# Patient Record
Sex: Male | Born: 1945 | Race: White | Hispanic: No | Marital: Single | Smoking: Never smoker
Health system: Southern US, Community
[De-identification: ages and names within clinical notes are randomized; demographics above are authoritative.]

## PROBLEM LIST (undated history)

## (undated) DIAGNOSIS — H269 Unspecified cataract: Secondary | ICD-10-CM

## (undated) DIAGNOSIS — I5032 Chronic diastolic (congestive) heart failure: Secondary | ICD-10-CM

## (undated) DIAGNOSIS — M2550 Pain in unspecified joint: Secondary | ICD-10-CM

## (undated) DIAGNOSIS — E039 Hypothyroidism, unspecified: Secondary | ICD-10-CM

## (undated) DIAGNOSIS — F329 Major depressive disorder, single episode, unspecified: Secondary | ICD-10-CM

## (undated) DIAGNOSIS — I480 Paroxysmal atrial fibrillation: Secondary | ICD-10-CM

## (undated) DIAGNOSIS — K5790 Diverticulosis of intestine, part unspecified, without perforation or abscess without bleeding: Secondary | ICD-10-CM

## (undated) DIAGNOSIS — I2510 Atherosclerotic heart disease of native coronary artery without angina pectoris: Secondary | ICD-10-CM

## (undated) DIAGNOSIS — K5792 Diverticulitis of intestine, part unspecified, without perforation or abscess without bleeding: Secondary | ICD-10-CM

## (undated) DIAGNOSIS — Z86010 Personal history of colonic polyps: Secondary | ICD-10-CM

## (undated) DIAGNOSIS — E785 Hyperlipidemia, unspecified: Secondary | ICD-10-CM

## (undated) DIAGNOSIS — K209 Esophagitis, unspecified without bleeding: Secondary | ICD-10-CM

## (undated) DIAGNOSIS — G4700 Insomnia, unspecified: Secondary | ICD-10-CM

## (undated) DIAGNOSIS — Z860101 Personal history of adenomatous and serrated colon polyps: Secondary | ICD-10-CM

## (undated) DIAGNOSIS — Z8673 Personal history of transient ischemic attack (TIA), and cerebral infarction without residual deficits: Secondary | ICD-10-CM

## (undated) DIAGNOSIS — I10 Essential (primary) hypertension: Secondary | ICD-10-CM

## (undated) DIAGNOSIS — K259 Gastric ulcer, unspecified as acute or chronic, without hemorrhage or perforation: Secondary | ICD-10-CM

## (undated) DIAGNOSIS — R7303 Prediabetes: Secondary | ICD-10-CM

## (undated) DIAGNOSIS — N400 Enlarged prostate without lower urinary tract symptoms: Secondary | ICD-10-CM

## (undated) DIAGNOSIS — M2540 Effusion, unspecified joint: Secondary | ICD-10-CM

## (undated) DIAGNOSIS — K219 Gastro-esophageal reflux disease without esophagitis: Secondary | ICD-10-CM

## (undated) DIAGNOSIS — F419 Anxiety disorder, unspecified: Secondary | ICD-10-CM

## (undated) DIAGNOSIS — G4733 Obstructive sleep apnea (adult) (pediatric): Secondary | ICD-10-CM

## (undated) DIAGNOSIS — M1990 Unspecified osteoarthritis, unspecified site: Secondary | ICD-10-CM

## (undated) DIAGNOSIS — F32A Depression, unspecified: Secondary | ICD-10-CM

## (undated) HISTORY — PX: CARDIAC CATHETERIZATION: SHX172

## (undated) HISTORY — DX: Esophagitis, unspecified: K20.9

## (undated) HISTORY — DX: Personal history of adenomatous and serrated colon polyps: Z86.0101

## (undated) HISTORY — PX: EYE SURGERY: SHX253

## (undated) HISTORY — DX: Unspecified cataract: H26.9

## (undated) HISTORY — PX: CERVICAL SPINE SURGERY: SHX589

## (undated) HISTORY — DX: Personal history of transient ischemic attack (TIA), and cerebral infarction without residual deficits: Z86.73

## (undated) HISTORY — PX: TONSILLECTOMY: SUR1361

## (undated) HISTORY — PX: FOREIGN BODY REMOVAL ESOPHAGEAL: SHX5322

## (undated) HISTORY — DX: Major depressive disorder, single episode, unspecified: F32.9

## (undated) HISTORY — PX: OTHER SURGICAL HISTORY: SHX169

## (undated) HISTORY — DX: Gastric ulcer, unspecified as acute or chronic, without hemorrhage or perforation: K25.9

## (undated) HISTORY — DX: Diverticulosis of intestine, part unspecified, without perforation or abscess without bleeding: K57.90

## (undated) HISTORY — DX: Hyperlipidemia, unspecified: E78.5

## (undated) HISTORY — PX: POLYPECTOMY: SHX149

## (undated) HISTORY — DX: Esophagitis, unspecified without bleeding: K20.90

## (undated) HISTORY — PX: KNEE SURGERY: SHX244

## (undated) HISTORY — DX: Diverticulitis of intestine, part unspecified, without perforation or abscess without bleeding: K57.92

## (undated) HISTORY — DX: Depression, unspecified: F32.A

## (undated) HISTORY — PX: COLONOSCOPY: SHX174

## (undated) HISTORY — DX: Personal history of colonic polyps: Z86.010

## (undated) HISTORY — PX: NASAL SINUS SURGERY: SHX719

---

## 1995-12-08 ENCOUNTER — Encounter: Payer: Self-pay | Admitting: Internal Medicine

## 1998-02-23 ENCOUNTER — Encounter: Payer: Self-pay | Admitting: Internal Medicine

## 1998-02-27 ENCOUNTER — Other Ambulatory Visit: Admission: RE | Admit: 1998-02-27 | Discharge: 1998-02-27 | Payer: Self-pay | Admitting: Internal Medicine

## 1998-02-27 ENCOUNTER — Encounter: Payer: Self-pay | Admitting: Internal Medicine

## 1998-04-02 ENCOUNTER — Ambulatory Visit (HOSPITAL_COMMUNITY): Admission: RE | Admit: 1998-04-02 | Discharge: 1998-04-02 | Payer: Self-pay | Admitting: *Deleted

## 2001-03-06 ENCOUNTER — Encounter: Admission: RE | Admit: 2001-03-06 | Discharge: 2001-03-06 | Payer: Self-pay | Admitting: Otolaryngology

## 2001-03-06 ENCOUNTER — Encounter: Payer: Self-pay | Admitting: Otolaryngology

## 2001-04-04 ENCOUNTER — Emergency Department (HOSPITAL_COMMUNITY): Admission: EM | Admit: 2001-04-04 | Discharge: 2001-04-04 | Payer: Self-pay

## 2001-06-18 ENCOUNTER — Ambulatory Visit (HOSPITAL_BASED_OUTPATIENT_CLINIC_OR_DEPARTMENT_OTHER): Admission: RE | Admit: 2001-06-18 | Discharge: 2001-06-18 | Payer: Self-pay | Admitting: Orthopedic Surgery

## 2001-12-17 ENCOUNTER — Ambulatory Visit (HOSPITAL_BASED_OUTPATIENT_CLINIC_OR_DEPARTMENT_OTHER): Admission: RE | Admit: 2001-12-17 | Discharge: 2001-12-17 | Payer: Self-pay | Admitting: Surgery

## 2002-09-04 ENCOUNTER — Ambulatory Visit (HOSPITAL_COMMUNITY): Admission: RE | Admit: 2002-09-04 | Discharge: 2002-09-04 | Payer: Self-pay | Admitting: Specialist

## 2002-10-30 ENCOUNTER — Encounter: Payer: Self-pay | Admitting: Internal Medicine

## 2003-03-07 ENCOUNTER — Inpatient Hospital Stay (HOSPITAL_COMMUNITY): Admission: RE | Admit: 2003-03-07 | Discharge: 2003-03-09 | Payer: Self-pay | Admitting: Neurosurgery

## 2003-08-14 ENCOUNTER — Encounter: Payer: Self-pay | Admitting: Internal Medicine

## 2003-08-14 ENCOUNTER — Inpatient Hospital Stay (HOSPITAL_COMMUNITY): Admission: EM | Admit: 2003-08-14 | Discharge: 2003-08-17 | Payer: Self-pay | Admitting: Internal Medicine

## 2003-08-14 ENCOUNTER — Encounter (INDEPENDENT_AMBULATORY_CARE_PROVIDER_SITE_OTHER): Payer: Self-pay | Admitting: *Deleted

## 2003-09-09 ENCOUNTER — Encounter (INDEPENDENT_AMBULATORY_CARE_PROVIDER_SITE_OTHER): Payer: Self-pay | Admitting: *Deleted

## 2003-09-24 ENCOUNTER — Ambulatory Visit (HOSPITAL_COMMUNITY): Admission: RE | Admit: 2003-09-24 | Discharge: 2003-09-24 | Payer: Self-pay | Admitting: Specialist

## 2003-10-23 ENCOUNTER — Encounter: Payer: Self-pay | Admitting: Internal Medicine

## 2005-02-05 ENCOUNTER — Encounter: Payer: Self-pay | Admitting: Internal Medicine

## 2005-06-23 ENCOUNTER — Emergency Department (HOSPITAL_COMMUNITY): Admission: EM | Admit: 2005-06-23 | Discharge: 2005-06-23 | Payer: Self-pay | Admitting: Emergency Medicine

## 2006-03-14 ENCOUNTER — Ambulatory Visit: Payer: Self-pay | Admitting: Internal Medicine

## 2006-03-28 ENCOUNTER — Encounter (INDEPENDENT_AMBULATORY_CARE_PROVIDER_SITE_OTHER): Payer: Self-pay | Admitting: Specialist

## 2006-03-28 ENCOUNTER — Ambulatory Visit: Payer: Self-pay | Admitting: Internal Medicine

## 2006-03-28 ENCOUNTER — Encounter (INDEPENDENT_AMBULATORY_CARE_PROVIDER_SITE_OTHER): Payer: Self-pay | Admitting: *Deleted

## 2007-01-28 ENCOUNTER — Observation Stay (HOSPITAL_COMMUNITY): Admission: EM | Admit: 2007-01-28 | Discharge: 2007-01-29 | Payer: Self-pay | Admitting: Emergency Medicine

## 2007-01-29 ENCOUNTER — Encounter (INDEPENDENT_AMBULATORY_CARE_PROVIDER_SITE_OTHER): Payer: Self-pay | Admitting: Neurology

## 2007-06-11 ENCOUNTER — Telehealth: Payer: Self-pay | Admitting: Internal Medicine

## 2008-01-26 ENCOUNTER — Telehealth: Payer: Self-pay | Admitting: Gastroenterology

## 2008-11-03 ENCOUNTER — Telehealth: Payer: Self-pay | Admitting: Internal Medicine

## 2008-11-12 ENCOUNTER — Telehealth: Payer: Self-pay | Admitting: Internal Medicine

## 2008-11-13 ENCOUNTER — Ambulatory Visit: Payer: Self-pay | Admitting: Internal Medicine

## 2008-11-13 DIAGNOSIS — K5732 Diverticulitis of large intestine without perforation or abscess without bleeding: Secondary | ICD-10-CM | POA: Insufficient documentation

## 2008-11-13 DIAGNOSIS — Z860100 Personal history of colon polyps, unspecified: Secondary | ICD-10-CM | POA: Insufficient documentation

## 2008-11-13 DIAGNOSIS — R109 Unspecified abdominal pain: Secondary | ICD-10-CM | POA: Insufficient documentation

## 2008-11-13 DIAGNOSIS — K921 Melena: Secondary | ICD-10-CM | POA: Insufficient documentation

## 2008-11-14 ENCOUNTER — Encounter: Payer: Self-pay | Admitting: Internal Medicine

## 2008-11-14 ENCOUNTER — Ambulatory Visit: Payer: Self-pay | Admitting: Internal Medicine

## 2008-11-14 DIAGNOSIS — K2970 Gastritis, unspecified, without bleeding: Secondary | ICD-10-CM | POA: Insufficient documentation

## 2008-11-14 DIAGNOSIS — K2990 Gastroduodenitis, unspecified, without bleeding: Secondary | ICD-10-CM

## 2008-11-14 LAB — CONVERTED CEMR LAB: UREASE: NEGATIVE

## 2008-11-19 ENCOUNTER — Telehealth (INDEPENDENT_AMBULATORY_CARE_PROVIDER_SITE_OTHER): Payer: Self-pay | Admitting: *Deleted

## 2009-01-14 ENCOUNTER — Ambulatory Visit: Payer: Self-pay | Admitting: Internal Medicine

## 2009-01-14 DIAGNOSIS — K259 Gastric ulcer, unspecified as acute or chronic, without hemorrhage or perforation: Secondary | ICD-10-CM | POA: Insufficient documentation

## 2009-01-14 DIAGNOSIS — K219 Gastro-esophageal reflux disease without esophagitis: Secondary | ICD-10-CM | POA: Insufficient documentation

## 2009-03-06 ENCOUNTER — Telehealth: Payer: Self-pay | Admitting: Internal Medicine

## 2009-03-17 ENCOUNTER — Encounter (INDEPENDENT_AMBULATORY_CARE_PROVIDER_SITE_OTHER): Payer: Self-pay | Admitting: *Deleted

## 2009-09-30 ENCOUNTER — Encounter (INDEPENDENT_AMBULATORY_CARE_PROVIDER_SITE_OTHER): Payer: Self-pay | Admitting: *Deleted

## 2009-10-14 ENCOUNTER — Encounter (INDEPENDENT_AMBULATORY_CARE_PROVIDER_SITE_OTHER): Payer: Self-pay | Admitting: *Deleted

## 2009-10-15 ENCOUNTER — Ambulatory Visit: Payer: Self-pay | Admitting: Internal Medicine

## 2009-10-23 ENCOUNTER — Ambulatory Visit: Payer: Self-pay | Admitting: Internal Medicine

## 2009-10-27 ENCOUNTER — Encounter: Payer: Self-pay | Admitting: Internal Medicine

## 2010-02-17 ENCOUNTER — Telehealth: Payer: Self-pay | Admitting: Internal Medicine

## 2010-03-11 NOTE — Letter (Signed)
Summary: [facility] Instructions  [facility]  [address] [place], [place] [address]   Phone: [phone]  Fax: [phone]       Carl [name]    1945/08/10    MRN: [id number]        Procedure Day /Date:  Friday 10/23/2009     Arrival Time:  8:00 am      Procedure Time: 9:00 am     Location of Procedure:                    _x _  [facility] (4th Floor)                        PREPARATION FOR COLONOSCOPY WITH MOVIPREP   Starting 5 days prior to your procedure Sunday 9/11 do not eat nuts, seeds, popcorn, corn, beans, peas,  salads, or any raw vegetables.  Do not take any fiber supplements (e.g. Metamucil, Citrucel, and Benefiber).  THE DAY BEFORE YOUR PROCEDURE         DATE: Thursday 9/15  1.  Drink clear liquids the entire day-NO SOLID FOOD  2.  Do not drink anything colored red or purple.  Avoid juices with pulp.  No orange juice.  3.  Drink at least 64 oz. (8 glasses) of fluid/clear liquids during the day to prevent dehydration and help the prep work efficiently.  CLEAR LIQUIDS INCLUDE: Water Jello Ice Popsicles Tea (sugar ok, no milk/cream) Powdered fruit flavored drinks Coffee (sugar ok, no milk/cream) Gatorade Juice: apple, white grape, white cranberry  Lemonade Clear bullion, consomm, broth Carbonated beverages (any kind) Strained chicken noodle soup Hard Candy                             4.  In the morning, mix first dose of MoviPrep solution:    Empty 1 Pouch A and 1 Pouch B into the disposable container    Add lukewarm drinking water to the top line of the container. Mix to dissolve    Refrigerate (mixed solution should be used within 24 hrs)  5.  Begin drinking the prep at 5:00 p.m. The MoviPrep container is divided by 4 marks.   Every 15 minutes drink the solution down to the next mark (approximately 8 oz) until the full liter is complete.   6.  Follow completed prep with 16 oz of clear liquid of your choice (Nothing red  or purple).  Continue to drink clear liquids until bedtime.  7.  Before going to bed, mix second dose of MoviPrep solution:    Empty 1 Pouch A and 1 Pouch B into the disposable container    Add lukewarm drinking water to the top line of the container. Mix to dissolve    Refrigerate  THE DAY OF YOUR PROCEDURE      DATE: Friday 9/16  Beginning at 4:00 a.m. (5 hours before procedure):         1. Every 15 minutes, drink the solution down to the next mark (approx 8 oz) until the full liter is complete.  2. Follow completed prep with 16 oz. of clear liquid of your choice.    3. You may drink clear liquids until 7:00 am (2 HOURS BEFORE PROCEDURE).   MEDICATION INSTRUCTIONS  Unless otherwise instructed, you should take regular prescription medications with a small sip of water   as early as possible the morning  of your procedure.          OTHER INSTRUCTIONS  You will need a responsible adult at least 66 years of age to accompany you and drive you home.   This person must remain in the waiting room during your procedure.  Wear loose fitting clothing that is easily removed.  Leave jewelry and other valuables at home.  However, you may wish to bring a book to read or  an iPod/MP3 player to listen to music as you wait for your procedure to start.  Remove all body piercing jewelry and leave at home.  Total time from sign-in until discharge is approximately 2-3 hours.  You should go home directly after your procedure and rest.  You can resume normal activities the  day after your procedure.  The day of your procedure you should not:   Drive   Make legal decisions   Operate machinery   Drink alcohol   Return to work  You will receive specific instructions about eating, activities and medications before you leave.    The above instructions have been reviewed and explained to me by   Ezra Sites RN  October 15, 2009 5:06 PM     I fully understand and can  verbalize these instructions _____________________________ Date _________

## 2010-03-11 NOTE — Letter (Signed)
Summary: Previsit letter  [facility]  [address] [place], [place] [address]   Phone: [phone]  Fax: [phone]       09/30/2009 MRN: [id number]  Carl [name] [address] [place], [place]  [address]                                                     Procedure Date: 10/23/2009   Dear [name],  Welcome to the Gastroenterology Division at [facility].    You are scheduled to see a nurse for your pre-procedure visit on 10/15/2009 at 4:30pm on the 3rd floor at [place], [address]  We ask that you try to arrive at our office 15 minutes prior to your appointment time to allow for check-in.  Your nurse visit will consist of discussing your medical and surgical history, your immediate family medical history, and your medications.    Please bring a complete list of all your medications or, if you prefer, bring the medication bottles and we will list them.  We will need to be aware of both prescribed and over the counter drugs.  We will need to know exact dosage information as well.  If you are on blood thinners (Coumadin, Plavix, Aggrenox, Ticlid, etc.) please call our office today/prior to your appointment, as we need to consult with your physician about holding your medication.   Please be prepared to read and sign documents such as consent forms, a financial agreement, and acknowledgement forms.  If necessary, and with your consent, a friend or relative is welcome to sit-in on the nurse visit with you.  Please bring your insurance card so that we may make a copy of it.  If your insurance requires a referral to see a specialist, please bring your referral form from your primary care physician.  No co-pay is required for this nurse visit.     If you cannot keep your appointment, please call [phone] to cancel or reschedule prior to your appointment date.  This allows [place] the opportunity to schedule an appointment for another patient in need of care.     Thank you for choosing [facility] for your medical needs.  We appreciate the opportunity to care for you.  Please visit [place] at our website  to learn more about our practice.                     Sincerely.                                                                                                                   The Gastroenterology Division

## 2010-03-11 NOTE — Progress Notes (Signed)
Summary: Divertic.. flare  Phone Note Call from Patient Call back at Home Phone [phone]   Caller: Patient Call For: Dr. Marina Goodell Reason for Call: Talk to Nurse Summary of Call: Pt has some concerns he would like to disucss with the nurse Initial call taken by: [place] Johnson,  February 17, 2010 9:52 AM  Follow-up for Phone Call        Pt. had L.L.Q. pain on Saturday was initially a 10 but then became dull so he started his Flagyl 500 mg b.i.d but only had enough for 5 days.He also went on clear liquids x one and a half days.Has been pain free but is asking if he should get more to complete a full course. Follow-up by: Teryl Lucy RN,  February 17, 2010 11:45 AM  Additional Follow-up for Phone Call Additional follow up Details #1::        Yes. Cipro 500 mg x 1 week and Flagyl 500mg  x 1 week. Additional Follow-up by: Hilarie Fredrickson MD,  February 17, 2010 11:53 AM    Additional Follow-up for Phone Call Additional follow up Details #2::    Message left to call back.   Teryl Lucy RN  February 17, 2010 2:06 PM Pt. ntfd. of Dr.Wenona [place]ville's orders and rx. sent to pharmacy. Follow-up by: Teryl Lucy RN,  February 17, 2010 3:08 PM  New/Updated Medications: CIPROFLOXACIN HCL 500 MG TABS (CIPROFLOXACIN HCL) Take 1 p.o.b.i.d. for i week METRONIDAZOLE 500 MG TABS (METRONIDAZOLE) Take one b.i.d. for one week PRILOSEC 20 MG CPDR (OMEPRAZOLE) Take one p.o. one half  before breakfast Prescriptions: PRILOSEC 20 MG CPDR (OMEPRAZOLE) Take one p.o. one half  before breakfast  #30 x 11   Entered by:   Teryl Lucy RN   Authorized by:   Hilarie Fredrickson MD   Signed by:   Teryl Lucy RN on 02/17/2010   Method used:   [facility] [address] [phone]* (retail)       [address]       [place], [place]  [address]       Ph: [phone] or [phone]       Fax: [phone]   RxID:   [id number] METRONIDAZOLE 500 MG TABS (METRONIDAZOLE) Take one b.i.d. for one week   #14 x 0   Entered by:   Teryl Lucy RN   Authorized by:   Hilarie Fredrickson MD   Signed by:   Teryl Lucy RN on 02/17/2010   Method used:   [facility]        [facility] [address] [phone]* (retail)       [address]       [place], [place]  [address]       Ph: [phone] or [phone]       Fax: [phone]   RxID:   [id number] CIPROFLOXACIN HCL 500 MG TABS (CIPROFLOXACIN HCL) Take 1 p.o.b.i.d. for i week  #14 x 0   Entered by:   Teryl Lucy RN   Authorized by:   Hilarie Fredrickson MD   Signed by:   Teryl Lucy RN on 02/17/2010   Method used:   [facility]        [facility] [address] [phone]* (retail)       [address]       [place]  [place], [place]  [address]       Ph: [phone] or [phone]       Fax: [phone]   RxID:   [id number]

## 2010-03-11 NOTE — Letter (Signed)
Summary: Colonoscopy Letter  [facility]  [address] [place], [place] [address]   Phone: [phone]  Fax: [phone]      March 17, 2009 MRN: [id number]   JED KUTCH [address] [place], [place]  [address]   Dear Mr. HARNISH,   According to your medical record, it is time for you to schedule a Colonoscopy. The American Cancer Society recommends this procedure as a method to detect early colon cancer. Patients with a family history of colon cancer, or a personal history of colon polyps or inflammatory bowel disease are at increased risk.  This letter has beeen generated based on the recommendations made at the time of your procedure. If you feel that in your particular situation this may no longer apply, please contact our office.  Please call our office at [phone] to schedule this appointment or to update your records at your earliest convenience.  Thank you for cooperating with [place] to provide you with the very best care possible.   Sincerely,  Wilhemina Bonito. Marina Goodell, M.D.  [facility] Gastroenterology Division [phone]

## 2010-03-11 NOTE — Letter (Signed)
Summary: Patient Notice- Polyp Results  [facility]  [address] [place], [place] [address]   Phone: [phone]  Fax: [phone]        October 27, 2009 MRN: [id number]    Carl [name] [address] [place], [place]  [address]    Dear [name],  I am pleased to inform you that the colon polyp(s) removed during your recent colonoscopy was (were) found to be benign (no cancer detected) upon pathologic examination.  I recommend you have a repeat colonoscopy examination in 5 years to look for recurrent polyps, as having colon polyps increases your risk for having recurrent polyps or even colon cancer in the future.  Should you develop new or worsening symptoms of abdominal pain, bowel habit changes or bleeding from the rectum or bowels, please schedule an evaluation with either your primary care physician or with me.  Additional information/recommendations:  __ No further action with gastroenterology is needed at this time. Please      follow-up with your primary care physician for your other healthcare      needs.    Please call us if you are having persistent problems or have questions about your condition that have not been fully answered at this time.  Sincerely,  Hilarie Fredrickson MD  This letter has been electronically signed by your physician.

## 2010-03-11 NOTE — Miscellaneous (Signed)
Summary: [facility]  Clinical Lists Changes  Medications: Added new medication of MOVIPREP 100 GM  SOLR (PEG-KCL-NACL-NASULF-NA ASC-C) As per prep instructions. - Signed Rx of MOVIPREP 100 GM  SOLR (PEG-KCL-NACL-NASULF-NA ASC-C) As per prep instructions.;  #1 x 0;  Signed;  Entered by: Ezra Sites RN;  Authorized by: Hilarie Fredrickson MD;  Method used: [facility] [address] [phone]*, [address] [place], [place], [place]  [address], Ph: [phone] or [phone], Fax: [phone] Observations: Added new observation of NKA: T (10/15/2009 16:47)    Prescriptions: MOVIPREP 100 GM  SOLR (PEG-KCL-NACL-NASULF-NA ASC-C) As per prep instructions.  #1 x 0   Entered by:   Ezra Sites RN   Authorized by:   Hilarie Fredrickson MD   Signed by:   Ezra Sites RN on 10/15/2009   Method used:   Electronically to        [facility] [address] [phone]* (retail)       [address]       [place], [place]  [address]       Ph: [phone] or [phone]       Fax: [phone]   RxID:   [id number]

## 2010-03-11 NOTE — Procedures (Signed)
Summary: Colonoscopy  Patient: [name] Note: All result statuses are Final unless otherwise noted.  Tests: (1) Colonoscopy (COL)   COL Colonoscopy           DONE     [facility]     [address]     [place], [place]  [address]           COLONOSCOPY PROCEDURE REPORT           PATIENT:  Carl [name], Carl [name]  MR#:  [id number]     BIRTHDATE:  12/18/1945, 64 yrs. old  GENDER:  male     ENDOSCOPIST:  Wilhemina Bonito. Eda Keys, MD     REF. BY:  Surveillance Program Recall,     PROCEDURE DATE:  10/23/2009     PROCEDURE:  Colonoscopy with biopsy removal of polyps x 2,     Colonoscopy with snare polypectomy     x 1     ASA CLASS:  Class II     INDICATIONS:  history of pre-cancerous (adenomatous) colon polyps,     surveillance and high-risk screening ; index 1997 w/ f/u     2000,04,08 - multiple adenomas     MEDICATIONS:   Fentanyl 75 mcg IV, Versed 7 mg IV, Benadryl 50 mg     IV           DESCRIPTION OF PROCEDURE:   After the risks benefits and     alternatives of the procedure were thoroughly explained, informed     consent was obtained.  Digital rectal exam was performed and     revealed no abnormalities.   The [id number] endoscope was     introduced through the anus and advanced to the cecum, which was     identified by both the appendix and ileocecal valve, without     limitations.Time to cecum = 2:44 min.  The quality of the prep was     excellent, using MoviPrep.  The instrument was then slowly     withdrawn (time = 15:47 min) as the colon was fully examined.     <<PROCEDUREIMAGES>>           FINDINGS:  2 Diminutive polyps (<74mm)  were found, in the     ascending colon and descending colon. With standard forceps, cold     biopsy removal was performed and the polyps were obtained and sent     to pathology.  Also, a 5mm pedunculated polyp was found in the     rectum. This Polyp was snared without cautery. Retrieval was     successful.   Severe diverticulosis was found in the left  colon as     well as some ascending changes.   Retroflexed views in the rectum     revealed no abnormalities.    The scope was then withdrawn from     the patient and the procedure completed.           COMPLICATIONS:  None     ENDOSCOPIC IMPRESSION:     1) Diminutive polyps in the ascending and descending colon -     removed     2) Pedunculated polyp in the rectum - removed     3) Severe diverticulosis in the left colon and some ascending     changes as well           RECOMMENDATIONS:     1) Follow up colonoscopy in 5 years  ______________________________     Wilhemina Bonito. Eda Keys, MD           CC:  Catha Gosselin, MD; The Patient           n.     eSIGNED:   Wilhemina Bonito. Eda Keys at 10/23/2009 10:20 AM           Casey Burkitt, [id number]  Note: An exclamation mark (!) indicates a result that was not dispersed into the flowsheet. Document Creation Date: 10/23/2009 10:21 AM _______________________________________________________________________  (1) Order result status: Final Collection or observation date-time: 10/23/2009 10:09 Requested date-time:  Receipt date-time:  Reported date-time:  Referring Physician:   Ordering Physician: Fransico Setters [phone]) Specimen Source:  Source: Launa Grill Order Number: [id number] Lab site:   Appended Document: Colonoscopy recall     Procedures Next Due Date:    Colonoscopy: 10/2014

## 2010-03-11 NOTE — Progress Notes (Signed)
Summary: TRIAGE / diverticulitis flare  Phone Note Call from Patient Call back at [phone]   Caller: Patient Call For: Dr. Marina Goodell Reason for Call: Talk to Nurse Summary of Call: Pt. is having a diverticulitis flare and wanted to refill Flagyl, but his pharamcy said they are out of it world-wide. Initial call taken by: Vallarie Mare,  March 06, 2009 2:28 PM  Follow-up for Phone Call          I spoke with the Pharmacist and she confirmed that there is a nation wide shortage of Flagyl. I called several pharmacies and found some at  [facility]: Flagyl (generic)  500mg  two times a day for 10 days phoned in .    Pt. is c/o mild lower abd. discomfort yesterday and this morning, but states he feels better this afternoon. I have reviewed a modified diet for the weekend with him. I also encouraged him to go pick-up the Flagyl, in case he needs it he will have it.    1) Clear liquids x24 hours. Advance to soft,bland diet x2-4 days. Advanced as tolerated. 2) Take Cipro/Flagyl as directed-as needed 3) If symptoms become worse call back immediately or go to ER. 4)Call back with an update on Monday Follow-up by: Laureen Ochs LPN,  March 06, 2009 4:19 PM  Additional Follow-up for Phone Call Additional follow up Details #1::        Excellent Additional Follow-up by: Hilarie Fredrickson MD,  March 07, 2009 10:26 AM

## 2010-06-22 NOTE — H&P (Signed)
Carl [name], Carl [name]                   ACCOUNT NO.:  [id number]   MEDICAL RECORD NO.:  [id number]          PATIENT TYPE:  INP   LOCATION:  3743                         FACILITY:  [facility]   PHYSICIAN:  Michael L. Reynolds, M.D.DATE OF BIRTH:  1945/08/21   DATE OF ADMISSION:  01/28/2007  DATE OF DISCHARGE:                              HISTORY & PHYSICAL   CHIEF COMPLAINT:  Left-sided numbness.   HISTORY OF PRESENT ILLNESS:  This is the initial stroke service  admission for this 65 year old man with a past history of the past  history of with past medical history which includes hyperlipidemia, not  presently treated.  The patient reports that at about 6 p.m. this  evening, he was in the kitchen talking with family when he experienced a  sudden-onset of a numb sensation.  This numbness involved the face, in  particular, and also to some extent the arm and leg.  He did not notice  any associated motor symptoms, any slurred speech, any visual  disturbance or any unsteadiness of gait, and those around him notice any  slurring of his speech or asymmetry of his face.  He became concerned  about the symptoms and sought the advice of friends, who are physicians,  and on their advice presented to the emergency room for evaluation.  He  does state that for about the past month he has had intermittent  numbness of the left upper extremity with exercising and relates this to  history of previous spine surgery, but states that the symptoms in the  face were something clearly new and different.  He denies ever having  anything quite like that before.  He denies any previous history of  stroke and has not had any recent headache, visual change, chest pain,  shortness breath, palpitations, nausea, vomiting.   PAST MEDICAL HISTORY:  1. Prior physician treated him for hypercholesteremia for about a      year, but he actually discontinued Lipitor a few months ago.  2. History of cervical spine surgery 4-5  years ago by Dr. Channing Mutters for left      cervical radiculopathy.  He does have some residual weakness in his      left arm, although did not really have problems with sensory      symptoms until about a month ago as mentioned above.  3. Nasal allergies.  4. Herpetic infection.  5. Anxiety and depression.   MEDICATIONS:  Baby aspirin daily, Lexapro (uncertain dose) daily,  Valtrex daily, Nasacort daily, Ambien CR 12. 5 mg daily.  He has been  taking fish oil for the last 2 days.   ALLERGIES:  No known drug allergies.   FAMILY HISTORY:  He says his father had a few strokes, although his  prior problem was dementia.  No other history of stroke in the family.   SOCIAL HISTORY:  He is not a smoker.  He does consume alcohol.  He leads  a fairly active lifestyle and works as a Theatre manager.   REVIEW OF SYSTEMS:  Full 10-system review of systems is negative  except  as outlined in the HPI and in the ER and admission nursing records which  are reviewed.   PHYSICAL EXAMINATION:  VITAL SIGNS:  Temperature 97 degrees, blood  pressure 150/80, pulse 61, respirations 16, O2 saturations 98% on room  air.  GENERAL:  This is a healthy-appearing man laying supine in no distress.  HEENT:  Normocephalic, atraumatic.  Oropharynx benign.  NECK:  Supple without carotid or subclavicular bruits.  CHEST:  Clear to auscultation bilaterally.  HEART:  Regular rate and rhythm without murmurs.  NEUROLOGIC:  He is awake, alert.  He is fully oriented to and time,  place.  Recent and memory are intact.  Attention span, concentration and  fund of knowledge are all appropriate.  He has no difficulty with naming  objects or repeating phrases.  Speech is completely fluent and not at  all dysarthric.  Cranial nerves with pupils equal and  reactive.  Extraocular movements full without nystagmus.  Visual fields full to  confrontation.  Hearing is intact to conversational speech.  Facial  sensation is decreased to pinprick on  the left compared to the right.  Face, tongue and palate move normally and symmetrically.  Motor with  normal bulk and tone.  He has a little bit of weakness in the left  triceps, finger extensors and intrinsic muscles of the hand, which he  says is chronic and no different than usual.  Otherwise, normal strength  throughout.  Sensory:  He reports diminished pinprick sensation of the  left upper and lower extremities compared to the right.  Cerebellar:  Rapid movements are moving little slowly on the left.  Finger-to-nose is  performed accurately.  Gait:  He arises easily from a chair and his  stance is normal.  He is able to ambulate a little bit without  difficulty and can perform a Romberg maneuver without falling.  Reflexes  2+ and symmetric.  Toes are downgoing bilaterally.   LABORATORY DATA:  CBC with white count 7.2, hemoglobin 15.6, platelets  250,000.  Chemistries are unremarkable.  Coagulations are normal.  CT is  personally reviewed and to my eye looks normal.   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  He needs workup for  this.   PLAN:  Will admit to the hospital for quick stroke workup including MRI,  MRA, carotid and transcranial Dopplers, 2-D echocardiogram and stroke  labs.  For now, we will leave him on aspirin, although depending on the  outcome of his workup, we will likely switch him to Plavix or Aggrenox.  Stroke service to follow.      Michael L. Thad Ranger, M.D.  Electronically Signed     MLR/MEDQ  D:  01/28/2007  T:  01/29/2007  Job:  [id number]   cc:   Caryn Bee L. Little, M.D.  Payton Doughty, M.D.

## 2010-06-22 NOTE — Discharge Summary (Signed)
Carl [name], Carl [name]                   ACCOUNT NO.:  [id number]   MEDICAL RECORD NO.:  [id number]          PATIENT TYPE:  INP   LOCATION:  3743                         FACILITY:  [facility]   PHYSICIAN:  Michael L. Reynolds, M.D.DATE OF BIRTH:  07/18/1945   DATE OF ADMISSION:  01/28/2007  DATE OF DISCHARGE:  01/29/2007                               DISCHARGE SUMMARY   ADMISSION DIAGNOSES:  1. Left side sensory changes, presumed right brain subcortical stroke.  2. History of hypercholesterolemia.   DISCHARGE DIAGNOSES:  1. Transient left hemisensory symptoms, presumed right brain transient      ischemic attack with negative imaging study.  2. Mild hyperlipidemia.   CONDITION ON DISCHARGE:  Improved.   DIET:  Low salt, low fat, low cholesterol.   ACTIVITY:  Ad lib.   DISCHARGE MEDICATIONS:  1. Lipitor 10 mg daily (new).  2. Aspirin 81 mg daily.  3. Lexapro previous dose daily.  4. Valtrex previous dose daily.  5. Ambien CR 12.5 mg daily.  6. Nasonex previous dose daily.   STUDIES:  1. CT of the head performed on January 28, 2007, demonstrating no      acute abnormalities.  2. MRI of the brain performed without contrast on January 29, 2007,      and demonstrating no acute abnormalities, no real evidence of white      matter disease, essentially a normal study.  3. MRA of the intracranial vessels demonstrating no significant      stenoses.  4. MRA of the extracranial neck vessels demonstrating no significant      stenoses.  5. Carotid Doppler study performed on January 29, 2007, demonstrating      mild-to-moderate calcific and non-calcific plaque in the proximal      right ICA with no stenosis noted bilaterally.  6. Echocardiogram performed January 29, 2007, results pending at      discharge.  7. Transcranial Doppler performed on January 29, 2007, results      pending at discharge.   LABORATORY REVIEW:  A CBC, white count 7.2, hemoglobin 15.6, platelets  250,000 with  unremarkable differential.  Coags normal.  C-MET on  admission normal except for a slightly low total protein and albumin.  Lipid panel:  Total cholesterol 193, HDL 57, LDL slightly elevated at  960.  Hemoglobin A1c normal at 5.4.  Homocystine level normal at 9.  EKG  demonstrating borderline left atrial enlargement, otherwise normal.  Telemetry monitoring demonstrating a sinus rhythm throughout hospital  stay.   HOSPITAL COURSE:  Please see the admission H&P for full admission  details.  Briefly, this is a 65 year old male with a history of  hyperlipidemia who presented to the emergency department after  developing acute onset of left hemisensory changes involving the face,  arm, and leg on the evening of January 28, 2007.  Over concern of  possible a cerebrovascular event, he was admitted for a brief stroke  workup.  The results of the workup are as above.  It was felt that his  primary risk factor was his hyperlipidemia along with somewhat  borderline blood pressures which were running 130s to 150s systolic in  the hospital.  He was advised to resume his Lipitor and to monitor his  blood pressure closely.  By the morning of January 29, 2007, his  symptoms had resolved.  Because of his negative imaging study and other  workup, he was felt stable for discharge on January 29, 2007.   FOLLOWUP:  He was asked to follow up with his primary physician, Dr.  Catha Gosselin in a few weeks.  He will follow up at [facility] as needed.      Michael L. Thad Ranger, M.D.  Electronically Signed     MLR/MEDQ  D:  01/29/2007  T:  01/29/2007  Job:  [id number]   cc:   Caryn Bee L. Little, M.D.  Payton Doughty, M.D.

## 2010-06-25 NOTE — H&P (Signed)
NAMEINDY, Carl [name]                             ACCOUNT NO.:  [id number]   MEDICAL RECORD NO.:  [id number]                   PATIENT TYPE:  INP   LOCATION:  3033                                 FACILITY:  [facility]   PHYSICIAN:  Payton Doughty, M.D.                   DATE OF BIRTH:  Aug 31, 1945   DATE OF ADMISSION:  03/07/2003  DATE OF DISCHARGE:                                HISTORY & PHYSICAL   ADMISSION DIAGNOSIS:  Cervical spondylosis at C4-5 and C5-6.  Herniated disk  at C6-7.   BODY OF TEXT:  This is a 65 year old right-handed white gentleman 10 days  ago had a lot of neck, left shoulder, and arm pain.  Progressed.  The pain  has largely resolved and is replaced with numbness and weakness in his left  arm where he cannot extend the fingers of his left hand and left wrist and  has weakness of the triceps.  He was sent to me.  MR showed a herniated disk  at C6-7 to the left and spondylitis disease at C4-5 and C5- 6.  He is now  admitted for an anterior cervical facetectomy and fusion.   PAST MEDICAL HISTORY:  Otherwise benign.   He is not on any medicines.   Does not have any allergies.   PAST SURGICAL HISTORY:  A couple of knee operations and a sinus operation by  Dr. Jearld Fenton.   FAMILY HISTORY:  Mom is 22.  Dad is 60.  Both in fair health.   SOCIAL HISTORY:  Does not smoke.  Is a social drinker.  Is a Proofreader.   REVIEW OF SYSTEMS:  Remarkable for neck pain, shoulder pain, arm pain,  occasional sinus difficulties.   PHYSICAL EXAMINATION:  HEENT:  Within normal limits.  NECK:  He has reasonable range of motion of his neck.  LUNGS:  Clear.  HEART:  Regular rate and rhythm.  ABDOMEN:  Nontender.  No hepatosplenomegaly.  EXTREMITIES:  Without clubbing or cyanosis.  Peripheral pulses are good.  GU:  Deferred.  NEUROLOGIC:  He is awake, alert and oriented.  Cranial nerves are intact.  Motor exam showed 5/5 throughout the right upper extremity.  Left upper  extremity has 4/5  weakness in his triceps, 3/5 weakness in wrist extensors,  2/5 weakness in the fingers, extensors, digits 2-4.  Triceps reflex is  absent on the left, and he is nonmyelopathic.   He comes accompanied with an MR that shows disk and spur in 6-7, eccentric  left compression in C7 in the neural foramen.  C5-6 and 4-5 are somewhat  degenerated with biforaminal narrowing.   CLINICAL IMPRESSION:  Left C7 radiculopathy related to disk and spur at C6-  7.   The plan is for anterior cervical decompression and fusion at C4-5, C5-6,  and C6-7.  No significant degenerative disease in 4-5 and 5-6 and 6-7.  To  do 6-7 by itself would commit him to further operations.  The plan is for  all three levels at one time.  The risks and benefits of this approach have  been discussed with him, and he wishes to proceed.                                                Payton Doughty, M.D.    MWR/MEDQ  D:  03/07/2003  T:  03/08/2003  Job:  [id number]

## 2010-06-25 NOTE — Op Note (Signed)
NAMELJ, MIYAMOTO                             ACCOUNT NO.:  [id number]   MEDICAL RECORD NO.:  [id number]                   PATIENT TYPE:  INP   LOCATION:  2899                                 FACILITY:  [facility]   PHYSICIAN:  Payton Doughty, M.D.                   DATE OF BIRTH:  [birth year]   DATE OF PROCEDURE:  03/07/2003  DATE OF DISCHARGE:                                 OPERATIVE REPORT   PREOPERATIVE DIAGNOSIS:  Left C7 radiculopathy with spondylosis and disc at  C6-C7, spondylosis at C4-C5 and C5-C6.   POSTOPERATIVE DIAGNOSIS:  Left C7 radiculopathy with spondylosis and disc at  C6-C7, spondylosis at C4-C5 and C5-C6.   OPERATIVE PROCEDURE:  C4-C5, C5-C6, and C6-C7 anterior cervical  decompression and fusion with a tethered plate.   SURGEON:  Payton Doughty, M.D.   SERVICE:  Neurosurgery   ANESTHESIA:  General endotracheal anesthesia.   PREPARATION:  Betadine and alcohol wipe.   COMPLICATIONS:  None.   ASSISTANT:  Nurse assistant [facility]  Doctor assistant Hilda Lias, M.D.   BODY OF TEXT:  This is a 65 year old gentleman with severe left C7  radiculopathy.  He is taken to the operating room, smoothly anesthetized and  intubated, placed supine on the operating table.  Following shave, prep, and  drape in the usual sterile fashion, the skin was incised starting 1  fingerbreadth below the level of the carotid tubercle and extending  approximately 5 cm in a cephalic direction paralleling the  sternocleidomastoid muscle.  The platysma was identified, elevated, divided,  and undermined.  The sternocleidomastoid muscle was identified and  dissection revealed the carotid artery retracted laterally to the left,  trachea and esophagus retracted laterally to the right, exposing the bones  of the anterior cervical spine.  A marker was placed and interoperative x-  ray was obtained.  Having confirmed the correct level, the longus colli was  taken down over C4, C5, C6, and C7.  This  was done bilaterally and a self-  retaining retractor was placed.  Discectomy was carried out at C4-C5, C5-C6,  and C6-C7 under gross observation.  The operating microscope was then  brought in and we used microdissection technique to complete removal of the  disc, remove the posterior longitudinal ligament, dissect the anterior  epidural space, and decompress the nerve roots.  At C4-C5 and C5-C6, there  was central disc with lateral spurring causing neural foraminal narrowing  bilaterally.  It was worse at 5-6 than at 4-5.  At C6-C7,  there was severe  compression at the left C7 nerve root by a combination of disc and  osteophyte.  This resulted in extensive bruising and contusion of the left  C7 root.  As the root was decompressed, it became markedly suffused with  blood and somewhat of an angry appearing red.  Having completed  decompression, 7 mm bone  grafts were fashioned with patellar allograft and  tapped into place.  A three level tethered plate was then attached with 13  mm screws, two in C4, one in C5, one in C6, and two in C7.  Interoperative x-  ray showed good placement of bone graft, plate, and screws.  The wound was  irrigated and hemostasis assured.  The platysma was reapproximated with 3-0  Vicryl in an interrupted fashion, the subcutaneous tissue were  reapproximated with 3-0 Vicryl in an interrupted fashion, the skin was  closed with 4-0 Vicryl in a running subcuticular fashion.  Benzoin and Steri-  Strips were placed and made occlusive with Telfa and OpSite.  The patient  returned to the recovery room in good condition.                                               Payton Doughty, M.D.    MWR/MEDQ  D:  03/07/2003  T:  03/07/2003  Job:  [id number]

## 2010-06-25 NOTE — Op Note (Signed)
[place]  Patient:    DEMONTA, Carl [name] Visit Number: [id number] MRN: [id number]          Service Type: DSU Location: [facility] Attending Physician:  Twana First Dictated by:   Elana Alm Thurston Hole, M.D. Proc. 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  OPERATION/PROCEDURE:  1. Left knee examination under anesthesia followed by arthroscopic partial     medial and lateral meniscectomy.  2. Left knee loose body excisions.  3. Left knee chondroplasty.  SURGEON: Elana Alm. Thurston Hole, M.D.  ASSISTANT: Carl [name], P.A.  ANESTHESIA: Local and MAC.  OPERATIVE TIME: Forty-five minutes.  COMPLICATIONS: None.  INDICATIONS FOR PROCEDURE: Carl [name] is a 65 year old gentleman, who has had significant left knee pain with intermittent locking and catching for the past three to four months, increasing in nature, with signs and symptoms and x-rays documenting loose bodies and chondromalacia and early DJD, who has failed conservative care and is now to undergo arthroscopy.  DESCRIPTION: Carl [name] was brought to the operating room on Jun 18, 2001 after a block had been placed in the holding room.  He was placed on the operative table in the supine position.  His left knee was examined under anesthesia. Range of motion from -5 to 125 degrees, 2+ effusion.  Knee stable ligamentous examination with normal patella tracking.  Left leg was prepped using sterile Betadine and draped using sterile technique.  Originally through an inferolateral portal the arthroscope was a pump attached was placed and through an inferomedial portal an arthroscopic probe was placed.  He received Ancef 1 g IV preoperatively for  prophylaxis.  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  He had had a previous partial meniscectomy and he had further meniscal tearing of 30-40% of the remnant of the posterior and medial horn, which was resected. Approximately 30% of the posterior medial horn remained intact.  The intercondylar notch was inspected and the anterior and posterior cruciate ligaments were normal.  The lateral compartment was inspected, grade II and 30-40% grade III chondromalacia, which was debrided.  Lateral meniscal tearing posterior and lateral horn 25%, which was resected back to a stable rim.  The patellofemoral joint showed grade III chondromalacia over 75 of the patella and the femoral groove, which was debrided.  The patella tracked normally. There were osteophytes on the inferior pole of the patella, which were not resected.  There were four significant loose bodies floating in the suprapatellar pouch and medial and lateral gutters, and these were all removed.  Each of these measured 1 x 1.5 cm.  Osteophytes on the femoral condyles were well fixed and these were not removed.  After this was done it was felt that all pathology had been satisfactorily addressed.  The instruments were removed.  Portals were closed with 3-0 nylon suture and injected with 0.25% Marcaine with epinephrine and 4 mg of morphine.  A sterile dressing was applied and the patient awakened and taken to the recovery room in stable condition.  FOLLOW-UP CARE: Carl [name] will be followed as  an outpatient, on Vicodin and Naprosyn.  See me back in the office in a week for sutures out and follow-up. Dictated by:   Elana Alm Thurston Hole, M.D. Attending Physician:  Twana First DD:  06/18/01 TD:  06/19/01 Job: [id number]

## 2010-06-25 NOTE — Discharge Summary (Signed)
NAMEEBUBECHUKWU, JEDLICKA                             ACCOUNT NO.:  [id number]   MEDICAL RECORD NO.:  [id number]                   PATIENT TYPE:  INP   LOCATION:  0442                                 FACILITY:  [facility]   PHYSICIAN:  Judie Petit T. Russella Dar, M.D. [facility]          DATE OF BIRTH:  February 20, 1945   DATE OF ADMISSION:  08/14/2003  DATE OF DISCHARGE:  08/17/2003                                 DISCHARGE SUMMARY   ADMITTING DIAGNOSES:  21. A 65 year old male with acute lower abdominal pain x24 hours, suspect     acute diverticulitis, rule out perforation.  2. History of colon polyps.  3. Status post cervical disk decompression and fusion.  4. Status post left knee arthroscopy.   DISCHARGE DIAGNOSES:  1. Improving acute diverticulitis with microperforation and small associated     abscess.  2. History of colon polyps.  3. Status post cervical disk decompression and fusion.  4. Status post left knee arthroscopy.   CONSULTS:  None.   PROCEDURE:  CT scan of the abdomen and pelvis.   BRIEF HISTORY:  Mr. Engram is a pleasant 65 year old white male primary  patient of Dr. Idell Pickles, known to Dr. Marina Goodell with history of colon polyps and  left-sided diverticulosis.  He last had colonoscopy in September of 2004.  He has not had any history of diverticulitis.  At this time he presented  after vacationing in [place] for a week with onset of lower abdominal  cramping and diarrhea which started the day prior to admission.  He did have  some associated nausea, no vomiting, fever, or chills, and only had one  episode of diarrhea.  Since that time he had developed rather acute lower  abdominal pain which has progressed over the past 24 hours and is primarily  located in the left lower quadrant.  There was evidence for peritoneal  inflammation on examination and he also had complaints of discomfort with  walking, riding in the car, etc.  He was seen by Dr. Marina Goodell acutely, felt to  be exquisitely tender in the left  lower quadrant with guarding and rebound.  Blood pressure was a bit low at 98/62.  He was afebrile.  It was felt that  admission to the hospital was warranted with probable acute diverticulitis  for IV antibiotics and further diagnostic evaluation with CT scan of the  abdomen and pelvis to assess and also rule out perforation.   LABORATORIES:  On admission WBC 14.7, hemoglobin 15.8, hematocrit 44.8, MCV  93, platelets 269.  Serial values were obtained.  On July 9 WBC was 11.2,  hemoglobin 13.3, hematocrit 38.9.  Electrolytes within normal limits with  glucose 100 on admission, BUN 11, creatinine 1.0, albumin 3.5.  Liver  function studies normal.  Amylase 42, lipase 17.  UA was negative.   X-ray studies:  CT scan of the abdomen and pelvis with contrast on August 14, 2003  showed acute diverticulitis of the proximal sigmoid portion of the  colon with numerous proximal sigmoid diverticulitis and evidence of  microperforation with at least two air bubbles in the soft tissues adjacent  to the colon.  There was suggestion of a 2.4 cm abscess in the soft tissues  just anterior to the left psoas.  Also, extensive pericolonic soft tissue  inflammation.  Normal retrocecal appendix, otherwise negative study.   HOSPITAL COURSE:  The patient was admitted to the service of Dr. Yancey Flemings.  He was started on IV fluids, given Demerol and Phenergan for control of pain  and nausea, and started on IV Unasyn.  Baseline laboratories were obtained  and he was scheduled for CT scan of the abdomen and pelvis urgently.  CT  scan was consistent with proximal sigmoid diverticulitis and  microperforation with associated 2.4 cm abscess.  By the following day he  was feeling better with decreased pain, was afebrile, and continued on clear  liquids.  He steadily improved.  His diet was gradually advanced.  He self  discontinued the pain medication and by July 10 was feeling much better, had  had a normal stool, and was  tolerating low roughage diet.  Decision was made  to allow him discharge to home with instructions to follow up with Dr. Marina Goodell  in two weeks and to call for any problems in the interim with recurrent  fever, worsening pain, etc.  He was to maintain a low residue diet for seven  days and then regular thereafter and to complete a course of Augmentin 875  b.i.d. and metronidazole 500 t.i.d. x10 days.   CONDITION ON DISCHARGE:  Improved.     Amy Esterwood, P.A.-C. [facility]                Malcolm T. Russella Dar, M.D. [facility]    AE/MEDQ  D:  09/09/2003  T:  09/09/2003  Job:  [id number]   cc:   Dellis Anes. Idell Pickles, M.D.  [address]  [place]  [place] [address]  Fax: [phone]

## 2010-06-25 NOTE — H&P (Signed)
Carl [name], Carl [name]                             ACCOUNT NO.:  [id number]   MEDICAL RECORD NO.:  [id number]                   PATIENT TYPE:  INP   LOCATION:  0442                                 [facility]ACILITY:  [facility]   PHYSICIAN:  Carl Bonito. Marina [name], M.D. [facility]             DATE OF BIRTH:  Jul 28, 1945   DATE OF ADMISSION:  08/14/2003  DATE OF DISCHARGE:                                HISTORY & PHYSICAL   CHIEF COMPLAINT:  Acute lower abdominal pain x24 hours.   HISTORY:  Carl [name] is a 65 year old white male, a primary patient of Carl Anes.  Heller, M.D., who is known to Dr. Marina [name] with history of colon polyps and  left-sided diverticulosis.  He last underwent colonoscopy in September 2004.  He has not had any previous difficulty with diverticulitis.  At this time he  presents after vacationing in [place] a week or so ago with onset of lower  abdominal cramping and diarrhea on the day prior to admission.  He did have  some associated nausea, no vomiting, no fever or chills, and had really only  one episode of diarrhea, which was nonbloody.  He had come to our office  yesterday afternoon and was added on as a work-in today, given Levsin, and  put on a clear liquid diet in the interim.  He has had progressive lower  abdominal pain, primarily to the left of midline, and evidence for  peritoneal inflammation with complaints of discomfort with walking,  jiggling, etc.  He was seen by Dr. Marina [name], felt to be acutely tender in the  left lower quadrant with guarding and rebound.  He did have bowel sounds,  was afebrile, blood pressure 98/62.  He is admitted to the hospital with  probable acute diverticulitis for IV antibiotics and further diagnostic  evaluation with CT scan of the abdomen and pelvis and pain control.   CURRENT MEDICATIONS:  None on a regular basis.   ALLERGIES:  No known drug allergies.   PAST HISTORY:  1. Pertinent for colon polyps, left-sided diverticular disease.  2. Prior left knee  arthroscopy.  3. Cervical disk decompression and fusion January 2005.   SOCIAL HISTORY:  The patient is married.  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  No ETOH and no tobacco.   FAMILY HISTORY:  Negative for colon cancer or polyps.  No GI disease that he  is aware of.   REVIEW OF SYSTEMS:  Reviewed and otherwise negative except for GI as above.   PHYSICAL EXAMINATION (PER DR. Yancey [name]):  GENERAL:  The patient is a well-  developed white male in no acute distress, having some difficulty ambulating  secondary to abdominal discomfort.  VITAL SIGNS:  Weight is 172.  Temperature 98.9, blood pressure 98/62, pulse  of 78.  HEENT:  Nontraumatic, normocephalic, EOMI, PERRLA.  Sclerae anicteric.  NECK:  Supple without nodes.  CARDIOVASCULAR:  Regular  rate and rhythm with S1 and S2.  CHEST:  Clear to A&P.  ABDOMEN:  Soft.  He is tender in the left lower quadrant with guarding and  rebound.  There is no palpable mass.  Bowel sounds are present.  RECTAL:  Not done today.  GENITOURINARY:  Unremarkable.  EXTREMITIES:  Without clubbing, cyanosis, or edema.   Laboratory studies on admission show a WBC of 14.7, hemoglobin 15.8,  hematocrit of 44.8.  Electrolytes within normal limits.  Glucose is 100, BUN  11, creatinine 1.0.  Liver function studies normal.  Albumin 3.5.  Amylase  and lipase both normal at 42 and 17.  UA negative.   IMPRESSION:  20. A 65 year old white male with acute abdominal pain x24 hours, primarily     left lower quadrant, with concern for peritoneal inflammation with     rebound.  Suspect acute diverticulitis, rule out perforation.  2. History of colon polyps.  3. Status post cervical disk decompression and fusion.  4. Status post left knee arthroscopy.   PLAN:  The patient is admitted to the service of Dr. Yancey [name] for IV fluid  hydration.  He will be kept NPO.  Will give him Demerol and Phenergan as  needed for control of pain, start him on IV Unasyn and IV fluids,  and obtain  a stat CT scan of the abdomen and pelvis.  For further details, please see  the orders.     Carl Esterwood, P.A.-C. [facility]                Carl [name], M.D. [facility]    AE/MEDQ  D:  08/14/2003  T:  08/14/2003  Job:  [id number]   cc:   Carl Anes. Idell Pickles, M.D.  [address]  [place]  [place] [address]  Fax: [phone]

## 2010-11-12 LAB — LIPID PANEL
Cholesterol: 193
LDL Cholesterol: 119 — ABNORMAL HIGH
Total CHOL/HDL Ratio: 3.4

## 2010-11-12 LAB — PROTIME-INR
INR: 0.9
Prothrombin Time: 12.4

## 2010-11-12 LAB — CBC
Hemoglobin: 15.6
MCV: 99.1
Platelets: 250
RDW: 13.7
WBC: 7.2

## 2010-11-12 LAB — COMPREHENSIVE METABOLIC PANEL
ALT: 25
Alkaline Phosphatase: 47
CO2: 27
Chloride: 104
GFR calc non Af Amer: >60
Glucose, Bld: 87
Potassium: 3.9
Sodium: 136

## 2010-11-12 LAB — I-STAT 8, (EC8 V) (CONVERTED LAB)
Acid-base deficit: 1
BUN: 11
Glucose, Bld: 94
HCT: 49
Hemoglobin: 16.7

## 2010-11-12 LAB — DIFFERENTIAL
Basophils Relative: 1
Lymphs Abs: 1.7
Monocytes Absolute: 0.8
Monocytes Relative: 11

## 2010-11-12 LAB — POCT I-STAT CREATININE: Creatinine, Ser: 1.1

## 2010-11-12 LAB — HEMOGLOBIN A1C: Hgb A1c MFr Bld: 5.4

## 2011-01-26 ENCOUNTER — Telehealth: Payer: Self-pay | Admitting: Internal Medicine

## 2011-01-26 ENCOUNTER — Other Ambulatory Visit: Payer: Self-pay | Admitting: Internal Medicine

## 2011-01-26 MED ORDER — METRONIDAZOLE 500 MG PO TABS: 500.0000 mg | ORAL_TABLET | Freq: Three times a day (TID) | ORAL | Status: AC

## 2011-01-26 MED ORDER — CIPROFLOXACIN HCL 500 MG PO TABS: 500.0000 mg | ORAL_TABLET | Freq: Two times a day (BID) | ORAL | Status: AC

## 2011-01-26 NOTE — Telephone Encounter (Signed)
Noted. Thanks Vonna Kotyk.   Bonita Quin, follow up with Mr. Nyce on Friday morning to see how he's doing. Thanks. JNP

## 2011-01-26 NOTE — Telephone Encounter (Signed)
Mr. Sommers called stating he feels he is developing early diverticulitis. He states this occurs approx once/yr, and these symptoms are very consistent with prior episodes. Left middle abd pain, not yet severe.  Able to eat/drink.  BMs normal to this point without bleeding/melena. No fevers, chills.  In the past he has taken cipro/metronidazole, but he is out of these at present. I have prescribed cipro/metronidazole 500 BID/500 TID respectively Liquid diet, advance as tolerated. Office to call and ensure he is improving.  I asked that he notify us if he worsens or symptoms change in anyway. Time provided for questions/answers.  He thanked me for the call.

## 2011-01-28 NOTE — Telephone Encounter (Signed)
Thanks Linda!

## 2011-01-28 NOTE — Telephone Encounter (Signed)
Spoke with pt and he states he is feeling great. Instructed pt to finish taking the antibiotics. Pt aware.

## 2011-02-09 DIAGNOSIS — J019 Acute sinusitis, unspecified: Secondary | ICD-10-CM | POA: Diagnosis not present

## 2011-02-09 DIAGNOSIS — H6120 Impacted cerumen, unspecified ear: Secondary | ICD-10-CM | POA: Diagnosis not present

## 2011-02-21 DIAGNOSIS — R413 Other amnesia: Secondary | ICD-10-CM | POA: Diagnosis not present

## 2011-03-09 DIAGNOSIS — M26609 Unspecified temporomandibular joint disorder, unspecified side: Secondary | ICD-10-CM | POA: Diagnosis not present

## 2011-04-22 DIAGNOSIS — H43819 Vitreous degeneration, unspecified eye: Secondary | ICD-10-CM | POA: Diagnosis not present

## 2011-04-22 DIAGNOSIS — H353190 Nonexudative age-related macular degeneration, unspecified eye, stage unspecified: Secondary | ICD-10-CM | POA: Diagnosis not present

## 2011-04-22 DIAGNOSIS — H35039 Hypertensive retinopathy, unspecified eye: Secondary | ICD-10-CM | POA: Diagnosis not present

## 2011-04-22 DIAGNOSIS — Z961 Presence of intraocular lens: Secondary | ICD-10-CM | POA: Diagnosis not present

## 2011-07-25 DIAGNOSIS — H6690 Otitis media, unspecified, unspecified ear: Secondary | ICD-10-CM | POA: Diagnosis not present

## 2011-08-09 ENCOUNTER — Telehealth: Payer: Self-pay | Admitting: Gastroenterology

## 2011-08-09 ENCOUNTER — Telehealth: Payer: Self-pay | Admitting: Internal Medicine

## 2011-08-09 NOTE — Telephone Encounter (Signed)
Wanted to talk to a nurse about his Diverticulitis

## 2011-08-09 NOTE — Telephone Encounter (Signed)
Left message for pt to call back.  See additional note.

## 2011-08-09 NOTE — Telephone Encounter (Signed)
If no allergies, call in cipro 500 mg bid and flagyl 500 mg bid each for 10 days. Get him to see Amy on Monday (I'm supervising)

## 2011-08-09 NOTE — Telephone Encounter (Signed)
Pt returning your call

## 2011-08-09 NOTE — Telephone Encounter (Signed)
Pt is on his way back to [place] from out of town, states he is having a diverticulitis flare. Last time he had a problem was in December. States he usually takes the antibiotics and that takes care of it. Requesting that Dr. Marina Goodell call in some antibiotics for him. Dr. Marina Goodell please advise.

## 2011-08-10 ENCOUNTER — Telehealth: Payer: Self-pay | Admitting: Internal Medicine

## 2011-08-10 ENCOUNTER — Telehealth: Payer: Self-pay | Admitting: Gastroenterology

## 2011-08-10 NOTE — Telephone Encounter (Signed)
Pt thought last time he had a flare he was told to do a special diet. Let pt know he was told to have clear liquids and advance as tolerated. Pt aware.

## 2011-08-10 NOTE — Telephone Encounter (Signed)
Carl [name], see on call phone note

## 2011-08-10 NOTE — Telephone Encounter (Signed)
See additional telephone note.   Spoke with pt and scheduled him to see Mike Gip PA 08/15/11@10am . Pt aware of appt date and time.

## 2011-08-10 NOTE — Telephone Encounter (Signed)
He called last night on call.  LLQ pains like his previous episodes of diverticulitis.  No fevers or chills.  No vomiting.  No diarrhea.  Has only one pill of cipro and one pill of flagyl left.  I called him in scripts for cipro500bid for 10 days, flagyl 500mg  tid for 10 days, no refills.  I see from other phone note he is to be seen in the office next week.

## 2011-08-15 ENCOUNTER — Ambulatory Visit (INDEPENDENT_AMBULATORY_CARE_PROVIDER_SITE_OTHER): Payer: Medicare Other | Admitting: Physician Assistant

## 2011-08-15 ENCOUNTER — Encounter: Payer: Self-pay | Admitting: Physician Assistant

## 2011-08-15 VITALS — BP 126/70 | HR 64 | Ht 67.5 in | Wt 181.2 lb

## 2011-08-15 DIAGNOSIS — K5732 Diverticulitis of large intestine without perforation or abscess without bleeding: Secondary | ICD-10-CM | POA: Diagnosis not present

## 2011-08-15 DIAGNOSIS — K5792 Diverticulitis of intestine, part unspecified, without perforation or abscess without bleeding: Secondary | ICD-10-CM

## 2011-08-15 MED ORDER — ALIGN PO CAPS: ORAL_CAPSULE | ORAL | Status: DC

## 2011-08-15 NOTE — Progress Notes (Signed)
Agree with assessment and plan as outlined.  

## 2011-08-15 NOTE — Patient Instructions (Addendum)
Continue to take the Cipro and Flagyl with food, this may help with the nausea. We have given you samples of Align probiotic, take the samples until finished. Call us back if symptoms are not completely resolved once completing the antibiotics. Make an appointment to see Dr. Marina Goodell as needed.

## 2011-08-15 NOTE — Progress Notes (Signed)
Subjective:    Patient ID: Carl [name], male    DOB: 1945/12/28, 66 y.o.   MRN: [id number]  HPI Maricela is a pleasant 66 year old male known to Dr. Marina Goodell, with history of diverticular disease and colon polyps. He had an episode of diverticulitis last fall and then called here on 08/10/2011 with another flare. He says he had been out of town after a death in the family and had been off of his usual schedule and had driven in the car for many hours when he had onset of left lower quadrant  Pain.Marland Kitchen He was called in a 10 day course of Cipro and Flagyl and says he started feeling better within 24 hours. He says he's been doing well since is feeling a little bit queasy this morning but says the may of taken in the next her dose of antibiotics on an empty stomach. He has had no fever chills or sweats his bowel movements have been normal to loose no melena or hematochezia and he has no current complaints of abdominal pain. Last colonoscopy was done in September of 2011 he had 2 diminutive polyps in the a sitting in descending colon which were hyperplastic and a pedunculated polyp in the rectum which was removed and was a tubular adenoma. Is also noted to have severe diverticulosis in the left colon and mild diverticulosis in the ascending colon.    Review of Systems  Constitutional: Negative.   HENT: Negative.   Eyes: Negative.   Respiratory: Negative.   Cardiovascular: Negative.   Gastrointestinal: Positive for abdominal pain.  Genitourinary: Negative.   Musculoskeletal: Negative.   Neurological: Negative.   Hematological: Negative.   Psychiatric/Behavioral: Negative.    Outpatient Encounter Prescriptions as of 08/15/2011  Medication Sig Dispense Refill  . ciprofloxacin (CIPRO) 500 MG tablet Take 500 mg by mouth 2 (two) times daily.      . citalopram (CELEXA) 10 MG tablet Take 10 mg by mouth daily.      . fluticasone (VERAMYST) 27.5 MCG/SPRAY nasal spray Place 1 spray into the nose daily.      .  metroNIDAZOLE (FLAGYL) 500 MG tablet Take 500 mg by mouth 3 (three) times daily.      . simvastatin (ZOCOR) 40 MG tablet Take 40 mg by mouth every evening.      . valACYclovir (VALTREX) 500 MG tablet Take 500 mg by mouth daily.      Marland Kitchen zolpidem (AMBIEN CR) 12.5 MG CR tablet Take 12.5 mg by mouth at bedtime as needed.      Marland Kitchen DISCONTD: zolpidem (AMBIEN) 10 MG tablet Take 10 mg by mouth at bedtime as needed.      . bifidobacterium infantis (ALIGN) capsule Take 1 cap daily for 21 days.  14 capsule  0       No Known Allergies Patient Active Problem List  Diagnosis  . GERD  . ULCER-GASTRIC  . GASTRITIS  . DIVERTICULITIS OF COLON  . BLOOD IN STOOL  . ABDOMINAL PAIN  . PERSONAL HISTORY OF COLONIC POLYPS   History   Social History  . Marital Status: Divorced    Spouse Name: N/A    Number of Children: N/A  . Years of Education: N/A   Occupational History  . Not on file.   Social History Main Topics  . Smoking status: Never Smoker   . Smokeless tobacco: Never Used  . Alcohol Use: Yes     2 drinks 5 nights per week  . Drug Use: No  .  Sexually Active: Not on file   Other Topics Concern  . Not on file   Social History Narrative  . No narrative on file   Objective:   Physical Exam well-developed white male in no acute distress, pleasant blood pressure 126/70 pulse 64. HEENT; nontraumatic normocephalic EOMI PERRLA sclera anicteric, Neck; Supple ,no JVD, Cardiovascular, regular rate and rhythm with S1-S2 no murmur or gallop, Pulmonary; clear bilaterally, Abdomen; soft minimally tender in the left mid quadrant, left lower quadrant is no guarding no rebound no palpable mass or hepatosplenomegaly bowel sounds are present. Rectal; exam not done Extremities; no clubbing cyanosis or edema skin warm and dry, Psych; mood and affect normal and appropriate        Assessment & Plan:  #59 66 year old male with recurrent diverticulitis improving and minimally symptomatic on current course of  antibiotics.   #2 history of colon polyps, adenomatous last colonoscopy September 2011  Plan; complete current 10 day course of Cipro and Flagyl. Add align one by mouth daily for 2-3 week Patient advised to call if he has any residual symptoms when he finishes the antibiotics and will give him a more extended course Follow up with Dr. Marina Goodell on an as-needed basis.

## 2011-09-03 DIAGNOSIS — R209 Unspecified disturbances of skin sensation: Secondary | ICD-10-CM | POA: Diagnosis not present

## 2011-10-10 ENCOUNTER — Emergency Department (HOSPITAL_COMMUNITY)
Admission: EM | Admit: 2011-10-10 | Discharge: 2011-10-10 | Discharge status: Absent without leave | Payer: Medicare Other | Source: Home / Self Care

## 2011-11-16 DIAGNOSIS — F411 Generalized anxiety disorder: Secondary | ICD-10-CM | POA: Diagnosis not present

## 2011-11-16 DIAGNOSIS — E785 Hyperlipidemia, unspecified: Secondary | ICD-10-CM | POA: Diagnosis not present

## 2011-11-16 DIAGNOSIS — Z23 Encounter for immunization: Secondary | ICD-10-CM | POA: Diagnosis not present

## 2011-11-16 DIAGNOSIS — Z79899 Other long term (current) drug therapy: Secondary | ICD-10-CM | POA: Diagnosis not present

## 2012-06-21 DIAGNOSIS — E7800 Pure hypercholesterolemia, unspecified: Secondary | ICD-10-CM | POA: Diagnosis not present

## 2012-06-21 DIAGNOSIS — Z79899 Other long term (current) drug therapy: Secondary | ICD-10-CM | POA: Diagnosis not present

## 2012-08-06 DIAGNOSIS — J019 Acute sinusitis, unspecified: Secondary | ICD-10-CM | POA: Diagnosis not present

## 2012-08-17 DIAGNOSIS — J329 Chronic sinusitis, unspecified: Secondary | ICD-10-CM | POA: Diagnosis not present

## 2012-09-27 DIAGNOSIS — M47817 Spondylosis without myelopathy or radiculopathy, lumbosacral region: Secondary | ICD-10-CM | POA: Diagnosis not present

## 2012-09-27 DIAGNOSIS — M545 Low back pain, unspecified: Secondary | ICD-10-CM | POA: Diagnosis not present

## 2012-10-03 DIAGNOSIS — H60399 Other infective otitis externa, unspecified ear: Secondary | ICD-10-CM | POA: Diagnosis not present

## 2012-12-16 DIAGNOSIS — Z23 Encounter for immunization: Secondary | ICD-10-CM | POA: Diagnosis not present

## 2012-12-25 ENCOUNTER — Encounter (HOSPITAL_COMMUNITY): Payer: Self-pay | Admitting: Emergency Medicine

## 2012-12-25 ENCOUNTER — Emergency Department (HOSPITAL_COMMUNITY)
Admission: EM | Admit: 2012-12-25 | Discharge: 2012-12-25 | Disposition: A | Discharge status: Home / Self Care | Payer: Medicare Other | Attending: Emergency Medicine | Admitting: Emergency Medicine

## 2012-12-25 DIAGNOSIS — Z8719 Personal history of other diseases of the digestive system: Secondary | ICD-10-CM | POA: Diagnosis not present

## 2012-12-25 DIAGNOSIS — R9431 Abnormal electrocardiogram [ECG] [EKG]: Secondary | ICD-10-CM | POA: Diagnosis not present

## 2012-12-25 DIAGNOSIS — Z79899 Other long term (current) drug therapy: Secondary | ICD-10-CM | POA: Insufficient documentation

## 2012-12-25 DIAGNOSIS — IMO0002 Reserved for concepts with insufficient information to code with codable children: Secondary | ICD-10-CM | POA: Diagnosis not present

## 2012-12-25 DIAGNOSIS — F3289 Other specified depressive episodes: Secondary | ICD-10-CM | POA: Insufficient documentation

## 2012-12-25 DIAGNOSIS — F329 Major depressive disorder, single episode, unspecified: Secondary | ICD-10-CM | POA: Diagnosis not present

## 2012-12-25 DIAGNOSIS — K219 Gastro-esophageal reflux disease without esophagitis: Secondary | ICD-10-CM

## 2012-12-25 DIAGNOSIS — K920 Hematemesis: Secondary | ICD-10-CM | POA: Insufficient documentation

## 2012-12-25 LAB — COMPREHENSIVE METABOLIC PANEL
ALT: 41 U/L (ref 0–53)
Calcium: 9.9 mg/dL (ref 8.4–10.5)
Creatinine, Ser: 0.95 mg/dL (ref 0.50–1.35)
GFR calc Af Amer: >90 mL/min (ref >90)
GFR calc non Af Amer: 84 mL/min — ABNORMAL LOW (ref >90)
Glucose, Bld: 102 mg/dL — ABNORMAL HIGH (ref 70–99)
Sodium: 136 mEq/L (ref 135–145)
Total Protein: 7 g/dL (ref 6.0–8.3)

## 2012-12-25 LAB — CBC WITH DIFFERENTIAL/PLATELET
Basophils Absolute: 0.1 10*3/uL (ref 0.0–0.1)
Eosinophils Absolute: 0.3 10*3/uL (ref 0.0–0.7)
Eosinophils Relative: 3 % (ref 0–5)
HCT: 44.3 % (ref 39.0–52.0)
Lymphs Abs: 2.4 10*3/uL (ref 0.7–4.0)
MCH: 33.1 pg (ref 26.0–34.0)
MCV: 94.1 fL (ref 78.0–100.0)
Monocytes Absolute: 0.8 10*3/uL (ref 0.1–1.0)
Platelets: 253 10*3/uL (ref 150–400)
RDW: 13 % (ref 11.5–15.5)

## 2012-12-25 LAB — POCT I-STAT TROPONIN I: Troponin i, poc: 0.01 ng/mL (ref 0.00–0.08)

## 2012-12-25 MED ORDER — PANTOPRAZOLE SODIUM 20 MG PO TBEC: 40.0000 mg | DELAYED_RELEASE_TABLET | Freq: Every day | ORAL | Status: DC

## 2012-12-25 MED ORDER — GI COCKTAIL ~~LOC~~
30.0000 mL | Freq: Once | ORAL | Status: AC
  Administered 2012-12-25: 30 mL via ORAL
  Filled 2012-12-25: qty 30

## 2012-12-25 MED ORDER — ONDANSETRON HCL 4 MG/2ML IJ SOLN
4.0000 mg | Freq: Once | INTRAMUSCULAR | Status: AC
  Administered 2012-12-25: 4 mg via INTRAVENOUS
  Filled 2012-12-25: qty 2

## 2012-12-25 MED ORDER — PANTOPRAZOLE SODIUM 40 MG IV SOLR
40.0000 mg | Freq: Once | INTRAVENOUS | Status: AC
  Administered 2012-12-25: 40 mg via INTRAVENOUS
  Filled 2012-12-25: qty 40

## 2012-12-25 NOTE — ED Notes (Signed)
Pt had a stingy feeling in his throat tonight and states that it burns from his throat to his chest, he coughed up a little blood

## 2012-12-25 NOTE — ED Provider Notes (Signed)
CSN: [id number]     Arrival date & time 12/25/12  0202 History   First MD Initiated Contact with Patient 12/25/12 0204     Chief Complaint  Patient presents with  . Hematemesis   (Consider location/radiation/quality/duration/timing/severity/associated sxs/prior Treatment) HPI Department from home with complaint of burning from the back of his throat to his umbilicus.  Patient reports symptoms started about an hour ago.  He denies any pain, shortness of breath.  He denies nausea and vomiting, but does report that he has been "spitting up" small amounts of phlegm and blood.  He is unsure if it is blood or red wine.  Patient has history of GERD, but denies having anything like this in the past.  Patient reports splitting 2 bottles of wine with his girlfriend tonight.  Past medical history shows Esophagitis, gastric ulcer, depression, diverticulitis.  No fevers no chills.  He denies any diaphoresis, shortness of breath, chest pain, or bowel pain.  Patient was concern, and wanted to get checked out. Past Medical History  Diagnosis Date  . Diverticulitis   . Diverticulosis   . Esophagitis   . Gastric ulcer   . Hx of adenomatous colonic polyps   . Depression    Past Surgical History  Procedure Laterality Date  . Nasal sinus surgery    . Knee surgery      left  . Cervical spine surgery    . Tonsillectomy    . Foreign body removal esophageal     Family History  Problem Relation Age of Onset  . Colon cancer Paternal Grandfather     questionable   History  Substance Use Topics  . Smoking status: Never Smoker   . Smokeless tobacco: Never Used  . Alcohol Use: Yes     Comment: 2 drinks 5 nights per week    Review of Systems  See History of Present Illness; otherwise all other systems are reviewed and negative  Allergies  Review of patient's allergies indicates no known allergies.  Home Medications   Current Outpatient Rx  Name  Route  Sig  Dispense  Refill  . bifidobacterium  infantis (ALIGN) capsule      Take 1 cap daily for 21 days.   14 capsule   0     Lot # [id number] Exp date: 11/2011   . ciprofloxacin (CIPRO) 500 MG tablet   Oral   Take 500 mg by mouth 2 (two) times daily.         . citalopram (CELEXA) 10 MG tablet   Oral   Take 10 mg by mouth daily.         . fluticasone (VERAMYST) 27.5 MCG/SPRAY nasal spray   Nasal   Place 1 spray into the nose daily.         . metroNIDAZOLE (FLAGYL) 500 MG tablet   Oral   Take 500 mg by mouth 3 (three) times daily.         . simvastatin (ZOCOR) 40 MG tablet   Oral   Take 40 mg by mouth every evening.         . valACYclovir (VALTREX) 500 MG tablet   Oral   Take 500 mg by mouth daily.         Marland Kitchen zolpidem (AMBIEN CR) 12.5 MG CR tablet   Oral   Take 12.5 mg by mouth at bedtime as needed.          BP 130/68  Temp(Src) 98.6 F (37 C) (Oral)  Resp  19  SpO2 93% Physical Exam  Nursing note and vitals reviewed. Constitutional: He is oriented to person, place, and time. He appears well-developed and well-nourished.  HENT:  Head: Normocephalic and atraumatic.  Nose: Nose normal.  Mouth/Throat: Oropharynx is clear and moist.  Eyes: Conjunctivae and EOM are normal. Pupils are equal, round, and reactive to light.  Neck: Normal range of motion. Neck supple. No JVD present. No tracheal deviation present. No thyromegaly present.  Cardiovascular: Normal rate, regular rhythm, normal heart sounds and intact distal pulses.  Exam reveals no gallop and no friction rub.   No murmur heard. Pulmonary/Chest: Effort normal and breath sounds normal. No stridor. No respiratory distress. He has no wheezes. He has no rales. He exhibits no tenderness.  Abdominal: Soft. Bowel sounds are normal. He exhibits no distension and no mass. There is no tenderness. There is no rebound and no guarding.  Musculoskeletal: Normal range of motion. He exhibits no edema and no tenderness.  Lymphadenopathy:    He has no  cervical adenopathy.  Neurological: He is alert and oriented to person, place, and time. He has normal reflexes. No cranial nerve deficit. He exhibits normal muscle tone. Coordination normal.  Skin: Skin is warm and dry. No rash noted. No erythema. No pallor.  Psychiatric: He has a normal mood and affect. His behavior is normal. Judgment and thought content normal.    ED Course  Procedures (including critical care time) Labs Review Labs Reviewed  CBC WITH DIFFERENTIAL  COMPREHENSIVE METABOLIC PANEL  LIPASE, BLOOD  POCT I-STAT TROPONIN I   Imaging Review No results found.  EKG Interpretation    Date/Time:  Tuesday December 25 2012 02:17:40 EST Ventricular Rate:  71 PR Interval:  155 QRS Duration: 89 QT Interval:  398 QTC Calculation: 432 R Axis:   61 Text Interpretation:  Sinus rhythm Consider left atrial enlargement No significant change since last tracing            MDM  No diagnosis found. 67 year old male with burning sensation from throat to umbilicus.  Suspect gastritis/GERD.  Given age and location of pain.  Will check EKG, baseline labs.  Will give Zofran, Protonix, and GI cocktail and reassess.  3:33 AM Workup unremarkable.  Patient reports all symptoms have resolved after GI cocktail Will d/c home to f/u with pcm, will start on prilosec.  Olivia [name], MD 12/25/12 [phone]

## 2013-02-13 DIAGNOSIS — K120 Recurrent oral aphthae: Secondary | ICD-10-CM | POA: Diagnosis not present

## 2013-03-14 DIAGNOSIS — G479 Sleep disorder, unspecified: Secondary | ICD-10-CM | POA: Diagnosis not present

## 2013-03-14 DIAGNOSIS — Z23 Encounter for immunization: Secondary | ICD-10-CM | POA: Diagnosis not present

## 2013-03-14 DIAGNOSIS — Z1331 Encounter for screening for depression: Secondary | ICD-10-CM | POA: Diagnosis not present

## 2013-03-14 DIAGNOSIS — F411 Generalized anxiety disorder: Secondary | ICD-10-CM | POA: Diagnosis not present

## 2013-09-14 DIAGNOSIS — H6120 Impacted cerumen, unspecified ear: Secondary | ICD-10-CM | POA: Diagnosis not present

## 2013-11-24 ENCOUNTER — Telehealth: Payer: Self-pay | Admitting: Gastroenterology

## 2013-11-24 MED ORDER — CIPROFLOXACIN HCL 500 MG PO TABS: 500.0000 mg | ORAL_TABLET | Freq: Two times a day (BID) | ORAL | Status: DC

## 2013-11-24 MED ORDER — METRONIDAZOLE 500 MG PO TABS: 500.0000 mg | ORAL_TABLET | Freq: Three times a day (TID) | ORAL | Status: DC

## 2013-11-24 NOTE — Telephone Encounter (Signed)
He called with 1 day of mid, lower abd pain, no fevers, no nausea, vomiting or change in his bowels.  He says this is the way his diveritulitis flares always start and he is pretty certain he is having a flare again.  I will call in cipro, flagyl.  He knows to call in 1-2 days if he is not feeling better of is he starts to feel worse.  Denice Bors

## 2013-11-25 ENCOUNTER — Telehealth: Payer: Self-pay | Admitting: Internal Medicine

## 2013-11-25 NOTE — Telephone Encounter (Signed)
Thanks

## 2013-11-25 NOTE — Telephone Encounter (Signed)
Pt states he spoke with Dr. Ardis Hughs yesterday and was started on some antibiotics for diverticulitis. Pt wanted to know what he should and should not eat. Diet discussed with pt and he verbalized understanding.

## 2014-01-14 ENCOUNTER — Telehealth: Payer: Self-pay | Admitting: Physician Assistant

## 2014-01-14 NOTE — Telephone Encounter (Signed)
Abdominal pain since Sunday off and on. No vomiting-hx of diverticulitis but this is different-appointment made

## 2014-01-15 ENCOUNTER — Encounter: Payer: Self-pay | Admitting: Gastroenterology

## 2014-01-15 ENCOUNTER — Ambulatory Visit (INDEPENDENT_AMBULATORY_CARE_PROVIDER_SITE_OTHER): Payer: Medicare Other | Admitting: Gastroenterology

## 2014-01-15 ENCOUNTER — Other Ambulatory Visit (INDEPENDENT_AMBULATORY_CARE_PROVIDER_SITE_OTHER): Payer: Medicare Other

## 2014-01-15 VITALS — BP 114/72 | HR 76 | Ht 67.5 in | Wt 184.0 lb

## 2014-01-15 DIAGNOSIS — Z8719 Personal history of other diseases of the digestive system: Secondary | ICD-10-CM | POA: Diagnosis not present

## 2014-01-15 DIAGNOSIS — R109 Unspecified abdominal pain: Secondary | ICD-10-CM

## 2014-01-15 LAB — CBC WITH DIFFERENTIAL/PLATELET
BASOS PCT: 0.4 % (ref 0.0–3.0)
Basophils Absolute: 0 10*3/uL (ref 0.0–0.1)
EOS PCT: 0.9 % (ref 0.0–5.0)
Eosinophils Absolute: 0.1 10*3/uL (ref 0.0–0.7)
HCT: 42.7 % (ref 39.0–52.0)
Hemoglobin: 14.6 g/dL (ref 13.0–17.0)
LYMPHS PCT: 12.6 % (ref 12.0–46.0)
Lymphs Abs: 1.6 10*3/uL (ref 0.7–4.0)
MCHC: 34.2 g/dL (ref 30.0–36.0)
MCV: 94.4 fl (ref 78.0–100.0)
Monocytes Absolute: 1.4 10*3/uL — ABNORMAL HIGH (ref 0.1–1.0)
Monocytes Relative: 11.3 % (ref 3.0–12.0)
NEUTROS PCT: 74.8 % (ref 43.0–77.0)
Neutro Abs: 9.3 10*3/uL — ABNORMAL HIGH (ref 1.4–7.7)
Platelets: 262 10*3/uL (ref 150.0–400.0)
RBC: 4.52 Mil/uL (ref 4.22–5.81)
RDW: 13.8 % (ref 11.5–15.5)
WBC: 12.5 10*3/uL — AB (ref 4.0–10.5)

## 2014-01-15 LAB — COMPREHENSIVE METABOLIC PANEL
ALBUMIN: 4 g/dL (ref 3.5–5.2)
ALT: 25 U/L (ref 0–53)
AST: 27 U/L (ref 0–37)
Alkaline Phosphatase: 69 U/L (ref 39–117)
BUN: 16 mg/dL (ref 6–23)
CALCIUM: 9.2 mg/dL (ref 8.4–10.5)
CHLORIDE: 100 meq/L (ref 96–112)
CO2: 29 mEq/L (ref 19–32)
Creatinine, Ser: 1 mg/dL (ref 0.4–1.5)
GFR: 82.4 mL/min (ref >60.00)
GLUCOSE: 105 mg/dL — AB (ref 70–99)
POTASSIUM: 4.6 meq/L (ref 3.5–5.1)
Sodium: 135 mEq/L (ref 135–145)
Total Bilirubin: 0.6 mg/dL (ref 0.2–1.2)
Total Protein: 6.9 g/dL (ref 6.0–8.3)

## 2014-01-15 MED ORDER — CIPROFLOXACIN HCL 500 MG PO TABS: 500.0000 mg | ORAL_TABLET | Freq: Two times a day (BID) | ORAL | Status: DC

## 2014-01-15 MED ORDER — METRONIDAZOLE 500 MG PO TABS: 500.0000 mg | ORAL_TABLET | Freq: Three times a day (TID) | ORAL | Status: DC

## 2014-01-15 NOTE — Patient Instructions (Addendum)
We have sent the following medications to your pharmacy for you to pick up at your convenience: Cipro 500 mg, one tablet by mouth twice daily for ten days Flagyl 500 mg, one tablet by mouth three times daily for ten days   Your physician has requested that you go to the basement for the following lab work before leaving today: CMET CBC-DIFF _______________________________________________________________________________________________________________________________________________________________________________________ Dennis Bast have been scheduled for a CT scan of the abdomen and pelvis at [facility] ([address]---this is in the same building as [facility]).  You are scheduled on 01-17-2014 at 3 PM. You should arrive 15 minutes prior to your appointment time for registration. Please follow the written instructions below on the day of your exam:  WARNING: IF YOU ARE ALLERGIC TO IODINE/X-RAY DYE, PLEASE NOTIFY RADIOLOGY IMMEDIATELY AT [phone]! YOU WILL BE GIVEN A 13 HOUR PREMEDICATION PREP.  1) Do not eat or drink anything after 11 am (4 hours prior to your test) 2) You have been given 2 bottles of oral contrast to drink. The solution may taste better if refrigerated, but do NOT add ice or any other liquid to this solution. Shake well before drinking.    Drink 1 bottle of contrast @ 1 pm (2 hours prior to your exam)  Drink 1 bottle of contrast @ 2 pm (1 hour prior to your exam)  You may take any medications as prescribed with a small amount of water except for the following: Metformin, Glucophage, Glucovance, Avandamet, Riomet, Fortamet, Actoplus Met, Janumet, Glumetza or Metaglip. The above medications must be held the day of the exam AND 48 hours after the exam.  The purpose of you drinking the oral contrast is to aid in the visualization of your intestinal tract. The contrast solution may cause some diarrhea. Before your exam is started, you will be given a small  amount of fluid to drink. Depending on your individual set of symptoms, you may also receive an intravenous injection of x-ray contrast/dye. Plan on being at [facility] for 30 minutes or long, depending on the type of exam you are having performed.  This test typically takes 30-45 minutes to complete.  If you have any questions regarding your exam or if you need to reschedule, you may call the CT department at [phone] between the hours of 8:00 am and 5:00 pm, Monday-Friday.  ________________________________________________________________________

## 2014-01-16 DIAGNOSIS — R109 Unspecified abdominal pain: Secondary | ICD-10-CM | POA: Insufficient documentation

## 2014-01-16 DIAGNOSIS — Z8719 Personal history of other diseases of the digestive system: Secondary | ICD-10-CM | POA: Insufficient documentation

## 2014-01-16 NOTE — Progress Notes (Signed)
Agree with assessment and plans. Thank you for seeing Aviv

## 2014-01-16 NOTE — Progress Notes (Signed)
     01/16/2014 Carl [name] [id number] 09-01-1945   History of Present Illness:  This is a pleasant 68 year old male who is known to Dr. Henrene Pastor.  He had a colonoscopy in 10/2009 at which time he was found to have severe diverticulosis in the left colon.  He also has some polyps removed; one hyperplastic, one tubular adenoma, and one tubulovillous adenoma.  Repeat colonoscopy is recommended in 5 years from that time.  He has history of diverticulitis and was last seen here for that issue in 08/2011.  Has CT scan documenting such in 2005.  He presents to our office today with complaints of lower abdominal pain that began on Sunday.  He says that it felt a little different than usual diverticulitis so he was not sure about it at first.  He tends to be constipated so uses laxatives prn.  He used laxatives and magnesium citrate, but cleaning out his bowels did not help the pain.  Starting to feel more like typical diverticulitis now.  No fevers, chills, nausea, and vomiting.    Current Medications, Allergies, Past Medical History, Past Surgical History, Family History and Social History were reviewed in [employer] record.   Physical Exam: BP 114/72 mmHg  Pulse 76  Ht 5' 7.5" (1.715 m)  Wt 184 lb (83.462 kg)  BMI 28.38 kg/m2 General: Well developed white male in no acute distress Head: Normocephalic and atraumatic Eyes:  Sclerae anicteric, conjunctiva pink  Ears: Normal auditory acuity Lungs: Clear throughout to auscultation Heart: Regular rate and rhythm Abdomen: Soft, non-distended.  Normal bowel sounds.  Moderate LLQ and suprapubic TTP without R/R/G. Musculoskeletal: Symmetrical with no gross deformities  Extremities: No edema  Neurological: Alert oriented x 4, grossly non-focal Psychological:  Alert and cooperative. Normal mood and affect  Assessment and Recommendations: -LLQ abdominal pain:  Has history of diverticulitis and has been treated in the past, although we  do not have any radiographic evidence of such since 2005.  Is suspect that is what he has going on now.  We will empirically start cipro 500 mg BID and flagyl 500 mg TID for 10 days, but I would also like to check CBC, BMP, and CT scan of the abdomen and pelvis to confirm and rule out other issues.

## 2014-01-17 ENCOUNTER — Ambulatory Visit (INDEPENDENT_AMBULATORY_CARE_PROVIDER_SITE_OTHER)
Admission: RE | Admit: 2014-01-17 | Discharge: 2014-01-17 | Disposition: A | Discharge status: Home / Self Care | Payer: Medicare Other | Source: Ambulatory Visit | Attending: Gastroenterology | Admitting: Gastroenterology

## 2014-01-17 DIAGNOSIS — Z8719 Personal history of other diseases of the digestive system: Secondary | ICD-10-CM | POA: Diagnosis not present

## 2014-01-17 DIAGNOSIS — K5732 Diverticulitis of large intestine without perforation or abscess without bleeding: Secondary | ICD-10-CM | POA: Diagnosis not present

## 2014-01-17 DIAGNOSIS — R109 Unspecified abdominal pain: Secondary | ICD-10-CM | POA: Diagnosis not present

## 2014-01-17 MED ORDER — IOHEXOL 300 MG/ML  SOLN
100.0000 mL | Freq: Once | INTRAMUSCULAR | Status: AC | PRN
  Administered 2014-01-17: 100 mL via INTRAVENOUS

## 2014-01-30 ENCOUNTER — Telehealth: Payer: Self-pay | Admitting: Gastroenterology

## 2014-01-30 MED ORDER — CIPROFLOXACIN HCL 500 MG PO TABS: 500.0000 mg | ORAL_TABLET | Freq: Two times a day (BID) | ORAL | Status: DC

## 2014-01-30 MED ORDER — METRONIDAZOLE 500 MG PO TABS: 500.0000 mg | ORAL_TABLET | Freq: Three times a day (TID) | ORAL | Status: DC

## 2014-01-30 NOTE — Telephone Encounter (Signed)
Got a call from patient wanting to speak with on-call about diverticulitis.  He was treated by myself for an episode of diverticulitis when seen in the office on 01/16/14.  Was given Flagyl 500 mg TID for ten days and cipro 500 mg BID for 10 days.  CT scan demonstrated mild diverticulitis.  He calls today saying that he completed his medication and has been doing very well.  Says that he had a big Christmas party on 12/22 and ate some cashews.  Had some very mild discomfort on left side that just began, but resembled his diverticulitis.  He just wanted to call and see if we could send over another prescription for the antibiotics just in case he needs them over the holiday and the weekend.  He says that he will not take them unless he absolutely thinks that he needs to.  Prescriptions for cipro and flagyl have been sent to his pharmacy at the same doses stated above.  I asked him to call the office next week with an update if he starts taking them.

## 2014-02-26 DIAGNOSIS — M79652 Pain in left thigh: Secondary | ICD-10-CM | POA: Diagnosis not present

## 2014-03-06 ENCOUNTER — Encounter (HOSPITAL_BASED_OUTPATIENT_CLINIC_OR_DEPARTMENT_OTHER): Payer: Self-pay | Admitting: *Deleted

## 2014-03-06 ENCOUNTER — Emergency Department (HOSPITAL_BASED_OUTPATIENT_CLINIC_OR_DEPARTMENT_OTHER)
Admission: EM | Admit: 2014-03-06 | Discharge: 2014-03-06 | Disposition: A | Discharge status: Home / Self Care | Payer: Medicare Other | Attending: Emergency Medicine | Admitting: Emergency Medicine

## 2014-03-06 ENCOUNTER — Emergency Department (HOSPITAL_BASED_OUTPATIENT_CLINIC_OR_DEPARTMENT_OTHER): Payer: Medicare Other

## 2014-03-06 DIAGNOSIS — F329 Major depressive disorder, single episode, unspecified: Secondary | ICD-10-CM | POA: Insufficient documentation

## 2014-03-06 DIAGNOSIS — R600 Localized edema: Secondary | ICD-10-CM

## 2014-03-06 DIAGNOSIS — M7989 Other specified soft tissue disorders: Secondary | ICD-10-CM | POA: Diagnosis not present

## 2014-03-06 DIAGNOSIS — M79605 Pain in left leg: Secondary | ICD-10-CM | POA: Diagnosis present

## 2014-03-06 DIAGNOSIS — Z792 Long term (current) use of antibiotics: Secondary | ICD-10-CM | POA: Diagnosis not present

## 2014-03-06 DIAGNOSIS — Z86010 Personal history of colonic polyps: Secondary | ICD-10-CM | POA: Diagnosis not present

## 2014-03-06 DIAGNOSIS — S7012XA Contusion of left thigh, initial encounter: Secondary | ICD-10-CM | POA: Diagnosis not present

## 2014-03-06 DIAGNOSIS — R52 Pain, unspecified: Secondary | ICD-10-CM

## 2014-03-06 DIAGNOSIS — R2242 Localized swelling, mass and lump, left lower limb: Secondary | ICD-10-CM | POA: Diagnosis not present

## 2014-03-06 DIAGNOSIS — Z79899 Other long term (current) drug therapy: Secondary | ICD-10-CM | POA: Insufficient documentation

## 2014-03-06 DIAGNOSIS — Z8719 Personal history of other diseases of the digestive system: Secondary | ICD-10-CM | POA: Diagnosis not present

## 2014-03-06 NOTE — ED Notes (Signed)
He slipped a week ago. Had pain, swelling and bruising to his left upper leg initially. Since that time he has noticed swelling in his entire leg. This am his foot was so swollen he was unable to get his shoe on.

## 2014-03-06 NOTE — ED Notes (Signed)
MD at bedside. 

## 2014-03-06 NOTE — ED Provider Notes (Signed)
CSN: [id number]     Arrival date & time 03/06/14  1715 History   First MD Initiated Contact with Patient 03/06/14 1753     Chief Complaint  Patient presents with  . Leg Pain     Patient is a 69 y.o. male presenting with leg pain. The history is provided by the patient.  Leg Pain Location:  Leg Time since incident:  1 week Leg location:  L leg Pain details:    Quality:  Aching   Onset quality:  Gradual   Duration:  1 week   Timing:  Constant   Progression:  Unchanged Chronicity:  New Relieved by:  Rest Worsened by:  Activity Associated symptoms: swelling   Associated symptoms: no fever   Patient reports he slipped last week and "pulled a muscle" in his left thigh.  He reports he had immediate pain/swelling/bruising to left thigh.  He reports over past week since then he has had progressive swelling to left LE.  He reports chronic left knee swelling  He saw PCP today who ordered testing and found to have elevated d-dimer  No cp/sob No h/o DVT/PE  Past Medical History  Diagnosis Date  . Diverticulitis   . Diverticulosis   . Esophagitis   . Gastric ulcer   . Hx of adenomatous colonic polyps   . Depression    Past Surgical History  Procedure Laterality Date  . Nasal sinus surgery    . Knee surgery      left  . Cervical spine surgery    . Tonsillectomy    . Foreign body removal esophageal     Family History  Problem Relation Age of Onset  . Colon cancer Paternal Grandfather     questionable   History  Substance Use Topics  . Smoking status: Never Smoker   . Smokeless tobacco: Never Used  . Alcohol Use: 0.0 oz/week    0 Not specified per week     Comment: 2 drinks 5 nights per week    Review of Systems  Constitutional: Negative for fever.  Respiratory: Negative for shortness of breath.   Cardiovascular: Positive for leg swelling. Negative for chest pain.  Gastrointestinal: Negative for vomiting.  All other systems reviewed and are  negative.     Allergies  Review of patient's allergies indicates no known allergies.  Home Medications   Prior to Admission medications   Medication Sig Start Date End Date Taking? Authorizing Provider  ALPRAZolam Duanne Moron) 0.25 MG tablet  11/05/13   Historical Provider, MD  bifidobacterium infantis (ALIGN) capsule Take 1 cap daily for 21 days. 08/15/11   Amy S Esterwood, PA-C  ciprofloxacin (CIPRO) 500 MG tablet Take 1 tablet (500 mg total) by mouth 2 (two) times daily. 01/30/14   Laban Emperor. Zehr, PA-C  citalopram (CELEXA) 10 MG tablet Take 10 mg by mouth daily.    Historical Provider, MD  metroNIDAZOLE (FLAGYL) 500 MG tablet Take 1 tablet (500 mg total) by mouth 3 (three) times daily. 01/30/14   Laban Emperor. Zehr, PA-C  pantoprazole (PROTONIX) 20 MG tablet Take 2 tablets (40 mg total) by mouth daily. 12/25/12   Kalman Drape, MD  simvastatin (ZOCOR) 40 MG tablet Take 40 mg by mouth every evening.    Historical Provider, MD  valACYclovir (VALTREX) 500 MG tablet Take 500 mg by mouth daily.    Historical Provider, MD  zolpidem (AMBIEN CR) 12.5 MG CR tablet Take 12.5 mg by mouth at bedtime as needed for sleep.  Historical Provider, MD   BP 136/75 mmHg  Pulse 74  Temp(Src) 98.3 F (36.8 C) (Oral)  Resp 16  Ht 5\' 9"  (1.753 m)  Wt 185 lb (83.915 kg)  BMI 27.31 kg/m2  SpO2 100% Physical Exam CONSTITUTIONAL: Well developed/well nourished HEAD: Normocephalic/atraumatic EYES: EOMI/PERRL ENMT: Mucous membranes moist NECK: supple no meningeal signs CV: S1/S2 noted, no murmurs/rubs/gallops noted LUNGS: Lungs are clear to auscultation bilaterally, no apparent distress ABDOMEN: soft, nontender, no rebound or guarding, bowel sounds noted throughout abdomen NEURO: Pt is awake/alert/appropriate, moves all extremitiesx4.  No facial droop.   EXTREMITIES: pulses normal/equal, full ROM.  Tenderness/bruising to left inner thigh.  He has mild pitting edema to left LE.  Distal pulses equal/intact.  He  can range both left knee/ankle without difficulty SKIN: warm, color normal PSYCH: no abnormalities of mood noted, alert and oriented to situation  ED Course  Procedures  7:04 PM Pt declines pain meds Will order DVT study 8:48 PM No DVT by ultrasound Pt feels well for d/c home   Imaging Review US Venous Img Lower Unilateral Left  03/06/2014   CLINICAL DATA:  Slipped at home, stretching leg out. Bruising on back of thigh.  EXAM: Left LOWER EXTREMITY VENOUS DOPPLER ULTRASOUND  TECHNIQUE: Gray-scale sonography with graded compression, as well as color Doppler and duplex ultrasound were performed to evaluate the lower extremity deep venous systems from the level of the common femoral vein and including the common femoral, femoral, profunda femoral, popliteal and calf veins including the posterior tibial, peroneal and gastrocnemius veins when visible. The superficial great saphenous vein was also interrogated. Spectral Doppler was utilized to evaluate flow at rest and with distal augmentation maneuvers in the common femoral, femoral and popliteal veins.  COMPARISON:  None.  FINDINGS: Contralateral Common Femoral Vein: Respiratory phasicity is normal and symmetric with the symptomatic side. No evidence of thrombus. Normal compressibility.  Common Femoral Vein: No evidence of thrombus. Normal compressibility, respiratory phasicity and response to augmentation.  Saphenofemoral Junction: No evidence of thrombus. Normal compressibility and flow on color Doppler imaging.  Profunda Femoral Vein: No evidence of thrombus. Normal compressibility and flow on color Doppler imaging.  Femoral Vein: No evidence of thrombus. Normal compressibility, respiratory phasicity and response to augmentation.  Popliteal Vein: No evidence of thrombus. Normal compressibility, respiratory phasicity and response to augmentation.  Calf Veins: No evidence of thrombus. Normal compressibility and flow on color Doppler imaging.  Superficial  Great Saphenous Vein: No evidence of thrombus. Normal compressibility and flow on color Doppler imaging.  Venous Reflux:  None.  Other Findings:  None.  IMPRESSION: No evidence of deep venous thrombosis.   Electronically Signed   By: Andreas Newport M.D.   On: 03/06/2014 20:16      MDM   Final diagnoses:  Pain  Edema of left lower extremity    Nursing notes including past medical history and social history reviewed and considered in documentation     Sharyon Cable, MD 03/06/14 2049

## 2014-03-06 NOTE — Discharge Instructions (Signed)
RETURN FOR WORSENED PAIN, WEAKNESS, DISCOLORATION OF YOUR LEG OR IF YOU ARE UNABLE TO WALK OR BEAR WEIGHT ON THAT LEG.

## 2014-03-13 DIAGNOSIS — M1712 Unilateral primary osteoarthritis, left knee: Secondary | ICD-10-CM | POA: Diagnosis not present

## 2014-03-14 ENCOUNTER — Telehealth: Payer: Self-pay | Admitting: Internal Medicine

## 2014-03-14 MED ORDER — METRONIDAZOLE 500 MG PO TABS: 500.0000 mg | ORAL_TABLET | Freq: Three times a day (TID) | ORAL | Status: DC

## 2014-03-14 MED ORDER — CIPROFLOXACIN HCL 500 MG PO TABS: 500.0000 mg | ORAL_TABLET | Freq: Two times a day (BID) | ORAL | Status: DC

## 2014-03-14 NOTE — Telephone Encounter (Signed)
Patient reports pain "under his bellybutton" .  Patient with 2 to 3 day history of pain and constipation.  He denies nausea and vomiting or other complaints.  Discussed with Alonza Bogus, PA.  Patient needs follow up with Dr. Henrene Pastor on 04/10/14 8:30 and cipro and flagyl refills.  I called the patient back and got his voicemail.  Due to the lateness of the day I left a detailed message and appt date and time.

## 2014-03-20 DIAGNOSIS — I498 Other specified cardiac arrhythmias: Secondary | ICD-10-CM | POA: Diagnosis not present

## 2014-03-20 DIAGNOSIS — G459 Transient cerebral ischemic attack, unspecified: Secondary | ICD-10-CM | POA: Diagnosis not present

## 2014-03-20 DIAGNOSIS — M25569 Pain in unspecified knee: Secondary | ICD-10-CM | POA: Diagnosis not present

## 2014-04-04 ENCOUNTER — Telehealth: Payer: Self-pay | Admitting: Cardiovascular Disease

## 2014-04-04 NOTE — Telephone Encounter (Signed)
Received records from [facility] for appointment with Dr Claiborne Billings on 05/20/14.  Records given to [facility] (medical records) for Dr Evette Georges schedule on 05/20/14.  lp

## 2014-04-10 ENCOUNTER — Ambulatory Visit (INDEPENDENT_AMBULATORY_CARE_PROVIDER_SITE_OTHER): Payer: Medicare Other | Admitting: Internal Medicine

## 2014-04-10 ENCOUNTER — Encounter: Payer: Self-pay | Admitting: Internal Medicine

## 2014-04-10 VITALS — BP 128/62 | HR 56 | Ht 68.5 in | Wt 189.1 lb

## 2014-04-10 DIAGNOSIS — K5732 Diverticulitis of large intestine without perforation or abscess without bleeding: Secondary | ICD-10-CM | POA: Diagnosis not present

## 2014-04-10 DIAGNOSIS — K2100 Gastro-esophageal reflux disease with esophagitis, without bleeding: Secondary | ICD-10-CM

## 2014-04-10 DIAGNOSIS — Z86010 Personal history of colonic polyps: Secondary | ICD-10-CM | POA: Diagnosis not present

## 2014-04-10 DIAGNOSIS — K5901 Slow transit constipation: Secondary | ICD-10-CM

## 2014-04-10 MED ORDER — CIPROFLOXACIN HCL 500 MG PO TABS: 500.0000 mg | ORAL_TABLET | Freq: Two times a day (BID) | ORAL | Status: DC

## 2014-04-10 MED ORDER — METRONIDAZOLE 500 MG PO TABS: 500.0000 mg | ORAL_TABLET | Freq: Two times a day (BID) | ORAL | Status: DC

## 2014-04-10 NOTE — Patient Instructions (Signed)
We have sent the following medications to your pharmacy for you to pick up at your convenience:  Flagyl, Cipro

## 2014-04-10 NOTE — Progress Notes (Signed)
HISTORY OF PRESENT ILLNESS:  Carl [name] is a 69 y.o. male with a history of recurrent diverticulitis, adenomatous colon polyps, GERD with esophagitis, gastric ulcers, and general medical problems as listed below. I have not seen the patient since 2011 when he underwent surveillance colonoscopy. However, he was seen in this office by Janett Billow 01/16/2014 regarding lower abdominal pain. He was felt possibly to have diverticulitis and was started on ciprofloxacin and metronidazole. CBC revealed mild leukocytosis. CT scan of the abdomen and pelvis revealed mild acute diverticulitis in the region of the sigmoid colon without complication. As well marked diverticular disease of the left: Gen. Incidental small fat filled left inguinal hernia. He completed antibiotic therapy and was feeling better but contacted the office about a month ago with recurrent discomfort for which she was prescribed an additional course of antibiotics. He presents today for follow-up. Currently feeling well, though he did have some discomfort yesterday after eating roughage. Bowel habits have within see toward constipation. He continues on PPI for his history of GERD and gastric ulcers. He is anticipating knee replacement surgery. He will be due for surveillance colonoscopy later this year. Abimelec has probably required antibiotics 3 times over the past year for diverticulitis. His initial bout in 2005 was associated with microperforation.  REVIEW OF SYSTEMS:  All non-GI ROS negative except for left knee pain was swelling, ankle edema  Past Medical History  Diagnosis Date  . Diverticulitis   . Diverticulosis   . Esophagitis   . Gastric ulcer   . Hx of adenomatous colonic polyps   . Depression   . Hyperlipidemia     Past Surgical History  Procedure Laterality Date  . Nasal sinus surgery    . Knee surgery      left  . Cervical spine surgery    . Tonsillectomy    . Foreign body removal esophageal      Social History Carl [name]  reports that he has never smoked. He has never used smokeless tobacco. He reports that he drinks alcohol. He reports that he does not use illicit drugs.  family history includes Colon cancer in his paternal grandfather.  No Known Allergies     PHYSICAL EXAMINATION: Vital signs: BP 128/62 mmHg  Pulse 56  Ht 5' 8.5" (1.74 m)  Wt 189 lb 2 oz (85.787 kg)  BMI 28.33 kg/m2 General: Well-developed, well-nourished, no acute distress HEENT: Sclerae are anicteric, conjunctiva pink. Oral mucosa intact Lungs: Clear Heart: Regular with occasional irregular beat Abdomen: soft, mild tenderness with deep palpation in the left lower quadrant without rebound or guarding, nondistended, no obvious ascites, no peritoneal signs, normal bowel sounds. No organomegaly. Extremities: No edema bilaterally with mildly swollen left knee Psychiatric: alert and oriented x3. Cooperative    ASSESSMENT:  #1. Recurrent diverticulitis. Resolved #2. Functional constipation ongoing #3. History of GERD with esophagitis on PPI #4. History of gastric ulcers #5. History of multiple adenomatous colon polyps per last colonoscopy September 2011   PLAN:  #1. I have prescribed ciprofloxacin 500 mg twice a day for 10 days and metronidazole 500 mg twice a day for 10 days. He may have this prescription on hand should he have recurrent symptoms suggesting diverticulitis and not have access to his physicians (weekends, away traveling, etc) sees. However, he has been instructed to contact the office after initiating therapy for documentation. Once refill provided as well. #2. High-fiber diet with plenty of water #3. MiraLAX as needed to regulate bowels. Particularly important with  upcoming knee surgery to keep bowels regular #4. Continue PPI. Continued to refill when needed #5. Surveillance colonoscopy around September 2016. Patient aware. Recall confirmed #6. Interval follow-up as needed

## 2014-04-18 ENCOUNTER — Ambulatory Visit (INDEPENDENT_AMBULATORY_CARE_PROVIDER_SITE_OTHER): Payer: Medicare Other | Admitting: Cardiovascular Disease

## 2014-04-18 VITALS — BP 132/78 | HR 61 | Ht 68.0 in | Wt 190.5 lb

## 2014-04-18 DIAGNOSIS — Z01810 Encounter for preprocedural cardiovascular examination: Secondary | ICD-10-CM

## 2014-04-18 DIAGNOSIS — E785 Hyperlipidemia, unspecified: Secondary | ICD-10-CM | POA: Diagnosis not present

## 2014-04-18 DIAGNOSIS — Z8719 Personal history of other diseases of the digestive system: Secondary | ICD-10-CM | POA: Diagnosis not present

## 2014-04-18 NOTE — Patient Instructions (Signed)
Your physician wants you to follow-up in:  As needed with [name]. You will receive a reminder letter in the mail two months in advance. If you don't receive a letter, please call our office to schedule the follow-up appointment.

## 2014-04-19 ENCOUNTER — Encounter: Payer: Self-pay | Admitting: Cardiovascular Disease

## 2014-04-19 DIAGNOSIS — E782 Mixed hyperlipidemia: Secondary | ICD-10-CM | POA: Insufficient documentation

## 2014-04-19 DIAGNOSIS — Z01810 Encounter for preprocedural cardiovascular examination: Secondary | ICD-10-CM | POA: Insufficient documentation

## 2014-04-19 DIAGNOSIS — E785 Hyperlipidemia, unspecified: Secondary | ICD-10-CM | POA: Insufficient documentation

## 2014-04-19 NOTE — Progress Notes (Signed)
Patient ID: Carl [name], male   DOB: 1945/10/22, 69 y.o.   MRN: [id number]     HPI: Carl [name] is a 69 y.o. male who presents to the office today for a preoperative cardiology clearance prior to undergoing left knee replacement surgery by Dr. [name].  The patient had seen Dr. Hulan Fess for clearance and is referred for cardiologic assessment.  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  He has a history of mild hyperlipidemia for which she has been on simvastatin.  In 2009 he developed transient numbness of his right face which lasted for several minutes.  At that time, he apparently had a normal MRI, MRA, and carotid Doppler studies showed only mild to moderate plaque in the proximal right internal carotid artery without stenosis.  He did undergo neurologic evaluation.  He has a history of mild depression/anxiety, history of diverticular disease, history of genital herpes, and adenomatous colonic polyps.  He remains active.  He exercises.  He denies any change in exercise tolerance.  He denies any episodes of chest pain.  He denies exertional dyspnea.  He's unaware of palpitations.  He was seen by Dr. Hulan Fess for clearance prior to his knee surgery.  Apparently during that evaluation, an ECG which I have not seen apparently showed vertical P waves and he was felt to have possible junctional rhythm which was new from an ECG of 2012, which had only shown sinus bradycardia.  Carl [name] is asymptomatic.  He denies any presyncope or syncope.  He is referred now for Cardiologic assessment prior to giving clearance for his knee surgery.  Past Medical History  Diagnosis Date  . Diverticulitis   . Diverticulosis   . Esophagitis   . Gastric ulcer   . Hx of adenomatous colonic polyps   . Depression   . Hyperlipidemia     Past Surgical History  Procedure Laterality Date  . Nasal sinus surgery    . Knee surgery      left  . Cervical spine surgery    .  Tonsillectomy    . Foreign body removal esophageal      No Known Allergies  Current Outpatient Prescriptions  Medication Sig Dispense Refill  . ALPRAZolam (XANAX) 0.25 MG tablet Take 0.25 mg by mouth as needed.   0  . bifidobacterium infantis (ALIGN) capsule Take 1 cap daily for 21 days. 14 capsule 0  . citalopram (CELEXA) 10 MG tablet Take 10 mg by mouth daily.    . pantoprazole (PROTONIX) 20 MG tablet Take 2 tablets (40 mg total) by mouth daily. (Patient taking differently: Take 40 mg by mouth as needed. ) 30 tablet 0  . simvastatin (ZOCOR) 40 MG tablet Take 40 mg by mouth every evening.    . valACYclovir (VALTREX) 500 MG tablet Take 500 mg by mouth daily.    Marland Kitchen zolpidem (AMBIEN CR) 12.5 MG CR tablet Take 12.5 mg by mouth at bedtime as needed for sleep.      No current facility-administered medications for this visit.    History   Social History  . Marital Status: Single    Spouse Name: N/A  . Number of Children: 3  . Years of Education: N/A   Occupational History  . Owner    Social History Main Topics  . Smoking status: Never Smoker   . Smokeless tobacco: Never Used  . Alcohol Use: 0.0 oz/week    0 Standard drinks or equivalent per week  Comment: 2 drinks 5 nights per week  . Drug Use: No  . Sexual Activity: Not on file   Other Topics Concern  . Not on file   Social History Narrative   Socially he is divorced.  He has 3 children from his marriage with Harriet Butte.  One son lives in [place] and works for the [employer], another son lives in [place], and his daughter lives in [place].  There is no tobacco history.  He drinks alcohol socially.  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   Family History  Problem Relation Age of Onset  . Colon cancer Paternal Grandfather     questionable   Family history is notable that both parents are deceased.  He has 2 living sisters.  ROS General: Negative; No fevers, chills, or night sweats HEENT:  Negative; No changes in vision or hearing, sinus congestion, difficulty swallowing Pulmonary: Negative; No cough, wheezing, shortness of breath, hemoptysis Cardiovascular: See HPI: No chest pain, presyncope, syncope, palpatations GI: Positive for history of colonic polyps and diverticular disease for which he sees Dr. Scarlette Shorts.; No nausea, vomiting, diarrhea, or abdominal pain GU: Negative; No dysuria, hematuria, or difficulty voiding Musculoskeletal: Positive for left knee pain for which she will require left knee replacement surgery.; no myalgias, joint pain, or weakness Hematologic: Negative; no easy bruising, bleeding Endocrine: Negative; no heat/cold intolerance; no diabetes, Neuro: Negative; no changes in balance, headaches Skin: Negative; No rashes or skin lesions Psychiatric: Negative; No behavioral problems, depression Sleep: Negative; No snoring,  daytime sleepiness, hypersomnolence, bruxism, restless legs, hypnogognic hallucinations. Other comprehensive 14 point system review is negative   Physical Exam BP 132/78 mmHg  Pulse 61  Ht 5' 8"  (1.727 m)  Wt 190 lb 8 oz (86.41 kg)  BMI 28.97 kg/m2 General: Alert, oriented, no distress.  Skin: normal turgor, no rashes, warm and dry HEENT: Normocephalic, atraumatic. Pupils equal round and reactive to light; sclera anicteric; extraocular muscles intact, No lid lag; Nose without nasal septal hypertrophy; Mouth/Parynx benign; Mallinpatti scale 2/3 Neck: No JVD, no carotid bruits; normal carotid upstroke Lungs: clear to ausculatation and percussion bilaterally; no wheezing or rales, normal inspiratory and expiratory effort Chest wall: without tenderness to palpitation Heart: PMI not displaced, RRR, s1 s2 normal, no systolic murmur, No diastolic murmur, no rubs, gallops, thrills, or heaves Abdomen: soft, nontender; no hepatosplenomehaly, BS+; abdominal aorta nontender and not dilated by palpation. Back: no CVA tenderness Pulses: 2+    Musculoskeletal: full range of motion, normal strength, no joint deformities Extremities: Pulses 2+, no clubbing cyanosis or edema, Homan's sign negative  Neurologic: grossly nonfocal; Cranial nerves grossly wnl Psychologic: Normal mood and affect   ECG (independently read by me): Normal sinus rhythm at 61 bpm.  Normal intervals.  On this present ECG his P waves are normal and upright inferiorly.  Normal intervals.  No ectopy.  LABS:  BMP Latest Ref Rng 01/15/2014 12/25/2012 01/29/2007  Glucose 70 - 99 mg/dL 105(H) 102(H) 87  BUN 6 - 23 mg/dL 16 18 8   Creatinine 0.4 - 1.5 mg/dL 1.0 0.95 0.95  Sodium 135 - 145 mEq/L 135 136 136  Potassium 3.5 - 5.1 mEq/L 4.6 3.9 3.9  Chloride 96 - 112 mEq/L 100 101 104  CO2 19 - 32 mEq/L 29 21 27   Calcium 8.4 - 10.5 mg/dL 9.2 9.9 8.6     Hepatic Function Latest Ref Rng 01/15/2014 12/25/2012 01/29/2007  Total Protein 6.0 - 8.3 g/dL 6.9  7.0 5.5(L)  Albumin 3.5 - 5.2 g/dL 4.0 3.6 3.3(L)  AST 0 - 37 U/L 27 39(H) 34  ALT 0 - 53 U/L 25 41 25  Alk Phosphatase 39 - 117 U/L 69 69 47  Total Bilirubin 0.2 - 1.2 mg/dL 0.6 0.1(L) 0.8     CBC Latest Ref Rng 01/15/2014 12/25/2012 01/28/2007  WBC 4.0 - 10.5 K/uL 12.5(H) 7.5 -  Hemoglobin 13.0 - 17.0 g/dL 14.6 15.6 16.7  Hematocrit 39.0 - 52.0 % 42.7 44.3 49.0  Platelets 150.0 - 400.0 K/uL 262.0 253 -     BNP No results found for: BNP  ProBNP No results found for: PROBNP   Lipid Panel     Component Value Date/Time   CHOL  01/29/2007 0410    193        ATP III CLASSIFICATION:  <200     mg/dL   Desirable  200-239  mg/dL   Borderline High  >=240    mg/dL   High   TRIG 83 01/29/2007 0410   HDL 57 01/29/2007 0410   CHOLHDL 3.4 01/29/2007 0410   VLDL 17 01/29/2007 0410   LDLCALC * 01/29/2007 0410    119        Total Cholesterol/HDL:CHD Risk Coronary Heart Disease Risk Table                     Men   Women  1/2 Average Risk   3.4   3.3   He tells me he recently had a lipid panel done by Dr.  Hulan Fess.  I do not have these results.  He was told that his lab was very good on his current dose of simvastatin.   RADIOLOGY: No results found.    ASSESSMENT AND PLAN: Mr. Destiny Trickey is a 69 year old gentleman without significant cardiac history with the exception of mild hyperlipidemia for which he has been on lipid lowering therapy with simvastatin and has tolerated this well.  He is asymptomatic.  Specifically, he denies any chest pain.  He denies any change in exercise capacity.  There is no exertional shortness of breath, palpitations, presyncope or syncope.  His ECG is normal with upright P waves in all 12 leads.  Remotely in the past he has had mild bradycardia.  Carotid studies in the past have shown mild plaque. His blood pressure today is excellent and on repeat by me was 120/76.  I suspect the ECG done at Dr. Eddie Dibbles office revealed a transient low atrial rhythm with inverted P waves rather than a true nodal rhythm.  I feel he is stable from a cardiovascular standpoint to undergo planned total knee replacement by Dr. Durward Fortes.  We will contact Dr. Rudene Anda office to provide clearance.     Troy Sine, MD, [facility]  04/19/2014 12:38 PM

## 2014-05-07 DIAGNOSIS — M1712 Unilateral primary osteoarthritis, left knee: Secondary | ICD-10-CM | POA: Diagnosis not present

## 2014-05-14 NOTE — H&P (Signed)
CHIEF COMPLAINT:   Painful left knee.    HISTORY OF PRESENT ILLNESS:  Carl [name] is a very pleasant 69 year old white male who is seen today for evaluation of his left knee.  He has had pain in the left knee for many years.  He has undergone arthroscopic debridement in the past for this.  He states he has had several scopes on the knee, which have been beneficial at the time, but he is now persistently having pain and discomfort.  He did have a traumatic episode 18 years ago when he fell off a scaffolding, as he is a Museum/gallery curator.  He did have multiple cortisone injections, but has gotten now to the point where they are not as beneficial.  He is now having pain with every step, nighttime pain.  It is interfering with his activities of daily living and what he likes to do for recreation.  He has not gotten to the point where he has had to use a cane, but certainly the pain has been bad enough for this.  Again, he has been tried with corticosteroid injections and surgery and now is to the point where he is having difficulty with his activities of daily living.  Seen today for evaluation.     PAST MEDICAL HISTORY:  In general, his health is good.     HOSPITALIZATIONS:   1.  Have included surgeries of 2 left knee scopes. 2.  He has had a diskectomy by Dr. Carloyn Manner at [facility]. 3.  He has also had surgery on 3 fingers on his left hand.    MEDICATIONS:   1.  Simvastatin 40 mg daily 2.  Zolpidem 10 mg daily at bedtime. 3.  Acyclovir 400 mg daily.   ALLERGIES:  NONE KNOWN.    FAMILY HISTORY:  Positive for mother who died of emphysema at 61 years of age and she was a smoker.  Father died at age 35 from old age.  He has no brothers.  He has 2 sisters who are very healthy at ages 70 and 47.     SOCIAL HISTORY:  A 69 year old white divorced male, who denies use of tobacco.  He drinks 5 days a week, 2 drinks a day.     REVIEW OF SYSTEMS:  A 14-point review of systems has been completely denied.     PHYSICAL EXAMINATION:   Examination today reveals a 69 year old white male, well developed,well nourished, alert, cooperative, in moderate distress secondary to left knee pain.  Height is 69 inches, weight 194.  BMI 28.6.   Vital signs:  Temperature 96.3, pulse 16, respirations 60, blood pressure 128/78.   Head:  Normocephalic.  Eyes:  Pupils equal, round and reactive to light and accommodation with extraocular movements intact.  Ears, nose and throat:  Benign. Neck:  Supple.  No carotid bruits.  Chest:  Good expansion.   Lungs:  Essentially clear. Cardiac:   Regular rhythm and rate.  Normal S1, S2.  Bradycardic, without murmurs.  Occasional ectopic.   Pulses:  1+ bilateral and symmetric in the lower extremity.   Abdomen:  Scaphoid, soft, nontender.  No masses palpable.  Normal bowel sounds present.   Genitorectal/breast:  Exam not indicated for an orthopedic evaluation.   CNS:  He is oriented x3.  Cranial nerves II-XII grossly intact.   Musculoskeletal:  He has range of motion from about 10 degrees shy of full extension to about 100 degrees flexion.  He has marked periarticular spurring more on the medial aspect of  his tibia.  There is crepitation with range of motion.  He has a little bit of laxity with varus and valgus stressing, but he does have endpoint.  Calf is supple, nontender.  He is neurovascularly intact distally.      RADIOGRAPHS:  X-rays reveal bone-on-bone medial and lateral compartment with translation of the tibia laterally on the femur.  Periarticular spurring in all 3 compartments noted.  His patellofemoral joint is very degenerative with multiple spurs.  Sunrise view shows significant OA.     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     RECOMMENDATIONS:  At this time, I have reviewed a note from [name] stating that he is a low risk from cardiac standpoint.  Therefore, at this time, we are going to proceed with total knee replacement on the left.  Procedure risks and benefits were fully  explained to him in detail and appropriate models were used.  All questions were answered in detail.  We have discussed this and it took 45 minutes of time, including at least 50% of that with counseling and description of the procedure.  We will plan for this in the very near future.     Mike Craze [place], [place] [phone]  05/14/2014 11:18 AM

## 2014-05-16 ENCOUNTER — Encounter (HOSPITAL_COMMUNITY): Payer: Self-pay

## 2014-05-16 ENCOUNTER — Encounter (HOSPITAL_COMMUNITY)
Admission: RE | Admit: 2014-05-16 | Discharge: 2014-05-16 | Disposition: A | Discharge status: Home / Self Care | Payer: Medicare Other | Source: Ambulatory Visit | Attending: Orthopaedic Surgery | Admitting: Orthopaedic Surgery

## 2014-05-16 ENCOUNTER — Encounter (HOSPITAL_COMMUNITY)
Admission: RE | Admit: 2014-05-16 | Discharge: 2014-05-16 | Disposition: A | Discharge status: Home / Self Care | Payer: Medicare Other | Source: Ambulatory Visit | Attending: Orthopedic Surgery | Admitting: Orthopedic Surgery

## 2014-05-16 DIAGNOSIS — Z0183 Encounter for blood typing: Secondary | ICD-10-CM | POA: Diagnosis not present

## 2014-05-16 DIAGNOSIS — M179 Osteoarthritis of knee, unspecified: Secondary | ICD-10-CM | POA: Insufficient documentation

## 2014-05-16 DIAGNOSIS — Z01818 Encounter for other preprocedural examination: Secondary | ICD-10-CM | POA: Diagnosis not present

## 2014-05-16 DIAGNOSIS — J449 Chronic obstructive pulmonary disease, unspecified: Secondary | ICD-10-CM | POA: Diagnosis not present

## 2014-05-16 DIAGNOSIS — Z01812 Encounter for preprocedural laboratory examination: Secondary | ICD-10-CM | POA: Diagnosis not present

## 2014-05-16 DIAGNOSIS — Z79899 Other long term (current) drug therapy: Secondary | ICD-10-CM | POA: Diagnosis not present

## 2014-05-16 HISTORY — DX: Gastro-esophageal reflux disease without esophagitis: K21.9

## 2014-05-16 HISTORY — DX: Unspecified osteoarthritis, unspecified site: M19.90

## 2014-05-16 HISTORY — DX: Effusion, unspecified joint: M25.40

## 2014-05-16 HISTORY — DX: Insomnia, unspecified: G47.00

## 2014-05-16 HISTORY — DX: Anxiety disorder, unspecified: F41.9

## 2014-05-16 HISTORY — DX: Pain in unspecified joint: M25.50

## 2014-05-16 HISTORY — DX: Benign prostatic hyperplasia without lower urinary tract symptoms: N40.0

## 2014-05-16 LAB — COMPREHENSIVE METABOLIC PANEL
ALK PHOS: 72 U/L (ref 39–117)
ALT: 29 U/L (ref 0–53)
ANION GAP: 7 (ref 5–15)
AST: 37 U/L (ref 0–37)
Albumin: 3.8 g/dL (ref 3.5–5.2)
BILIRUBIN TOTAL: 0.9 mg/dL (ref 0.3–1.2)
BUN: 12 mg/dL (ref 6–23)
CO2: 28 mmol/L (ref 19–32)
Calcium: 9.2 mg/dL (ref 8.4–10.5)
Chloride: 102 mmol/L (ref 96–112)
Creatinine, Ser: 1.05 mg/dL (ref 0.50–1.35)
GFR calc Af Amer: 82 mL/min — ABNORMAL LOW (ref >90)
GFR, EST NON AFRICAN AMERICAN: 70 mL/min — AB (ref >90)
Glucose, Bld: 102 mg/dL — ABNORMAL HIGH (ref 70–99)
POTASSIUM: 5 mmol/L (ref 3.5–5.1)
SODIUM: 137 mmol/L (ref 135–145)
Total Protein: 7 g/dL (ref 6.0–8.3)

## 2014-05-16 LAB — CBC WITH DIFFERENTIAL/PLATELET
BASOS PCT: 0 % (ref 0–1)
Basophils Absolute: 0 10*3/uL (ref 0.0–0.1)
Eosinophils Absolute: 0.2 10*3/uL (ref 0.0–0.7)
Eosinophils Relative: 2 % (ref 0–5)
HCT: 46 % (ref 39.0–52.0)
Hemoglobin: 15.7 g/dL (ref 13.0–17.0)
LYMPHS PCT: 14 % (ref 12–46)
Lymphs Abs: 1.1 10*3/uL (ref 0.7–4.0)
MCH: 32.5 pg (ref 26.0–34.0)
MCHC: 34.1 g/dL (ref 30.0–36.0)
MCV: 95.2 fL (ref 78.0–100.0)
MONOS PCT: 15 % — AB (ref 3–12)
Monocytes Absolute: 1.2 10*3/uL — ABNORMAL HIGH (ref 0.1–1.0)
Neutro Abs: 5.8 10*3/uL (ref 1.7–7.7)
Neutrophils Relative %: 69 % (ref 43–77)
PLATELETS: 215 10*3/uL (ref 150–400)
RBC: 4.83 MIL/uL (ref 4.22–5.81)
RDW: 13.5 % (ref 11.5–15.5)
WBC: 8.4 10*3/uL (ref 4.0–10.5)

## 2014-05-16 LAB — PROTIME-INR
INR: 1.07 (ref 0.00–1.49)
Prothrombin Time: 14 seconds (ref 11.6–15.2)

## 2014-05-16 LAB — SURGICAL PCR SCREEN
MRSA, PCR: NEGATIVE
STAPHYLOCOCCUS AUREUS: NEGATIVE

## 2014-05-16 LAB — APTT: aPTT: 31 seconds (ref 24–37)

## 2014-05-16 LAB — TYPE AND SCREEN
ABO/RH(D): A NEG
ANTIBODY SCREEN: NEGATIVE

## 2014-05-16 LAB — ABO/RH: ABO/RH(D): A NEG

## 2014-05-16 MED ORDER — CHLORHEXIDINE GLUCONATE 4 % EX LIQD: 60.0000 mL | Freq: Once | CUTANEOUS | Status: DC

## 2014-05-16 MED ORDER — CHLORHEXIDINE GLUCONATE 4 % EX LIQD
60.0000 mL | Freq: Once | CUTANEOUS | Status: DC
Start: 2014-05-16 — End: 2014-05-17

## 2014-05-16 NOTE — Progress Notes (Addendum)
Dr.Thomas Claiborne Billings is cardiologist with clearance note in epic from 04/2014  Echo report in epic from 2008  EKG in epic from 04-18-14  Denies ever having a stress test/heart cath  Medical Md is Dr.Kevin Little  Denies CXR in past yr

## 2014-05-16 NOTE — Pre-Procedure Instructions (Signed)
Carl [name]  05/16/2014   Your procedure is scheduled on:  Tues, April 19 @ 7:15 AM  Report to [facility]  at 5:30 AM.  Call this number if you have problems the morning of surgery: [phone]   Remember:   Do not eat food or drink liquids after midnight.   Take these medicines the morning of surgery with A SIP OF WATER: Xanax(Alprazolam),Celexa(Citalopram),Pantoprazole(Protonix),and Valtrex(Valacyclovir)              Stop taking your Ibuprofen. No Goody's,BC's,Aleve,Aspirin,Fish Oil,or any Herbal Medications.    Do not wear jewelry.  Do not wear lotions, powders, or colognes. You may wear deodorant.  Men may shave face and neck.  Do not bring valuables to the hospital.  [facility] is not responsible                  for any belongings or valuables.               Contacts, dentures or bridgework may not be worn into surgery.  Leave suitcase in the car. After surgery it may be brought to your room.  For patients admitted to the hospital, discharge time is determined by your                treatment team.               Special Instructions:  [place] - Preparing for Surgery  Before surgery, you can play an important role.  Because skin is not sterile, your skin needs to be as free of germs as possible.  You can reduce the number of germs on you skin by washing with CHG (chlorahexidine gluconate) soap before surgery.  CHG is an antiseptic cleaner which kills germs and bonds with the skin to continue killing germs even after washing.  Please DO NOT use if you have an allergy to CHG or antibacterial soaps.  If your skin becomes reddened/irritated stop using the CHG and inform your nurse when you arrive at Short Stay.  Do not shave (including legs and underarms) for at least 48 hours prior to the first CHG shower.  You may shave your face.  Please follow these instructions carefully:   1.  Shower with CHG Soap the night before surgery and the                                 morning of Surgery.  2.  If you choose to wash your hair, wash your hair first as usual with your       normal shampoo.  3.  After you shampoo, rinse your hair and body thoroughly to remove the                      Shampoo.  4.  Use CHG as you would any other liquid soap.  You can apply chg directly       to the skin and wash gently with scrungie or a clean washcloth.  5.  Apply the CHG Soap to your body ONLY FROM THE NECK DOWN.        Do not use on open wounds or open sores.  Avoid contact with your eyes,       ears, mouth and genitals (private parts).  Wash genitals (private parts)       with your normal soap.  6.  Wash thoroughly,  paying special attention to the area where your surgery        will be performed.  7.  Thoroughly rinse your body with warm water from the neck down.  8.  DO NOT shower/wash with your normal soap after using and rinsing off       the CHG Soap.  9.  Pat yourself dry with a clean towel.            10.  Wear clean pajamas.            11.  Place clean sheets on your bed the night of your first shower and do not        sleep with pets.  Day of Surgery  Do not apply any lotions/deoderants the morning of surgery.  Please wear clean clothes to the hospital/surgery center.     Please read over the following fact sheets that you were given: Pain Booklet, Coughing and Deep Breathing, Blood Transfusion Information, MRSA Information and Surgical Site Infection Prevention

## 2014-05-20 ENCOUNTER — Telehealth: Payer: Self-pay | Admitting: Internal Medicine

## 2014-05-20 ENCOUNTER — Ambulatory Visit: Payer: 59 | Admitting: Cardiovascular Disease

## 2014-05-20 NOTE — Telephone Encounter (Signed)
Please refill Cipro and Flagyl 500 mg twice daily for each 10 days. Also, set him up for contrast-enhanced CT scan of the abdomen and pelvis "rule out diverticulitis".

## 2014-05-20 NOTE — Telephone Encounter (Signed)
Pt states he started having problems with diverticulitis on Saturday. Pt states he had a script for Cipro and Flagyl, states he started taking the meds on Saturday. Pt states he is not much better. Reports he is trying to stick to low residue diet. Pt is having knee replacement surgery in one week and wants to make sure he is ok for surgery. Please advise.

## 2014-05-21 ENCOUNTER — Other Ambulatory Visit: Payer: Self-pay

## 2014-05-21 ENCOUNTER — Ambulatory Visit (INDEPENDENT_AMBULATORY_CARE_PROVIDER_SITE_OTHER)
Admission: RE | Admit: 2014-05-21 | Discharge: 2014-05-21 | Disposition: A | Discharge status: Home / Self Care | Payer: Medicare Other | Source: Ambulatory Visit | Attending: Internal Medicine | Admitting: Internal Medicine

## 2014-05-21 DIAGNOSIS — K5730 Diverticulosis of large intestine without perforation or abscess without bleeding: Secondary | ICD-10-CM | POA: Diagnosis not present

## 2014-05-21 DIAGNOSIS — R1084 Generalized abdominal pain: Secondary | ICD-10-CM

## 2014-05-21 MED ORDER — CIPROFLOXACIN HCL 500 MG PO TABS: 500.0000 mg | ORAL_TABLET | Freq: Two times a day (BID) | ORAL | Status: DC

## 2014-05-21 MED ORDER — IOHEXOL 300 MG/ML  SOLN: 100.0000 mL | Freq: Once | INTRAMUSCULAR | Status: AC | PRN

## 2014-05-21 MED ORDER — METRONIDAZOLE 500 MG PO TABS: 500.0000 mg | ORAL_TABLET | Freq: Two times a day (BID) | ORAL | Status: DC

## 2014-05-21 NOTE — Progress Notes (Addendum)
Pt called to report that per his Gastroenterologist, he would be cancelling surgery for 05/27/14 due to diverticulitis. Pt was instructed to call Dr. Rudene Anda office in the morning and that they would take care of cancelling/rescheduling surgery, understanding verbalized.

## 2014-05-21 NOTE — Telephone Encounter (Signed)
Pt aware, scripts sent to pharmacy. Pt scheduled for CT of A/P at [facility] CT for today at 1pm. Pt to arrive there at 12:45pm. Pt to be NPO after 9am, drink bottle 1 of contrast at 11am, bottle 2 at 12noon. Pt aware.

## 2014-05-27 ENCOUNTER — Inpatient Hospital Stay (HOSPITAL_COMMUNITY): Admission: RE | Admit: 2014-05-27 | Payer: Medicare Other | Source: Ambulatory Visit | Admitting: Orthopaedic Surgery

## 2014-05-27 ENCOUNTER — Encounter (HOSPITAL_COMMUNITY): Admission: RE | Payer: Self-pay | Source: Ambulatory Visit

## 2014-05-27 SURGERY — ARTHROPLASTY, KNEE, TOTAL
Anesthesia: General | Site: Knee | Laterality: Left

## 2014-06-03 ENCOUNTER — Telehealth: Payer: Self-pay | Admitting: Internal Medicine

## 2014-06-03 ENCOUNTER — Other Ambulatory Visit (INDEPENDENT_AMBULATORY_CARE_PROVIDER_SITE_OTHER): Payer: Medicare Other

## 2014-06-03 ENCOUNTER — Other Ambulatory Visit: Payer: Self-pay

## 2014-06-03 DIAGNOSIS — R195 Other fecal abnormalities: Secondary | ICD-10-CM

## 2014-06-03 DIAGNOSIS — R1032 Left lower quadrant pain: Secondary | ICD-10-CM

## 2014-06-03 LAB — CBC WITH DIFFERENTIAL/PLATELET
Basophils Absolute: 0 10*3/uL (ref 0.0–0.1)
Basophils Relative: 0.4 % (ref 0.0–3.0)
EOS PCT: 3.4 % (ref 0.0–5.0)
Eosinophils Absolute: 0.3 10*3/uL (ref 0.0–0.7)
HEMATOCRIT: 41.4 % (ref 39.0–52.0)
HEMOGLOBIN: 14.4 g/dL (ref 13.0–17.0)
LYMPHS ABS: 1.3 10*3/uL (ref 0.7–4.0)
LYMPHS PCT: 17.3 % (ref 12.0–46.0)
MCHC: 34.8 g/dL (ref 30.0–36.0)
MCV: 93.5 fl (ref 78.0–100.0)
MONOS PCT: 10.1 % (ref 3.0–12.0)
Monocytes Absolute: 0.8 10*3/uL (ref 0.1–1.0)
NEUTROS ABS: 5.3 10*3/uL (ref 1.4–7.7)
Neutrophils Relative %: 68.8 % (ref 43.0–77.0)
Platelets: 278 10*3/uL (ref 150.0–400.0)
RBC: 4.43 Mil/uL (ref 4.22–5.81)
RDW: 13.8 % (ref 11.5–15.5)
WBC: 7.8 10*3/uL (ref 4.0–10.5)

## 2014-06-03 NOTE — Telephone Encounter (Signed)
Repeat CT "follow-up diverticulitis", place on daily PPI if not on one currently (omeprazole 40 mg daily is fine), and have him come in for CBC and stool Hemoccults to be done this week.. Have him see me in the office on Monday or Wednesday next week.

## 2014-06-03 NOTE — Telephone Encounter (Signed)
Pt states that he finished the cipro and flagyl today, reports he is much better. Does state that once in a while he will feel a little discomfort on his left side of his abdomen. Pt states he does have to eat often or his stomach hurts and he has noticed that his stools are quite dark. Pt states he has not taken any pepto bismol. He has rescheduled his knee surgery for 2 weeks from now. Pt wants to know if he should take more antibiotics or just watch for now. Please advise.

## 2014-06-03 NOTE — Telephone Encounter (Signed)
Pt aware and will come for labs today and to pick up his contrast. CT of A/P scheduled at [facility] CT 06/06/14@9 :30am. Pt to be NPO  After midnight, drink bottle 1 of contrast at 7:30am, bottle 2 at 8:30am. Pt scheduled to see Dr. Henrene Pastor 06/09/14@10 :30am. Pt aware of appts.

## 2014-06-06 ENCOUNTER — Ambulatory Visit (INDEPENDENT_AMBULATORY_CARE_PROVIDER_SITE_OTHER)
Admission: RE | Admit: 2014-06-06 | Discharge: 2014-06-06 | Disposition: A | Discharge status: Home / Self Care | Payer: Medicare Other | Source: Ambulatory Visit | Attending: Internal Medicine | Admitting: Internal Medicine

## 2014-06-06 DIAGNOSIS — K5732 Diverticulitis of large intestine without perforation or abscess without bleeding: Secondary | ICD-10-CM | POA: Diagnosis not present

## 2014-06-06 DIAGNOSIS — R1032 Left lower quadrant pain: Secondary | ICD-10-CM | POA: Diagnosis not present

## 2014-06-06 MED ORDER — IOHEXOL 300 MG/ML  SOLN
100.0000 mL | Freq: Once | INTRAMUSCULAR | Status: AC | PRN
  Administered 2014-06-06: 100 mL via INTRAVENOUS

## 2014-06-09 ENCOUNTER — Encounter: Payer: Self-pay | Admitting: Internal Medicine

## 2014-06-09 ENCOUNTER — Ambulatory Visit (INDEPENDENT_AMBULATORY_CARE_PROVIDER_SITE_OTHER): Payer: Medicare Other | Admitting: Internal Medicine

## 2014-06-09 VITALS — BP 118/74 | HR 60 | Ht 68.5 in | Wt 194.0 lb

## 2014-06-09 DIAGNOSIS — K5901 Slow transit constipation: Secondary | ICD-10-CM | POA: Diagnosis not present

## 2014-06-09 DIAGNOSIS — Z86010 Personal history of colonic polyps: Secondary | ICD-10-CM

## 2014-06-09 DIAGNOSIS — K2100 Gastro-esophageal reflux disease with esophagitis, without bleeding: Secondary | ICD-10-CM

## 2014-06-09 DIAGNOSIS — K5732 Diverticulitis of large intestine without perforation or abscess without bleeding: Secondary | ICD-10-CM | POA: Diagnosis not present

## 2014-06-09 NOTE — Patient Instructions (Signed)
Please follow up as needed 

## 2014-06-09 NOTE — Progress Notes (Signed)
HISTORY OF PRESENT ILLNESS:  ANYELO [name] is a 69 y.o. male with a history of recurrent diverticulitis, adenomatous colon polyps, GERD with esophagitis, gastric ulcers, and general medical problems as listed below. I last saw the patient 04/10/2014 in follow-up after recovering from an episode of acute diverticulitis. He developed recurrent symptoms the following month. CT scan showed acute uncomplicated diverticulitis. He was treated with 2 courses of antibiotic. Elective knee surgery canceled. He continues with chronic constipation for which she takes magnesium supplement that helps. Follow-up CT scan last week showed resolution of diverticulitis. He is feeling better. No new complaints. He does have questions regarding his diet and bowel habits.  REVIEW OF SYSTEMS:  All non-GI ROS negative except for knee pain  Past Medical History  Diagnosis Date  . Diverticulitis   . Diverticulosis   . Esophagitis   . Gastric ulcer   . Hx of adenomatous colonic polyps     benign  . Hyperlipidemia     takes Zocor daily  . Anxiety     takes Xanax daily as needed  . Insomnia     takes Ambien nightly  . GERD (gastroesophageal reflux disease)     takes Protonix daily  . Depression     takes Celexa daily  . Arthritis   . Joint pain   . Joint swelling   . Enlarged prostate     sightly    Past Surgical History  Procedure Laterality Date  . Nasal sinus surgery    . Knee surgery Left     couple of times  . Cervical spine surgery    . Tonsillectomy    . Foreign body removal esophageal    . Cartliage removed from nose  64yrs ago  . Cataract surgery Bilateral   . Colonoscopy      Social History SLY PARLEE  reports that he has never smoked. He has never used smokeless tobacco. He reports that he drinks alcohol. He reports that he does not use illicit drugs.  family history includes Colon cancer in his paternal grandfather.  No Known Allergies     PHYSICAL EXAMINATION:  Vital signs: BP  118/74 mmHg  Pulse 60  Ht 5' 8.5" (1.74 m)  Wt 194 lb (87.998 kg)  BMI 29.07 kg/m2 General: Well-developed, well-nourished, no acute distress HEENT: Sclerae are anicteric, conjunctiva pink. Oral mucosa intact Lungs: Clear Heart: Regular Abdomen: soft, nontender, nondistended, no obvious ascites, no peritoneal signs, normal bowel sounds. No organomegaly. Extremities: No edema Psychiatric: alert and oriented x3. Cooperative   ASSESSMENT:  #1. Recurrent acute diverticulitis. Resolved. Has required antibiotics for times over the past year. Initial bout 2005 with microperforation #2. Functional constipation. Ongoing #3. GERD. Asymptomatic on PPI #4. History of adenomatous colon polyps. Due for surveillance September 2016   PLAN:  #1. High-fiber diet #2. Keep bowels regular. Particularly during perioperative phase #3. Continue PPI #4. Surveillance colonoscopy this fall. Interval follow-up as needed  15 minutes was spent face-to-face with this patient. Greater than half the time spent counseling regarding his GI diagnoses and plans as outlined above

## 2014-06-10 ENCOUNTER — Ambulatory Visit: Payer: 59 | Admitting: Cardiovascular Disease

## 2014-06-15 NOTE — H&P (Signed)
CHIEF COMPLAINT:   Painful left knee.    HISTORY OF PRESENT ILLNESS:  Carl [name] is a very pleasant 69 year old white male who is seen today for evaluation of his left knee.  He has had pain in the left knee for many years.  He has undergone arthroscopic debridement in the past for this.  He states he has had several scopes on the knee, which have been beneficial at the time, but he is now persistently having pain and discomfort.  He did have a traumatic episode 18 years ago when he fell off a scaffolding, as he is a Museum/gallery curator.  He did have multiple cortisone injections, but has gotten now to the point where they are not as beneficial.  He is now having pain with every step, nighttime pain.  It is interfering with his activities of daily living and what he likes to do for recreation.  He has not gotten to the point where he has had to use a cane, but certainly the pain has been bad enough for this.  Again, he has been tried with corticosteroid injections and surgery and now is to the point where he is having difficulty with his activities of daily living.  Seen today for evaluation.     PAST MEDICAL HISTORY:  In general, his health is good.     HOSPITALIZATIONS:   1.  Have included surgeries of 2 left knee scopes. 2.  He has had a diskectomy by Dr. Carloyn Manner at [facility]. 3.  He has also had surgery on 3 fingers on his left hand.    MEDICATIONS:   1.  Simvastatin 40 mg daily 2.  Zolpidem 10 mg daily at bedtime. 3.  Acyclovir 400 mg daily.   ALLERGIES:  NONE KNOWN.    FAMILY HISTORY:  Positive for mother who died of emphysema at 76 years of age and she was a smoker.  Father died at age 60 from old age.  He has no brothers.  He has 2 sisters who are very healthy at ages 47 and 7.     SOCIAL HISTORY:  A 69 year old white divorced male, who denies use of tobacco.  He drinks 5 days a week, 2 drinks a day.     REVIEW OF SYSTEMS:  A 14-point review of systems has been completely denied.     PHYSICAL EXAMINATION:   Examination today reveals a 69 year old white male, well developed,well nourished, alert, cooperative, in moderate distress secondary to left knee pain.  Height is 69 inches, weight 194.  BMI 28.6.   Vital signs:  Temperature 96.3, pulse 16, respirations 60, blood pressure 128/78.   Head:  Normocephalic.  Eyes:  Pupils equal, round and reactive to light and accommodation with extraocular movements intact.  Ears, nose and throat:  Benign. Neck:  Supple.  No carotid bruits.  Chest:  Good expansion.   Lungs:  Essentially clear. Cardiac:   Regular rhythm and rate.  Normal S1, S2.  Bradycardic, without murmurs.  Occasional ectopic.   Pulses:  1+ bilateral and symmetric in the lower extremity.   Abdomen:  Scaphoid, soft, nontender.  No masses palpable.  Normal bowel sounds present.   Genitorectal/breast:  Exam not indicated for an orthopedic evaluation.   CNS:  He is oriented x3.  Cranial nerves II-XII grossly intact.   Musculoskeletal:  He has range of motion from about 10 degrees shy of full extension to about 100 degrees flexion.  He has marked periarticular spurring more on the medial aspect of  his tibia.  There is crepitation with range of motion.  He has a little bit of laxity with varus and valgus stressing, but he does have endpoint.  Calf is supple, nontender.  He is neurovascularly intact distally.      RADIOGRAPHS:  X-rays reveal bone-on-bone medial and lateral compartment with translation of the tibia laterally on the femur.  Periarticular spurring in all 3 compartments noted.  His patellofemoral joint is very degenerative with multiple spurs.  Sunrise view shows significant OA.     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       Recent Diverticulitis    RECOMMENDATIONS:  At this time, I have reviewed a note from [name] stating that he is a low risk from cardiac standpoint.  His recent diverticulitis has improved.  Therefore, at this time, we are going to proceed with total  knee replacement on the left.  Procedure risks and benefits were fully explained to him in detail and appropriate models were used.  All questions were answered in detail.  We have discussed this and it took 45 minutes of time, including at least 50% of that with counseling and description of the procedure.  We will plan for this in the very near future...  Mike Craze [place], [place] [phone]  06/15/2014 11:13 PM

## 2014-06-16 ENCOUNTER — Encounter (HOSPITAL_COMMUNITY): Payer: Self-pay | Admitting: *Deleted

## 2014-06-16 MED ORDER — CEFAZOLIN SODIUM-DEXTROSE 2-3 GM-% IV SOLR
2.0000 g | INTRAVENOUS | Status: DC
  Filled 2014-06-16: qty 50

## 2014-06-16 MED ORDER — ACETAMINOPHEN 10 MG/ML IV SOLN
1000.0000 mg | Freq: Once | INTRAVENOUS | Status: AC
  Administered 2014-06-17: 1000 mg via INTRAVENOUS
  Filled 2014-06-16: qty 100

## 2014-06-16 NOTE — Progress Notes (Signed)
Pt denies SOB and chest pain.  Pt made aware to stop taking Aspirin, otc vitamins and herbal medications. Do not take any NSAIDs ie: Ibuprofen, Advil, Naproxen or any medication containing Aspirin. Pt reminded to shower tonight and tomorrow morning with HCG. Pt verbalizes understanding of all pre-op instructions.

## 2014-06-17 ENCOUNTER — Inpatient Hospital Stay (HOSPITAL_COMMUNITY)
Admission: RE | Admit: 2014-06-17 | Discharge: 2014-06-19 | DRG: 470 | Disposition: A | Discharge status: Home / Self Care | Payer: Medicare Other | Source: Ambulatory Visit | Attending: Orthopaedic Surgery | Admitting: Orthopaedic Surgery

## 2014-06-17 ENCOUNTER — Inpatient Hospital Stay (HOSPITAL_COMMUNITY): Payer: Medicare Other | Admitting: Certified Registered Nurse Anesthetist

## 2014-06-17 ENCOUNTER — Encounter (HOSPITAL_COMMUNITY)
Admission: RE | Disposition: A | Discharge status: Home / Self Care | Payer: Self-pay | Source: Ambulatory Visit | Attending: Orthopaedic Surgery

## 2014-06-17 ENCOUNTER — Encounter (HOSPITAL_COMMUNITY): Payer: Self-pay | Admitting: *Deleted

## 2014-06-17 DIAGNOSIS — M179 Osteoarthritis of knee, unspecified: Secondary | ICD-10-CM | POA: Diagnosis not present

## 2014-06-17 DIAGNOSIS — G8918 Other acute postprocedural pain: Secondary | ICD-10-CM | POA: Diagnosis not present

## 2014-06-17 DIAGNOSIS — M1712 Unilateral primary osteoarthritis, left knee: Secondary | ICD-10-CM | POA: Diagnosis present

## 2014-06-17 DIAGNOSIS — M25562 Pain in left knee: Secondary | ICD-10-CM | POA: Diagnosis not present

## 2014-06-17 DIAGNOSIS — E785 Hyperlipidemia, unspecified: Secondary | ICD-10-CM | POA: Diagnosis present

## 2014-06-17 DIAGNOSIS — Z96659 Presence of unspecified artificial knee joint: Secondary | ICD-10-CM

## 2014-06-17 DIAGNOSIS — K219 Gastro-esophageal reflux disease without esophagitis: Secondary | ICD-10-CM | POA: Diagnosis present

## 2014-06-17 HISTORY — PX: TOTAL KNEE ARTHROPLASTY: SHX125

## 2014-06-17 LAB — TYPE AND SCREEN
ABO/RH(D): A NEG
ANTIBODY SCREEN: NEGATIVE

## 2014-06-17 LAB — URINALYSIS, ROUTINE W REFLEX MICROSCOPIC
Bilirubin Urine: NEGATIVE
Glucose, UA: NEGATIVE mg/dL
Hgb urine dipstick: NEGATIVE
Ketones, ur: NEGATIVE mg/dL
Leukocytes, UA: NEGATIVE
Nitrite: NEGATIVE
Protein, ur: NEGATIVE mg/dL
SPECIFIC GRAVITY, URINE: 1.017 (ref 1.005–1.030)
Urobilinogen, UA: 0.2 mg/dL (ref 0.0–1.0)
pH: 5.5 (ref 5.0–8.0)

## 2014-06-17 LAB — CBC WITH DIFFERENTIAL/PLATELET
BASOS ABS: 0.1 10*3/uL (ref 0.0–0.1)
BASOS PCT: 1 % (ref 0–1)
EOS PCT: 6 % — AB (ref 0–5)
Eosinophils Absolute: 0.3 10*3/uL (ref 0.0–0.7)
HCT: 44.8 % (ref 39.0–52.0)
Hemoglobin: 15.3 g/dL (ref 13.0–17.0)
LYMPHS ABS: 1.5 10*3/uL (ref 0.7–4.0)
LYMPHS PCT: 25 % (ref 12–46)
MCH: 32.1 pg (ref 26.0–34.0)
MCHC: 34.2 g/dL (ref 30.0–36.0)
MCV: 94.1 fL (ref 78.0–100.0)
MONO ABS: 1 10*3/uL (ref 0.1–1.0)
Monocytes Relative: 17 % — ABNORMAL HIGH (ref 3–12)
Neutro Abs: 3 10*3/uL (ref 1.7–7.7)
Neutrophils Relative %: 51 % (ref 43–77)
Platelets: 221 10*3/uL (ref 150–400)
RBC: 4.76 MIL/uL (ref 4.22–5.81)
RDW: 13.1 % (ref 11.5–15.5)
WBC: 5.8 10*3/uL (ref 4.0–10.5)

## 2014-06-17 LAB — COMPREHENSIVE METABOLIC PANEL
ALT: 29 U/L (ref 17–63)
AST: 35 U/L (ref 15–41)
Albumin: 3.6 g/dL (ref 3.5–5.0)
Alkaline Phosphatase: 57 U/L (ref 38–126)
Anion gap: 8 (ref 5–15)
BUN: 11 mg/dL (ref 6–20)
CO2: 22 mmol/L (ref 22–32)
Calcium: 9.1 mg/dL (ref 8.9–10.3)
Chloride: 108 mmol/L (ref 101–111)
Creatinine, Ser: 0.82 mg/dL (ref 0.61–1.24)
GFR calc non Af Amer: >60 mL/min (ref >60)
Glucose, Bld: 97 mg/dL (ref 70–99)
Potassium: 4.5 mmol/L (ref 3.5–5.1)
Sodium: 138 mmol/L (ref 135–145)
TOTAL PROTEIN: 6.1 g/dL — AB (ref 6.5–8.1)
Total Bilirubin: 0.7 mg/dL (ref 0.3–1.2)

## 2014-06-17 LAB — PROTIME-INR
INR: 0.93 (ref 0.00–1.49)
PROTHROMBIN TIME: 12.5 s (ref 11.6–15.2)

## 2014-06-17 LAB — APTT: aPTT: 29 seconds (ref 24–37)

## 2014-06-17 LAB — SURGICAL PCR SCREEN
MRSA, PCR: NEGATIVE
Staphylococcus aureus: NEGATIVE

## 2014-06-17 SURGERY — ARTHROPLASTY, KNEE, TOTAL
Anesthesia: Monitor Anesthesia Care | Site: Knee | Laterality: Left

## 2014-06-17 MED ORDER — CITALOPRAM HYDROBROMIDE 10 MG PO TABS
10.0000 mg | ORAL_TABLET | Freq: Every day | ORAL | Status: DC
  Administered 2014-06-18 – 2014-06-19 (×2): 10 mg via ORAL
  Filled 2014-06-17 (×2): qty 1

## 2014-06-17 MED ORDER — SIMVASTATIN 40 MG PO TABS
40.0000 mg | ORAL_TABLET | Freq: Every evening | ORAL | Status: DC
  Administered 2014-06-17 – 2014-06-18 (×2): 40 mg via ORAL
  Filled 2014-06-17 (×2): qty 1

## 2014-06-17 MED ORDER — OXYCODONE HCL 5 MG/5ML PO SOLN: 5.0000 mg | Freq: Once | ORAL | Status: DC | PRN

## 2014-06-17 MED ORDER — MENTHOL 3 MG MT LOZG: 1.0000 | LOZENGE | OROMUCOSAL | Status: DC | PRN

## 2014-06-17 MED ORDER — RIVAROXABAN 10 MG PO TABS
10.0000 mg | ORAL_TABLET | Freq: Every day | ORAL | Status: DC
Start: 2014-06-18 — End: 2014-06-19
  Administered 2014-06-18 – 2014-06-19 (×2): 10 mg via ORAL
  Filled 2014-06-17 (×2): qty 1

## 2014-06-17 MED ORDER — ACETAMINOPHEN 10 MG/ML IV SOLN: 1000.0000 mg | Freq: Once | INTRAVENOUS | Status: DC

## 2014-06-17 MED ORDER — METOCLOPRAMIDE HCL 5 MG/ML IJ SOLN: 5.0000 mg | Freq: Three times a day (TID) | INTRAMUSCULAR | Status: DC | PRN

## 2014-06-17 MED ORDER — METHOCARBAMOL 500 MG PO TABS
500.0000 mg | ORAL_TABLET | Freq: Four times a day (QID) | ORAL | Status: DC | PRN
  Administered 2014-06-18 – 2014-06-19 (×4): 500 mg via ORAL
  Filled 2014-06-17 (×4): qty 1

## 2014-06-17 MED ORDER — ACETAMINOPHEN 160 MG/5ML PO SOLN
325.0000 mg | ORAL | Status: DC | PRN
  Filled 2014-06-17: qty 20.3

## 2014-06-17 MED ORDER — ACETAMINOPHEN 325 MG PO TABS: 325.0000 mg | ORAL_TABLET | ORAL | Status: DC | PRN

## 2014-06-17 MED ORDER — CEFAZOLIN SODIUM-DEXTROSE 2-3 GM-% IV SOLR
2.0000 g | INTRAVENOUS | Status: AC
  Administered 2014-06-17: 2 g via INTRAVENOUS

## 2014-06-17 MED ORDER — SODIUM CHLORIDE 0.9 % IR SOLN
Status: DC | PRN
  Administered 2014-06-17: 3000 mL

## 2014-06-17 MED ORDER — DIPHENHYDRAMINE HCL 12.5 MG/5ML PO ELIX: 12.5000 mg | ORAL_SOLUTION | ORAL | Status: DC | PRN

## 2014-06-17 MED ORDER — ZOLPIDEM TARTRATE 5 MG PO TABS
5.0000 mg | ORAL_TABLET | Freq: Every evening | ORAL | Status: DC | PRN
  Administered 2014-06-17 – 2014-06-18 (×2): 5 mg via ORAL
  Filled 2014-06-17 (×2): qty 1

## 2014-06-17 MED ORDER — METOCLOPRAMIDE HCL 5 MG PO TABS: 5.0000 mg | ORAL_TABLET | Freq: Three times a day (TID) | ORAL | Status: DC | PRN

## 2014-06-17 MED ORDER — MIDAZOLAM HCL 2 MG/2ML IJ SOLN
INTRAMUSCULAR | Status: AC
  Filled 2014-06-17: qty 2

## 2014-06-17 MED ORDER — FENTANYL CITRATE (PF) 100 MCG/2ML IJ SOLN
INTRAMUSCULAR | Status: AC
  Filled 2014-06-17: qty 2

## 2014-06-17 MED ORDER — SODIUM CHLORIDE 0.9 % IV SOLN
INTRAVENOUS | Status: DC
  Administered 2014-06-17: 22:00:00 via INTRAVENOUS

## 2014-06-17 MED ORDER — HYDROMORPHONE HCL 1 MG/ML IJ SOLN: 0.5000 mg | INTRAMUSCULAR | Status: DC | PRN

## 2014-06-17 MED ORDER — OXYCODONE HCL 5 MG PO TABS: 5.0000 mg | ORAL_TABLET | Freq: Once | ORAL | Status: DC | PRN

## 2014-06-17 MED ORDER — SODIUM CHLORIDE 0.9 % IR SOLN
Status: DC | PRN
  Administered 2014-06-17: 1000 mL

## 2014-06-17 MED ORDER — FENTANYL CITRATE (PF) 100 MCG/2ML IJ SOLN
INTRAMUSCULAR | Status: DC | PRN
  Administered 2014-06-17 (×2): 50 ug via INTRAVENOUS

## 2014-06-17 MED ORDER — FENTANYL CITRATE (PF) 250 MCG/5ML IJ SOLN
INTRAMUSCULAR | Status: AC
  Filled 2014-06-17: qty 5

## 2014-06-17 MED ORDER — PROPOFOL INFUSION 10 MG/ML OPTIME
INTRAVENOUS | Status: DC | PRN
  Administered 2014-06-17: 40 ug/kg/min via INTRAVENOUS

## 2014-06-17 MED ORDER — SODIUM CHLORIDE 0.9 % IV SOLN: INTRAVENOUS | Status: DC

## 2014-06-17 MED ORDER — METHOCARBAMOL 1000 MG/10ML IJ SOLN: 500.0000 mg | Freq: Four times a day (QID) | INTRAVENOUS | Status: DC | PRN

## 2014-06-17 MED ORDER — ALPRAZOLAM 0.25 MG PO TABS
0.2500 mg | ORAL_TABLET | Freq: Every day | ORAL | Status: DC | PRN
  Administered 2014-06-19: 0.25 mg via ORAL
  Filled 2014-06-17: qty 1

## 2014-06-17 MED ORDER — POLYETHYLENE GLYCOL 3350 17 G PO PACK
17.0000 g | PACK | Freq: Every day | ORAL | Status: DC | PRN
  Administered 2014-06-18: 17 g via ORAL
  Filled 2014-06-17: qty 1

## 2014-06-17 MED ORDER — CEFAZOLIN SODIUM-DEXTROSE 2-3 GM-% IV SOLR
2.0000 g | Freq: Four times a day (QID) | INTRAVENOUS | Status: AC
  Administered 2014-06-18: 2 g via INTRAVENOUS
  Filled 2014-06-17 (×2): qty 50

## 2014-06-17 MED ORDER — ALUM & MAG HYDROXIDE-SIMETH 200-200-20 MG/5ML PO SUSP
30.0000 mL | ORAL | Status: DC | PRN
  Administered 2014-06-18: 30 mL via ORAL
  Filled 2014-06-17: qty 30

## 2014-06-17 MED ORDER — DIPHENHYDRAMINE HCL 50 MG/ML IJ SOLN
INTRAMUSCULAR | Status: AC
  Filled 2014-06-17: qty 1

## 2014-06-17 MED ORDER — LACTATED RINGERS IV SOLN
INTRAVENOUS | Status: DC | PRN
  Administered 2014-06-17 (×2): via INTRAVENOUS

## 2014-06-17 MED ORDER — HYDROMORPHONE HCL 1 MG/ML IJ SOLN: 0.2500 mg | INTRAMUSCULAR | Status: DC | PRN

## 2014-06-17 MED ORDER — OXYCODONE HCL 5 MG PO TABS
5.0000 mg | ORAL_TABLET | ORAL | Status: DC | PRN
  Administered 2014-06-17 – 2014-06-19 (×11): 10 mg via ORAL
  Filled 2014-06-17 (×11): qty 2

## 2014-06-17 MED ORDER — ONDANSETRON HCL 4 MG/2ML IJ SOLN: 4.0000 mg | Freq: Four times a day (QID) | INTRAMUSCULAR | Status: DC | PRN

## 2014-06-17 MED ORDER — OCUVITE PO TABS
1.0000 | ORAL_TABLET | Freq: Every day | ORAL | Status: DC
  Administered 2014-06-17 – 2014-06-19 (×3): 1 via ORAL
  Filled 2014-06-17 (×3): qty 1

## 2014-06-17 MED ORDER — DOCUSATE SODIUM 100 MG PO CAPS
100.0000 mg | ORAL_CAPSULE | Freq: Two times a day (BID) | ORAL | Status: DC
  Administered 2014-06-17 – 2014-06-19 (×4): 100 mg via ORAL
  Filled 2014-06-17 (×4): qty 1

## 2014-06-17 MED ORDER — ONDANSETRON HCL 4 MG PO TABS: 4.0000 mg | ORAL_TABLET | Freq: Four times a day (QID) | ORAL | Status: DC | PRN

## 2014-06-17 MED ORDER — PANTOPRAZOLE SODIUM 40 MG PO TBEC
40.0000 mg | DELAYED_RELEASE_TABLET | Freq: Every day | ORAL | Status: DC
  Administered 2014-06-18 – 2014-06-19 (×2): 40 mg via ORAL
  Filled 2014-06-17 (×2): qty 1

## 2014-06-17 MED ORDER — MUPIROCIN 2 % EX OINT
TOPICAL_OINTMENT | CUTANEOUS | Status: AC
  Administered 2014-06-17: 1
  Filled 2014-06-17: qty 22

## 2014-06-17 MED ORDER — BUPIVACAINE-EPINEPHRINE (PF) 0.25% -1:200000 IJ SOLN
INTRAMUSCULAR | Status: AC
  Filled 2014-06-17: qty 30

## 2014-06-17 MED ORDER — RISAQUAD PO CAPS
1.0000 | ORAL_CAPSULE | Freq: Two times a day (BID) | ORAL | Status: DC
  Administered 2014-06-17 – 2014-06-19 (×4): 1 via ORAL
  Filled 2014-06-17 (×5): qty 1

## 2014-06-17 MED ORDER — CHLORHEXIDINE GLUCONATE 4 % EX LIQD
60.0000 mL | Freq: Once | CUTANEOUS | Status: DC
  Filled 2014-06-17: qty 60

## 2014-06-17 MED ORDER — VALACYCLOVIR HCL 500 MG PO TABS
500.0000 mg | ORAL_TABLET | Freq: Every day | ORAL | Status: DC
  Administered 2014-06-17 – 2014-06-19 (×3): 500 mg via ORAL
  Filled 2014-06-17 (×3): qty 1

## 2014-06-17 MED ORDER — CHLORHEXIDINE GLUCONATE 4 % EX LIQD
60.0000 mL | Freq: Once | CUTANEOUS | Status: DC
Start: 2014-06-17 — End: 2014-06-17
  Filled 2014-06-17: qty 60

## 2014-06-17 MED ORDER — ACETAMINOPHEN 10 MG/ML IV SOLN
1000.0000 mg | Freq: Four times a day (QID) | INTRAVENOUS | Status: AC
  Administered 2014-06-17 – 2014-06-18 (×3): 1000 mg via INTRAVENOUS
  Filled 2014-06-17 (×3): qty 100

## 2014-06-17 MED ORDER — ROPIVACAINE HCL 5 MG/ML IJ SOLN
INTRAMUSCULAR | Status: DC | PRN
  Administered 2014-06-17: 25 mL via PERINEURAL

## 2014-06-17 MED ORDER — BUPIVACAINE-EPINEPHRINE 0.25% -1:200000 IJ SOLN
INTRAMUSCULAR | Status: DC | PRN
  Administered 2014-06-17: 30 mL

## 2014-06-17 MED ORDER — KETOROLAC TROMETHAMINE 15 MG/ML IJ SOLN
7.5000 mg | Freq: Four times a day (QID) | INTRAMUSCULAR | Status: AC
  Administered 2014-06-17 – 2014-06-18 (×3): 7.5 mg via INTRAVENOUS
  Filled 2014-06-17 (×3): qty 1

## 2014-06-17 MED ORDER — PHENOL 1.4 % MT LIQD
1.0000 | OROMUCOSAL | Status: DC | PRN
Start: 2014-06-17 — End: 2014-06-19

## 2014-06-17 MED ORDER — BISACODYL 10 MG RE SUPP
10.0000 mg | Freq: Every day | RECTAL | Status: DC | PRN
  Administered 2014-06-18: 10 mg via RECTAL
  Filled 2014-06-17: qty 1

## 2014-06-17 MED ORDER — MAGNESIUM CITRATE PO SOLN: 1.0000 | Freq: Once | ORAL | Status: AC | PRN

## 2014-06-17 SURGICAL SUPPLY — 66 items
BANDAGE ESMARK 6X9 LF (GAUZE/BANDAGES/DRESSINGS) ×1 IMPLANT
BLADE SAGITTAL 25.0X1.19X90 (BLADE) ×2 IMPLANT
BNDG CMPR 9X6 STRL LF SNTH (GAUZE/BANDAGES/DRESSINGS) ×1
BNDG ESMARK 6X9 LF (GAUZE/BANDAGES/DRESSINGS) ×2
BOWL SMART MIX CTS (DISPOSABLE) ×2 IMPLANT
CAP KNEE TOTAL 3 SIGMA ×1 IMPLANT
CEMENT HV SMART SET (Cement) ×4 IMPLANT
COVER SURGICAL LIGHT HANDLE (MISCELLANEOUS) ×2 IMPLANT
CUFF TOURNIQUET SINGLE 34IN LL (TOURNIQUET CUFF) ×1 IMPLANT
CUFF TOURNIQUET SINGLE 44IN (TOURNIQUET CUFF) IMPLANT
DRAPE EXTREMITY T 121X128X90 (DRAPE) ×2 IMPLANT
DRAPE IMP U-DRAPE 54X76 (DRAPES) ×2 IMPLANT
DRAPE PROXIMA HALF (DRAPES) ×2 IMPLANT
DRSG ADAPTIC 3X8 NADH LF (GAUZE/BANDAGES/DRESSINGS) ×2 IMPLANT
DRSG PAD ABDOMINAL 8X10 ST (GAUZE/BANDAGES/DRESSINGS) ×3 IMPLANT
DURAPREP 26ML APPLICATOR (WOUND CARE) ×5 IMPLANT
ELECT CAUTERY BLADE 6.4 (BLADE) ×2 IMPLANT
ELECT REM PT RETURN 9FT ADLT (ELECTROSURGICAL) ×2
ELECTRODE REM PT RTRN 9FT ADLT (ELECTROSURGICAL) ×1 IMPLANT
EVACUATOR 1/8 PVC DRAIN (DRAIN) ×1 IMPLANT
FACESHIELD WRAPAROUND (MASK) ×6 IMPLANT
FACESHIELD WRAPAROUND OR TEAM (MASK) ×2 IMPLANT
GAUZE SPONGE 4X4 12PLY STRL (GAUZE/BANDAGES/DRESSINGS) ×2 IMPLANT
GLOVE BIO SURGEON STRL SZ7 (GLOVE) ×1 IMPLANT
GLOVE BIOGEL PI IND STRL 7.0 (GLOVE) IMPLANT
GLOVE BIOGEL PI IND STRL 8 (GLOVE) ×1 IMPLANT
GLOVE BIOGEL PI IND STRL 8.5 (GLOVE) ×1 IMPLANT
GLOVE BIOGEL PI INDICATOR 7.0 (GLOVE) ×2
GLOVE BIOGEL PI INDICATOR 8 (GLOVE) ×1
GLOVE BIOGEL PI INDICATOR 8.5 (GLOVE) ×1
GLOVE ECLIPSE 6.5 STRL STRAW (GLOVE) ×1 IMPLANT
GLOVE ECLIPSE 8.0 STRL XLNG CF (GLOVE) ×5 IMPLANT
GLOVE SURG ORTHO 8.5 STRL (GLOVE) ×4 IMPLANT
GOWN STRL REUS W/ TWL LRG LVL3 (GOWN DISPOSABLE) ×2 IMPLANT
GOWN STRL REUS W/TWL 2XL LVL3 (GOWN DISPOSABLE) ×2 IMPLANT
GOWN STRL REUS W/TWL LRG LVL3 (GOWN DISPOSABLE) ×4
HANDPIECE INTERPULSE COAX TIP (DISPOSABLE) ×2
IV CATH 14GX2 1/4 (CATHETERS) ×1 IMPLANT
KIT BASIN OR (CUSTOM PROCEDURE TRAY) ×2 IMPLANT
KIT ROOM TURNOVER OR (KITS) ×2 IMPLANT
MANIFOLD NEPTUNE II (INSTRUMENTS) ×2 IMPLANT
NEEDLE 22X1 1/2 (OR ONLY) (NEEDLE) ×1 IMPLANT
NS IRRIG 1000ML POUR BTL (IV SOLUTION) ×2 IMPLANT
PACK TOTAL JOINT (CUSTOM PROCEDURE TRAY) ×2 IMPLANT
PACK UNIVERSAL I (CUSTOM PROCEDURE TRAY) ×2 IMPLANT
PAD ARMBOARD 7.5X6 YLW CONV (MISCELLANEOUS) ×4 IMPLANT
PAD CAST 4YDX4 CTTN HI CHSV (CAST SUPPLIES) ×1 IMPLANT
PADDING CAST COTTON 4X4 STRL (CAST SUPPLIES) ×2
PADDING CAST COTTON 6X4 STRL (CAST SUPPLIES) ×2 IMPLANT
SET HNDPC FAN SPRY TIP SCT (DISPOSABLE) ×1 IMPLANT
SPONGE GAUZE 4X4 12PLY STER LF (GAUZE/BANDAGES/DRESSINGS) ×1 IMPLANT
STAPLER VISISTAT 35W (STAPLE) ×2 IMPLANT
SUCTION FRAZIER TIP 10 FR DISP (SUCTIONS) ×2 IMPLANT
SUT BONE WAX W31G (SUTURE) ×2 IMPLANT
SUT ETHIBOND NAB CT1 #1 30IN (SUTURE) ×6 IMPLANT
SUT MNCRL AB 3-0 PS2 18 (SUTURE) ×2 IMPLANT
SUT VIC AB 0 CT1 27 (SUTURE) ×2
SUT VIC AB 0 CT1 27XBRD ANBCTR (SUTURE) ×1 IMPLANT
SUT VIC AB 1 CT1 27 (SUTURE) ×2
SUT VIC AB 1 CT1 27XBRD ANBCTR (SUTURE) ×1 IMPLANT
SYR 30ML LL (SYRINGE) ×1 IMPLANT
SYR CONTROL 10ML LL (SYRINGE) ×1 IMPLANT
TOWEL OR 17X24 6PK STRL BLUE (TOWEL DISPOSABLE) ×2 IMPLANT
TOWEL OR 17X26 10 PK STRL BLUE (TOWEL DISPOSABLE) ×2 IMPLANT
WATER STERILE IRR 1000ML POUR (IV SOLUTION) ×4 IMPLANT
WRAP KNEE MAXI GEL POST OP (GAUZE/BANDAGES/DRESSINGS) ×2 IMPLANT

## 2014-06-17 NOTE — Anesthesia Procedure Notes (Signed)
Anesthesia Regional Block:  Femoral nerve block  Pre-Anesthetic Checklist: ,, timeout performed, Correct Patient, Correct Site, Correct Laterality, Correct Procedure, Correct Position, site marked, Risks and benefits discussed,  Surgical consent,  Pre-op evaluation,  At surgeon's request and post-op pain management  Laterality: Lower and Left  Prep: chloraprep       Needles:  Injection technique: Single-shot  Needle Type: Echogenic Stimulator Needle          Additional Needles:  Procedures: ultrasound guided (picture in chart) and nerve stimulator Femoral nerve block  Nerve Stimulator or Paresthesia:  Response: quad, 0.5 mA,   Additional Responses:   Narrative:  Injection made incrementally with aspirations every 5 mL.  Performed by: Personally  Anesthesiologist: Elverta Dimiceli, CHRIS  Additional Notes: H+P and labs reviewed, risks and benefits discussed with patient, procedure tolerated well without complications

## 2014-06-17 NOTE — Op Note (Signed)
PATIENT ID:      Carl [name]  MRN:     [id number] DOB/AGE:    1945/06/20 / 69 y.o.       OPERATIVE REPORT    DATE OF PROCEDURE:  06/17/2014       PREOPERATIVE DIAGNOSIS: PRIMARY  END STAGE OSTEOARTHRITIS OF LEFT KNEE                                                       Estimated body mass index is 28.32 kg/(m^2) as calculated from the following:   Height as of this encounter: 5' 8.5" (1.74 m).   Weight as of this encounter: 85.73 kg (189 lb).     POSTOPERATIVE DIAGNOSIS:   END STAGE OSTEOARTHRITIS OF LEFT KNEE -SAME                                                                    Estimated body mass index is 28.32 kg/(m^2) as calculated from the following:   Height as of this encounter: 5' 8.5" (1.74 m).   Weight as of this encounter: 85.73 kg (189 lb).     PROCEDURE:  Procedure(s):LEFT TOTAL KNEE ARTHROPLASTY     SURGEON:  [name], MD    ASSISTANT:   Biagio Borg, PA-C   (Present and scrubbed throughout the case, critical for assistance with exposure, retraction, instrumentation, and closure.)          ANESTHESIA: regional and spinal     DRAINS: HEMOVAC IN LEFT KNEE-CLAMPED :      TOURNIQUET TIME:  Total Tourniquet Time Documented: Thigh (Left) - 94 minutes Total: Thigh (Left) - 94 minutes     COMPLICATIONS:  None   CONDITION:  stable  PROCEDURE IN DETAIL: 160109   Reeya Bound W 06/17/2014, 12:46 PM

## 2014-06-17 NOTE — Anesthesia Preprocedure Evaluation (Signed)
Anesthesia Evaluation  Patient identified by MRN, date of birth, ID band Patient awake    Reviewed: Allergy & Precautions, NPO status , Patient's Chart, lab work & pertinent test results  History of Anesthesia Complications Negative for: history of anesthetic complications  Airway Mallampati: II  TM Distance: >3 FB     Dental  (+) Teeth Intact   Pulmonary neg pulmonary ROS,  breath sounds clear to auscultation        Cardiovascular negative cardio ROS  Rhythm:Regular     Neuro/Psych PSYCHIATRIC DISORDERS Anxiety Depression negative neurological ROS     GI/Hepatic Neg liver ROS, GERD-  Medicated and Controlled,  Endo/Other  negative endocrine ROS  Renal/GU negative Renal ROS     Musculoskeletal  (+) Arthritis -,   Abdominal   Peds  Hematology negative hematology ROS (+)   Anesthesia Other Findings   Reproductive/Obstetrics                             Anesthesia Physical Anesthesia Plan  ASA: II  Anesthesia Plan: MAC, Regional and Spinal   Post-op Pain Management:    Induction: Intravenous  Airway Management Planned: Nasal Cannula  Additional Equipment: None  Intra-op Plan:   Post-operative Plan:   Informed Consent: I have reviewed the patients History and Physical, chart, labs and discussed the procedure including the risks, benefits and alternatives for the proposed anesthesia with the patient or authorized representative who has indicated his/her understanding and acceptance.   Dental advisory given  Plan Discussed with: CRNA and Surgeon  Anesthesia Plan Comments:         Anesthesia Quick Evaluation

## 2014-06-17 NOTE — Anesthesia Postprocedure Evaluation (Signed)
  Anesthesia Post-op Note  Patient: Carl [name]  Procedure(s) Performed: Procedure(s): TOTAL KNEE ARTHROPLASTY (Left)  Patient Location: PACU  Anesthesia Type:Regional and Spinal  Level of Consciousness: awake  Airway and Oxygen Therapy: Patient Spontanous Breathing  Post-op Pain: none  Post-op Assessment: Post-op Vital signs reviewed, Patient's Cardiovascular Status Stable, Respiratory Function Stable, Patent Airway, No signs of Nausea or vomiting and Pain level controlled  Post-op Vital Signs: Reviewed and stable  Last Vitals:  Filed Vitals:   06/17/14 1515  BP: 150/88  Pulse: 62  Temp:   Resp:     Complications: No apparent anesthesia complications

## 2014-06-17 NOTE — H&P (Signed)
  The recent History & Physical has been reviewed. I have personally examined the patient today. There is no interval change to the documented History & Physical. The patient would like to proceed with the procedure.  [name] W 06/17/2014,  9:39 AM

## 2014-06-17 NOTE — Transfer of Care (Signed)
Immediate Anesthesia Transfer of Care Note  Patient: Carl [name]  Procedure(s) Performed: Procedure(s): TOTAL KNEE ARTHROPLASTY (Left)  Patient Location: PACU  Anesthesia Type:MAC and Spinal  Level of Consciousness: awake, alert  and oriented  Airway & Oxygen Therapy: Patient Spontanous Breathing and Patient connected to nasal cannula oxygen  Post-op Assessment: Report given to RN and Post -op Vital signs reviewed and stable  Post vital signs: Reviewed and stable  Last Vitals:  Filed Vitals:   06/17/14 0829  BP: 133/59  Pulse: 57  Temp: 36.4 C  Resp: 20    Complications: No apparent anesthesia complications

## 2014-06-17 NOTE — Progress Notes (Signed)
Orthopedic Tech Progress Note Patient Details:  Carl [name] October 29, 1945 [id number] Start time 5 0-90 CPM Left Knee CPM Left Knee: On Left Knee Flexion (Degrees): 90 Left Knee Extension (Degrees): 0  Ortho Devices Ortho Device/Splint Location: appleid overhead frame to bed Ortho Device/Splint Interventions: Ordered, Application   Braulio Bosch 06/17/2014, 3:43 PM

## 2014-06-18 ENCOUNTER — Encounter (HOSPITAL_COMMUNITY): Payer: Self-pay | Admitting: Orthopaedic Surgery

## 2014-06-18 LAB — BASIC METABOLIC PANEL
Anion gap: 7 (ref 5–15)
BUN: 13 mg/dL (ref 6–20)
CO2: 27 mmol/L (ref 22–32)
CREATININE: 0.97 mg/dL (ref 0.61–1.24)
Calcium: 8.4 mg/dL — ABNORMAL LOW (ref 8.9–10.3)
Chloride: 102 mmol/L (ref 101–111)
GFR calc Af Amer: >60 mL/min (ref >60)
GFR calc non Af Amer: >60 mL/min (ref >60)
Glucose, Bld: 120 mg/dL — ABNORMAL HIGH (ref 70–99)
Potassium: 4.6 mmol/L (ref 3.5–5.1)
Sodium: 136 mmol/L (ref 135–145)

## 2014-06-18 LAB — URINE CULTURE
CULTURE: NO GROWTH
Colony Count: NO GROWTH

## 2014-06-18 LAB — CBC
HCT: 36.3 % — ABNORMAL LOW (ref 39.0–52.0)
Hemoglobin: 12.3 g/dL — ABNORMAL LOW (ref 13.0–17.0)
MCH: 32.4 pg (ref 26.0–34.0)
MCHC: 33.9 g/dL (ref 30.0–36.0)
MCV: 95.5 fL (ref 78.0–100.0)
Platelets: 203 10*3/uL (ref 150–400)
RBC: 3.8 MIL/uL — ABNORMAL LOW (ref 4.22–5.81)
RDW: 13.3 % (ref 11.5–15.5)
WBC: 7.8 10*3/uL (ref 4.0–10.5)

## 2014-06-18 NOTE — Progress Notes (Signed)
Orthopedic Tech Progress Note Patient Details:  Carl [name] [birth year] [id number] On cpm at 6:50 pm 0-90 Patient ID: [name], male   DOB: [birth year], 70 y.o.   MRN: [id number]   Braulio Bosch 06/18/2014, 6:51 PM

## 2014-06-18 NOTE — Evaluation (Signed)
Physical Therapy Evaluation Patient Details Name: Carl [name] MRN: [id number] DOB: [birth year] Today's Date: 06/18/2014   History of Present Illness  Patient is a 69 y/o male s/p L TKA. PMH of anxiety, HLD, depression, enlarged prostate.  Clinical Impression  Patient presents with post surgical deficits LLE s/p L TKA. Pt highly motivated to return to functional independence. Reviewed HEP and precautions. Will attempt stair negotiation this PM. Pt will have 24/7 S from sister for 3 weeks at d/c. Would benefit from skilled PT to improve gait, balance and safe mobility so pt can maximize independence and return to PLOF.    Follow Up Recommendations Home health PT;Supervision/Assistance - 24 hour    Equipment Recommendations  None recommended by PT    Recommendations for Other Services       Precautions / Restrictions Precautions Precautions: Knee Precaution Booklet Issued: Yes (comment) Precaution Comments: Reviewed no pillow under knee and HEP. Restrictions Weight Bearing Restrictions: Yes LLE Weight Bearing: Partial weight bearing LLE Partial Weight Bearing Percentage or Pounds: 50%      Mobility  Bed Mobility Overal bed mobility: Modified Independent             General bed mobility comments: Use of trapeze bar to get to EOB. no physical assist.   Transfers Overall transfer level: Needs assistance Equipment used: Rolling walker (2 wheeled) Transfers: Sit to/from Stand Sit to Stand: Min guard         General transfer comment: Min guard for safety. Unsteady initially during transition.  Ambulation/Gait Ambulation/Gait assistance: Min guard Ambulation Distance (Feet): 50 Feet Assistive device: Rolling walker (2 wheeled) Gait Pattern/deviations: Step-to pattern;Decreased stride length;Decreased stance time - left;Decreased step length - right;Trunk flexed   Gait velocity interpretation: Below normal speed for age/gender General Gait Details: Cues to roll RW  instead of pick it up and be compliant with PWB during gait.  Stairs            Wheelchair Mobility    Modified Rankin (Stroke Patients Only)       Balance Overall balance assessment: Needs assistance Sitting-balance support: Feet supported;No upper extremity supported Sitting balance-Leahy Scale: Good     Standing balance support: During functional activity Standing balance-Leahy Scale: Fair                               Pertinent Vitals/Pain Pain Assessment: No/denies pain    Home Living Family/patient expects to be discharged to:: Private residence Living Arrangements: Alone;Other relatives (Sister will be staying with her for 3 weeks.) Available Help at Discharge: Family;Available 24 hours/day Type of Home: House Home Access: Stairs to enter Entrance Stairs-Rails: None Entrance Stairs-Number of Steps: 2 Home Layout: Two level Home Equipment: Walker - 2 wheels;Bedside commode      Prior Function Level of Independence: Independent               Hand Dominance        Extremity/Trunk Assessment   Upper Extremity Assessment: Defer to OT evaluation           Lower Extremity Assessment: LLE deficits/detail   LLE Deficits / Details: Able to perform LAQ with extension lag.     Communication   Communication: No difficulties  Cognition Arousal/Alertness: Awake/alert Behavior During Therapy: WFL for tasks assessed/performed;Impulsive Overall Cognitive Status: Within Functional Limits for tasks assessed  General Comments General comments (skin integrity, edema, etc.): Pt's sister and family in room during session.    Exercises Total Joint Exercises Ankle Circles/Pumps: Both;15 reps;Seated Quad Sets: Left;10 reps;Seated Heel Slides: Left;5 reps;Seated Long Arc Quad: Left;5 reps;Seated Goniometric ROM: 10-94 degrees      Assessment/Plan    PT Assessment Patient needs continued PT services  PT  Diagnosis Difficulty walking   PT Problem List Decreased strength;Pain;Decreased range of motion;Decreased balance;Decreased mobility;Decreased knowledge of use of DME  PT Treatment Interventions Balance training;Gait training;DME instruction;Stair training;Functional mobility training;Patient/family education;Therapeutic activities;Therapeutic exercise   PT Goals (Current goals can be found in the Care Plan section) Acute Rehab PT Goals Patient Stated Goal: to go to a wedding on the 21st of May. PT Goal Formulation: With patient Time For Goal Achievement: 07/02/14 Potential to Achieve Goals: Good    Frequency BID   Barriers to discharge        Co-evaluation               End of Session Equipment Utilized During Treatment: Gait belt Activity Tolerance: Patient tolerated treatment well Patient left: in chair;with call bell/phone within reach;with family/visitor present Nurse Communication: Mobility status         Time: 1188-6773 PT Time Calculation (min) (ACUTE ONLY): 26 min   Charges:   PT Evaluation $Initial PT Evaluation Tier I: 1 Procedure PT Treatments $Therapeutic Activity: 8-22 mins   PT G Codes:        Gaston Dase A Harshil Cavallaro 06/18/2014, 10:54 AM  Wray Kearns, PT, DPT [phone]

## 2014-06-18 NOTE — Care Management Note (Signed)
Case Management Note  Patient Details  Name: Carl [name] MRN: [id number] Date of Birth: Nov 19, 1945  Subjective/Objective:      Patient admitted with Left knee osteoarthritis. Patient underwent a left total knee arthroplasty.            Action/Plan:  Patient was preoperatively setup with [facility], no changes.  Expected Discharge Date: 06/19/14                  Expected Discharge Plan: Home with [facility]    In-House Referral:     Discharge planning Services  CM Consult  Post Acute Care Choice:  Home Health Choice offered to:  Patient  DME Arranged:  3-N-1, CPM, Walker rolling DME Agency:  [employer]  HH Arranged:  PT HH Agency:  [facility]  Status of Service:  Completed, signed off  Medicare Important Message Given:  N/A - LOS <3 / Initial given by admissions Date Medicare IM Given:    Medicare IM give by:    Date Additional Medicare IM Given:    Additional Medicare Important Message give by:     If discussed at [place] of Stay Meetings, dates discussed:    Additional Comments:  Ninfa Meeker, RN 06/18/2014, 11:59 AM

## 2014-06-18 NOTE — Op Note (Signed)
NAMEGERELL, [name] NO.:  [id number]  MEDICAL RECORD NO.:  [id number]  LOCATION:  5N09C                        FACILITY:  [facility]  PHYSICIAN:  Vonna Kotyk. Kambrea Carrasco, M.D.DATE OF BIRTH:  22-Oct-1945  DATE OF PROCEDURE:  06/17/2014 DATE OF DISCHARGE:                              OPERATIVE REPORT   PREOPERATIVE DIAGNOSIS:  Primary end-stage osteoarthritis, left knee.  POSTOPERATIVE DIAGNOSIS:  Primary end-stage osteoarthritis, left knee.  PROCEDURE:  Left total knee replacement.  SURGEON:  Vonna Kotyk. Durward Fortes, M.D.  ASSISTANT:  Aaron Edelman D. Petrarca, P.A.-C.  ANESTHESIA:  Spinal with supplementary femoral nerve block.  COMPLICATIONS:  None.  COMPONENTS:  DePuy LCS standard plus femoral component  #5 rotating keeled tibial tray 10 mm polyethylene bridging bearing, and metal-back 3- peg rotating patella, each was secured with polymethyl methacrylate.  DESCRIPTION OF PROCEDURE:  Carl [name] was met with a sister in the holding area, identified the left knee as appropriate operative site and marked it accordingly.  Anesthesia performed a femoral nerve block.  The patient was then transported to room #10 and placed under a spinal anesthetic per Anesthesia without difficulty.  The left lower extremity was then placed in a thigh tourniquet.  The leg was prepped with chlorhexidine scrub and then DuraPrep x2 from the tourniquet to the tips of the toes.  Sterile draping was performed. Time-out was called.  The extremity was then elevated and Esmarch exsanguinated with a proximal tourniquet at 350 mmHg.  A midline longitudinal incision was made centered about the patella extending from the superior pouch to the tibial tubercle.  Via sharp dissection, incision was carried down to subcutaneous tissue.  First layer of capsule was incised in the midline.  A medial parapatellar incision was made with the Bovie.  The joint was entered.  There was about 15 mL clear yellow joint  effusion.  Patella was everted 180 degrees laterally.  Knee flexed to 90 degrees.  There were large osteophytes along the medial and lateral femoral condyle.  Complete absence of articular cartilage along the medial compartment and large osteophytes along the medial tibial plateau. Osteophytes were removed for sizing purposes.  Synovectomy was performed.  The patient did have a fixed flexion contracture preoperatively.  I could correct the varus position to neutral.  There were several large meniscal cysts along the medial proximal tibia. These were decompressed and thickened ganglionic fluid was removed.  I sized a standard plus femoral component.  First, bony cut was then made transversely in the proximal tibia using the external tibial guide with an 8-degree angle of declination.  After each bony cut on the tibia and the femur, I used the external alignment guide to be sure I had appropriate alignment.  Subsequent cuts were then made on the femur using the standard plus femoral guide.  Flexion and extension gaps were perfectly symmetrical at 10 mm.  I used a 4-degree distal femoral valgus cut.  Laminar spreaders were inserted along the medial-lateral compartment, so that I could remove medial-lateral menisci as well as ACL and PCL. There were large osteophytes along the posterior femoral condyle both medially and laterally.  These were removed with a  3/4-inch curved osteotome.  I then sized a 10-mm flexion gap and subsequently the same with extension with excellent alignment.  There were several small osteocartilaginous loose bodies that measured less than a cm.  There was a Comptroller cyst identified.  I just debrided the edges.  A 4-degree distal femoral valgus cut was made followed by the final tapering cuts on the femur and the center holes.  Retractor then placed around the tibia was advanced anteriorly and measured a #5 tibial tray. This was pinned in place.  The external  alignment was checked.  Center hole was made followed by the keeled cut with the tibial tray still in place.  I applied a 10-mm polyethylene bridging bearing followed by the standard plus femoral component and then reduced the entire construct. With excellent motion, there was no opening with varus or valgus stress. Full extension and flexion without any malrotation of the tibial tray.  I removed 10 mm of patellar thickness, leaving 14 mm of patellar thickness.  I obtained the appropriate angle on the patella and then the drill holes and applied the trial of patella.  The lateral compartment was quite tight.  So, I did a lateral release.  At that point, the patella would not cell blocks.  The trial components removed.  The joint was copiously irrigated with saline solution.  The final components were then initially impacted with tibial tray with the polymethyl methacrylate followed by the 10-mm polyethylene bridging bearing the standard plus femoral component.  Knee was placed in extension.  I thought we had excellent alignment of the components with nice fit.  Extraneous methacrylate was removed from the periphery of the components.  With the knee still in extension, the patella was applied with a bone clamp and methyl methacrylate.  At approximately 16 minutes of methacrylate had matured, during which time, we injected the deep capsule with 0.25% Marcaine with epinephrine.  At 94 minutes, the tourniquet was released.  We had immediate capillary refill to the joint.  Gross bleeders were Bovie coagulated.  We had a nice dry field at closure.  Medium-sized Hemovac was placed at the lateral compartment.  The deep capsule was then closed with a running #1 Ethibond. Superficial capsule closed with running 0 Vicryl, subcu with 2-0 Vicryl and 3-0 Monocryl.  Sterile bulky dressing was applied followed by the patient's support stocking.  The patient had a bad deformity, I thought the  procedure went very well technically.  The patient was then re-transported to the postanesthesia recovery room in satisfactory condition.     Vonna Kotyk. Durward Fortes, M.D.     PWW/MEDQ  D:  06/17/2014  T:  06/18/2014  Job:  [id number]

## 2014-06-18 NOTE — Progress Notes (Signed)
Physical Therapy Treatment Patient Details Name: Carl [name] MRN: [id number] DOB: [birth year] Today's Date: 06/18/2014    History of Present Illness Patient is a 69 y/o male s/p L TKA. PMH of anxiety, HLD, depression, enlarged prostate.    PT Comments    Patient progressing well with mobility. Tolerated stair negotiation with cues for technique and Min guard assist for safety. Pt to have a rail put up on outside steps to enter home. Continues to require cues to decrease gait speed and adhere to WB precautions during gait training. Highly motivated. Will continue to follow and progress. Will need to navigate 1 flight of steps tomorrow prior to d/c.   Follow Up Recommendations  Home health PT;Supervision/Assistance - 24 hour     Equipment Recommendations  None recommended by PT    Recommendations for Other Services       Precautions / Restrictions Precautions Precautions: Knee Precaution Booklet Issued: Yes (comment) Precaution Comments: Reviewed no pillow under knee and HEP. Restrictions Weight Bearing Restrictions: Yes LLE Weight Bearing: Partial weight bearing LLE Partial Weight Bearing Percentage or Pounds: 50%    Mobility  Bed Mobility Overal bed mobility: Modified Independent;Needs Assistance Bed Mobility: Supine to Sit;Sit to Supine     Supine to sit: Modified independent (Device/Increase time) Sit to supine: Modified independent (Device/Increase time)   General bed mobility comments: HOB flat, no rails or use of trapeze bar to simulate home.   Transfers Overall transfer level: Needs assistance Equipment used: Rolling walker (2 wheeled) Transfers: Sit to/from Stand Sit to Stand: Supervision         General transfer comment: Supervision for safety. Pt mildly impulsive. Cues to adhere to WB precautions.  Ambulation/Gait Ambulation/Gait assistance: Min guard Ambulation Distance (Feet): 150 Feet Assistive device: Rolling walker (2 wheeled) Gait  Pattern/deviations: Step-to pattern;Decreased stride length;Decreased stance time - left;Decreased step length - right;Step-through pattern;Trunk flexed;Antalgic   Gait velocity interpretation: Below normal speed for age/gender General Gait Details: Cues for PWB LLE during gait training and to increase knee flexion during swing phase of gait.   Stairs Stairs: Yes Stairs assistance: Min guard Stair Management: One rail Left;Step to pattern;Forwards Number of Stairs: 2 General stair comments: Cues for technique and to adhere to WB precautions.  Wheelchair Mobility    Modified Rankin (Stroke Patients Only)       Balance Overall balance assessment: Needs assistance Sitting-balance support: Feet supported;No upper extremity supported Sitting balance-Leahy Scale: Good     Standing balance support: During functional activity Standing balance-Leahy Scale: Fair                      Cognition Arousal/Alertness: Awake/alert Behavior During Therapy: WFL for tasks assessed/performed Overall Cognitive Status: Within Functional Limits for tasks assessed                      Exercises Total Joint Exercises Ankle Circles/Pumps: Both;15 reps;Seated Quad Sets: Left;10 reps;Supine (3-5 sec hold.) Heel Slides: Left;10 reps;Supine (30 sec hold) Hip ABduction/ADduction: Left;10 reps;Supine Long Arc Quad: Left;5 reps;Seated Goniometric ROM: 10-94 degrees    General Comments General comments (skin integrity, edema, etc.): Pt's sister present for session      Pertinent Vitals/Pain Pain Assessment: No/denies pain (" I feel great!")    Home Living Family/patient expects to be discharged to:: Private residence Living Arrangements: Alone;Other relatives (sister will be staying with him for 3 weeks) Available Help at Discharge: Family;Available 24 hours/day Type of Home: House Home Access:  Stairs to enter Entrance Stairs-Rails: None Home Layout: Two level Home Equipment:  Walker - 2 wheels;Bedside commode      Prior Function Level of Independence: Independent          PT Goals (current goals can now be found in the care plan section) Acute Rehab PT Goals Patient Stated Goal: to go to a wedding on the 21st of May. PT Goal Formulation: With patient Time For Goal Achievement: 07/02/14 Potential to Achieve Goals: Good Progress towards PT goals: Progressing toward goals    Frequency  BID    PT Plan Current plan remains appropriate    Co-evaluation             End of Session Equipment Utilized During Treatment: Gait belt Activity Tolerance: Patient tolerated treatment well Patient left: in bed;with call bell/phone within reach;with bed alarm set     Time: 1409-1440 PT Time Calculation (min) (ACUTE ONLY): 31 min  Charges:  $Gait Training: 8-22 mins $Therapeutic Exercise: 8-22 mins $Therapeutic Activity: 8-22 mins                    G Codes:      Carl [name] 06/18/2014, 2:46 PM  Wray Kearns, [place], DPT [phone]

## 2014-06-18 NOTE — Discharge Instructions (Signed)
Information on my medicine - XARELTO® (Rivaroxaban) ° °This medication education was reviewed with me or my healthcare representative as part of my discharge preparation.  The pharmacist that spoke with me during my hospital stay was:  Bayleigh Loflin Stillinger, RPH ° °Why was Xarelto® prescribed for you? °Xarelto® was prescribed for you to reduce the risk of blood clots forming after orthopedic surgery. The medical term for these abnormal blood clots is venous thromboembolism (VTE). ° °What do you need to know about xarelto® ? °Take your Xarelto® ONCE DAILY at the same time every day. °You may take it either with or without food. ° °If you have difficulty swallowing the tablet whole, you may crush it and mix in applesauce just prior to taking your dose. ° °Take Xarelto® exactly as prescribed by your doctor and DO NOT stop taking Xarelto® without talking to the doctor who prescribed the medication.  Stopping without other VTE prevention medication to take the place of Xarelto® may increase your risk of developing a clot. ° °After discharge, you should have regular check-up appointments with your healthcare provider that is prescribing your Xarelto®.   ° °What do you do if you miss a dose? °If you miss a dose, take it as soon as you remember on the same day then continue your regularly scheduled once daily regimen the next day. Do not take two doses of Xarelto® on the same day.  ° °Important Safety Information °A possible side effect of Xarelto® is bleeding. You should call your healthcare provider right away if you experience any of the following: °? Bleeding from an injury or your nose that does not stop. °? Unusual colored urine (red or dark brown) or unusual colored stools (red or black). °? Unusual bruising for unknown reasons. °? A serious fall or if you hit your head (even if there is no bleeding). ° °Some medicines may interact with Xarelto® and might increase your risk of bleeding while on Xarelto®. To  help avoid this, consult your healthcare provider or pharmacist prior to using any new prescription or non-prescription medications, including herbals, vitamins, non-steroidal anti-inflammatory drugs (NSAIDs) and supplements. ° °This website has more information on Xarelto®: www.xarelto.com. ° ° ° °

## 2014-06-18 NOTE — Evaluation (Signed)
Occupational Therapy Evaluation Patient Details Name: Carl [name] MRN: [id number] DOB: 03/29/1945 Today's Date: 06/18/2014    History of Present Illness Patient is a 69 y/o male s/p L TKA. PMH of anxiety, HLD, depression, enlarged prostate.   Clinical Impression   PTA pt lived at home and was independent with ADLs. Pt is at Supervision level for functional mobility for safety and requires assist for LB ADLs. Pt will benefit from acute OT for LB ADLs and shower transfer training.     Follow Up Recommendations  No OT follow up    Equipment Recommendations  None recommended by OT    Recommendations for Other Services       Precautions / Restrictions Precautions Precautions: Knee Precaution Booklet Issued: Yes (comment) Precaution Comments: Reviewed no pillow under knee and HEP. Restrictions Weight Bearing Restrictions: Yes LLE Weight Bearing: Partial weight bearing LLE Partial Weight Bearing Percentage or Pounds: 50%      Mobility Bed Mobility Overal bed mobility: Modified Independent             General bed mobility comments: Pt sitting in recliner when OT arrived. Reports no difficulty getting OOB.  Transfers Overall transfer level: Needs assistance Equipment used: Rolling walker (2 wheeled) Transfers: Sit to/from Stand Sit to Stand: Supervision         General transfer comment: Supervision for safety. Pt mildly impulsive    Balance Overall balance assessment: Needs assistance Sitting-balance support: Feet supported;No upper extremity supported Sitting balance-Leahy Scale: Good     Standing balance support: During functional activity Standing balance-Leahy Scale: Fair                              ADL Overall ADL's : Needs assistance/impaired Eating/Feeding: Independent;Sitting   Grooming: Supervision/safety;Standing   Upper Body Bathing: Sitting;Set up   Lower Body Bathing: Minimal assistance;Sit to/from stand   Upper Body Dressing  : Set up;Sitting   Lower Body Dressing: Minimal assistance;Sit to/from stand Lower Body Dressing Details (indicate cue type and reason): pt nearly able to reach LLE for LB dressing.  Toilet Transfer: Ambulation;RW;Supervision/safety (BSC over toilet)   Toileting- Clothing Manipulation and Hygiene: Supervision/safety;Sit to/from Nurse, children's Details (indicate cue type and reason): educated on safe shower transfer technique Functional mobility during ADLs: Supervision/safety;Rolling walker General ADL Comments: Pt is mildly impulsive and eager to return to independence. Reinforced education on waiting for RN staff to assist pt to the bathroom. Also educated on moving slowly due to IV and hemovac.      Vision Additional Comments: No change from baseline          Pertinent Vitals/Pain Pain Assessment: No/denies pain ("I feel great!")     Hand Dominance     Extremity/Trunk Assessment Upper Extremity Assessment Upper Extremity Assessment: Overall WFL for tasks assessed   Lower Extremity Assessment Lower Extremity Assessment: Defer to PT evaluation LLE Deficits / Details: Able to perform LAQ with extension lag. LLE Sensation:  [facility]   Cervical / Trunk Assessment Cervical / Trunk Assessment: Normal   Communication Communication Communication: No difficulties   Cognition Arousal/Alertness: Awake/alert Behavior During Therapy: WFL for tasks assessed/performed;Impulsive Overall Cognitive Status: Within Functional Limits for tasks assessed                                Home Living Family/patient expects to be discharged to:: Private  residence Living Arrangements: Alone;Other relatives (sister will be staying with him for 3 weeks) Available Help at Discharge: Family;Available 24 hours/day Type of Home: House Home Access: Stairs to enter [employer] of Steps: 2 Entrance Stairs-Rails: None Home Layout: Two level Alternate Level  Stairs-Number of Steps: 1 flight Alternate Level Stairs-Rails: Right Bathroom Shower/Tub: Occupational psychologist: Standard     Home Equipment: Environmental consultant - 2 wheels;Bedside commode          Prior Functioning/Environment Level of Independence: Independent             OT Diagnosis: Generalized weakness;Acute pain   OT Problem List: Decreased strength;Decreased range of motion;Decreased activity tolerance;Impaired balance (sitting and/or standing);Decreased safety awareness;Decreased knowledge of use of DME or AE;Decreased knowledge of precautions;Pain   OT Treatment/Interventions: Self-care/ADL training;Therapeutic exercise;Energy conservation;DME and/or AE instruction;Therapeutic activities;Patient/family education;Balance training    OT Goals(Current goals can be found in the care plan section) Acute Rehab OT Goals Patient Stated Goal: to go to a wedding on the 21st of May. OT Goal Formulation: With patient Time For Goal Achievement: 07/02/14 Potential to Achieve Goals: Good ADL Goals Pt Will Perform Grooming: with modified independence;standing Pt Will Perform Lower Body Bathing: with set-up;with supervision;sit to/from stand Pt Will Perform Lower Body Dressing: with set-up;with supervision;sit to/from stand Pt Will Transfer to Toilet: with modified independence;ambulating;bedside commode Pt Will Perform Toileting - Clothing Manipulation and hygiene: with modified independence;sit to/from stand Pt Will Perform Tub/Shower Transfer: Shower transfer;with modified independence;ambulating;3 in 1;rolling walker  OT Frequency: Min 2X/week    End of Session Equipment Utilized During Treatment: Rolling walker CPM Left Knee CPM Left Knee: Off Left Knee Flexion (Degrees): 90 Nurse Communication: Other (comment) (pt on toilet and educated to call nursing staff for assistan)  Activity Tolerance: Patient tolerated treatment well Patient left: Other (comment);with  family/visitor present (sitting on toilet; educated on call button (pull string))   Time: 9381-0175 OT Time Calculation (min): 19 min Charges:  OT General Charges $OT Visit: 1 Procedure OT Evaluation $Initial OT Evaluation Tier I: 1 Procedure G-Codes:    Juluis Rainier 06-20-2014, 11:52 AM  Cyndie Chime, OTR/L Occupational Therapist [phone] (pager)

## 2014-06-18 NOTE — Progress Notes (Signed)
Patient ID: Carl [name], male   DOB: [birth year], 69 y.o.   MRN: [id number] PATIENT ID: Carl [name]        MRN:  [id number]          DOB/AGE: Feb 07, 1946 / 69 y.o.    [name], MD   Biagio Borg, PA-C [address] [place], [place]  [address]                             [phone]   PROGRESS NOTE  Subjective:  negative for Chest Pain  negative for Shortness of Breath  negative for Nausea/Vomiting   negative for Calf Pain    Tolerating Diet: yes         Patient reports pain as mild.     Minimal pain thru the night, comfortable  Objective: Vital signs in last 24 hours:   Patient Vitals for the past 24 hrs:  BP Temp Temp src Pulse Resp SpO2 Height Weight  06/18/14 0508 (!) 105/59 mmHg 98.6 F (37 C) Oral 61 17 97 % - -  06/17/14 2024 137/85 mmHg 97.7 F (36.5 C) Oral 91 19 100 % - -  06/17/14 1757 (!) 153/82 mmHg - - 62 18 100 % - -  06/17/14 1730 (!) 150/71 mmHg 97.6 F (36.4 C) - 72 (!) 25 100 % - -  06/17/14 1715 - - - 70 17 100 % - -  06/17/14 1700 (!) 157/91 mmHg - - (!) 55 16 100 % - -  06/17/14 1645 - - - 65 18 100 % - -  06/17/14 1630 - - - 66 15 100 % - -  06/17/14 1615 (!) 147/86 mmHg - - 67 20 100 % - -  06/17/14 1600 (!) 158/84 mmHg - - 65 14 100 % - -  06/17/14 1545 (!) 154/83 mmHg - - 62 11 100 % - -  06/17/14 1530 (!) 148/84 mmHg - - 65 (!) 7 99 % - -  06/17/14 1515 (!) 150/88 mmHg - - 62 - 100 % - -  06/17/14 1500 (!) 145/88 mmHg - - 61 (!) 23 100 % - -  06/17/14 1445 (!) 148/88 mmHg - - 60 14 100 % - -  06/17/14 1430 (!) 143/90 mmHg - - 61 14 100 % - -  06/17/14 1415 132/87 mmHg 98.2 F (36.8 C) - 64 10 100 % - -  06/17/14 0836 - - - - - - - 85.73 kg (189 lb)  06/17/14 0835 - - - - - - 5' 8.5" (1.74 m) -  06/17/14 5465 (!) 133/59 mmHg 97.5 F (36.4 C) Oral (!) 57 20 99 % - 87.998 kg (194 lb)      Intake/Output from previous day:   05/10 0701 - 05/11 0700 In: 1000 [I.V.:1000] Out: 850 [Drains:800]   Intake/Output this shift:       Intake/Output      05/10 0701 - 05/11 0700 05/11 0701 - 05/12 0700   I.V. (mL/kg) 1000 (11.7)    Total Intake(mL/kg) 1000 (11.7)    Urine (mL/kg/hr) 0    Drains 800    Stool 0    Blood 50    Total Output 850     Net +150          Urine Occurrence 2 x    Stool Occurrence 2 x       LABORATORY DATA:  Recent Labs  06/17/14 0901  06/18/14 0516  WBC 5.8 7.8  HGB 15.3 12.3*  HCT 44.8 36.3*  PLT 221 203    Recent Labs  06/17/14 0901  NA 138  K 4.5  CL 108  CO2 22  BUN 11  CREATININE 0.82  GLUCOSE 97  CALCIUM 9.1   Lab Results  Component Value Date   INR 0.93 06/17/2014   INR 1.07 05/16/2014   INR 0.9 01/28/2007    Recent Radiographic Studies :  Ct Abdomen Pelvis W Contrast  06/06/2014   CLINICAL DATA:  Follow-up diverticulitis. Resolved left lower quadrant pain. No new symptoms.  EXAM: CT ABDOMEN AND PELVIS WITH CONTRAST  TECHNIQUE: Multidetector CT imaging of the abdomen and pelvis was performed using the standard protocol following bolus administration of intravenous contrast.  CONTRAST:  176mL OMNIPAQUE IOHEXOL 300 MG/ML  SOLN  COMPARISON:  05/21/2014  FINDINGS: Visualized lung bases are clear.  The liver, gallbladder, spleen, adrenal glands, right kidney, and pancreas have an unremarkable enhanced appearance. Punctate nonobstructing left lower pole renal calculus is unchanged.  Oral contrast is present throughout nondilated small and large bowel to the level of the rectum without evidence of obstruction. Diverticulosis is again seen involving the sigmoid greater than descending colon. Proximal sigmoid colon wall thickening and pericolonic inflammation on the prior study have resolved. Appendix is unremarkable.  Bladder is largely decompressed. Mild aortic atherosclerosis is noted. No free fluid or enlarged lymph nodes are identified. There is a small fat containing left inguinal hernia. Moderate thoracolumbar spondylosis is noted.  IMPRESSION: Resolution of sigmoid  diverticulitis.  No acute abnormality.   Electronically Signed   By: Logan Bores   On: 06/06/2014 10:01   Ct Abdomen Pelvis W Contrast  05/21/2014   CLINICAL DATA:  Lower abdominal pain for 1 week  EXAM: CT ABDOMEN AND PELVIS WITH CONTRAST  TECHNIQUE: Multidetector CT imaging of the abdomen and pelvis was performed using the standard protocol following bolus administration of intravenous contrast.  CONTRAST:  100 cc omni  300  COMPARISON:  01/17/2014  FINDINGS: Lower chest: The lung bases appear clear. No pleural or pericardial effusion identified.  Hepatobiliary: There is no suspicious liver abnormality identified. The gallbladder appears within normal limits. No biliary dilatation.  Pancreas: Negative  Spleen: Negative  Adrenals/Urinary Tract: Normal appearance of the adrenal glands. The right kidney is normal. The left kidney is also within normal limits. The urinary bladder appears normal.  Stomach/Bowel: The stomach is within normal limits. The small bowel loops have a normal course and caliber. No obstruction. The appendix is visualized and appears normal. Distal colonic diverticulosis is noted. There is a focal area of inflammation involving the proximal sigmoid colon compatible with acute diverticulitis, image 56/series 2. No significant free intraperitoneal air. No abscess identified.  Vascular/Lymphatic: Calcified atherosclerotic disease involves the abdominal aorta. No aneurysm. No enlarged retroperitoneal or mesenteric adenopathy. No enlarged pelvic or inguinal lymph nodes.  Reproductive: Prostate gland and seminal vesicles are unremarkable.  Other: A small amount of free fluid is identified within the left lower quadrant of the abdomen associated with diverticulitis of the sigmoid colon. No abscess identified.  Musculoskeletal: Review of the visualized osseous structures is remarkable for multi level degenerative disc disease within the lumbar spine. No aggressive lytic or sclerotic bone lesions.   IMPRESSION: 1. Findings compatible with acute diverticulitis involving the proximal sigmoid colon. 2. No evidence for bowel obstruction or abscess identified at this time. 3. Atherosclerotic disease.   Electronically Signed   By: Lovena Le  Clovis Riley M.D.   On: 05/21/2014 16:28     Examination:  General appearance: alert, cooperative and no distress  Wound Exam: clean, dry, intact   Drainage:  Moderate amount Serosanguinous exudate-300cc in hemovac over last shift  Motor Exam: EHL, FHL, Anterior Tibial and Posterior Tibial Intact  Sensory Exam: Superficial Peroneal, Deep Peroneal and Tibial normal  Vascular Exam: Normal  Assessment:    1 Day Post-Op  Procedure(s) (LRB): TOTAL KNEE ARTHROPLASTY (Left)  ADDITIONAL DIAGNOSIS:  Principal Problem:   Primary osteoarthritis of left knee Active Problems:   S/P total knee replacement using cement  no new problems   Plan: Physical Therapy as ordered Partial Weight Bearing @ 50% (PWB)  DVT Prophylaxis:  Xarelto, Foot Pumps and TED hose  DISCHARGE PLAN: Home  DISCHARGE NEEDS: HHPT, CPM, Walker and 3-in-1 comode seat Voiding without difficulty, OOB with PT, saline lock IV, D/C hemovac in am       Garald Balding  06/18/2014 7:39 AM   PATIENT ID: RENNE PLATTS        MRN:  [id number]          DOB/AGE: Nov 01, 1945 / 69 y.o.

## 2014-06-19 LAB — BASIC METABOLIC PANEL
Anion gap: 6 (ref 5–15)
BUN: 14 mg/dL (ref 6–20)
CALCIUM: 8.2 mg/dL — AB (ref 8.9–10.3)
CO2: 25 mmol/L (ref 22–32)
Chloride: 103 mmol/L (ref 101–111)
Creatinine, Ser: 0.98 mg/dL (ref 0.61–1.24)
Glucose, Bld: 109 mg/dL — ABNORMAL HIGH (ref 65–99)
Potassium: 4 mmol/L (ref 3.5–5.1)
Sodium: 134 mmol/L — ABNORMAL LOW (ref 135–145)

## 2014-06-19 LAB — CBC
HEMATOCRIT: 32 % — AB (ref 39.0–52.0)
HEMOGLOBIN: 10.9 g/dL — AB (ref 13.0–17.0)
MCH: 32.2 pg (ref 26.0–34.0)
MCHC: 34.1 g/dL (ref 30.0–36.0)
MCV: 94.4 fL (ref 78.0–100.0)
Platelets: 189 10*3/uL (ref 150–400)
RBC: 3.39 MIL/uL — ABNORMAL LOW (ref 4.22–5.81)
RDW: 13.3 % (ref 11.5–15.5)
WBC: 8.5 10*3/uL (ref 4.0–10.5)

## 2014-06-19 MED ORDER — METHOCARBAMOL 500 MG PO TABS: 500.0000 mg | ORAL_TABLET | Freq: Three times a day (TID) | ORAL | Status: DC | PRN

## 2014-06-19 MED ORDER — ZOLPIDEM TARTRATE ER 12.5 MG PO TBCR: 12.5000 mg | EXTENDED_RELEASE_TABLET | Freq: Every evening | ORAL | Status: DC | PRN

## 2014-06-19 MED ORDER — RIVAROXABAN 10 MG PO TABS: 10.0000 mg | ORAL_TABLET | Freq: Every day | ORAL | Status: DC

## 2014-06-19 MED ORDER — OXYCODONE HCL 5 MG PO TABS: 5.0000 mg | ORAL_TABLET | ORAL | Status: DC | PRN

## 2014-06-19 NOTE — Progress Notes (Signed)
Physical Therapy Treatment Patient Details Name: Carl [name] MRN: [id number] DOB: 25-Mar-1945 Today's Date: 06/19/2014    History of Present Illness Patient is a 69 y/o male s/p L TKA. PMH of anxiety, HLD, depression, enlarged prostate.    PT Comments    Patient progressing well towards PT goals. More drowsy today suspect due to medications. Performed stair training with sister present with Min guard assist for safety. Reviewed HEP. Pt safe to discharge home from a mobility stand point as pt will have 24/7 S from sister and ambulating at [place] guard-Supervision level.    Follow Up Recommendations  Home health PT;Supervision/Assistance - 24 hour     Equipment Recommendations  None recommended by PT    Recommendations for Other Services       Precautions / Restrictions Precautions Precautions: Knee Precaution Comments: Reviewed no pillow under knee and HEP. Restrictions Weight Bearing Restrictions: Yes LLE Weight Bearing: Partial weight bearing LLE Partial Weight Bearing Percentage or Pounds: 50    Mobility  Bed Mobility Overal bed mobility: Modified Independent;Needs Assistance Bed Mobility: Supine to Sit;Sit to Supine     Supine to sit: Modified independent (Device/Increase time) Sit to supine: Modified independent (Device/Increase time)   General bed mobility comments: Sitting in chair upon PT arrival.   Transfers Overall transfer level: Needs assistance Equipment used: Rolling walker (2 wheeled) Transfers: Sit to/from Stand Sit to Stand: Supervision         General transfer comment: Supervision for safety. Pt mildly impulsive. Cues to adhere to WB precautions.  Ambulation/Gait Ambulation/Gait assistance: Min guard Ambulation Distance (Feet): 100 Feet Assistive device: Rolling walker (2 wheeled) Gait Pattern/deviations: Step-to pattern;Decreased stance time - left;Decreased step length - right;Trunk flexed Gait velocity: decreased Gait velocity interpretation:  Below normal speed for age/gender General Gait Details: Cues for PWB LLE during gait training and to increase knee flexion during swing phase of gait.   Stairs Stairs: Yes Stairs assistance: Min guard Stair Management: One rail Right;Step to pattern;Forwards Number of Stairs: 12 General stair comments: Cues for technique and to adhere to WB precautions.  Wheelchair Mobility    Modified Rankin (Stroke Patients Only)       Balance Overall balance assessment: Needs assistance Sitting-balance support: Feet supported;No upper extremity supported Sitting balance-Leahy Scale: Good     Standing balance support: During functional activity Standing balance-Leahy Scale: Fair                      Cognition Arousal/Alertness: Lethargic;Suspect due to medications Behavior During Therapy: [facility] for tasks assessed/performed Overall Cognitive Status: Within Functional Limits for tasks assessed                      Exercises Total Joint Exercises Quad Sets: Left;Supine;5 reps Goniometric ROM: -8-85 degrees knee AROM    General Comments General comments (skin integrity, edema, etc.): Pt's sister present for session and during stair training.       Pertinent Vitals/Pain Pain Assessment: No/denies pain    Home Living                      Prior Function            PT Goals (current goals can now be found in the care plan section) Progress towards PT goals: Progressing toward goals    Frequency  BID    PT Plan Current plan remains appropriate    Co-evaluation  End of Session Equipment Utilized During Treatment: Gait belt Activity Tolerance: Patient tolerated treatment well Patient left: in chair;with call bell/phone within reach;with family/visitor present     Time: 1947-1252 PT Time Calculation (min) (ACUTE ONLY): 18 min  Charges:  $Gait Training: 8-22 mins                    G Codes:      Marquist Binstock A Linzy Darling 06/19/2014,  12:10 PM  Wray Kearns, [place], DPT [phone]

## 2014-06-19 NOTE — Progress Notes (Signed)
Occupational Therapy Treatment Patient Details Name: Carl [name] MRN: [id number] DOB: September 30, 1945 Today's Date: 06/19/2014    History of present illness Patient is a 69 y/o male s/p L TKA. PMH of anxiety, HLD, depression, enlarged prostate.   OT comments  Ready for DC from OT standpoint  Follow Up Recommendations  No OT follow up    Equipment Recommendations  None recommended by OT       Precautions / Restrictions Restrictions Weight Bearing Restrictions: Yes LLE Weight Bearing: Partial weight bearing LLE Partial Weight Bearing Percentage or Pounds: 50       Mobility Bed Mobility Overal bed mobility: Modified Independent;Needs Assistance Bed Mobility: Supine to Sit;Sit to Supine     Supine to sit: Modified independent (Device/Increase time) Sit to supine: Modified independent (Device/Increase time)   General bed mobility comments: HOB flat, no rails or use of trapeze bar to simulate home.   Transfers Overall transfer level: Needs assistance Equipment used: Rolling walker (2 wheeled)   Sit to Stand: Supervision         General transfer comment: Supervision for safety. Pt mildly impulsive. Cues to adhere to WB precautions.        ADL       Grooming: Supervision/safety;Standing           Upper Body Dressing : Set up;Sitting   Lower Body Dressing: Supervision/safety;Sit to/from stand   Toilet Transfer: Ambulation;RW;Supervision/safety   Toileting- Water quality scientist and Hygiene: Supervision/safety;Sit to/from Nurse, children's Details (indicate cue type and reason): educated on safe shower transfer technique   General ADL Comments: pts sister there and will a him at home as needed                Cognition   Behavior During Therapy: WFL for tasks assessed/performed Overall Cognitive Status: Within Functional Limits for tasks assessed                                    Pertinent Vitals/ Pain       Pain Assessment:  No/denies pain         Frequency       Progress Toward Goals  OT Goals(current goals can now be found in the care plan section)  Progress towards OT goals: Progressing toward goals     Plan Discharge plan remains appropriate       End of Session Equipment Utilized During Treatment: Rolling walker   Activity Tolerance Patient tolerated treatment well   Patient Left in chair;with call bell/phone within reach;with family/visitor present           Time: 2563-8937 OT Time Calculation (min): 24 min  Charges: OT General Charges $OT Visit: 1 Procedure OT Treatments $Self Care/Home Management : 23-37 mins  Recie Carl [name] 06/19/2014, 11:42 AM

## 2014-06-19 NOTE — Progress Notes (Signed)
PT Cancellation Note  Patient Details Name: Carl [name] MRN: [id number] DOB: 11-Apr-1945   Cancelled Treatment:    Reason Eval/Treat Not Completed: Patient's level of consciousness  Attempted to perform PT tx however pt not able to stay aroused to participate. Snoring and falling asleep consistently. Will follow up later in AM to perform stair negotiation.  Grant 06/19/2014, 10:36 AM Wray Kearns, PT, DPT [phone]

## 2014-06-19 NOTE — Discharge Summary (Signed)
Carl Fears, MD   Carl Borg, PA-C [address] [place], [place]  [address]                             [phone]  PATIENT ID: Carl [name]        MRN:  [id number]          DOB/AGE: 1945/04/12 / 69 y.o.    DISCHARGE SUMMARY  ADMISSION DATE:    06/17/2014 DISCHARGE DATE:   06/19/2014   ADMISSION DIAGNOSIS: END STAGE OSTEOARTHRITIS OF LEFT KNEE    DISCHARGE DIAGNOSIS:  END STAGE OSTEOARTHRITIS OF LEFT KNEE    ADDITIONAL DIAGNOSIS: Principal Problem:   Primary osteoarthritis of left knee Active Problems:   S/P total knee replacement using cement  Past Medical History  Diagnosis Date  . Diverticulitis   . Diverticulosis   . Esophagitis   . Gastric ulcer   . Hx of adenomatous colonic polyps     benign  . Hyperlipidemia     takes Zocor daily  . Anxiety     takes Xanax daily as needed  . Insomnia     takes Ambien nightly  . GERD (gastroesophageal reflux disease)     takes Protonix daily  . Depression     takes Celexa daily  . Arthritis   . Joint pain   . Joint swelling   . Enlarged prostate     sightly    PROCEDURE: Procedure(s): TOTAL KNEE ARTHROPLASTY Left on 06/17/2014  CONSULTS: none     HISTORY: [name] is a very pleasant 69 year old white male who is seen today for evaluation of his left knee. He has had pain in the left knee for many years. He has undergone arthroscopic debridement in the past for this. He states he has had several scopes on the knee, which have been beneficial at the time, but he is now persistently having pain and discomfort. He did have a traumatic episode 18 years ago when he fell off a scaffolding, as he is a Museum/gallery curator. He did have multiple cortisone injections, but has gotten now to the point where they are not as beneficial. He is now having pain with every step, nighttime pain. It is interfering with his activities of daily living and what he likes to do for recreation. He has not gotten to the point where he has had to  use a cane, but certainly the pain has been bad enough for this. Again, he has been tried with corticosteroid injections and surgery and now is to the point where he is having difficulty with his activities of daily living.  HOSPITAL COURSE:  HELIODORO DOMAGALSKI is a 69 y.o. admitted on 06/17/2014 and found to have a diagnosis of END STAGE OSTEOARTHRITIS OF LEFT KNEE.  After appropriate laboratory studies were obtained  they were taken to the operating room on 06/17/2014 and underwent  Procedure(s): TOTAL KNEE ARTHROPLASTY  Left.   They were given perioperative antibiotics:  Anti-infectives    Start     Dose/Rate Route Frequency Ordered Stop   06/17/14 1930  valACYclovir (VALTREX) tablet 500 mg     500 mg Oral Daily 06/17/14 1758     06/17/14 1845  ceFAZolin (ANCEF) IVPB 2 g/50 mL premix     2 g 100 mL/hr over 30 Minutes Intravenous Every 6 hours 06/17/14 1758 06/18/14 0644   06/17/14 0930  ceFAZolin (ANCEF) IVPB 2 g/50 mL premix  Status:  Discontinued  2 g 100 mL/hr over 30 Minutes Intravenous To Surgery 06/16/14 1638 06/17/14 1745   06/17/14 0757  ceFAZolin (ANCEF) IVPB 2 g/50 mL premix     2 g 100 mL/hr over 30 Minutes Intravenous On call to O.R. 06/17/14 0757 06/17/14 1110    .  Tolerated the procedure well.      Toradol was given post op.  POD #1, allowed out of bed to a chair.  PT for ambulation and exercise program.   IV saline locked.  O2 discontionued.  POD #2, continued PT and ambulation.   Hemovac pulled.  Minimal pain, desires discharge  The remainder of the hospital course was dedicated to ambulation and strengthening.   The patient was discharged on 2 Days Post-Op in  Stable condition.  Blood products given:none  DIAGNOSTIC STUDIES: Recent vital signs:  Patient Vitals for the past 24 hrs:  BP Temp Temp src Pulse Resp SpO2  06/19/14 0513 126/68 mmHg 98.4 F (36.9 C) Oral 73 16 93 %  06/18/14 2026 128/69 mmHg 98.6 F (37 C) Oral 72 17 99 %  06/18/14 1356 (!) 115/52 mmHg  97.4 F (36.3 C) Oral 64 18 98 %       Recent laboratory studies:  Recent Labs  06/17/14 0901 06/18/14 0516 06/19/14 0450  WBC 5.8 7.8 8.5  HGB 15.3 12.3* 10.9*  HCT 44.8 36.3* 32.0*  PLT 221 203 189    Recent Labs  06/17/14 0901 06/18/14 0516 06/19/14 0450  NA 138 136 134*  K 4.5 4.6 4.0  CL 108 102 103  CO2 22 27 25   BUN 11 13 14   CREATININE 0.82 0.97 0.98  GLUCOSE 97 120* 109*  CALCIUM 9.1 8.4* 8.2*   Lab Results  Component Value Date   INR 0.93 06/17/2014   INR 1.07 05/16/2014   INR 0.9 01/28/2007     Recent Radiographic Studies :  Ct Abdomen Pelvis W Contrast  06/06/2014   CLINICAL DATA:  Follow-up diverticulitis. Resolved left lower quadrant pain. No new symptoms.  EXAM: CT ABDOMEN AND PELVIS WITH CONTRAST  TECHNIQUE: Multidetector CT imaging of the abdomen and pelvis was performed using the standard protocol following bolus administration of intravenous contrast.  CONTRAST:  173mL OMNIPAQUE IOHEXOL 300 MG/ML  SOLN  COMPARISON:  05/21/2014  FINDINGS: Visualized lung bases are clear.  The liver, gallbladder, spleen, adrenal glands, right kidney, and pancreas have an unremarkable enhanced appearance. Punctate nonobstructing left lower pole renal calculus is unchanged.  Oral contrast is present throughout nondilated small and large bowel to the level of the rectum without evidence of obstruction. Diverticulosis is again seen involving the sigmoid greater than descending colon. Proximal sigmoid colon wall thickening and pericolonic inflammation on the prior study have resolved. Appendix is unremarkable.  Bladder is largely decompressed. Mild aortic atherosclerosis is noted. No free fluid or enlarged lymph nodes are identified. There is a small fat containing left inguinal hernia. Moderate thoracolumbar spondylosis is noted.  IMPRESSION: Resolution of sigmoid diverticulitis.  No acute abnormality.   Electronically Signed   By: Logan Bores   On: 06/06/2014 10:01   Ct Abdomen  Pelvis W Contrast  05/21/2014   CLINICAL DATA:  Lower abdominal pain for 1 week  EXAM: CT ABDOMEN AND PELVIS WITH CONTRAST  TECHNIQUE: Multidetector CT imaging of the abdomen and pelvis was performed using the standard protocol following bolus administration of intravenous contrast.  CONTRAST:  100 cc omni  300  COMPARISON:  01/17/2014  FINDINGS: Lower chest: The lung  bases appear clear. No pleural or pericardial effusion identified.  Hepatobiliary: There is no suspicious liver abnormality identified. The gallbladder appears within normal limits. No biliary dilatation.  Pancreas: Negative  Spleen: Negative  Adrenals/Urinary Tract: Normal appearance of the adrenal glands. The right kidney is normal. The left kidney is also within normal limits. The urinary bladder appears normal.  Stomach/Bowel: The stomach is within normal limits. The small bowel loops have a normal course and caliber. No obstruction. The appendix is visualized and appears normal. Distal colonic diverticulosis is noted. There is a focal area of inflammation involving the proximal sigmoid colon compatible with acute diverticulitis, image 56/series 2. No significant free intraperitoneal air. No abscess identified.  Vascular/Lymphatic: Calcified atherosclerotic disease involves the abdominal aorta. No aneurysm. No enlarged retroperitoneal or mesenteric adenopathy. No enlarged pelvic or inguinal lymph nodes.  Reproductive: Prostate gland and seminal vesicles are unremarkable.  Other: A small amount of free fluid is identified within the left lower quadrant of the abdomen associated with diverticulitis of the sigmoid colon. No abscess identified.  Musculoskeletal: Review of the visualized osseous structures is remarkable for multi level degenerative disc disease within the lumbar spine. No aggressive lytic or sclerotic bone lesions.  IMPRESSION: 1. Findings compatible with acute diverticulitis involving the proximal sigmoid colon. 2. No evidence for  bowel obstruction or abscess identified at this time. 3. Atherosclerotic disease.   Electronically Signed   By: Kerby Moors M.D.   On: 05/21/2014 16:28    DISCHARGE INSTRUCTIONS:     Discharge Instructions    CPM    Complete by:  As directed   Continuous passive motion machine (CPM):      Use the CPM from 0 to 60 degrees for 6-8 hours per day.      You may increase by 5-10 per day.  You may break it up into 2 or 3 sessions per day.      Use CPM for 3-4 weeks or until you are told to stop.     Call MD / Call 911    Complete by:  As directed   If you experience chest pain or shortness of breath, CALL 911 and be transported to the hospital emergency room.  If you develope a fever above 101 F, pus (white drainage) or increased drainage or redness at the wound, or calf pain, call your surgeon's office.     Change dressing    Complete by:  As directed   DO NOT CHANGE YOUR DRESSING.     Constipation Prevention    Complete by:  As directed   Drink plenty of fluids.  Prune juice may be helpful.  You may use a stool softener, such as Colace (over the counter) 100 mg twice a day.  Use MiraLax (over the counter) for constipation as needed.     Diet general    Complete by:  As directed      Discharge instructions    Complete by:  As directed   [place] items at home which could result in a fall. This includes throw rugs or furniture in walking pathways ICE to the affected joint every three hours while awake for 30 minutes at a time, for at least the first 3-5 days, and then as needed for pain and swelling.  Continue to use ice for pain and swelling. You may notice swelling that will progress down to the foot and ankle.  This is normal after surgery.  Elevate your  leg when you are not up walking on it.   Continue to use the breathing machine you got in the hospital (incentive spirometer) which will help keep your temperature down.  It is common for your  temperature to cycle up and down following surgery, especially at night when you are not up moving around and exerting yourself.  The breathing machine keeps your lungs expanded and your temperature down.   DIET:  As you were doing prior to hospitalization, we recommend a well-balanced diet.  DRESSING / WOUND CARE / SHOWERING  Keep the surgical dressing until follow up.  The dressing is water proof, so you can shower without any extra covering.  IF THE DRESSING FALLS OFF or the wound gets wet inside, change the dressing with sterile gauze.  Please use good hand washing techniques before changing the dressing.  Do not use any lotions or creams on the incision until instructed by your surgeon.    ACTIVITY  Increase activity slowly as tolerated, but follow the weight bearing instructions below.   No driving for 6 weeks or until further direction given by your physician.  You cannot drive while taking narcotics.  No lifting or carrying greater than 10 lbs. until further directed by your surgeon. Avoid periods of inactivity such as sitting longer than an hour when not asleep. This helps prevent blood clots.  You may return to work once you are authorized by your doctor.     WEIGHT BEARING   Partial weight bearing with assist device as directed.     EXERCISES  Results after joint replacement surgery are often greatly improved when you follow the exercise, range of motion and muscle strengthening exercises prescribed by your doctor. Safety measures are also important to protect the joint from further injury. Any time any of these exercises cause you to have increased pain or swelling, decrease what you are doing until you are comfortable again and then slowly increase them. If you have problems or questions, call your caregiver or physical therapist for advice.   Rehabilitation is important following a joint replacement. After just a few days of immobilization, the muscles of the leg can become  weakened and shrink (atrophy).  These exercises are designed to build up the tone and strength of the thigh and leg muscles and to improve motion. Often times heat used for twenty to thirty minutes before working out will loosen up your tissues and help with improving the range of motion but do not use heat for the first two weeks following surgery (sometimes heat can increase post-operative swelling).   These exercises can be done  on a table or on a bed. Use whatever works the best and is most comfortable for you.    Use music or television while you are exercising so that the exercises are a pleasant break in your day. This will make your life better with the exercises acting as a break in your routine that you can look forward to.   Perform all exercises about fifteen times, three times per day or as directed.  You should exercise both the operative leg and the other leg as well.   Exercises include:   Quad Sets - Tighten up the muscle on the front of the thigh (Quad) and hold for 5-10 seconds.   Straight Leg Raises - With your knee straight (if you were given a brace, keep it on), lift the leg to 60 degrees, hold for 3 seconds, and slowly lower the leg.  Perform this exercise against resistance later as your leg gets stronger.  Leg Slides: Lying on your back, slowly slide your foot toward your buttocks, bending your knee up off the floor (only go as far as is comfortable). Then slowly slide your foot back down until your leg is flat on the floor again.  Angel Wings: Lying on your back spread your legs to the side as far apart as you can without causing discomfort.  Hamstring Strength:  Lying on your back, push your heel against the floor with your leg straight by tightening up the muscles of your buttocks.  Repeat, but this time bend your knee to a comfortable angle, and push your heel against the floor.  You may put a pillow under the heel to make it more comfortable if necessary.   A  rehabilitation program following joint replacement surgery can speed recovery and prevent re-injury in the future due to weakened muscles. Contact your doctor or a physical therapist for more information on knee rehabilitation.    CONSTIPATION  Constipation is defined medically as fewer than three stools per week and severe constipation as less than one stool per week.  Even if you have a regular bowel pattern at home, your normal regimen is likely to be disrupted due to multiple reasons following surgery.  Combination of anesthesia, postoperative narcotics, change in appetite and fluid intake all can affect your bowels.   YOU MUST use at least one of the following options; they are listed in order of increasing strength to get the job done.  They are all available over the counter, and you may need to use some, POSSIBLY even all of these options:    Drink plenty of fluids (prune juice may be helpful) and high fiber foods Colace 100 mg by mouth twice a day  Senokot for constipation as directed and as needed Dulcolax (bisacodyl), take with full glass of water  Miralax (polyethylene glycol) once or twice a day as needed.  If you have tried all these things and are unable to have a bowel movement in the first 3-4 days after surgery call either your surgeon or your primary doctor.    If you experience loose stools or diarrhea, hold the medications until you stool forms back up.  If your symptoms do not get better within 1 week or if they get worse, check with your doctor.  If you experience "the worst abdominal pain ever" or develop nausea or vomiting, please contact the office immediately for further recommendations for treatment.   ITCHING:  If you experience itching with your medications, try taking only a single pain pill, or even half a pain pill at a time.  You can also use Benadryl over the counter for itching or also to help with sleep.   TED HOSE STOCKINGS:  Use stockings on both legs until  for at least 2 weeks or as directed by physician office. They may be removed at night for sleeping.  MEDICATIONS:  See your medication summary on the "After Visit Summary" that nursing will review with you.  You may have some home medications which will be placed on hold until you complete the course of blood thinner medication.  It is important for you to complete the blood thinner medication as prescribed.  PRECAUTIONS:  If you experience chest pain or shortness of breath - call 911 immediately for transfer to the hospital emergency department.   If you develop a fever greater that 101 F, purulent drainage  from wound, increased redness or drainage from wound, foul odor from the wound/dressing, or calf pain - CONTACT YOUR SURGEON.                                                   FOLLOW-UP APPOINTMENTS:  If you do not already have a post-op appointment, please call the office for an appointment to be seen by your surgeon.  Guidelines for how soon to be seen are listed in your "After Visit Summary", but are typically between 1-4 weeks after surgery.  OTHER INSTRUCTIONS:   Knee Replacement:  Do not place pillow under knee, focus on keeping the knee straight while resting. CPM instructions: 0-90 degrees, 2 hours in the morning, 2 hours in the afternoon, and 2 hours in the evening. Place foam block, curve side up under heel at all times except when in CPM or when walking.  DO NOT modify, tear, cut, or change the foam block in any way.  MAKE SURE YOU:  Understand these instructions.  Get help right away if you are not doing well or get worse.    Thank you for letting us be a part of your medical care team.  It is a privilege we respect greatly.  We hope these instructions will help you stay on track for a fast and full recovery!     Do not put a pillow under the knee. Place it under the heel.    Complete by:  As directed      Driving restrictions    Complete by:  As directed   No driving for 6  weeks     Increase activity slowly as tolerated    Complete by:  As directed      Lifting restrictions    Complete by:  As directed   No lifting for 6 weeks     Partial weight bearing    Complete by:  As directed   % Body Weight:  50%  Laterality:  left  Extremity:  Lower     Patient may shower    Complete by:  As directed   You may shower over your dressing     TED hose    Complete by:  As directed   Use stockings (TED hose) for 3 weeks on left  leg.  You may remove them at night for sleeping.           DISCHARGE MEDICATIONS:     Medication List    STOP taking these medications        ibuprofen 200 MG tablet  Commonly known as:  ADVIL,MOTRIN     sildenafil 20 MG tablet  Commonly known as:  REVATIO      TAKE these medications        ALPRAZolam 0.25 MG tablet  Commonly known as:  XANAX  Take 0.25 mg by mouth daily as needed for anxiety.     beta carotene w/minerals tablet  Take 1 tablet by mouth daily.     bifidobacterium infantis capsule  Take 1 cap daily for 21 days.     citalopram 10 MG tablet  Commonly known as:  CELEXA  Take 10 mg by mouth daily.     [employer] JOINT HEALTH TRIPLE ACTION PO  Take 1 tablet by mouth daily.     methocarbamol 500 MG tablet  Commonly known as:  ROBAXIN  Take 1 tablet (500 mg total) by mouth every 8 (eight) hours as needed for muscle spasms.     oxyCODONE 5 MG immediate release tablet  Commonly known as:  Oxy IR/ROXICODONE  Take 1-2 tablets (5-10 mg total) by mouth every 4 (four) hours as needed for breakthrough pain.     pantoprazole 20 MG tablet  Commonly known as:  PROTONIX  Take 2 tablets (40 mg total) by mouth daily.     simvastatin 40 MG tablet  Commonly known as:  ZOCOR  Take 40 mg by mouth every evening.     valACYclovir 500 MG tablet  Commonly known as:  VALTREX  Take 500 mg by mouth daily.     zolpidem 12.5 MG CR tablet  Commonly known as:  AMBIEN CR  Take 12.5 mg by mouth at bedtime.        FOLLOW UP  VISIT:   Follow-up Information    Follow up with [facility].   Why:  Someone from [facility] will contact you with start date and time for therapy.   Contact information:   [address] [place] [address] [phone]       Follow up with Garald Balding, MD On 06/30/2014.   Specialty:  Orthopedic Surgery   Contact information:   [place] [place] [address] [phone]       DISPOSITION:   Home  CONDITION:  Stable   Mike Craze. [place], [place] [phone]  06/19/2014 1:49 PM

## 2014-06-19 NOTE — Progress Notes (Signed)
Patient prepared for d/c. IV access has been removed. Medicated for pain in anticipation car ride home. D/C instructions and prescriptions reviewed with patient and his sister. Knee surgical dressing intact with TED hose. Nursing Tech taking patient via w/c to [place] front exit with his sister and belongings.

## 2014-06-20 DIAGNOSIS — Z471 Aftercare following joint replacement surgery: Secondary | ICD-10-CM | POA: Diagnosis not present

## 2014-06-20 DIAGNOSIS — Z96652 Presence of left artificial knee joint: Secondary | ICD-10-CM | POA: Diagnosis not present

## 2014-06-20 DIAGNOSIS — R269 Unspecified abnormalities of gait and mobility: Secondary | ICD-10-CM | POA: Diagnosis not present

## 2014-06-22 ENCOUNTER — Telehealth: Payer: Self-pay | Admitting: Gastroenterology

## 2014-06-23 DIAGNOSIS — Z96652 Presence of left artificial knee joint: Secondary | ICD-10-CM | POA: Diagnosis not present

## 2014-06-23 DIAGNOSIS — R269 Unspecified abnormalities of gait and mobility: Secondary | ICD-10-CM | POA: Diagnosis not present

## 2014-06-23 DIAGNOSIS — Z471 Aftercare following joint replacement surgery: Secondary | ICD-10-CM | POA: Diagnosis not present

## 2014-06-24 ENCOUNTER — Telehealth: Payer: Self-pay | Admitting: Internal Medicine

## 2014-06-24 NOTE — Telephone Encounter (Signed)
Pt states he had his surgery on his knee and is doing great but constipated. Pt wants to know what he can take for the constipation. Discussed with pt that he can take miralax that is OTC for the constipation. Pt verbalized understanding.

## 2014-06-25 DIAGNOSIS — R269 Unspecified abnormalities of gait and mobility: Secondary | ICD-10-CM | POA: Diagnosis not present

## 2014-06-25 DIAGNOSIS — Z96652 Presence of left artificial knee joint: Secondary | ICD-10-CM | POA: Diagnosis not present

## 2014-06-25 DIAGNOSIS — Z471 Aftercare following joint replacement surgery: Secondary | ICD-10-CM | POA: Diagnosis not present

## 2014-06-27 DIAGNOSIS — R269 Unspecified abnormalities of gait and mobility: Secondary | ICD-10-CM | POA: Diagnosis not present

## 2014-06-27 DIAGNOSIS — Z96652 Presence of left artificial knee joint: Secondary | ICD-10-CM | POA: Diagnosis not present

## 2014-06-27 DIAGNOSIS — Z471 Aftercare following joint replacement surgery: Secondary | ICD-10-CM | POA: Diagnosis not present

## 2014-06-30 DIAGNOSIS — M25561 Pain in right knee: Secondary | ICD-10-CM | POA: Diagnosis not present

## 2014-06-30 DIAGNOSIS — M1712 Unilateral primary osteoarthritis, left knee: Secondary | ICD-10-CM | POA: Diagnosis not present

## 2014-07-01 DIAGNOSIS — Z471 Aftercare following joint replacement surgery: Secondary | ICD-10-CM | POA: Diagnosis not present

## 2014-07-01 DIAGNOSIS — R269 Unspecified abnormalities of gait and mobility: Secondary | ICD-10-CM | POA: Diagnosis not present

## 2014-07-01 DIAGNOSIS — Z96652 Presence of left artificial knee joint: Secondary | ICD-10-CM | POA: Diagnosis not present

## 2014-07-02 DIAGNOSIS — Z96652 Presence of left artificial knee joint: Secondary | ICD-10-CM | POA: Diagnosis not present

## 2014-07-02 DIAGNOSIS — Z471 Aftercare following joint replacement surgery: Secondary | ICD-10-CM | POA: Diagnosis not present

## 2014-07-02 DIAGNOSIS — R269 Unspecified abnormalities of gait and mobility: Secondary | ICD-10-CM | POA: Diagnosis not present

## 2014-07-04 DIAGNOSIS — R269 Unspecified abnormalities of gait and mobility: Secondary | ICD-10-CM | POA: Diagnosis not present

## 2014-07-04 DIAGNOSIS — Z471 Aftercare following joint replacement surgery: Secondary | ICD-10-CM | POA: Diagnosis not present

## 2014-07-04 DIAGNOSIS — Z96652 Presence of left artificial knee joint: Secondary | ICD-10-CM | POA: Diagnosis not present

## 2014-07-09 DIAGNOSIS — Z96652 Presence of left artificial knee joint: Secondary | ICD-10-CM | POA: Diagnosis not present

## 2014-07-09 DIAGNOSIS — R262 Difficulty in walking, not elsewhere classified: Secondary | ICD-10-CM | POA: Diagnosis not present

## 2014-07-09 DIAGNOSIS — M25662 Stiffness of left knee, not elsewhere classified: Secondary | ICD-10-CM | POA: Diagnosis not present

## 2014-07-09 DIAGNOSIS — M25562 Pain in left knee: Secondary | ICD-10-CM | POA: Diagnosis not present

## 2014-07-16 DIAGNOSIS — R262 Difficulty in walking, not elsewhere classified: Secondary | ICD-10-CM | POA: Diagnosis not present

## 2014-07-16 DIAGNOSIS — Z96652 Presence of left artificial knee joint: Secondary | ICD-10-CM | POA: Diagnosis not present

## 2014-07-16 DIAGNOSIS — M25562 Pain in left knee: Secondary | ICD-10-CM | POA: Diagnosis not present

## 2014-07-16 DIAGNOSIS — M25662 Stiffness of left knee, not elsewhere classified: Secondary | ICD-10-CM | POA: Diagnosis not present

## 2014-07-17 DIAGNOSIS — Z96652 Presence of left artificial knee joint: Secondary | ICD-10-CM | POA: Diagnosis not present

## 2014-07-17 DIAGNOSIS — M25562 Pain in left knee: Secondary | ICD-10-CM | POA: Diagnosis not present

## 2014-07-22 DIAGNOSIS — H35033 Hypertensive retinopathy, bilateral: Secondary | ICD-10-CM | POA: Diagnosis not present

## 2014-07-22 DIAGNOSIS — R262 Difficulty in walking, not elsewhere classified: Secondary | ICD-10-CM | POA: Diagnosis not present

## 2014-07-22 DIAGNOSIS — H43813 Vitreous degeneration, bilateral: Secondary | ICD-10-CM | POA: Diagnosis not present

## 2014-07-22 DIAGNOSIS — M25562 Pain in left knee: Secondary | ICD-10-CM | POA: Diagnosis not present

## 2014-07-22 DIAGNOSIS — Z96652 Presence of left artificial knee joint: Secondary | ICD-10-CM | POA: Diagnosis not present

## 2014-07-22 DIAGNOSIS — Z961 Presence of intraocular lens: Secondary | ICD-10-CM | POA: Diagnosis not present

## 2014-07-22 DIAGNOSIS — H3531 Nonexudative age-related macular degeneration: Secondary | ICD-10-CM | POA: Diagnosis not present

## 2014-07-22 DIAGNOSIS — M25662 Stiffness of left knee, not elsewhere classified: Secondary | ICD-10-CM | POA: Diagnosis not present

## 2014-07-22 DIAGNOSIS — H43393 Other vitreous opacities, bilateral: Secondary | ICD-10-CM | POA: Diagnosis not present

## 2014-07-24 DIAGNOSIS — M25662 Stiffness of left knee, not elsewhere classified: Secondary | ICD-10-CM | POA: Diagnosis not present

## 2014-07-24 DIAGNOSIS — Z96652 Presence of left artificial knee joint: Secondary | ICD-10-CM | POA: Diagnosis not present

## 2014-07-24 DIAGNOSIS — R262 Difficulty in walking, not elsewhere classified: Secondary | ICD-10-CM | POA: Diagnosis not present

## 2014-07-24 DIAGNOSIS — M25562 Pain in left knee: Secondary | ICD-10-CM | POA: Diagnosis not present

## 2014-07-29 DIAGNOSIS — M25662 Stiffness of left knee, not elsewhere classified: Secondary | ICD-10-CM | POA: Diagnosis not present

## 2014-07-29 DIAGNOSIS — R262 Difficulty in walking, not elsewhere classified: Secondary | ICD-10-CM | POA: Diagnosis not present

## 2014-07-29 DIAGNOSIS — Z96652 Presence of left artificial knee joint: Secondary | ICD-10-CM | POA: Diagnosis not present

## 2014-07-29 DIAGNOSIS — M25562 Pain in left knee: Secondary | ICD-10-CM | POA: Diagnosis not present

## 2014-07-31 DIAGNOSIS — M25562 Pain in left knee: Secondary | ICD-10-CM | POA: Diagnosis not present

## 2014-07-31 DIAGNOSIS — M25662 Stiffness of left knee, not elsewhere classified: Secondary | ICD-10-CM | POA: Diagnosis not present

## 2014-07-31 DIAGNOSIS — R262 Difficulty in walking, not elsewhere classified: Secondary | ICD-10-CM | POA: Diagnosis not present

## 2014-07-31 DIAGNOSIS — Z96652 Presence of left artificial knee joint: Secondary | ICD-10-CM | POA: Diagnosis not present

## 2014-08-05 DIAGNOSIS — Z96652 Presence of left artificial knee joint: Secondary | ICD-10-CM | POA: Diagnosis not present

## 2014-08-05 DIAGNOSIS — R262 Difficulty in walking, not elsewhere classified: Secondary | ICD-10-CM | POA: Diagnosis not present

## 2014-08-05 DIAGNOSIS — M25662 Stiffness of left knee, not elsewhere classified: Secondary | ICD-10-CM | POA: Diagnosis not present

## 2014-08-05 DIAGNOSIS — M25562 Pain in left knee: Secondary | ICD-10-CM | POA: Diagnosis not present

## 2014-08-13 DIAGNOSIS — M25562 Pain in left knee: Secondary | ICD-10-CM | POA: Diagnosis not present

## 2014-08-13 DIAGNOSIS — R262 Difficulty in walking, not elsewhere classified: Secondary | ICD-10-CM | POA: Diagnosis not present

## 2014-08-13 DIAGNOSIS — M25662 Stiffness of left knee, not elsewhere classified: Secondary | ICD-10-CM | POA: Diagnosis not present

## 2014-08-13 DIAGNOSIS — Z96652 Presence of left artificial knee joint: Secondary | ICD-10-CM | POA: Diagnosis not present

## 2014-08-22 DIAGNOSIS — R262 Difficulty in walking, not elsewhere classified: Secondary | ICD-10-CM | POA: Diagnosis not present

## 2014-08-22 DIAGNOSIS — Z96652 Presence of left artificial knee joint: Secondary | ICD-10-CM | POA: Diagnosis not present

## 2014-08-22 DIAGNOSIS — M25662 Stiffness of left knee, not elsewhere classified: Secondary | ICD-10-CM | POA: Diagnosis not present

## 2014-08-22 DIAGNOSIS — M25562 Pain in left knee: Secondary | ICD-10-CM | POA: Diagnosis not present

## 2014-08-26 DIAGNOSIS — R262 Difficulty in walking, not elsewhere classified: Secondary | ICD-10-CM | POA: Diagnosis not present

## 2014-08-26 DIAGNOSIS — M25662 Stiffness of left knee, not elsewhere classified: Secondary | ICD-10-CM | POA: Diagnosis not present

## 2014-08-26 DIAGNOSIS — Z96652 Presence of left artificial knee joint: Secondary | ICD-10-CM | POA: Diagnosis not present

## 2014-08-26 DIAGNOSIS — M25562 Pain in left knee: Secondary | ICD-10-CM | POA: Diagnosis not present

## 2014-08-28 DIAGNOSIS — M25662 Stiffness of left knee, not elsewhere classified: Secondary | ICD-10-CM | POA: Diagnosis not present

## 2014-08-28 DIAGNOSIS — M25562 Pain in left knee: Secondary | ICD-10-CM | POA: Diagnosis not present

## 2014-08-28 DIAGNOSIS — Z96652 Presence of left artificial knee joint: Secondary | ICD-10-CM | POA: Diagnosis not present

## 2014-08-28 DIAGNOSIS — R262 Difficulty in walking, not elsewhere classified: Secondary | ICD-10-CM | POA: Diagnosis not present

## 2014-09-01 DIAGNOSIS — M25562 Pain in left knee: Secondary | ICD-10-CM | POA: Diagnosis not present

## 2014-09-01 DIAGNOSIS — M25662 Stiffness of left knee, not elsewhere classified: Secondary | ICD-10-CM | POA: Diagnosis not present

## 2014-09-01 DIAGNOSIS — R262 Difficulty in walking, not elsewhere classified: Secondary | ICD-10-CM | POA: Diagnosis not present

## 2014-09-01 DIAGNOSIS — Z96652 Presence of left artificial knee joint: Secondary | ICD-10-CM | POA: Diagnosis not present

## 2014-09-09 ENCOUNTER — Telehealth: Payer: Self-pay | Admitting: Internal Medicine

## 2014-09-09 DIAGNOSIS — Z96652 Presence of left artificial knee joint: Secondary | ICD-10-CM | POA: Diagnosis not present

## 2014-09-09 DIAGNOSIS — M25562 Pain in left knee: Secondary | ICD-10-CM | POA: Diagnosis not present

## 2014-09-09 DIAGNOSIS — M25662 Stiffness of left knee, not elsewhere classified: Secondary | ICD-10-CM | POA: Diagnosis not present

## 2014-09-09 DIAGNOSIS — R262 Difficulty in walking, not elsewhere classified: Secondary | ICD-10-CM | POA: Diagnosis not present

## 2014-09-09 MED ORDER — METRONIDAZOLE 500 MG PO TABS: 500.0000 mg | ORAL_TABLET | Freq: Two times a day (BID) | ORAL | Status: DC

## 2014-09-09 MED ORDER — CIPROFLOXACIN HCL 500 MG PO TABS: 500.0000 mg | ORAL_TABLET | Freq: Two times a day (BID) | ORAL | Status: DC

## 2014-09-09 NOTE — Telephone Encounter (Signed)
Spoke with pt and he is aware, scripts sent to pharmacy. 

## 2014-09-09 NOTE — Telephone Encounter (Signed)
Cipro 500 mg twice daily and Flagyl 500 mg twice daily each for 10 days. Contact the office if his problems worsen for his upcoming office appointment

## 2014-09-09 NOTE — Telephone Encounter (Signed)
Pt states he is having pain in the LLQ of his abdomen again. Pt has OV scheduled with Dr. Henrene Pastor for 09/25/14 but wants to know if he can have some meds for the flare. States he usually takes cipro and flagyl. He does not have a fever. Please advise.

## 2014-09-11 DIAGNOSIS — R262 Difficulty in walking, not elsewhere classified: Secondary | ICD-10-CM | POA: Diagnosis not present

## 2014-09-11 DIAGNOSIS — M25562 Pain in left knee: Secondary | ICD-10-CM | POA: Diagnosis not present

## 2014-09-11 DIAGNOSIS — M25662 Stiffness of left knee, not elsewhere classified: Secondary | ICD-10-CM | POA: Diagnosis not present

## 2014-09-11 DIAGNOSIS — Z96652 Presence of left artificial knee joint: Secondary | ICD-10-CM | POA: Diagnosis not present

## 2014-09-12 ENCOUNTER — Encounter (HOSPITAL_COMMUNITY): Payer: Self-pay | Admitting: Orthopaedic Surgery

## 2014-09-25 ENCOUNTER — Ambulatory Visit (INDEPENDENT_AMBULATORY_CARE_PROVIDER_SITE_OTHER): Payer: Medicare Other | Admitting: Internal Medicine

## 2014-09-25 ENCOUNTER — Encounter: Payer: Self-pay | Admitting: Internal Medicine

## 2014-09-25 ENCOUNTER — Ambulatory Visit: Payer: 59 | Admitting: Internal Medicine

## 2014-09-25 VITALS — BP 128/60 | HR 64 | Ht 67.0 in | Wt 191.1 lb

## 2014-09-25 DIAGNOSIS — R1032 Left lower quadrant pain: Secondary | ICD-10-CM

## 2014-09-25 DIAGNOSIS — R1013 Epigastric pain: Secondary | ICD-10-CM | POA: Diagnosis not present

## 2014-09-25 DIAGNOSIS — K5732 Diverticulitis of large intestine without perforation or abscess without bleeding: Secondary | ICD-10-CM | POA: Diagnosis not present

## 2014-09-25 DIAGNOSIS — Z86010 Personal history of colonic polyps: Secondary | ICD-10-CM

## 2014-09-25 DIAGNOSIS — K2100 Gastro-esophageal reflux disease with esophagitis, without bleeding: Secondary | ICD-10-CM

## 2014-09-25 DIAGNOSIS — K5901 Slow transit constipation: Secondary | ICD-10-CM

## 2014-09-25 MED ORDER — PANTOPRAZOLE SODIUM 20 MG PO TBEC: 40.0000 mg | DELAYED_RELEASE_TABLET | Freq: Every day | ORAL | Status: DC

## 2014-09-25 MED ORDER — METRONIDAZOLE 500 MG PO TABS: 500.0000 mg | ORAL_TABLET | Freq: Two times a day (BID) | ORAL | Status: DC

## 2014-09-25 MED ORDER — CIPROFLOXACIN HCL 500 MG PO TABS: 500.0000 mg | ORAL_TABLET | Freq: Two times a day (BID) | ORAL | Status: DC

## 2014-09-25 NOTE — Patient Instructions (Signed)
We have sent the following medications to your pharmacy for you to pick up at your convenience:  Pantoprazole (take daily for one month) Cipro, Flagyl  Continue to keep your bowels regular.  I will call you to schedule your colonoscopy

## 2014-09-25 NOTE — Progress Notes (Signed)
HISTORY OF PRESENT ILLNESS:  Carl [name] is a 69 y.o. male with history of recurrent diverticulitis, adenomatous colon polyps, GERD with esophagitis, gastric ulcers, and general medical problems as listed below. I last saw the patient 06/09/2014 after resolution of acute diverticulitis. Patient also has functional constipation. His last colonoscopy was September 2011. He is due for follow-up surveillance this fall. Since his last visit he has had successful knee surgery. He presents with a chief complaint today of intermittent left lower quadrant pain of several months duration and epigastric pain relieved with meals. First, he describes several month history of intermittent left lower quadrant pain. Describes the pain as sharp last approximate 30 seconds with spontaneous resolution. This may occur 2 or 3 times per day. He has noticed that if he takes magnesium supplement his constipation is improved. With improved bowel movements there is less pain. He has not had the sustained and severe focal pain that he associates with diverticulitis. For this intermittent discomfort he had been using NSAIDs. With NSAIDs he did notice epigastric pain improved with meals. He has a prescription for pantoprazole but has not been using.  REVIEW OF SYSTEMS:  All non-GI ROS negative except for arthritis  Past Medical History  Diagnosis Date  . Diverticulitis   . Diverticulosis   . Esophagitis   . Gastric ulcer   . Hx of adenomatous colonic polyps     benign  . Hyperlipidemia     takes Zocor daily  . Anxiety     takes Xanax daily as needed  . Insomnia     takes Ambien nightly  . GERD (gastroesophageal reflux disease)     takes Protonix daily  . Depression     takes Celexa daily  . Arthritis   . Joint pain   . Joint swelling   . Enlarged prostate     sightly    Past Surgical History  Procedure Laterality Date  . Nasal sinus surgery    . Knee surgery Left     couple of times  . Cervical spine  surgery    . Tonsillectomy    . Foreign body removal esophageal    . Cartliage removed from nose  91yrs ago  . Cataract surgery Bilateral   . Colonoscopy    . 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 He has never used smokeless tobacco. He reports that he drinks alcohol. He reports that he does not use illicit drugs.  family history includes Colon cancer in his paternal grandfather.  No Known Allergies     PHYSICAL EXAMINATION: Vital signs: BP 128/60 mmHg  Pulse 64  Ht 5\' 7"  (1.702 m)  Wt 191 lb 2 oz (86.694 kg)  BMI 29.93 kg/m2 General: Well-developed, well-nourished, no acute distress HEENT: Sclerae are anicteric, conjunctiva pink. Oral mucosa intact Lungs: Clear to auscultation and percussion Heart: Regular without murmur Abdomen: soft, very mild tenderness with deep palpation in left lower quadrant, nondistended, no obvious ascites, no peritoneal signs, normal bowel sounds. No organomegaly. Extremities: No clubbing cyanosis or edema Skin: No visible lesions Psychiatric: alert and oriented x3. Cooperative Neuro: No focal deficits   ASSESSMENT:  #1. Intermittent short lived left lower quadrant pain as described. Most likely related to diverticular spasm and/or relative constipation associated with diverticular narrowing in  the left colon #2. History of acute diverticulitis on multiple occasions requiring medical therapy. Radiographic documentation to support. Not felt to have diverticulitis at this time #3. History of GERD #4. History of ulcers #5. Recent problems with epigastric discomfort improved with meals likely due to NSAID related ulcer or gastritis #6. Chronic constipation. Ongoing. Improved with magnesium supplement #7. History of adenomatous colon polyps due for  surveillance   PLAN:  #1. Told to keep bowels regular with magnesium supplement and fiber #2. Prescribed ciprofloxacin 500 mg twice a day and metronidazole 500 mg twice a day, each 1 week should he develop acute diverticulitis and not have access to medical care. He has been given 1 refill. He is also been told to contact the office should he decide to start these medications for medical documentation and further recommendations. He understands and agrees #3. Resume pantoprazole 40 mg daily #4. Recommend Tylenol and recommended dosages for pain as opposed NSAIDs #5. Schedule surveillance colonoscopy this fall.The nature of the procedure, as well as the risks, benefits, and alternatives were carefully and thoroughly reviewed with the patient. Ample time for discussion and questions allowed. The patient understood, was satisfied, and agreed to proceed.

## 2014-10-22 DIAGNOSIS — H40013 Open angle with borderline findings, low risk, bilateral: Secondary | ICD-10-CM | POA: Diagnosis not present

## 2014-11-19 ENCOUNTER — Other Ambulatory Visit: Payer: Self-pay | Admitting: Emergency Medicine

## 2014-12-12 ENCOUNTER — Encounter: Payer: Self-pay | Admitting: Internal Medicine

## 2014-12-23 ENCOUNTER — Ambulatory Visit (AMBULATORY_SURGERY_CENTER): Payer: Self-pay

## 2014-12-23 VITALS — Ht 68.75 in | Wt 194.8 lb

## 2014-12-23 DIAGNOSIS — Z860100 Personal history of colon polyps, unspecified: Secondary | ICD-10-CM

## 2014-12-23 MED ORDER — SUPREP BOWEL PREP KIT 17.5-3.13-1.6 GM/177ML PO SOLN: 1.0000 | Freq: Once | ORAL | Status: DC

## 2014-12-23 NOTE — Progress Notes (Signed)
No allergies to eggs or soy No past problems with anesthesia No home oxygen No diet/weight loss meds  Has email and internet; refused emmi 

## 2015-01-06 ENCOUNTER — Other Ambulatory Visit: Payer: Self-pay | Admitting: Internal Medicine

## 2015-01-07 ENCOUNTER — Telehealth: Payer: Self-pay

## 2015-01-07 NOTE — Telephone Encounter (Signed)
Spoke with pt and he states he had prescriptions for cipro and flagyl to have on hand in case he needed them. States he had to fill them a couple of weeks ago for diverticulitis and he was supposed to have 2 refills on each script. States he has 2 on the flagyl but something happened with the pharmacy and he doesn't have any refills on cipro. Pt wants 2 refills on cipro to have on hand along with the flagyl that he already has. Please advise.

## 2015-01-07 NOTE — Telephone Encounter (Signed)
-----   Message from Irene Shipper, MD sent at 01/07/2015  4:07 PM EST ----- Carl [name], Please contact Carl [name] and find out why he is requesting antibiotics. Please documented the clinical history. Convert to phone note. If he feels he is having a bout of diverticulitis, okay to refill. Keep office visit in December as noted below. thanks ----- Message -----    From: Audrea Muscat, CMA    Sent: 01/07/2015   3:37 PM      To: Irene Shipper, MD  Patient requesting refill of Cipro.  Given Cipro and Flagyl in August to be used if he developed diverticulitis symptoms.  He has an office visit scheduled for December.  Ok to refill?

## 2015-01-07 NOTE — Telephone Encounter (Signed)
Are you ok to refill the cipro X2?

## 2015-01-07 NOTE — Telephone Encounter (Signed)
Yes

## 2015-01-07 NOTE — Telephone Encounter (Signed)
OK. Thanks for clarification

## 2015-01-08 MED ORDER — CIPROFLOXACIN HCL 500 MG PO TABS: 500.0000 mg | ORAL_TABLET | Freq: Two times a day (BID) | ORAL | Status: DC

## 2015-01-08 NOTE — Telephone Encounter (Signed)
Script sent to pharmacy.

## 2015-01-22 ENCOUNTER — Ambulatory Visit (AMBULATORY_SURGERY_CENTER): Payer: Medicare Other | Admitting: Internal Medicine

## 2015-01-22 ENCOUNTER — Encounter: Payer: Self-pay | Admitting: Internal Medicine

## 2015-01-22 VITALS — BP 131/82 | HR 68 | Temp 95.5°F | Resp 14 | Ht 68.0 in | Wt 194.0 lb

## 2015-01-22 DIAGNOSIS — D122 Benign neoplasm of ascending colon: Secondary | ICD-10-CM

## 2015-01-22 DIAGNOSIS — Z86010 Personal history of colonic polyps: Secondary | ICD-10-CM

## 2015-01-22 DIAGNOSIS — D123 Benign neoplasm of transverse colon: Secondary | ICD-10-CM

## 2015-01-22 DIAGNOSIS — K219 Gastro-esophageal reflux disease without esophagitis: Secondary | ICD-10-CM | POA: Diagnosis not present

## 2015-01-22 DIAGNOSIS — F341 Dysthymic disorder: Secondary | ICD-10-CM | POA: Diagnosis not present

## 2015-01-22 DIAGNOSIS — E669 Obesity, unspecified: Secondary | ICD-10-CM | POA: Diagnosis not present

## 2015-01-22 MED ORDER — SODIUM CHLORIDE 0.9 % IV SOLN: 500.0000 mL | INTRAVENOUS | Status: DC

## 2015-01-22 NOTE — Progress Notes (Signed)
Report to PACU, RN, vss, BBS= Clear.  

## 2015-01-22 NOTE — Patient Instructions (Signed)
Impressions/recommendations:  Polyps (handout given) Diverticulosis (handout given) High fiber diet (handout given)  Repeat colonoscopy in 5 years.  YOU HAD AN ENDOSCOPIC PROCEDURE TODAY AT THE [facility]:   Refer to the procedure report that was given to you for any specific questions about what was found during the examination.  If the procedure report does not answer your questions, please call your gastroenterologist to clarify.  If you requested that your care partner not be given the details of your procedure findings, then the procedure report has been included in a sealed envelope for you to review at your convenience later.  YOU SHOULD EXPECT: Some feelings of bloating in the abdomen. Passage of more gas than usual.  Walking can help get rid of the air that was put into your GI tract during the procedure and reduce the bloating. If you had a lower endoscopy (such as a colonoscopy or flexible sigmoidoscopy) you may notice spotting of blood in your stool or on the toilet paper. If you underwent a bowel prep for your procedure, you may not have a normal bowel movement for a few days.  Please Note:  You might notice some irritation and congestion in your nose or some drainage.  This is from the oxygen used during your procedure.  There is no need for concern and it should clear up in a day or so.  SYMPTOMS TO REPORT IMMEDIATELY:   Following lower endoscopy (colonoscopy or flexible sigmoidoscopy):  Excessive amounts of blood in the stool  Significant tenderness or worsening of abdominal pains  Swelling of the abdomen that is new, acute  Fever of 100F or higher   For urgent or emergent issues, a gastroenterologist can be reached at any hour by calling [phone].   DIET: Your first meal following the procedure should be a small meal and then it is ok to progress to your normal diet. Heavy or fried foods are harder to digest and may make you feel nauseous or bloated.   Likewise, meals heavy in dairy and vegetables can increase bloating.  Drink plenty of fluids but you should avoid alcoholic beverages for 24 hours.  ACTIVITY:  You should plan to take it easy for the rest of today and you should NOT DRIVE or use heavy machinery until tomorrow (because of the sedation medicines used during the test).    FOLLOW UP: Our staff will call the number listed on your records the next business day following your procedure to check on you and address any questions or concerns that you may have regarding the information given to you following your procedure. If we do not reach you, we will leave a message.  However, if you are feeling well and you are not experiencing any problems, there is no need to return our call.  We will assume that you have returned to your regular daily activities without incident.  If any biopsies were taken you will be contacted by phone or by letter within the next 1-3 weeks.  Please call us at [phone] if you have not heard about the biopsies in 3 weeks.    SIGNATURES/CONFIDENTIALITY: You and/or your care partner have signed paperwork which will be entered into your electronic medical record.  These signatures attest to the fact that that the information above on your After Visit Summary has been reviewed and is understood.  Full responsibility of the confidentiality of this discharge information lies with you and/or your care-partner. 

## 2015-01-22 NOTE — Op Note (Signed)
[facility]  [address] [place], [id number]   COLONOSCOPY PROCEDURE REPORT  PATIENT: Carl [name], Carl [name]  MR#: [id number] BIRTHDATE: 1945/11/18 , 53  yrs. old GENDER: male ENDOSCOPIST: Eustace Quail, MD REFERRED [id number] Program Recall PROCEDURE DATE:  01/22/2015 PROCEDURE:   Colonoscopy, surveillance and Colonoscopy with snare polypectomy x 2 First Screening Colonoscopy - Avg.  risk and is 50 yrs.  old or older - No.  Prior Negative Screening - Now for repeat screening. N/A  History of Adenoma - Now for follow-up colonoscopy & has been > or = to 3 yrs.  Yes hx of adenoma.  Has been 3 or more years since last colonoscopy.  Polyps removed today? Yes ASA CLASS:   Class II INDICATIONS:Surveillance due to prior colonic neoplasia and PH Colon Adenoma.. Prior examinations 1997, 2000, 2004, 2008, in 2011 with multiple tubular adenomas. MEDICATIONS: Monitored anesthesia care and Propofol 300 mg IV  DESCRIPTION OF PROCEDURE:   After the risks benefits and alternatives of the procedure were thoroughly explained, informed consent was obtained.  The digital rectal exam revealed no abnormalities of the rectum.   The LB [id number]-[id number]  endoscope was introduced through the anus and advanced to the cecum, which was identified by both the appendix and ileocecal valve. No adverse events experienced.   The quality of the prep was excellent. (Suprep was used)  The instrument was then slowly withdrawn as the colon was fully examined. Estimated blood loss is zero unless otherwise noted in this procedure report.  COLON FINDINGS: Two polyps measuring 5 mm in size were found in the ascending colon and transverse colon.  A polypectomy was performed with a cold snare.  The resection was complete, the polyp tissue was completely retrieved and sent to histology.   There was severe diverticulosis in the left colon.   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   The  examination was otherwise normal. Retroflexed views revealed internal hemorrhoids. The time to cecum = 2.5 Withdrawal time = 13.9   The scope was withdrawn and the procedure completed. COMPLICATIONS: There were no immediate complications.  ENDOSCOPIC IMPRESSION: 1.   Two polyps were found in the ascending colon and transverse colon; polypectomy was performed with a cold snare 2.   Severe diverticulosis was noted in the left colon 3.   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   The examination was otherwise normal  RECOMMENDATIONS: 1. Follow up colonoscopy in 5 years  eSigned:  Eustace Quail, MD 01/22/2015 1:52 PM   cc: The Patient and Hulan Fess, MD

## 2015-01-22 NOTE — Progress Notes (Signed)
Called to room to assist during endoscopic procedure.  Patient ID and intended procedure confirmed with present staff. Received instructions for my participation in the procedure from the performing physician.  

## 2015-01-23 ENCOUNTER — Telehealth: Payer: Self-pay | Admitting: *Deleted

## 2015-01-23 NOTE — Telephone Encounter (Signed)
  Follow up Call-  Call back number 01/22/2015  Post procedure Call Back phone  # [phone]  Permission to leave phone message Yes     Patient questions:  Do you have a fever, pain , or abdominal swelling? No. Pain Score  0 *  Have you tolerated food without any problems? Yes.    Have you been able to return to your normal activities? Yes.    Do you have any questions about your discharge instructions: Diet   No. Medications  No. Follow up visit  No.  Do you have questions or concerns about your Care? No.  Actions: * If pain score is 4 or above: No action needed, pain <4.

## 2015-01-27 ENCOUNTER — Encounter: Payer: Self-pay | Admitting: Internal Medicine

## 2015-02-11 DIAGNOSIS — E785 Hyperlipidemia, unspecified: Secondary | ICD-10-CM | POA: Diagnosis not present

## 2015-02-11 DIAGNOSIS — G479 Sleep disorder, unspecified: Secondary | ICD-10-CM | POA: Diagnosis not present

## 2015-02-11 DIAGNOSIS — Z86010 Personal history of colonic polyps: Secondary | ICD-10-CM | POA: Diagnosis not present

## 2015-02-11 DIAGNOSIS — Z79899 Other long term (current) drug therapy: Secondary | ICD-10-CM | POA: Diagnosis not present

## 2015-02-11 DIAGNOSIS — G459 Transient cerebral ischemic attack, unspecified: Secondary | ICD-10-CM | POA: Diagnosis not present

## 2015-02-11 DIAGNOSIS — Z125 Encounter for screening for malignant neoplasm of prostate: Secondary | ICD-10-CM | POA: Diagnosis not present

## 2015-02-11 DIAGNOSIS — F419 Anxiety disorder, unspecified: Secondary | ICD-10-CM | POA: Diagnosis not present

## 2015-02-25 DIAGNOSIS — Z125 Encounter for screening for malignant neoplasm of prostate: Secondary | ICD-10-CM | POA: Diagnosis not present

## 2015-02-25 DIAGNOSIS — Z86010 Personal history of colonic polyps: Secondary | ICD-10-CM | POA: Diagnosis not present

## 2015-02-25 DIAGNOSIS — G459 Transient cerebral ischemic attack, unspecified: Secondary | ICD-10-CM | POA: Diagnosis not present

## 2015-02-25 DIAGNOSIS — Z79899 Other long term (current) drug therapy: Secondary | ICD-10-CM | POA: Diagnosis not present

## 2015-02-25 DIAGNOSIS — E785 Hyperlipidemia, unspecified: Secondary | ICD-10-CM | POA: Diagnosis not present

## 2015-02-25 DIAGNOSIS — G479 Sleep disorder, unspecified: Secondary | ICD-10-CM | POA: Diagnosis not present

## 2015-02-25 DIAGNOSIS — F419 Anxiety disorder, unspecified: Secondary | ICD-10-CM | POA: Diagnosis not present

## 2015-05-06 DIAGNOSIS — L570 Actinic keratosis: Secondary | ICD-10-CM | POA: Diagnosis not present

## 2015-05-27 DIAGNOSIS — R7301 Impaired fasting glucose: Secondary | ICD-10-CM | POA: Diagnosis not present

## 2015-05-27 DIAGNOSIS — E785 Hyperlipidemia, unspecified: Secondary | ICD-10-CM | POA: Diagnosis not present

## 2015-05-29 DIAGNOSIS — L570 Actinic keratosis: Secondary | ICD-10-CM | POA: Diagnosis not present

## 2015-07-07 ENCOUNTER — Ambulatory Visit (INDEPENDENT_AMBULATORY_CARE_PROVIDER_SITE_OTHER): Payer: Medicare Other | Admitting: Gastroenterology

## 2015-07-07 ENCOUNTER — Telehealth: Payer: Self-pay | Admitting: Internal Medicine

## 2015-07-07 ENCOUNTER — Encounter: Payer: Self-pay | Admitting: Gastroenterology

## 2015-07-07 ENCOUNTER — Other Ambulatory Visit (INDEPENDENT_AMBULATORY_CARE_PROVIDER_SITE_OTHER): Payer: Medicare Other

## 2015-07-07 VITALS — BP 118/70 | HR 72 | Wt 191.5 lb

## 2015-07-07 DIAGNOSIS — R1031 Right lower quadrant pain: Secondary | ICD-10-CM

## 2015-07-07 DIAGNOSIS — R1033 Periumbilical pain: Secondary | ICD-10-CM

## 2015-07-07 LAB — COMPREHENSIVE METABOLIC PANEL
ALBUMIN: 4.5 g/dL (ref 3.5–5.2)
ALT: 25 U/L (ref 0–53)
AST: 24 U/L (ref 0–37)
Alkaline Phosphatase: 75 U/L (ref 39–117)
BUN: 16 mg/dL (ref 6–23)
CO2: 29 meq/L (ref 19–32)
CREATININE: 0.91 mg/dL (ref 0.40–1.50)
Calcium: 9.6 mg/dL (ref 8.4–10.5)
Chloride: 101 mEq/L (ref 96–112)
GFR: 87.28 mL/min (ref >60.00)
Glucose, Bld: 98 mg/dL (ref 70–99)
Potassium: 4.4 mEq/L (ref 3.5–5.1)
SODIUM: 137 meq/L (ref 135–145)
Total Bilirubin: 0.6 mg/dL (ref 0.2–1.2)
Total Protein: 7.4 g/dL (ref 6.0–8.3)

## 2015-07-07 LAB — CBC WITH DIFFERENTIAL/PLATELET
BASOS PCT: 0.7 % (ref 0.0–3.0)
Basophils Absolute: 0.1 10*3/uL (ref 0.0–0.1)
EOS ABS: 0.2 10*3/uL (ref 0.0–0.7)
Eosinophils Relative: 1.2 % (ref 0.0–5.0)
HCT: 45.7 % (ref 39.0–52.0)
HEMOGLOBIN: 15.6 g/dL (ref 13.0–17.0)
LYMPHS ABS: 1.5 10*3/uL (ref 0.7–4.0)
Lymphocytes Relative: 11.1 % — ABNORMAL LOW (ref 12.0–46.0)
MCHC: 34.2 g/dL (ref 30.0–36.0)
MCV: 95.3 fl (ref 78.0–100.0)
MONO ABS: 1.5 10*3/uL — AB (ref 0.1–1.0)
Monocytes Relative: 10.9 % (ref 3.0–12.0)
Neutro Abs: 10.5 10*3/uL — ABNORMAL HIGH (ref 1.4–7.7)
Neutrophils Relative %: 76.1 % (ref 43.0–77.0)
Platelets: 256 10*3/uL (ref 150.0–400.0)
RBC: 4.79 Mil/uL (ref 4.22–5.81)
RDW: 13.4 % (ref 11.5–15.5)
WBC: 13.9 10*3/uL — AB (ref 4.0–10.5)

## 2015-07-07 MED ORDER — PANTOPRAZOLE SODIUM 20 MG PO TBEC: 40.0000 mg | DELAYED_RELEASE_TABLET | Freq: Every day | ORAL | Status: DC

## 2015-07-07 MED ORDER — METRONIDAZOLE 500 MG PO TABS: 500.0000 mg | ORAL_TABLET | Freq: Two times a day (BID) | ORAL | Status: DC

## 2015-07-07 MED ORDER — CIPROFLOXACIN HCL 500 MG PO TABS: 500.0000 mg | ORAL_TABLET | Freq: Two times a day (BID) | ORAL | Status: DC

## 2015-07-07 NOTE — Progress Notes (Signed)
     07/07/2015 Carl [name] [id number] 24-Dec-1945   History of Present Illness:  This is a pleasant 70 year old male well-known to Dr. Henrene Pastor for treatment of recurrent diverticulitis. He presents to our office today as an urgent add-on for complaints of periumbilical to right lower quadrant abdominal pain, which is not typical for his usual diverticulitis. His diverticulitis is mostly left-sided as confirmed on recent colonoscopy in December 2016. Most of his pain from his diverticulitis has been in the left lower quadrant. He says that the pain that he has currently has been present for the past 2 or 3 weeks. He has been taking Tylenol several times a day with mild effect. He did have some Cipro and Flagyl at home which he tried without much resolution. He says that the pain seems to get better when he eats or when he is able to belch. It does hurt when he moves or bends over, etc.  Denies nausea, vomiting, fever, chills.  Is moving his bowels well.  Current Medications, Allergies, Past Medical History, Past Surgical History, Family History and Social History were reviewed in [employer] record.   Physical Exam: BP 118/70 mmHg  Pulse 72  Wt 191 lb 8 oz (86.864 kg) General: Well developed white male in no acute distress Head: Normocephalic and atraumatic Eyes:  Sclerae anicteric, conjunctiva pink  Ears: Normal auditory acuity Lungs: Clear throughout to auscultation Heart: Regular rate and rhythm Abdomen: Soft, non-distended.  Normal bowel sounds.  Mild RLQ and peri-umbilical TTP without R/R/G. Musculoskeletal: Symmetrical with no gross deformities  Extremities: No edema  Neurological: Alert oriented x 4, grossly non-focal Psychological:  Alert and cooperative. Normal mood and affect  Assessment and Recommendations: -70 year old male with history of recurrent diverticulitis but presents today with periumbilical and right lower quadrant abdominal pain, not typical for  his usual episodes. We will check labs today including a CBC and CMP. We'll schedule for CT scan of abdomen and pelvis with contrast this week to rule out diverticulitis, less likely appendicitis, etc. He also asked for new prescriptions for his Cipro and Flagyl to have on hand, which he typically does at home for when he has diverticulitis episodes occur.  Discussed with Dr. Henrene Pastor.

## 2015-07-07 NOTE — Telephone Encounter (Signed)
Patient states he has a hx of diverticulitis. He has been taking his medications for 2 weeks for abdominal pain near the belly button. Still has pain. Denies fever. Scheduled with Alonza Bogus, PA today at 3:00 PM.

## 2015-07-07 NOTE — Progress Notes (Signed)
Agree, as outlined.

## 2015-07-07 NOTE — Patient Instructions (Addendum)
Please go to the basement level to have your labs drawn.  We have given you printed prescriptions for the Ciprofloxin 500 mg and the Metronidazole 500 mg.    You have been scheduled for a CT scan of the abdomen and pelvis at [facility] ([address]---this is in the same building as [facility]).   You are scheduled on Wednesday at 07-10-2015. You should arrive at 3:30 PM  to your appointment time of 3:45 PM for registration. Please follow the written instructions below on the day of your exam:  WARNING: IF YOU ARE ALLERGIC TO IODINE/X-RAY DYE, PLEASE NOTIFY RADIOLOGY IMMEDIATELY AT [id number]! YOU WILL BE GIVEN A 13 HOUR PREMEDICATION PREP.  1) Do not eat or drink anything after 11:45 PM (4 hours prior to your test) 2) You have been given 2 bottles of oral contrast to drink. The solution may taste better if refrigerated, but do NOT add ice or any other liquid to this solution. Shake well before drinking.    Drink 1 bottle of contrast @ 1:45 PM (2 hours prior to your exam)  Drink 1 bottle of contrast @ 2:45 PM (1 hour prior to your exam)  You may take any medications as prescribed with a small amount of water except for the following: Metformin, Glucophage, Glucovance, Avandamet, Riomet, Fortamet, Actoplus Met, Janumet, Glumetza or Metaglip. The above medications must be held the day of the exam AND 48 hours after the exam.  The purpose of you drinking the oral contrast is to aid in the visualization of your intestinal tract. The contrast solution may cause some diarrhea. Before your exam is started, you will be given a small amount of fluid to drink. Depending on your individual set of symptoms, you may also receive an intravenous injection of x-ray contrast/dye. Plan on being at [facility] for 30 minutes or long, depending on the type of exam you are having performed.  If you have any questions regarding your exam or if you need to reschedule, you may call the CT  department at [phone] between the hours of 8:00 am and 5:00 pm, Monday-Friday.  ________________________________________________________________________

## 2015-07-08 ENCOUNTER — Ambulatory Visit (INDEPENDENT_AMBULATORY_CARE_PROVIDER_SITE_OTHER)
Admission: RE | Admit: 2015-07-08 | Discharge: 2015-07-08 | Disposition: A | Discharge status: Home / Self Care | Payer: Medicare Other | Source: Ambulatory Visit | Attending: Gastroenterology | Admitting: Gastroenterology

## 2015-07-08 DIAGNOSIS — R1031 Right lower quadrant pain: Secondary | ICD-10-CM

## 2015-07-08 DIAGNOSIS — R1033 Periumbilical pain: Secondary | ICD-10-CM

## 2015-07-08 MED ORDER — IOPAMIDOL (ISOVUE-300) INJECTION 61%
100.0000 mL | Freq: Once | INTRAVENOUS | Status: AC | PRN
  Administered 2015-07-08: 100 mL via INTRAVENOUS

## 2015-07-09 ENCOUNTER — Telehealth: Payer: Self-pay | Admitting: Gastroenterology

## 2015-07-09 NOTE — Telephone Encounter (Signed)
Requesting CT results. Please, advise.

## 2015-07-23 DIAGNOSIS — Z961 Presence of intraocular lens: Secondary | ICD-10-CM | POA: Diagnosis not present

## 2015-07-23 DIAGNOSIS — H353112 Nonexudative age-related macular degeneration, right eye, intermediate dry stage: Secondary | ICD-10-CM | POA: Diagnosis not present

## 2015-07-23 DIAGNOSIS — H40013 Open angle with borderline findings, low risk, bilateral: Secondary | ICD-10-CM | POA: Diagnosis not present

## 2015-07-23 DIAGNOSIS — H353122 Nonexudative age-related macular degeneration, left eye, intermediate dry stage: Secondary | ICD-10-CM | POA: Diagnosis not present

## 2015-08-03 ENCOUNTER — Encounter: Payer: Self-pay | Admitting: Internal Medicine

## 2015-08-03 ENCOUNTER — Ambulatory Visit (INDEPENDENT_AMBULATORY_CARE_PROVIDER_SITE_OTHER): Payer: Medicare Other | Admitting: Internal Medicine

## 2015-08-03 ENCOUNTER — Telehealth: Payer: Self-pay

## 2015-08-03 VITALS — BP 122/60 | HR 68 | Ht 67.0 in | Wt 190.0 lb

## 2015-08-03 DIAGNOSIS — R1084 Generalized abdominal pain: Secondary | ICD-10-CM

## 2015-08-03 DIAGNOSIS — K5732 Diverticulitis of large intestine without perforation or abscess without bleeding: Secondary | ICD-10-CM

## 2015-08-03 MED ORDER — CIPROFLOXACIN HCL 500 MG PO TABS: 500.0000 mg | ORAL_TABLET | Freq: Two times a day (BID) | ORAL | Status: DC

## 2015-08-03 MED ORDER — METRONIDAZOLE 500 MG PO TABS: 500.0000 mg | ORAL_TABLET | Freq: Two times a day (BID) | ORAL | Status: DC

## 2015-08-03 NOTE — Progress Notes (Signed)
HISTORY OF PRESENT ILLNESS:  Carl [name] is a 70 y.o. male with a history of recurrent diverticulitis, adenomatous colon polyps, GERD with esophagitis, and NSAID-related ulcers. Patient presents today for follow-up after being seen by the GI physician assistant [id number] with periumbilical and right lower quadrant abdominal pain, unlike prior bouts of diverticulitis, for which he was concerned about possible appendicitis. Contrast-enhanced CT scan of the abdomen and pelvis was performed the following day. The appendix was normal. However he was noted to have multifocal acute sigmoid diverticulitis without perforation or abscess. He was treated with ciprofloxacin and metronidazole. He tells me that his lower abdominal discomfort waxed and waned. Now reporting upper abdominal discomfort exacerbated by meals. He does take aspirin. He had been off PPI but resumed this proximal me 1 week ago. It seems that his symptoms have improved over the past 2 days. No nausea or vomiting. Regular bowel habits. Last colonoscopy December 2016. Last upper endoscopy October 2010.  REVIEW OF SYSTEMS:  All non-GI ROS negative except for hearing impairment  Past Medical History  Diagnosis Date  . Diverticulitis   . Diverticulosis   . Esophagitis   . Gastric ulcer   . Hx of adenomatous colonic polyps     benign  . Hyperlipidemia     takes Zocor daily  . Anxiety     takes Xanax daily as needed  . Insomnia     takes Ambien nightly  . GERD (gastroesophageal reflux disease)     takes Protonix daily  . Depression     takes Celexa daily  . Arthritis   . Joint pain   . Joint swelling   . Enlarged prostate     sightly    Past Surgical History  Procedure Laterality Date  . Nasal sinus surgery    . Knee surgery Left     couple of times  . Cervical spine surgery    . Tonsillectomy    . Foreign body removal esophageal    . Cartliage removed from nose  77yrs ago  . Cataract surgery Bilateral   . Colonoscopy     . 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 He has never used smokeless tobacco. He reports that he drinks alcohol. He reports that he does not use illicit drugs.  family history includes Colon cancer in his paternal grandfather.  No Known Allergies     PHYSICAL EXAMINATION: Vital signs: BP 122/60 mmHg  Pulse 68  Ht 5\' 7"  (1.702 m)  Wt 190 lb (86.183 kg)  BMI 29.75 kg/m2  Constitutional: generally well-appearing, no acute distress Psychiatric: alert and oriented x3, cooperative Eyes: extraocular movements intact, anicteric, conjunctiva pink Mouth: oral pharynx moist, no lesions Neck: supple no lymphadenopathy Cardiovascular: heart regular rate and rhythm, no murmur Lungs: clear to auscultation bilaterally Abdomen: soft, Mild epigastric tenderness to palpation, nondistended, no obvious ascites, no peritoneal signs, normal bowel sounds, no organomegaly Rectal:Omitted Extremities: no clubbing cyanosis or lower extremity edema bilaterally Skin: no lesions on visible extremities Neuro: No focal deficits. Cranial nerves intact. Normal DTRs  ASSESSMENT:  #1. Recent bout of multifocal diverticulitis. Persistent abdominal discomfort and new upper abdominal discomfort improved over the past few days only. Rule out persistent diverticulitis. Rule out ulcer disease. #2. GERD #3. History of NSAID-induced ulcers #4. History  of adenomatous colon polyps. Last colonoscopy December 2016   PLAN:  #1. Schedule upper endoscopy to rule out recurrent ulcer disease.The nature of the procedure, as well as the risks, benefits, and alternatives were carefully and thoroughly reviewed with the patient. Ample time for discussion and questions allowed. The patient understood, was satisfied, and agreed to  proceed. #2. Take PPI DAILY #3. Avoid unnecessary NSAIDs #4. Schedule repeat CT scan to follow-up diverticulitis. #5. Refill antibiotics to have on hand should he develop acute flare of diverticulitis. He would contact the office to let us know. #6. Surveillance colonoscopy 2021  40 minutes was spent face-to-face with the patient. Greater than 50% a time use for counseling regarding his diverticular disease and workup of his abdominal pain

## 2015-08-03 NOTE — Telephone Encounter (Signed)
-----   Message from Irene Shipper, MD sent at 08/03/2015  9:44 AM EDT ----- Regarding: Medication refill Please refill this patient's ciprofloxacin and metronidazole so he may have on hand should he develop a diverticular flare. Thanks

## 2015-08-03 NOTE — Telephone Encounter (Signed)
Scripts sent to pharmacy for pt.

## 2015-08-03 NOTE — Telephone Encounter (Signed)
Left message that Ct of abdomen and Pelvis scheduled for 08/06/2015 at 9:00am at [facility].  Patient knows to get there 15 minutes early.  He should drink one bottle of contrast at 7:00am and one bottle at 8:00am

## 2015-08-03 NOTE — Patient Instructions (Signed)
You have been scheduled for a CT scan of the abdomen and pelvis at [facility] ([address]---this is in the same building as [employer]).   You are scheduled on___  at ___. You should arrive 15 minutes prior to your appointment time for registration. Please follow the written instructions below on the day of your exam:  WARNING: IF YOU ARE ALLERGIC TO IODINE/X-RAY DYE, PLEASE NOTIFY RADIOLOGY IMMEDIATELY AT [phone]! YOU WILL BE GIVEN A 13 HOUR PREMEDICATION PREP.  1) Do not eat or drink anything after ___ (4 hours prior to your test) 2) You have been given 2 bottles of oral contrast to drink. The solution may taste better if refrigerated, but do NOT add ice or any other liquid to this solution. Shake well before drinking.    Drink 1 bottle of contrast @ ___ (2 hours prior to your exam)  Drink 1 bottle of contrast @ ___ (1 hour prior to your exam)  You may take any medications as prescribed with a small amount of water except for the following: Metformin, Glucophage, Glucovance, Avandamet, Riomet, Fortamet, Actoplus Met, Janumet, Glumetza or Metaglip. The above medications must be held the day of the exam AND 48 hours after the exam.  The purpose of you drinking the oral contrast is to aid in the visualization of your intestinal tract. The contrast solution may cause some diarrhea. Before your exam is started, you will be given a small amount of fluid to drink. Depending on your individual set of symptoms, you may also receive an intravenous injection of x-ray contrast/dye. Plan on being at [facility] for 30 minutes or long, depending on the type of exam you are having performed.  You have been scheduled for an endoscopy. Please follow written instructions given to you at your visit today. If you use inhalers (even only as needed), please bring them with you on the day of your procedure. Your physician has requested that you go to www.startemmi.com and enter  the access code given to you at your visit today. This web site gives a general overview about your procedure. However, you should still follow specific instructions given to you by our office regarding your preparation for the procedure.   If you have any questions regarding your exam or if you need to reschedule, you may call the CT department at [phone] between the hours of 8:00 am and 5:00 pm, Monday-Friday.  ________________________________________________________________________

## 2015-08-04 ENCOUNTER — Ambulatory Visit (INDEPENDENT_AMBULATORY_CARE_PROVIDER_SITE_OTHER)
Admission: RE | Admit: 2015-08-04 | Discharge: 2015-08-04 | Disposition: A | Discharge status: Home / Self Care | Payer: Medicare Other | Source: Ambulatory Visit | Attending: Internal Medicine | Admitting: Internal Medicine

## 2015-08-04 DIAGNOSIS — R1084 Generalized abdominal pain: Secondary | ICD-10-CM

## 2015-08-04 DIAGNOSIS — K5732 Diverticulitis of large intestine without perforation or abscess without bleeding: Secondary | ICD-10-CM

## 2015-08-04 MED ORDER — IOPAMIDOL (ISOVUE-300) INJECTION 61%
100.0000 mL | Freq: Once | INTRAVENOUS | Status: AC | PRN
  Administered 2015-08-04: 100 mL via INTRAVENOUS

## 2015-08-25 ENCOUNTER — Telehealth: Payer: Self-pay | Admitting: Internal Medicine

## 2015-08-25 NOTE — Telephone Encounter (Signed)
I would recommend keeping appointment, but glad he is feeling better

## 2015-08-25 NOTE — Telephone Encounter (Signed)
Spoke with pt and he will keep his appt.

## 2015-08-25 NOTE — Telephone Encounter (Signed)
Pt states he feels much better and the symptoms he was having have cleared up. Pt wants to know if he needs to keep EGD appt. Please advise.

## 2015-08-26 ENCOUNTER — Encounter: Payer: Self-pay | Admitting: Internal Medicine

## 2015-08-26 ENCOUNTER — Ambulatory Visit (AMBULATORY_SURGERY_CENTER): Payer: Medicare Other | Admitting: Internal Medicine

## 2015-08-26 VITALS — BP 131/81 | HR 63 | Temp 97.1°F | Resp 9 | Ht 67.0 in | Wt 190.0 lb

## 2015-08-26 DIAGNOSIS — R1013 Epigastric pain: Secondary | ICD-10-CM | POA: Diagnosis not present

## 2015-08-26 DIAGNOSIS — K2100 Gastro-esophageal reflux disease with esophagitis, without bleeding: Secondary | ICD-10-CM

## 2015-08-26 DIAGNOSIS — K222 Esophageal obstruction: Secondary | ICD-10-CM | POA: Diagnosis not present

## 2015-08-26 DIAGNOSIS — K219 Gastro-esophageal reflux disease without esophagitis: Secondary | ICD-10-CM | POA: Diagnosis not present

## 2015-08-26 DIAGNOSIS — R1084 Generalized abdominal pain: Secondary | ICD-10-CM | POA: Diagnosis not present

## 2015-08-26 DIAGNOSIS — E669 Obesity, unspecified: Secondary | ICD-10-CM | POA: Diagnosis not present

## 2015-08-26 DIAGNOSIS — F341 Dysthymic disorder: Secondary | ICD-10-CM | POA: Diagnosis not present

## 2015-08-26 MED ORDER — SODIUM CHLORIDE 0.9 % IV SOLN: 500.0000 mL | INTRAVENOUS | Status: DC

## 2015-08-26 NOTE — Patient Instructions (Signed)
Impressions/recommendations:  Esophagitis (handout given) GERD (handout given)  Continue protonix 40 mg daily.  YOU HAD AN ENDOSCOPIC PROCEDURE TODAY AT [facility]:   Refer to the procedure report that was given to you for any specific questions about what was found during the examination.  If the procedure report does not answer your questions, please call your gastroenterologist to clarify.  If you requested that your care partner not be given the details of your procedure findings, then the procedure report has been included in a sealed envelope for you to review at your convenience later.  YOU SHOULD EXPECT: Some feelings of bloating in the abdomen. Passage of more gas than usual.  Walking can help get rid of the air that was put into your GI tract during the procedure and reduce the bloating. If you had a lower endoscopy (such as a colonoscopy or flexible sigmoidoscopy) you may notice spotting of blood in your stool or on the toilet paper. If you underwent a bowel prep for your procedure, you may not have a normal bowel movement for a few days.  Please Note:  You might notice some irritation and congestion in your nose or some drainage.  This is from the oxygen used during your procedure.  There is no need for concern and it should clear up in a day or so.  SYMPTOMS TO REPORT IMMEDIATELY:   Following upper endoscopy (EGD)  Vomiting of blood or coffee ground material  New chest pain or pain under the shoulder blades  Painful or persistently difficult swallowing  New shortness of breath  Fever of 100F or higher  Black, tarry-looking stools  For urgent or emergent issues, a gastroenterologist can be reached at any hour by calling [phone].   DIET: Your first meal following the procedure should be a small meal and then it is ok to progress to your normal diet. Heavy or fried foods are harder to digest and may make you feel nauseous or bloated.  Likewise, meals  heavy in dairy and vegetables can increase bloating.  Drink plenty of fluids but you should avoid alcoholic beverages for 24 hours.  ACTIVITY:  You should plan to take it easy for the rest of today and you should NOT DRIVE or use heavy machinery until tomorrow (because of the sedation medicines used during the test).    FOLLOW UP: Our staff will call the number listed on your records the next business day following your procedure to check on you and address any questions or concerns that you may have regarding the information given to you following your procedure. If we do not reach you, we will leave a message.  However, if you are feeling well and you are not experiencing any problems, there is no need to return our call.  We will assume that you have returned to your regular daily activities without incident.  If any biopsies were taken you will be contacted by phone or by letter within the next 1-3 weeks.  Please call us at [phone] if you have not heard about the biopsies in 3 weeks.    SIGNATURES/CONFIDENTIALITY: You and/or your care partner have signed paperwork which will be entered into your electronic medical record.  These signatures attest to the fact that that the information above on your After Visit Summary has been reviewed and is understood.  Full responsibility of the confidentiality of this discharge information lies with you and/or your care-partner.

## 2015-08-26 NOTE — Progress Notes (Signed)
No egg or soy allergy known to patient  No issues with past sedation with any surgeries  or procedures, no intubation problems  No diet pills per patient No home 02 use per patient  No blood thinners per patient  Pt denies issues with constipation   

## 2015-08-26 NOTE — Progress Notes (Signed)
Report to PACU, RN, vss, BBS= Clear.  

## 2015-08-26 NOTE — Op Note (Signed)
[facility] Patient Name: Carl [name] Procedure Date: 08/26/2015 8:37 AM MRN: [id number] Endoscopist: Docia Chuck. Henrene Pastor , MD Age: 70 Referring MD:  Date of Birth: 1945-08-04 Gender: Male Account #: [id number] Procedure:                Upper GI endoscopy Indications:              Epigastric abdominal pain Medicines:                Monitored Anesthesia Care Procedure:                Pre-Anesthesia Assessment:                           - Prior to the procedure, a History and Physical                            was performed, and patient medications and                            allergies were reviewed. The patient's tolerance of                            previous anesthesia was also reviewed. The risks                            and benefits of the procedure and the sedation                            options and risks were discussed with the patient.                            All questions were answered, and informed consent                            was obtained. Prior Anticoagulants: The patient has                            taken no previous anticoagulant or antiplatelet                            agents. ASA Grade Assessment: II - A patient with                            mild systemic disease. After reviewing the risks                            and benefits, the patient was deemed in                            satisfactory condition to undergo the procedure.                           After obtaining informed consent, the endoscope was  passed under direct vision. Throughout the                            procedure, the patient's blood pressure, pulse, and                            oxygen saturations were monitored continuously. The                            Model GIF-HQ190 [id number]) scope was introduced                            through the mouth, and advanced to the second part                            of duodenum. The upper GI endoscopy  was                            accomplished without difficulty. The patient                            tolerated the procedure well. Scope In: Scope Out: Findings:                 Mild esophagitis was found at the mucosal Z line.                           One mild benign-appearing, intrinsic stricture was                            found at the gastroesophageal junction.                           The exam of the esophagus was otherwise normal.                           The entire examined stomach was normal.                           The cardia and gastric fundus were normal on                            retroflexion.                           The examined duodenum was normal. Complications:            No immediate complications. Estimated Blood Loss:     Estimated blood loss: none. Impression:               - GERD with mild esophagitis and large caliber                            peptic stricture (asymptomatic). Recommendation:           - Continue pantoprazole (Protonix) once daily.                           -  Follow-up with Dr. Henrene Pastor as needed. Docia Chuck. Henrene Pastor, MD 08/26/2015 9:15:31 AM This report has been signed electronically.

## 2015-08-27 ENCOUNTER — Telehealth: Payer: Self-pay | Admitting: *Deleted

## 2015-08-27 NOTE — Telephone Encounter (Signed)
  Follow up Call-  Call back number 08/26/2015 01/22/2015  Post procedure Call Back phone  # [phone]  Permission to leave phone message Yes Yes     Patient questions:  Do you have a fever, pain , or abdominal swelling? No. Pain Score  0 *  Have you tolerated food without any problems? Yes.    Have you been able to return to your normal activities? Yes.    Do you have any questions about your discharge instructions: Diet   No. Medications  No. Follow up visit  No.  Do you have questions or concerns about your Care? No.  Actions: * If pain score is 4 or above: No action needed, pain <4.

## 2015-11-04 ENCOUNTER — Telehealth: Payer: Self-pay | Admitting: Internal Medicine

## 2015-11-04 ENCOUNTER — Telehealth: Payer: Self-pay | Admitting: Gastroenterology

## 2015-11-04 ENCOUNTER — Other Ambulatory Visit: Payer: Self-pay

## 2015-11-04 DIAGNOSIS — R1032 Left lower quadrant pain: Secondary | ICD-10-CM

## 2015-11-04 NOTE — Telephone Encounter (Signed)
He should continue Cipro and Flagyl if he has not. If he has, please prescribed additional course. Also make sure he is on PPI for history of ulcer disease. Finally, given the severity of his symptoms I would like him to get a contrast-enhanced CT scan of the abdomen and pelvis ASAP. Thank you

## 2015-11-04 NOTE — Telephone Encounter (Signed)
Pt aware and has continued the antibiotics. Pt to come for labs in the am and pick up contrast. CT of A/P scheduled at [facility] CT tomorrow at 4:15pm, pt to arrive there at 4pm. Pt to be NPO after noon and drink contrast bottle 1 at 2pm, bottle 2 at 3pm. Pt aware.

## 2015-11-04 NOTE — Telephone Encounter (Signed)
On call note. Pt has a new temp of 100.7, no chills or other new symptoms. He is taking Cipro and Flagyl for presumed diverticulitis for 3 days and has a CT scan scheduled tomorrow. Advised to take Tylenol every 4-6 hours for temp control. If fever worsens or other symptoms please call back tonight. Contact Dr. Henrene Pastor tomorrow for further plans.

## 2015-11-04 NOTE — Telephone Encounter (Signed)
Carl [name] states he started having abdominal pain in his stomach 3 days ago on the left side of his abdomen right above his belly button. Carl [name] reports he did 2 days of clear liquids and started his cipro and flagyl. He did drink a protein shake today and states the pain was much worse. He took 2 doses of Emetrol that he got otc and states this helped and he does feel some better. Carl [name] wanted to let Dr. Henrene Pastor know and see if he wanted him to have a CT scan or just watch symptoms. Please advise.

## 2015-11-05 ENCOUNTER — Other Ambulatory Visit: Payer: Medicare Other

## 2015-11-05 ENCOUNTER — Ambulatory Visit (INDEPENDENT_AMBULATORY_CARE_PROVIDER_SITE_OTHER)
Admission: RE | Admit: 2015-11-05 | Discharge: 2015-11-05 | Disposition: A | Discharge status: Home / Self Care | Payer: Medicare Other | Source: Ambulatory Visit | Attending: Internal Medicine | Admitting: Internal Medicine

## 2015-11-05 ENCOUNTER — Telehealth: Payer: Self-pay | Admitting: Internal Medicine

## 2015-11-05 ENCOUNTER — Other Ambulatory Visit (INDEPENDENT_AMBULATORY_CARE_PROVIDER_SITE_OTHER): Payer: Medicare Other

## 2015-11-05 DIAGNOSIS — K5732 Diverticulitis of large intestine without perforation or abscess without bleeding: Secondary | ICD-10-CM | POA: Diagnosis not present

## 2015-11-05 DIAGNOSIS — R1032 Left lower quadrant pain: Secondary | ICD-10-CM

## 2015-11-05 LAB — BASIC METABOLIC PANEL
BUN: 16 mg/dL (ref 6–23)
CALCIUM: 8.6 mg/dL (ref 8.4–10.5)
CHLORIDE: 103 meq/L (ref 96–112)
CO2: 27 meq/L (ref 19–32)
Creatinine, Ser: 1.03 mg/dL (ref 0.40–1.50)
GFR: 75.58 mL/min (ref >60.00)
GLUCOSE: 105 mg/dL — AB (ref 70–99)
Potassium: 3.9 mEq/L (ref 3.5–5.1)
SODIUM: 137 meq/L (ref 135–145)

## 2015-11-05 MED ORDER — IOPAMIDOL (ISOVUE-300) INJECTION 61%
100.0000 mL | Freq: Once | INTRAVENOUS | Status: AC | PRN
  Administered 2015-11-05: 100 mL via INTRAVENOUS

## 2015-11-05 NOTE — Telephone Encounter (Signed)
Pt drank bottle one at 1pm and wants to know when he should drink the second bottle. Per Marzetta Board at [facility] CT pt should drink second bottle at 3pm and arrive there at 4pm. Pt aware.

## 2015-11-09 ENCOUNTER — Telehealth: Payer: Self-pay | Admitting: Internal Medicine

## 2015-11-09 ENCOUNTER — Other Ambulatory Visit: Payer: Self-pay

## 2015-11-09 MED ORDER — CIPROFLOXACIN HCL 500 MG PO TABS: 500.0000 mg | ORAL_TABLET | Freq: Two times a day (BID) | ORAL | 0 refills | Status: DC

## 2015-11-09 MED ORDER — METRONIDAZOLE 500 MG PO TABS: 500.0000 mg | ORAL_TABLET | Freq: Two times a day (BID) | ORAL | 0 refills | Status: DC

## 2015-11-09 MED ORDER — DICYCLOMINE HCL 20 MG PO TABS: ORAL_TABLET | ORAL | 1 refills | Status: DC

## 2015-11-09 NOTE — Telephone Encounter (Signed)
Pt with diverticulitis, still on antibiotics. States he felt much better Saturday and it was a great day. Reports last night when he got into the bed he had a terrible stabbing pain in his gut when he go into bed. Pt states he was still for about 30 min and it eased off. This morning he states he got up and then laid back down and he had that bad stabbing pain on the left side of his abdomen that went all across the front of his abdomen. States that those areas are still sore to touch when he touches them. Pt is worried but cause he had a friend that had a ruptured diverticulum. Pt wants to know if he needs to do anything different, please advise.

## 2015-11-09 NOTE — Telephone Encounter (Signed)
Spoke with pt, he is aware. New scripts for 10 more days worth of Cipro and Flagyl were sent to pharmacy along with bentyl script. Pt scheduled to see Tye Savoy NP 11/18/15@9 :30am.

## 2015-11-09 NOTE — Telephone Encounter (Signed)
Confirm his antibiotics. He should continue treatment for an additional 10 day from now. Also, please prescribed Bentyl 20 mg every 4-6 hours as needed for pain. Get him into see an extender within the next week for follow-up. Thanks

## 2015-11-18 ENCOUNTER — Telehealth: Payer: Self-pay | Admitting: *Deleted

## 2015-11-18 ENCOUNTER — Encounter: Payer: Self-pay | Admitting: Nurse Practitioner

## 2015-11-18 ENCOUNTER — Ambulatory Visit (INDEPENDENT_AMBULATORY_CARE_PROVIDER_SITE_OTHER): Payer: Medicare Other | Admitting: Nurse Practitioner

## 2015-11-18 ENCOUNTER — Encounter (INDEPENDENT_AMBULATORY_CARE_PROVIDER_SITE_OTHER): Payer: Self-pay

## 2015-11-18 VITALS — BP 114/72 | HR 72 | Ht 67.72 in | Wt 197.5 lb

## 2015-11-18 DIAGNOSIS — K5732 Diverticulitis of large intestine without perforation or abscess without bleeding: Secondary | ICD-10-CM

## 2015-11-18 MED ORDER — CIPROFLOXACIN HCL 500 MG PO TABS: 500.0000 mg | ORAL_TABLET | Freq: Two times a day (BID) | ORAL | 0 refills | Status: DC

## 2015-11-18 MED ORDER — METRONIDAZOLE 500 MG PO TABS: 500.0000 mg | ORAL_TABLET | Freq: Two times a day (BID) | ORAL | 0 refills | Status: DC

## 2015-11-18 NOTE — Patient Instructions (Signed)
We sent presdriptions to [facility] [address] and [facility].  Cipro and Flagyl

## 2015-11-18 NOTE — Telephone Encounter (Signed)
Called [facility] [address] and [facility] with prescriptons for Flagyl 500 mg, take 1 tab twice daily for 10 days, # 20 , no refills and Cipro 500 mg, take 1 tab twice daily for 10 days, # 20 with no refills. Leeft this message on their recording.

## 2015-11-19 NOTE — Progress Notes (Signed)
     HPI: This is a 70 year old male known to Dr. Henrene Pastor for history of recurrent diverticulitis, GERD, adenomatous colon polyps NSAID related ulcers. Patient was seen in the office the end of May with periumbilical / right lower quadrant pain. Given this was new pain for patient a CT scan of the abdomen and pelvis was obtained. The appendix was normal but there was multifocal acute sigmoid diverticulitis without perforation or abscess. Patient was treated with Cipro and Flagyl. At the time of follow-up visit late June patient's symptoms had not totally resolved and he had developed new upper abdominal pain. Follow up CTscan showed near resolution of diverticulitis.  In July he underwent EGD to evaluate the upper abdominal pain. Findings included mild esophagitis, a mild GEJ stricture, exam was otherwise normal.  In late September patient called with left-sided abdominal pain and fevers. Had started clear liquids and Cipro/metronidazole 3 days prior. Repeat CTscan revealed moderate acute sigmoid diverticulitis. Prescribed additional 10 days of Cipro and metronidazole and comes today for follow-up. Patient is feeling much better, only some mild tenderness in his left lower quadrant now. No further fevers. He is eating a regular diet.   Past Medical History:  Diagnosis Date  . Anxiety    takes Xanax daily as needed  . Arthritis   . Cataract    removed both eyes  . Depression    takes Celexa daily  . Diverticulitis   . Diverticulosis   . Enlarged prostate    sightly  . Esophagitis   . Gastric ulcer   . GERD (gastroesophageal reflux disease)    takes Protonix daily  . Hx of adenomatous colonic polyps    benign  . Hyperlipidemia    takes Zocor daily  . Insomnia    takes Ambien nightly  . Joint pain   . Joint swelling    Patient's surgical history, family medical history, social history,home medications and allergies were all reviewed in Epic    Physical Exam: BP 114/72   Pulse 72    Ht 5' 7.72" (1.72 m) Comment: w/o shoes  Wt 197 lb 8 oz (89.6 kg)   BMI 30.28 kg/m   GENERAL: Pleasant, well-developed white male in NAD PSYCH: :Pleasant, cooperative, normal affect HEENT: Normocephalic, conjunctiva pink, mucous membranes moist, neck supple without masses CARDIAC:  RRR, no murmur heard, no peripheral edema PULM: Normal respiratory effort, lungs CTA bilaterally, no wheezing ABDOMEN:  soft, nondistended, mild LLQ tenderness, no hepatomegaly,  normal bowel sounds NEURO: Alert and oriented x 3, no focal neurologic deficits    ASSESSMENT and PLAN:  70 year old male with recurrent diverticulitis documented by CTscan. significantly improved on cipro / flagyl.   -Complete the course of antibiotics - lower abdominal pain should be completely gone within the next 7-10 days, otherwise patient needs to call the office . He travels a lot, generally keeps a prescription for Flagyl and Cipro on hand. I will call a ten-day course of these antibiotics.Patient knows to start clear liquids at the onset of diverticulitis symptoms.

## 2015-11-20 NOTE — Progress Notes (Signed)
Reviewed with PAC. Agree

## 2015-12-28 ENCOUNTER — Telehealth: Payer: Self-pay | Admitting: Internal Medicine

## 2015-12-28 ENCOUNTER — Telehealth: Payer: Self-pay

## 2015-12-28 MED ORDER — CIPROFLOXACIN HCL 500 MG PO TABS: 500.0000 mg | ORAL_TABLET | Freq: Two times a day (BID) | ORAL | 1 refills | Status: DC

## 2015-12-28 MED ORDER — METRONIDAZOLE 500 MG PO TABS: 500.0000 mg | ORAL_TABLET | Freq: Two times a day (BID) | ORAL | 1 refills | Status: DC

## 2015-12-28 NOTE — Telephone Encounter (Signed)
cipro and flagyl refilled

## 2016-02-15 ENCOUNTER — Other Ambulatory Visit: Payer: Self-pay | Admitting: Gastroenterology

## 2016-02-15 ENCOUNTER — Other Ambulatory Visit: Payer: Self-pay | Admitting: Internal Medicine

## 2016-02-18 NOTE — Telephone Encounter (Signed)
Medication was refilled same day. See other 12/28/15 telephone note

## 2016-04-13 DIAGNOSIS — G479 Sleep disorder, unspecified: Secondary | ICD-10-CM | POA: Diagnosis not present

## 2016-04-13 DIAGNOSIS — Z86010 Personal history of colonic polyps: Secondary | ICD-10-CM | POA: Diagnosis not present

## 2016-04-13 DIAGNOSIS — E785 Hyperlipidemia, unspecified: Secondary | ICD-10-CM | POA: Diagnosis not present

## 2016-04-13 DIAGNOSIS — Z125 Encounter for screening for malignant neoplasm of prostate: Secondary | ICD-10-CM | POA: Diagnosis not present

## 2016-04-13 DIAGNOSIS — Z8673 Personal history of transient ischemic attack (TIA), and cerebral infarction without residual deficits: Secondary | ICD-10-CM | POA: Diagnosis not present

## 2016-04-13 DIAGNOSIS — Z79899 Other long term (current) drug therapy: Secondary | ICD-10-CM | POA: Diagnosis not present

## 2016-04-13 DIAGNOSIS — F419 Anxiety disorder, unspecified: Secondary | ICD-10-CM | POA: Diagnosis not present

## 2016-04-15 ENCOUNTER — Other Ambulatory Visit: Payer: Self-pay | Admitting: Family Medicine

## 2016-04-15 DIAGNOSIS — Z8673 Personal history of transient ischemic attack (TIA), and cerebral infarction without residual deficits: Secondary | ICD-10-CM

## 2016-04-22 ENCOUNTER — Ambulatory Visit
Admission: RE | Admit: 2016-04-22 | Discharge: 2016-04-22 | Disposition: A | Discharge status: Home / Self Care | Payer: Medicare Other | Source: Ambulatory Visit | Attending: Family Medicine | Admitting: Family Medicine

## 2016-04-22 DIAGNOSIS — I6523 Occlusion and stenosis of bilateral carotid arteries: Secondary | ICD-10-CM | POA: Diagnosis not present

## 2016-04-22 DIAGNOSIS — Z8673 Personal history of transient ischemic attack (TIA), and cerebral infarction without residual deficits: Secondary | ICD-10-CM

## 2016-05-21 ENCOUNTER — Other Ambulatory Visit: Payer: Self-pay | Admitting: Internal Medicine

## 2016-05-23 ENCOUNTER — Telehealth: Payer: Self-pay | Admitting: Internal Medicine

## 2016-05-24 NOTE — Telephone Encounter (Signed)
Cipro and Flagyl refilled yesterday afternoon

## 2016-06-02 ENCOUNTER — Telehealth: Payer: Self-pay | Admitting: Internal Medicine

## 2016-06-02 MED ORDER — CIPROFLOXACIN HCL 500 MG PO TABS: 500.0000 mg | ORAL_TABLET | Freq: Two times a day (BID) | ORAL | 0 refills | Status: DC

## 2016-06-02 MED ORDER — METRONIDAZOLE 500 MG PO TABS: 500.0000 mg | ORAL_TABLET | Freq: Two times a day (BID) | ORAL | 0 refills | Status: DC

## 2016-06-02 NOTE — Telephone Encounter (Signed)
Yes, please prescribe a 10 day course of his antibiotics for possible use for possible diverticulitis flare while he is away. Thanks

## 2016-06-02 NOTE — Telephone Encounter (Signed)
Pt states he is just finishing up his abx for diverticulitis and feeling great. He is leaving Sunday for [place] for 1-2 weeks and would like some scripts for abx to take with him just in case he has an issue. Please advise.

## 2016-06-02 NOTE — Telephone Encounter (Signed)
Scripts sent to pharmacy and pt aware. 

## 2016-07-16 IMAGING — CT CT ABD-PELV W/ CM
2 of 5 series · 17 of 46 positions shown, 19 images · IV contrast (Omnipaque 300)
Comparison: 05/21/2014

CLINICAL DATA: Follow-up diverticulitis. Resolved left lower
quadrant pain. No new symptoms.

EXAM:
CT ABDOMEN AND PELVIS WITH CONTRAST
TECHNIQUE: Multidetector CT imaging of the abdomen and pelvis was performed
using the standard protocol following bolus administration of
intravenous contrast.
CONTRAST:  100mL OMNIPAQUE IOHEXOL 300 MG/ML  SOLN

[Series 2: abd/ pel 5mm · axial · 0.70mm/px · z∈[-484,-44]mm · 14 of 98 slices shown, 16 images]
[im 5/98  soft-tissue]
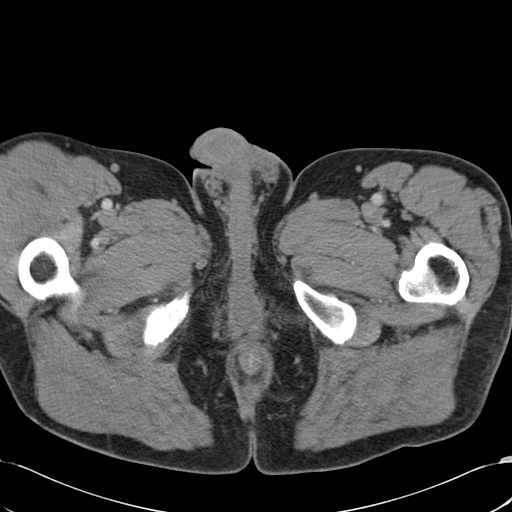
[im 5/98  bone]
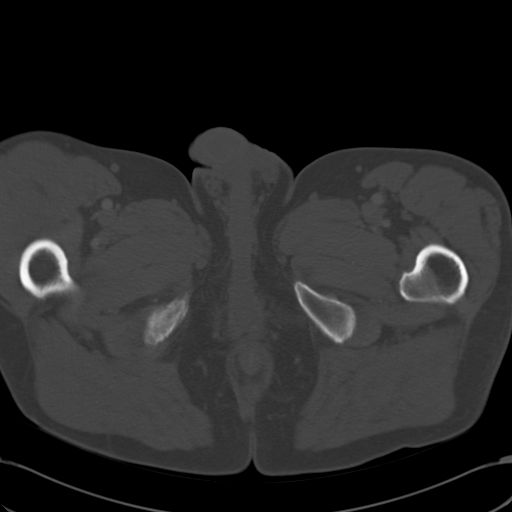
[im 15/98  soft-tissue]
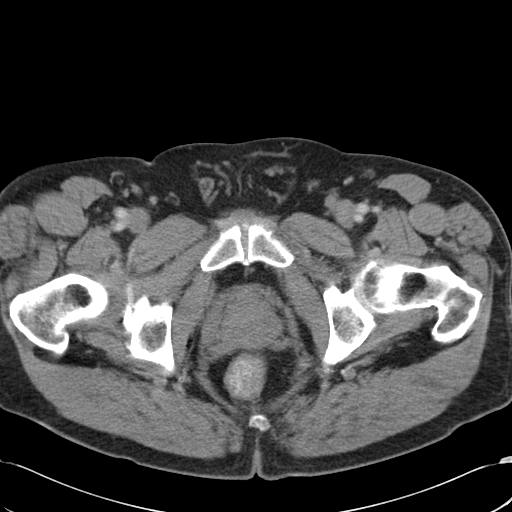
[im 20/98  soft-tissue]
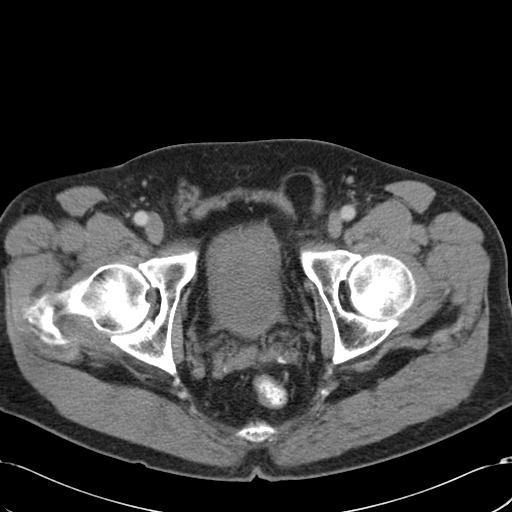
[im 25/98  soft-tissue]
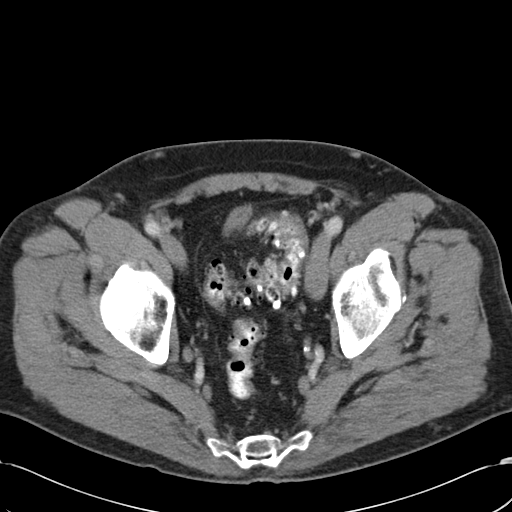
[im 34/98  soft-tissue]
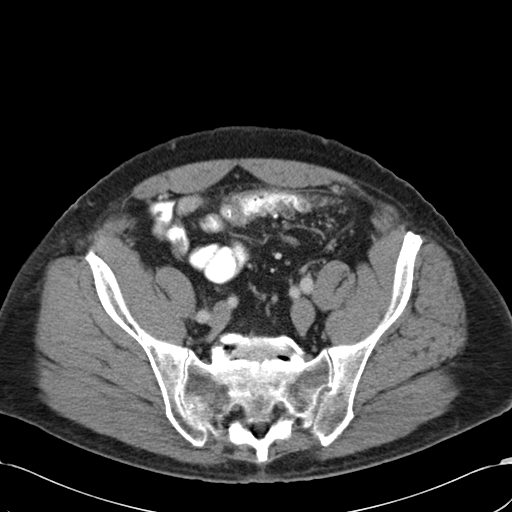
[im 39/98  soft-tissue]
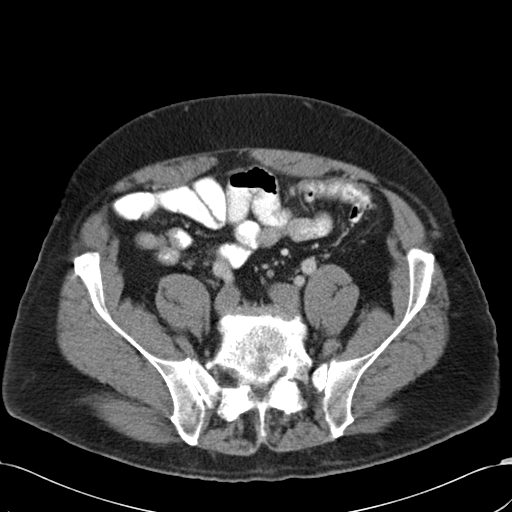
[im 44/98  soft-tissue]
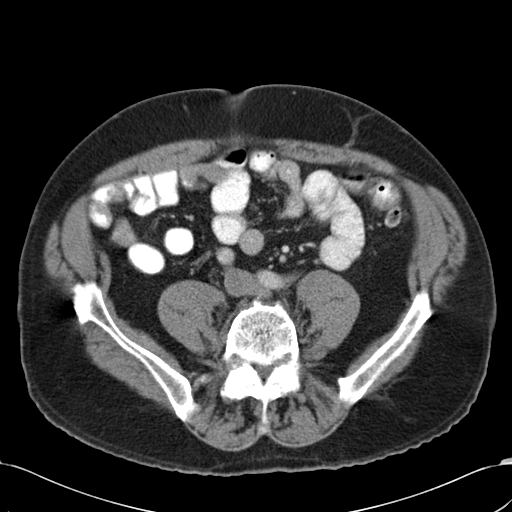
[im 54/98  soft-tissue]
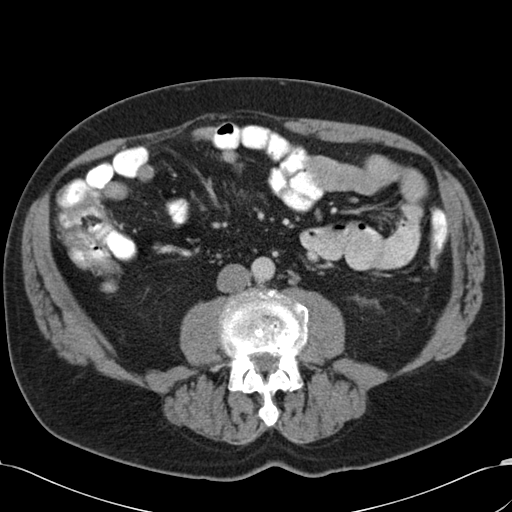
[im 59/98  soft-tissue]
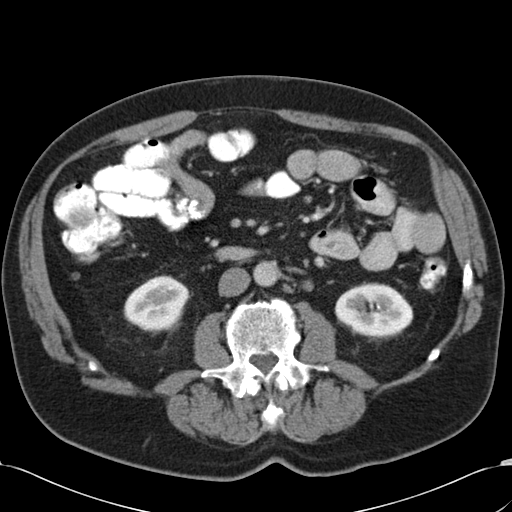
[im 59/98  bone]
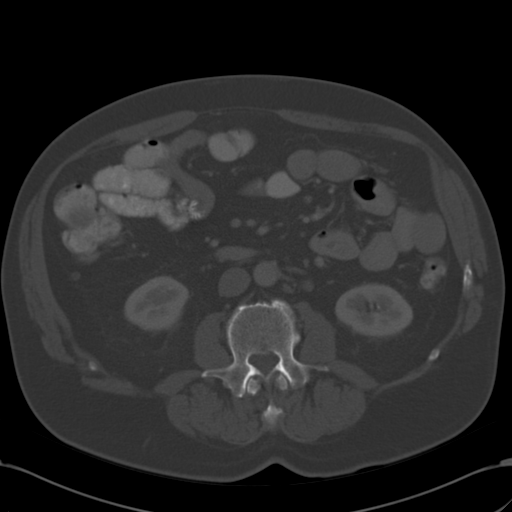
[im 64/98  soft-tissue]
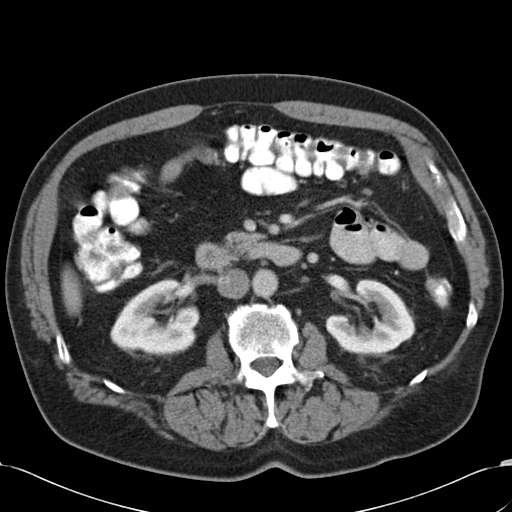
[im 73/98  soft-tissue]
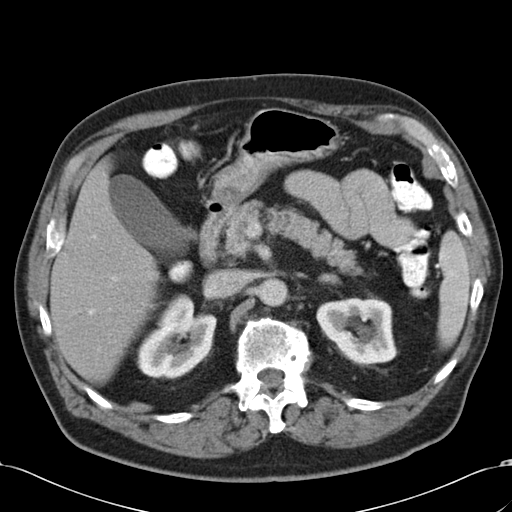
[im 78/98  soft-tissue]
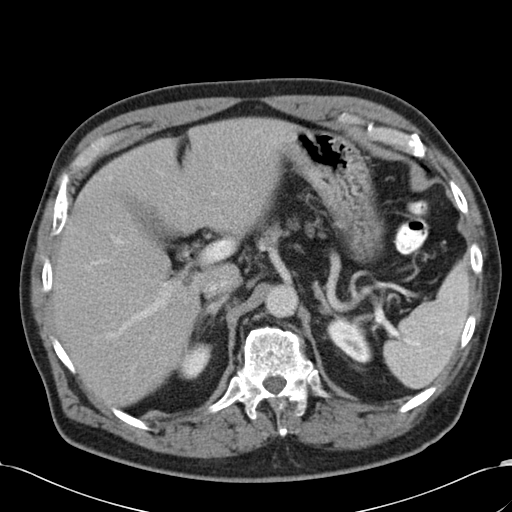
[im 83/98  soft-tissue]
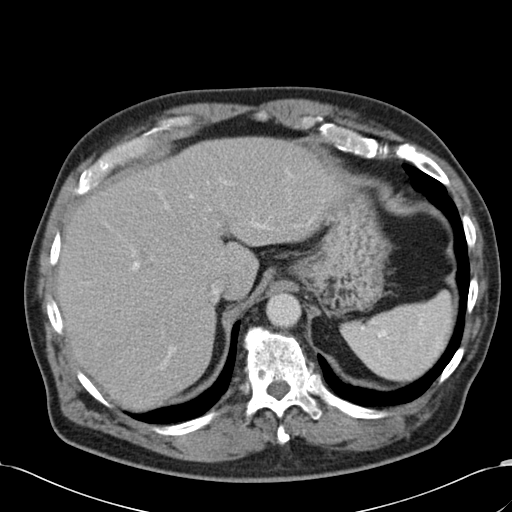
[im 93/98  soft-tissue]
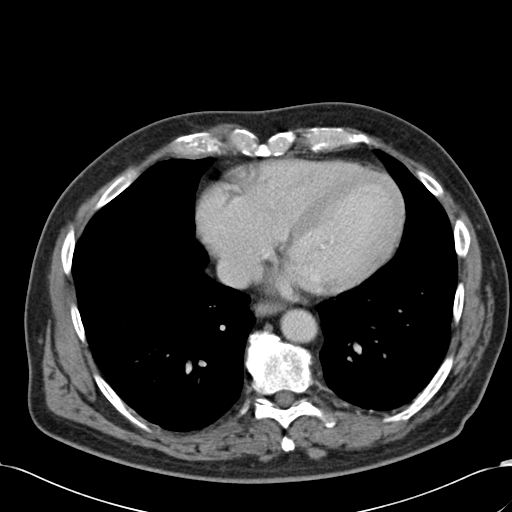

[Series 602: cor · coronal · 0.98mm/px · 3 of 129 slices shown]
[im 43/129  soft-tissue]
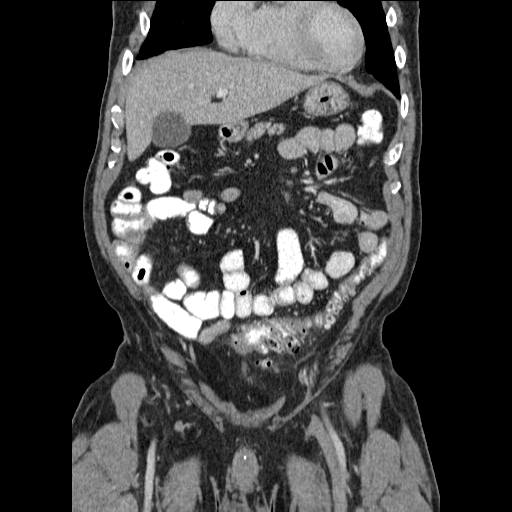
[im 57/129  soft-tissue]
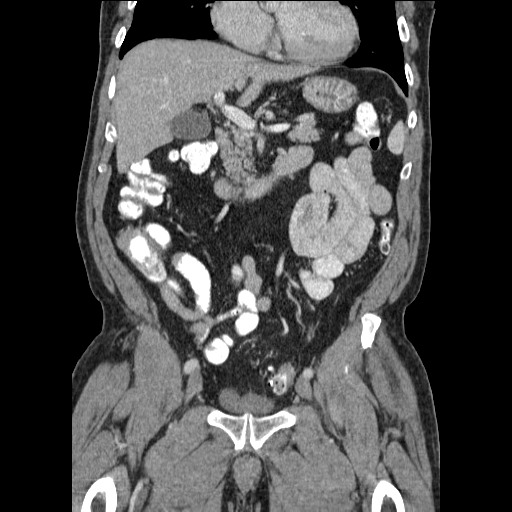
[im 72/129  soft-tissue]
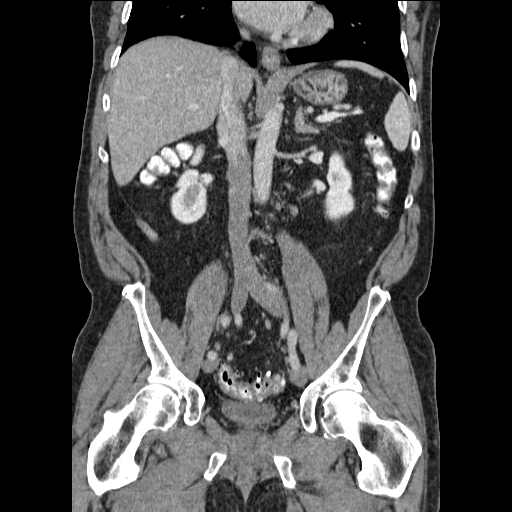

[17 of 46 positions shown; findings below may reference images not displayed]

FINDINGS: Visualized lung bases are clear.

The liver, gallbladder, spleen, adrenal glands, right kidney, and
pancreas have an unremarkable enhanced appearance. Punctate
nonobstructing left lower pole renal calculus is unchanged.

Oral contrast is present throughout nondilated small and large bowel
to the level of the rectum without evidence of obstruction.
Diverticulosis is again seen involving the sigmoid greater than
descending colon. Proximal sigmoid colon wall thickening and
pericolonic inflammation on the prior study have resolved. Appendix
is unremarkable.

Bladder is largely decompressed. Mild aortic atherosclerosis is
noted. No free fluid or enlarged lymph nodes are identified. There
is a small fat containing left inguinal hernia. Moderate
thoracolumbar spondylosis is noted.
IMPRESSION: Resolution of sigmoid diverticulitis.  No acute abnormality.

## 2016-07-25 ENCOUNTER — Encounter: Payer: Self-pay | Admitting: Internal Medicine

## 2016-07-25 NOTE — Telephone Encounter (Signed)
ERROR

## 2016-08-11 DIAGNOSIS — H353132 Nonexudative age-related macular degeneration, bilateral, intermediate dry stage: Secondary | ICD-10-CM | POA: Diagnosis not present

## 2016-08-11 DIAGNOSIS — H33321 Round hole, right eye: Secondary | ICD-10-CM | POA: Diagnosis not present

## 2016-08-11 DIAGNOSIS — H40013 Open angle with borderline findings, low risk, bilateral: Secondary | ICD-10-CM | POA: Diagnosis not present

## 2016-08-11 DIAGNOSIS — H35033 Hypertensive retinopathy, bilateral: Secondary | ICD-10-CM | POA: Diagnosis not present

## 2016-09-19 ENCOUNTER — Other Ambulatory Visit: Payer: Self-pay | Admitting: Internal Medicine

## 2016-11-01 DIAGNOSIS — L570 Actinic keratosis: Secondary | ICD-10-CM | POA: Diagnosis not present

## 2016-11-01 DIAGNOSIS — L821 Other seborrheic keratosis: Secondary | ICD-10-CM | POA: Diagnosis not present

## 2017-01-05 ENCOUNTER — Telehealth: Payer: Self-pay | Admitting: Internal Medicine

## 2017-01-05 MED ORDER — CIPROFLOXACIN HCL 500 MG PO TABS: 500.0000 mg | ORAL_TABLET | Freq: Two times a day (BID) | ORAL | 0 refills | Status: DC

## 2017-01-05 MED ORDER — METRONIDAZOLE 500 MG PO TABS: 500.0000 mg | ORAL_TABLET | Freq: Two times a day (BID) | ORAL | 0 refills | Status: DC

## 2017-01-05 NOTE — Telephone Encounter (Signed)
Cipro and Flagyl both 500 mg twice daily for one week

## 2017-01-05 NOTE — Telephone Encounter (Signed)
Please advise, see note below. 

## 2017-01-05 NOTE — Telephone Encounter (Signed)
Pt aware and scripts sent to pharmacy. 

## 2017-03-23 ENCOUNTER — Other Ambulatory Visit: Payer: Self-pay | Admitting: Internal Medicine

## 2017-04-17 DIAGNOSIS — N5201 Erectile dysfunction due to arterial insufficiency: Secondary | ICD-10-CM | POA: Diagnosis not present

## 2017-04-19 DIAGNOSIS — Z125 Encounter for screening for malignant neoplasm of prostate: Secondary | ICD-10-CM | POA: Diagnosis not present

## 2017-04-19 DIAGNOSIS — Z79899 Other long term (current) drug therapy: Secondary | ICD-10-CM | POA: Diagnosis not present

## 2017-04-19 DIAGNOSIS — Z8673 Personal history of transient ischemic attack (TIA), and cerebral infarction without residual deficits: Secondary | ICD-10-CM | POA: Diagnosis not present

## 2017-04-19 DIAGNOSIS — E785 Hyperlipidemia, unspecified: Secondary | ICD-10-CM | POA: Diagnosis not present

## 2017-04-19 DIAGNOSIS — Z0000 Encounter for general adult medical examination without abnormal findings: Secondary | ICD-10-CM | POA: Diagnosis not present

## 2017-04-19 DIAGNOSIS — F419 Anxiety disorder, unspecified: Secondary | ICD-10-CM | POA: Diagnosis not present

## 2017-04-20 DIAGNOSIS — E785 Hyperlipidemia, unspecified: Secondary | ICD-10-CM | POA: Diagnosis not present

## 2017-04-20 DIAGNOSIS — F419 Anxiety disorder, unspecified: Secondary | ICD-10-CM | POA: Diagnosis not present

## 2017-04-20 DIAGNOSIS — Z79899 Other long term (current) drug therapy: Secondary | ICD-10-CM | POA: Diagnosis not present

## 2017-04-20 DIAGNOSIS — R8290 Unspecified abnormal findings in urine: Secondary | ICD-10-CM | POA: Diagnosis not present

## 2017-04-20 DIAGNOSIS — D7589 Other specified diseases of blood and blood-forming organs: Secondary | ICD-10-CM | POA: Diagnosis not present

## 2017-04-20 DIAGNOSIS — Z8673 Personal history of transient ischemic attack (TIA), and cerebral infarction without residual deficits: Secondary | ICD-10-CM | POA: Diagnosis not present

## 2017-04-20 DIAGNOSIS — H6120 Impacted cerumen, unspecified ear: Secondary | ICD-10-CM | POA: Diagnosis not present

## 2017-04-20 DIAGNOSIS — Z0000 Encounter for general adult medical examination without abnormal findings: Secondary | ICD-10-CM | POA: Diagnosis not present

## 2017-04-20 DIAGNOSIS — L570 Actinic keratosis: Secondary | ICD-10-CM | POA: Diagnosis not present

## 2017-05-27 ENCOUNTER — Other Ambulatory Visit: Payer: Self-pay | Admitting: Nurse Practitioner

## 2017-05-27 ENCOUNTER — Telehealth: Payer: Self-pay | Admitting: Nurse Practitioner

## 2017-05-27 NOTE — Telephone Encounter (Signed)
Patient called answering service this am with lower abdominal pain. Says it is the same pain as always when he gets diverticulitis. Started yesterday but he waited it out to see if pain might resolve. No fevers or other sx. Requesting a refill on cipro / flagyl.  1. Will refill Cipro / flagyl x 7 days 2. Clear liquids until feeling better 3. Call us back ASAP if fever / worsening pain. Otherwise call our office Monday with update.

## 2017-06-13 ENCOUNTER — Telehealth: Payer: Self-pay | Admitting: Internal Medicine

## 2017-06-13 MED ORDER — PANTOPRAZOLE SODIUM 20 MG PO TBEC: DELAYED_RELEASE_TABLET | ORAL | 0 refills | Status: DC

## 2017-06-13 NOTE — Telephone Encounter (Signed)
Patient states he gave wrong address states medication sent to [facility] in [place]. Phone # [phone].

## 2017-06-13 NOTE — Telephone Encounter (Signed)
Sent rx for Pantoprazole to [facility] in [place]

## 2017-06-26 DIAGNOSIS — L821 Other seborrheic keratosis: Secondary | ICD-10-CM | POA: Diagnosis not present

## 2017-06-26 DIAGNOSIS — L570 Actinic keratosis: Secondary | ICD-10-CM | POA: Diagnosis not present

## 2017-06-28 ENCOUNTER — Other Ambulatory Visit (INDEPENDENT_AMBULATORY_CARE_PROVIDER_SITE_OTHER): Payer: PPO

## 2017-06-28 ENCOUNTER — Ambulatory Visit (INDEPENDENT_AMBULATORY_CARE_PROVIDER_SITE_OTHER): Payer: PPO | Admitting: Internal Medicine

## 2017-06-28 ENCOUNTER — Encounter: Payer: Self-pay | Admitting: Internal Medicine

## 2017-06-28 VITALS — BP 130/58 | HR 63 | Ht 68.0 in | Wt 188.0 lb

## 2017-06-28 DIAGNOSIS — R1032 Left lower quadrant pain: Secondary | ICD-10-CM

## 2017-06-28 DIAGNOSIS — K5732 Diverticulitis of large intestine without perforation or abscess without bleeding: Secondary | ICD-10-CM | POA: Diagnosis not present

## 2017-06-28 DIAGNOSIS — K2100 Gastro-esophageal reflux disease with esophagitis, without bleeding: Secondary | ICD-10-CM

## 2017-06-28 DIAGNOSIS — R109 Unspecified abdominal pain: Secondary | ICD-10-CM

## 2017-06-28 DIAGNOSIS — K222 Esophageal obstruction: Secondary | ICD-10-CM | POA: Diagnosis not present

## 2017-06-28 LAB — COMPREHENSIVE METABOLIC PANEL
ALK PHOS: 92 U/L (ref 39–117)
ALT: 27 U/L (ref 0–53)
AST: 25 U/L (ref 0–37)
Albumin: 4.1 g/dL (ref 3.5–5.2)
BUN: 22 mg/dL (ref 6–23)
CHLORIDE: 102 meq/L (ref 96–112)
CO2: 29 mEq/L (ref 19–32)
Calcium: 9.7 mg/dL (ref 8.4–10.5)
Creatinine, Ser: 0.98 mg/dL (ref 0.40–1.50)
GFR: 79.68 mL/min (ref >60.00)
GLUCOSE: 101 mg/dL — AB (ref 70–99)
POTASSIUM: 4.9 meq/L (ref 3.5–5.1)
Sodium: 139 mEq/L (ref 135–145)
TOTAL PROTEIN: 7.2 g/dL (ref 6.0–8.3)
Total Bilirubin: 0.5 mg/dL (ref 0.2–1.2)

## 2017-06-28 LAB — CBC WITH DIFFERENTIAL/PLATELET
Basophils Absolute: 0.1 10*3/uL (ref 0.0–0.1)
Basophils Relative: 1 % (ref 0.0–3.0)
EOS PCT: 3.6 % (ref 0.0–5.0)
Eosinophils Absolute: 0.3 10*3/uL (ref 0.0–0.7)
HCT: 44.3 % (ref 39.0–52.0)
HEMOGLOBIN: 15.5 g/dL (ref 13.0–17.0)
LYMPHS ABS: 1.3 10*3/uL (ref 0.7–4.0)
Lymphocytes Relative: 16.2 % (ref 12.0–46.0)
MCHC: 35 g/dL (ref 30.0–36.0)
MCV: 96.8 fl (ref 78.0–100.0)
MONOS PCT: 13 % — AB (ref 3.0–12.0)
Monocytes Absolute: 1.1 10*3/uL — ABNORMAL HIGH (ref 0.1–1.0)
NEUTROS PCT: 66.2 % (ref 43.0–77.0)
Neutro Abs: 5.4 10*3/uL (ref 1.4–7.7)
Platelets: 241 10*3/uL (ref 150.0–400.0)
RBC: 4.57 Mil/uL (ref 4.22–5.81)
RDW: 13.7 % (ref 11.5–15.5)
WBC: 8.1 10*3/uL (ref 4.0–10.5)

## 2017-06-28 LAB — URINALYSIS
Bilirubin Urine: NEGATIVE
HGB URINE DIPSTICK: NEGATIVE
Ketones, ur: NEGATIVE
Leukocytes, UA: NEGATIVE
NITRITE: NEGATIVE
SPECIFIC GRAVITY, URINE: 1.025 (ref 1.000–1.030)
Total Protein, Urine: NEGATIVE
UROBILINOGEN UA: 0.2 (ref 0.0–1.0)
Urine Glucose: NEGATIVE
pH: 6 (ref 5.0–8.0)

## 2017-06-28 LAB — LIPASE: LIPASE: 25 U/L (ref 11.0–59.0)

## 2017-06-28 MED ORDER — METRONIDAZOLE 500 MG PO TABS: 500.0000 mg | ORAL_TABLET | Freq: Two times a day (BID) | ORAL | 0 refills | Status: DC

## 2017-06-28 MED ORDER — CIPROFLOXACIN HCL 500 MG PO TABS: 500.0000 mg | ORAL_TABLET | Freq: Two times a day (BID) | ORAL | 0 refills | Status: DC

## 2017-06-28 NOTE — Patient Instructions (Signed)
We have sent the following medications to your pharmacy for you to pick up at your convenience:  Cipro, Flagyl   Your provider has requested that you go to the basement level for lab work before leaving today. Press "B" on the elevator. The lab is located at the first door on the left as you exit the elevator.   You have been scheduled for a CT scan of the abdomen and pelvis at [facility] ([address]this is in the same building as [employer]).   You are scheduled on 07/04/2017 at 2:45pm. You should arrive 15 minutes prior to your appointment time for registration. Please follow the written instructions below on the day of your exam:  WARNING: IF YOU ARE ALLERGIC TO IODINE/X-RAY DYE, PLEASE NOTIFY RADIOLOGY IMMEDIATELY AT [phone]! YOU WILL BE GIVEN A 13 HOUR PREMEDICATION PREP.  1) Do not eat or drink anything after 12:45 (4 hours prior to your test) 2) You have been given 2 bottles of oral contrast to drink. The solution may taste               better if refrigerated, but do NOT add ice or any other liquid to this solution. Shake well before drinking.    Drink 1 bottle of contrast @ 12:45 (2 hours prior to your exam)  Drink 1 bottle of contrast @ 1:45 (1 hour prior to your exam)  You may take any medications as prescribed with a small amount of water except for the following: Metformin, Glucophage, Glucovance, Avandamet, Riomet, Fortamet, Actoplus Met, Janumet, Glumetza or Metaglip. The above medications must be held the day of the exam AND 48 hours after the exam.  The purpose of you drinking the oral contrast is to aid in the visualization of your intestinal tract. The contrast solution may cause some diarrhea. Before your exam is started, you will be given a small amount of fluid to drink. Depending on your individual set of symptoms, you may also receive an intravenous injection of x-ray contrast/dye. Plan on being at [facility] for 30 minutes or  long, depending on the type of exam you are having performed.  If you have any questions regarding your exam or if you need to reschedule, you may call the CT department at [phone] between the hours of 8:00 am and 5:00 pm, Monday-Friday.   You have been scheduled for an endoscopy. Please follow written instructions given to you at your visit today. If you use inhalers (even only as needed), please bring them with you on the day of your procedure. Your physician has requested that you go to www.startemmi.com and enter the access code given to you at your visit today. This web site gives a general overview about your procedure. However, you should still follow specific instructions given to you by our office regarding your preparation for the procedure.

## 2017-06-28 NOTE — Progress Notes (Signed)
HISTORY OF PRESENT ILLNESS:  Carl [name] is a 72 y.o. male with past medical history as listed below who presents today for evaluation of abdominal pain. He has a history of GERD, peptic ulcer, and recurrent diverticulitis. Also adenomatous colon polyps. Last upper endoscopy 2017. Peptic stricture and mild esophagitis. Last colonoscopy 2016. Contacted the office recently complaining of lower abdominal pain consistent with diverticulitis. He was treated with antibiotics. Seemed to improve. However severe pain return 5 days ago while driving to [place] for his daughters graduation. Described discomfort in the suprapubic region with radiation to the groin region. No back pain. No urinary complaints specifically. Has been undergoing interventional therapy for erectile dysfunction. Last intervention 7 days ago. Feeling a little better today. Continues on PPI for GERD. Describes the discomfort as improving with meals. He does use occasional NSAIDs  REVIEW OF SYSTEMS:  All non-GI ROS negative except foranxiety, arthritis, depression, insomnia  Past Medical History:  Diagnosis Date  . Anxiety    takes Xanax daily as needed  . Arthritis   . Cataract    removed both eyes  . Depression    takes Celexa daily  . Diverticulitis   . Diverticulosis   . Enlarged prostate    sightly  . Esophagitis   . Gastric ulcer   . GERD (gastroesophageal reflux disease)    takes Protonix daily  . Hx of adenomatous colonic polyps    benign  . Hyperlipidemia    takes Zocor daily  . Insomnia    takes Ambien nightly  . Joint pain   . Joint swelling     Past Surgical History:  Procedure Laterality Date  . cartliage removed from nose  77yrs ago  . cataract surgery Bilateral   . CERVICAL SPINE SURGERY    . COLONOSCOPY    . FOREIGN BODY REMOVAL ESOPHAGEAL    . KNEE SURGERY Left    couple of times  . NASAL SINUS SURGERY    . POLYPECTOMY    . TONSILLECTOMY    . 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 He has never used smokeless tobacco. He reports that he drinks about 6.0 oz of alcohol per week. He reports that he does not use drugs.  family history includes Colon cancer in his paternal grandfather.  No Known Allergies     PHYSICAL EXAMINATION: Vital signs: BP (!) 130/58   Pulse 63   Ht 5\' 8"  (1.727 m)   Wt 188 lb (85.3 kg)   BMI 28.59 kg/m   Constitutional: generally well-appearing, no acute distress Psychiatric: alert and oriented x3, cooperative Eyes: extraocular movements intact, anicteric, conjunctiva pink Mouth: oral pharynx moist, no lesions Neck: supple no lymphadenopathy Cardiovascular: heart regular rate and rhythm, no murmur Lungs: clear to auscultation bilaterally Abdomen: soft, mild tenderness in the suprapubic region, nondistended, no obvious ascites, no peritoneal signs, normal bowel sounds, no organomegaly Rectal:omitted Extremities: no clubbing, cyanosis, or lower extremity edema bilaterally Skin: no lesions on visible extremities Neuro: No focal deficits. Cranial nerves intact  ASSESSMENT:  #1. Mid to lower abdominal pain as described. Rule out recurrent or persistent diverticulitis. Rule out urologic cause. Rule out ulcer disease with meal related complaints and personal history. #2. History of adenomatous colon polyps. Surveillance up-to-date #3. History of GERD with peptic stricture  PLAN:  #1.CBC, comprehensive metabolic panel, lipase today #2. Urinalysis today #3. Schedule contrast-enhanced CT scan of the abdomen and pelvis #4. Schedule upper endoscopy.The nature of the procedure, as well as the risks, benefits, and alternatives were carefully and thoroughly reviewed with the patient. Ample time for discussion and questions allowed. The patient understood,  was satisfied, and agreed to proceed. #5. Refilled ciprofloxacin and metronidazole have on hand should he need it #6. Follow-up labs to be determined

## 2017-07-03 ENCOUNTER — Encounter: Payer: Self-pay | Admitting: Internal Medicine

## 2017-07-04 ENCOUNTER — Ambulatory Visit (INDEPENDENT_AMBULATORY_CARE_PROVIDER_SITE_OTHER)
Admission: RE | Admit: 2017-07-04 | Discharge: 2017-07-04 | Disposition: A | Discharge status: Home / Self Care | Payer: PPO | Source: Ambulatory Visit | Attending: Internal Medicine | Admitting: Internal Medicine

## 2017-07-04 DIAGNOSIS — R109 Unspecified abdominal pain: Secondary | ICD-10-CM

## 2017-07-04 MED ORDER — IOPAMIDOL (ISOVUE-300) INJECTION 61%
100.0000 mL | Freq: Once | INTRAVENOUS | Status: AC | PRN
  Administered 2017-07-04: 100 mL via INTRAVENOUS

## 2017-07-17 ENCOUNTER — Ambulatory Visit (AMBULATORY_SURGERY_CENTER): Payer: PPO | Admitting: Internal Medicine

## 2017-07-17 ENCOUNTER — Other Ambulatory Visit: Payer: Self-pay

## 2017-07-17 ENCOUNTER — Encounter: Payer: Self-pay | Admitting: Internal Medicine

## 2017-07-17 VITALS — BP 118/72 | HR 71 | Temp 98.9°F | Resp 12 | Ht 68.0 in | Wt 188.0 lb

## 2017-07-17 DIAGNOSIS — R1013 Epigastric pain: Secondary | ICD-10-CM | POA: Diagnosis not present

## 2017-07-17 DIAGNOSIS — E669 Obesity, unspecified: Secondary | ICD-10-CM | POA: Diagnosis not present

## 2017-07-17 DIAGNOSIS — Z8673 Personal history of transient ischemic attack (TIA), and cerebral infarction without residual deficits: Secondary | ICD-10-CM | POA: Diagnosis not present

## 2017-07-17 DIAGNOSIS — R1084 Generalized abdominal pain: Secondary | ICD-10-CM

## 2017-07-17 DIAGNOSIS — R109 Unspecified abdominal pain: Secondary | ICD-10-CM | POA: Diagnosis not present

## 2017-07-17 DIAGNOSIS — K219 Gastro-esophageal reflux disease without esophagitis: Secondary | ICD-10-CM | POA: Diagnosis not present

## 2017-07-17 MED ORDER — SODIUM CHLORIDE 0.9 % IV SOLN: 500.0000 mL | Freq: Once | INTRAVENOUS | Status: DC

## 2017-07-17 MED ORDER — CIPROFLOXACIN HCL 500 MG PO TABS: 500.0000 mg | ORAL_TABLET | Freq: Two times a day (BID) | ORAL | 1 refills | Status: DC

## 2017-07-17 NOTE — Op Note (Signed)
[facility] Patient Name: Carl [name] Procedure Date: 07/17/2017 7:58 AM MRN: [id number] Endoscopist: Docia Chuck. Henrene Pastor , MD Age: 72 Referring MD:  Date of Birth: Dec 18, 1945 Gender: Male Account #: [id number] Procedure:                Upper GI endoscopy Indications:              Epigastric abdominal pain Medicines:                Monitored Anesthesia Care Procedure:                Pre-Anesthesia Assessment:                           - Prior to the procedure, a History and Physical                            was performed, and patient medications and                            allergies were reviewed. The patient's tolerance of                            previous anesthesia was also reviewed. The risks                            and benefits of the procedure and the sedation                            options and risks were discussed with the patient.                            All questions were answered, and informed consent                            was obtained. Prior Anticoagulants: The patient has                            taken no previous anticoagulant or antiplatelet                            agents. ASA Grade Assessment: II - A patient with                            mild systemic disease. After reviewing the risks                            and benefits, the patient was deemed in                            satisfactory condition to undergo the procedure.                           After obtaining informed consent, the endoscope was  passed under direct vision. Throughout the                            procedure, the patient's blood pressure, pulse, and                            oxygen saturations were monitored continuously. The                            Endoscope was introduced through the mouth, and                            advanced to the second part of duodenum. The upper                            GI endoscopy was accomplished without  difficulty.                            The patient tolerated the procedure well. Scope In: Scope Out: Findings:                 The esophagus was normal.                           The stomach was normal.                           The examined duodenum was normal.                           The cardia and gastric fundus were normal on                            retroflexion. Complications:            No immediate complications. Estimated Blood Loss:     Estimated blood loss: none. Impression:               - Normal esophagus.                           - Normal stomach.                           - Normal examined duodenum.                           - No specimens collected. Recommendation:           - Patient has a contact number available for                            emergencies. The signs and symptoms of potential                            delayed complications were discussed with the  patient. Return to normal activities tomorrow.                            Written discharge instructions were provided to the                            patient.                           - Resume previous diet.                           - Continue present medications.                           - Please prescribed Cipro 500 mg twice daily; #20;                            one refill. This for the patient to have on hand                            should he develop diverticular flare and not have                            quick access to medical care. Patient agrees to                            contact the office should he feel the need to                            initiate therapy Crescentia Boutwell N. Henrene Pastor, MD 07/17/2017 8:15:07 AM This report has been signed electronically.

## 2017-07-17 NOTE — Progress Notes (Signed)
Report to PACU, RN, vss, BBS= Clear.  

## 2017-07-17 NOTE — Progress Notes (Signed)
Pt's states no medical or surgical changes since previsit or office visit. 

## 2017-07-17 NOTE — Patient Instructions (Signed)
YOU HAD AN ENDOSCOPIC PROCEDURE TODAY AT [facility]:   Refer to the procedure report that was given to you for any specific questions about what was found during the examination.  If the procedure report does not answer your questions, please call your gastroenterologist to clarify.  If you requested that your care partner not be given the details of your procedure findings, then the procedure report has been included in a sealed envelope for you to review at your convenience later.  YOU SHOULD EXPECT: Some feelings of bloating in the abdomen. Passage of more gas than usual.  Walking can help get rid of the air that was put into your GI tract during the procedure and reduce the bloating. If you had a lower endoscopy (such as a colonoscopy or flexible sigmoidoscopy) you may notice spotting of blood in your stool or on the toilet paper. If you underwent a bowel prep for your procedure, you may not have a normal bowel movement for a few days.  Please Note:  You might notice some irritation and congestion in your nose or some drainage.  This is from the oxygen used during your procedure.  There is no need for concern and it should clear up in a day or so.  SYMPTOMS TO REPORT IMMEDIATELY:   Following upper endoscopy (EGD)  Vomiting of blood or coffee ground material  New chest pain or pain under the shoulder blades  Painful or persistently difficult swallowing  New shortness of breath  Fever of 100F or higher  Black, tarry-looking stools  For urgent or emergent issues, a gastroenterologist can be reached at any hour by calling [phone].   DIET:  We do recommend a small meal at first, but then you may proceed to your regular diet.  Drink plenty of fluids but you should avoid alcoholic beverages for 24 hours.  ACTIVITY:  You should plan to take it easy for the rest of today and you should NOT DRIVE or use heavy machinery until tomorrow (because of the sedation medicines used  during the test).    FOLLOW UP: Our staff will call the number listed on your records the next business day following your procedure to check on you and address any questions or concerns that you may have regarding the information given to you following your procedure. If we do not reach you, we will leave a message.  However, if you are feeling well and you are not experiencing any problems, there is no need to return our call.  We will assume that you have returned to your regular daily activities without incident.  If any biopsies were taken you will be contacted by phone or by letter within the next 1-3 weeks.  Please call us at [phone] if you have not heard about the biopsies in 3 weeks.   Continue previous medications, Prescription for Cipro has been called in at local pharmacy.  SIGNATURES/CONFIDENTIALITY: You and/or your care partner have signed paperwork which will be entered into your electronic medical record.  These signatures attest to the fact that that the information above on your After Visit Summary has been reviewed and is understood.  Full responsibility of the confidentiality of this discharge information lies with you and/or your care-partner.

## 2017-07-18 ENCOUNTER — Telehealth: Payer: Self-pay | Admitting: *Deleted

## 2017-07-18 ENCOUNTER — Telehealth: Payer: Self-pay

## 2017-07-18 NOTE — Telephone Encounter (Signed)
No ID on phone, no message left, will call back later today, B.Shaneque Merkle RN

## 2017-07-18 NOTE — Telephone Encounter (Signed)
  Follow up Call-  Call back number 07/17/2017 08/26/2015 01/22/2015  Post procedure Call Back phone  # [phone]  Permission to leave phone message Yes Yes Yes  Some recent data might be hidden     Patient questions:  Do you have a fever, pain , or abdominal swelling? No. Pain Score  0 *  Have you tolerated food without any problems? Yes.    Have you been able to return to your normal activities? Yes.    Do you have any questions about your discharge instructions: Diet   No. Medications  No. Follow up visit  No.  Do you have questions or concerns about your Care? No.  Actions: * If pain score is 4 or above: No action needed, pain <4.

## 2017-07-20 DIAGNOSIS — H02403 Unspecified ptosis of bilateral eyelids: Secondary | ICD-10-CM | POA: Diagnosis not present

## 2017-08-09 DIAGNOSIS — H02831 Dermatochalasis of right upper eyelid: Secondary | ICD-10-CM | POA: Diagnosis not present

## 2017-08-09 DIAGNOSIS — H02834 Dermatochalasis of left upper eyelid: Secondary | ICD-10-CM | POA: Diagnosis not present

## 2017-08-09 DIAGNOSIS — H57813 Brow ptosis, bilateral: Secondary | ICD-10-CM | POA: Diagnosis not present

## 2017-08-14 ENCOUNTER — Other Ambulatory Visit: Payer: Self-pay | Admitting: Internal Medicine

## 2017-08-15 DIAGNOSIS — H40013 Open angle with borderline findings, low risk, bilateral: Secondary | ICD-10-CM | POA: Diagnosis not present

## 2017-08-15 DIAGNOSIS — H33321 Round hole, right eye: Secondary | ICD-10-CM | POA: Diagnosis not present

## 2017-08-15 DIAGNOSIS — H353132 Nonexudative age-related macular degeneration, bilateral, intermediate dry stage: Secondary | ICD-10-CM | POA: Diagnosis not present

## 2017-08-15 DIAGNOSIS — Z961 Presence of intraocular lens: Secondary | ICD-10-CM | POA: Diagnosis not present

## 2017-08-16 DIAGNOSIS — R899 Unspecified abnormal finding in specimens from other organs, systems and tissues: Secondary | ICD-10-CM | POA: Diagnosis not present

## 2017-08-16 DIAGNOSIS — R5383 Other fatigue: Secondary | ICD-10-CM | POA: Diagnosis not present

## 2017-08-25 ENCOUNTER — Other Ambulatory Visit: Payer: Self-pay | Admitting: Internal Medicine

## 2017-08-25 ENCOUNTER — Telehealth: Payer: Self-pay | Admitting: Internal Medicine

## 2017-08-28 NOTE — Telephone Encounter (Signed)
Refilled Cipro Friday afternoon (was in clinic all day, didn't get message until after 12:00pm)

## 2017-10-05 ENCOUNTER — Telehealth: Payer: Self-pay | Admitting: Cardiovascular Disease

## 2017-10-05 NOTE — Telephone Encounter (Signed)
° ° °  Patient calling to schedule new pt appt and stress test asap. Patient states Dr Claiborne Billings agreed to see him sooner because he is his neighbor. Declined next available appt w/ Dr Claiborne Billings. No order for stress test. Please advise

## 2017-10-05 NOTE — Telephone Encounter (Signed)
Routed to [facility]

## 2017-10-06 NOTE — Telephone Encounter (Signed)
Left message to call back  Per [name], schedule 9/6 or 9/17 to assess.

## 2017-10-16 ENCOUNTER — Telehealth: Payer: Self-pay | Admitting: Cardiovascular Disease

## 2017-10-16 NOTE — Telephone Encounter (Signed)
Patria Mane A, RN    8:58 AM  Note    Left message to call back  Per [name], schedule 9/6 or 9/17 to assess.

## 2017-10-16 NOTE — Telephone Encounter (Signed)
New Message        This is [name] next neighbor his is needing a appt and per Dr Claiborne Billings he states he would work him in, pls find an appt and give patient a call, Thank you.

## 2017-10-16 NOTE — Telephone Encounter (Signed)
Patient called & scheduled for 9/17 @ 3pm

## 2017-10-18 DIAGNOSIS — K4090 Unilateral inguinal hernia, without obstruction or gangrene, not specified as recurrent: Secondary | ICD-10-CM | POA: Diagnosis not present

## 2017-10-18 DIAGNOSIS — N5201 Erectile dysfunction due to arterial insufficiency: Secondary | ICD-10-CM | POA: Diagnosis not present

## 2017-10-24 ENCOUNTER — Ambulatory Visit: Payer: PPO | Admitting: Cardiovascular Disease

## 2017-10-24 ENCOUNTER — Other Ambulatory Visit: Payer: Self-pay | Admitting: Cardiovascular Disease

## 2017-10-24 VITALS — BP 140/80 | HR 62 | Ht 68.0 in | Wt 195.2 lb

## 2017-10-24 DIAGNOSIS — E7800 Pure hypercholesterolemia, unspecified: Secondary | ICD-10-CM

## 2017-10-24 DIAGNOSIS — R0609 Other forms of dyspnea: Secondary | ICD-10-CM | POA: Diagnosis not present

## 2017-10-24 DIAGNOSIS — R0600 Dyspnea, unspecified: Secondary | ICD-10-CM

## 2017-10-24 DIAGNOSIS — I4510 Unspecified right bundle-branch block: Secondary | ICD-10-CM

## 2017-10-24 DIAGNOSIS — Z01812 Encounter for preprocedural laboratory examination: Secondary | ICD-10-CM

## 2017-10-24 MED ORDER — METOPROLOL TARTRATE 50 MG PO TABS: ORAL_TABLET | ORAL | 0 refills | Status: DC

## 2017-10-24 NOTE — Patient Instructions (Addendum)
Medication Instructions:  Your physician recommends that you continue on your current medications as directed. Please refer to the Current Medication list given to you today.  Testing/Procedures: Your physician has requested that you have an echocardiogram (prior to 9/25). Echocardiography is a painless test that uses sound waves to create images of your heart. It provides your doctor with information about the size and shape of your heart and how well your heart's chambers and valves are working. This procedure takes approximately one hour. There are no restrictions for this procedure.  This will be done at our [facility] location:  [facility] has requested that you have cardiac CT. Cardiac computed tomography (CT) is a painless test that uses an x-ray machine to take clear, detailed pictures of your heart. For further information please visit [email or web address]. Please follow instruction sheet as given.  Follow-Up: After CT with [name]  Any Other Special Instructions Will Be Listed Below (If Applicable).     If you need a refill on your cardiac medications before your next appointment, please call your pharmacy.

## 2017-10-24 NOTE — Progress Notes (Signed)
Cardiology Office Note    Date:  10/26/2017   ID:  Carl [name], DOB July 09, 1945, MRN [id number]  PCP:  Hulan Fess, MD  Cardiologist:  Shelva Majestic, MD    History of Present Illness:  Carl [name] is a 72 y.o. male who presents for the office today for evaluation of shortness of breath.  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  In 2009 he developed transient numbness of his right face which lasted for several minutes and at that time apparently underwent evaluation and had a normal MRI, MRA, and carotid duplex studies.  He was found to have only mild to moderate plaque in the proximal right internal carotid artery without stenosis.  I had seen him in March 2016 for preoperative cardiology clearance prior to undergoing left knee replacement surgery by Dr. Durward Fortes.  Preoperative ECG suggested possible junctional rhythm which was new from an ECG of 2012 which previously had only shown sinus bradycardia.  He was asymptomatic.  When I saw him, his ECG showed sinus rhythm at 61 bpm with normal intervals and on that ECG P waves were normal and upright inferiorly.  Carl [name] has remained fairly active.  However, recently he has not been exercising as much as he had in the past.  He works out with a trainer 3 days/week.  He denies associated chest pain or palpitations.  He recently has noticed more shortness of breath with walking up steps.  He was recently at a friend's house and was told that he appeared to be more short of breath than previously.  He presents now for evaluation.    Past Medical History:  Diagnosis Date  . Anxiety    takes Xanax daily as needed  . Arthritis   . Cataract    removed both eyes  . Depression    takes Celexa daily  . Diverticulitis   . Diverticulosis   . Enlarged prostate    sightly  . Esophagitis   . Gastric ulcer   . GERD (gastroesophageal reflux disease)    takes Protonix daily  . Hx of  adenomatous colonic polyps    benign  . Hyperlipidemia    takes Zocor daily  . Insomnia    takes Ambien nightly  . Joint pain   . Joint swelling     Past Surgical History:  Procedure Laterality Date  . cartliage removed from nose  73yr ago  . cataract surgery Bilateral   . CERVICAL SPINE SURGERY    . COLONOSCOPY    . FOREIGN BODY REMOVAL ESOPHAGEAL    . KNEE SURGERY Left    couple of times  . NASAL SINUS SURGERY    . POLYPECTOMY    . TONSILLECTOMY    . TOTAL KNEE ARTHROPLASTY Left 06/17/2014   Procedure: TOTAL KNEE ARTHROPLASTY;  Surgeon: PGarald Balding MD;  [facility]  Service: Orthopedics;  Laterality: Left;    Current Medications: Outpatient Medications Prior to Visit  Medication Sig Dispense Refill  . beta carotene w/minerals (OCUVITE) tablet Take 1 tablet by mouth daily.    . Magnesium Bisglycinate Dihyd POWD by Does not apply route at bedtime. Pt takes Calm powder= magnesium    . pantoprazole (PROTONIX) 20 MG tablet TAKE 2 TABLETS(40 MG) BY MOUTH DAILY 8 tablet 0  . simvastatin (ZOCOR) 40 MG tablet Take 40 mg by mouth every evening.    . valACYclovir (VALTREX) 500 MG tablet Take  500 mg by mouth daily.    Marland Kitchen zolpidem (AMBIEN) 10 MG tablet Take 10 mg by mouth at bedtime as needed for sleep.    . ciprofloxacin (CIPRO) 500 MG tablet Take 1 tablet (500 mg total) by mouth 2 (two) times daily. 20 tablet 1  . pantoprazole (PROTONIX) 20 MG tablet TAKE 2 TABLETS(40 MG) BY MOUTH DAILY 180 tablet 2  . ALPRAZolam (XANAX) 0.25 MG tablet Take 0.25 mg by mouth daily as needed for anxiety.   0  . ciprofloxacin (CIPRO) 500 MG tablet TAKE 1 TABLET(500 MG) BY MOUTH TWICE DAILY (Patient not taking: Reported on 10/24/2017) 20 tablet 0  . citalopram (CELEXA) 10 MG tablet Take 10 mg by mouth daily.    Marland Kitchen dicyclomine (BENTYL) 20 MG tablet Take 1 tablet by mouth every 4-6 hours as needed for pain (Patient not taking: Reported on 10/24/2017) 30 tablet 1  . metroNIDAZOLE (FLAGYL) 500 MG  tablet Take 1 tablet (500 mg total) by mouth 2 (two) times daily. (Patient not taking: Reported on 10/24/2017) 14 tablet 0   Facility-Administered Medications Prior to Visit  Medication Dose Route Frequency Provider Last Rate Last Dose  . 0.9 %  sodium chloride infusion  500 mL Intravenous Once Irene Shipper, MD         Allergies:   Patient has no known allergies.   Social History   Socioeconomic History  . Marital status: Single    Spouse name: Not on file  . Number of children: 3  . Years of education: Not on file  . Highest education level: Not on file  Occupational History  . Occupation: Financial controller  Social Needs  . Financial resource strain: Not on file  . Food insecurity:    Worry: Not on file    Inability: Not on file  . Transportation needs:    Medical: Not on file    Non-medical: Not on file  Tobacco Use  . Smoking status: Never Smoker  . Smokeless tobacco: Never Used  Substance and Sexual Activity  . Alcohol use: Yes    Alcohol/week: 10.0 standard drinks    Types: 10 Standard drinks or equivalent per week    Comment: 2 drinks 5 nights per week  . Drug use: No  . Sexual activity: Never  Lifestyle  . Physical activity:    Days per week: Not on file    Minutes per session: Not on file  . Stress: Not on file  Relationships  . Social connections:    Talks on phone: Not on file    Gets together: Not on file    Attends religious service: Not on file    Active member of club or organization: Not on file    Attends meetings of clubs or organizations: Not on file    Relationship status: Not on file  Other Topics Concern  . Not on file  Social History Narrative  . Not on file    Additional social history is notable in that he is divorced x2.  He has 3 children and his oldest son lives in [place].  His second son currently resides in [place] and will be getting married in [place] next week.  His daughter works in [place] as a Government social research officer in a Emergency planning/management officer.  There is no tobacco use.  He drinks alcohol.  Family History:  The patient's family history includes Colon cancer in his paternal grandfather.  Mother died at age 53 with emphysema.  His  father died at 64.  He has 2 sisters ages 55 and 6.  ROS General: Negative; No fevers, chills, or night sweats;  HEENT: Decreased hearing with hearing aid in right ear, no sinus congestion, difficulty swallowing Pulmonary: Negative; No cough, wheezing, shortness of breath, hemoptysis Cardiovascular: Negative; No chest pain, presyncope, syncope, palpitations GI: History of diverticular disease and colonic polyps. GU: Remote history of genital herpes Musculoskeletal: Negative; no myalgias, joint pain, or weakness Hematologic/Oncology: Negative; no easy bruising, bleeding Endocrine: Negative; no heat/cold intolerance; no diabetes Neuro: Negative; no changes in balance, headaches Skin: Negative; No rashes or skin lesions Psychiatric:  Sleep: Negative; No snoring, daytime sleepiness, hypersomnolence, bruxism, restless legs, hypnogognic hallucinations, no cataplexy Other comprehensive 14 point system review is negative.   PHYSICAL EXAM:   VS:  BP 140/80   Pulse 62   Ht 5' 8"  (1.727 m)   Wt 195 lb 3.2 oz (88.5 kg)   BMI 29.68 kg/m     Repeat blood pressure by me 132/80 , Wt Readings from Last 3 Encounters:  10/24/17 195 lb 3.2 oz (88.5 kg)  07/17/17 188 lb (85.3 kg)  06/28/17 188 lb (85.3 kg)    General: Alert, oriented, no distress.  Skin: normal turgor, no rashes, warm and dry HEENT: Normocephalic, atraumatic. Pupils equal round and reactive to light; sclera anicteric; extraocular muscles intact; Fundi without hemorrhages or exudates.  Discs flat. Nose without nasal septal hypertrophy Mouth/Parynx benign; Mallinpatti scale 3 Neck: No JVD, no carotid bruits; normal carotid upstroke Lungs: clear to ausculatation and percussion; no wheezing or rales Chest wall: without tenderness to  palpitation Heart: PMI not displaced, RRR, s1 s2 normal, 1/6 systolic murmur, no diastolic murmur, no rubs, gallops, thrills, or heaves Abdomen: soft, nontender; no hepatosplenomehaly, BS+; abdominal aorta nontender and not dilated by palpation. Back: no CVA tenderness Pulses 2+ Musculoskeletal: full range of motion, normal strength, no joint deformities Extremities: no clubbing cyanosis or edema, Homan's sign negative  Neurologic: grossly nonfocal; Cranial nerves grossly wnl Psychologic: Normal mood and affect  Studies/Labs Reviewed:   EKG:  EKG is ordered today.  ECG (independently read by me): Normal sinus rhythm at 62 bpm.  Right bundle branch block with repolarization changes.  Prior ECG from April 18, 2014 revealed normal sinus rhythm.  Right bundle branch block was not present.  Recent Labs: BMP Latest Ref Rng & Units 06/28/2017 11/05/2015 07/07/2015  Glucose 70 - 99 mg/dL 101(H) 105(H) 98  BUN 6 - 23 mg/dL 22 16 16   Creatinine 0.40 - 1.50 mg/dL 0.98 1.03 0.91  Sodium 135 - 145 mEq/L 139 137 137  Potassium 3.5 - 5.1 mEq/L 4.9 3.9 4.4  Chloride 96 - 112 mEq/L 102 103 101  CO2 19 - 32 mEq/L 29 27 29   Calcium 8.4 - 10.5 mg/dL 9.7 8.6 9.6     Hepatic Function Latest Ref Rng & Units 06/28/2017 07/07/2015 06/17/2014  Total Protein 6.0 - 8.3 g/dL 7.2 7.4 6.1(L)  Albumin 3.5 - 5.2 g/dL 4.1 4.5 3.6  AST 0 - 37 U/L 25 24 35  ALT 0 - 53 U/L 27 25 29   Alk Phosphatase 39 - 117 U/L 92 75 57  Total Bilirubin 0.2 - 1.2 mg/dL 0.5 0.6 0.7    CBC Latest Ref Rng & Units 06/28/2017 07/07/2015 06/19/2014  WBC 4.0 - 10.5 K/uL 8.1 13.9(H) 8.5  Hemoglobin 13.0 - 17.0 g/dL 15.5 15.6 10.9(L)  Hematocrit 39.0 - 52.0 % 44.3 45.7 32.0(L)  Platelets 150.0 - 400.0 K/uL 241.0 256.0  189   Lab Results  Component Value Date   MCV 96.8 06/28/2017   MCV 95.3 07/07/2015   MCV 94.4 06/19/2014   No results found for: TSH Lab Results  Component Value Date   HGBA1C  01/29/2007    5.4 (NOTE)   The ADA  recommends the following therapeutic goals for glycemic   control related to Hgb A1C measurement:   Goal of Therapy:   < 7.0% Hgb A1C   Action Suggested:  > 8.0% Hgb A1C   Ref:  Diabetes Care, 22, Suppl. 1, 1999     BNP No results found for: BNP  ProBNP No results found for: PROBNP   Lipid Panel     Component Value Date/Time   CHOL  01/29/2007 0410    193        ATP III CLASSIFICATION:  <200     mg/dL   Desirable  200-239  mg/dL   Borderline High  >=240    mg/dL   High   TRIG 83 01/29/2007 0410   HDL 57 01/29/2007 0410   CHOLHDL 3.4 01/29/2007 0410   VLDL 17 01/29/2007 0410   LDLCALC (H) 01/29/2007 0410    119        Total Cholesterol/HDL:CHD Risk Coronary Heart Disease Risk Table                     Men   Women  1/2 Average Risk   3.4   3.3     RADIOLOGY: No results found.   Additional studies/ records that were reviewed today include:  I reviewed his previous evaluation with me from April 18, 2014.   ASSESSMENT:    1. DOE (dyspnea on exertion)   2. RBBB   3. Pre-procedure lab exam   4. 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  He has a history of mild hyperlipidemia and has been on simvastatin 40 mg daily and has been followed by Dr. Hulan Fess.  He denies any history of chest tightness or pressure.  However recently he has noticed slight increase in shortness of breath particularly with walking up steps.  He continues to work out 3 days/week for 50 minutes with a trainer and denies any significant discomfort during his exercise.  Over the last several months he admits to slight increased weight gain.  His ECG today shows sinus rhythm but since he was seen by me in March 2016 he now has developed a right bundle branch block which is new.  With his shortness of breath, I have recommended he undergo an echo Doppler evaluation which will be helpful to assess  both systolic as well as diastolic function.  It is certainly possible that he may be developing some diastolic relaxation impairment contributing to his dyspnea.  I have also discussed evaluation for possible coronary obstructive disease.  Remotely he had undergone a normal treadmill test.  I have recommended he undergo CT coronary angiography to assess for coronary plaque.  Laboratory in March 2019 showed excellent lipid studies with a total cholesterol 133, HDL 62, LDL 55, and triglycerides at 78.  TSH was normal at 3.77 done by Dr. Rex Kras.  He will be going to [place] next week for his son's wedding.  He is planning to undergo elective facial cosmetic surgery in mid October.  I have recommended he undergo the CT coronary  angiography after his return from [place] and prior to his planned elective surgery.  I will contact him regarding the results and additional recommendations will be made at that time.   Medication Adjustments/Labs and Tests Ordered: Current medicines are reviewed at length with the patient today.  Concerns regarding medicines are outlined above.  Medication changes, Labs and Tests ordered today are listed in the Patient Instructions below. Patient Instructions  Medication Instructions:  Your physician recommends that you continue on your current medications as directed. Please refer to the Current Medication list given to you today.  Testing/Procedures: Your physician has requested that you have an echocardiogram (prior to 9/25). Echocardiography is a painless test that uses sound waves to create images of your heart. It provides your doctor with information about the size and shape of your heart and how well your heart's chambers and valves are working. This procedure takes approximately one hour. There are no restrictions for this procedure.  This will be done at our [facility] location:  [facility] has requested that you have cardiac CT.  Cardiac computed tomography (CT) is a painless test that uses an x-ray machine to take clear, detailed pictures of your heart. For further information please visit [facility]. Please follow instruction sheet as given.  Follow-Up: After CT with [name]  Any Other Special Instructions Will Be Listed Below (If Applicable).     If you need a refill on your cardiac medications before your next appointment, please call your pharmacy.      Signed, Shelva Majestic, MD  10/26/2017 9:16 PM    [facility] [address], [place], [place], [place]  [address] Phone: [phone]

## 2017-10-26 ENCOUNTER — Ambulatory Visit (HOSPITAL_COMMUNITY): Payer: PPO | Attending: Cardiology

## 2017-10-26 ENCOUNTER — Encounter: Payer: Self-pay | Admitting: Cardiovascular Disease

## 2017-10-26 ENCOUNTER — Other Ambulatory Visit: Payer: Self-pay

## 2017-10-26 DIAGNOSIS — E785 Hyperlipidemia, unspecified: Secondary | ICD-10-CM | POA: Insufficient documentation

## 2017-10-26 DIAGNOSIS — R0600 Dyspnea, unspecified: Secondary | ICD-10-CM

## 2017-10-26 DIAGNOSIS — R0609 Other forms of dyspnea: Secondary | ICD-10-CM | POA: Insufficient documentation

## 2017-10-26 DIAGNOSIS — I4510 Unspecified right bundle-branch block: Secondary | ICD-10-CM | POA: Insufficient documentation

## 2017-10-26 DIAGNOSIS — I081 Rheumatic disorders of both mitral and tricuspid valves: Secondary | ICD-10-CM | POA: Insufficient documentation

## 2017-10-30 ENCOUNTER — Telehealth: Payer: Self-pay | Admitting: Internal Medicine

## 2017-10-30 DIAGNOSIS — H60311 Diffuse otitis externa, right ear: Secondary | ICD-10-CM | POA: Diagnosis not present

## 2017-10-30 DIAGNOSIS — H903 Sensorineural hearing loss, bilateral: Secondary | ICD-10-CM | POA: Diagnosis not present

## 2017-10-30 MED ORDER — CIPROFLOXACIN HCL 500 MG PO TABS: ORAL_TABLET | ORAL | 0 refills | Status: DC

## 2017-10-30 MED ORDER — METRONIDAZOLE 500 MG PO TABS: 500.0000 mg | ORAL_TABLET | Freq: Two times a day (BID) | ORAL | 0 refills | Status: DC

## 2017-10-30 NOTE — Telephone Encounter (Signed)
cipro and flagyl sent to pharmacy

## 2017-11-07 DIAGNOSIS — I2510 Atherosclerotic heart disease of native coronary artery without angina pectoris: Secondary | ICD-10-CM

## 2017-11-07 HISTORY — DX: Atherosclerotic heart disease of native coronary artery without angina pectoris: I25.10

## 2017-11-08 ENCOUNTER — Telehealth: Payer: Self-pay | Admitting: Cardiovascular Disease

## 2017-11-08 DIAGNOSIS — Z01812 Encounter for preprocedural laboratory examination: Secondary | ICD-10-CM | POA: Diagnosis not present

## 2017-11-08 DIAGNOSIS — I4510 Unspecified right bundle-branch block: Secondary | ICD-10-CM | POA: Diagnosis not present

## 2017-11-08 DIAGNOSIS — R0609 Other forms of dyspnea: Secondary | ICD-10-CM | POA: Diagnosis not present

## 2017-11-08 NOTE — Telephone Encounter (Signed)
New message      [facility] Pre-operative Risk Assessment    Request for surgical clearance:  1. What type of surgery is being performed? Upper and Lower Eyelid Blepharopalspy  2. When is this surgery scheduled? 11/20/2017  3. What type of clearance is required (medical clearance vs. Pharmacy clearance to hold med vs. Both)?both  4. Are there any medications that need to be held prior to surgery and how long?Will leave up to Cardiologist to decide. Patient takes aspirin.  5. Practice name and name of physician performing surgery? [facility], Dr. Arnoldo Hooker Thimappa   6. What is your office phone number [phone]    7.   What is your office fax number [phone]  8.   Anesthesia type (None, local, MAC, general) ? general   Carl [name] 11/08/2017, 1:45 PM  _________________________________________________________________   (provider comments below)

## 2017-11-08 NOTE — Telephone Encounter (Signed)
   Primary Cardiologist: Shelva Majestic, MD  Chart reviewed as part of pre-operative protocol coverage.   Patient with hyperlipidemia recently seen by [name] 9.17.19.  He complained of shortness of breath and had a new RBBB on ECG.  An echocardiogram demonstrated normal LVF and mod diastolic dysfunction.  A coronary CTA is pending.    I will forward this note to [name] so that he can make final recommendations to the surgeon once the CTA results are back.  This phone note will be removed from the preop pool.   Richardson Dopp, PA-C 11/08/2017, 3:31 PM

## 2017-11-09 ENCOUNTER — Ambulatory Visit (HOSPITAL_COMMUNITY)
Admission: RE | Admit: 2017-11-09 | Discharge: 2017-11-09 | Disposition: A | Discharge status: Home / Self Care | Payer: PPO | Source: Ambulatory Visit | Attending: Cardiovascular Disease | Admitting: Cardiovascular Disease

## 2017-11-09 DIAGNOSIS — I2510 Atherosclerotic heart disease of native coronary artery without angina pectoris: Secondary | ICD-10-CM | POA: Insufficient documentation

## 2017-11-09 DIAGNOSIS — I4510 Unspecified right bundle-branch block: Secondary | ICD-10-CM

## 2017-11-09 DIAGNOSIS — R0609 Other forms of dyspnea: Secondary | ICD-10-CM | POA: Diagnosis not present

## 2017-11-09 DIAGNOSIS — R0600 Dyspnea, unspecified: Secondary | ICD-10-CM

## 2017-11-09 LAB — BASIC METABOLIC PANEL
BUN/Creatinine Ratio: 17 (ref 10–24)
BUN: 14 mg/dL (ref 8–27)
CO2: 19 mmol/L — AB (ref 20–29)
CREATININE: 0.82 mg/dL (ref 0.76–1.27)
Calcium: 9.3 mg/dL (ref 8.6–10.2)
Chloride: 101 mmol/L (ref 96–106)
GFR calc Af Amer: 101 mL/min/{1.73_m2} (ref >59)
GFR, EST NON AFRICAN AMERICAN: 88 mL/min/{1.73_m2} (ref >59)
Glucose: 92 mg/dL (ref 65–99)
Potassium: 5.2 mmol/L (ref 3.5–5.2)
SODIUM: 141 mmol/L (ref 134–144)

## 2017-11-09 MED ORDER — NITROGLYCERIN 0.4 MG SL SUBL
SUBLINGUAL_TABLET | SUBLINGUAL | Status: AC
  Filled 2017-11-09: qty 2

## 2017-11-09 MED ORDER — IOPAMIDOL (ISOVUE-370) INJECTION 76%
100.0000 mL | Freq: Once | INTRAVENOUS | Status: AC | PRN
  Administered 2017-11-09: 100 mL via INTRAVENOUS

## 2017-11-09 MED ORDER — NITROGLYCERIN 0.4 MG SL SUBL
0.8000 mg | SUBLINGUAL_TABLET | Freq: Once | SUBLINGUAL | Status: AC
  Administered 2017-11-09: 0.8 mg via SUBLINGUAL
  Filled 2017-11-09: qty 25

## 2017-11-13 ENCOUNTER — Ambulatory Visit: Payer: PPO | Admitting: Cardiovascular Disease

## 2017-11-13 ENCOUNTER — Encounter: Payer: Self-pay | Admitting: Cardiovascular Disease

## 2017-11-13 VITALS — BP 147/82 | HR 64 | Ht 68.0 in | Wt 196.0 lb

## 2017-11-13 DIAGNOSIS — Z01812 Encounter for preprocedural laboratory examination: Secondary | ICD-10-CM | POA: Diagnosis not present

## 2017-11-13 DIAGNOSIS — I10 Essential (primary) hypertension: Secondary | ICD-10-CM | POA: Diagnosis not present

## 2017-11-13 DIAGNOSIS — R931 Abnormal findings on diagnostic imaging of heart and coronary circulation: Secondary | ICD-10-CM

## 2017-11-13 DIAGNOSIS — I519 Heart disease, unspecified: Secondary | ICD-10-CM

## 2017-11-13 DIAGNOSIS — E7800 Pure hypercholesterolemia, unspecified: Secondary | ICD-10-CM | POA: Diagnosis not present

## 2017-11-13 DIAGNOSIS — I4510 Unspecified right bundle-branch block: Secondary | ICD-10-CM | POA: Diagnosis not present

## 2017-11-13 DIAGNOSIS — I5189 Other ill-defined heart diseases: Secondary | ICD-10-CM

## 2017-11-13 MED ORDER — NITROGLYCERIN 0.4 MG SL SUBL: 0.4000 mg | SUBLINGUAL_TABLET | SUBLINGUAL | 3 refills | Status: DC | PRN

## 2017-11-13 MED ORDER — ASPIRIN EC 81 MG PO TBEC: 81.0000 mg | DELAYED_RELEASE_TABLET | Freq: Every day | ORAL | 3 refills | Status: DC

## 2017-11-13 MED ORDER — AMLODIPINE BESYLATE 5 MG PO TABS: 5.0000 mg | ORAL_TABLET | Freq: Every day | ORAL | 3 refills | Status: DC

## 2017-11-13 NOTE — Telephone Encounter (Signed)
Carl [name] will be undergoing cardiac catheterization on Wednesday, October 9.  I have recommended he defer his elective dermatologic surgery

## 2017-11-13 NOTE — Progress Notes (Addendum)
Cardiology Office Note    Date:  11/13/2017   ID:  Carl [name], DOB 1945/03/09, MRN [id number]  PCP:  Hulan Fess, MD  Cardiologist:  Shelva Majestic, MD    History of Present Illness:  Carl [name] is a 72 y.o. male who presents for the office today for follow-up cardiology evaluation following his recent echo and CT coronary angiography studies.  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  In 2009 he developed transient numbness of his right face which lasted for several minutes and at that time apparently underwent evaluation and had a normal MRI, MRA, and carotid duplex studies.  He was found to have only mild to moderate plaque in the proximal right internal carotid artery without stenosis.  I had seen him in March 2016 for preoperative cardiology clearance prior to undergoing left knee replacement surgery by Dr. Durward Fortes.  Preoperative ECG suggested possible junctional rhythm which was new from an ECG of 2012 which previously had only shown sinus bradycardia.  He was asymptomatic.  When I saw him, his ECG showed sinus rhythm at 61 bpm with normal intervals and on that ECG P waves were normal and upright inferiorly.  Carl [name] has remained fairly active.  However, recently he has not been exercising as much as he had in the past.  He works out with a trainer 3 days/week.  He denies associated chest pain or palpitations.  He recently has noticed more shortness of breath with walking up steps.  He was recently at a friend's house and was told that he appeared to be more short of breath than previously.  I saw him for evaluation of his exertional shortness of breath on October 24, 2017.  At that time, I recommended that he undergo a 2D echo Doppler study and scheduled him for coronary CT angiography.  His echo Doppler study demonstrated hyperdynamic LV function with an EF of 65 to 70%.  Doppler parameters suggest grade 2 diastolic  dysfunction and elevated ventricular end-diastolic filling pressure.  He had mild MR, mild TR, and mild dilation of his left atrium.  Pulmonary pressures were normal.  CT coronary angiography was performed on November 09, 2017.  This was abnormal and demonstrated an elevated calcium score a 25 which is 78th percentile for age and sex.  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  There was greater than 75% calcific plaque in his proximal and mid diagonal 1 vessel.  The circumflex had 50% proximal plaque.  There was 50 to 75% mixed plaque in the OM 2 vessel less than 50% in the OM1 vessel.  His RCA had 50% calcified plaque proximally, 50 to 75% calcified plaque in the mid vessel.  There was mild aortic root dilatation at 4.1 cm.  His CT images were referred for [facility] analysis which were positive in the mid RCA 0.78, the mid LAD 0.78 and in the AV groove circumflex at 0.71.  I contacted the patient regarding the results.  He is seen in the office today for evaluation and discussion regarding need for cardiac catheterization.   Past Medical History:  Diagnosis Date  . Anxiety    takes Xanax daily as needed  . Arthritis   . Cataract    removed both eyes  . Depression    takes Celexa daily  .  Diverticulitis   . Diverticulosis   . Enlarged prostate    sightly  . Esophagitis   . Gastric ulcer   . GERD (gastroesophageal reflux disease)    takes Protonix daily  . Hx of adenomatous colonic polyps    benign  . Hyperlipidemia    takes Zocor daily  . Insomnia    takes Ambien nightly  . Joint pain   . Joint swelling     Past Surgical History:  Procedure Laterality Date  . cartliage removed from nose  19yr ago  . cataract surgery Bilateral   . CERVICAL SPINE SURGERY    . COLONOSCOPY    . FOREIGN BODY REMOVAL ESOPHAGEAL    . KNEE SURGERY Left    couple of times  . NASAL SINUS  SURGERY    . POLYPECTOMY    . TONSILLECTOMY    . TOTAL KNEE ARTHROPLASTY Left 06/17/2014   Procedure: TOTAL KNEE ARTHROPLASTY;  Surgeon: PGarald Balding MD;  Location: [facility]  Service: Orthopedics;  Laterality: Left;    Current Medications: Outpatient Medications Prior to Visit  Medication Sig Dispense Refill  . ALPRAZolam (XANAX) 0.25 MG tablet Take 0.25 mg by mouth daily as needed for anxiety.   0  . beta carotene w/minerals (OCUVITE) tablet Take 1 tablet by mouth daily.    . ciprofloxacin (CIPRO) 500 MG tablet TAKE 1 TABLET(500 MG) BY MOUTH TWICE DAILY 20 tablet 0  . citalopram (CELEXA) 10 MG tablet Take 10 mg by mouth daily.    .Marland Kitchendicyclomine (BENTYL) 20 MG tablet Take 1 tablet by mouth every 4-6 hours as needed for pain 30 tablet 1  . Magnesium Bisglycinate Dihyd POWD by Does not apply route at bedtime. Pt takes Calm powder= magnesium    . metoprolol tartrate (LOPRESSOR) 50 MG tablet TAKE 1 TABLET BY MOUTH 1 HOUR PRIOR TO CT 90 tablet 0  . metroNIDAZOLE (FLAGYL) 500 MG tablet Take 1 tablet (500 mg total) by mouth 2 (two) times daily. 20 tablet 0  . pantoprazole (PROTONIX) 20 MG tablet TAKE 2 TABLETS(40 MG) BY MOUTH DAILY 8 tablet 0  . simvastatin (ZOCOR) 40 MG tablet Take 40 mg by mouth every evening.    . valACYclovir (VALTREX) 500 MG tablet Take 500 mg by mouth daily.    .Marland Kitchenzolpidem (AMBIEN) 10 MG tablet Take 10 mg by mouth at bedtime as needed for sleep.     Facility-Administered Medications Prior to Visit  Medication Dose Route Frequency Provider Last Rate Last Dose  . 0.9 %  sodium chloride infusion  500 mL Intravenous Once PIrene Shipper MD         Allergies:   Patient has no known allergies.   Social History   Socioeconomic History  . Marital status: Single    Spouse name: Not on file  . Number of children: 3  . Years of education: Not on file  . Highest education level: Not on file  Occupational History  . Occupation: OFinancial controller Social Needs  . Financial resource  strain: Not on file  . Food insecurity:    Worry: Not on file    Inability: Not on file  . Transportation needs:    Medical: Not on file    Non-medical: Not on file  Tobacco Use  . Smoking status: Never Smoker  . Smokeless tobacco: Never Used  Substance and Sexual Activity  . Alcohol use: Yes    Alcohol/week: 10.0 standard drinks    Types: 10 Standard drinks  or equivalent per week    Comment: 2 drinks 5 nights per week  . Drug use: No  . Sexual activity: Never  Lifestyle  . Physical activity:    Days per week: Not on file    Minutes per session: Not on file  . Stress: Not on file  Relationships  . Social connections:    Talks on phone: Not on file    Gets together: Not on file    Attends religious service: Not on file    Active member of club or organization: Not on file    Attends meetings of clubs or organizations: Not on file    Relationship status: Not on file  Other Topics Concern  . Not on file  Social History Narrative  . Not on file    Additional social history is notable in that he is divorced x2.  He has 3 children and his oldest son lives in [place].  His second son currently resides in [place] and was just married in [place].  His daughter works in [place] as a Government social research officer in a Web designer.  There is no tobacco use.  He drinks alcohol.  Family History:  The patient's family history includes Colon cancer in his paternal grandfather.  Mother died at age 63 with emphysema.  His father died at 31.  He has 2 sisters ages 63 and 49.  ROS General: Negative; No fevers, chills, or night sweats;  HEENT: Decreased hearing with hearing aid in right ear, no sinus congestion, difficulty swallowing Pulmonary: Negative; No cough, wheezing, shortness of breath, hemoptysis Cardiovascular: Negative; No chest pain, presyncope, syncope, palpitations GI: History of diverticular disease and colonic polyps. GU: Remote history of genital herpes Musculoskeletal:  Negative; no myalgias, joint pain, or weakness Hematologic/Oncology: Negative; no easy bruising, bleeding Endocrine: Negative; no heat/cold intolerance; no diabetes Neuro: Negative; no changes in balance, headaches Skin: Negative; No rashes or skin lesions Psychiatric:  Sleep: Negative; No snoring, daytime sleepiness, hypersomnolence, bruxism, restless legs, hypnogognic hallucinations, no cataplexy Other comprehensive 14 point system review is negative.   PHYSICAL EXAM:   VS:  BP (!) 147/82   Pulse 64   Ht 5' 8"  (1.727 m)   Wt 196 lb (88.9 kg)   BMI 29.80 kg/m     Repeat blood pressure by me today was elevated at 160/80 , Wt Readings from Last 3 Encounters:  11/13/17 196 lb (88.9 kg)  10/24/17 195 lb 3.2 oz (88.5 kg)  07/17/17 188 lb (85.3 kg)    General: Alert, oriented, no distress.  Skin: normal turgor, no rashes, warm and dry HEENT: Normocephalic, atraumatic. Pupils equal round and reactive to light; sclera anicteric; extraocular muscles intact; Fundi at initial evaluation disks flat, no hemorrhages or exudates Nose without nasal septal hypertrophy Mouth/Parynx benign; Mallinpatti scale 3 Neck: No JVD, no carotid bruits; normal carotid upstroke Lungs: clear to ausculatation and percussion; no wheezing or rales Chest wall: without tenderness to palpitation Heart: PMI not displaced, RRR, s1 s2 normal, 1/6 systolic murmur, no diastolic murmur, no rubs, gallops, thrills, or heaves Abdomen: Diastases recti; soft, nontender; no hepatosplenomehaly, BS+; abdominal aorta nontender and not dilated by palpation. Back: no CVA tenderness Pulses 2+; no bruits Musculoskeletal: full range of motion, normal strength, no joint deformities Extremities: no clubbing cyanosis or edema, Homan's sign negative  Neurologic: grossly nonfocal; Cranial nerves grossly wnl Psychologic: Normal mood and affect  Studies/Labs Reviewed:   EKG:  EKG is ordered today.  ECG (independently read  by me): Sinus  rhythm at 64 bpm.  Right bundle branch block with repolarization changes.  No ectopy.  October 24, 2017 ECG (independently read by me): Normal sinus rhythm at 62 bpm.  Right bundle branch block with repolarization changes.  Prior ECG from April 18, 2014 revealed normal sinus rhythm.  Right bundle branch block was not present.  Recent Labs: BMP Latest Ref Rng & Units 11/08/2017 06/28/2017 11/05/2015  Glucose 65 - 99 mg/dL 92 101(H) 105(H)  BUN 8 - 27 mg/dL 14 22 16   Creatinine 0.76 - 1.27 mg/dL 0.82 0.98 1.03  BUN/Creat Ratio 10 - 24 17 - -  Sodium 134 - 144 mmol/L 141 139 137  Potassium 3.5 - 5.2 mmol/L 5.2 4.9 3.9  Chloride 96 - 106 mmol/L 101 102 103  CO2 20 - 29 mmol/L 19(L) 29 27  Calcium 8.6 - 10.2 mg/dL 9.3 9.7 8.6     Hepatic Function Latest Ref Rng & Units 06/28/2017 07/07/2015 06/17/2014  Total Protein 6.0 - 8.3 g/dL 7.2 7.4 6.1(L)  Albumin 3.5 - 5.2 g/dL 4.1 4.5 3.6  AST 0 - 37 U/L 25 24 35  ALT 0 - 53 U/L 27 25 29   Alk Phosphatase 39 - 117 U/L 92 75 57  Total Bilirubin 0.2 - 1.2 mg/dL 0.5 0.6 0.7    CBC Latest Ref Rng & Units 06/28/2017 07/07/2015 06/19/2014  WBC 4.0 - 10.5 K/uL 8.1 13.9(H) 8.5  Hemoglobin 13.0 - 17.0 g/dL 15.5 15.6 10.9(L)  Hematocrit 39.0 - 52.0 % 44.3 45.7 32.0(L)  Platelets 150.0 - 400.0 K/uL 241.0 256.0 189   Lab Results  Component Value Date   MCV 96.8 06/28/2017   MCV 95.3 07/07/2015   MCV 94.4 06/19/2014   No results found for: TSH Lab Results  Component Value Date   HGBA1C  01/29/2007    5.4 (NOTE)   The ADA recommends the following therapeutic goals for glycemic   control related to Hgb A1C measurement:   Goal of Therapy:   < 7.0% Hgb A1C   Action Suggested:  > 8.0% Hgb A1C   Ref:  Diabetes Care, 22, Suppl. 1, 1999     BNP No results found for: BNP  ProBNP No results found for: PROBNP   Lipid Panel     Component Value Date/Time   CHOL  01/29/2007 0410    193        ATP III CLASSIFICATION:  <200     mg/dL   Desirable   200-239  mg/dL   Borderline High  >=240    mg/dL   High   TRIG 83 01/29/2007 0410   HDL 57 01/29/2007 0410   CHOLHDL 3.4 01/29/2007 0410   VLDL 17 01/29/2007 0410   LDLCALC (H) 01/29/2007 0410    119        Total Cholesterol/HDL:CHD Risk Coronary Heart Disease Risk Table                     Men   Women  1/2 Average Risk   3.4   3.3     RADIOLOGY: Ct Coronary Morph W/cta Cor W/score W/ca W/cm &/or Wo/cm  Addendum Date: 11/09/2017   ADDENDUM REPORT: 11/09/2017 16:10 CLINICAL DATA:  Chest pain EXAM: Cardiac CTA MEDICATIONS: Sub lingual nitro. 44m and lopressor 575mTECHNIQUE: The patient was scanned on a [employer] rotation speed was 270 msecs. Collimation was.9 mm. A 100 kV prospective scan was triggered in the descending thoracic aorta at  111 HU's with 5% padding centered around 78% of the R-R interval. Average HR during the scan was 62 bpm. The 3D data set was interpreted on a dedicated work station using MPR, MIP and VRT modes. A total of 80 cc of contrast was used. FINDINGS: Non-cardiac: See separate report from [facility]. No significant findings on limited lung and soft tissue windows. Calcium Score: Calcium noted in all 3 major epicardial coronary vessels Coronary Arteries: Right dominant with no anomalies LM: Normal LAD: > 75% ostial and mid vessel calcific plaque. Less than 50% distal calcific plaque IM: Small vessel not well seen D1: Greater than 75% calcific plaque proximally and mid vessel D2: Normal Circumflex: Less than 50% proximal plaque OM1: Less than 50% proximal plaque OM2: 50-75% mixed plaque in AV groove RCA: 50% calcified plaque proximally 50-75% calcified plaque in mid vessel PDA: Normal PLA: Normal IMPRESSION: 1. Calcium score 825 which is 52 th percentile for age and sex 2. Obstructive CAD see description above Study will be sent for FFR CT but patient will likely need heart catheterization 3.  Mild aortic root dilatation 4.1 cm Carl [name]  Electronically Signed   By: Carl [name] M.D.   On: 11/09/2017 16:10   Result Date: 11/09/2017 EXAM: OVER-READ INTERPRETATION  CT CHEST The following report is an over-read performed by radiologist Dr. Rolm [name] of [facility], [place] on 11/09/2017. This over-read does not include interpretation of cardiac or coronary anatomy or pathology. The coronary CTA interpretation by the cardiologist is attached. COMPARISON:  None. FINDINGS: Vascular: Heart is normal size.  Visualized aorta is normal caliber. Mediastinum/Nodes: No adenopathy in the lower mediastinum or hila. Lungs/Pleura: Dependent atelectasis in the lower lobes. No effusions. Upper Abdomen: Imaging into the upper abdomen shows no acute findings. Musculoskeletal: Chest wall soft tissues are unremarkable. No acute bony abnormality. IMPRESSION: No acute or significant extracardiac abnormality. Electronically Signed: By: Carl [name] M.D. On: 11/09/2017 15:46   Ct Coronary Fractional Flow Reserve Data Prep  Result Date: 11/10/2017 CLINICAL DATA:  CAD EXAM: FFR CT TECHNIQUE: The best systolic and diastolic phases of the patients gated cardiac CT scan sent to Heart Flow for hemodynamic analysis FINDINGS: FFR CT positive in mid RCA .98 mid LAD .78 and AV groove .71 IMPRESSION: Positive FFR CT in mid RCA and mid LAD AV groove branch small and not likely significant Carl [name] Electronically Signed   By: Carl [name] M.D.   On: 11/10/2017 09:23     Additional studies/ records that were reviewed today include:  I reviewed his previous evaluation with me from April 18, 2014.   ASSESSMENT:    1. Abnormal cardiac CT angiography   2. Pre-procedure lab exam   3. Pure hypercholesterolemia   4. RBBB   5. Essential hypertension   6. 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  He has a history of mild  hyperlipidemia and has been on simvastatin 40 mg daily and has been followed by Dr. Hulan Fess.  He denies any history of chest tightness or pressure.  However recently he had noticed slight increase in shortness of breath particularly with walking up steps.  He continues to work out 3 days/week for 50 minutes with a trainer and denies any significant discomfort during his exercise.  Over the last several months he admits to slight increased weight  gain.  Saw him for initial evaluation several weeks ago his ECG showed sinus rhythm but he had developed a new right bundle branch block since his last ECG done by me in March 2016.  The shortness of breath I recommended an echo Doppler evaluation which confirms hyperdynamic LV function with an EF of 65 to 70% but he has grade 2 diastolic dysfunction and abnormal tissue Doppler.  Reviewed this in detail with him.  I also reviewed his CT coronary angios evaluation which is worrisome for multivessel calcific coronary obstructive disease.  FFR assessment is positive in the LAD, mid RCA, and AV groove.  I have recommended definitive cardiac catheterization.  I reviewed the procedure he and his significant other in detail.  Blood pressure today is elevated.  I have elected to add amlodipine 5 mg daily to his regimen and aspirin 81 mg.I have reviewed the risks, indications, and alternatives to cardiac catheterization, possible angioplasty, and stenting with the patient. Risks include but are not limited to bleeding, infection, vascular injury, stroke, myocardial infection, arrhythmia, kidney injury, radiation-related injury in the case of prolonged fluoroscopy use, emergency cardiac surgery, and death. The patient understands the risks of serious complication is 1-2 in 7989 with diagnostic cardiac cath and 1-2% or less with angioplasty/stenting.  Fasting laboratory will be obtained in a.m.  Catheterization will be scheduled for Wednesday, November 15, 2017 possible PCI if  indicated.  I also discussed potential options including CABG revascularization surgery.  He understands the risk benefits and agrees for the evaluation.  I have recommended he cancel his planned elective dermatologic surgery which had been scheduled for next week.  I also gave him a prescription for sublingual nitroglycerin.  I cautioned him against using erectile function medication with nitrate use.   Medication Adjustments/Labs and Tests Ordered: Current medicines are reviewed at length with the patient today.  Concerns regarding medicines are outlined above.  Medication changes, Labs and Tests ordered today are listed in the Patient Instructions below. Patient Instructions  Medication Instructions:  Start aspirin 81 mg daily Start amlodipine 5 mg daily Take sublingual nitroglycerin AS NEEDED for chest pain  If you need a refill on your cardiac medications before your next appointment, please call your pharmacy.   Lab work: Please return for FASTING labs TOMORROW (CMET, CBC, Lipid, TSH)  Our in office lab hours are Monday-Friday 8:00-4:00, closed for lunch 12:45-1:45 pm.  No appointment needed.  If you have labs (blood work) drawn today and your tests are completely normal, you will receive your results only by: Marland Kitchen MyChart Message (if you have MyChart) OR . A paper copy in the mail If you have any lab test that is abnormal or we need to change your treatment, we will call you to review the results.  Testing/Procedures: Your physician has requested that you have a cardiac catheterization. Cardiac catheterization is used to diagnose and/or treat various heart conditions. Doctors may recommend this procedure for a number of different reasons. The most common reason is to evaluate chest pain. Chest pain can be a symptom of coronary artery disease (CAD), and cardiac catheterization can show whether plaque is narrowing or blocking your heart's arteries. This procedure is also used to evaluate  the valves, as well as measure the blood flow and oxygen levels in different parts of your heart. For further information please visit HugeFiesta.[email or web address]. Please follow instruction sheet, as given.  Follow-Up: Pending cath with [name]     Signed, Shelva Majestic, MD  11/13/2017  [facility] [address] [place], [place], [place]  [address] Phone: [phone]

## 2017-11-13 NOTE — Patient Instructions (Signed)
Medication Instructions:  Start aspirin 81 mg daily Start amlodipine 5 mg daily Take sublingual nitroglycerin AS NEEDED for chest pain  If you need a refill on your cardiac medications before your next appointment, please call your pharmacy.   Lab work: Please return for FASTING labs TOMORROW (CMET, CBC, Lipid, TSH)  Our in office lab hours are Monday-Friday 8:00-4:00, closed for lunch 12:45-1:45 pm.  No appointment needed.  If you have labs (blood work) drawn today and your tests are completely normal, you will receive your results only by: Marland Kitchen MyChart Message (if you have MyChart) OR . A paper copy in the mail If you have any lab test that is abnormal or we need to change your treatment, we will call you to review the results.  Testing/Procedures: Your physician has requested that you have a cardiac catheterization. Cardiac catheterization is used to diagnose and/or treat various heart conditions. Doctors may recommend this procedure for a number of different reasons. The most common reason is to evaluate chest pain. Chest pain can be a symptom of coronary artery disease (CAD), and cardiac catheterization can show whether plaque is narrowing or blocking your heart's arteries. This procedure is also used to evaluate the valves, as well as measure the blood flow and oxygen levels in different parts of your heart. For further information please visit [facility]ugeFiesta[facility][email or web address]. Please follow instruction sheet, as given.  Follow-Up: Pending cath with [name]

## 2017-11-13 NOTE — H&P (View-Only) (Signed)
Cardiology Office Note    Date:  11/13/2017   ID:  Carl [name], DOB 1945/07/29, MRN [id number]  PCP:  Hulan Fess, MD  Cardiologist:  Shelva Majestic, MD    History of Present Illness:  Carl [name] is a 72 y.o. male who presents for the office today for follow-up cardiology evaluation following his recent echo and CT coronary angiography studies.  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  In 2009 he developed transient numbness of his right face which lasted for several minutes and at that time apparently underwent evaluation and had a normal MRI, MRA, and carotid duplex studies.  He was found to have only mild to moderate plaque in the proximal right internal carotid artery without stenosis.  I had seen him in March 2016 for preoperative cardiology clearance prior to undergoing left knee replacement surgery by Dr. Durward Fortes.  Preoperative ECG suggested possible junctional rhythm which was new from an ECG of 2012 which previously had only shown sinus bradycardia.  He was asymptomatic.  When I saw him, his ECG showed sinus rhythm at 61 bpm with normal intervals and on that ECG P waves were normal and upright inferiorly.  Carl [name] has remained fairly active.  However, recently he has not been exercising as much as he had in the past.  He works out with a trainer 3 days/week.  He denies associated chest pain or palpitations.  He recently has noticed more shortness of breath with walking up steps.  He was recently at a friend's house and was told that he appeared to be more short of breath than previously.  I saw him for evaluation of his exertional shortness of breath on October 24, 2017.  At that time, I recommended that he undergo a 2D echo Doppler study and scheduled him for coronary CT angiography.  His echo Doppler study demonstrated hyperdynamic LV function with an EF of 65 to 70%.  Doppler parameters suggest grade 2 diastolic  dysfunction and elevated ventricular end-diastolic filling pressure.  He had mild MR, mild TR, and mild dilation of his left atrium.  Pulmonary pressures were normal.  CT coronary angiography was performed on November 09, 2017.  This was abnormal and demonstrated an elevated calcium score a 25 which is 78th percentile for age and sex.  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  There was greater than 75% calcific plaque in his proximal and mid diagonal 1 vessel.  The circumflex had 50% proximal plaque.  There was 50 to 75% mixed plaque in the OM 2 vessel less than 50% in the OM1 vessel.  His RCA had 50% calcified plaque proximally, 50 to 75% calcified plaque in the mid vessel.  There was mild aortic root dilatation at 4.1 cm.  His CT images were referred for [facility] analysis which were positive in the mid RCA 0.78, the mid LAD 0.78 and in the AV groove circumflex at 0.71.  I contacted the patient regarding the results.  He is seen in the office today for evaluation and discussion regarding need for cardiac catheterization.   Past Medical History:  Diagnosis Date  . Anxiety    takes Xanax daily as needed  . Arthritis   . Cataract    removed both eyes  . Depression    takes Celexa daily  .  Diverticulitis   . Diverticulosis   . Enlarged prostate    sightly  . Esophagitis   . Gastric ulcer   . GERD (gastroesophageal reflux disease)    takes Protonix daily  . Hx of adenomatous colonic polyps    benign  . Hyperlipidemia    takes Zocor daily  . Insomnia    takes Ambien nightly  . Joint pain   . Joint swelling     Past Surgical History:  Procedure Laterality Date  . cartliage removed from nose  71yr ago  . cataract surgery Bilateral   . CERVICAL SPINE SURGERY    . COLONOSCOPY    . FOREIGN BODY REMOVAL ESOPHAGEAL    . KNEE SURGERY Left    couple of times  . NASAL SINUS  SURGERY    . POLYPECTOMY    . TONSILLECTOMY    . TOTAL KNEE ARTHROPLASTY Left 06/17/2014   Procedure: TOTAL KNEE ARTHROPLASTY;  Surgeon: PGarald Balding MD;  [facility]  Service: Orthopedics;  Laterality: Left;    Current Medications: Outpatient Medications Prior to Visit  Medication Sig Dispense Refill  . ALPRAZolam (XANAX) 0.25 MG tablet Take 0.25 mg by mouth daily as needed for anxiety.   0  . beta carotene w/minerals (OCUVITE) tablet Take 1 tablet by mouth daily.    . ciprofloxacin (CIPRO) 500 MG tablet TAKE 1 TABLET(500 MG) BY MOUTH TWICE DAILY 20 tablet 0  . citalopram (CELEXA) 10 MG tablet Take 10 mg by mouth daily.    .Marland Kitchendicyclomine (BENTYL) 20 MG tablet Take 1 tablet by mouth every 4-6 hours as needed for pain 30 tablet 1  . Magnesium Bisglycinate Dihyd POWD by Does not apply route at bedtime. Pt takes Calm powder= magnesium    . metoprolol tartrate (LOPRESSOR) 50 MG tablet TAKE 1 TABLET BY MOUTH 1 HOUR PRIOR TO CT 90 tablet 0  . metroNIDAZOLE (FLAGYL) 500 MG tablet Take 1 tablet (500 mg total) by mouth 2 (two) times daily. 20 tablet 0  . pantoprazole (PROTONIX) 20 MG tablet TAKE 2 TABLETS(40 MG) BY MOUTH DAILY 8 tablet 0  . simvastatin (ZOCOR) 40 MG tablet Take 40 mg by mouth every evening.    . valACYclovir (VALTREX) 500 MG tablet Take 500 mg by mouth daily.    .Marland Kitchenzolpidem (AMBIEN) 10 MG tablet Take 10 mg by mouth at bedtime as needed for sleep.     Facility-Administered Medications Prior to Visit  Medication Dose Route Frequency Provider Last Rate Last Dose  . 0.9 %  sodium chloride infusion  500 mL Intravenous Once PIrene Shipper MD         Allergies:   Patient has no known allergies.   Social History   Socioeconomic History  . Marital status: Single    Spouse name: Not on file  . Number of children: 3  . Years of education: Not on file  . Highest education level: Not on file  Occupational History  . Occupation: OFinancial controller Social Needs  . Financial resource  strain: Not on file  . Food insecurity:    Worry: Not on file    Inability: Not on file  . Transportation needs:    Medical: Not on file    Non-medical: Not on file  Tobacco Use  . Smoking status: Never Smoker  . Smokeless tobacco: Never Used  Substance and Sexual Activity  . Alcohol use: Yes    Alcohol/week: 10.0 standard drinks    Types: 10 Standard drinks  or equivalent per week    Comment: 2 drinks 5 nights per week  . Drug use: No  . Sexual activity: Never  Lifestyle  . Physical activity:    Days per week: Not on file    Minutes per session: Not on file  . Stress: Not on file  Relationships  . Social connections:    Talks on phone: Not on file    Gets together: Not on file    Attends religious service: Not on file    Active member of club or organization: Not on file    Attends meetings of clubs or organizations: Not on file    Relationship status: Not on file  Other Topics Concern  . Not on file  Social History Narrative  . Not on file    Additional social history is notable in that he is divorced x2.  He has 3 children and his oldest son lives in [place].  His second son currently resides in [place] and was just married in [place].  His daughter works in [place] as a Government social research officer in a Web designer.  There is no tobacco use.  He drinks alcohol.  Family History:  The patient's family history includes Colon cancer in his paternal grandfather.  Mother died at age 64 with emphysema.  His father died at 108.  He has 2 sisters ages 36 and 43.  ROS General: Negative; No fevers, chills, or night sweats;  HEENT: Decreased hearing with hearing aid in right ear, no sinus congestion, difficulty swallowing Pulmonary: Negative; No cough, wheezing, shortness of breath, hemoptysis Cardiovascular: Negative; No chest pain, presyncope, syncope, palpitations GI: History of diverticular disease and colonic polyps. GU: Remote history of genital herpes Musculoskeletal:  Negative; no myalgias, joint pain, or weakness Hematologic/Oncology: Negative; no easy bruising, bleeding Endocrine: Negative; no heat/cold intolerance; no diabetes Neuro: Negative; no changes in balance, headaches Skin: Negative; No rashes or skin lesions Psychiatric:  Sleep: Negative; No snoring, daytime sleepiness, hypersomnolence, bruxism, restless legs, hypnogognic hallucinations, no cataplexy Other comprehensive 14 point system review is negative.   PHYSICAL EXAM:   VS:  BP (!) 147/82   Pulse 64   Ht 5' 8"  (1.727 m)   Wt 196 lb (88.9 kg)   BMI 29.80 kg/m     Repeat blood pressure by me today was elevated at 160/80 , Wt Readings from Last 3 Encounters:  11/13/17 196 lb (88.9 kg)  10/24/17 195 lb 3.2 oz (88.5 kg)  07/17/17 188 lb (85.3 kg)    General: Alert, oriented, no distress.  Skin: normal turgor, no rashes, warm and dry HEENT: Normocephalic, atraumatic. Pupils equal round and reactive to light; sclera anicteric; extraocular muscles intact; Fundi at initial evaluation disks flat, no hemorrhages or exudates Nose without nasal septal hypertrophy Mouth/Parynx benign; Mallinpatti scale 3 Neck: No JVD, no carotid bruits; normal carotid upstroke Lungs: clear to ausculatation and percussion; no wheezing or rales Chest wall: without tenderness to palpitation Heart: PMI not displaced, RRR, s1 s2 normal, 1/6 systolic murmur, no diastolic murmur, no rubs, gallops, thrills, or heaves Abdomen: Diastases recti; soft, nontender; no hepatosplenomehaly, BS+; abdominal aorta nontender and not dilated by palpation. Back: no CVA tenderness Pulses 2+; no bruits Musculoskeletal: full range of motion, normal strength, no joint deformities Extremities: no clubbing cyanosis or edema, Homan's sign negative  Neurologic: grossly nonfocal; Cranial nerves grossly wnl Psychologic: Normal mood and affect  Studies/Labs Reviewed:   EKG:  EKG is ordered today.  ECG (independently read  by me): Sinus  rhythm at 64 bpm.  Right bundle branch block with repolarization changes.  No ectopy.  October 24, 2017 ECG (independently read by me): Normal sinus rhythm at 62 bpm.  Right bundle branch block with repolarization changes.  Prior ECG from April 18, 2014 revealed normal sinus rhythm.  Right bundle branch block was not present.  Recent Labs: BMP Latest Ref Rng & Units 11/08/2017 06/28/2017 11/05/2015  Glucose 65 - 99 mg/dL 92 101(H) 105(H)  BUN 8 - 27 mg/dL 14 22 16   Creatinine 0.76 - 1.27 mg/dL 0.82 0.98 1.03  BUN/Creat Ratio 10 - 24 17 - -  Sodium 134 - 144 mmol/L 141 139 137  Potassium 3.5 - 5.2 mmol/L 5.2 4.9 3.9  Chloride 96 - 106 mmol/L 101 102 103  CO2 20 - 29 mmol/L 19(L) 29 27  Calcium 8.6 - 10.2 mg/dL 9.3 9.7 8.6     Hepatic Function Latest Ref Rng & Units 06/28/2017 07/07/2015 06/17/2014  Total Protein 6.0 - 8.3 g/dL 7.2 7.4 6.1(L)  Albumin 3.5 - 5.2 g/dL 4.1 4.5 3.6  AST 0 - 37 U/L 25 24 35  ALT 0 - 53 U/L 27 25 29   Alk Phosphatase 39 - 117 U/L 92 75 57  Total Bilirubin 0.2 - 1.2 mg/dL 0.5 0.6 0.7    CBC Latest Ref Rng & Units 06/28/2017 07/07/2015 06/19/2014  WBC 4.0 - 10.5 K/uL 8.1 13.9(H) 8.5  Hemoglobin 13.0 - 17.0 g/dL 15.5 15.6 10.9(L)  Hematocrit 39.0 - 52.0 % 44.3 45.7 32.0(L)  Platelets 150.0 - 400.0 K/uL 241.0 256.0 189   Lab Results  Component Value Date   MCV 96.8 06/28/2017   MCV 95.3 07/07/2015   MCV 94.4 06/19/2014   No results found for: TSH Lab Results  Component Value Date   HGBA1C  01/29/2007    5.4 (NOTE)   The ADA recommends the following therapeutic goals for glycemic   control related to Hgb A1C measurement:   Goal of Therapy:   < 7.0% Hgb A1C   Action Suggested:  > 8.0% Hgb A1C   Ref:  Diabetes Care, 22, Suppl. 1, 1999     BNP No results found for: BNP  ProBNP No results found for: PROBNP   Lipid Panel     Component Value Date/Time   CHOL  01/29/2007 0410    193        ATP III CLASSIFICATION:  <200     mg/dL   Desirable   200-239  mg/dL   Borderline High  >=240    mg/dL   High   TRIG 83 01/29/2007 0410   HDL 57 01/29/2007 0410   CHOLHDL 3.4 01/29/2007 0410   VLDL 17 01/29/2007 0410   LDLCALC (H) 01/29/2007 0410    119        Total Cholesterol/HDL:CHD Risk Coronary Heart Disease Risk Table                     Men   Women  1/2 Average Risk   3.4   3.3     RADIOLOGY: Ct Coronary Morph W/cta Cor W/score W/ca W/cm &/or Wo/cm  Addendum Date: 11/09/2017   ADDENDUM REPORT: 11/09/2017 16:10 CLINICAL DATA:  Chest pain EXAM: Cardiac CTA MEDICATIONS: Sub lingual nitro. 33m and lopressor 529mTECHNIQUE: The patient was scanned on a [employer] rotation speed was 270 msecs. Collimation was.9 mm. A 100 kV prospective scan was triggered in the descending thoracic aorta at  111 HU's with 5% padding centered around 78% of the R-R interval. Average HR during the scan was 62 bpm. The 3D data set was interpreted on a dedicated work station using MPR, MIP and VRT modes. A total of 80 cc of contrast was used. FINDINGS: Non-cardiac: See separate report from [facility]. No significant findings on limited lung and soft tissue windows. Calcium Score: Calcium noted in all 3 major epicardial coronary vessels Coronary Arteries: Right dominant with no anomalies LM: Normal LAD: > 75% ostial and mid vessel calcific plaque. Less than 50% distal calcific plaque IM: Small vessel not well seen D1: Greater than 75% calcific plaque proximally and mid vessel D2: Normal Circumflex: Less than 50% proximal plaque OM1: Less than 50% proximal plaque OM2: 50-75% mixed plaque in AV groove RCA: 50% calcified plaque proximally 50-75% calcified plaque in mid vessel PDA: Normal PLA: Normal IMPRESSION: 1. Calcium score 825 which is 1 th percentile for age and sex 2. Obstructive CAD see description above Study will be sent for FFR CT but patient will likely need heart catheterization 3.  Mild aortic root dilatation 4.1 cm Jenkins Rouge  Electronically Signed   By: Jenkins Rouge M.D.   On: 11/09/2017 16:10   Result Date: 11/09/2017 EXAM: OVER-READ INTERPRETATION  CT CHEST The following report is an over-read performed by radiologist Dr. Rolm Baptise of [facility], [place] on 11/09/2017. This over-read does not include interpretation of cardiac or coronary anatomy or pathology. The coronary CTA interpretation by the cardiologist is attached. COMPARISON:  None. FINDINGS: Vascular: Heart is normal size.  Visualized aorta is normal caliber. Mediastinum/Nodes: No adenopathy in the lower mediastinum or hila. Lungs/Pleura: Dependent atelectasis in the lower lobes. No effusions. Upper Abdomen: Imaging into the upper abdomen shows no acute findings. Musculoskeletal: Chest wall soft tissues are unremarkable. No acute bony abnormality. IMPRESSION: No acute or significant extracardiac abnormality. Electronically Signed: By: Rolm Baptise M.D. On: 11/09/2017 15:46   Ct Coronary Fractional Flow Reserve Data Prep  Result Date: 11/10/2017 CLINICAL DATA:  CAD EXAM: FFR CT TECHNIQUE: The best systolic and diastolic phases of the patients gated cardiac CT scan sent to Heart Flow for hemodynamic analysis FINDINGS: FFR CT positive in mid RCA .55 mid LAD .78 and AV groove .71 IMPRESSION: Positive FFR CT in mid RCA and mid LAD AV groove branch small and not likely significant Jenkins Rouge Electronically Signed   By: Jenkins Rouge M.D.   On: 11/10/2017 09:23     Additional studies/ records that were reviewed today include:  I reviewed his previous evaluation with me from April 18, 2014.   ASSESSMENT:    1. Abnormal cardiac CT angiography   2. Pre-procedure lab exam   3. Pure hypercholesterolemia   4. RBBB   5. Essential hypertension   6. 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  He has a history of mild  hyperlipidemia and has been on simvastatin 40 mg daily and has been followed by Dr. Hulan Fess.  He denies any history of chest tightness or pressure.  However recently he had noticed slight increase in shortness of breath particularly with walking up steps.  He continues to work out 3 days/week for 50 minutes with a trainer and denies any significant discomfort during his exercise.  Over the last several months he admits to slight increased weight  gain.  Saw him for initial evaluation several weeks ago his ECG showed sinus rhythm but he had developed a new right bundle branch block since his last ECG done by me in March 2016.  The shortness of breath I recommended an echo Doppler evaluation which confirms hyperdynamic LV function with an EF of 65 to 70% but he has grade 2 diastolic dysfunction and abnormal tissue Doppler.  Reviewed this in detail with him.  I also reviewed his CT coronary angios evaluation which is worrisome for multivessel calcific coronary obstructive disease.  FFR assessment is positive in the LAD, mid RCA, and AV groove.  I have recommended definitive cardiac catheterization.  I reviewed the procedure he and his significant other in detail.  Blood pressure today is elevated.  I have elected to add amlodipine 5 mg daily to his regimen and aspirin 81 mg.I have reviewed the risks, indications, and alternatives to cardiac catheterization, possible angioplasty, and stenting with the patient. Risks include but are not limited to bleeding, infection, vascular injury, stroke, myocardial infection, arrhythmia, kidney injury, radiation-related injury in the case of prolonged fluoroscopy use, emergency cardiac surgery, and death. The patient understands the risks of serious complication is 1-2 in 2035 with diagnostic cardiac cath and 1-2% or less with angioplasty/stenting.  Fasting laboratory will be obtained in a.m.  Catheterization will be scheduled for Wednesday, November 15, 2017 possible PCI if  indicated.  I also discussed potential options including CABG revascularization surgery.  He understands the risk benefits and agrees for the evaluation.  I have recommended he cancel his planned elective dermatologic surgery which had been scheduled for next week.  I also gave him a prescription for sublingual nitroglycerin.  I cautioned him against using erectile function medication with nitrate use.   Medication Adjustments/Labs and Tests Ordered: Current medicines are reviewed at length with the patient today.  Concerns regarding medicines are outlined above.  Medication changes, Labs and Tests ordered today are listed in the Patient Instructions below. Patient Instructions  Medication Instructions:  Start aspirin 81 mg daily Start amlodipine 5 mg daily Take sublingual nitroglycerin AS NEEDED for chest pain  If you need a refill on your cardiac medications before your next appointment, please call your pharmacy.   Lab work: Please return for FASTING labs TOMORROW (CMET, CBC, Lipid, TSH)  Our in office lab hours are Monday-Friday 8:00-4:00, closed for lunch 12:45-1:45 pm.  No appointment needed.  If you have labs (blood work) drawn today and your tests are completely normal, you will receive your results only by: Marland Kitchen MyChart Message (if you have MyChart) OR . A paper copy in the mail If you have any lab test that is abnormal or we need to change your treatment, we will call you to review the results.  Testing/Procedures: Your physician has requested that you have a cardiac catheterization. Cardiac catheterization is used to diagnose and/or treat various heart conditions. Doctors may recommend this procedure for a number of different reasons. The most common reason is to evaluate chest pain. Chest pain can be a symptom of coronary artery disease (CAD), and cardiac catheterization can show whether plaque is narrowing or blocking your heart's arteries. This procedure is also used to evaluate  the valves, as well as measure the blood flow and oxygen levels in different parts of your heart. For further information please visit [email or web address]. Please follow instruction sheet, as given.  Follow-Up: Pending cath with [name]     Signed, Shelva Majestic, MD  11/13/2017  [facility] [address] [place], [place], [place]  [address] Phone: [phone]

## 2017-11-14 ENCOUNTER — Telehealth: Payer: Self-pay | Admitting: *Deleted

## 2017-11-14 ENCOUNTER — Other Ambulatory Visit: Payer: Self-pay | Admitting: *Deleted

## 2017-11-14 DIAGNOSIS — E7800 Pure hypercholesterolemia, unspecified: Secondary | ICD-10-CM | POA: Diagnosis not present

## 2017-11-14 DIAGNOSIS — Z01812 Encounter for preprocedural laboratory examination: Secondary | ICD-10-CM | POA: Diagnosis not present

## 2017-11-14 DIAGNOSIS — R931 Abnormal findings on diagnostic imaging of heart and coronary circulation: Secondary | ICD-10-CM | POA: Diagnosis not present

## 2017-11-14 LAB — COMPREHENSIVE METABOLIC PANEL
A/G RATIO: 1.9 (ref 1.2–2.2)
ALBUMIN: 4.4 g/dL (ref 3.5–4.8)
ALK PHOS: 85 IU/L (ref 39–117)
ALT: 36 IU/L (ref 0–44)
AST: 37 IU/L (ref 0–40)
BILIRUBIN TOTAL: 0.3 mg/dL (ref 0.0–1.2)
BUN / CREAT RATIO: 16 (ref 10–24)
BUN: 14 mg/dL (ref 8–27)
CHLORIDE: 100 mmol/L (ref 96–106)
CO2: 24 mmol/L (ref 20–29)
Calcium: 9.4 mg/dL (ref 8.6–10.2)
Creatinine, Ser: 0.87 mg/dL (ref 0.76–1.27)
GFR calc Af Amer: 99 mL/min/{1.73_m2} (ref >59)
GFR calc non Af Amer: 86 mL/min/{1.73_m2} (ref >59)
GLOBULIN, TOTAL: 2.3 g/dL (ref 1.5–4.5)
Glucose: 95 mg/dL (ref 65–99)
POTASSIUM: 5.1 mmol/L (ref 3.5–5.2)
SODIUM: 140 mmol/L (ref 134–144)
Total Protein: 6.7 g/dL (ref 6.0–8.5)

## 2017-11-14 LAB — LIPID PANEL
Chol/HDL Ratio: 3.5 ratio (ref 0.0–5.0)
Cholesterol, Total: 170 mg/dL (ref 100–199)
HDL: 48 mg/dL (ref >39)
LDL Calculated: 76 mg/dL (ref 0–99)
TRIGLYCERIDES: 231 mg/dL — AB (ref 0–149)
VLDL Cholesterol Cal: 46 mg/dL — ABNORMAL HIGH (ref 5–40)

## 2017-11-14 LAB — CBC
Hematocrit: 45.6 % (ref 37.5–51.0)
Hemoglobin: 15.8 g/dL (ref 13.0–17.7)
MCH: 33 pg (ref 26.6–33.0)
MCHC: 34.6 g/dL (ref 31.5–35.7)
MCV: 95 fL (ref 79–97)
Platelets: 264 10*3/uL (ref 150–450)
RBC: 4.79 x10E6/uL (ref 4.14–5.80)
RDW: 13.4 % (ref 12.3–15.4)
WBC: 6.2 10*3/uL (ref 3.4–10.8)

## 2017-11-14 LAB — TSH: TSH: 5.18 u[IU]/mL — AB (ref 0.450–4.500)

## 2017-11-14 NOTE — Telephone Encounter (Signed)
Routed to requesting surgeon via [place]

## 2017-11-14 NOTE — Telephone Encounter (Signed)
Message will be sent to Dr Evette Georges nurse, Angie Fava to send to surgeon once note has been completed.

## 2017-11-14 NOTE — Telephone Encounter (Signed)
Pt contacted pre-catheterization scheduled at [facility] for: Wednesday November 15, 2017 10 AM Verified arrival time and place: [place] Entrance A at: 8 AM  No solid food after midnight prior to cath, clear liquids until 5 AM day of procedure. Verified no contrast allergy Verified no diabetes medications.  AM meds can be  taken pre-cath with sip of water including: ASA 81 mg  Confirmed patient has responsible person to drive home post procedure and for 24 hours after you arrive home: yes

## 2017-11-15 ENCOUNTER — Inpatient Hospital Stay (HOSPITAL_COMMUNITY): Payer: PPO

## 2017-11-15 ENCOUNTER — Encounter (HOSPITAL_COMMUNITY)
Admission: RE | Disposition: A | Discharge status: Home / Self Care | Payer: Self-pay | Source: Ambulatory Visit | Attending: Cardiothoracic Surgery

## 2017-11-15 ENCOUNTER — Inpatient Hospital Stay (HOSPITAL_COMMUNITY)
Admission: RE | Admit: 2017-11-15 | Discharge: 2017-11-22 | DRG: 234 | Disposition: A | Discharge status: Home / Self Care | Payer: PPO | Source: Ambulatory Visit | Attending: Cardiothoracic Surgery | Admitting: Cardiothoracic Surgery

## 2017-11-15 ENCOUNTER — Encounter (HOSPITAL_COMMUNITY): Payer: Self-pay | Admitting: Cardiovascular Disease

## 2017-11-15 ENCOUNTER — Other Ambulatory Visit: Payer: Self-pay | Admitting: *Deleted

## 2017-11-15 DIAGNOSIS — I480 Paroxysmal atrial fibrillation: Secondary | ICD-10-CM | POA: Diagnosis not present

## 2017-11-15 DIAGNOSIS — R0609 Other forms of dyspnea: Secondary | ICD-10-CM | POA: Diagnosis not present

## 2017-11-15 DIAGNOSIS — Z7982 Long term (current) use of aspirin: Secondary | ICD-10-CM

## 2017-11-15 DIAGNOSIS — R7989 Other specified abnormal findings of blood chemistry: Secondary | ICD-10-CM

## 2017-11-15 DIAGNOSIS — R931 Abnormal findings on diagnostic imaging of heart and coronary circulation: Secondary | ICD-10-CM | POA: Insufficient documentation

## 2017-11-15 DIAGNOSIS — M1990 Unspecified osteoarthritis, unspecified site: Secondary | ICD-10-CM | POA: Diagnosis not present

## 2017-11-15 DIAGNOSIS — E785 Hyperlipidemia, unspecified: Secondary | ICD-10-CM | POA: Diagnosis not present

## 2017-11-15 DIAGNOSIS — Z01810 Encounter for preprocedural cardiovascular examination: Secondary | ICD-10-CM

## 2017-11-15 DIAGNOSIS — I2510 Atherosclerotic heart disease of native coronary artery without angina pectoris: Secondary | ICD-10-CM

## 2017-11-15 DIAGNOSIS — I25110 Atherosclerotic heart disease of native coronary artery with unstable angina pectoris: Principal | ICD-10-CM | POA: Diagnosis present

## 2017-11-15 DIAGNOSIS — E7800 Pure hypercholesterolemia, unspecified: Secondary | ICD-10-CM | POA: Diagnosis present

## 2017-11-15 DIAGNOSIS — I4510 Unspecified right bundle-branch block: Secondary | ICD-10-CM | POA: Diagnosis present

## 2017-11-15 DIAGNOSIS — Z825 Family history of asthma and other chronic lower respiratory diseases: Secondary | ICD-10-CM | POA: Diagnosis not present

## 2017-11-15 DIAGNOSIS — I10 Essential (primary) hypertension: Secondary | ICD-10-CM | POA: Diagnosis present

## 2017-11-15 DIAGNOSIS — M79605 Pain in left leg: Secondary | ICD-10-CM | POA: Diagnosis not present

## 2017-11-15 DIAGNOSIS — F329 Major depressive disorder, single episode, unspecified: Secondary | ICD-10-CM | POA: Diagnosis present

## 2017-11-15 DIAGNOSIS — Z9849 Cataract extraction status, unspecified eye: Secondary | ICD-10-CM | POA: Diagnosis not present

## 2017-11-15 DIAGNOSIS — I319 Disease of pericardium, unspecified: Secondary | ICD-10-CM | POA: Diagnosis not present

## 2017-11-15 DIAGNOSIS — D62 Acute posthemorrhagic anemia: Secondary | ICD-10-CM | POA: Diagnosis not present

## 2017-11-15 DIAGNOSIS — I371 Nonrheumatic pulmonary valve insufficiency: Secondary | ICD-10-CM | POA: Diagnosis not present

## 2017-11-15 DIAGNOSIS — Z79899 Other long term (current) drug therapy: Secondary | ICD-10-CM | POA: Diagnosis not present

## 2017-11-15 DIAGNOSIS — E782 Mixed hyperlipidemia: Secondary | ICD-10-CM

## 2017-11-15 DIAGNOSIS — I6523 Occlusion and stenosis of bilateral carotid arteries: Secondary | ICD-10-CM | POA: Diagnosis not present

## 2017-11-15 DIAGNOSIS — Z09 Encounter for follow-up examination after completed treatment for conditions other than malignant neoplasm: Secondary | ICD-10-CM

## 2017-11-15 DIAGNOSIS — G4700 Insomnia, unspecified: Secondary | ICD-10-CM | POA: Diagnosis not present

## 2017-11-15 DIAGNOSIS — K567 Ileus, unspecified: Secondary | ICD-10-CM | POA: Diagnosis not present

## 2017-11-15 DIAGNOSIS — I4891 Unspecified atrial fibrillation: Secondary | ICD-10-CM

## 2017-11-15 DIAGNOSIS — J9811 Atelectasis: Secondary | ICD-10-CM | POA: Diagnosis not present

## 2017-11-15 DIAGNOSIS — Z01818 Encounter for other preprocedural examination: Secondary | ICD-10-CM | POA: Diagnosis not present

## 2017-11-15 DIAGNOSIS — J811 Chronic pulmonary edema: Secondary | ICD-10-CM | POA: Diagnosis not present

## 2017-11-15 DIAGNOSIS — M79604 Pain in right leg: Secondary | ICD-10-CM | POA: Diagnosis not present

## 2017-11-15 DIAGNOSIS — Z4803 Encounter for change or removal of drains: Secondary | ICD-10-CM | POA: Diagnosis not present

## 2017-11-15 DIAGNOSIS — K219 Gastro-esophageal reflux disease without esophagitis: Secondary | ICD-10-CM | POA: Diagnosis present

## 2017-11-15 DIAGNOSIS — Z951 Presence of aortocoronary bypass graft: Secondary | ICD-10-CM | POA: Diagnosis not present

## 2017-11-15 DIAGNOSIS — F419 Anxiety disorder, unspecified: Secondary | ICD-10-CM | POA: Diagnosis present

## 2017-11-15 DIAGNOSIS — Z9689 Presence of other specified functional implants: Secondary | ICD-10-CM

## 2017-11-15 DIAGNOSIS — Z800 Family history of malignant neoplasm of digestive organs: Secondary | ICD-10-CM

## 2017-11-15 DIAGNOSIS — E8770 Fluid overload, unspecified: Secondary | ICD-10-CM | POA: Diagnosis not present

## 2017-11-15 DIAGNOSIS — R609 Edema, unspecified: Secondary | ICD-10-CM | POA: Diagnosis not present

## 2017-11-15 DIAGNOSIS — Z86010 Personal history of colonic polyps: Secondary | ICD-10-CM

## 2017-11-15 DIAGNOSIS — I083 Combined rheumatic disorders of mitral, aortic and tricuspid valves: Secondary | ICD-10-CM | POA: Diagnosis not present

## 2017-11-15 DIAGNOSIS — Z96652 Presence of left artificial knee joint: Secondary | ICD-10-CM | POA: Diagnosis not present

## 2017-11-15 HISTORY — PX: LEFT HEART CATH AND CORONARY ANGIOGRAPHY: CATH118249

## 2017-11-15 LAB — SPIROMETRY WITH GRAPH
FEF 25-75 Pre: 2.7 L/sec
FEF2575-%Pred-Pre: 127 %
FEV1-%Pred-Pre: 88 %
FEV1-Pre: 2.54 L
FEV1FVC-%Pred-Pre: 112 %
FEV6-%Pred-Pre: 83 %
FEV6-Pre: 3.09 L
FEV6FVC-%Pred-Pre: 106 %
FVC-%Pred-Pre: 78 %
FVC-Pre: 3.09 L
Pre FEV1/FVC ratio: 82 %
Pre FEV6/FVC Ratio: 100 %

## 2017-11-15 LAB — PROTIME-INR
INR: 1.02
Prothrombin Time: 13.3 seconds (ref 11.4–15.2)

## 2017-11-15 LAB — COMPREHENSIVE METABOLIC PANEL
ALT: 36 U/L (ref 0–44)
AST: 33 U/L (ref 15–41)
Albumin: 3.9 g/dL (ref 3.5–5.0)
Alkaline Phosphatase: 70 U/L (ref 38–126)
Anion gap: 9 (ref 5–15)
BUN: 12 mg/dL (ref 8–23)
CO2: 22 mmol/L (ref 22–32)
Calcium: 9 mg/dL (ref 8.9–10.3)
Chloride: 106 mmol/L (ref 98–111)
Creatinine, Ser: 0.86 mg/dL (ref 0.61–1.24)
GFR calc Af Amer: >60 mL/min (ref >60)
GFR calc non Af Amer: >60 mL/min (ref >60)
Glucose, Bld: 112 mg/dL — ABNORMAL HIGH (ref 70–99)
Potassium: 3.9 mmol/L (ref 3.5–5.1)
Sodium: 137 mmol/L (ref 135–145)
Total Bilirubin: 0.7 mg/dL (ref 0.3–1.2)
Total Protein: 6.9 g/dL (ref 6.5–8.1)

## 2017-11-15 LAB — URINALYSIS, ROUTINE W REFLEX MICROSCOPIC
Bilirubin Urine: NEGATIVE
Glucose, UA: NEGATIVE mg/dL
Hgb urine dipstick: NEGATIVE
Ketones, ur: NEGATIVE mg/dL
Leukocytes, UA: NEGATIVE
Nitrite: NEGATIVE
Protein, ur: NEGATIVE mg/dL
Specific Gravity, Urine: 1.02 (ref 1.005–1.030)
pH: 6 (ref 5.0–8.0)

## 2017-11-15 LAB — CBC
HEMATOCRIT: 51.4 % (ref 39.0–52.0)
HEMOGLOBIN: 16.5 g/dL (ref 13.0–17.0)
MCH: 32.4 pg (ref 26.0–34.0)
MCHC: 32.1 g/dL (ref 30.0–36.0)
MCV: 100.8 fL — ABNORMAL HIGH (ref 80.0–100.0)
NRBC: 0 % (ref 0.0–0.2)
Platelets: 264 10*3/uL (ref 150–400)
RBC: 5.1 MIL/uL (ref 4.22–5.81)
RDW: 12.1 % (ref 11.5–15.5)
WBC: 7.9 10*3/uL (ref 4.0–10.5)

## 2017-11-15 LAB — TYPE AND SCREEN
ABO/RH(D): A NEG
Antibody Screen: NEGATIVE

## 2017-11-15 LAB — HEMOGLOBIN A1C
Hgb A1c MFr Bld: 5.4 % (ref 4.8–5.6)
Mean Plasma Glucose: 108.28 mg/dL

## 2017-11-15 SURGERY — LEFT HEART CATH AND CORONARY ANGIOGRAPHY
Anesthesia: LOCAL

## 2017-11-15 MED ORDER — TEMAZEPAM 15 MG PO CAPS
15.0000 mg | ORAL_CAPSULE | Freq: Once | ORAL | Status: AC | PRN
  Administered 2017-11-15: 15 mg via ORAL
  Filled 2017-11-15: qty 1

## 2017-11-15 MED ORDER — SODIUM CHLORIDE 0.9% FLUSH
3.0000 mL | Freq: Two times a day (BID) | INTRAVENOUS | Status: DC
  Administered 2017-11-15: 3 mL via INTRAVENOUS

## 2017-11-15 MED ORDER — MILRINONE LACTATE IN DEXTROSE 20-5 MG/100ML-% IV SOLN
0.3000 ug/kg/min | INTRAVENOUS | Status: DC
  Filled 2017-11-15: qty 100

## 2017-11-15 MED ORDER — MIDAZOLAM HCL 2 MG/2ML IJ SOLN
INTRAMUSCULAR | Status: AC
  Filled 2017-11-15: qty 2

## 2017-11-15 MED ORDER — TRANEXAMIC ACID 1000 MG/10ML IV SOLN
1.5000 mg/kg/h | INTRAVENOUS | Status: AC
  Administered 2017-11-16: 1.5 mg/kg/h via INTRAVENOUS
  Filled 2017-11-15: qty 25

## 2017-11-15 MED ORDER — AMLODIPINE BESYLATE 5 MG PO TABS
5.0000 mg | ORAL_TABLET | Freq: Every day | ORAL | Status: DC
  Administered 2017-11-15: 5 mg via ORAL
  Filled 2017-11-15: qty 1

## 2017-11-15 MED ORDER — SODIUM CHLORIDE 0.9 % WEIGHT BASED INFUSION
3.0000 mL/kg/h | INTRAVENOUS | Status: DC
  Administered 2017-11-15: 3 mL/kg/h via INTRAVENOUS

## 2017-11-15 MED ORDER — DEXMEDETOMIDINE HCL IN NACL 400 MCG/100ML IV SOLN
0.1000 ug/kg/h | INTRAVENOUS | Status: AC
  Administered 2017-11-16: .5 ug/kg/h via INTRAVENOUS
  Filled 2017-11-15: qty 100

## 2017-11-15 MED ORDER — PLASMA-LYTE 148 IV SOLN
INTRAVENOUS | Status: DC
  Filled 2017-11-15: qty 2.5

## 2017-11-15 MED ORDER — TRANEXAMIC ACID (OHS) PUMP PRIME SOLUTION
2.0000 mg/kg | INTRAVENOUS | Status: DC
  Filled 2017-11-15: qty 1.77

## 2017-11-15 MED ORDER — MIDAZOLAM HCL 2 MG/2ML IJ SOLN
INTRAMUSCULAR | Status: DC | PRN
  Administered 2017-11-15: 1 mg via INTRAVENOUS
  Administered 2017-11-15: 2 mg via INTRAVENOUS

## 2017-11-15 MED ORDER — SODIUM CHLORIDE 0.9 % IV SOLN
INTRAVENOUS | Status: DC
  Filled 2017-11-15: qty 30

## 2017-11-15 MED ORDER — HEPARIN (PORCINE) IN NACL 1000-0.9 UT/500ML-% IV SOLN
INTRAVENOUS | Status: DC | PRN
  Administered 2017-11-15 (×2): 500 mL

## 2017-11-15 MED ORDER — HEPARIN (PORCINE) IN NACL 1000-0.9 UT/500ML-% IV SOLN
INTRAVENOUS | Status: AC
  Filled 2017-11-15: qty 1000

## 2017-11-15 MED ORDER — DIAZEPAM 5 MG PO TABS
5.0000 mg | ORAL_TABLET | Freq: Four times a day (QID) | ORAL | Status: DC | PRN
  Administered 2017-11-16: 5 mg via ORAL
  Filled 2017-11-15: qty 1

## 2017-11-15 MED ORDER — HEPARIN SODIUM (PORCINE) 5000 UNIT/ML IJ SOLN
5000.0000 [IU] | Freq: Three times a day (TID) | INTRAMUSCULAR | Status: DC
  Filled 2017-11-15: qty 1

## 2017-11-15 MED ORDER — INSULIN REGULAR(HUMAN) IN NACL 100-0.9 UT/100ML-% IV SOLN
INTRAVENOUS | Status: AC
  Administered 2017-11-16: 1.1 [IU]/h via INTRAVENOUS
  Filled 2017-11-15: qty 100

## 2017-11-15 MED ORDER — CHLORHEXIDINE GLUCONATE 0.12 % MT SOLN
15.0000 mL | Freq: Once | OROMUCOSAL | Status: AC
  Administered 2017-11-16: 15 mL via OROMUCOSAL
  Filled 2017-11-15: qty 15

## 2017-11-15 MED ORDER — DOPAMINE-DEXTROSE 3.2-5 MG/ML-% IV SOLN
0.0000 ug/kg/min | INTRAVENOUS | Status: DC
  Filled 2017-11-15: qty 250

## 2017-11-15 MED ORDER — ASPIRIN 81 MG PO CHEW: 81.0000 mg | CHEWABLE_TABLET | ORAL | Status: DC

## 2017-11-15 MED ORDER — VERAPAMIL HCL 2.5 MG/ML IV SOLN
INTRAVENOUS | Status: AC
  Filled 2017-11-15: qty 2

## 2017-11-15 MED ORDER — VANCOMYCIN HCL 10 G IV SOLR
1500.0000 mg | INTRAVENOUS | Status: AC
  Administered 2017-11-16: 1500 mg via INTRAVENOUS
  Filled 2017-11-15: qty 1500

## 2017-11-15 MED ORDER — CHLORHEXIDINE GLUCONATE CLOTH 2 % EX PADS
6.0000 | MEDICATED_PAD | Freq: Once | CUTANEOUS | Status: AC
  Administered 2017-11-15: 6 via TOPICAL

## 2017-11-15 MED ORDER — EPINEPHRINE PF 1 MG/ML IJ SOLN
0.0000 ug/min | INTRAVENOUS | Status: DC
  Filled 2017-11-15: qty 4

## 2017-11-15 MED ORDER — FENTANYL CITRATE (PF) 100 MCG/2ML IJ SOLN
INTRAMUSCULAR | Status: AC
  Filled 2017-11-15: qty 2

## 2017-11-15 MED ORDER — ASPIRIN 81 MG PO CHEW: 81.0000 mg | CHEWABLE_TABLET | Freq: Every day | ORAL | Status: DC

## 2017-11-15 MED ORDER — CHLORHEXIDINE GLUCONATE CLOTH 2 % EX PADS
6.0000 | MEDICATED_PAD | Freq: Once | CUTANEOUS | Status: AC
  Administered 2017-11-16: 6 via TOPICAL

## 2017-11-15 MED ORDER — SODIUM CHLORIDE 0.9 % IV SOLN: 250.0000 mL | INTRAVENOUS | Status: DC | PRN

## 2017-11-15 MED ORDER — ACETAMINOPHEN 325 MG PO TABS
ORAL_TABLET | ORAL | Status: AC
  Filled 2017-11-15: qty 2

## 2017-11-15 MED ORDER — NITROGLYCERIN IN D5W 200-5 MCG/ML-% IV SOLN
2.0000 ug/min | INTRAVENOUS | Status: DC
  Filled 2017-11-15: qty 250

## 2017-11-15 MED ORDER — SODIUM CHLORIDE 0.9 % IV SOLN
1.5000 g | INTRAVENOUS | Status: AC
  Administered 2017-11-16: .75 g via INTRAVENOUS
  Administered 2017-11-16: 1.5 g via INTRAVENOUS
  Filled 2017-11-15: qty 1.5

## 2017-11-15 MED ORDER — VERAPAMIL HCL 2.5 MG/ML IV SOLN
INTRAVENOUS | Status: DC | PRN
  Administered 2017-11-15 (×2): 10 mL via INTRA_ARTERIAL

## 2017-11-15 MED ORDER — METOPROLOL TARTRATE 12.5 MG HALF TABLET
12.5000 mg | ORAL_TABLET | Freq: Two times a day (BID) | ORAL | Status: DC
  Administered 2017-11-15 (×2): 12.5 mg via ORAL
  Filled 2017-11-15 (×2): qty 1

## 2017-11-15 MED ORDER — ONDANSETRON HCL 4 MG/2ML IJ SOLN
4.0000 mg | Freq: Four times a day (QID) | INTRAMUSCULAR | Status: DC | PRN
  Administered 2017-11-16: 4 mg via INTRAVENOUS

## 2017-11-15 MED ORDER — FENTANYL CITRATE (PF) 100 MCG/2ML IJ SOLN
INTRAMUSCULAR | Status: DC | PRN
  Administered 2017-11-15 (×2): 25 ug via INTRAVENOUS

## 2017-11-15 MED ORDER — PHENYLEPHRINE HCL-NACL 20-0.9 MG/250ML-% IV SOLN
30.0000 ug/min | INTRAVENOUS | Status: DC
  Filled 2017-11-15: qty 250

## 2017-11-15 MED ORDER — LIDOCAINE HCL (PF) 1 % IJ SOLN
INTRAMUSCULAR | Status: DC | PRN
  Administered 2017-11-15: 2 mL via INTRADERMAL
  Administered 2017-11-15: 15 mL via INTRADERMAL

## 2017-11-15 MED ORDER — IOHEXOL 350 MG/ML SOLN
INTRAVENOUS | Status: DC | PRN
  Administered 2017-11-15: 75 mL via INTRA_ARTERIAL

## 2017-11-15 MED ORDER — SODIUM CHLORIDE 0.9% FLUSH: 3.0000 mL | INTRAVENOUS | Status: DC | PRN

## 2017-11-15 MED ORDER — TRANEXAMIC ACID (OHS) BOLUS VIA INFUSION
15.0000 mg/kg | INTRAVENOUS | Status: AC
  Administered 2017-11-16: 1327.5 mg via INTRAVENOUS
  Filled 2017-11-15: qty 1328

## 2017-11-15 MED ORDER — SODIUM CHLORIDE 0.9 % IV SOLN
750.0000 mg | INTRAVENOUS | Status: DC
  Filled 2017-11-15: qty 750

## 2017-11-15 MED ORDER — BISACODYL 5 MG PO TBEC
5.0000 mg | DELAYED_RELEASE_TABLET | Freq: Once | ORAL | Status: AC
  Administered 2017-11-15: 5 mg via ORAL
  Filled 2017-11-15: qty 1

## 2017-11-15 MED ORDER — MAGNESIUM SULFATE 50 % IJ SOLN
40.0000 meq | INTRAMUSCULAR | Status: DC
  Filled 2017-11-15: qty 9.85

## 2017-11-15 MED ORDER — ACETAMINOPHEN 325 MG PO TABS
650.0000 mg | ORAL_TABLET | ORAL | Status: DC | PRN
  Administered 2017-11-15 (×2): 650 mg via ORAL
  Filled 2017-11-15: qty 2

## 2017-11-15 MED ORDER — SODIUM CHLORIDE 0.9 % WEIGHT BASED INFUSION: 1.0000 mL/kg/h | INTRAVENOUS | Status: DC

## 2017-11-15 MED ORDER — METOPROLOL TARTRATE 12.5 MG HALF TABLET
12.5000 mg | ORAL_TABLET | Freq: Once | ORAL | Status: AC
  Administered 2017-11-16: 12.5 mg via ORAL
  Filled 2017-11-15: qty 1

## 2017-11-15 MED ORDER — POTASSIUM CHLORIDE 2 MEQ/ML IV SOLN
80.0000 meq | INTRAVENOUS | Status: DC
  Filled 2017-11-15: qty 40

## 2017-11-15 MED ORDER — SODIUM CHLORIDE 0.9 % IV SOLN
INTRAVENOUS | Status: DC
  Administered 2017-11-15 – 2017-11-16 (×2): via INTRAVENOUS

## 2017-11-15 MED ORDER — LIDOCAINE HCL (PF) 1 % IJ SOLN
INTRAMUSCULAR | Status: AC
  Filled 2017-11-15: qty 30

## 2017-11-15 SURGICAL SUPPLY — 13 items
CATH INFINITI 5FR MULTPACK ANG (CATHETERS) ×1 IMPLANT
CATH OPTITORQUE TIG 4.0 5F (CATHETERS) ×1 IMPLANT
DEVICE RAD COMP TR BAND LRG (VASCULAR PRODUCTS) ×1 IMPLANT
GLIDESHEATH SLEND SS 6F .021 (SHEATH) ×1 IMPLANT
GUIDEWIRE INQWIRE 1.5J.035X260 (WIRE) IMPLANT
INQWIRE 1.5J .035X260CM (WIRE) ×2
KIT HEART LEFT (KITS) ×2 IMPLANT
PACK CARDIAC CATHETERIZATION (CUSTOM PROCEDURE TRAY) ×2 IMPLANT
SHEATH PINNACLE 5F 10CM (SHEATH) ×1 IMPLANT
TRANSDUCER W/STOPCOCK (MISCELLANEOUS) ×2 IMPLANT
TUBING CIL FLEX 10 FLL-RA (TUBING) ×2 IMPLANT
WIRE EMERALD 3MM-J .035X150CM (WIRE) ×1 IMPLANT
WIRE HI TORQ VERSACORE-J 145CM (WIRE) ×1 IMPLANT

## 2017-11-15 NOTE — Progress Notes (Signed)
Site area: rt groin fa sheath Site Prior to Removal:  Level 0 Pressure Applied For:  20 minutes Manual:    yes Patient Status During Pull:  stable Post Pull Site:  Level  0 Post Pull Instructions Given:  yes Post Pull Pulses Present: rt dp palpable Dressing Applied:  Gauze and tegaderm Bedrest begins @ 1110 Comments:   

## 2017-11-15 NOTE — Research (Signed)
CADEFEM Informed Consent   Subject Name: Carl [name]  Subject met inclusion and exclusion criteria.  The informed consent form, study requirements and expectations were reviewed with the subject and questions and concerns were addressed prior to the signing of the consent form.  The subject verbalized understanding of the trail requirements.  The subject agreed to participate in the CADFEM trial and signed the informed consent.  The informed consent was obtained prior to performance of any protocol-specific procedures for the subject.  A copy of the signed informed consent was given to the subject and a copy was placed in the subject's medical record.  [name] 11/15/2017, 11:30 AM

## 2017-11-15 NOTE — Progress Notes (Signed)
Received report; Pt. C/o R shoulder pain- "3/10" tylenol given- will monitor. RFA site level 0, r radial band intact with r hand neurovascular status intact . R dp + 1; family at bedside. Pt. Eating lunch now with family at bedside. R upper extremity elevated upon a pillow. Awaiting bed assignment.

## 2017-11-15 NOTE — Consult Note (Addendum)
[address]       [place] [address]             [phone]        Dakotah D Gal Sorrento Medical Record #[id number] Date of Birth: 1946-02-06  Referring: Shelva Majestic, MD Primary Care: Hulan Fess, MD Primary Cardiologist:Thomas Claiborne Billings, MD  Chief Complaint:   Left Main and 3 vessel cad   History of Present Illness:       Carl [name] is a 72 year old male patient with a past medical history of hypertension, GERD, diverticulitis, hyperlipidemia, depression, anxiety, and alcohol use who presented to his cardiologist's office with complaints of right-sided shoulder pain and numbness on the right side of his face and right arm.  He recently returned from [place] where he was very active and did notice some shortness of breath.  He never did have any chest pain.  He has been followed by [name] for several years.  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  The patient currently denies any chest pain.  He does have chronic pain which radiates down both lower legs.  He did have bilateral carotid ultrasounds back in 2018 which showed mild carotid atherosclerosis.  The degree of narrowing was less than 50% bilaterally.  He will need an arterial study of his lower extremity to ensure adequate blood flow for vein harvesting.  He has remained very active over the past few years and is a non-smoker.  He denies diabetes mellitus and previous stroke. We are consulted for possible surgical revascularization.    Current Activity/ Functional Status: Patient is independent with mobility/ambulation, transfers, ADL's, IADL's.   Zubrod Score: At the time of surgery this patient's most appropriate activity status/level should be described as: []     0    Normal activity, no symptoms [x]     1    Restricted in physical strenuous activity but ambulatory, able to do out light work []     2    Ambulatory and capable of self  care, unable to do work activities, up and about                 more than 50%  Of the time                            []     3    Only limited self care, in bed greater than 50% of waking hours []     4    Completely disabled, no self care, confined to bed or chair []     5    Moribund  Past Medical History:  Diagnosis Date  . Anxiety    takes Xanax daily as needed  . Arthritis   . Cataract    removed both eyes  . Depression    takes Celexa daily  . Diverticulitis   . Diverticulosis   . Enlarged prostate    sightly  . Esophagitis   . Gastric ulcer   . GERD (gastroesophageal reflux disease)    takes Protonix daily  . Hx of adenomatous colonic polyps    benign  . Hyperlipidemia    takes Zocor daily  . Insomnia    takes Ambien nightly  . Joint pain   . Joint swelling     Past Surgical History:  Procedure Laterality Date  . cartliage removed from nose  57yrs ago  . cataract surgery Bilateral   . CERVICAL SPINE SURGERY    . COLONOSCOPY    . FOREIGN BODY REMOVAL ESOPHAGEAL    . KNEE SURGERY Left    couple of times  . NASAL SINUS SURGERY    . POLYPECTOMY    . TONSILLECTOMY    . TOTAL KNEE ARTHROPLASTY Left 06/17/2014   Procedure: TOTAL KNEE ARTHROPLASTY;  Surgeon: Garald Balding, MD;  Location: [facility];  Service: Orthopedics;  Laterality: Left;    Social History   Tobacco Use  Smoking Status Never Smoker  Smokeless Tobacco Never Used    Social History   Substance and Sexual Activity  Alcohol Use Yes  . Alcohol/week: 10.0 standard drinks  . Types: 10 Standard drinks or equivalent per week   Comment: 2 drinks 5 nights per week     No Known Allergies  Current Facility-Administered Medications  Medication Dose Route Frequency Provider Last Rate Last Dose  . 0.9 %  sodium chloride infusion  250 mL Intravenous PRN Troy Sine, MD      . 0.9 %  sodium chloride infusion   Intravenous Continuous Troy Sine, MD 125 mL/hr at 11/15/17 1120    . 0.9% sodium  chloride infusion  1 mL/kg/hr Intravenous Continuous Troy Sine, MD 88.9 mL/hr at 11/15/17 0950 1 mL/kg/hr at 11/15/17 0950  . acetaminophen (TYLENOL) tablet 650 mg  650 mg Oral Q4H PRN Troy Sine, MD      . amLODipine (NORVASC) tablet 5 mg  5 mg Oral Daily Troy Sine, MD      . aspirin chewable tablet 81 mg  81 mg Oral Emelia Salisbury, MD      . metoprolol tartrate (LOPRESSOR) tablet 12.5 mg  12.5 mg Oral BID Troy Sine, MD      . sodium chloride flush (NS) 0.9 % injection 3 mL  3 mL Intravenous Q12H Shelva Majestic A, MD      . sodium chloride flush (NS) 0.9 % injection 3 mL  3 mL Intravenous PRN Troy Sine, MD        Facility-Administered Medications Prior to Admission  Medication Dose Route Frequency Provider Last Rate Last Dose  . 0.9 %  sodium chloride infusion  500 mL Intravenous Once Irene Shipper, MD       Medications Prior to Admission  Medication Sig Dispense Refill Last Dose  . acyclovir (ZOVIRAX) 400 MG tablet Take 400 mg by mouth 2 (two) times daily.   11/15/2017 at Unknown time  . amLODipine (NORVASC) 5 MG tablet Take 1 tablet (5 mg total) by mouth daily. 90 tablet 3 11/15/2017 at Unknown time  . aspirin EC 81 MG tablet Take 1 tablet (81 mg total) by mouth daily. 90 tablet 3 11/15/2017 at Unknown time  . ciprofloxacin (CIPRO) 500 MG tablet TAKE 1 TABLET(500 MG) BY MOUTH TWICE DAILY (Patient taking differently: Take 500 mg by mouth 2 (two) times daily as needed (for diverticulitis flare ups). TAKE 1 TABLET(500 MG) BY MOUTH TWICE DAILY) 20 tablet 0 Taking  . ciprofloxacin-dexamethasone (CIPRODEX) OTIC suspension Place 4 drops into both ears daily.     . citalopram (CELEXA) 20 MG tablet Take 20 mg by mouth daily.   11/15/2017 at Unknown time  . Collagen-Boron-Hyaluronic Acid ([employer] JOINT HEALTH TRIPLE ACTION PO) Take 1 tablet by mouth daily.   11/15/2017 at Unknown time  . Magnesium Carbonate POWD Take 4 g by mouth daily. Natural Vitality  Calm   11/14/2017 at  Unknown time  . metoprolol tartrate (LOPRESSOR) 50 MG tablet TAKE 1 TABLET BY MOUTH 1 HOUR PRIOR TO CT 90 tablet 0 11/14/2017 at Unknown time  . Multiple Vitamins-Minerals (PRESERVISION AREDS 2 PO) Take 1 tablet by mouth 2 (two) times daily.   11/15/2017 at Unknown time  . nitroGLYCERIN (NITROSTAT) 0.4 MG SL tablet Place 1 tablet (0.4 mg total) under the tongue every 5 (five) minutes as needed for chest pain. 25 tablet 3   . pantoprazole (PROTONIX) 20 MG tablet TAKE 2 TABLETS(40 MG) BY MOUTH DAILY (Patient taking differently: Take 40 mg by mouth daily. TAKE 2 TABLETS(40 MG) BY MOUTH DAILY) 8 tablet 0 11/15/2017 at Unknown time  . simvastatin (ZOCOR) 40 MG tablet Take 40 mg by mouth every evening.   11/14/2017 at Unknown time  . zolpidem (AMBIEN) 10 MG tablet Take 10 mg by mouth at bedtime.    11/14/2017 at Unknown time  . ALPRAZolam (XANAX) 0.25 MG tablet Take 0.25 mg by mouth daily as needed for anxiety.   0 More than a month at Unknown time  . metroNIDAZOLE (FLAGYL) 500 MG tablet Take 1 tablet (500 mg total) by mouth 2 (two) times daily. (Patient not taking: Reported on 11/14/2017) 20 tablet 0 Not Taking at Unknown time    Family History  Problem Relation Age of Onset  . Colon cancer Paternal Grandfather        questionable  . Colon polyps Neg Hx   . Esophageal cancer Neg Hx   . Rectal cancer Neg Hx   . Stomach cancer Neg Hx      Review of Systems:   Review of Systems  Constitutional: Negative for chills and fever.  Respiratory: Positive for shortness of breath. Negative for cough and wheezing.   Cardiovascular: Negative for chest pain, palpitations and leg swelling.  Gastrointestinal: Negative for abdominal pain, nausea and vomiting.  Neurological: Positive for sensory change.  Psychiatric/Behavioral: Positive for depression. The patient is nervous/anxious.    Pertinent items are noted in HPI.                 Physical Exam: BP (!) 163/67   Pulse 64   Temp 98.1 F (36.7 C)   Resp  12   Ht 5\' 8"  (1.727 m)   Wt 88.5 kg   SpO2 98%   BMI 29.65 kg/m    General appearance: alert, cooperative and no distress Resp: clear to auscultation bilaterally Cardio: regular rate and rhythm, S1, S2 normal, no murmur, click, rub or gallop GI: soft, non-tender; bowel sounds normal; no masses,  no organomegaly Extremities: extremities normal, atraumatic, no cyanosis or edema Neurologic: Grossly normal Right radial and rt femoral cath sites intact without bleeding   Diagnostic Studies & Laboratory data:     Recent Radiology Findings:  Ct Coronary Morph W/cta Cor W/score W/ca W/cm &/or Wo/cm  Addendum Date: 11/09/2017   ADDENDUM REPORT: 11/09/2017 16:10 CLINICAL DATA:  Chest pain EXAM: Cardiac CTA MEDICATIONS: Sub lingual nitro. 4mg  and lopressor 5mg  TECHNIQUE: The patient was scanned on a [employer]. Gantry rotation speed was 270 msecs. Collimation was.9 mm. A 100 kV prospective scan was triggered in the descending thoracic aorta at 111 HU's with 5% padding centered around 78% of the R-R interval. Average HR during the scan was 62 bpm. The 3D data set was interpreted on a dedicated work station using MPR, MIP and VRT modes. A total of 80 cc of contrast was used.  FINDINGS: Non-cardiac: See separate report from [facility]. No significant findings on limited lung and soft tissue windows. Calcium Score: Calcium noted in all 3 major epicardial coronary vessels Coronary Arteries: Right dominant with no anomalies LM: Normal LAD: > 75% ostial and mid vessel calcific plaque. Less than 50% distal calcific plaque IM: Small vessel not well seen D1: Greater than 75% calcific plaque proximally and mid vessel D2: Normal Circumflex: Less than 50% proximal plaque OM1: Less than 50% proximal plaque OM2: 50-75% mixed plaque in AV groove RCA: 50% calcified plaque proximally 50-75% calcified plaque in mid vessel PDA: Normal PLA: Normal IMPRESSION: 1. Calcium score 825 which is 53 th percentile  for age and sex 2. Obstructive CAD see description above Study will be sent for FFR CT but patient will likely need heart catheterization 3.  Mild aortic root dilatation 4.1 cm Jenkins Rouge Electronically Signed   By: Jenkins Rouge M.D.   On: 11/09/2017 16:10   Result Date: 11/09/2017 EXAM: OVER-READ INTERPRETATION  CT CHEST The following report is an over-read performed by radiologist Dr. Rolm Baptise of [facility], [place] on 11/09/2017. This over-read does not include interpretation of cardiac or coronary anatomy or pathology. The coronary CTA interpretation by the cardiologist is attached. COMPARISON:  None. FINDINGS: Vascular: Heart is normal size.  Visualized aorta is normal caliber. Mediastinum/Nodes: No adenopathy in the lower mediastinum or hila. Lungs/Pleura: Dependent atelectasis in the lower lobes. No effusions. Upper Abdomen: Imaging into the upper abdomen shows no acute findings. Musculoskeletal: Chest wall soft tissues are unremarkable. No acute bony abnormality. IMPRESSION: No acute or significant extracardiac abnormality. Electronically Signed: By: Rolm Baptise M.D. On: 11/09/2017 15:46   Ct Coronary Fractional Flow Reserve Data Prep  Result Date: 11/10/2017 CLINICAL DATA:  CAD EXAM: FFR CT TECHNIQUE: The best systolic and diastolic phases of the patients gated cardiac CT scan sent to Heart Flow for hemodynamic analysis FINDINGS: FFR CT positive in mid RCA .81 mid LAD .78 and AV groove .71 IMPRESSION: Positive FFR CT in mid RCA and mid LAD AV groove branch small and not likely significant Jenkins Rouge Electronically Signed   By: Jenkins Rouge M.D.   On: 11/10/2017 09:23     I have independently reviewed the above radiologic studies and discussed with the patient   Recent Lab Findings: Lab Results  Component Value Date   WBC 6.2 11/14/2017   HGB 15.8 11/14/2017   HCT 45.6 11/14/2017   PLT 264 11/14/2017   GLUCOSE 95 11/14/2017   CHOL 170 11/14/2017   TRIG 231 (H) 11/14/2017    HDL 48 11/14/2017   LDLCALC 76 11/14/2017   ALT 36 11/14/2017   AST 37 11/14/2017   NA 140 11/14/2017   K 5.1 11/14/2017   CL 100 11/14/2017   CREATININE 0.87 11/14/2017   BUN 14 11/14/2017   CO2 24 11/14/2017   TSH 5.180 (H) 11/14/2017   INR 0.93 06/17/2014   HGBA1C  01/29/2007    5.4 (NOTE)   The ADA recommends the following therapeutic goals for glycemic   control related to Hgb A1C measurement:   Goal of Therapy:   < 7.0% Hgb A1C   Action Suggested:  > 8.0% Hgb A1C   Ref:  Diabetes Care, 22, Suppl. 1, 1999  CATH: Cath report nor complete  But with 50- 56% rca , 100 % distal cx, 70-80 om1 and  om2 . 85 proximal lad lv function intact  ECHO: Transthoracic Echocardiography  Patient:  Nasean, Zapf MR #:       [id number] Study Date: 10/26/2017 Gender:     M Age:        3 Height:     172.7 cm Weight:     88.5 kg BSA:        2.08 m^2 Pt. Status: Room:   ATTENDING    Shelva Majestic, M.D.  ORDERING     Shelva Majestic, M.D.  REFERRING    Shelva Majestic, M.D.  SONOGRAPHER  Marygrace Drought, RCS  PERFORMING   [facility], Outpatient  cc:  ------------------------------------------------------------------- LV EF: 65% -   70%  ------------------------------------------------------------------- Indications:      DOE (R06.09).  ------------------------------------------------------------------- History:   PMH:  HLD.  ------------------------------------------------------------------- Study Conclusions  - Left ventricle: The cavity size was normal. There was mild   concentric hypertrophy. Systolic function was vigorous. The   estimated ejection fraction was in the range of 65% to 70%. Wall   motion was normal; there were no regional wall motion   abnormalities. Features are consistent with a pseudonormal left   ventricular filling pattern, with concomitant abnormal relaxation   and increased filling pressure (grade 2 diastolic dysfunction).   Doppler parameters are consistent  with elevated ventricular   end-diastolic filling pressure. - Aortic valve: There was no regurgitation. - Mitral valve: There was mild regurgitation. - Left atrium: The atrium was mildly dilated. - Right ventricle: Systolic function was normal. - Right atrium: The atrium was normal in size. - Tricuspid valve: There was mild regurgitation. - Pulmonary arteries: Systolic pressure was within the normal   range. - Inferior vena cava: The vessel was normal in size. The   respirophasic diameter changes were in the normal range (= 50%),   consistent with normal central venous pressure. - Pericardium, extracardiac: There was no pericardial effusion.  ------------------------------------------------------------------- Study data:  The previous study was not available, so comparison was made to the report of 01/29/2007.  Study status:  Routine. Procedure:  The patient reported no pain pre or post test. Transthoracic echocardiography. Image quality was adequate.  Transthoracic echocardiography.  M-mode, complete 2D, spectral Doppler, and color Doppler.  Birthdate:  Patient birthdate: 1945/07/04.  Age:  Patient is 72 yr old.  Sex:  Gender: male. BMI: 29.7 kg/m^2.  Blood pressure:     124/91  Patient status: Outpatient.  Study date:  Study date: 10/26/2017. Study time: 03:01 PM.  Location:  [facility]  -------------------------------------------------------------------  ------------------------------------------------------------------- Left ventricle:  The cavity size was normal. There was mild concentric hypertrophy. Systolic function was vigorous. The estimated ejection fraction was in the range of 65% to 70%. Wall motion was normal; there were no regional wall motion abnormalities. Features are consistent with a pseudonormal left ventricular filling pattern, with concomitant abnormal relaxation and increased filling pressure (grade 2 diastolic dysfunction). Doppler parameters  are consistent with elevated ventricular end-diastolic filling pressure.  ------------------------------------------------------------------- Aortic valve:   Trileaflet; normal thickness leaflets. Mobility was not restricted.  Doppler:  Transvalvular velocity was within the normal range. There was no stenosis. There was no regurgitation.   ------------------------------------------------------------------- Aorta:  Aortic root: The aortic root was normal in size.  ------------------------------------------------------------------- Mitral valve:   Structurally normal valve.   Mobility was not restricted.  Doppler:  Transvalvular velocity was within the normal range. There was no evidence for stenosis. There was mild regurgitation.    Peak gradient (D): 3 mm Hg.  ------------------------------------------------------------------- Left atrium:  The atrium was mildly dilated.  ------------------------------------------------------------------- Right  ventricle:  The cavity size was normal. Wall thickness was normal. Systolic function was normal.  ------------------------------------------------------------------- Pulmonic valve:    Doppler:  Transvalvular velocity was within the normal range. There was no evidence for stenosis.  ------------------------------------------------------------------- Tricuspid valve:   Structurally normal valve.    Doppler: Transvalvular velocity was within the normal range. There was mild regurgitation.  ------------------------------------------------------------------- Pulmonary artery:   The main pulmonary artery was normal-sized. Systolic pressure was within the normal range.  ------------------------------------------------------------------- Right atrium:  The atrium was normal in size.  ------------------------------------------------------------------- Pericardium:  There was no pericardial  effusion.  ------------------------------------------------------------------- Systemic veins: Inferior vena cava: The vessel was normal in size. The respirophasic diameter changes were in the normal range (= 50%), consistent with normal central venous pressure. Diameter: 15 mm.  ------------------------------------------------------------------- Measurements   IVC                                        Value        Reference  ID                                         15    mm     ----------    Left ventricle                             Value        Reference  LV ID, ED, PLAX chordal            (L)     41.4  mm     43 - 52  LV ID, ES, PLAX chordal                    25.7  mm     23 - 38  LV fx shortening, PLAX chordal             38    %      >=29  LV PW thickness, ED                        11.8  mm     ----------  IVS/LV PW ratio, ED                        1.03         <=1.3  Stroke volume, 2D                          91    ml     ----------  Stroke volume/bsa, 2D                      44    ml/m^2 ----------  LV e&', lateral                             7.4   cm/s   ----------  LV E/e&', lateral                           12.12        ----------  LV e&', medial                              6.96  cm/s   ----------  LV E/e&', medial                            12.89        ----------  LV e&', average                             7.18  cm/s   ----------  LV E/e&', average                           12.49        ----------    Ventricular septum                         Value        Reference  IVS thickness, ED                          12.2  mm     ----------    LVOT                                       Value        Reference  LVOT ID, S                                 21    mm     ----------  LVOT area                                  3.46  cm^2   ----------  LVOT peak velocity, S                      109   cm/s   ----------  LVOT mean velocity, S                      73.6  cm/s    ----------  LVOT VTI, S                                26.3  cm     ----------    Aortic valve                               Value        Reference  Aortic regurg pressure half-time           670   ms     ----------    Aorta                                      Value        Reference  Aortic root ID,  ED                         35    mm     ----------    Left atrium                                Value        Reference  LA ID, A-P, ES                             41    mm     ----------  LA ID/bsa, A-P                             1.97  cm/m^2 <=2.2  LA volume, S                               89.4  ml     ----------  LA volume/bsa, S                           42.9  ml/m^2 ----------  LA volume, ES, 1-p A4C                     87.9  ml     ----------  LA volume/bsa, ES, 1-p A4C                 42.2  ml/m^2 ----------  LA volume, ES, 1-p A2C                     85    ml     ----------  LA volume/bsa, ES, 1-p A2C                 40.8  ml/m^2 ----------    Mitral valve                               Value        Reference  Mitral E-wave peak velocity                89.7  cm/s   ----------  Mitral A-wave peak velocity                82.9  cm/s   ----------  Mitral deceleration time           (H)     349   ms     150 - 230  Mitral peak gradient, D                    3     mm Hg  ----------  Mitral E/A ratio, peak                     1.1          ----------    Pulmonary arteries                         Value        Reference  PA pressure, S, DP  27    mm Hg  <=30    Tricuspid valve                            Value        Reference  Tricuspid regurg peak velocity             243   cm/s   ----------  Tricuspid peak RV-RA gradient              24    mm Hg  ----------  Tricuspid maximal regurg velocity,         243   cm/s   ----------  PISA    Right atrium                               Value        Reference  RA ID, S-I, ES, A4C                (H)     62.4  mm     34 -  49  RA area, ES, A4C                   (H)     22.7  cm^2   8.3 - 19.5  RA volume, ES, A/L                         68.1  ml     ----------  RA volume/bsa, ES, A/L                     32.7  ml/m^2 ----------    Systemic veins                             Value        Reference  Estimated CVP                              3     mm Hg  ----------    Right ventricle                            Value        Reference  TAPSE                                      24.4  mm     ----------  RV pressure, S, DP                         27    mm Hg  <=30  RV s&', lateral, S                          14.5  cm/s   ----------  Legend: (L)  and  (H)  mark values outside specified reference range.  ------------------------------------------------------------------- Prepared and Electronically Authenticated by  Ena Dawley, M.D. 2019-09-19T15:56:08  Assessment / Plan:      1. Left main/3 vessel CAD-currently chest pain free and never had chest pain. Plan for CABG tomorrow  with Dr. Servando Snare. Continue medical optimization 2. Hypertension-currently on Norvasc and Lopressor. Will titrate medications accordingly. Avoid ACEI to preserve kidney function 3. Hyperlipidemia- he was taking simvastatin at home 4. Anxiety/Depression-continue home medications Xanax and Celexa 5. GERD- continue home Protonix 6. Alcohol use-10 drinks a week at home. Will monitor for possible CIWA protocol.  7. Bilateral lower extremity pain-bilateral vascular US ordered.   Plan: To the OR tomorrow for CABG x 3.   The goals risks and alternatives of the planned surgical procedure CABG   have been discussed with the patient in detail. The risks of the procedure including death, infection, stroke, myocardial infarction, bleeding, blood transfusion have all been discussed specifically.  I have quoted Carl [name] a 2 % of perioperative mortality and a complication rate as high as 30 %. The patient's questions have been answered.Carl [name] is willing  to proceed with the planned procedure.  Carl Isaac MD      [address] [place] [address] Office [phone]   Murlean Hark [phone]  11/15/2017 11:22 AM    Addendum: final cath report :  Prox RCA to Mid RCA lesion is 60% stenosed.  Mid RCA lesion is 50% stenosed.  Prox Cx to Mid Cx lesion is 100% stenosed.  Ost 2nd Mrg lesion is 80% stenosed.  2nd Mrg lesion is 90% stenosed.  Ost 1st Mrg to 1st Mrg lesion is 50% stenosed.  Dist LM to Ost LAD lesion is 85% stenosed.  Prox LAD to Mid LAD lesion is 75% stenosed.   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  LVEDP 12 mm.  Previous echo documentation of hyperdynamic LV function with an EF of 65 to 70%.  Suspect high right radial artery take-off with stenosis/spasm above the elbow and retrograde filling of brachial artery  RECOMMENDATION: Patient will be admitted following his cardiac catheterization.  I have discussed the catheterization findings with Dr. Servando Snare who will proceed with CABG revascularization surgery tomorrow.

## 2017-11-15 NOTE — Progress Notes (Signed)
Pre-CABG testing has been completed. Bi 1-39% ICA stenosis bilaterally.  ABI's Right 1.27 Left 1.33  Palmar waveforms Right Palmar waveforms are obliterated with radial and ulnar compression. Left Palmar waveforms remain within normal limits with radial compression and are obliterated with ulnar compression.  11/15/17 4:25 PM Carlos Levering RVT

## 2017-11-15 NOTE — Progress Notes (Addendum)
TR BAND REMOVAL  LOCATION:    Radial right radial  DEFLATED PER PROTOCOL:   yes  TIME BAND OFF / DRESSING APPLIED:    1410/gauze and tegaderm  SITE UPON ARRIVAL:    Level 0  SITE AFTER BAND REMOVAL:    Level 0  CIRCULATION SENSATION AND MOVEMENT:    Within Normal Limits : yes, rt and and fingers warm and pink, sensation present, rt arm elevated on pillow; instructions reviewed w/patient  COMMENTS:

## 2017-11-15 NOTE — Interval H&P Note (Signed)
Cath Lab Visit (complete for each Cath Lab visit)  Clinical Evaluation Leading to the Procedure:   ACS: No.  Non-ACS:    Anginal Classification: CCS II  Anti-ischemic medical therapy: Minimal Therapy (1 class of medications)  Non-Invasive Test Results: High-risk stress test findings: cardiac mortality >3%/year  Prior CABG: No previous CABG      History and Physical Interval Note:  11/15/2017 9:41 AM  Denice Bors  has presented today for surgery, with the diagnosis of abnormal ct  The various methods of treatment have been discussed with the patient and family. After consideration of risks, benefits and other options for treatment, the patient has consented to  Procedure(s): LEFT HEART CATH AND CORONARY ANGIOGRAPHY (N/A) as a surgical intervention .  The patient's history has been reviewed, patient examined, no change in status, stable for surgery.  I have reviewed the patient's chart and labs.  Questions were answered to the patient's satisfaction.     Shelva Majestic

## 2017-11-15 NOTE — Progress Notes (Signed)
CARDIAC REHAB PHASE I   Discussed sternal precautions, IS (2200-2500 mL in bed), mobility post op, and d/c planning. Good reception, friend present. Pt sts he will have people help him at d/c. Gave booklets and videos to watch.  [place], ACSM 11/15/2017 2:59 PM

## 2017-11-16 ENCOUNTER — Inpatient Hospital Stay (HOSPITAL_COMMUNITY): Payer: PPO | Admitting: Anesthesiology

## 2017-11-16 ENCOUNTER — Inpatient Hospital Stay (HOSPITAL_COMMUNITY): Payer: PPO

## 2017-11-16 ENCOUNTER — Encounter (HOSPITAL_COMMUNITY): Payer: Self-pay | Admitting: Anesthesiology

## 2017-11-16 ENCOUNTER — Encounter (HOSPITAL_COMMUNITY)
Admission: RE | Disposition: A | Discharge status: Home / Self Care | Payer: Self-pay | Source: Ambulatory Visit | Attending: Cardiothoracic Surgery

## 2017-11-16 DIAGNOSIS — Z951 Presence of aortocoronary bypass graft: Secondary | ICD-10-CM

## 2017-11-16 HISTORY — PX: TEE WITHOUT CARDIOVERSION: SHX5443

## 2017-11-16 HISTORY — PX: CORONARY ARTERY BYPASS GRAFT: SHX141

## 2017-11-16 LAB — POCT I-STAT 4, (NA,K, GLUC, HGB,HCT)
GLUCOSE: 121 mg/dL — AB (ref 70–99)
HEMATOCRIT: 37 % — AB (ref 39.0–52.0)
Hemoglobin: 12.6 g/dL — ABNORMAL LOW (ref 13.0–17.0)
Potassium: 4.1 mmol/L (ref 3.5–5.1)
SODIUM: 141 mmol/L (ref 135–145)

## 2017-11-16 LAB — POCT I-STAT, CHEM 8
BUN: 11 mg/dL (ref 8–23)
BUN: 10 mg/dL (ref 8–23)
BUN: 9 mg/dL (ref 8–23)
BUN: 10 mg/dL (ref 8–23)
BUN: 9 mg/dL (ref 8–23)
BUN: 11 mg/dL (ref 8–23)
CALCIUM ION: 1.22 mmol/L (ref 1.15–1.40)
CALCIUM ION: 1.02 mmol/L — AB (ref 1.15–1.40)
CALCIUM ION: 1.2 mmol/L (ref 1.15–1.40)
CALCIUM ION: 1.17 mmol/L (ref 1.15–1.40)
CHLORIDE: 104 mmol/L (ref 98–111)
CHLORIDE: 106 mmol/L (ref 98–111)
CREATININE: 0.7 mg/dL (ref 0.61–1.24)
CREATININE: 0.7 mg/dL (ref 0.61–1.24)
CREATININE: 0.7 mg/dL (ref 0.61–1.24)
Calcium, Ion: 1.09 mmol/L — ABNORMAL LOW (ref 1.15–1.40)
Calcium, Ion: 1.13 mmol/L — ABNORMAL LOW (ref 1.15–1.40)
Chloride: 102 mmol/L (ref 98–111)
Chloride: 105 mmol/L (ref 98–111)
Chloride: 104 mmol/L (ref 98–111)
Chloride: 105 mmol/L (ref 98–111)
Creatinine, Ser: 0.7 mg/dL (ref 0.61–1.24)
Creatinine, Ser: 0.8 mg/dL (ref 0.61–1.24)
Creatinine, Ser: 0.7 mg/dL (ref 0.61–1.24)
GLUCOSE: 113 mg/dL — AB (ref 70–99)
GLUCOSE: 114 mg/dL — AB (ref 70–99)
GLUCOSE: 123 mg/dL — AB (ref 70–99)
GLUCOSE: 143 mg/dL — AB (ref 70–99)
GLUCOSE: 134 mg/dL — AB (ref 70–99)
Glucose, Bld: 130 mg/dL — ABNORMAL HIGH (ref 70–99)
HCT: 40 % (ref 39.0–52.0)
HCT: 30 % — ABNORMAL LOW (ref 39.0–52.0)
HCT: 36 % — ABNORMAL LOW (ref 39.0–52.0)
HCT: 31 % — ABNORMAL LOW (ref 39.0–52.0)
HCT: 35 % — ABNORMAL LOW (ref 39.0–52.0)
HEMATOCRIT: 31 % — AB (ref 39.0–52.0)
HEMOGLOBIN: 10.2 g/dL — AB (ref 13.0–17.0)
HEMOGLOBIN: 10.5 g/dL — AB (ref 13.0–17.0)
HEMOGLOBIN: 11.9 g/dL — AB (ref 13.0–17.0)
Hemoglobin: 13.6 g/dL (ref 13.0–17.0)
Hemoglobin: 12.2 g/dL — ABNORMAL LOW (ref 13.0–17.0)
Hemoglobin: 10.5 g/dL — ABNORMAL LOW (ref 13.0–17.0)
POTASSIUM: 4.3 mmol/L (ref 3.5–5.1)
POTASSIUM: 4.2 mmol/L (ref 3.5–5.1)
Potassium: 4.4 mmol/L (ref 3.5–5.1)
Potassium: 4.5 mmol/L (ref 3.5–5.1)
Potassium: 5.1 mmol/L (ref 3.5–5.1)
Potassium: 4.7 mmol/L (ref 3.5–5.1)
SODIUM: 137 mmol/L (ref 135–145)
Sodium: 137 mmol/L (ref 135–145)
Sodium: 136 mmol/L (ref 135–145)
Sodium: 137 mmol/L (ref 135–145)
Sodium: 138 mmol/L (ref 135–145)
Sodium: 140 mmol/L (ref 135–145)
TCO2: 24 mmol/L (ref 22–32)
TCO2: 24 mmol/L (ref 22–32)
TCO2: 25 mmol/L (ref 22–32)
TCO2: 16 mmol/L — AB (ref 22–32)
TCO2: 22 mmol/L (ref 22–32)
TCO2: 23 mmol/L (ref 22–32)

## 2017-11-16 LAB — CBC
HCT: 43.5 % (ref 39.0–52.0)
HCT: 39 % (ref 39.0–52.0)
HCT: 36.6 % — ABNORMAL LOW (ref 39.0–52.0)
HEMOGLOBIN: 13.2 g/dL (ref 13.0–17.0)
Hemoglobin: 14.2 g/dL (ref 13.0–17.0)
Hemoglobin: 12.2 g/dL — ABNORMAL LOW (ref 13.0–17.0)
MCH: 31.6 pg (ref 26.0–34.0)
MCH: 32.7 pg (ref 26.0–34.0)
MCH: 32.2 pg (ref 26.0–34.0)
MCHC: 32.6 g/dL (ref 30.0–36.0)
MCHC: 33.8 g/dL (ref 30.0–36.0)
MCHC: 33.3 g/dL (ref 30.0–36.0)
MCV: 96.9 fL (ref 80.0–100.0)
MCV: 96.5 fL (ref 80.0–100.0)
MCV: 96.6 fL (ref 80.0–100.0)
Platelets: 267 10*3/uL (ref 150–400)
Platelets: 189 10*3/uL (ref 150–400)
Platelets: 210 10*3/uL (ref 150–400)
RBC: 4.49 MIL/uL (ref 4.22–5.81)
RBC: 4.04 MIL/uL — AB (ref 4.22–5.81)
RBC: 3.79 MIL/uL — ABNORMAL LOW (ref 4.22–5.81)
RDW: 12.2 % (ref 11.5–15.5)
RDW: 12.2 % (ref 11.5–15.5)
RDW: 12.2 % (ref 11.5–15.5)
WBC: 7.3 10*3/uL (ref 4.0–10.5)
WBC: 11.6 10*3/uL — ABNORMAL HIGH (ref 4.0–10.5)
WBC: 15.9 10*3/uL — ABNORMAL HIGH (ref 4.0–10.5)
nRBC: 0 % (ref 0.0–0.2)
nRBC: 0 % (ref 0.0–0.2)
nRBC: 0 % (ref 0.0–0.2)

## 2017-11-16 LAB — POCT I-STAT 3, ART BLOOD GAS (G3+)
ACID-BASE DEFICIT: 5 mmol/L — AB (ref 0.0–2.0)
Acid-base deficit: 4 mmol/L — ABNORMAL HIGH (ref 0.0–2.0)
BICARBONATE: 20.8 mmol/L (ref 20.0–28.0)
Bicarbonate: 25.4 mmol/L (ref 20.0–28.0)
Bicarbonate: 25.6 mmol/L (ref 20.0–28.0)
Bicarbonate: 21.8 mmol/L (ref 20.0–28.0)
O2 SAT: 100 %
O2 SAT: 96 %
O2 SAT: 97 %
O2 Saturation: 98 %
PCO2 ART: 44.6 mmHg (ref 32.0–48.0)
PCO2 ART: 40.9 mmHg (ref 32.0–48.0)
PH ART: 7.334 — AB (ref 7.350–7.450)
PO2 ART: 323 mmHg — AB (ref 83.0–108.0)
PO2 ART: 79 mmHg — AB (ref 83.0–108.0)
TCO2: 27 mmol/L (ref 22–32)
TCO2: 27 mmol/L (ref 22–32)
TCO2: 23 mmol/L (ref 22–32)
TCO2: 22 mmol/L (ref 22–32)
pCO2 arterial: 43.8 mmHg (ref 32.0–48.0)
pCO2 arterial: 38.7 mmHg (ref 32.0–48.0)
pH, Arterial: 7.371 (ref 7.350–7.450)
pH, Arterial: 7.361 (ref 7.350–7.450)
pH, Arterial: 7.331 — ABNORMAL LOW (ref 7.350–7.450)
pO2, Arterial: 90 mmHg (ref 83.0–108.0)
pO2, Arterial: 104 mmHg (ref 83.0–108.0)

## 2017-11-16 LAB — GLUCOSE, CAPILLARY
GLUCOSE-CAPILLARY: 113 mg/dL — AB (ref 70–99)
GLUCOSE-CAPILLARY: 123 mg/dL — AB (ref 70–99)
GLUCOSE-CAPILLARY: 144 mg/dL — AB (ref 70–99)
GLUCOSE-CAPILLARY: 97 mg/dL (ref 70–99)
Glucose-Capillary: 110 mg/dL — ABNORMAL HIGH (ref 70–99)
Glucose-Capillary: 71 mg/dL (ref 70–99)
Glucose-Capillary: 142 mg/dL — ABNORMAL HIGH (ref 70–99)

## 2017-11-16 LAB — BLOOD GAS, ARTERIAL
Acid-Base Excess: 0.2 mmol/L (ref 0.0–2.0)
Bicarbonate: 24.3 mmol/L (ref 20.0–28.0)
DRAWN BY: 249101
FIO2: 21
O2 SAT: 95.5 %
PCO2 ART: 39.4 mmHg (ref 32.0–48.0)
PH ART: 7.408 (ref 7.350–7.450)
Patient temperature: 98.6
pO2, Arterial: 76.5 mmHg — ABNORMAL LOW (ref 83.0–108.0)

## 2017-11-16 LAB — BASIC METABOLIC PANEL WITH GFR
Anion gap: 9 (ref 5–15)
BUN: 11 mg/dL (ref 8–23)
CO2: 23 mmol/L (ref 22–32)
Calcium: 8.7 mg/dL — ABNORMAL LOW (ref 8.9–10.3)
Chloride: 105 mmol/L (ref 98–111)
Creatinine, Ser: 0.86 mg/dL (ref 0.61–1.24)
GFR calc Af Amer: >60 mL/min
GFR calc non Af Amer: >60 mL/min
Glucose, Bld: 112 mg/dL — ABNORMAL HIGH (ref 70–99)
Potassium: 3.9 mmol/L (ref 3.5–5.1)
Sodium: 137 mmol/L (ref 135–145)

## 2017-11-16 LAB — CREATININE, SERUM
Creatinine, Ser: 0.75 mg/dL (ref 0.61–1.24)
GFR calc Af Amer: >60 mL/min (ref >60)
GFR calc non Af Amer: >60 mL/min (ref >60)

## 2017-11-16 LAB — PLATELET COUNT: Platelets: 236 10*3/uL (ref 150–400)

## 2017-11-16 LAB — SURGICAL PCR SCREEN
MRSA, PCR: NEGATIVE
Staphylococcus aureus: NEGATIVE

## 2017-11-16 LAB — HEMOGLOBIN AND HEMATOCRIT, BLOOD
HCT: 31.4 % — ABNORMAL LOW (ref 39.0–52.0)
Hemoglobin: 10.8 g/dL — ABNORMAL LOW (ref 13.0–17.0)

## 2017-11-16 LAB — PROTIME-INR
INR: 1.35
Prothrombin Time: 16.6 seconds — ABNORMAL HIGH (ref 11.4–15.2)

## 2017-11-16 LAB — MAGNESIUM: Magnesium: 2.9 mg/dL — ABNORMAL HIGH (ref 1.7–2.4)

## 2017-11-16 LAB — APTT: aPTT: 32 seconds (ref 24–36)

## 2017-11-16 SURGERY — CORONARY ARTERY BYPASS GRAFTING (CABG)
Anesthesia: General | Site: Chest

## 2017-11-16 MED ORDER — LACTATED RINGERS IV SOLN
INTRAVENOUS | Status: DC | PRN
  Administered 2017-11-16 (×2): via INTRAVENOUS

## 2017-11-16 MED ORDER — SODIUM CHLORIDE 0.9 % IV SOLN
INTRAVENOUS | Status: DC
  Administered 2017-11-16: 14:00:00 via INTRAVENOUS

## 2017-11-16 MED ORDER — PROTAMINE SULFATE 10 MG/ML IV SOLN
INTRAVENOUS | Status: AC
  Filled 2017-11-16: qty 5

## 2017-11-16 MED ORDER — TRAMADOL HCL 50 MG PO TABS
50.0000 mg | ORAL_TABLET | ORAL | Status: DC | PRN
  Administered 2017-11-17: 50 mg via ORAL
  Filled 2017-11-16: qty 1

## 2017-11-16 MED ORDER — SODIUM CHLORIDE 0.9 % IV SOLN: 250.0000 mL | INTRAVENOUS | Status: DC

## 2017-11-16 MED ORDER — OXYCODONE HCL 5 MG PO TABS
5.0000 mg | ORAL_TABLET | ORAL | Status: DC | PRN
  Administered 2017-11-16 – 2017-11-17 (×2): 5 mg via ORAL
  Administered 2017-11-17: 10 mg via ORAL
  Administered 2017-11-17 (×2): 5 mg via ORAL
  Administered 2017-11-17: 10 mg via ORAL
  Administered 2017-11-18: 5 mg via ORAL
  Administered 2017-11-18: 10 mg via ORAL
  Administered 2017-11-18: 5 mg via ORAL
  Filled 2017-11-16 (×2): qty 2
  Filled 2017-11-16 (×5): qty 1
  Filled 2017-11-16: qty 2

## 2017-11-16 MED ORDER — PROTAMINE SULFATE 10 MG/ML IV SOLN
INTRAVENOUS | Status: AC
  Filled 2017-11-16: qty 25

## 2017-11-16 MED ORDER — SODIUM CHLORIDE 0.9% FLUSH: 3.0000 mL | INTRAVENOUS | Status: DC | PRN

## 2017-11-16 MED ORDER — MORPHINE SULFATE (PF) 2 MG/ML IV SOLN: 1.0000 mg | INTRAVENOUS | Status: AC | PRN

## 2017-11-16 MED ORDER — SIMVASTATIN 40 MG PO TABS
40.0000 mg | ORAL_TABLET | Freq: Every evening | ORAL | Status: DC
  Administered 2017-11-17: 40 mg via ORAL
  Filled 2017-11-16: qty 1

## 2017-11-16 MED ORDER — ROCURONIUM BROMIDE 10 MG/ML (PF) SYRINGE
PREFILLED_SYRINGE | INTRAVENOUS | Status: DC | PRN
  Administered 2017-11-16: 20 mg via INTRAVENOUS
  Administered 2017-11-16: 80 mg via INTRAVENOUS

## 2017-11-16 MED ORDER — LACTATED RINGERS IV SOLN
INTRAVENOUS | Status: DC | PRN
  Administered 2017-11-16 (×2): via INTRAVENOUS

## 2017-11-16 MED ORDER — GLYCOPYRROLATE PF 0.2 MG/ML IJ SOSY
PREFILLED_SYRINGE | INTRAMUSCULAR | Status: AC
  Filled 2017-11-16: qty 1

## 2017-11-16 MED ORDER — MIDAZOLAM HCL 5 MG/5ML IJ SOLN
INTRAMUSCULAR | Status: DC | PRN
  Administered 2017-11-16: 2 mg via INTRAVENOUS
  Administered 2017-11-16: 5 mg via INTRAVENOUS
  Administered 2017-11-16: 1 mg via INTRAVENOUS
  Administered 2017-11-16: 3 mg via INTRAVENOUS
  Administered 2017-11-16: 1 mg via INTRAVENOUS
  Administered 2017-11-16: 2 mg via INTRAVENOUS

## 2017-11-16 MED ORDER — CHLORHEXIDINE GLUCONATE 0.12% ORAL RINSE (MEDLINE KIT): 15.0000 mL | Freq: Two times a day (BID) | OROMUCOSAL | Status: DC

## 2017-11-16 MED ORDER — ONDANSETRON HCL 4 MG/2ML IJ SOLN
INTRAMUSCULAR | Status: AC
  Filled 2017-11-16: qty 2

## 2017-11-16 MED ORDER — VECURONIUM BROMIDE 10 MG IV SOLR
INTRAVENOUS | Status: DC | PRN
  Administered 2017-11-16 (×2): 5 mg via INTRAVENOUS

## 2017-11-16 MED ORDER — ASPIRIN EC 325 MG PO TBEC
325.0000 mg | DELAYED_RELEASE_TABLET | Freq: Every day | ORAL | Status: DC
  Administered 2017-11-17 – 2017-11-19 (×3): 325 mg via ORAL
  Filled 2017-11-16 (×3): qty 1

## 2017-11-16 MED ORDER — MIDAZOLAM HCL 2 MG/2ML IJ SOLN: 2.0000 mg | INTRAMUSCULAR | Status: DC | PRN

## 2017-11-16 MED ORDER — MAGNESIUM SULFATE 4 GM/100ML IV SOLN
4.0000 g | Freq: Once | INTRAVENOUS | Status: AC
  Administered 2017-11-16: 4 g via INTRAVENOUS
  Filled 2017-11-16: qty 100

## 2017-11-16 MED ORDER — CITALOPRAM HYDROBROMIDE 20 MG PO TABS
20.0000 mg | ORAL_TABLET | Freq: Every day | ORAL | Status: DC
  Administered 2017-11-17 – 2017-11-22 (×6): 20 mg via ORAL
  Filled 2017-11-16 (×6): qty 1

## 2017-11-16 MED ORDER — MIDAZOLAM HCL 10 MG/2ML IJ SOLN
INTRAMUSCULAR | Status: AC
  Filled 2017-11-16: qty 2

## 2017-11-16 MED ORDER — EPHEDRINE 5 MG/ML INJ
INTRAVENOUS | Status: AC
  Filled 2017-11-16: qty 10

## 2017-11-16 MED ORDER — ROCURONIUM BROMIDE 50 MG/5ML IV SOSY
PREFILLED_SYRINGE | INTRAVENOUS | Status: AC
  Filled 2017-11-16: qty 5

## 2017-11-16 MED ORDER — CHLORHEXIDINE GLUCONATE 0.12 % MT SOLN
15.0000 mL | OROMUCOSAL | Status: AC
  Administered 2017-11-16: 15 mL via OROMUCOSAL

## 2017-11-16 MED ORDER — 0.9 % SODIUM CHLORIDE (POUR BTL) OPTIME
TOPICAL | Status: DC | PRN
  Administered 2017-11-16: 4000 mL

## 2017-11-16 MED ORDER — ORAL CARE MOUTH RINSE
15.0000 mL | OROMUCOSAL | Status: DC
  Administered 2017-11-16 (×2): 15 mL via OROMUCOSAL

## 2017-11-16 MED ORDER — PROTAMINE SULFATE 10 MG/ML IV SOLN
INTRAVENOUS | Status: DC | PRN
  Administered 2017-11-16: 25 mg via INTRAVENOUS
  Administered 2017-11-16 (×5): 50 mg via INTRAVENOUS
  Administered 2017-11-16: 20 mg via INTRAVENOUS

## 2017-11-16 MED ORDER — METOPROLOL TARTRATE 5 MG/5ML IV SOLN
2.5000 mg | INTRAVENOUS | Status: DC | PRN
  Administered 2017-11-18: 5 mg via INTRAVENOUS

## 2017-11-16 MED ORDER — FENTANYL CITRATE (PF) 250 MCG/5ML IJ SOLN
INTRAMUSCULAR | Status: DC | PRN
  Administered 2017-11-16: 150 ug via INTRAVENOUS
  Administered 2017-11-16: 250 ug via INTRAVENOUS
  Administered 2017-11-16: 150 ug via INTRAVENOUS
  Administered 2017-11-16: 100 ug via INTRAVENOUS
  Administered 2017-11-16: 50 ug via INTRAVENOUS
  Administered 2017-11-16: 100 ug via INTRAVENOUS
  Administered 2017-11-16: 150 ug via INTRAVENOUS
  Administered 2017-11-16 (×2): 250 ug via INTRAVENOUS
  Administered 2017-11-16: 50 ug via INTRAVENOUS

## 2017-11-16 MED ORDER — DEXMEDETOMIDINE HCL IN NACL 200 MCG/50ML IV SOLN
0.0000 ug/kg/h | INTRAVENOUS | Status: DC
  Filled 2017-11-16: qty 50

## 2017-11-16 MED ORDER — PHENYLEPHRINE 40 MCG/ML (10ML) SYRINGE FOR IV PUSH (FOR BLOOD PRESSURE SUPPORT)
PREFILLED_SYRINGE | INTRAVENOUS | Status: AC
  Filled 2017-11-16: qty 10

## 2017-11-16 MED ORDER — SODIUM CHLORIDE 0.9% FLUSH: 10.0000 mL | INTRAVENOUS | Status: DC | PRN

## 2017-11-16 MED ORDER — INSULIN REGULAR(HUMAN) IN NACL 100-0.9 UT/100ML-% IV SOLN: INTRAVENOUS | Status: DC

## 2017-11-16 MED ORDER — ACETAMINOPHEN 650 MG RE SUPP
650.0000 mg | Freq: Once | RECTAL | Status: AC
  Administered 2017-11-16: 650 mg via RECTAL

## 2017-11-16 MED ORDER — ASPIRIN 81 MG PO CHEW
324.0000 mg | CHEWABLE_TABLET | Freq: Every day | ORAL | Status: DC
  Filled 2017-11-16: qty 4

## 2017-11-16 MED ORDER — NITROGLYCERIN IN D5W 200-5 MCG/ML-% IV SOLN: 0.0000 ug/min | INTRAVENOUS | Status: DC

## 2017-11-16 MED ORDER — FAMOTIDINE IN NACL 20-0.9 MG/50ML-% IV SOLN
20.0000 mg | Freq: Two times a day (BID) | INTRAVENOUS | Status: AC
  Administered 2017-11-16 (×2): 20 mg via INTRAVENOUS
  Filled 2017-11-16: qty 50

## 2017-11-16 MED ORDER — LIDOCAINE HCL (PF) 1 % IJ SOLN
INTRAMUSCULAR | Status: AC
  Filled 2017-11-16: qty 30

## 2017-11-16 MED ORDER — HEPARIN SODIUM (PORCINE) 1000 UNIT/ML IJ SOLN
INTRAMUSCULAR | Status: DC | PRN
  Administered 2017-11-16: 27000 [IU] via INTRAVENOUS

## 2017-11-16 MED ORDER — ACETAMINOPHEN 500 MG PO TABS
1000.0000 mg | ORAL_TABLET | Freq: Four times a day (QID) | ORAL | Status: DC
  Administered 2017-11-16 – 2017-11-19 (×10): 1000 mg via ORAL
  Filled 2017-11-16 (×10): qty 2

## 2017-11-16 MED ORDER — PROPOFOL 10 MG/ML IV BOLUS
INTRAVENOUS | Status: AC
  Filled 2017-11-16: qty 40

## 2017-11-16 MED ORDER — MIDAZOLAM HCL 2 MG/2ML IJ SOLN
INTRAMUSCULAR | Status: AC
  Filled 2017-11-16: qty 2

## 2017-11-16 MED ORDER — SODIUM CHLORIDE 0.45 % IV SOLN
INTRAVENOUS | Status: DC | PRN
  Administered 2017-11-16: 14:00:00 via INTRAVENOUS

## 2017-11-16 MED ORDER — VECURONIUM BROMIDE 10 MG IV SOLR
INTRAVENOUS | Status: AC
  Filled 2017-11-16: qty 10

## 2017-11-16 MED ORDER — FENTANYL CITRATE (PF) 250 MCG/5ML IJ SOLN
INTRAMUSCULAR | Status: AC
  Filled 2017-11-16: qty 25

## 2017-11-16 MED ORDER — POTASSIUM CHLORIDE 10 MEQ/50ML IV SOLN: 10.0000 meq | INTRAVENOUS | Status: AC

## 2017-11-16 MED ORDER — DOCUSATE SODIUM 100 MG PO CAPS
200.0000 mg | ORAL_CAPSULE | Freq: Every day | ORAL | Status: DC
  Administered 2017-11-17 – 2017-11-19 (×3): 200 mg via ORAL
  Filled 2017-11-16 (×3): qty 2

## 2017-11-16 MED ORDER — GLYCOPYRROLATE PF 0.2 MG/ML IJ SOSY
PREFILLED_SYRINGE | INTRAMUSCULAR | Status: DC | PRN
  Administered 2017-11-16: .2 mg via INTRAVENOUS

## 2017-11-16 MED ORDER — HEPARIN SODIUM (PORCINE) 1000 UNIT/ML IJ SOLN
INTRAMUSCULAR | Status: AC
  Filled 2017-11-16: qty 1

## 2017-11-16 MED ORDER — BISACODYL 10 MG RE SUPP: 10.0000 mg | Freq: Every day | RECTAL | Status: DC

## 2017-11-16 MED ORDER — SODIUM CHLORIDE 0.9% FLUSH
3.0000 mL | Freq: Two times a day (BID) | INTRAVENOUS | Status: DC
  Administered 2017-11-17 – 2017-11-18 (×2): 3 mL via INTRAVENOUS

## 2017-11-16 MED ORDER — HEMOSTATIC AGENTS (NO CHARGE) OPTIME
TOPICAL | Status: DC | PRN
  Administered 2017-11-16 (×2): 1 via TOPICAL

## 2017-11-16 MED ORDER — LIDOCAINE 2% (20 MG/ML) 5 ML SYRINGE
INTRAMUSCULAR | Status: AC
  Filled 2017-11-16: qty 5

## 2017-11-16 MED ORDER — METOPROLOL TARTRATE 25 MG/10 ML ORAL SUSPENSION: 12.5000 mg | Freq: Two times a day (BID) | ORAL | Status: DC

## 2017-11-16 MED ORDER — SODIUM CHLORIDE 0.9 % IV SOLN
INTRAVENOUS | Status: DC | PRN
  Administered 2017-11-16: 25 ug/min via INTRAVENOUS

## 2017-11-16 MED ORDER — PHENYLEPHRINE 40 MCG/ML (10ML) SYRINGE FOR IV PUSH (FOR BLOOD PRESSURE SUPPORT)
PREFILLED_SYRINGE | INTRAVENOUS | Status: DC | PRN
  Administered 2017-11-16: 40 ug via INTRAVENOUS
  Administered 2017-11-16: 80 ug via INTRAVENOUS
  Administered 2017-11-16 (×3): 40 ug via INTRAVENOUS
  Administered 2017-11-16 (×3): 80 ug via INTRAVENOUS

## 2017-11-16 MED ORDER — ACETAMINOPHEN 160 MG/5ML PO SOLN: 650.0000 mg | Freq: Once | ORAL | Status: AC

## 2017-11-16 MED ORDER — FOLIC ACID 1 MG PO TABS
1.0000 mg | ORAL_TABLET | Freq: Every day | ORAL | Status: DC
  Administered 2017-11-17 – 2017-11-22 (×6): 1 mg via ORAL
  Filled 2017-11-16 (×6): qty 1

## 2017-11-16 MED ORDER — ORAL CARE MOUTH RINSE
15.0000 mL | Freq: Two times a day (BID) | OROMUCOSAL | Status: DC
  Administered 2017-11-16 – 2017-11-22 (×8): 15 mL via OROMUCOSAL

## 2017-11-16 MED ORDER — SODIUM CHLORIDE 0.9% FLUSH
10.0000 mL | Freq: Two times a day (BID) | INTRAVENOUS | Status: DC
  Administered 2017-11-16 – 2017-11-19 (×4): 10 mL

## 2017-11-16 MED ORDER — LACTATED RINGERS IV SOLN
INTRAVENOUS | Status: DC
  Administered 2017-11-18: 07:00:00 via INTRAVENOUS

## 2017-11-16 MED ORDER — PANTOPRAZOLE SODIUM 40 MG PO TBEC
40.0000 mg | DELAYED_RELEASE_TABLET | Freq: Every day | ORAL | Status: DC
  Administered 2017-11-18 – 2017-11-19 (×2): 40 mg via ORAL
  Filled 2017-11-16 (×2): qty 1

## 2017-11-16 MED ORDER — CALCIUM CHLORIDE 10 % IV SOLN
INTRAVENOUS | Status: AC
  Filled 2017-11-16: qty 10

## 2017-11-16 MED ORDER — LACTATED RINGERS IV SOLN
INTRAVENOUS | Status: DC | PRN
  Administered 2017-11-16 (×2): via INTRAVENOUS

## 2017-11-16 MED ORDER — LACTATED RINGERS IV SOLN: 500.0000 mL | Freq: Once | INTRAVENOUS | Status: DC | PRN

## 2017-11-16 MED ORDER — FENTANYL CITRATE (PF) 250 MCG/5ML IJ SOLN
INTRAMUSCULAR | Status: AC
  Filled 2017-11-16: qty 5

## 2017-11-16 MED ORDER — CALCIUM CHLORIDE 10 % IV SOLN
INTRAVENOUS | Status: DC | PRN
  Administered 2017-11-16: 200 mg via INTRAVENOUS

## 2017-11-16 MED ORDER — MORPHINE SULFATE (PF) 2 MG/ML IV SOLN
2.0000 mg | INTRAVENOUS | Status: DC | PRN
  Administered 2017-11-17: 2 mg via INTRAVENOUS
  Filled 2017-11-16: qty 1

## 2017-11-16 MED ORDER — VANCOMYCIN HCL IN DEXTROSE 1-5 GM/200ML-% IV SOLN
1000.0000 mg | Freq: Once | INTRAVENOUS | Status: AC
  Administered 2017-11-16: 1000 mg via INTRAVENOUS

## 2017-11-16 MED ORDER — ACETAMINOPHEN 160 MG/5ML PO SOLN: 1000.0000 mg | Freq: Four times a day (QID) | ORAL | Status: DC

## 2017-11-16 MED ORDER — PROPOFOL 10 MG/ML IV BOLUS
INTRAVENOUS | Status: DC | PRN
  Administered 2017-11-16: 10 mg via INTRAVENOUS
  Administered 2017-11-16 (×2): 50 mg via INTRAVENOUS
  Administered 2017-11-16 (×2): 20 mg via INTRAVENOUS
  Administered 2017-11-16: 50 mg via INTRAVENOUS

## 2017-11-16 MED ORDER — ALBUMIN HUMAN 5 % IV SOLN
250.0000 mL | INTRAVENOUS | Status: AC | PRN
  Administered 2017-11-16 – 2017-11-17 (×4): 12.5 g via INTRAVENOUS
  Filled 2017-11-16 (×2): qty 250

## 2017-11-16 MED ORDER — PHENYLEPHRINE HCL-NACL 20-0.9 MG/250ML-% IV SOLN
0.0000 ug/min | INTRAVENOUS | Status: DC
  Filled 2017-11-16: qty 250

## 2017-11-16 MED ORDER — INSULIN REGULAR BOLUS VIA INFUSION
0.0000 [IU] | Freq: Three times a day (TID) | INTRAVENOUS | Status: DC
  Filled 2017-11-16: qty 10

## 2017-11-16 MED ORDER — PLASMA-LYTE 148 IV SOLN
INTRAVENOUS | Status: DC | PRN
  Administered 2017-11-16: 500 mL via INTRAVASCULAR

## 2017-11-16 MED ORDER — VITAMIN B-1 100 MG PO TABS
50.0000 mg | ORAL_TABLET | Freq: Every day | ORAL | Status: DC
  Administered 2017-11-17 – 2017-11-22 (×6): 50 mg via ORAL
  Filled 2017-11-16 (×7): qty 1

## 2017-11-16 MED ORDER — SODIUM CHLORIDE 0.9 % IV SOLN
1.5000 g | Freq: Two times a day (BID) | INTRAVENOUS | Status: AC
  Administered 2017-11-16 – 2017-11-18 (×4): 1.5 g via INTRAVENOUS
  Filled 2017-11-16 (×4): qty 1.5

## 2017-11-16 MED ORDER — CHLORHEXIDINE GLUCONATE CLOTH 2 % EX PADS
6.0000 | MEDICATED_PAD | Freq: Every day | CUTANEOUS | Status: DC
  Administered 2017-11-16 – 2017-11-19 (×4): 6 via TOPICAL

## 2017-11-16 MED ORDER — LACTATED RINGERS IV SOLN: INTRAVENOUS | Status: DC

## 2017-11-16 MED ORDER — DEXAMETHASONE SODIUM PHOSPHATE 10 MG/ML IJ SOLN
INTRAMUSCULAR | Status: DC | PRN
  Administered 2017-11-16: 10 mg via INTRAVENOUS

## 2017-11-16 MED ORDER — ONDANSETRON HCL 4 MG/2ML IJ SOLN
4.0000 mg | Freq: Four times a day (QID) | INTRAMUSCULAR | Status: DC | PRN
  Administered 2017-11-18: 4 mg via INTRAVENOUS
  Filled 2017-11-16 (×2): qty 2

## 2017-11-16 MED ORDER — DEXAMETHASONE SODIUM PHOSPHATE 10 MG/ML IJ SOLN
INTRAMUSCULAR | Status: AC
  Filled 2017-11-16: qty 1

## 2017-11-16 MED ORDER — LIDOCAINE 2% (20 MG/ML) 5 ML SYRINGE: INTRAMUSCULAR | Status: DC | PRN

## 2017-11-16 MED ORDER — ALPRAZOLAM 0.25 MG PO TABS: 0.2500 mg | ORAL_TABLET | Freq: Every day | ORAL | Status: DC | PRN

## 2017-11-16 MED ORDER — BISACODYL 5 MG PO TBEC
10.0000 mg | DELAYED_RELEASE_TABLET | Freq: Every day | ORAL | Status: DC
  Administered 2017-11-17 – 2017-11-19 (×3): 10 mg via ORAL
  Filled 2017-11-16 (×3): qty 2

## 2017-11-16 MED ORDER — METOPROLOL TARTRATE 12.5 MG HALF TABLET
12.5000 mg | ORAL_TABLET | Freq: Two times a day (BID) | ORAL | Status: DC
  Administered 2017-11-17 – 2017-11-19 (×5): 12.5 mg via ORAL
  Filled 2017-11-16 (×5): qty 1

## 2017-11-16 MED ORDER — EPHEDRINE SULFATE-NACL 50-0.9 MG/10ML-% IV SOSY
PREFILLED_SYRINGE | INTRAVENOUS | Status: DC | PRN
  Administered 2017-11-16 (×2): 10 mg via INTRAVENOUS

## 2017-11-16 MED ORDER — ALBUMIN HUMAN 5 % IV SOLN
INTRAVENOUS | Status: DC | PRN
  Administered 2017-11-16: 12:00:00 via INTRAVENOUS

## 2017-11-16 SURGICAL SUPPLY — 63 items
BAG DECANTER FOR FLEXI CONT (MISCELLANEOUS) ×3 IMPLANT
BANDAGE ACE 4X5 VEL STRL LF (GAUZE/BANDAGES/DRESSINGS) ×3 IMPLANT
BANDAGE ACE 6X5 VEL STRL LF (GAUZE/BANDAGES/DRESSINGS) ×3 IMPLANT
BLADE STERNUM SYSTEM 6 (BLADE) ×3 IMPLANT
BLADE SURG 11 STRL SS (BLADE) ×1 IMPLANT
BNDG GAUZE ELAST 4 BULKY (GAUZE/BANDAGES/DRESSINGS) ×3 IMPLANT
CANISTER SUCT 3000ML PPV (MISCELLANEOUS) ×3 IMPLANT
CATH CPB KIT GERHARDT (MISCELLANEOUS) ×3 IMPLANT
CATH THORACIC 28FR (CATHETERS) ×3 IMPLANT
COVER WAND RF STERILE (DRAPES) ×3 IMPLANT
CRADLE DONUT ADULT HEAD (MISCELLANEOUS) ×3 IMPLANT
DRAIN CHANNEL 28F RND 3/8 FF (WOUND CARE) ×3 IMPLANT
DRAPE CARDIOVASCULAR INCISE (DRAPES) ×3
DRAPE SLUSH/WARMER DISC (DRAPES) ×3 IMPLANT
DRAPE SRG 135X102X78XABS (DRAPES) ×2 IMPLANT
DRSG AQUACEL AG ADV 3.5X14 (GAUZE/BANDAGES/DRESSINGS) ×3 IMPLANT
ELECT BLADE 4.0 EZ CLEAN MEGAD (MISCELLANEOUS) ×3
ELECT REM PT RETURN 9FT ADLT (ELECTROSURGICAL) ×6
ELECTRODE BLDE 4.0 EZ CLN MEGD (MISCELLANEOUS) ×2 IMPLANT
ELECTRODE REM PT RTRN 9FT ADLT (ELECTROSURGICAL) ×4 IMPLANT
FELT TEFLON 1X6 (MISCELLANEOUS) ×6 IMPLANT
GAUZE SPONGE 4X4 12PLY STRL (GAUZE/BANDAGES/DRESSINGS) ×6 IMPLANT
GLOVE BIO SURGEON STRL SZ 6.5 (GLOVE) ×9 IMPLANT
GOWN STRL REUS W/ TWL LRG LVL3 (GOWN DISPOSABLE) ×8 IMPLANT
GOWN STRL REUS W/TWL LRG LVL3 (GOWN DISPOSABLE) ×12
HEMOSTAT POWDER SURGIFOAM 1G (HEMOSTASIS) ×9 IMPLANT
HEMOSTAT SURGICEL 2X14 (HEMOSTASIS) ×3 IMPLANT
KIT BASIN OR (CUSTOM PROCEDURE TRAY) ×3 IMPLANT
KIT CATH SUCT 8FR (CATHETERS) ×3 IMPLANT
KIT SUCTION CATH 14FR (SUCTIONS) ×6 IMPLANT
KIT TURNOVER KIT B (KITS) ×3 IMPLANT
KIT VASOVIEW HEMOPRO VH 3000 (KITS) ×3 IMPLANT
LEAD PACING MYOCARDI (MISCELLANEOUS) ×3 IMPLANT
MARKER GRAFT CORONARY BYPASS (MISCELLANEOUS) ×9 IMPLANT
NS IRRIG 1000ML POUR BTL (IV SOLUTION) ×15 IMPLANT
PACK E OPEN HEART (SUTURE) ×3 IMPLANT
PACK OPEN HEART (CUSTOM PROCEDURE TRAY) ×3 IMPLANT
PAD ARMBOARD 7.5X6 YLW CONV (MISCELLANEOUS) ×6 IMPLANT
PAD ELECT DEFIB RADIOL ZOLL (MISCELLANEOUS) ×3 IMPLANT
PENCIL BUTTON HOLSTER BLD 10FT (ELECTRODE) ×3 IMPLANT
PUNCH AORTIC ROTATE  4.5MM 8IN (MISCELLANEOUS) ×1 IMPLANT
SET CARDIOPLEGIA MPS 5001102 (MISCELLANEOUS) ×1 IMPLANT
SOLUTION ANTI FOG 6CC (MISCELLANEOUS) ×1 IMPLANT
SPONGE LAP 18X18 RF (DISPOSABLE) ×4 IMPLANT
SPONGE LAP 4X18 RFD (DISPOSABLE) ×1 IMPLANT
SUT BONE WAX W31G (SUTURE) ×3 IMPLANT
SUT MNCRL AB 4-0 PS2 18 (SUTURE) ×2 IMPLANT
SUT PROLENE 3 0 SH1 36 (SUTURE) ×3 IMPLANT
SUT PROLENE 4 0 TF (SUTURE) ×6 IMPLANT
SUT PROLENE 6 0 CC (SUTURE) ×6 IMPLANT
SUT PROLENE 7 0 BV1 MDA (SUTURE) ×3 IMPLANT
SUT STEEL 6MS V (SUTURE) ×3 IMPLANT
SUT STEEL SZ 6 DBL 3X14 BALL (SUTURE) ×3 IMPLANT
SUT VIC AB 1 CTX 18 (SUTURE) ×6 IMPLANT
SUT VIC AB 2-0 CT1 27 (SUTURE) ×6
SUT VIC AB 2-0 CT1 TAPERPNT 27 (SUTURE) IMPLANT
SYSTEM SAHARA CHEST DRAIN ATS (WOUND CARE) ×3 IMPLANT
TOWEL GREEN STERILE (TOWEL DISPOSABLE) ×3 IMPLANT
TOWEL GREEN STERILE FF (TOWEL DISPOSABLE) ×3 IMPLANT
TRAY FOLEY SLVR 16FR TEMP STAT (SET/KITS/TRAYS/PACK) ×3 IMPLANT
TUBING INSUFFLATION (TUBING) ×3 IMPLANT
UNDERPAD 30X30 (UNDERPADS AND DIAPERS) ×3 IMPLANT
WATER STERILE IRR 1000ML POUR (IV SOLUTION) ×6 IMPLANT

## 2017-11-16 NOTE — Anesthesia Procedure Notes (Signed)
Central Venous Catheter Insertion Performed by: Roberts Gaudy, MD, anesthesiologist Start/End10/11/2017 6:20 AM, 11/16/2017 6:30 AM Patient location: Pre-op. Preanesthetic checklist: patient identified, IV checked, site marked, risks and benefits discussed, surgical consent, monitors and equipment checked, pre-op evaluation, timeout performed and anesthesia consent Hand hygiene performed  and maximum sterile barriers used  PA cath was placed.Swan type:thermodilution Procedure performed without using ultrasound guided technique. Ultrasound Notes:anatomy identified and needle tip was noted to be adjacent to the nerve/plexus identified Attempts: 1 Following insertion, line sutured, dressing applied and Biopatch. Patient tolerated the procedure well with no immediate complications.

## 2017-11-16 NOTE — Anesthesia Procedure Notes (Signed)
Procedure Name: Intubation Date/Time: 11/16/2017 7:53 AM Performed by: Renato Shin, CRNA Pre-anesthesia Checklist: Patient identified, Emergency Drugs available, Suction available and Patient being monitored Patient Re-evaluated:Patient Re-evaluated prior to induction Oxygen Delivery Method: Circle system utilized Preoxygenation: Pre-oxygenation with 100% oxygen Induction Type: IV induction Ventilation: Oral airway inserted - appropriate to patient size and Two handed mask ventilation required Laryngoscope Size: Miller and 3 Grade View: Grade I Tube type: Oral Tube size: 8.0 mm Number of attempts: 1 Airway Equipment and Method: Stylet Placement Confirmation: ETT inserted through vocal cords under direct vision,  positive ETCO2,  CO2 detector and breath sounds checked- equal and bilateral Secured at: 21 cm Tube secured with: Tape Dental Injury: Teeth and Oropharynx as per pre-operative assessment

## 2017-11-16 NOTE — Anesthesia Preprocedure Evaluation (Addendum)
Anesthesia Evaluation  Patient identified by MRN, date of birth, ID band Patient awake    Reviewed: Allergy & Precautions, NPO status , Patient's Chart, lab work & pertinent test results, reviewed documented beta blocker date and time   History of Anesthesia Complications Negative for: history of anesthetic complications  Airway Mallampati: II  TM Distance: >3 FB Neck ROM: Full    Dental  (+) Dental Advisory Given   Pulmonary neg pulmonary ROS,    breath sounds clear to auscultation       Cardiovascular hypertension, Pt. 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  LVEDP 12 mm.  Previous echo documentation of hyperdynamic LV function with an EF of 65 to 70%.   Left ventricle: The cavity size was normal. There was mild   concentric hypertrophy. Systolic function was vigorous. The   estimated ejection fraction was in the range of 65% to 70%. Wall   motion was normal; there were no regional wall motion   abnormalities. Features are consistent with a pseudonormal left   ventricular filling pattern, with concomitant abnormal relaxation   and  increased filling pressure (grade 2 diastolic dysfunction).   Doppler parameters are consistent with elevated ventricular   end-diastolic filling pressure. - Aortic valve: There was no regurgitation. - Mitral valve: There was mild regurgitation. - Left atrium: The atrium was mildly dilated. - Right ventricle: Systolic function was normal. - Right atrium: The atrium was normal in size. - Tricuspid valve: There was mild regurgitation. - Pulmonary arteries: Systolic pressure was within the normal   range. - Inferior vena cava: The vessel was normal in size. The   respirophasic diameter changes were in the normal range (= 50%),   consistent with normal central venous pressure. - Pericardium, extracardiac: There was no pericardial effusion.  Reproductive/Obstetrics                            Anesthesia Physical Anesthesia Plan  ASA: IV  Anesthesia Plan: General   Post-op Pain Management:    Induction: Intravenous  PONV Risk Score and Plan: 2 and Treatment may vary due to age or medical condition  Airway Management Planned: Oral ETT  Additional Equipment: Arterial line, CVP, PA Cath, TEE and Ultrasound Guidance Line Placement  Intra-op Plan:   Post-operative Plan: Post-operative intubation/ventilation  Informed Consent: I have reviewed the patients History and Physical, chart, labs and discussed the procedure including the risks, benefits and alternatives for the proposed anesthesia with the patient or authorized representative who has indicated his/her understanding and acceptance.   Dental advisory given  Plan Discussed with: CRNA and Surgeon  Anesthesia Plan Comments:  Anesthesia Quick Evaluation  

## 2017-11-16 NOTE — Progress Notes (Signed)
      [address]       [place] [address]             [phone]    S/p CABG x 4   Extubated  BP 104/74   Pulse 89   Temp (!) 97.3 F (36.3 C)   Resp 13   Ht 5\' 8"  (1.727 m)   Wt 87.7 kg   SpO2 99%   BMI 29.39 kg/m  21/12, CI= 2.0   Intake/Output Summary (Last 24 hours) at 11/16/2017 1803 Last data filed at 11/16/2017 1709 Gross per 24 hour  Intake 7749.36 ml  Output 3635 ml  Net 4114.36 ml   Hct= 39  Doing well early postop  Carl Lipps C. Roxan Hockey, MD [facility] [phone]

## 2017-11-16 NOTE — Procedures (Signed)
Extubation Procedure Note  Patient Details:   Name: Carl [name] DOB: 19-Jul-1945 MRN: [id number]   Airway Documentation:    Vent end date: 11/16/17 Vent end time: 1756   Evaluation  O2 sats: stable throughout Complications: No apparent complications Patient did tolerate procedure well. Bilateral Breath Sounds: Clear, Diminished  NIF-25 FVC-1.1L Yes  Revonda Standard 11/16/2017, 5:56 PM

## 2017-11-16 NOTE — Progress Notes (Signed)
  Echocardiogram Echocardiogram Transesophageal has been performed.  Carl [name] 11/16/2017, 11:03 AM

## 2017-11-16 NOTE — Anesthesia Procedure Notes (Signed)
Central Venous Catheter Insertion Performed by: Roberts Gaudy, MD, anesthesiologist Start/End10/11/2017 6:20 AM, 11/16/2017 6:30 AM Patient location: Pre-op. Preanesthetic checklist: patient identified, IV checked, site marked, risks and benefits discussed, surgical consent, monitors and equipment checked, pre-op evaluation, timeout performed and anesthesia consent Lidocaine 1% used for infiltration and patient sedated Hand hygiene performed  and maximum sterile barriers used  Catheter size: 9 Fr Sheath introducer Procedure performed using ultrasound guided technique. Ultrasound Notes:anatomy identified, needle tip was noted to be adjacent to the nerve/plexus identified, no ultrasound evidence of intravascular and/or intraneural injection and image(s) printed for medical record Attempts: 1 Following insertion, line sutured and dressing applied. Post procedure assessment: blood return through all ports, free fluid flow and no air  Patient tolerated the procedure well with no immediate complications.

## 2017-11-16 NOTE — Progress Notes (Signed)
      [address]       [place] [address]             [phone]                                                             Pre Procedure note for inpatients:   Carl [name] has been scheduled for Procedure(s): CORONARY ARTERY BYPASS GRAFTING (CABG) (N/A) TRANSESOPHAGEAL ECHOCARDIOGRAM (TEE) (N/A) today. The various methods of treatment have been discussed with the patient. After consideration of the risks, benefits and treatment options the patient has consented to the planned procedure.   The patient has been seen and labs reviewed. There are no changes in the patient's condition to prevent proceeding with the planned procedure today.  Recent labs:  Lab Results  Component Value Date   WBC 7.3 11/16/2017   HGB 14.2 11/16/2017   HCT 43.5 11/16/2017   PLT 267 11/16/2017   GLUCOSE 112 (H) 11/16/2017   CHOL 170 11/14/2017   TRIG 231 (H) 11/14/2017   HDL 48 11/14/2017   LDLCALC 76 11/14/2017   ALT 36 11/15/2017   AST 33 11/15/2017   NA 137 11/16/2017   K 3.9 11/16/2017   CL 105 11/16/2017   CREATININE 0.86 11/16/2017   BUN 11 11/16/2017   CO2 23 11/16/2017   TSH 5.180 (H) 11/14/2017   INR 1.02 11/15/2017   HGBA1C 5.4 11/15/2017    [name], MD 11/16/2017 6:25 AM

## 2017-11-16 NOTE — Progress Notes (Signed)
Patient arrived to the OR alert and oriented. Patient able to confirm name, DOB, procedure, allergies, npo status and no pain. Patient able to move over to OR table with minimal assistance.   Leatha Gilding, RN

## 2017-11-16 NOTE — Brief Op Note (Signed)
11/15/2017 - 11/16/2017  1:23 PM  PATIENT:  Denice Bors  72 y.o. male  PRE-OPERATIVE DIAGNOSIS:  CAD LMD  POST-OPERATIVE DIAGNOSIS:  * No post-op diagnosis entered *  PROCEDURE:  Procedure(s): CORONARY ARTERY BYPASS GRAFTING (CABG) x4. LEFT ENDOSCOPIC SAPHENOUS VEIN HARVEST AND MAMMARY ARTERY TAKE DOWN. LIMA TO LAD, SVG TO PDA, SVG TO DISTAL CIRC & OMI. (N/A) TRANSESOPHAGEAL ECHOCARDIOGRAM (TEE) (N/A)  LIMA to LAD SVG to PDA SVG to OM1 and distal Circ  SURGEON:  Surgeon(s) and Role:    * [name], MD - Primary  PHYSICIAN ASSISTANT:  Nicholes Rough, PA-C   ANESTHESIA:   general  EBL:  625 mL   BLOOD ADMINISTERED:none  DRAINS: ROUTINE   LOCAL MEDICATIONS USED:  NONE  SPECIMEN:  No Specimen  DISPOSITION OF SPECIMEN:  N/A  COUNTS:  YES  DICTATION: .Dragon Dictation  PLAN OF CARE: Admit to inpatient   PATIENT DISPOSITION:  ICU - intubated and hemodynamically stable.   Delay start of Pharmacological VTE agent (>24hrs) due to surgical blood loss or risk of bleeding: yes

## 2017-11-16 NOTE — Anesthesia Procedure Notes (Deleted)
Central Venous Catheter Insertion Performed by: Roberts Gaudy, MD, anesthesiologist Start/End10/11/2017 6:50 AM, 11/16/2017 7:00 AM Patient location: Pre-op. Preanesthetic checklist: patient identified, IV checked, site marked, risks and benefits discussed, surgical consent, monitors and equipment checked, pre-op evaluation, timeout performed and anesthesia consent Lidocaine 1% used for infiltration and patient sedated Hand hygiene performed  and maximum sterile barriers used  Catheter size: 9 Fr Sheath introducer Procedure performed using ultrasound guided technique. Ultrasound Notes:anatomy identified, needle tip was noted to be adjacent to the nerve/plexus identified, no ultrasound evidence of intravascular and/or intraneural injection and image(s) printed for medical record Attempts: 1 Following insertion, line sutured and dressing applied. Post procedure assessment: blood return through all ports, free fluid flow and no air  Patient tolerated the procedure well with no immediate complications.

## 2017-11-16 NOTE — Anesthesia Procedure Notes (Signed)
Arterial Line Insertion Start/End10/11/2017 6:45 AM, 11/16/2017 6:52 AM Performed by: CRNA  Preanesthetic checklist: patient identified, IV checked, site marked, risks and benefits discussed, surgical consent, monitors and equipment checked, pre-op evaluation, timeout performed and anesthesia consent Lidocaine 1% used for infiltration and patient sedated Left, radial was placed Catheter size: 20 G Hand hygiene performed , maximum sterile barriers used  and Seldinger technique used Allen's test indicative of satisfactory collateral circulation Attempts: 2 Procedure performed without using ultrasound guided technique. Following insertion, Biopatch and dressing applied. Post procedure assessment: normal  Patient tolerated the procedure well with no immediate complications.

## 2017-11-16 NOTE — Transfer of Care (Signed)
Immediate Anesthesia Transfer of Care Note  Patient: Carl [name]  Procedure(s) Performed: CORONARY ARTERY BYPASS GRAFTING (CABG) x4. LEFT ENDOSCOPIC SAPHENOUS VEIN HARVEST AND MAMMARY ARTERY TAKE DOWN. LIMA TO LAD, SVG TO PDA, SVG TO DISTAL CIRC & OMI. (N/A Chest) TRANSESOPHAGEAL ECHOCARDIOGRAM (TEE) (N/A )  Patient Location: SICU  Anesthesia Type:General  Level of Consciousness: sedated, unresponsive and Patient remains intubated per anesthesia plan  Airway & Oxygen Therapy: Patient remains intubated per anesthesia plan and Patient placed on Ventilator (see vital sign flow sheet for setting)  Post-op Assessment: Report given to RN and Post -op Vital signs reviewed and stable  Post vital signs: Reviewed and stable  Last Vitals:  Vitals Value Taken Time  BP    Temp    Pulse 89 11/16/2017  1:51 PM  Resp 12 11/16/2017  1:51 PM  SpO2 94 % 11/16/2017  1:51 PM  Vitals shown include unvalidated device data.  Last Pain:  Vitals:   11/16/17 0527  TempSrc: Oral  PainSc:       Patients Stated Pain Goal: 0 (49/67/59 1638)  Complications: No apparent anesthesia complications

## 2017-11-17 ENCOUNTER — Encounter (HOSPITAL_COMMUNITY): Payer: Self-pay | Admitting: Cardiothoracic Surgery

## 2017-11-17 ENCOUNTER — Inpatient Hospital Stay (HOSPITAL_COMMUNITY): Payer: PPO

## 2017-11-17 LAB — CBC
HCT: 32.6 % — ABNORMAL LOW (ref 39.0–52.0)
HEMATOCRIT: 33.1 % — AB (ref 39.0–52.0)
HEMOGLOBIN: 10.8 g/dL — AB (ref 13.0–17.0)
HEMOGLOBIN: 10.7 g/dL — AB (ref 13.0–17.0)
MCH: 32 pg (ref 26.0–34.0)
MCH: 33 pg (ref 26.0–34.0)
MCHC: 32.6 g/dL (ref 30.0–36.0)
MCHC: 32.8 g/dL (ref 30.0–36.0)
MCV: 98.2 fL (ref 80.0–100.0)
MCV: 100.6 fL — ABNORMAL HIGH (ref 80.0–100.0)
Platelets: 209 10*3/uL (ref 150–400)
Platelets: 201 10*3/uL (ref 150–400)
RBC: 3.37 MIL/uL — ABNORMAL LOW (ref 4.22–5.81)
RBC: 3.24 MIL/uL — AB (ref 4.22–5.81)
RDW: 12.7 % (ref 11.5–15.5)
RDW: 13.1 % (ref 11.5–15.5)
WBC: 14.2 10*3/uL — AB (ref 4.0–10.5)
WBC: 14.1 10*3/uL — AB (ref 4.0–10.5)
nRBC: 0 % (ref 0.0–0.2)
nRBC: 0 % (ref 0.0–0.2)

## 2017-11-17 LAB — GLUCOSE, CAPILLARY
GLUCOSE-CAPILLARY: 104 mg/dL — AB (ref 70–99)
GLUCOSE-CAPILLARY: 116 mg/dL — AB (ref 70–99)
GLUCOSE-CAPILLARY: 129 mg/dL — AB (ref 70–99)
GLUCOSE-CAPILLARY: 138 mg/dL — AB (ref 70–99)
GLUCOSE-CAPILLARY: 77 mg/dL (ref 70–99)
GLUCOSE-CAPILLARY: 87 mg/dL (ref 70–99)
Glucose-Capillary: 128 mg/dL — ABNORMAL HIGH (ref 70–99)
Glucose-Capillary: 123 mg/dL — ABNORMAL HIGH (ref 70–99)
Glucose-Capillary: 103 mg/dL — ABNORMAL HIGH (ref 70–99)
Glucose-Capillary: 111 mg/dL — ABNORMAL HIGH (ref 70–99)
Glucose-Capillary: 122 mg/dL — ABNORMAL HIGH (ref 70–99)

## 2017-11-17 LAB — BASIC METABOLIC PANEL
ANION GAP: 8 (ref 5–15)
BUN: 12 mg/dL (ref 8–23)
CHLORIDE: 107 mmol/L (ref 98–111)
CO2: 22 mmol/L (ref 22–32)
Calcium: 8.1 mg/dL — ABNORMAL LOW (ref 8.9–10.3)
Creatinine, Ser: 0.78 mg/dL (ref 0.61–1.24)
GFR calc non Af Amer: >60 mL/min (ref >60)
GLUCOSE: 123 mg/dL — AB (ref 70–99)
Potassium: 4.4 mmol/L (ref 3.5–5.1)
Sodium: 137 mmol/L (ref 135–145)

## 2017-11-17 LAB — POCT I-STAT, CHEM 8
BUN: 19 mg/dL (ref 8–23)
CHLORIDE: 101 mmol/L (ref 98–111)
CREATININE: 1.3 mg/dL — AB (ref 0.61–1.24)
Calcium, Ion: 1.19 mmol/L (ref 1.15–1.40)
GLUCOSE: 138 mg/dL — AB (ref 70–99)
HEMATOCRIT: 32 % — AB (ref 39.0–52.0)
Hemoglobin: 10.9 g/dL — ABNORMAL LOW (ref 13.0–17.0)
POTASSIUM: 4.7 mmol/L (ref 3.5–5.1)
Sodium: 135 mmol/L (ref 135–145)
TCO2: 23 mmol/L (ref 22–32)

## 2017-11-17 LAB — ECHO TEE
AV Mean grad: 5 mmHg
Ao-asc: 3.7 cm
LVOT diameter: 20 mm
Mean grad: 1 mmHg
STJ: 2.4 cm
Sinus: 3 cm

## 2017-11-17 LAB — CREATININE, SERUM
Creatinine, Ser: 1.31 mg/dL — ABNORMAL HIGH (ref 0.61–1.24)
GFR, EST NON AFRICAN AMERICAN: 52 mL/min — AB (ref >60)

## 2017-11-17 LAB — MAGNESIUM
MAGNESIUM: 2.5 mg/dL — AB (ref 1.7–2.4)
Magnesium: 2.4 mg/dL (ref 1.7–2.4)

## 2017-11-17 MED ORDER — ACYCLOVIR 400 MG PO TABS
400.0000 mg | ORAL_TABLET | Freq: Two times a day (BID) | ORAL | Status: DC
  Administered 2017-11-17 – 2017-11-22 (×10): 400 mg via ORAL
  Filled 2017-11-17 (×10): qty 1

## 2017-11-17 MED ORDER — INSULIN ASPART 100 UNIT/ML ~~LOC~~ SOLN
0.0000 [IU] | SUBCUTANEOUS | Status: DC
  Administered 2017-11-17 – 2017-11-19 (×7): 2 [IU] via SUBCUTANEOUS

## 2017-11-17 MED ORDER — ENOXAPARIN SODIUM 30 MG/0.3ML ~~LOC~~ SOLN
30.0000 mg | Freq: Every day | SUBCUTANEOUS | Status: DC
  Administered 2017-11-17 – 2017-11-20 (×4): 30 mg via SUBCUTANEOUS
  Filled 2017-11-17 (×4): qty 0.3

## 2017-11-17 MED ORDER — FUROSEMIDE 10 MG/ML IJ SOLN
40.0000 mg | Freq: Once | INTRAMUSCULAR | Status: AC
  Administered 2017-11-17: 40 mg via INTRAVENOUS
  Filled 2017-11-17: qty 4

## 2017-11-17 MED ORDER — ZOLPIDEM TARTRATE 5 MG PO TABS
5.0000 mg | ORAL_TABLET | Freq: Every day | ORAL | Status: DC
  Administered 2017-11-17 – 2017-11-19 (×3): 5 mg via ORAL
  Filled 2017-11-17 (×3): qty 1

## 2017-11-17 MED ORDER — ZOLPIDEM TARTRATE 5 MG PO TABS: 10.0000 mg | ORAL_TABLET | Freq: Every day | ORAL | Status: DC

## 2017-11-17 MED ORDER — GUAIFENESIN ER 600 MG PO TB12
600.0000 mg | ORAL_TABLET | Freq: Two times a day (BID) | ORAL | Status: DC
  Administered 2017-11-17 – 2017-11-22 (×11): 600 mg via ORAL
  Filled 2017-11-17 (×11): qty 1

## 2017-11-17 MED ORDER — INSULIN ASPART 100 UNIT/ML ~~LOC~~ SOLN
0.0000 [IU] | SUBCUTANEOUS | Status: DC
  Administered 2017-11-17: 2 [IU] via SUBCUTANEOUS

## 2017-11-17 MED ORDER — METOCLOPRAMIDE HCL 5 MG/ML IJ SOLN
10.0000 mg | Freq: Three times a day (TID) | INTRAMUSCULAR | Status: AC
  Administered 2017-11-17 (×3): 10 mg via INTRAVENOUS
  Filled 2017-11-17 (×4): qty 2

## 2017-11-17 MED FILL — Magnesium Sulfate Inj 50%: INTRAMUSCULAR | Qty: 10 | Status: AC

## 2017-11-17 MED FILL — Electrolyte-R (PH 7.4) Solution: INTRAVENOUS | Qty: 4000 | Status: AC

## 2017-11-17 MED FILL — Sodium Chloride IV Soln 0.9%: INTRAVENOUS | Qty: 2000 | Status: AC

## 2017-11-17 MED FILL — Sodium Bicarbonate IV Soln 8.4%: INTRAVENOUS | Qty: 50 | Status: AC

## 2017-11-17 MED FILL — Heparin Sodium (Porcine) Inj 1000 Unit/ML: INTRAMUSCULAR | Qty: 30 | Status: AC

## 2017-11-17 MED FILL — Heparin Sodium (Porcine) Inj 1000 Unit/ML: INTRAMUSCULAR | Qty: 10 | Status: AC

## 2017-11-17 MED FILL — Potassium Chloride Inj 2 mEq/ML: INTRAVENOUS | Qty: 40 | Status: AC

## 2017-11-17 MED FILL — Lidocaine HCl(Cardiac) IV PF Soln Pref Syr 100 MG/5ML (2%): INTRAVENOUS | Qty: 5 | Status: AC

## 2017-11-17 MED FILL — Mannitol IV Soln 20%: INTRAVENOUS | Qty: 500 | Status: AC

## 2017-11-17 NOTE — Progress Notes (Signed)
Patient ID: Carl [name], male   DOB: 08/11/1945, 72 y.o.   MRN: [id number] EVENING ROUNDS NOTE :     [address]       [place] [address]             [phone]                 1 Day Post-Op Procedure(s) (LRB): CORONARY ARTERY BYPASS GRAFTING (CABG) x4. LEFT ENDOSCOPIC SAPHENOUS VEIN HARVEST AND MAMMARY ARTERY TAKE DOWN. LIMA TO LAD, SVG TO PDA, SVG TO DISTAL CIRC & OMI. (N/A) TRANSESOPHAGEAL ECHOCARDIOGRAM (TEE) (N/A)  Total Length of Stay:  LOS: 2 days  BP 103/61   Pulse 73   Temp 98.5 F (36.9 C) (Oral)   Resp 15   Ht 5\' 8"  (1.727 m)   Wt 97.9 kg   SpO2 91%   BMI 32.82 kg/m   .Intake/Output      10/11 0701 - 10/12 0700   P.O. 390   I.V. (mL/kg) 253 (2.6)   Blood    IV Piggyback 200   Total Intake(mL/kg) 843 (8.6)   Urine (mL/kg/hr) 375 (0.3)   Blood    Chest Tube 350   Total Output 725   Net +118         . sodium chloride 20 mL/hr at 11/17/17 2000  . sodium chloride    . sodium chloride 20 mL/hr at 11/16/17 1345  . cefUROXime (ZINACEF)  IV Stopped (11/17/17 1543)  . dexmedetomidine (PRECEDEX) IV infusion Stopped (11/16/17 1658)  . lactated ringers    . lactated ringers    . lactated ringers Stopped (11/17/17 0913)  . nitroGLYCERIN Stopped (11/16/17 1350)  . phenylephrine (NEO-SYNEPHRINE) Adult infusion Stopped (11/17/17 0734)     Lab Results  Component Value Date   WBC 14.1 (H) 11/17/2017   HGB 10.9 (L) 11/17/2017   HCT 32.0 (L) 11/17/2017   PLT 201 11/17/2017   GLUCOSE 138 (H) 11/17/2017   CHOL 170 11/14/2017   TRIG 231 (H) 11/14/2017   HDL 48 11/14/2017   LDLCALC 76 11/14/2017   ALT 36 11/15/2017   AST 33 11/15/2017   NA 135 11/17/2017   K 4.7 11/17/2017   CL 101 11/17/2017   CREATININE 1.30 (H) 11/17/2017   BUN 19 11/17/2017   CO2 22 11/17/2017   TSH 5.180 (H) 11/14/2017   INR 1.35 11/16/2017   HGBA1C 5.4 11/15/2017    stable , walked twice around unit   [name] MD  Beeper [phone] Office  [phone] 11/17/2017 8:24 PM

## 2017-11-17 NOTE — Anesthesia Postprocedure Evaluation (Signed)
Anesthesia Post Note  Patient: Carl [name]  Procedure(s) Performed: CORONARY ARTERY BYPASS GRAFTING (CABG) x4. LEFT ENDOSCOPIC SAPHENOUS VEIN HARVEST AND MAMMARY ARTERY TAKE DOWN. LIMA TO LAD, SVG TO PDA, SVG TO DISTAL CIRC & OMI. (N/A Chest) TRANSESOPHAGEAL ECHOCARDIOGRAM (TEE) (N/A )     Patient location during evaluation: SICU Anesthesia Type: General Level of consciousness: sedated Pain management: pain level controlled Vital Signs Assessment: post-procedure vital signs reviewed and stable Respiratory status: respiratory function stable and patient remains intubated per anesthesia plan Cardiovascular status: stable Postop Assessment: no apparent nausea or vomiting Anesthetic complications: no    Last Vitals:  Vitals:   11/17/17 0640 11/17/17 0700  BP:    Pulse: 89 89  Resp: (!) 24 15  Temp: 37.2 C 37.2 C  SpO2: 90% 91%    Last Pain:  Vitals:   11/17/17 0626  TempSrc:   PainSc: 1                  Murray Durrell

## 2017-11-17 NOTE — Progress Notes (Signed)
Patient ID: Carl [name], male   DOB: 1945-05-04, 72 y.o.   MRN: [id number] TCTS DAILY ICU PROGRESS NOTE                   [address]            ,[place] [address]          [phone]   1 Day Post-Op Procedure(s) (LRB): CORONARY ARTERY BYPASS GRAFTING (CABG) x4. LEFT ENDOSCOPIC SAPHENOUS VEIN HARVEST AND MAMMARY ARTERY TAKE DOWN. LIMA TO LAD, SVG TO PDA, SVG TO DISTAL CIRC & OMI. (N/A) TRANSESOPHAGEAL ECHOCARDIOGRAM (TEE) (N/A)  Total Length of Stay:  LOS: 2 days   Subjective: Feels well this a.m. alert calling family members on the phone.  Objective: Vital signs in last 24 hours: Temp:  [96.3 F (35.7 C)-99.1 F (37.3 C)] 99 F (37.2 C) (10/11 0700) Pulse Rate:  [89-97] 89 (10/11 0700) Cardiac Rhythm: Sinus bradycardia;Bundle branch block (10/11 0700) Resp:  [10-32] 15 (10/11 0700) BP: (81-116)/(59-84) 102/61 (10/11 0300) SpO2:  [87 %-100 %] 91 % (10/11 0700) Arterial Line BP: (77-140)/(47-64) 120/51 (10/11 0700) FiO2 (%):  [40 %-50 %] 40 % (10/10 1720) Weight:  [97.9 kg] 97.9 kg (10/11 0500)  Filed Weights   11/15/17 1903 11/16/17 0517 11/17/17 0500  Weight: 88.3 kg 87.7 kg 97.9 kg    Weight change: 9.6 kg   Hemodynamic parameters for last 24 hours: PAP: (15-36)/(9-22) 32/12 CO:  [3.7 L/min-5.4 L/min] 4.7 L/min CI:  [1.8 L/min/m2-2.7 L/min/m2] 2.3 L/min/m2  Intake/Output from previous day: 10/10 0701 - 10/11 0700 In: 7338.9 [I.V.:4688.2; Blood:800; IV Piggyback:1850.7] Out: 5050 [Urine:3775; Blood:625; Chest Tube:650]  Intake/Output this shift: No intake/output data recorded.  Current Meds: Scheduled Meds: . acetaminophen  1,000 mg Oral Q6H   Or  . acetaminophen (TYLENOL) oral liquid 160 mg/5 mL  1,000 mg Per Tube Q6H  . aspirin EC  325 mg Oral Daily   Or  . aspirin  324 mg Per Tube Daily  . bisacodyl  10 mg Oral Daily   Or  . bisacodyl  10 mg Rectal Daily  . Chlorhexidine Gluconate Cloth  6 each Topical Daily  . citalopram  20 mg  Oral Daily  . docusate sodium  200 mg Oral Daily  . folic acid  1 mg Oral Daily  . insulin aspart  0-24 Units Subcutaneous Q4H  . mouth rinse  15 mL Mouth Rinse BID  . metoprolol tartrate  12.5 mg Oral BID   Or  . metoprolol tartrate  12.5 mg Per Tube BID  . [START ON 11/18/2017] pantoprazole  40 mg Oral Daily  . simvastatin  40 mg Oral QPM  . sodium chloride flush  10-40 mL Intracatheter Q12H  . sodium chloride flush  3 mL Intravenous Q12H  . thiamine  50 mg Oral Daily   Continuous Infusions: . sodium chloride 20 mL/hr at 11/17/17 0700  . sodium chloride    . sodium chloride 20 mL/hr at 11/16/17 1345  . cefUROXime (ZINACEF)  IV Stopped (11/17/17 0431)  . dexmedetomidine (PRECEDEX) IV infusion Stopped (11/16/17 1658)  . lactated ringers    . lactated ringers    . lactated ringers 20 mL/hr at 11/17/17 0700  . nitroGLYCERIN Stopped (11/16/17 1350)  . phenylephrine (NEO-SYNEPHRINE) Adult infusion 5 mcg/min (11/17/17 0700)   PRN Meds:.sodium chloride, lactated ringers, metoprolol tartrate, midazolam, morphine injection, ondansetron (ZOFRAN) IV, oxyCODONE, sodium chloride flush, sodium chloride flush, traMADol  General appearance: alert, cooperative and  no distress Neurologic: intact Heart: regular rate and rhythm, S1, S2 normal, no murmur, click, rub or gallop Lungs: diminished breath sounds bilaterally Abdomen: Mild abdominal distention with hypoactive bowel sounds Extremities: extremities normal, atraumatic, no cyanosis or edema Wound: Sternum stable dressing intact  Lab Results: CBC: Recent Labs    11/16/17 1952 11/16/17 1958 11/17/17 0457  WBC 15.9*  --  14.2*  HGB 12.2* 11.9* 10.8*  HCT 36.6* 35.0* 33.1*  PLT 210  --  209   BMET:  Recent Labs    11/16/17 0409  11/16/17 1958 11/17/17 0457  NA 137   < > 140 137  K 3.9   < > 4.7 4.4  CL 105   < > 105 107  CO2 23  --   --  22  GLUCOSE 112*   < > 134* 123*  BUN 11   < > 11 12  CREATININE 0.86   < > 0.70 0.78    CALCIUM 8.7*  --   --  8.1*   < > = values in this interval not displayed.    CMET: Lab Results  Component Value Date   WBC 14.2 (H) 11/17/2017   HGB 10.8 (L) 11/17/2017   HCT 33.1 (L) 11/17/2017   PLT 209 11/17/2017   GLUCOSE 123 (H) 11/17/2017   CHOL 170 11/14/2017   TRIG 231 (H) 11/14/2017   HDL 48 11/14/2017   LDLCALC 76 11/14/2017   ALT 36 11/15/2017   AST 33 11/15/2017   NA 137 11/17/2017   K 4.4 11/17/2017   CL 107 11/17/2017   CREATININE 0.78 11/17/2017   BUN 12 11/17/2017   CO2 22 11/17/2017   TSH 5.180 (H) 11/14/2017   INR 1.35 11/16/2017   HGBA1C 5.4 11/15/2017      PT/INR:  Recent Labs    11/16/17 1354  LABPROT 16.6*  INR 1.35   Radiology: Dg Chest Port 1 View  Result Date: 11/16/2017 CLINICAL DATA:  Post CABG EXAM: PORTABLE CHEST 1 VIEW COMPARISON:  11/16/2017 FINDINGS: Status post median sternotomy and CABG. Endotracheal tube is in place 5.7 centimeters above the carina. A RIGHT IJ Swan-Ganz catheter is in place, tip overlying the level of the pulmonary outflow tract. A LEFT-sided chest tube is in place. Heart size is mildly enlarged. There are perihilar changes of mild pulmonary edema. No pneumothorax. IMPRESSION: Postoperative changes.  Mild edema. Electronically Signed   By: Nolon Nations M.D.   On: 11/16/2017 14:49     Assessment/Plan: S/P Procedure(s) (LRB): CORONARY ARTERY BYPASS GRAFTING (CABG) x4. LEFT ENDOSCOPIC SAPHENOUS VEIN HARVEST AND MAMMARY ARTERY TAKE DOWN. LIMA TO LAD, SVG TO PDA, SVG TO DISTAL CIRC & OMI. (N/A) TRANSESOPHAGEAL ECHOCARDIOGRAM (TEE) (N/A) Mobilize Diuresis d/c tubes/lines See progression orders Expected Acute  Blood - loss Anemia- continue to monitor  Preop labs indicate mildly elevated TSH EKG indicates incomplete right bundle branch block mild diffuse ST changes consistent with pericarditis Renal function stable Mild ileus, will wait on regular diet, start Reglan and clear liquids only  [name] 11/17/2017 7:53 AM

## 2017-11-18 ENCOUNTER — Inpatient Hospital Stay (HOSPITAL_COMMUNITY): Payer: PPO

## 2017-11-18 ENCOUNTER — Inpatient Hospital Stay: Payer: Self-pay

## 2017-11-18 ENCOUNTER — Other Ambulatory Visit: Payer: Self-pay

## 2017-11-18 DIAGNOSIS — E785 Hyperlipidemia, unspecified: Secondary | ICD-10-CM

## 2017-11-18 DIAGNOSIS — Z951 Presence of aortocoronary bypass graft: Secondary | ICD-10-CM

## 2017-11-18 DIAGNOSIS — I4891 Unspecified atrial fibrillation: Secondary | ICD-10-CM

## 2017-11-18 LAB — GLUCOSE, CAPILLARY
GLUCOSE-CAPILLARY: 134 mg/dL — AB (ref 70–99)
Glucose-Capillary: 107 mg/dL — ABNORMAL HIGH (ref 70–99)
Glucose-Capillary: 109 mg/dL — ABNORMAL HIGH (ref 70–99)
Glucose-Capillary: 111 mg/dL — ABNORMAL HIGH (ref 70–99)
Glucose-Capillary: 123 mg/dL — ABNORMAL HIGH (ref 70–99)
Glucose-Capillary: 130 mg/dL — ABNORMAL HIGH (ref 70–99)
Glucose-Capillary: 135 mg/dL — ABNORMAL HIGH (ref 70–99)

## 2017-11-18 LAB — BASIC METABOLIC PANEL
ANION GAP: 10 (ref 5–15)
BUN: 23 mg/dL (ref 8–23)
CO2: 23 mmol/L (ref 22–32)
Calcium: 8.5 mg/dL — ABNORMAL LOW (ref 8.9–10.3)
Chloride: 102 mmol/L (ref 98–111)
Creatinine, Ser: 1.23 mg/dL (ref 0.61–1.24)
GFR calc Af Amer: >60 mL/min (ref >60)
GFR calc non Af Amer: 56 mL/min — ABNORMAL LOW (ref >60)
Glucose, Bld: 122 mg/dL — ABNORMAL HIGH (ref 70–99)
POTASSIUM: 4.4 mmol/L (ref 3.5–5.1)
Sodium: 135 mmol/L (ref 135–145)

## 2017-11-18 LAB — CBC
HCT: 32.6 % — ABNORMAL LOW (ref 39.0–52.0)
Hemoglobin: 10.3 g/dL — ABNORMAL LOW (ref 13.0–17.0)
MCH: 32.2 pg (ref 26.0–34.0)
MCHC: 31.6 g/dL (ref 30.0–36.0)
MCV: 101.9 fL — ABNORMAL HIGH (ref 80.0–100.0)
PLATELETS: 190 10*3/uL (ref 150–400)
RBC: 3.2 MIL/uL — ABNORMAL LOW (ref 4.22–5.81)
RDW: 13.1 % (ref 11.5–15.5)
WBC: 15.3 10*3/uL — ABNORMAL HIGH (ref 4.0–10.5)
nRBC: 0 % (ref 0.0–0.2)

## 2017-11-18 LAB — HEPATIC FUNCTION PANEL
ALT: 19 U/L (ref 0–44)
AST: 29 U/L (ref 15–41)
Albumin: 3.4 g/dL — ABNORMAL LOW (ref 3.5–5.0)
Alkaline Phosphatase: 44 U/L (ref 38–126)
Bilirubin, Direct: 0.2 mg/dL (ref 0.0–0.2)
Indirect Bilirubin: 0.6 mg/dL (ref 0.3–0.9)
Total Bilirubin: 0.8 mg/dL (ref 0.3–1.2)
Total Protein: 5.7 g/dL — ABNORMAL LOW (ref 6.5–8.1)

## 2017-11-18 LAB — TSH: TSH: 7.798 u[IU]/mL — ABNORMAL HIGH (ref 0.350–4.500)

## 2017-11-18 LAB — MAGNESIUM: Magnesium: 2.4 mg/dL (ref 1.7–2.4)

## 2017-11-18 MED ORDER — AMIODARONE HCL 200 MG PO TABS: 200.0000 mg | ORAL_TABLET | Freq: Every day | ORAL | Status: DC

## 2017-11-18 MED ORDER — ROSUVASTATIN CALCIUM 10 MG PO TABS
40.0000 mg | ORAL_TABLET | Freq: Every day | ORAL | Status: DC
  Administered 2017-11-18 – 2017-11-21 (×4): 40 mg via ORAL
  Filled 2017-11-18 (×2): qty 4
  Filled 2017-11-18: qty 1
  Filled 2017-11-18: qty 4

## 2017-11-18 MED ORDER — FUROSEMIDE 10 MG/ML IJ SOLN
40.0000 mg | Freq: Once | INTRAMUSCULAR | Status: AC
  Administered 2017-11-18: 40 mg via INTRAVENOUS
  Filled 2017-11-18: qty 4

## 2017-11-18 MED ORDER — AMIODARONE HCL IN DEXTROSE 360-4.14 MG/200ML-% IV SOLN
INTRAVENOUS | Status: AC
  Filled 2017-11-18: qty 200

## 2017-11-18 MED ORDER — AMIODARONE LOAD VIA INFUSION
150.0000 mg | Freq: Once | INTRAVENOUS | Status: AC
  Administered 2017-11-18: 150 mg via INTRAVENOUS
  Filled 2017-11-18: qty 83.34

## 2017-11-18 MED ORDER — AMIODARONE HCL IN DEXTROSE 360-4.14 MG/200ML-% IV SOLN
60.0000 mg/h | INTRAVENOUS | Status: DC
  Administered 2017-11-18 (×2): 60 mg/h via INTRAVENOUS
  Filled 2017-11-18: qty 200

## 2017-11-18 MED ORDER — AMIODARONE HCL 200 MG PO TABS
200.0000 mg | ORAL_TABLET | Freq: Two times a day (BID) | ORAL | Status: DC
  Filled 2017-11-18: qty 1

## 2017-11-18 MED ORDER — AMIODARONE HCL IN DEXTROSE 360-4.14 MG/200ML-% IV SOLN
30.0000 mg/h | INTRAVENOUS | Status: DC
  Administered 2017-11-18 – 2017-11-19 (×2): 30 mg/h via INTRAVENOUS
  Filled 2017-11-18 (×2): qty 200

## 2017-11-18 NOTE — Progress Notes (Signed)
Patient ID: Carl [name], male   DOB: 1945-10-26, 72 y.o.   MRN: [id number] TCTS DAILY ICU PROGRESS NOTE                   [address]            [place] [address]          [phone]   2 Days Post-Op Procedure(s) (LRB): CORONARY ARTERY BYPASS GRAFTING (CABG) x4. LEFT ENDOSCOPIC SAPHENOUS VEIN HARVEST AND MAMMARY ARTERY TAKE DOWN. LIMA TO LAD, SVG TO PDA, SVG TO DISTAL CIRC & OMI. (N/A) TRANSESOPHAGEAL ECHOCARDIOGRAM (TEE) (N/A)  Total Length of Stay:  LOS: 3 days   Subjective: Patient awake alert neurologically intact, developed rapid atrial fib while walking around the unit this morning, has been started on IV amiodarone bolus and drip  objective: Vital signs in last 24 hours: Temp:  [98.5 F (36.9 C)-99.1 F (37.3 C)] 98.6 F (37 C) (10/12 0414) Pulse Rate:  [71-105] 80 (10/12 0700) Cardiac Rhythm: Normal sinus rhythm (10/11 2000) Resp:  [12-31] 29 (10/12 0700) BP: (92-120)/(56-72) 102/69 (10/12 0700) SpO2:  [91 %-100 %] 97 % (10/12 0700) Arterial Line BP: (123)/(53) 123/53 (10/11 0900) Weight:  [93.4 kg] 93.4 kg (10/12 0500)  Filed Weights   11/16/17 0517 11/17/17 0500 11/18/17 0500  Weight: 87.7 kg 97.9 kg 93.4 kg    Weight change: -4.5 kg   Hemodynamic parameters for last 24 hours:    Intake/Output from previous day: 10/11 0701 - 10/12 0700 In: 1142.4 [P.O.:390; I.V.:452.4; IV Piggyback:300] Out: 1625 [Urine:1075; Chest Tube:550]  Intake/Output this shift: No intake/output data recorded.  Current Meds: Scheduled Meds: . acetaminophen  1,000 mg Oral Q6H   Or  . acetaminophen (TYLENOL) oral liquid 160 mg/5 mL  1,000 mg Per Tube Q6H  . acyclovir  400 mg Oral BID  . amiodarone  200 mg Oral Q12H   Followed by  . [START ON 11/26/2017] amiodarone  200 mg Oral Daily  . aspirin EC  325 mg Oral Daily   Or  . aspirin  324 mg Per Tube Daily  . bisacodyl  10 mg Oral Daily   Or  . bisacodyl  10 mg Rectal Daily  . Chlorhexidine Gluconate Cloth  6  each Topical Daily  . citalopram  20 mg Oral Daily  . docusate sodium  200 mg Oral Daily  . enoxaparin (LOVENOX) injection  30 mg Subcutaneous QHS  . folic acid  1 mg Oral Daily  . guaiFENesin  600 mg Oral BID  . insulin aspart  0-24 Units Subcutaneous Q4H  . insulin aspart  0-24 Units Subcutaneous Q4H  . mouth rinse  15 mL Mouth Rinse BID  . metoprolol tartrate  12.5 mg Oral BID   Or  . metoprolol tartrate  12.5 mg Per Tube BID  . pantoprazole  40 mg Oral Daily  . simvastatin  40 mg Oral QPM  . sodium chloride flush  10-40 mL Intracatheter Q12H  . sodium chloride flush  3 mL Intravenous Q12H  . thiamine  50 mg Oral Daily  . zolpidem  5 mg Oral QHS   Continuous Infusions: . sodium chloride 20 mL/hr at 11/18/17 0647  . sodium chloride    . sodium chloride 20 mL/hr at 11/16/17 1345  . amiodarone 60 mg/hr (11/18/17 0700)   Followed by  . amiodarone    . dexmedetomidine (PRECEDEX) IV infusion Stopped (11/16/17 1658)  . lactated ringers    . lactated  ringers 20 mL/hr at 11/18/17 0700  . lactated ringers Stopped (11/17/17 0913)  . nitroGLYCERIN Stopped (11/16/17 1350)  . phenylephrine (NEO-SYNEPHRINE) Adult infusion Stopped (11/17/17 0734)   PRN Meds:.sodium chloride, lactated ringers, metoprolol tartrate, midazolam, morphine injection, ondansetron (ZOFRAN) IV, oxyCODONE, sodium chloride flush, sodium chloride flush, traMADol  General appearance: alert and cooperative Neurologic: intact Heart: irregularly irregular rhythm Lungs: diminished breath sounds bibasilar Abdomen: soft, non-tender; bowel sounds normal; no masses,  no organomegaly Extremities: extremities normal, atraumatic, no cyanosis or edema and Homans sign is negative, no sign of DVT Wound: Sternum stable  Lab Results: CBC: Recent Labs    11/17/17 1613 11/17/17 1614 11/18/17 0607  WBC 14.1*  --  15.3*  HGB 10.7* 10.9* 10.3*  HCT 32.6* 32.0* 32.6*  PLT 201  --  190   BMET:  Recent Labs    11/17/17 0457   11/17/17 1614 11/18/17 0500  NA 137  --  135 135  K 4.4  --  4.7 4.4  CL 107  --  101 102  CO2 22  --   --  23  GLUCOSE 123*  --  138* 122*  BUN 12  --  19 23  CREATININE 0.78   < > 1.30* 1.23  CALCIUM 8.1*  --   --  8.5*   < > = values in this interval not displayed.    CMET: Lab Results  Component Value Date   WBC 15.3 (H) 11/18/2017   HGB 10.3 (L) 11/18/2017   HCT 32.6 (L) 11/18/2017   PLT 190 11/18/2017   GLUCOSE 122 (H) 11/18/2017   CHOL 170 11/14/2017   TRIG 231 (H) 11/14/2017   HDL 48 11/14/2017   LDLCALC 76 11/14/2017   ALT 36 11/15/2017   AST 33 11/15/2017   NA 135 11/18/2017   K 4.4 11/18/2017   CL 102 11/18/2017   CREATININE 1.23 11/18/2017   BUN 23 11/18/2017   CO2 23 11/18/2017   TSH 5.180 (H) 11/14/2017   INR 1.35 11/16/2017   HGBA1C 5.4 11/15/2017      PT/INR:  Recent Labs    11/16/17 1354  LABPROT 16.6*  INR 1.35   Radiology: No results found.   Assessment/Plan: S/P Procedure(s) (LRB): CORONARY ARTERY BYPASS GRAFTING (CABG) x4. LEFT ENDOSCOPIC SAPHENOUS VEIN HARVEST AND MAMMARY ARTERY TAKE DOWN. LIMA TO LAD, SVG TO PDA, SVG TO DISTAL CIRC & OMI. (N/A) TRANSESOPHAGEAL ECHOCARDIOGRAM (TEE) (N/A) Mobilize Diuresis Currently on IV amiodarone for new onset of postoperative atrial fibrillation, heart rate slowed to 105 Mild abdominal distention bowel sounds are present    [name] 11/18/2017 8:32 AM

## 2017-11-18 NOTE — Progress Notes (Signed)
Spoke with Margreta Journey RN concerning PICC order. Patient has two functioning PIVs and an introducer. IV medications are compatible at this time. Informed Christina, RN that it's possible that PICC won't be able to be placed until tomorrow.

## 2017-11-18 NOTE — Plan of Care (Signed)
  Problem: Education: Goal: Knowledge of General Education information will improve Description Including pain rating scale, medication(s)/side effects and non-pharmacologic comfort measures 11/18/2017 2327 by London Pepper, RN   Problem: Health Behavior/Discharge Planning: Goal: Ability to manage health-related needs will improve 11/18/2017 2327 by London Pepper, RN Outcome: Progressing   Problem: Clinical Measurements: Goal: Ability to maintain clinical measurements within normal limits will improve 11/18/2017 2327 by London Pepper, RN Outcome: Progressing Goal: Diagnostic test results will improve 11/18/2017 2327 by London Pepper, RN Outcome: Progressing Goal: Cardiovascular complication will be avoided 11/18/2017 2327 by London Pepper, RN Outcome: Progressing   Problem: Activity: Goal: Risk for activity intolerance will decrease 11/18/2017 2327 by London Pepper, RN Outcome: Progressing   Problem: Nutrition: Goal: Adequate nutrition will be maintained 11/18/2017 2327 by London Pepper, RN Outcome: Progressing   Problem: Coping: Goal: Level of anxiety will decrease 11/18/2017 2327 by London Pepper, RN Outcome: Progressing   Problem: Elimination: Goal: Will not experience complications related to urinary retention 11/18/2017 2327 by London Pepper, RN Outcome: Progressing   Problem: Skin Integrity: Goal: Risk for impaired skin integrity will decrease 11/18/2017 2327 by London Pepper, RN Outcome: Progressing   Problem: Education: Goal: Knowledge of disease or condition will improve 11/18/2017 2327 by London Pepper, RN Outcome: Progressing  Goal: Knowledge of the prescribed therapeutic regimen will improve 11/18/2017 2327 by London Pepper, RN Outcome: Progressing  Problem: Activity: Goal: Risk for activity intolerance will decrease 11/18/2017 2327 by London Pepper, RN Outcome: Progressing   Problem: Cardiac: Goal: Will  achieve and/or maintain hemodynamic stability 11/18/2017 2327 by London Pepper, RN Outcome: Progressing  Problem: Clinical Measurements: Goal: Postoperative complications will be avoided or minimized 11/18/2017 2327 by London Pepper, RN Outcome: Progressing   Problem: Skin Integrity: Goal: Wound healing without signs and symptoms of infection 11/18/2017 2327 by London Pepper, RN Outcome: Progressing  Goal: Risk for impaired skin integrity will decrease 11/18/2017 2327 by London Pepper, RN Outcome: Progressing   Problem: Urinary Elimination: Goal: Ability to achieve and maintain adequate renal perfusion and functioning will improve 11/18/2017 2327 by London Pepper, RN Outcome: Progressing  Problem: Clinical Measurements: Goal: Will remain free from infection 11/18/2017 2327 by London Pepper, RN Outcome: Not Progressing  Patient noted with WBC count of 15.3.  Goal: Respiratory complications will improve 11/18/2017 2327 by London Pepper, RN Outcome: Not Progressing  Patient intermittently tachypneic even while at rest, with respiratory rate 30s. Tachpnea and dyspnea not sustained.    Problem: Elimination: Goal: Will not experience complications related to bowel motility 11/18/2017 2327 by London Pepper, RN Outcome: Not Progressing  Patient without BM since Tues (4 days ago). Patient with active bowel sounds and positive for flatus, but with distended abdomen.    Problem: Respiratory: Goal: Respiratory status will improve 11/18/2017 2327 by London Pepper, RN Outcome: Not Progressing  Patient intermittently tachypneic even while at rest, with respiratory rate 30s. Tachpnea and dyspnea not sustained.

## 2017-11-18 NOTE — Progress Notes (Signed)
Patient ID: Carl [name], male   DOB: 09-Oct-1945, 72 y.o.   MRN: [id number] EVENING ROUNDS NOTE :     [address]       [place] [address]             [phone]                 2 Days Post-Op Procedure(s) (LRB): CORONARY ARTERY BYPASS GRAFTING (CABG) x4. LEFT ENDOSCOPIC SAPHENOUS VEIN HARVEST AND MAMMARY ARTERY TAKE DOWN. LIMA TO LAD, SVG TO PDA, SVG TO DISTAL CIRC & OMI. (N/A) TRANSESOPHAGEAL ECHOCARDIOGRAM (TEE) (N/A)  Total Length of Stay:  LOS: 3 days  BP 136/73   Pulse 100   Temp 98.1 F (36.7 C) (Oral)   Resp (!) 34   Ht 5\' 8"  (1.727 m)   Wt 93.4 kg   SpO2 95%   BMI 31.31 kg/m   .Intake/Output      10/12 0701 - 10/13 0700   P.O.    I.V. (mL/kg) 372.6 (4)   IV Piggyback    Total Intake(mL/kg) 372.6 (4)   Urine (mL/kg/hr) 925 (0.8)   Chest Tube    Total Output 925   Net -552.4         . sodium chloride 20 mL/hr at 11/18/17 0647  . sodium chloride    . sodium chloride 20 mL/hr at 11/16/17 1345  . amiodarone 30 mg/hr (11/18/17 1807)  . dexmedetomidine (PRECEDEX) IV infusion Stopped (11/16/17 1658)  . lactated ringers    . lactated ringers 10 mL/hr at 11/18/17 1300  . lactated ringers Stopped (11/17/17 0913)  . nitroGLYCERIN Stopped (11/16/17 1350)  . phenylephrine (NEO-SYNEPHRINE) Adult infusion Stopped (11/17/17 0734)     Lab Results  Component Value Date   WBC 15.3 (H) 11/18/2017   HGB 10.3 (L) 11/18/2017   HCT 32.6 (L) 11/18/2017   PLT 190 11/18/2017   GLUCOSE 122 (H) 11/18/2017   CHOL 170 11/14/2017   TRIG 231 (H) 11/14/2017   HDL 48 11/14/2017   LDLCALC 76 11/14/2017   ALT 19 11/18/2017   AST 29 11/18/2017   NA 135 11/18/2017   K 4.4 11/18/2017   CL 102 11/18/2017   CREATININE 1.23 11/18/2017   BUN 23 11/18/2017   CO2 23 11/18/2017   TSH 7.798 (H) 11/18/2017   INR 1.35 11/16/2017   HGBA1C 5.4 11/15/2017   Still afib, rate slowed and almost converted Foley out  Stable    [name] MD  Beeper  [phone] Office [phone] 11/18/2017 7:05 PM

## 2017-11-18 NOTE — Progress Notes (Signed)
EKG CRITICAL VALUE     12 lead EKG performed.  Critical value noted.  Margreta Journey, RN notified.   Genia Plants, CCT 11/18/2017 11:34 AM

## 2017-11-18 NOTE — Progress Notes (Signed)
Patient ID: Carl [name], male   DOB: 05-18-1945, 72 y.o.   MRN: [id number] EVENING ROUNDS NOTE :     [address]       [place] [address]             [phone]                 2 Days Post-Op Procedure(s) (LRB): CORONARY ARTERY BYPASS GRAFTING (CABG) x4. LEFT ENDOSCOPIC SAPHENOUS VEIN HARVEST AND MAMMARY ARTERY TAKE DOWN. LIMA TO LAD, SVG TO PDA, SVG TO DISTAL CIRC & OMI. (N/A) TRANSESOPHAGEAL ECHOCARDIOGRAM (TEE) (N/A)  Total Length of Stay:  LOS: 3 days  BP 136/73   Pulse 100   Temp 98.1 F (36.7 C) (Oral)   Resp (!) 34   Ht 5\' 8"  (1.727 m)   Wt 93.4 kg   SpO2 95%   BMI 31.31 kg/m   .Intake/Output      10/12 0701 - 10/13 0700   P.O.    I.V. (mL/kg) 372.6 (4)   IV Piggyback    Total Intake(mL/kg) 372.6 (4)   Urine (mL/kg/hr) 925 (0.8)   Chest Tube    Total Output 925   Net -552.4         . sodium chloride 20 mL/hr at 11/18/17 0647  . sodium chloride    . sodium chloride 20 mL/hr at 11/16/17 1345  . amiodarone 30 mg/hr (11/18/17 1807)  . dexmedetomidine (PRECEDEX) IV infusion Stopped (11/16/17 1658)  . lactated ringers    . lactated ringers 10 mL/hr at 11/18/17 1300  . lactated ringers Stopped (11/17/17 0913)  . nitroGLYCERIN Stopped (11/16/17 1350)  . phenylephrine (NEO-SYNEPHRINE) Adult infusion Stopped (11/17/17 0734)     Lab Results  Component Value Date   WBC 15.3 (H) 11/18/2017   HGB 10.3 (L) 11/18/2017   HCT 32.6 (L) 11/18/2017   PLT 190 11/18/2017   GLUCOSE 122 (H) 11/18/2017   CHOL 170 11/14/2017   TRIG 231 (H) 11/14/2017   HDL 48 11/14/2017   LDLCALC 76 11/14/2017   ALT 19 11/18/2017   AST 29 11/18/2017   NA 135 11/18/2017   K 4.4 11/18/2017   CL 102 11/18/2017   CREATININE 1.23 11/18/2017   BUN 23 11/18/2017   CO2 23 11/18/2017   TSH 7.798 (H) 11/18/2017   INR 1.35 11/16/2017   HGBA1C 5.4 11/15/2017   Still a fib but rate slowed   [name] MD  Beeper [phone] Office [phone] 11/18/2017 7:01 PM

## 2017-11-18 NOTE — Progress Notes (Addendum)
Progress Note  Patient Name: Carl [name] Date of Encounter: 11/18/2017  Primary Cardiologist: [name]  Subjective   Day 2 s/P CABG; went into AF RVR earlier today, now on iv amiodarone.  Inpatient Medications    Scheduled Meds: . acetaminophen  1,000 mg Oral Q6H   Or  . acetaminophen (TYLENOL) oral liquid 160 mg/5 mL  1,000 mg Per Tube Q6H  . acyclovir  400 mg Oral BID  . amiodarone  200 mg Oral Q12H   Followed by  . [START ON 11/26/2017] amiodarone  200 mg Oral Daily  . aspirin EC  325 mg Oral Daily   Or  . aspirin  324 mg Per Tube Daily  . bisacodyl  10 mg Oral Daily   Or  . bisacodyl  10 mg Rectal Daily  . Chlorhexidine Gluconate Cloth  6 each Topical Daily  . citalopram  20 mg Oral Daily  . docusate sodium  200 mg Oral Daily  . enoxaparin (LOVENOX) injection  30 mg Subcutaneous QHS  . folic acid  1 mg Oral Daily  . furosemide  40 mg Intravenous Once  . guaiFENesin  600 mg Oral BID  . insulin aspart  0-24 Units Subcutaneous Q4H  . insulin aspart  0-24 Units Subcutaneous Q4H  . mouth rinse  15 mL Mouth Rinse BID  . metoprolol tartrate  12.5 mg Oral BID   Or  . metoprolol tartrate  12.5 mg Per Tube BID  . pantoprazole  40 mg Oral Daily  . simvastatin  40 mg Oral QPM  . sodium chloride flush  10-40 mL Intracatheter Q12H  . sodium chloride flush  3 mL Intravenous Q12H  . thiamine  50 mg Oral Daily  . zolpidem  5 mg Oral QHS   Continuous Infusions: . sodium chloride 20 mL/hr at 11/18/17 0647  . sodium chloride    . sodium chloride 20 mL/hr at 11/16/17 1345  . amiodarone 60 mg/hr (11/18/17 0700)   Followed by  . amiodarone    . dexmedetomidine (PRECEDEX) IV infusion Stopped (11/16/17 1658)  . lactated ringers    . lactated ringers 20 mL/hr at 11/18/17 0700  . lactated ringers Stopped (11/17/17 0913)  . nitroGLYCERIN Stopped (11/16/17 1350)  . phenylephrine (NEO-SYNEPHRINE) Adult infusion Stopped (11/17/17 0734)   PRN Meds: sodium chloride, lactated  ringers, metoprolol tartrate, midazolam, morphine injection, ondansetron (ZOFRAN) IV, oxyCODONE, sodium chloride flush, sodium chloride flush, traMADol   Vital Signs    Vitals:   11/18/17 0500 11/18/17 0600 11/18/17 0700 11/18/17 0800  BP: 120/67 98/71 102/69 96/71  Pulse: 83 87 80 69  Resp: 13 (!) 25 (!) 29 15  Temp:      TempSrc:      SpO2: 94% 94% 97% 93%  Weight: 93.4 kg     Height:        Intake/Output Summary (Last 24 hours) at 11/18/2017 0922 Last data filed at 11/18/2017 0700 Gross per 24 hour  Intake 1060.3 ml  Output 1515 ml  Net -454.7 ml    I/O since admission: +3529  Filed Weights   11/16/17 0517 11/17/17 0500 11/18/17 0500  Weight: 87.7 kg 97.9 kg 93.4 kg    Telemetry    AF at 100 - Personally Reviewed  ECG    Will repeat today  11/17/2017 ECG (independently read by me): NSR at 79, RBBB with mild diffuse J point elevation 1,aVL, V2-6 suggestive of pericardial etiology  Physical Exam   BP 96/71   Pulse  69   Temp 98.6 F (37 C) (Oral)   Resp 15   Ht 5\' 8"  (1.727 m)   Wt 93.4 kg   SpO2 93%   BMI 31.31 kg/m  General: Alert, oriented, no distress.  Skin: normal turgor, no rashes, warm and dry HEENT: Normocephalic, atraumatic. Pupils equal round and reactive to light; sclera anicteric; extraocular muscles intact; Fundi ** Nose without nasal septal hypertrophy Mouth/Parynx benign; Mallinpatti scale 3 Neck: No JVD, no carotid bruits; normal carotid upstroke Lungs: clear to ausculatation and percussion; no wheezing or rales Chest wall: without tenderness to palpitation Heart: PMI not displaced, irregular irregular at 100, s1 s2 normal, 1/6 systolic murmur, no diastolic murmur, no rubs, gallops, thrills, or heaves Abdomen: mild distention, nontender; no hepatosplenomehaly, BS+; abdominal aorta nontender and not dilated by palpation. Back: no CVA tenderness Pulses 2+ Musculoskeletal: full range of motion, normal strength, no joint  deformities Extremities: no clubbing cyanosis or edema, Homan's sign negative  Neurologic: grossly nonfocal; Cranial nerves grossly wnl Psychologic: Normal mood and affect   Labs    Chemistry Recent Labs  Lab 11/14/17 0841  11/15/17 2026 11/16/17 0409  11/17/17 0457 11/17/17 1613 11/17/17 1614 11/18/17 0500  NA 140   < > 137 137   < > 137  --  135 135  K 5.1  --  3.9 3.9   < > 4.4  --  4.7 4.4  CL 100  --  106 105   < > 107  --  101 102  CO2 24  --  22 23  --  22  --   --  23  GLUCOSE 95   < > 112* 112*   < > 123*  --  138* 122*  BUN 14   < > 12 11   < > 12  --  19 23  CREATININE 0.87  --  0.86 0.86   < > 0.78 1.31* 1.30* 1.23  CALCIUM 9.4  --  9.0 8.7*  --  8.1*  --   --  8.5*  PROT 6.7  --  6.9  --   --   --   --   --   --   ALBUMIN 4.4  --  3.9  --   --   --   --   --   --   AST 37  --  33  --   --   --   --   --   --   ALT 36  --  36  --   --   --   --   --   --   ALKPHOS 85  --  70  --   --   --   --   --   --   BILITOT 0.3  --  0.7  --   --   --   --   --   --   GFRNONAA 86  --  >60 >60   < > >60 52*  --  56*  GFRAA 99  --  >60 >60   < > >60 >60  --  >60  ANIONGAP  --    < > 9 9  --  8  --   --  10   < > = values in this interval not displayed.     Hematology Recent Labs  Lab 11/17/17 0457 11/17/17 1613 11/17/17 1614 11/18/17 0607  WBC 14.2* 14.1*  --  15.3*  RBC 3.37* 3.24*  --  3.20*  HGB 10.8* 10.7* 10.9* 10.3*  HCT 33.1* 32.6* 32.0* 32.6*  MCV 98.2 100.6*  --  101.9*  MCH 32.0 33.0  --  32.2  MCHC 32.6 32.8  --  31.6  RDW 12.7 13.1  --  13.1  PLT 209 201  --  190    Cardiac EnzymesNo results for input(s): TROPONINI in the last 168 hours. No results for input(s): TROPIPOC in the last 168 hours.   BNPNo results for input(s): BNP, PROBNP in the last 168 hours.   DDimer No results for input(s): DDIMER in the last 168 hours.   Lipid Panel     Component Value Date/Time   CHOL 170 11/14/2017 0841   TRIG 231 (H) 11/14/2017 0841   HDL 48 11/14/2017  0841   CHOLHDL 3.5 11/14/2017 0841   CHOLHDL 3.4 01/29/2007 0410   VLDL 17 01/29/2007 0410   LDLCALC 76 11/14/2017 0841    Radiology    Dg Chest Port 1 View  Result Date: 11/18/2017 CLINICAL DATA:  Postop check.  Post CABG. EXAM: PORTABLE CHEST 1 VIEW COMPARISON:  11/17/2017 FINDINGS: Right jugular central line is still present but the pulmonary artery catheter has been removed. Left chest tube is in stable position and question a tiny left apical pneumothorax but this area is difficult to characterize. There is haziness in the left lung and right lung base which has minimally changed. Haziness is suggestive for mild edema. Stable enlargement of the cardiac silhouette with post CABG changes. Surgical hardware in the lower cervical spine. Tracheal deviation towards the right is unchanged. IMPRESSION: 1. Mild edema in both lungs, left side greater than right. No significant change from the most recent comparison examination. 2. Stable position of the left chest tube and question a tiny left apical pneumothorax. 3. Removal of pulmonary artery catheter. Right jugular central line is still present. Electronically Signed   By: Markus Daft M.D.   On: 11/18/2017 08:43   Dg Chest Port 1 View  Result Date: 11/17/2017 CLINICAL DATA:  Post CABG EXAM: PORTABLE CHEST 1 VIEW COMPARISON:  Portable exam 0609 hours compared to 11/16/2017 FINDINGS: Interval removal of nasogastric and endotracheal tubes. Mediastinal drain and LEFT thoracostomy tube unchanged. RIGHT jugular Swan-Ganz catheter with tip projecting over main pulmonary artery near bifurcation. Enlargement of cardiac silhouette post CABG. Bibasilar atelectasis and minimal perihilar edema. No pleural effusion or pneumothorax. Bones demineralized with evidence of prior cervical spine fusion. IMPRESSION: Persistent bibasilar atelectasis and minimal perihilar edema. Electronically Signed   By: Lavonia Dana M.D.   On: 11/17/2017 10:15   Dg Chest Port 1  View  Result Date: 11/16/2017 CLINICAL DATA:  Post CABG EXAM: PORTABLE CHEST 1 VIEW COMPARISON:  11/16/2017 FINDINGS: Status post median sternotomy and CABG. Endotracheal tube is in place 5.7 centimeters above the carina. A RIGHT IJ Swan-Ganz catheter is in place, tip overlying the level of the pulmonary outflow tract. A LEFT-sided chest tube is in place. Heart size is mildly enlarged. There are perihilar changes of mild pulmonary edema. No pneumothorax. IMPRESSION: Postoperative changes.  Mild edema. Electronically Signed   By: Nolon Nations M.D.   On: 11/16/2017 14:49    Cardiac Studies   ------------------------------------------------------------------- 10/26/17/ECHO Study Conclusions  - Left ventricle: The cavity size was normal. There was mild   concentric hypertrophy. Systolic function was vigorous. The   estimated ejection fraction was in the range of 65% to 70%. Wall   motion was normal; there were no regional wall motion  abnormalities. Features are consistent with a pseudonormal left   ventricular filling pattern, with concomitant abnormal relaxation   and increased filling pressure (grade 2 diastolic dysfunction).   Doppler parameters are consistent with elevated ventricular   end-diastolic filling pressure. - Aortic valve: There was no regurgitation. - Mitral valve: There was mild regurgitation. - Left atrium: The atrium was mildly dilated. - Right ventricle: Systolic function was normal. - Right atrium: The atrium was normal in size. - Tricuspid valve: There was mild regurgitation. - Pulmonary arteries: Systolic pressure was within the normal   range. - Inferior vena cava: The vessel was normal in size. The   respirophasic diameter changes were in the normal range (= 50%),   consistent with normal central venous pressure. - Pericardium, extracardiac: There was no pericardial effusion.   11/15/2017 CATH  Prox RCA to Mid RCA lesion is 60% stenosed.  Mid RCA  lesion is 50% stenosed.  Prox Cx to Mid Cx lesion is 100% stenosed.  Ost 2nd Mrg lesion is 80% stenosed.  2nd Mrg lesion is 90% stenosed.  Ost 1st Mrg to 1st Mrg lesion is 50% stenosed.  Dist LM to Ost LAD lesion is 85% stenosed.  Prox LAD to Mid LAD lesion is 75% stenosed.   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  LVEDP 12 mm.  Previous echo documentation of hyperdynamic LV function with an EF of 65 to 70%.  Suspect high right radial artery take-off with stenosis/spasm above the elbow and retrograde filling of brachial artery  RECOMMENDATION: Patient will be admitted following his cardiac catheterization.  I have discussed the catheterization findings with Dr. Servando Snare who will proceed with CABG revascularization surgery tomorrow.        ECHO TEE (OR) Result status: Final result   Septum: No Patent Foramen Ovale present.  Left atrium: Patent foramen ovale not present.  Aortic valve: The valve is trileaflet. Mild valve thickening present. No stenosis. Trace regurgitation.  Tricuspid valve: Mild regurgitation. The tricuspid valve regurgitation jet is central.  Pulmonic valve: Trace regurgitation.  Mitral valve: Moderate leaflet thickening is present. Mild leaflet calcification is present. Mild mitral annular calcification. Mild regurgitation.  Aorta: The ascending aorta is dilated.  Right ventricle: Normal cavity size, wall thickness and ejection fraction.  Right ventricle: Normal cavity size and ejection fraction.       Patient Profile     72 y.o. 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  Assessment & Plan    1. Multivessel CAD: cath data as above. No prior chest pain but exertional dyspnea.  2. Day 2 s/p CABG x4  3. Post op AF RVR; now on iv amiodarone drip with improved rate at 100  4. Recent essential hypertension.  5. Grade 2 diastolic dysfunction   6. Hyperlipidemia:  Will dc simvastatin with significant CAD on therapy and change to rosuvastatin 40 mg.   7. Evaluate for OSA as outpatient with loud snoring and witnessed gasping as discussed in office visit.  Signed, Troy Sine, MD, [facility] 11/18/2017, 9:22 AM

## 2017-11-19 ENCOUNTER — Inpatient Hospital Stay (HOSPITAL_COMMUNITY): Payer: PPO

## 2017-11-19 LAB — GLUCOSE, CAPILLARY
GLUCOSE-CAPILLARY: 114 mg/dL — AB (ref 70–99)
GLUCOSE-CAPILLARY: 120 mg/dL — AB (ref 70–99)
GLUCOSE-CAPILLARY: 107 mg/dL — AB (ref 70–99)
Glucose-Capillary: 114 mg/dL — ABNORMAL HIGH (ref 70–99)

## 2017-11-19 LAB — CBC
HCT: 31.7 % — ABNORMAL LOW (ref 39.0–52.0)
Hemoglobin: 10.2 g/dL — ABNORMAL LOW (ref 13.0–17.0)
MCH: 32.5 pg (ref 26.0–34.0)
MCHC: 32.2 g/dL (ref 30.0–36.0)
MCV: 101 fL — ABNORMAL HIGH (ref 80.0–100.0)
Platelets: 193 10*3/uL (ref 150–400)
RBC: 3.14 MIL/uL — ABNORMAL LOW (ref 4.22–5.81)
RDW: 13.2 % (ref 11.5–15.5)
WBC: 13.1 10*3/uL — ABNORMAL HIGH (ref 4.0–10.5)
nRBC: 0 % (ref 0.0–0.2)

## 2017-11-19 LAB — BASIC METABOLIC PANEL
Anion gap: 12 (ref 5–15)
BUN: 23 mg/dL (ref 8–23)
CO2: 20 mmol/L — ABNORMAL LOW (ref 22–32)
Calcium: 8.4 mg/dL — ABNORMAL LOW (ref 8.9–10.3)
Chloride: 102 mmol/L (ref 98–111)
Creatinine, Ser: 1.03 mg/dL (ref 0.61–1.24)
GFR calc Af Amer: >60 mL/min (ref >60)
GFR calc non Af Amer: >60 mL/min (ref >60)
Glucose, Bld: 113 mg/dL — ABNORMAL HIGH (ref 70–99)
Potassium: 4 mmol/L (ref 3.5–5.1)
Sodium: 134 mmol/L — ABNORMAL LOW (ref 135–145)

## 2017-11-19 LAB — MAGNESIUM: Magnesium: 2.2 mg/dL (ref 1.7–2.4)

## 2017-11-19 MED ORDER — SODIUM CHLORIDE 0.9% FLUSH
3.0000 mL | INTRAVENOUS | Status: DC | PRN
  Administered 2017-11-20 – 2017-11-21 (×2): 3 mL via INTRAVENOUS
  Filled 2017-11-19 (×2): qty 3

## 2017-11-19 MED ORDER — MOVING RIGHT ALONG BOOK
Freq: Once | Status: AC
  Administered 2017-11-19: 1
  Filled 2017-11-19: qty 1

## 2017-11-19 MED ORDER — ONDANSETRON HCL 4 MG PO TABS: 4.0000 mg | ORAL_TABLET | Freq: Four times a day (QID) | ORAL | Status: DC | PRN

## 2017-11-19 MED ORDER — SODIUM CHLORIDE 0.9 % IV SOLN: 250.0000 mL | INTRAVENOUS | Status: DC | PRN

## 2017-11-19 MED ORDER — BISACODYL 10 MG RE SUPP: 10.0000 mg | Freq: Every day | RECTAL | Status: DC | PRN

## 2017-11-19 MED ORDER — AMIODARONE HCL 200 MG PO TABS: 200.0000 mg | ORAL_TABLET | Freq: Every day | ORAL | Status: DC

## 2017-11-19 MED ORDER — METOPROLOL TARTRATE 12.5 MG HALF TABLET
12.5000 mg | ORAL_TABLET | Freq: Two times a day (BID) | ORAL | Status: DC
  Administered 2017-11-19 – 2017-11-20 (×3): 12.5 mg via ORAL
  Filled 2017-11-19 (×3): qty 1

## 2017-11-19 MED ORDER — BISACODYL 5 MG PO TBEC
10.0000 mg | DELAYED_RELEASE_TABLET | Freq: Every day | ORAL | Status: DC | PRN
  Administered 2017-11-21: 10 mg via ORAL
  Filled 2017-11-19: qty 2

## 2017-11-19 MED ORDER — TRAMADOL HCL 50 MG PO TABS
50.0000 mg | ORAL_TABLET | ORAL | Status: DC | PRN
  Administered 2017-11-19 – 2017-11-20 (×2): 100 mg via ORAL
  Filled 2017-11-19 (×2): qty 2

## 2017-11-19 MED ORDER — AMIODARONE HCL 200 MG PO TABS
200.0000 mg | ORAL_TABLET | Freq: Two times a day (BID) | ORAL | Status: DC
  Administered 2017-11-19 – 2017-11-20 (×3): 200 mg via ORAL
  Filled 2017-11-19 (×3): qty 1

## 2017-11-19 MED ORDER — LACTULOSE 10 GM/15ML PO SOLN
20.0000 g | Freq: Every day | ORAL | Status: DC | PRN
  Administered 2017-11-19: 20 g via ORAL
  Filled 2017-11-19: qty 30

## 2017-11-19 MED ORDER — DOCUSATE SODIUM 100 MG PO CAPS
200.0000 mg | ORAL_CAPSULE | Freq: Every day | ORAL | Status: DC
  Administered 2017-11-20 – 2017-11-22 (×2): 200 mg via ORAL
  Filled 2017-11-19 (×2): qty 2

## 2017-11-19 MED ORDER — ACETAMINOPHEN 325 MG PO TABS: 650.0000 mg | ORAL_TABLET | Freq: Four times a day (QID) | ORAL | Status: DC | PRN

## 2017-11-19 MED ORDER — ASPIRIN EC 325 MG PO TBEC
325.0000 mg | DELAYED_RELEASE_TABLET | Freq: Every day | ORAL | Status: DC
  Administered 2017-11-20 – 2017-11-21 (×2): 325 mg via ORAL
  Filled 2017-11-19 (×2): qty 1

## 2017-11-19 MED ORDER — OXYCODONE HCL 5 MG PO TABS: 5.0000 mg | ORAL_TABLET | ORAL | Status: DC | PRN

## 2017-11-19 MED ORDER — INSULIN ASPART 100 UNIT/ML ~~LOC~~ SOLN: 0.0000 [IU] | Freq: Three times a day (TID) | SUBCUTANEOUS | Status: DC

## 2017-11-19 MED ORDER — SODIUM CHLORIDE 0.9% FLUSH
3.0000 mL | Freq: Two times a day (BID) | INTRAVENOUS | Status: DC
  Administered 2017-11-19 – 2017-11-20 (×3): 3 mL via INTRAVENOUS
  Administered 2017-11-21: 6 mL via INTRAVENOUS
  Administered 2017-11-21: 3 mL via INTRAVENOUS

## 2017-11-19 MED ORDER — PANTOPRAZOLE SODIUM 40 MG PO TBEC
40.0000 mg | DELAYED_RELEASE_TABLET | Freq: Every day | ORAL | Status: DC
  Administered 2017-11-20 – 2017-11-22 (×3): 40 mg via ORAL
  Filled 2017-11-19 (×3): qty 1

## 2017-11-19 MED ORDER — ALUM & MAG HYDROXIDE-SIMETH 200-200-20 MG/5ML PO SUSP: 15.0000 mL | ORAL | Status: DC | PRN

## 2017-11-19 MED ORDER — ONDANSETRON HCL 4 MG/2ML IJ SOLN: 4.0000 mg | Freq: Four times a day (QID) | INTRAMUSCULAR | Status: DC | PRN

## 2017-11-19 NOTE — Progress Notes (Signed)
Hillis Range, RN concerning continued need for PICC. Patient has two PIV, working well per night nurse, and is just getting amiodarone. He converted earlier this morning into NSR. No need for a PICC at this time. Assured nurse if need arose to reorder, but present order will be cancelled.

## 2017-11-19 NOTE — Progress Notes (Signed)
Pt arrived to 4e from 2h. Vitals obtained. Telemetry box applied and CCMD notified x2. Pt oriented to room and staff. Pt's epicardial pacing wires attached to external pacer 60 VVI. Incisions assessed. Pt's lunch tray ordered. Pt denies needs at this time. Will continue current plan of care.   Ara Kussmaul BSN, RN

## 2017-11-19 NOTE — Plan of Care (Signed)

## 2017-11-19 NOTE — Progress Notes (Signed)
Patient's telemetry monitor noted to have alarmed PAUSE and HR decreased to 34 for approx 8 seconds before nursing staff intervened and assessed patient. Patient sleeping at time of event. Patient easily stimulated awake and HR returned to 85-90s. Further assessment of PAUSE event reveals an initial 2.28 second pause with rhythm converting from Afib HR 110s to Sinus Loletha Grayer with PJC and HR 34. Patient reverted back to Afib with HR 85-90s. Patient asymptomatic, vitals remain stable. At time of event epicardial pacer wires not connected to pacemaker. For patient safety, epicardial wires reconnected to pacemaker and pacemaker turned on with settings of VVI: Rate 60, mA 10. Will continue to monitor.

## 2017-11-19 NOTE — Progress Notes (Signed)
Patient ID: Carl [name], male   DOB: Jan 09, 1946, 72 y.o.   MRN: [id number] T[facility] DAILY ICU PROGRESS NOTE                   [address]            [place] [address]          [phone]   3 Days Post-Op Procedure(s) (LRB): CORONARY ARTERY BYPASS GRAFTING (CABG) x4. LEFT ENDOSCOPIC SAPHENOUS VEIN HARVEST AND MAMMARY ARTERY TAKE DOWN. LIMA TO LAD, SVG TO PDA, SVG TO DISTAL CIRC & OMI. (N/A) TRANSESOPHAGEAL ECHOCARDIOGRAM (TEE) (N/A)  Total Length of Stay:  LOS: 4 days   Subjective: Patient awake alert, walked in unit this morning Now back in sinus rhythm rate 64  Objective: Vital signs in last 24 hours: Temp:  [97.8 F (36.6 C)-98.1 F (36.7 C)] 98.1 F (36.7 C) (10/13 0729) Pulse Rate:  [48-107] 69 (10/13 0700) Cardiac Rhythm: Atrial fibrillation (10/13 0800) Resp:  [11-35] 14 (10/13 0700) BP: (90-136)/(49-89) 126/72 (10/13 0700) SpO2:  [89 %-100 %] 99 % (10/13 0700) Weight:  [93.8 kg] 93.8 kg (10/13 0500)  Filed Weights   11/17/17 0500 11/18/17 0500 11/19/17 0500  Weight: 97.9 kg 93.4 kg 93.8 kg    Weight change: 0.4 kg   Hemodynamic parameters for last 24 hours:    Intake/Output from previous day: 10/12 0701 - 10/13 0700 In: 726.2 [P.O.:120; I.V.:606.2] Out: 1165 [Urine:1165]  Intake/Output this shift: Total I/O In: 33.3 [I.V.:33.3] Out: 100 [Urine:100]  Current Meds: Scheduled Meds: . acetaminophen  1,000 mg Oral Q6H   Or  . acetaminophen (TYLENOL) oral liquid 160 mg/5 mL  1,000 mg Per Tube Q6H  . acyclovir  400 mg Oral BID  . amiodarone  200 mg Oral Q12H  . [START ON 11/26/2017] amiodarone  200 mg Oral Daily  . aspirin EC  325 mg Oral Daily   Or  . aspirin  324 mg Per Tube Daily  . bisacodyl  10 mg Oral Daily   Or  . bisacodyl  10 mg Rectal Daily  . Chlorhexidine Gluconate Cloth  6 each Topical Daily  . citalopram  20 mg Oral Daily  . docusate sodium  200 mg Oral Daily  . enoxaparin (LOVENOX) injection  30 mg Subcutaneous QHS  .  folic acid  1 mg Oral Daily  . guaiFENesin  600 mg Oral BID  . insulin aspart  0-24 Units Subcutaneous Q4H  . insulin aspart  0-24 Units Subcutaneous Q4H  . mouth rinse  15 mL Mouth Rinse BID  . metoprolol tartrate  12.5 mg Oral BID   Or  . metoprolol tartrate  12.5 mg Per Tube BID  . pantoprazole  40 mg Oral Daily  . rosuvastatin  40 mg Oral q1800  . sodium chloride flush  10-40 mL Intracatheter Q12H  . sodium chloride flush  3 mL Intravenous Q12H  . thiamine  50 mg Oral Daily  . zolpidem  5 mg Oral QHS   Continuous Infusions: . sodium chloride 20 mL/hr at 11/18/17 0647  . sodium chloride    . sodium chloride 20 mL/hr at 11/16/17 1345  . dexmedetomidine (PRECEDEX) IV infusion Stopped (11/16/17 1658)  . lactated ringers    . lactated ringers Stopped (11/18/17 1439)  . lactated ringers Stopped (11/17/17 0913)  . nitroGLYCERIN Stopped (11/16/17 1350)  . phenylephrine (NEO-SYNEPHRINE) Adult infusion Stopped (11/17/17 0734)   PRN Meds:.sodium chloride, lactated ringers, metoprolol tartrate, midazolam, morphine injection,  ondansetron (ZOFRAN) IV, oxyCODONE, sodium chloride flush, sodium chloride flush, traMADol  General appearance: alert and cooperative Neurologic: intact Heart: regular rate and rhythm, S1, S2 normal, no murmur, click, rub or gallop Lungs: diminished breath sounds bibasilar Abdomen: soft, non-tender; bowel sounds normal; no masses,  no organomegaly Extremities: extremities normal, atraumatic, no cyanosis or edema and Homans sign is negative, no sign of DVT Wound: Sternum stable  Lab Results: CBC: Recent Labs    11/18/17 0607 11/19/17 0217  WBC 15.3* 13.1*  HGB 10.3* 10.2*  HCT 32.6* 31.7*  PLT 190 193   BMET:  Recent Labs    11/18/17 0500 11/19/17 0217  NA 135 134*  K 4.4 4.0  CL 102 102  CO2 23 20*  GLUCOSE 122* 113*  BUN 23 23  CREATININE 1.23 1.03  CALCIUM 8.5* 8.4*    CMET: Lab Results  Component Value Date   WBC 13.1 (H) 11/19/2017    HGB 10.2 (L) 11/19/2017   HCT 31.7 (L) 11/19/2017   PLT 193 11/19/2017   GLUCOSE 113 (H) 11/19/2017   CHOL 170 11/14/2017   TRIG 231 (H) 11/14/2017   HDL 48 11/14/2017   LDLCALC 76 11/14/2017   ALT 19 11/18/2017   AST 29 11/18/2017   NA 134 (L) 11/19/2017   K 4.0 11/19/2017   CL 102 11/19/2017   CREATININE 1.03 11/19/2017   BUN 23 11/19/2017   CO2 20 (L) 11/19/2017   TSH 7.798 (H) 11/18/2017   INR 1.35 11/16/2017   HGBA1C 5.4 11/15/2017      PT/INR:  Recent Labs    11/16/17 1354  LABPROT 16.6*  INR 1.35   Radiology: Dg Chest Port 1 View  Result Date: 11/19/2017 CLINICAL DATA:  Status post chest tube removal EXAM: PORTABLE CHEST 1 VIEW COMPARISON:  11/18/2017 FINDINGS: Cardiac shadow is enlarged. Postsurgical changes are again seen. Left-sided thoracostomy catheter has been removed in the interval. No pneumothorax is identified. Some patchy left basilar atelectasis is seen. The right lung is hypoinflated but clear. No acute bony abnormality is noted. IMPRESSION: No recurrent pneumothorax following chest tube removal. Electronically Signed   By: Inez Catalina M.D.   On: 11/19/2017 08:23   [place] Ekg Site Rite  Result Date: 11/18/2017 If Site Rite image not attached, placement could not be confirmed due to current cardiac rhythm.    Assessment/Plan: S/P Procedure(s) (LRB): CORONARY ARTERY BYPASS GRAFTING (CABG) x4. LEFT ENDOSCOPIC SAPHENOUS VEIN HARVEST AND MAMMARY ARTERY TAKE DOWN. LIMA TO LAD, SVG TO PDA, SVG TO DISTAL CIRC & OMI. (N/A) TRANSESOPHAGEAL ECHOCARDIOGRAM (TEE) (N/A) Mobilize Diuresis Plan for transfer to step-down: see transfer orders Complain of constipation-we will add lactulose TSH noted to be mildly elevated   [name] 11/19/2017 9:20 AM

## 2017-11-19 NOTE — Progress Notes (Signed)
Patients telemetry monitoring noted to go from Afib to consistent V-paced. V-paced rhythm shortly coverts to sinus rhythm HR 60s. Patient maintaining sinus rhythm with HR 60s at this time. BP: 120/69, MAP 85, Oxygen saturations: 98-100% with 4L O2 via nasal cannula, and respiratory rate 14. Patient informed of conversion back to regular heart rhythm. Patient asleep at time of event, no reports of dizziness or shortness of breath. Will continue to monitor.

## 2017-11-20 ENCOUNTER — Inpatient Hospital Stay (HOSPITAL_COMMUNITY): Payer: PPO

## 2017-11-20 LAB — BASIC METABOLIC PANEL
Anion gap: 9 (ref 5–15)
BUN: 15 mg/dL (ref 8–23)
CO2: 24 mmol/L (ref 22–32)
Calcium: 8.5 mg/dL — ABNORMAL LOW (ref 8.9–10.3)
Chloride: 101 mmol/L (ref 98–111)
Creatinine, Ser: 0.84 mg/dL (ref 0.61–1.24)
GFR calc Af Amer: >60 mL/min (ref >60)
GFR calc non Af Amer: >60 mL/min (ref >60)
Glucose, Bld: 99 mg/dL (ref 70–99)
Potassium: 3.8 mmol/L (ref 3.5–5.1)
Sodium: 134 mmol/L — ABNORMAL LOW (ref 135–145)

## 2017-11-20 LAB — GLUCOSE, CAPILLARY
GLUCOSE-CAPILLARY: 120 mg/dL — AB (ref 70–99)
GLUCOSE-CAPILLARY: 101 mg/dL — AB (ref 70–99)
GLUCOSE-CAPILLARY: 105 mg/dL — AB (ref 70–99)
GLUCOSE-CAPILLARY: 103 mg/dL — AB (ref 70–99)
Glucose-Capillary: 97 mg/dL (ref 70–99)

## 2017-11-20 LAB — CBC
HCT: 31.2 % — ABNORMAL LOW (ref 39.0–52.0)
Hemoglobin: 10.1 g/dL — ABNORMAL LOW (ref 13.0–17.0)
MCH: 32.7 pg (ref 26.0–34.0)
MCHC: 32.4 g/dL (ref 30.0–36.0)
MCV: 101 fL — ABNORMAL HIGH (ref 80.0–100.0)
Platelets: 240 10*3/uL (ref 150–400)
RBC: 3.09 MIL/uL — ABNORMAL LOW (ref 4.22–5.81)
RDW: 13 % (ref 11.5–15.5)
WBC: 12.8 10*3/uL — ABNORMAL HIGH (ref 4.0–10.5)
nRBC: 0 % (ref 0.0–0.2)

## 2017-11-20 MED ORDER — AMIODARONE HCL 200 MG PO TABS
400.0000 mg | ORAL_TABLET | Freq: Two times a day (BID) | ORAL | Status: DC
  Administered 2017-11-20 – 2017-11-22 (×4): 400 mg via ORAL
  Filled 2017-11-20 (×4): qty 2

## 2017-11-20 MED ORDER — CIPROFLOXACIN HCL 500 MG PO TABS
500.0000 mg | ORAL_TABLET | Freq: Two times a day (BID) | ORAL | Status: DC
  Administered 2017-11-20 – 2017-11-22 (×4): 500 mg via ORAL
  Filled 2017-11-20 (×4): qty 1

## 2017-11-20 MED ORDER — ZOLPIDEM TARTRATE 5 MG PO TABS
10.0000 mg | ORAL_TABLET | Freq: Every day | ORAL | Status: DC
  Administered 2017-11-20 – 2017-11-21 (×2): 10 mg via ORAL
  Filled 2017-11-20 (×2): qty 2

## 2017-11-20 MED ORDER — FUROSEMIDE 40 MG PO TABS
40.0000 mg | ORAL_TABLET | Freq: Every day | ORAL | Status: DC
  Administered 2017-11-20 – 2017-11-22 (×3): 40 mg via ORAL
  Filled 2017-11-20 (×3): qty 1

## 2017-11-20 MED ORDER — POTASSIUM CHLORIDE CRYS ER 20 MEQ PO TBCR
20.0000 meq | EXTENDED_RELEASE_TABLET | Freq: Every day | ORAL | Status: DC
  Administered 2017-11-20 – 2017-11-22 (×3): 20 meq via ORAL
  Filled 2017-11-20 (×3): qty 1

## 2017-11-20 NOTE — Progress Notes (Signed)
Patient education completed for removal of pacer wires, patient verbalized understanding, pacer wires removed as ordered without difficulty, patient now on bedrest call bell within reach, will continue to monitor.

## 2017-11-20 NOTE — Progress Notes (Addendum)
      [address]       [place] [address]             [phone]      4 Days Post-Op Procedure(s) (LRB): CORONARY ARTERY BYPASS GRAFTING (CABG) x4. LEFT ENDOSCOPIC SAPHENOUS VEIN HARVEST AND MAMMARY ARTERY TAKE DOWN. LIMA TO LAD, SVG TO PDA, SVG TO DISTAL CIRC & OMI. (N/A) TRANSESOPHAGEAL ECHOCARDIOGRAM (TEE) (N/A) Subjective: Feels like he is making good progress. Hungry for breakfast.   Objective: Vital signs in last 24 hours: Temp:  [97.4 F (36.3 C)-98.2 F (36.8 C)] 97.4 F (36.3 C) (10/14 0453) Pulse Rate:  [66-101] 71 (10/14 0453) Cardiac Rhythm: Atrial fibrillation;Bundle branch block (10/14 0341) Resp:  [12-29] 24 (10/14 0453) BP: (98-136)/(64-81) 109/67 (10/14 0453) SpO2:  [96 %-100 %] 96 % (10/14 0453) Weight:  [92.9 kg] 92.9 kg (10/14 0453)     Intake/Output from previous day: 10/13 0701 - 10/14 0700 In: 402 [P.O.:342; I.V.:60] Out: 100 [Urine:100] Intake/Output this shift: No intake/output data recorded.  General appearance: alert, cooperative and no distress Heart: regular rate and rhythm, S1, S2 normal, no murmur, click, rub or gallop Lungs: clear to auscultation bilaterally Abdomen: soft, non-tender; bowel sounds normal; no masses,  no organomegaly Extremities: extremities normal, atraumatic, no cyanosis or edema Wound: clean and dry  Lab Results: Recent Labs    11/19/17 0217 11/20/17 0241  WBC 13.1* 12.8*  HGB 10.2* 10.1*  HCT 31.7* 31.2*  PLT 193 240   BMET:  Recent Labs    11/19/17 0217 11/20/17 0241  NA 134* 134*  K 4.0 3.8  CL 102 101  CO2 20* 24  GLUCOSE 113* 99  BUN 23 15  CREATININE 1.03 0.84  CALCIUM 8.4* 8.5*    PT/INR: No results for input(s): LABPROT, INR in the last 72 hours. ABG    Component Value Date/Time   PHART 7.334 (L) 11/16/2017 1831   HCO3 20.8 11/16/2017 1831   TCO2 23 11/17/2017 1614   ACIDBASEDEF 5.0 (H) 11/16/2017 1831   O2SAT 98.0 11/16/2017 1831   CBG (last 3)  Recent Labs   11/19/17 1623 11/19/17 2130 11/20/17 0603  GLUCAP 114* 107* 97    Assessment/Plan: S/P Procedure(s) (LRB): CORONARY ARTERY BYPASS GRAFTING (CABG) x4. LEFT ENDOSCOPIC SAPHENOUS VEIN HARVEST AND MAMMARY ARTERY TAKE DOWN. LIMA TO LAD, SVG TO PDA, SVG TO DISTAL CIRC & OMI. (N/A) TRANSESOPHAGEAL ECHOCARDIOGRAM (TEE) (N/A)  1. CV-rate-controlled atrial fibrillation around 20:00 last night now NSR. On Amio 200mg  BID. Continue ASA, statin. BP has been stable, continue Metoprolol 12.5mg  BID.  2. Pulm-tolerating 2L [place] with good oxygenation.  3. Renal-creatinine 0.84, weight has been trending down, however remains about 4kg over baseline weight.  4. H and H 10.1/32.7, expected acute blood loss anemia 5. Endo-blood glucose level is well controlled 6. TSH mildly elevated  Plan: Continue to wean oxygen as tolerated. Ambulate in the halls. Weight is trending down but remains fluid overloaded. Will add lasix 40mg  PO daily. Discontinue wires.   LOS: 5 days    Elgie Collard 11/20/2017    intermittent afib today  Wires out today Likely start eliquis tomorrow if still afib  I have seen and examined Denice Bors and agree with the above assessment  and plan.  [name] MD [facility] [phone] Office [phone] 11/20/2017 6:40 PM

## 2017-11-20 NOTE — Progress Notes (Signed)
CARDIAC REHAB PHASE I   PRE:  Rate/Rhythm: 80 SR  BP:  Sitting: 135/74        SaO2: 96 RA  MODE:  Ambulation: 160 ft   92 pk HR   POST:  Rate/Rhythm: 81 SR  BP:  Sitting: 140/65        SaO2: 98 RA  Patient ambulated 160 ft stand by assist with slow and steady gait. Patient denies pain, but had some shortness of breath upon return to room. Patient demonstrated 1000 on IS. Encouraged to continue use. Patient in recliner with call bell within reach. Will continue to follow.   [place], MS 11/20/2017 9:47 AM

## 2017-11-20 NOTE — Progress Notes (Signed)
Progress Note  Patient Name: Carl [name] Date of Encounter: 11/20/2017  Primary Cardiologist: Shelva Majestic, MD    Subjective   72 year old gentleman with a history of hyperlipidemia and recent onset of shortness of breath.  Heart catheterization found significant disease in the mid LAD first diagonal, left circumflex artery, and right coronary artery.  He status post coronary artery bypass grafting.  He is now postop day 4 and seems to be doing quite well. He had some episodes of atrial fibrillation.  He was not normal sinus rhythm this morning but this afternoon he is back in atrial fibrillation.  Had a lot of visitors today.  He is not slept well.  He usually takes Ambien 10 mg a day.  The dose here in the hospital is 5 mg a day.  I will increase the dose up to his normal dose of Ambien 10 mg a day.  Inpatient Medications    Scheduled Meds: . acyclovir  400 mg Oral BID  . amiodarone  200 mg Oral Q12H  . [START ON 11/26/2017] amiodarone  200 mg Oral Daily  . aspirin EC  325 mg Oral Daily  . Chlorhexidine Gluconate Cloth  6 each Topical Daily  . citalopram  20 mg Oral Daily  . docusate sodium  200 mg Oral Daily  . enoxaparin (LOVENOX) injection  30 mg Subcutaneous QHS  . folic acid  1 mg Oral Daily  . furosemide  40 mg Oral Daily  . guaiFENesin  600 mg Oral BID  . insulin aspart  0-24 Units Subcutaneous TID AC & HS  . mouth rinse  15 mL Mouth Rinse BID  . metoprolol tartrate  12.5 mg Oral BID  . pantoprazole  40 mg Oral QAC breakfast  . potassium chloride  20 mEq Oral Daily  . rosuvastatin  40 mg Oral q1800  . sodium chloride flush  10-40 mL Intracatheter Q12H  . sodium chloride flush  3 mL Intravenous Q12H  . thiamine  50 mg Oral Daily  . zolpidem  5 mg Oral QHS   Continuous Infusions: . sodium chloride     PRN Meds: sodium chloride, acetaminophen, alum & mag hydroxide-simeth, bisacodyl **OR** bisacodyl, lactulose, ondansetron **OR** ondansetron (ZOFRAN) IV, oxyCODONE,  sodium chloride flush, sodium chloride flush, traMADol   Vital Signs    Vitals:   11/20/17 0453 11/20/17 0800 11/20/17 1115 11/20/17 1130  BP: 109/67 115/67 (!) 157/79 137/72  Pulse: 71 75  71  Resp: (!) 24 (!) 25 (!) 24 (!) 24  Temp: (!) 97.4 F (36.3 C) 97.8 F (36.6 C)    TempSrc: Oral Oral    SpO2: 96% 97%  90%  Weight: 92.9 kg     Height:        Intake/Output Summary (Last 24 hours) at 11/20/2017 1404 Last data filed at 11/20/2017 0900 Gross per 24 hour  Intake 240 ml  Output -  Net 240 ml   Filed Weights   11/18/17 0500 11/19/17 0500 11/20/17 0453  Weight: 93.4 kg 93.8 kg 92.9 kg    Telemetry    Atrial fibrillation with a mildly elevated heart rate.- Personally Reviewed  ECG    - Personally Reviewed  Physical Exam   GEN:  Relates gentleman, no acute distress Neck: No JVD Cardiac:  Irregularly irregular.  Respiratory: Clear to auscultation bilaterally. GI: Soft, nontender, non-distended  MS: No edema; No deformity. Neuro:  Nonfocal  Psych: Normal affect   Labs    Chemistry Recent Labs  Lab 11/14/17 0841 11/15/17 2026  11/18/17 0500 11/18/17 0607 11/19/17 0217 11/20/17 0241  NA 140 137   < > 135  --  134* 134*  K 5.1 3.9   < > 4.4  --  4.0 3.8  CL 100 106   < > 102  --  102 101  CO2 24 22   < > 23  --  20* 24  GLUCOSE 95 112*   < > 122*  --  113* 99  BUN 14 12   < > 23  --  23 15  CREATININE 0.87 0.86   < > 1.23  --  1.03 0.84  CALCIUM 9.4 9.0   < > 8.5*  --  8.4* 8.5*  PROT 6.7 6.9  --   --  5.7*  --   --   ALBUMIN 4.4 3.9  --   --  3.4*  --   --   AST 37 33  --   --  29  --   --   ALT 36 36  --   --  19  --   --   ALKPHOS 85 70  --   --  44  --   --   BILITOT 0.3 0.7  --   --  0.8  --   --   GFRNONAA 86 >60   < > 56*  --  >60 >60  GFRAA 99 >60   < > >60  --  >60 >60  ANIONGAP  --  9   < > 10  --  12 9   < > = values in this interval not displayed.     Hematology Recent Labs  Lab 11/18/17 0607 11/19/17 0217 11/20/17 0241  WBC  15.3* 13.1* 12.8*  RBC 3.20* 3.14* 3.09*  HGB 10.3* 10.2* 10.1*  HCT 32.6* 31.7* 31.2*  MCV 101.9* 101.0* 101.0*  MCH 32.2 32.5 32.7  MCHC 31.6 32.2 32.4  RDW 13.1 13.2 13.0  PLT 190 193 240    Cardiac EnzymesNo results for input(s): TROPONINI in the last 168 hours. No results for input(s): TROPIPOC in the last 168 hours.   BNPNo results for input(s): BNP, PROBNP in the last 168 hours.   DDimer No results for input(s): DDIMER in the last 168 hours.   Radiology    Dg Chest 2 View  Result Date: 11/20/2017 CLINICAL DATA:  Postoperative examination EXAM: CHEST - 2 VIEW COMPARISON:  11/19/2017; 11/18/2017; 11/16/2017; chest CT-11/09/2017; 05/16/2014 FINDINGS: Grossly unchanged borderline enlarged cardiac silhouette and mediastinal contours post stress at grossly unchanged enlarged cardiac silhouette and mediastinal contours post median sternotomy and CABG. There is persistent thickening the right paratracheal stripe presumably secondary prominent vasculature. Overall improved aeration of the lungs with persistent trace pleural effusions and associated bibasilar opacities. No new focal airspace opacities. No evidence of edema. No acute osseus abnormalities. Old left-sided rib fractures. Post ACDF of the lower cervical spine. Stigmata of DISH within the lower thoracic spine. Degenerative change within the superior aspect of the lumbar spine. IMPRESSION: 1. Overall improved aeration of the lungs suggests resolving edema and atelectasis. 2. Similar findings of trace bilateral effusions and associated bibasilar opacities, likely atelectasis. Electronically Signed   By: Sandi Mariscal M.D.   On: 11/20/2017 07:17   Dg Chest Port 1 View  Result Date: 11/19/2017 CLINICAL DATA:  Status post chest tube removal EXAM: PORTABLE CHEST 1 VIEW COMPARISON:  11/18/2017 FINDINGS: Cardiac shadow is enlarged. Postsurgical changes are again seen. Left-sided thoracostomy  catheter has been removed in the interval. No  pneumothorax is identified. Some patchy left basilar atelectasis is seen. The right lung is hypoinflated but clear. No acute bony abnormality is noted. IMPRESSION: No recurrent pneumothorax following chest tube removal. Electronically Signed   By: Inez Catalina M.D.   On: 11/19/2017 08:23    Cardiac Studies    Patient Profile     72 y.o. male with coronary artery disease and a recent coronary artery bypass grafting.  Assessment & Plan    1.  Coronary artery disease: The patient status post coronary artery bypass grafting. He is not having any pain.  2.  Postoperative atrial fibrillation: The patient is back in atrial for ablation.  He is on amiodarone.  We will increase the amiodarone back up to 40 mg twice a day for the next several days. Long-term he will need to be started on Eliquis.  I will defer to the surgical team as to the timing of initiating Eliquis.        For questions or updates, please contact [place] Please consult [email or web address] for contact info under        Signed, Mertie Moores, MD  11/20/2017, 2:04 PM

## 2017-11-20 NOTE — Progress Notes (Signed)
Patient ambulated three times in the hall on this shift on room air, with a stand by assist, ambulation well tolerated will monitor.

## 2017-11-20 NOTE — Progress Notes (Signed)
Patient back in afib, BP 124/79 HR 94,O2 92% on room air patient asymptomatic, resting quietly in bed with eyes closed, Tessa PA notified, will continue to monitor.                                                , will continue to monitor.

## 2017-11-21 DIAGNOSIS — R7989 Other specified abnormal findings of blood chemistry: Secondary | ICD-10-CM

## 2017-11-21 DIAGNOSIS — R0609 Other forms of dyspnea: Secondary | ICD-10-CM | POA: Diagnosis not present

## 2017-11-21 DIAGNOSIS — I4510 Unspecified right bundle-branch block: Secondary | ICD-10-CM | POA: Diagnosis not present

## 2017-11-21 MED ORDER — APIXABAN 5 MG PO TABS
5.0000 mg | ORAL_TABLET | Freq: Two times a day (BID) | ORAL | Status: DC
  Administered 2017-11-21 – 2017-11-22 (×3): 5 mg via ORAL
  Filled 2017-11-21 (×3): qty 1

## 2017-11-21 MED ORDER — METOPROLOL TARTRATE 25 MG PO TABS
25.0000 mg | ORAL_TABLET | Freq: Two times a day (BID) | ORAL | Status: DC
  Administered 2017-11-21 – 2017-11-22 (×3): 25 mg via ORAL
  Filled 2017-11-21 (×3): qty 1

## 2017-11-21 MED ORDER — ASPIRIN EC 81 MG PO TBEC
81.0000 mg | DELAYED_RELEASE_TABLET | Freq: Every day | ORAL | Status: DC
  Administered 2017-11-22: 81 mg via ORAL
  Filled 2017-11-21: qty 1

## 2017-11-21 NOTE — Progress Notes (Signed)
Patient ambulated in the hall twice on this shift, he refused his third walk and stated that he would walk later in the night, will continue to monitor.

## 2017-11-21 NOTE — Op Note (Signed)
NAMEARIHAAN, BELLUCCI MEDICAL RECORD [id number] ACCOUNT [id number] DATE OF BIRTH:Oct 26, 1945 FACILITY: [facility] LOCATION: [id number] PHYSICIAN:Bonney Berres Maryruth Bun, MD  OPERATIVE REPORT  DATE OF PROCEDURE:  11/20/2017  PREOPERATIVE DIAGNOSIS:  Coronary occlusive disease with unstable angina.  POSTOPERATIVE DIAGNOSIS:  Coronary occlusive disease with unstable angina.  SURGICAL PROCEDURES:  Coronary artery bypass grafting x4 with left internal mammary to the left anterior descending coronary artery, reverse saphenous vein graft to the first obtuse marginal and distal circumflex, reverse saphenous vein graft to the  posterior descending coronary artery with left thigh and calf endoscopic vein harvest.  SURGEON:  Lanelle Bal, MD  FIRST ASSISTANT:  Nicholes Rough, PA.  BRIEF HISTORY:  The patient is a 72 year old male with no known previous history of coronary artery disease, who had noted increasing symptoms of shortness of breath with exertion.  Because of symptoms and positive cardiac CT, a coronary angiogram was  performed by [name].  This demonstrated a high-grade proximal LAD disease and complex circumflex disease involving the first obtuse marginal of 80%, 60% of the second obtuse marginal and distal circumflex was totally occluded.  The patient also had  60% stenosis of the proximal and mid right coronary artery.  Overall, ventricular function was preserved.  Because of the patient's critical anatomy and increasing symptoms, coronary artery bypass grafting was recommended to the patient who agreed and  signed informed consent.  DESCRIPTION OF PROCEDURE:  With Swan-Ganz and arterial line monitors in place, the patient underwent general endotracheal anesthesia without incident.  Skin the chest and legs was prepped with Betadine, draped in the usual sterile manner.  Appropriate  timeout was performed and we proceeded first with endoscopic vein harvesting.  A small incision was made at the right  knee.  The vein was identified and for a short segment, this was followed endoscopically; however, the vein appeared abnormal on  appearance and it was not very compressible.  It appeared that this was probably not suitable vein and we moved to the left leg where an incision was made.  This vein was of much better quality and was endoscopically harvested from the thigh and calf.   The vein was of good quality and caliber.  Median sternotomy was performed.  The left internal mammary artery was dissected down as a pedicle graft.  The distal artery was divided and had excellent free flow.  The pericardium was opened.  Overall,  ventricular function appeared preserved.  The patient was systemically heparinized.  The ascending aorta was cannulated.  The right atrium was cannulated.  An aortic root vent cardioplegia needle was introduced into the ascending aorta.  The patient was  placed on cardiopulmonary bypass 2.4 liters per minute per meter square.  Sites and anastomoses were selected and dissected at the epicardium.  The patient's body temperature was cooled to 32 degrees.  Aortic crossclamp was applied and 500 mL cold blood  potassium cardioplegia was administered with diastolic arrest of the heart.  Myocardial septal temperatures monitored throughout the crossclamp.    We turned our attention first to the distal right coronary artery.  The posterior descending coronary artery was opened and admitted a 1.5 mm probe.  Using a running 7-0 Prolene, a distal anastomosis was performed.  The heart was then elevated.   Initially we had planned on bypassing the first obtuse marginal, second obtuse marginal, and distal circumflex.  On examination of the vessels, however, the first obtuse marginal and the distal circumflex were reasonable size vessels.  The  second obtuse  marginal was much smaller than anticipated.  The first OM was opened and admitted 1.5 mm probe.  Using a running 7-0 Prolene, a distal anastomosis  was performed side-to-side.  The distal extent of the same vein was then carried to the distal circumflex  vessel, which was opened and also admitted a 1.5 mm probe.  Using a running 7-0 Prolene, distal anastomosis was performed.  Additional cold blood cardioplegia was administered down the vein grafts.  Attention was then turned to the left anterior  descending coronary artery where in the midportion of the vessel was opened and admitted 1.5 mm probe.  The left internal mammary artery was anastomosed to left anterior descending coronary artery.  With cross clamp still in place, 2 punch aortotomies  were performed and each of the 2 vein grafts were anastomosed to the ascending aorta.  The bulldog on the mammary artery was removed with prompt rise in myocardial septal temperature.  The heart was allowed to passively fill and deair.  The proximal  anastomoses were completed and aortic crossclamp was removed with total crossclamp time of 83 minutes.  The patient required electrical defibrillation turned to a sinus rhythm.  Sites and anastomoses were inspected and free of bleeding.  He was then  ventilated and weaned from cardiopulmonary bypass without difficulty.  He remained hemodynamically stable.  He was decannulated in the usual fashion.  Protamine sulfate was administered with operative field hemostatic.  Atrial and ventricular pacing  wires were applied.  Graft markers were applied.  A left pleural tube and Blake mediastinal drain were left in place.  The pericardium was loosely reapproximated.  Sternum was closed with #6 stainless steel wire.  Fascia was closed with interrupted 0  Vicryl, running 3-0 Vicryl in subcutaneous tissue, 4-0 subcuticular stitch in skin edges.  Dry dressings were applied.  Sponge and needle count was reported as correct at completion of the procedure.  Electronic sponge detect system was used and no  sponges were retained.    Total pump time was 125 minutes.    The patient  did not require any blood bank blood products during the operative procedure.  AN/NUANCE  D:11/20/2017 T:11/21/2017 JOB:[id number]

## 2017-11-21 NOTE — Progress Notes (Signed)
Progress Note  Patient Name: Carl [name] Date of Encounter: 11/21/2017  Primary Cardiologist: Shelva Majestic, MD    Subjective   72 year old gentleman with a history of hyperlipidemia and recent onset of shortness of breath.  Heart catheterization found significant disease in the mid LAD first diagonal, left circumflex artery, and right coronary artery.  He status post coronary artery bypass grafting.  Has had intermittent atrial fibrillation.  Is back in normal sinus rhythm today.  He was in A. fib when I saw him yesterday.  He has been started on Eliquis to reduce the risk of stroke with his paroxysmal atrial fibrillation. His Ambien last night to 10 mg which is what he has taken for the past 30 years.  Inpatient Medications    Scheduled Meds: . acyclovir  400 mg Oral BID  . [START ON 11/26/2017] amiodarone  200 mg Oral Daily  . amiodarone  400 mg Oral Q12H  . apixaban  5 mg Oral BID  . [START ON 11/22/2017] aspirin EC  81 mg Oral Daily  . Chlorhexidine Gluconate Cloth  6 each Topical Daily  . ciprofloxacin  500 mg Oral BID  . citalopram  20 mg Oral Daily  . docusate sodium  200 mg Oral Daily  . folic acid  1 mg Oral Daily  . furosemide  40 mg Oral Daily  . guaiFENesin  600 mg Oral BID  . insulin aspart  0-24 Units Subcutaneous TID AC & HS  . mouth rinse  15 mL Mouth Rinse BID  . metoprolol tartrate  25 mg Oral BID  . pantoprazole  40 mg Oral QAC breakfast  . potassium chloride  20 mEq Oral Daily  . rosuvastatin  40 mg Oral q1800  . sodium chloride flush  10-40 mL Intracatheter Q12H  . sodium chloride flush  3 mL Intravenous Q12H  . thiamine  50 mg Oral Daily  . zolpidem  10 mg Oral QHS   Continuous Infusions: . sodium chloride     PRN Meds: sodium chloride, acetaminophen, alum & mag hydroxide-simeth, bisacodyl **OR** bisacodyl, lactulose, ondansetron **OR** ondansetron (ZOFRAN) IV, oxyCODONE, sodium chloride flush, sodium chloride flush, traMADol   Vital Signs      Vitals:   11/21/17 0358 11/21/17 0400 11/21/17 0756 11/21/17 1050  BP: 130/79  (!) 143/77 136/62  Pulse: 71 71 (!) 47   Resp: 13 13 19  (!) 22  Temp: 98.7 F (37.1 C)  97.6 F (36.4 C)   TempSrc: Oral  Axillary   SpO2: 99% 93% 94%   Weight: 92.6 kg     Height:        Intake/Output Summary (Last 24 hours) at 11/21/2017 1339 Last data filed at 11/20/2017 1847 Gross per 24 hour  Intake 240 ml  Output -  Net 240 ml   Filed Weights   11/19/17 0500 11/20/17 0453 11/21/17 0358  Weight: 93.8 kg 92.9 kg 92.6 kg    Telemetry    Atrial fibrillation with a mildly elevated heart rate.- Personally Reviewed  ECG    - Personally Reviewed  Physical Exam   Physical Exam: Blood pressure 136/62, pulse (!) 47, temperature 97.6 F (36.4 C), temperature source Axillary, resp. rate (!) 22, height 5\' 8"  (1.727 m), weight 92.6 kg, SpO2 94 %.  GEN:  Well nourished, well developed in no acute distress HEENT: Normal NECK: No JVD; No carotid bruits LYMPHATICS: No lymphadenopathy CARDIAC: Regular rate S1-S2.  His sternotomy is healing.  RESPIRATORY:  Clear to auscultation without  rales, wheezing or rhonchi  ABDOMEN: Soft, non-tender, non-distended MUSCULOSKELETAL:  No edema; No deformity  SKIN: Warm and dry NEUROLOGIC:  Alert and oriented x 3   Labs    Chemistry Recent Labs  Lab 11/15/17 2026  11/18/17 0500 11/18/17 0607 11/19/17 0217 11/20/17 0241  NA 137   < > 135  --  134* 134*  K 3.9   < > 4.4  --  4.0 3.8  CL 106   < > 102  --  102 101  CO2 22   < > 23  --  20* 24  GLUCOSE 112*   < > 122*  --  113* 99  BUN 12   < > 23  --  23 15  CREATININE 0.86   < > 1.23  --  1.03 0.84  CALCIUM 9.0   < > 8.5*  --  8.4* 8.5*  PROT 6.9  --   --  5.7*  --   --   ALBUMIN 3.9  --   --  3.4*  --   --   AST 33  --   --  29  --   --   ALT 36  --   --  19  --   --   ALKPHOS 70  --   --  44  --   --   BILITOT 0.7  --   --  0.8  --   --   GFRNONAA >60   < > 56*  --  >60 >60  GFRAA >60   <  > >60  --  >60 >60  ANIONGAP 9   < > 10  --  12 9   < > = values in this interval not displayed.     Hematology Recent Labs  Lab 11/18/17 0607 11/19/17 0217 11/20/17 0241  WBC 15.3* 13.1* 12.8*  RBC 3.20* 3.14* 3.09*  HGB 10.3* 10.2* 10.1*  HCT 32.6* 31.7* 31.2*  MCV 101.9* 101.0* 101.0*  MCH 32.2 32.5 32.7  MCHC 31.6 32.2 32.4  RDW 13.1 13.2 13.0  PLT 190 193 240    Cardiac EnzymesNo results for input(s): TROPONINI in the last 168 hours. No results for input(s): TROPIPOC in the last 168 hours.   BNPNo results for input(s): BNP, PROBNP in the last 168 hours.   DDimer No results for input(s): DDIMER in the last 168 hours.   Radiology    Dg Chest 2 View  Result Date: 11/20/2017 CLINICAL DATA:  Postoperative examination EXAM: CHEST - 2 VIEW COMPARISON:  11/19/2017; 11/18/2017; 11/16/2017; chest CT-11/09/2017; 05/16/2014 FINDINGS: Grossly unchanged borderline enlarged cardiac silhouette and mediastinal contours post stress at grossly unchanged enlarged cardiac silhouette and mediastinal contours post median sternotomy and CABG. There is persistent thickening the right paratracheal stripe presumably secondary prominent vasculature. Overall improved aeration of the lungs with persistent trace pleural effusions and associated bibasilar opacities. No new focal airspace opacities. No evidence of edema. No acute osseus abnormalities. Old left-sided rib fractures. Post ACDF of the lower cervical spine. Stigmata of DISH within the lower thoracic spine. Degenerative change within the superior aspect of the lumbar spine. IMPRESSION: 1. Overall improved aeration of the lungs suggests resolving edema and atelectasis. 2. Similar findings of trace bilateral effusions and associated bibasilar opacities, likely atelectasis. Electronically Signed   By: Sandi Mariscal M.D.   On: 11/20/2017 07:17    Cardiac Studies    Patient Profile     72 y.o. male with coronary artery disease and a  recent coronary  artery bypass grafting.  Assessment & Plan    1.  Coronary artery disease: The patient status post coronary artery bypass grafting. He is not having any pain.  2.  Postoperative atrial fibrillation:  Patient had some atrial fibrillation. He is back to normal sinus rhythm now.  Eliquis has been started. I would discharge him on amiodarone 200 mg twice a day.  We will decrease his dose to 200 mg once a day when he sees [place] in the office in several weeks. I would continue amiodarone 200 mg a day for 3 months.  Also continue Eliquis for 3 months And consider continuing these medications or other medications long-term if he has recurrent episodes of atrial fibrillation over the 2-month.        For questions or updates, please contact [place] Please consult [email or web address] for contact info under        Signed, Mertie Moores, MD  11/21/2017, 1:39 PM

## 2017-11-21 NOTE — Progress Notes (Signed)
CARDIAC REHAB PHASE I   PRE:  Rate/Rhythm: 67 SR    BP: sitting 132/77    SaO2: 96 RA  MODE:  Ambulation: 430 ft   POST:  Rate/Rhythm: 84 SR    BP: sitting 136/62     SaO2: 99 RA  Pt has been busy this am talking to people and bathing. Stood well but declined using RW. Pt has limited hip flexion and therefore uses short steps. Quick pace but increasing SOB with distance. SaO2 100 RA. Had pt rest x2. He became even more SOB toward end of walk and did not respond to questions. Very eager to get to bed. Sitting on EOB pt struggled to make appropriate decisions even though VSS. Pt was exhausted. Laid down and pt immediately asleep. Pts friend was present and we discussed some care at home as he is going to her house for a few days. He will then go to his son's house. Pt is not ready to go home today. Will ambulate with RW from now on to help conserve energy. Will f/u tomorrow. [id number]  [place], ACSM 11/21/2017 11:41 AM

## 2017-11-21 NOTE — Care Management Important Message (Signed)
Important Message  Patient Details  Name: Carl [name] MRN: [id number] Date of Birth: 10/23/1945   Medicare Important Message Given:  Yes    Ardian Haberland P Symia Herdt 11/21/2017, 11:21 AM

## 2017-11-21 NOTE — Care Management (Signed)
#   1.  S/W KAREN @ [facility]          # [phone]  ELIQUIS  5 MG BID COVER- YES CO-PAY- $ 45.00 TIER- 3 DRUG PRIOR APPROVAL- NO  PREFERRED PHARMACY : YES [facility]

## 2017-11-21 NOTE — Discharge Instructions (Addendum)
Coronary Artery Bypass Grafting, Care After   This sheet gives you information about how to care for yourself after your procedure. Your health care provider may also give you more specific instructions. If you have problems or questions, contact your health care provider. What can I expect after the procedure? After the procedure, it is common to have:  Nausea and a lack of appetite.  Constipation.  Weakness and fatigue.  Depression or irritability.  Pain or discomfort in your incision areas.  Follow these instructions at home: Medicines  Take over-the-counter and prescription medicines only as told by your health care provider. Do not stop taking medicines or start any new medicines without approval from your health care provider.  If you were prescribed an antibiotic medicine, take it as told by your health care provider. Do not stop taking the antibiotic even if you start to feel better.  Do not drive or use heavy machinery while taking prescription pain medicine. Incision care  Follow instructions from your health care provider about how to take care of your incisions. Make sure you: ? Wash your hands with soap and water before you change your bandage (dressing). If soap and water are not available, use hand sanitizer. ? Change your dressing as told by your health care provider. ? Leave stitches (sutures), skin glue, or adhesive strips in place. These skin closures may need to stay in place for 2 weeks or longer. If adhesive strip edges start to loosen and curl up, you may trim the loose edges. Do not remove adhesive strips completely unless your health care provider tells you to do that.  Keep incision areas clean, dry, and protected.  Check your incision areas every day for signs of infection. Check for: ? More redness, swelling, or pain. ? More fluid or blood. ? Warmth. ? Pus or a bad smell.  If incisions were made in your legs: ? Avoid crossing your legs. ? Avoid  sitting for long periods of time. Change positions every 30 minutes. ? Raise (elevate) your legs when you are sitting. Bathing  Do not take baths, swim, or use a hot tub until your health care provider approves.  Only take sponge baths. Pat the incisions dry. Do not rub incisions with a washcloth or towel.  Ask your health care provider when you can shower. Eating and drinking  Eat foods that are high in fiber, such as raw fruits and vegetables, whole grains, beans, and nuts. Meats should be lean cut. Avoid canned, processed, and fried foods. This can help prevent constipation and is a recommended part of a heart-healthy diet.  Drink enough fluid to keep your urine clear or pale yellow.  Limit alcohol intake to no more than 1 drink a day for nonpregnant women and 2 drinks a day for men. One drink equals 12 oz of beer, 5 oz of wine, or 1 oz of hard liquor. Activity  Rest and limit your activity as told by your health care provider. You may be instructed to: ? Stop any activity right away if you have chest pain, shortness of breath, irregular heartbeats, or dizziness. Get help right away if you have any of these symptoms. ? Move around frequently for short periods or take short walks as directed by your health care provider. Gradually increase your activities. You may need physical therapy or cardiac rehabilitation to help strengthen your muscles and build your endurance. ? Avoid lifting, pushing, or pulling anything that is heavier than 10 lb (4.5 kg)  for at least 6 weeks or as told by your health care provider.  Do not drive until your health care provider approves.  Ask your health care provider when you may return to work.  Ask your health care provider when you may resume sexual activity. General instructions  Do not use any products that contain nicotine or tobacco, such as cigarettes and e-cigarettes. If you need help quitting, ask your health care provider.  Take 2-3 deep  breaths every few hours during the day, while you recover. This helps expand your lungs and prevent complications like pneumonia after surgery.  If you were given a device called an incentive spirometer, use it several times a day to practice deep breathing. Support your chest with a pillow or your arms when you take deep breaths or cough.  Wear compression stockings as told by your health care provider. These stockings help to prevent blood clots and reduce swelling in your legs.  Weigh yourself every day. This helps identify if your body is holding (retaining) fluid that may make your heart and lungs work harder.  Keep all follow-up visits as told by your health care provider. This is important. Contact a health care provider if:  You have more redness, swelling, or pain around any incision.  You have more fluid or blood coming from any incision.  Any incision feels warm to the touch.  You have pus or a bad smell coming from any incision  You have a fever.  You have swelling in your ankles or legs.  You have pain in your legs.  You gain 2 lb (0.9 kg) or more a day.  You are nauseous or you vomit.  You have diarrhea. Get help right away if:  You have chest pain that spreads to your jaw or arms.  You are short of breath.  You have a fast or irregular heartbeat.  You notice a "clicking" in your breastbone (sternum) when you move.  You have numbness or weakness in your arms or legs.  You feel dizzy or light-headed. Summary  After the procedure, it is common to have pain or discomfort in the incision areas.  Do not take baths, swim, or use a hot tub until your health care provider approves.  Gradually increase your activities. You may need physical therapy or cardiac rehabilitation to help strengthen your muscles and build your endurance.  Weigh yourself every day. This helps identify if your body is holding (retaining) fluid that may make your heart and lungs work  harder. This information is not intended to replace advice given to you by your health care provider. Make sure you discuss any questions you have with your health care provider. Document Released: 08/13/2004 Document Revised: 12/14/2015 Document Reviewed: 12/14/2015 Elsevier Interactive Patient Education  2018 [place] on my medicine - ELIQUIS (apixaban)  This medication education was reviewed with me or my healthcare representative as part of my discharge preparation.  Why was Eliquis prescribed for you? Eliquis was prescribed for you to reduce the risk of a blood clot forming that can cause a stroke if you have a medical condition called atrial fibrillation (a type of irregular heartbeat).  What do You need to know about Eliquis ? Take your Eliquis TWICE DAILY - one tablet in the morning and one tablet in the evening with or without food. If you have difficulty swallowing the tablet whole please discuss with your pharmacist how to take the medication safely.  Take Eliquis exactly as  prescribed by your doctor and DO NOT stop taking Eliquis without talking to the doctor who prescribed the medication.  Stopping may increase your risk of developing a stroke.  Refill your prescription before you run out.  After discharge, you should have regular check-up appointments with your healthcare provider that is prescribing your Eliquis.  In the future your dose may need to be changed if your kidney function or weight changes by a significant amount or as you get older.  What do you do if you miss a dose? If you miss a dose, take it as soon as you remember on the same day and resume taking twice daily.  Do not take more than one dose of ELIQUIS at the same time to make up a missed dose.  Important Safety Information A possible side effect of Eliquis is bleeding. You should call your healthcare provider right away if you experience any of the following: ? Bleeding from an  injury or your nose that does not stop. ? Unusual colored urine (red or dark brown) or unusual colored stools (red or black). ? Unusual bruising for unknown reasons. ? A serious fall or if you hit your head (even if there is no bleeding).  Some medicines may interact with Eliquis and might increase your risk of bleeding or clotting while on Eliquis. To help avoid this, consult your healthcare provider or pharmacist prior to using any new prescription or non-prescription medications, including herbals, vitamins, non-steroidal anti-inflammatory drugs (NSAIDs) and supplements.  This website has more information on Eliquis (apixaban): http://www.eliquis.com/eliquis/home

## 2017-11-21 NOTE — Progress Notes (Addendum)
      [address]       [place] [address]             [phone]      5 Days Post-Op Procedure(s) (LRB): CORONARY ARTERY BYPASS GRAFTING (CABG) x4. LEFT ENDOSCOPIC SAPHENOUS VEIN HARVEST AND MAMMARY ARTERY TAKE DOWN. LIMA TO LAD, SVG TO PDA, SVG TO DISTAL CIRC & OMI. (N/A) TRANSESOPHAGEAL ECHOCARDIOGRAM (TEE) (N/A) Subjective: Shares that the food isn't very good here. He is looking forward to a home cooked meal.   Objective: Vital signs in last 24 hours: Temp:  [97.8 F (36.6 C)-98.8 F (37.1 C)] 98.7 F (37.1 C) (10/15 0358) Pulse Rate:  [30-84] 71 (10/15 0400) Cardiac Rhythm: Atrial fibrillation;Normal sinus rhythm (10/15 0400) Resp:  [13-29] 13 (10/15 0400) BP: (115-157)/(62-80) 130/79 (10/15 0358) SpO2:  [83 %-99 %] 93 % (10/15 0400) Weight:  [92.6 kg] 92.6 kg (10/15 0358)  Intake/Output from previous day: 10/14 0701 - 10/15 0700 In: 720 [P.O.:720] Out: -  Intake/Output this shift: No intake/output data recorded.  General appearance: alert, cooperative and no distress Heart: regular rate and rhythm, S1, S2 normal, no murmur, click, rub or gallop Lungs: clear to auscultation bilaterally Abdomen: soft, non-tender; bowel sounds normal; no masses,  no organomegaly Extremities: extremities normal, atraumatic, no cyanosis or edema Wound: clean and dry  Lab Results: Recent Labs    11/19/17 0217 11/20/17 0241  WBC 13.1* 12.8*  HGB 10.2* 10.1*  HCT 31.7* 31.2*  PLT 193 240   BMET:  Recent Labs    11/19/17 0217 11/20/17 0241  NA 134* 134*  K 4.0 3.8  CL 102 101  CO2 20* 24  GLUCOSE 113* 99  BUN 23 15  CREATININE 1.03 0.84  CALCIUM 8.4* 8.5*    PT/INR: No results for input(s): LABPROT, INR in the last 72 hours. ABG    Component Value Date/Time   PHART 7.334 (L) 11/16/2017 1831   HCO3 20.8 11/16/2017 1831   TCO2 23 11/17/2017 1614   ACIDBASEDEF 5.0 (H) 11/16/2017 1831   O2SAT 98.0 11/16/2017 1831   CBG (last 3)  Recent Labs   11/20/17 1129 11/20/17 1625 11/20/17 2100  GLUCAP 101* 105* 103*    Assessment/Plan: S/P Procedure(s) (LRB): CORONARY ARTERY BYPASS GRAFTING (CABG) x4. LEFT ENDOSCOPIC SAPHENOUS VEIN HARVEST AND MAMMARY ARTERY TAKE DOWN. LIMA TO LAD, SVG TO PDA, SVG TO DISTAL CIRC & OMI. (N/A) TRANSESOPHAGEAL ECHOCARDIOGRAM (TEE) (N/A)  1. CV-rate-controlled atrial fibrillation around 20:00 last night now NSR. On Amio 200mg  BID. Continue ASA, statin. BP has been stable, increase Metoprolol 12.5mg  BID to 25mg  BID.  2. Pulm-tolerating room air with good oxygenation.  3. Renal-creatinine 0.84, weight has been trending down, however remains about 4kg over baseline weight.  4. H and H 10.1/31.2, expected acute blood loss anemia 5. Endo-blood glucose level is well controlled. Discontinue Blood sugar checks.  6. TSH mildly elevated  Plan: Increase metoprolol today. Continue Amio 400mg  BID for a few days. Start Eliquis for intermittent atrial fibrillation. Weight is still up from baseline-continue daily Lasix. Possibly home . Tomorrow.    LOS: 6 days    Elgie Collard 11/21/2017  Plan d/c in am Now in sinus  I have seen and examined Denice Bors and agree with the above assessment  and plan.  [name] MD [facility] [phone] Office [phone] 11/21/2017 2:42 PM

## 2017-11-21 NOTE — Discharge Summary (Signed)
Physician Discharge Summary  Patient ID: Carl [name] MRN: [id number] DOB/AGE: [birth year] 72 y.o.  Admit date: 11/15/2017 Discharge date: 11/22/2017  Admission Diagnoses: Patient Active Problem List   Diagnosis Date Noted  . TSH elevation 11/21/2017  . Atrial fibrillation with rapid ventricular response ([facility])   . CAD in native artery 11/15/2017  . Abnormal cardiac CT angiography   . RLQ abdominal pain 07/07/2015  . Primary osteoarthritis of left knee 06/17/2014  . 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 Preoperative cardiovascular examination 04/19/2014  . Hyperlipidemia LDL goal <70 04/19/2014  . AP (abdominal pain) 01/16/2014  . History of diverticulitis 01/16/2014  . GERD 01/14/2009  . ULCER-GASTRIC 01/14/2009  . GASTRITIS 11/14/2008  . DIVERTICULITIS OF COLON 11/13/2008  . BLOOD IN STOOL 11/13/2008  . ABDOMINAL PAIN 11/13/2008  . PERSONAL HISTORY OF COLONIC POLYPS 11/13/2008    Discharge Diagnoses:  Active Problems:   Hyperlipidemia LDL goal <70   CAD in native artery   S/P CABG x 4   Atrial fibrillation with rapid ventricular response ([facility])   TSH elevation   Discharged Condition: good  HPI:   Carl [name] is a 72 year old male patient with a past medical history of hypertension, GERD, diverticulitis, hyperlipidemia, depression, anxiety, and alcohol use who presented to his cardiologist's office with complaints of right-sided shoulder pain and numbness on the right side of his face and right arm.  He recently returned from [place] where he was very active and did notice some shortness of breath.  He never did have any chest pain.  He has been followed by [name] for several years.  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  The patient currently denies any chest pain.  He does have chronic pain which radiates down both lower legs.  He did have bilateral carotid ultrasounds back in 2018 which  showed mild carotid atherosclerosis.  The degree of narrowing was less than 50% bilaterally.  He will need an arterial study of his lower extremity to ensure adequate blood flow for vein harvesting.  He has remained very active over the past few years and is a non-smoker.  He denies diabetes mellitus and previous stroke. We are consulted for possible surgical revascularization.    Hospital Course:   Carl [name] underwent a coronary bypass grafting x4 on 11/16/2017 with Dr. Servando Snare.  He tolerated the procedure well and was transferred to the surgical ICU.  He was extubated in a timely manner.  Postop day 1 we discontinued his chest tubes and lines.  He did have some mild ileus therefore we started Reglan and continued him on clear liquids only.  We began to mobilize the patient.  He was up and walking around the unit later that night.  Postop day 2 he developed rapid atrial fibrillation and was started on IV amiodarone bolus and drip protocol.  We contacted cardiology to assist with rhythm management.  Postop day 2 he remained in atrial fibrillation but was rate controlled.  We discontinued his Foley catheter.  Postop day 3 he was back to normal sinus rhythm in the 60s.  We monitored his TSH which was mildly elevated.  We plan for transfer to the stepdown unit for continued care.  We added lactulose for constipation.  We initiated a diuretic regimen for fluid overload.  Postop day 4 he did have an episode of rate controlled atrial fibrillation but soon after converted to normal sinus rhythm.  He was on Lopressor and amiodarone at the time.  We discontinued his epicardial pacing wires.  Postop day 5 we initiated Eliquis for anticoagulation.  He remained on Lasix for fluid overload.  His creatinine remained stable.  We increased his metoprolol to 25 mg twice daily for better rate control.  Today, he is ambulating with limited assistance, his incision is healing well, he is tolerating room air, and he is ready  for discharge home.  He will follow-up with cardiology for rhythm monitoring.   Consults: cardiology  Significant Diagnostic Studies:  CLINICAL DATA:  Postoperative examination  EXAM: CHEST - 2 VIEW  COMPARISON:  11/19/2017; 11/18/2017; 11/16/2017; chest CT-11/09/2017; 05/16/2014  FINDINGS: Grossly unchanged borderline enlarged cardiac silhouette and mediastinal contours post stress at grossly unchanged enlarged cardiac silhouette and mediastinal contours post median sternotomy and CABG. There is persistent thickening the right paratracheal stripe presumably secondary prominent vasculature. Overall improved aeration of the lungs with persistent trace pleural effusions and associated bibasilar opacities. No new focal airspace opacities. No evidence of edema. No acute osseus abnormalities. Old left-sided rib fractures. Post ACDF of the lower cervical spine. Stigmata of DISH within the lower thoracic spine. Degenerative change within the superior aspect of the lumbar spine.  IMPRESSION: 1. Overall improved aeration of the lungs suggests resolving edema and atelectasis. 2. Similar findings of trace bilateral effusions and associated bibasilar opacities, likely atelectasis.   Electronically Signed   By: Sandi Mariscal M.D.   On: 11/20/2017 07:17  Treatments:  NAME: Carl [name], Carl [name] MEDICAL RECORD [id number] ACCOUNT [id number] DATE OF BIRTH:1945/07/01  FACILITY: [facility] LOCATION: [id number] PHYSICIAN:EDWARD Maryruth Bun, MD  OPERATIVE REPORT  DATE OF PROCEDURE:  11/20/2017  PREOPERATIVE DIAGNOSIS:  Coronary occlusive disease with unstable angina.  POSTOPERATIVE DIAGNOSIS:  Coronary occlusive disease with unstable angina.  SURGICAL PROCEDURES:  Coronary artery bypass grafting x4 with left internal mammary to the left anterior descending coronary artery, reverse saphenous vein graft to the first obtuse marginal and distal circumflex, reverse saphenous vein graft to the   posterior descending coronary artery with left thigh and calf endoscopic vein harvest.  SURGEON:  Lanelle Bal, MD  FIRST ASSISTANT:  Nicholes Rough, PA.   Discharge Exam: Blood pressure (!) 145/68, pulse 68, temperature 97.6 F (36.4 C), temperature source Oral, resp. rate (!) 31, height 5\' 8"  (1.727 m), weight 91.7 kg, SpO2 96 %.   General appearance: alert, cooperative and no distress Heart: regular rate and rhythm, S1, S2 normal, no murmur, click, rub or gallop Lungs: clear to auscultation bilaterally Abdomen: soft, non-tender; bowel sounds normal; no masses,  no organomegaly Extremities: extremities normal, atraumatic, no cyanosis or edema Wound: clean and dry  Disposition: Discharge disposition: 01-Home or Self Care       Discharge Instructions    Amb Referral to Cardiac Rehabilitation   Complete by:  As directed    Diagnosis:  CABG   CABG X ___:  4     Allergies as of 11/22/2017   No Known Allergies     Medication List    STOP taking these medications   amLODipine 5 MG tablet Commonly known as:  NORVASC   ciprofloxacin-dexamethasone OTIC suspension Commonly known as:  CIPRODEX   metroNIDAZOLE 500 MG tablet Commonly known as:  FLAGYL   nitroGLYCERIN 0.4 MG SL tablet Commonly known as:  NITROSTAT   simvastatin 40 MG tablet Commonly known as:  ZOCOR     TAKE these medications   acetaminophen 325 MG tablet Commonly known as:  TYLENOL  Take 2 tablets (650 mg total) by mouth every 6 (six) hours as needed for mild pain.   acyclovir 400 MG tablet Commonly known as:  ZOVIRAX Take 400 mg by mouth 2 (two) times daily.   ALPRAZolam 0.25 MG tablet Commonly known as:  XANAX Take 0.25 mg by mouth daily as needed for anxiety.   amiodarone 200 MG tablet Commonly known as:  PACERONE Take 1 tablet (200 mg total) by mouth 2 (two) times daily.   apixaban 5 MG Tabs tablet Commonly known as:  ELIQUIS Take 1 tablet (5 mg total) by mouth 2 (two) times  daily.   aspirin EC 81 MG tablet Take 1 tablet (81 mg total) by mouth daily.   ciprofloxacin 500 MG tablet Commonly known as:  CIPRO TAKE 1 TABLET(500 MG) BY MOUTH TWICE DAILY What changed:    how much to take  how to take this  when to take this  reasons to take this   citalopram 20 MG tablet Commonly known as:  CELEXA Take 20 mg by mouth daily.   [employer] JOINT HEALTH TRIPLE ACTION PO Take 1 tablet by mouth daily. Notes to patient:  Follow you home regimen.   furosemide 40 MG tablet Commonly known as:  LASIX Take 1 tablet (40 mg total) by mouth daily for 5 days.   Magnesium Carbonate Powd Take 4 g by mouth daily. Natural Vitality Calm   metoprolol tartrate 25 MG tablet Commonly known as:  LOPRESSOR Take 1 tablet (25 mg total) by mouth 2 (two) times daily. What changed:    medication strength  how much to take  how to take this  when to take this  additional instructions   oxyCODONE 5 MG immediate release tablet Commonly known as:  Oxy IR/ROXICODONE Take 1 tablet (5 mg total) by mouth every 6 (six) hours as needed for severe pain.   pantoprazole 20 MG tablet Commonly known as:  PROTONIX TAKE 2 TABLETS(40 MG) BY MOUTH DAILY What changed:    how much to take  how to take this  when to take this   potassium chloride SA 20 MEQ tablet Commonly known as:  K-DUR,KLOR-CON Take 1 tablet (20 mEq total) by mouth daily for 5 days.   PRESERVISION AREDS 2 PO Take 1 tablet by mouth 2 (two) times daily. Notes to patient:  Follow your home regimen.   rosuvastatin 40 MG tablet Commonly known as:  CRESTOR Take 1 tablet (40 mg total) by mouth daily at 6 PM.   zolpidem 10 MG tablet Commonly known as:  AMBIEN Take 10 mg by mouth at bedtime.            Durable Medical Equipment  (From admission, onward)         Start     Ordered   11/22/17 1024  For home use only DME Walker rolling  Once    Question:  Patient needs a walker to treat with the following  condition  Answer:  Weakness   11/22/17 1024         Follow-up Information    Little, Lennette Bihari, MD. Call in 1 day(s).   Specialty:  Family Medicine Contact information: [place] [address] [phone]        Troy Sine, MD Follow up.   Specialty:  Cardiology Why:  refer to discharge paperwork for appointment Contact information: [address] [place] [address] [phone]        [name],  MD Follow up.   Specialty:  Cardiothoracic Surgery Why:  Your routine follow-up appointment is on 12/25/2017 at 1:00pm. Please arrive at 12:30pm for a chest xray located at [facility] which is on the first floor of our building.  Contact information: [address] [place] [address] [facility] Follow up.   Why:  rolling walker arranged- to be delivered to room prior to discharge Contact information: [address] [place] [address] [phone]          The patient has been discharged on:   1.Beta Blocker:  Yes [ yes  ]                              No   [   ]                              If No, reason:  2.Ace Inhibitor/ARB: Yes [   ]                                     No  [  no  ]                                     If No, reason: titration of BB  3.Statin:   Yes [ yes  ]                  No  [   ]                  If No, reason:  4.Ecasa:  Yes  [ yes  ]                  No   [   ]                  If No, reason:    Signed: Elgie Collard 11/22/2017, 2:00 PM

## 2017-11-21 NOTE — Progress Notes (Signed)
Verbal order received from [facility], to discontinue Blood sugar monitoring.

## 2017-11-22 LAB — GLUCOSE, CAPILLARY: Glucose-Capillary: 110 mg/dL — ABNORMAL HIGH (ref 70–99)

## 2017-11-22 MED ORDER — POTASSIUM CHLORIDE CRYS ER 20 MEQ PO TBCR: 20.0000 meq | EXTENDED_RELEASE_TABLET | Freq: Every day | ORAL | 0 refills | Status: DC

## 2017-11-22 MED ORDER — APIXABAN 5 MG PO TABS: 5.0000 mg | ORAL_TABLET | Freq: Two times a day (BID) | ORAL | 1 refills | Status: DC

## 2017-11-22 MED ORDER — ROSUVASTATIN CALCIUM 40 MG PO TABS: 40.0000 mg | ORAL_TABLET | Freq: Every day | ORAL | 1 refills | Status: DC

## 2017-11-22 MED ORDER — FUROSEMIDE 40 MG PO TABS: 40.0000 mg | ORAL_TABLET | Freq: Every day | ORAL | 0 refills | Status: DC

## 2017-11-22 MED ORDER — AMIODARONE HCL 200 MG PO TABS: 200.0000 mg | ORAL_TABLET | Freq: Two times a day (BID) | ORAL | 1 refills | Status: DC

## 2017-11-22 MED ORDER — METOPROLOL TARTRATE 25 MG PO TABS: 25.0000 mg | ORAL_TABLET | Freq: Two times a day (BID) | ORAL | 1 refills | Status: DC

## 2017-11-22 MED ORDER — OXYCODONE HCL 5 MG PO TABS: 5.0000 mg | ORAL_TABLET | Freq: Four times a day (QID) | ORAL | 0 refills | Status: DC | PRN

## 2017-11-22 MED ORDER — ACETAMINOPHEN 325 MG PO TABS: 650.0000 mg | ORAL_TABLET | Freq: Four times a day (QID) | ORAL | Status: DC | PRN

## 2017-11-22 MED FILL — FUROSEMIDE 40 MG TABLET: 40 | 5 days supply | Qty: 5 | Fill #0

## 2017-11-22 MED FILL — AMIODARONE HCL 200 MG TABS: 200 | 30 days supply | Qty: 60 | Fill #0 | Status: TO

## 2017-11-22 MED FILL — ELIQUIS 5 MG TABLET: 5 | 30 days supply | Qty: 60 | Fill #0 | Status: TO

## 2017-11-22 MED FILL — ROSUVASTATIN CALCIUM 40 MG: 40 | 30 days supply | Qty: 30 | Fill #0 | Status: TO

## 2017-11-22 MED FILL — METOPROLOL TARTRATE 25 MG T: 25 | 30 days supply | Qty: 60 | Fill #0 | Status: TO

## 2017-11-22 MED FILL — oxyCODONE HCL 5 MG TABS: 5 | 7 days supply | Qty: 30 | Fill #0

## 2017-11-22 MED FILL — POTASSIUM CL ER 20 MEQ TABL: 20 | 5 days supply | Qty: 5 | Fill #0

## 2017-11-22 NOTE — Progress Notes (Addendum)
      [address]       [place], [address]             [phone]      6 Days Post-Op Procedure(s) (LRB): CORONARY ARTERY BYPASS GRAFTING (CABG) x4. LEFT ENDOSCOPIC SAPHENOUS VEIN HARVEST AND MAMMARY ARTERY TAKE DOWN. LIMA TO LAD, SVG TO PDA, SVG TO DISTAL CIRC & OMI. (N/A) TRANSESOPHAGEAL ECHOCARDIOGRAM (TEE) (N/A) Subjective: Feels good this morning and is ready for home.   Objective: Vital signs in last 24 hours: Temp:  [97.2 F (36.2 C)-97.9 F (36.6 C)] 97.2 F (36.2 C) (10/16 0307) Pulse Rate:  [47-72] 69 (10/15 1930) Cardiac Rhythm: Normal sinus rhythm;Bundle branch block (10/16 0307) Resp:  [17-24] 20 (10/16 0307) BP: (101-143)/(60-79) 122/61 (10/16 0307) SpO2:  [94 %-100 %] 96 % (10/16 0307) Weight:  [91.7 kg] 91.7 kg (10/16 0258)     Intake/Output from previous day: 10/15 0701 - 10/16 0700 In: 1200 [P.O.:1200] Out: -  Intake/Output this shift: No intake/output data recorded.  General appearance: alert, cooperative and no distress Heart: regular rate and rhythm, S1, S2 normal, no murmur, click, rub or gallop Lungs: clear to auscultation bilaterally Abdomen: soft, non-tender; bowel sounds normal; no masses,  no organomegaly Extremities: extremities normal, atraumatic, no cyanosis or edema Wound: clean and dry  Lab Results: Recent Labs    11/20/17 0241  WBC 12.8*  HGB 10.1*  HCT 31.2*  PLT 240   BMET:  Recent Labs    11/20/17 0241  NA 134*  K 3.8  CL 101  CO2 24  GLUCOSE 99  BUN 15  CREATININE 0.84  CALCIUM 8.5*    PT/INR: No results for input(s): LABPROT, INR in the last 72 hours. ABG    Component Value Date/Time   PHART 7.334 (L) 11/16/2017 1831   HCO3 20.8 11/16/2017 1831   TCO2 23 11/17/2017 1614   ACIDBASEDEF 5.0 (H) 11/16/2017 1831   O2SAT 98.0 11/16/2017 1831   CBG (last 3)  Recent Labs    11/20/17 1625 11/20/17 2100 11/22/17 0609  GLUCAP 105* 103* 110*    Assessment/Plan: S/P Procedure(s)  (LRB): CORONARY ARTERY BYPASS GRAFTING (CABG) x4. LEFT ENDOSCOPIC SAPHENOUS VEIN HARVEST AND MAMMARY ARTERY TAKE DOWN. LIMA TO LAD, SVG TO PDA, SVG TO DISTAL CIRC & OMI. (N/A) TRANSESOPHAGEAL ECHOCARDIOGRAM (TEE) (N/A)  1. CV-intermittent rate-controlled atrial fibrillation nowNSR. On Amio 400mg  BID. Continue ASA, Eliquis, and statin. BP has been stable, increase Metoprolol 25mg  BID.  2. Pulm-tolerating room air with good oxygenation.  3. Renal-creatinine 0.84, weight has been trending down, however remains about 3kg over baseline weight.  4. H and H 10.1/31.2, expected acute blood loss anemia 5. Endo-blood glucose level is well controlled. Discontinue Blood sugar checks.  6. TSH mildly elevated  Plan: Will decrease the Amio to 200mg  BID for discharge per cardiology. Discharge today. Will follow-up in the office.    LOS: 7 days    Elgie Collard 11/22/2017  Plan d/c today Wounds intact and well healed I have seen and examined Carl [name] and agree with the above assessment  and plan.  [name] MD Beeper [phone] Office [phone] 11/22/2017 8:55 AM

## 2017-11-22 NOTE — Progress Notes (Signed)
CARDIAC REHAB PHASE I   PRE:  Rate/Rhythm: 63 SR    BP: sitting 117/68    SaO2: 95 RA  MODE:  Ambulation: 430 ft   POST:  Rate/Rhythm: 84 SR    BP: sitting 139/78     SaO2: 96 RA  Pt quite sleepy this am. Able to listen to most of education but falling asleep at times. He thinks he did not have restful sleep last night. Asked if he could walk to help wake up. Ambulation much improved today with RW. Steady, controlled pace. Slight SOB but able to walk and talk without any c/o. To recliner. RW really helped him with energy conservation and CM is ordering one for home. Voiced understanding with education and will refer to [place]. [place], ACSM 11/22/2017 11:18 AM

## 2017-11-22 NOTE — Care Management Note (Signed)
Case Management Note Marvetta Gibbons RN, BSN Transitions of Care Unit 4E- RN Case Manager [phone]  Patient Details  Name: Carl [name] MRN: [id number] Date of Birth: [birth year]  Subjective/Objective:   Pt admitted with CAD s/p CABG with post op afib                 Action/Plan: PTA pt lived at home, started on new Eliquis- per benefits check copay $45. Informed by nursing staff pt will need RW for home. DME order placed and notified James with [facility] for DME need- RW to be delivered to room prior to discharge. [place] pharmacy will speak to pt regarding possibly filling meds prior to transition home.   Expected Discharge Date:  11/22/17               Expected Discharge Plan:  Home/Self Care  In-House Referral:  NA  Discharge planning Services  CM Consult  Post Acute Care Choice:  Durable Medical Equipment Choice offered to:  Patient  DME Arranged:  Gilford Rile rolling DME Agency:  [place]:  NA [place] Agency:  NA  Status of Service:  Completed, signed off  If discussed at [place] of Stay Meetings, dates discussed:    Discharge Disposition: home/self care   Additional Comments:  Dawayne Patricia, RN 11/22/2017, 10:56 AM

## 2017-11-22 NOTE — Progress Notes (Signed)
Pt discharged from hospital at 12:25 pm. All of his belongings given back to Pt. AVS put in envelope given with all discharged instructions and medication from  Pharmacist. Pt and his significant others ( who he stated she is his girlfriend) had fully understanding. Answered all questions and they have no futher question at this time. He leaved the unit with a Engineer, manufacturing via wheelchair.   Kennyth Lose, RN.

## 2017-11-23 ENCOUNTER — Telehealth (HOSPITAL_COMMUNITY): Payer: Self-pay

## 2017-11-23 ENCOUNTER — Telehealth: Payer: Self-pay

## 2017-11-23 NOTE — Telephone Encounter (Signed)
Called patient to see if he is interested in the Cardiac Rehab Program. Patient expressed interest. Adv pt she would need to schedule and complete her follow up appt with [name] before she can join CR. Patient verbalized understanding and stated he will give them a call.   Placed in no f/u appt folder

## 2017-11-23 NOTE — Telephone Encounter (Signed)
Attempted to call patient to verify follow up appt. No answer, mailbox full unable to leave VM.

## 2017-11-23 NOTE — Telephone Encounter (Signed)
Pt insurance is active and benefits verified through [employer]. Co-pay $15.00, DED $0.00/$0.00 met, out of pocket $3,400.00/$585.00 met, co-insurance 0%. No pre-authorization. [place]/[employer], 11/23/17 @ 2:33PM, REF# [id number]

## 2017-11-23 NOTE — Telephone Encounter (Signed)
Patient's friend, Andy Gauss contacted the office with questions about discharge medications and over-the-counter medications he can take after surgery.  She stated that he had a prescription for PRN Cipro for diverticulitis flare ups which he was to only take at that time.  I advised to continue to take that medication as he was before he was admitted to the hospital as that medication was on his "prior to hospitalization" medication list.  She also stated that he has a chronic problem with sinus drainage and was told to not take any medications over the counter without asking first.  I advised that he can take benadryl at night which would also help with his sleep.  I advised for him to start with half a tablet and could increase to a whole tablet if needed.  She acknowledged receipt.  She also asked if he could take Flonase, to which I advised, yes.  All questions were answered.

## 2017-11-29 DIAGNOSIS — I480 Paroxysmal atrial fibrillation: Secondary | ICD-10-CM | POA: Diagnosis not present

## 2017-11-29 DIAGNOSIS — Z951 Presence of aortocoronary bypass graft: Secondary | ICD-10-CM | POA: Diagnosis not present

## 2017-11-29 DIAGNOSIS — Z79899 Other long term (current) drug therapy: Secondary | ICD-10-CM | POA: Diagnosis not present

## 2017-11-29 DIAGNOSIS — I2510 Atherosclerotic heart disease of native coronary artery without angina pectoris: Secondary | ICD-10-CM | POA: Diagnosis not present

## 2017-12-04 ENCOUNTER — Ambulatory Visit (HOSPITAL_COMMUNITY)
Admission: RE | Admit: 2017-12-04 | Discharge: 2017-12-04 | Disposition: A | Discharge status: Home / Self Care | Payer: PPO | Source: Ambulatory Visit | Attending: Physician Assistant | Admitting: Physician Assistant

## 2017-12-04 ENCOUNTER — Ambulatory Visit (INDEPENDENT_AMBULATORY_CARE_PROVIDER_SITE_OTHER): Payer: Self-pay | Admitting: Physician Assistant

## 2017-12-04 ENCOUNTER — Telehealth: Payer: Self-pay

## 2017-12-04 VITALS — BP 121/66 | HR 60 | Temp 97.5°F | Resp 20 | Ht 68.0 in | Wt 187.0 lb

## 2017-12-04 DIAGNOSIS — M7989 Other specified soft tissue disorders: Secondary | ICD-10-CM | POA: Insufficient documentation

## 2017-12-04 DIAGNOSIS — M79604 Pain in right leg: Secondary | ICD-10-CM | POA: Diagnosis not present

## 2017-12-04 DIAGNOSIS — Z951 Presence of aortocoronary bypass graft: Secondary | ICD-10-CM

## 2017-12-04 DIAGNOSIS — I2510 Atherosclerotic heart disease of native coronary artery without angina pectoris: Secondary | ICD-10-CM

## 2017-12-04 NOTE — Telephone Encounter (Signed)
-----   Message from [name], MD sent at 12/03/2017  5:04 PM EDT ----- Carl [name], Please have this patient come in for wound check on his leg tomorrow.  Thanks  EBG

## 2017-12-04 NOTE — Telephone Encounter (Signed)
Patient called per Dr. Servando Snare to give results of left lower extremity doppler.  Patient given negative results for DVT.  Advised patient to come back into the office next week, Thursday 12/14/17 to be seen by Dr. Servando Snare to be re-evaluated and check on progression of LLE.  All questions answered. He was very appreciative of the return call.

## 2017-12-04 NOTE — Progress Notes (Signed)
HPI:  [name] is a 72 yo male who is S/P CABG x 4 on 11/20/2017.  He contacted the office with complaints of new onset acute LLE swelling redness, and pain in the back of his calf.  He states this was present upon awakening.  He denies fever.  Current Outpatient Medications  Medication Sig Dispense Refill  . acetaminophen (TYLENOL) 325 MG tablet Take 2 tablets (650 mg total) by mouth every 6 (six) hours as needed for mild pain.    Marland Kitchen acyclovir (ZOVIRAX) 400 MG tablet Take 400 mg by mouth 2 (two) times daily.    Marland Kitchen ALPRAZolam (XANAX) 0.25 MG tablet Take 0.25 mg by mouth daily as needed for anxiety.   0  . amiodarone (PACERONE) 200 MG tablet Take 1 tablet (200 mg total) by mouth 2 (two) times daily. 60 tablet 1  . apixaban (ELIQUIS) 5 MG TABS tablet Take 1 tablet (5 mg total) by mouth 2 (two) times daily. 60 tablet 1  . aspirin EC 81 MG tablet Take 1 tablet (81 mg total) by mouth daily. 90 tablet 3  . ciprofloxacin (CIPRO) 500 MG tablet TAKE 1 TABLET(500 MG) BY MOUTH TWICE DAILY (Patient taking differently: Take 500 mg by mouth 2 (two) times daily as needed (for diverticulitis flare ups). TAKE 1 TABLET(500 MG) BY MOUTH TWICE DAILY) 20 tablet 0  . citalopram (CELEXA) 20 MG tablet Take 20 mg by mouth daily.    . Collagen-Boron-Hyaluronic Acid ([employer] JOINT HEALTH TRIPLE ACTION PO) Take 1 tablet by mouth daily.    . Magnesium Carbonate POWD Take 4 g by mouth daily. Natural Vitality Calm    . metoprolol tartrate (LOPRESSOR) 25 MG tablet Take 1 tablet (25 mg total) by mouth 2 (two) times daily. 60 tablet 1  . Multiple Vitamins-Minerals (PRESERVISION AREDS 2 PO) Take 1 tablet by mouth 2 (two) times daily.    Marland Kitchen oxyCODONE (OXY IR/ROXICODONE) 5 MG immediate release tablet Take 1 tablet (5 mg total) by mouth every 6 (six) hours as needed for severe pain. 30 tablet 0  . pantoprazole (PROTONIX) 20 MG tablet TAKE 2 TABLETS(40 MG) BY MOUTH DAILY (Patient taking differently: Take 40 mg by mouth daily. TAKE 2  TABLETS(40 MG) BY MOUTH DAILY) 8 tablet 0  . rosuvastatin (CRESTOR) 40 MG tablet Take 1 tablet (40 mg total) by mouth daily at 6 PM. 30 tablet 1  . zolpidem (AMBIEN) 10 MG tablet Take 10 mg by mouth at bedtime.     . furosemide (LASIX) 40 MG tablet Take 1 tablet (40 mg total) by mouth daily for 5 days. 5 tablet 0  . potassium chloride SA (K-DUR,KLOR-CON) 20 MEQ tablet Take 1 tablet (20 mEq total) by mouth daily for 5 days. 5 tablet 0   No current facility-administered medications for this visit.     Physical Exam:  BP 121/66   Pulse 60   Temp (!) 97.5 F (36.4 C) (Oral)   Resp 20   Ht 5\' 8"  (1.727 m)   Wt 187 lb (84.8 kg)   SpO2 97% Comment: RA  BMI 28.43 kg/m   Gen: no apparent distress Heart: RRR Lungs: CTA bilaterally Ext: LLE is erythematous, no signs of infection at [facility] sites, pain along back of calf to palpation.  A/P;  1. New onset left leg pain/tightness with redness and pain along back of calf, EVH sites are not infected 2. Dispo- concerning for DVT, patient is on Eliqius but will get venous duplex to R/O DVT  Ellwood Handler, PA-C [facility] [phone]

## 2017-12-04 NOTE — Telephone Encounter (Signed)
Spoke with patient's caregiver, Jocelyn Lamer.  Patient is coming in today for a wound check at 12 pm.  She will relay the message to the patient.

## 2017-12-04 NOTE — Progress Notes (Signed)
Left lower extremity venous duplex has been completed. Negative for DVT. Results were given to [facility] at [facility] [place] office.  12/04/17 4:34 PM Carlos Levering RVT

## 2017-12-13 ENCOUNTER — Other Ambulatory Visit: Payer: Self-pay | Admitting: Cardiothoracic Surgery

## 2017-12-13 DIAGNOSIS — I2510 Atherosclerotic heart disease of native coronary artery without angina pectoris: Secondary | ICD-10-CM

## 2017-12-14 ENCOUNTER — Ambulatory Visit (INDEPENDENT_AMBULATORY_CARE_PROVIDER_SITE_OTHER): Payer: Self-pay | Admitting: Cardiothoracic Surgery

## 2017-12-14 ENCOUNTER — Encounter: Payer: Self-pay | Admitting: Cardiothoracic Surgery

## 2017-12-14 ENCOUNTER — Other Ambulatory Visit: Payer: Self-pay

## 2017-12-14 ENCOUNTER — Ambulatory Visit
Admission: RE | Admit: 2017-12-14 | Discharge: 2017-12-14 | Disposition: A | Discharge status: Home / Self Care | Payer: PPO | Source: Ambulatory Visit | Attending: Cardiothoracic Surgery | Admitting: Cardiothoracic Surgery

## 2017-12-14 VITALS — BP 120/68 | HR 51 | Resp 18 | Ht 68.0 in | Wt 190.8 lb

## 2017-12-14 DIAGNOSIS — Z951 Presence of aortocoronary bypass graft: Secondary | ICD-10-CM

## 2017-12-14 DIAGNOSIS — I2510 Atherosclerotic heart disease of native coronary artery without angina pectoris: Secondary | ICD-10-CM

## 2017-12-14 DIAGNOSIS — J9811 Atelectasis: Secondary | ICD-10-CM | POA: Diagnosis not present

## 2017-12-14 DIAGNOSIS — J90 Pleural effusion, not elsewhere classified: Secondary | ICD-10-CM | POA: Diagnosis not present

## 2017-12-14 NOTE — Progress Notes (Signed)
[address]       [place] [address]             [phone]      [place] D [place] Medical Record #[id number] Date of Birth: [birth year]  Referring: Troy Sine, MD Primary Care: Hulan Fess, MD Primary Cardiologist: Shelva Majestic, MD   Chief Complaint:   POST OP FOLLOW UP 11/20/2017 PREOPERATIVE DIAGNOSIS:  Coronary occlusive disease with unstable angina. POSTOPERATIVE DIAGNOSIS:  Coronary occlusive disease with unstable angina. SURGICAL PROCEDURES:  Coronary artery bypass grafting x4 with left internal mammary to the left anterior descending coronary artery, reverse saphenous vein graft to the first obtuse marginal and distal circumflex, reverse saphenous vein graft to the  posterior descending coronary artery with left thigh and calf endoscopic vein harvest. SURGEON:  Lanelle Bal, MD  History of Present Illness:     Patient returns to the office today in follow-up after his recent urgent coronary artery bypass grafting on 11/20/2017.  Patient had some difficulty with swelling in his leg especially the left leg but this is gradually resolved he is ambulating several miles a day without difficulty.  He has had no known recurrent atrial fibrillation since discharge home, he is on Eliquis for postoperative atrial fib. So far he is not enrolled in cardiac rehab.    Past Medical History:  Diagnosis Date  . Anxiety    takes Xanax daily as needed  . Arthritis   . Cataract    removed both eyes  . Depression    takes Celexa daily  . Diverticulitis   . Diverticulosis   . Enlarged prostate    sightly  . Esophagitis   . Gastric ulcer   . GERD (gastroesophageal reflux disease)    takes Protonix daily  . Hx of adenomatous colonic polyps    benign  . Hyperlipidemia    takes Zocor daily  . Insomnia    takes Ambien nightly  . Joint pain   . Joint swelling      Social History   Tobacco Use  Smoking Status Never Smoker  Smokeless  Tobacco Never Used    Social History   Substance and Sexual Activity  Alcohol Use Yes  . Alcohol/week: 10.0 standard drinks  . Types: 10 Standard drinks or equivalent per week   Comment: 2 drinks 5 nights per week     No Known Allergies  Current Outpatient Medications  Medication Sig Dispense Refill  . acetaminophen (TYLENOL) 325 MG tablet Take 2 tablets (650 mg total) by mouth every 6 (six) hours as needed for mild pain.    Marland Kitchen acyclovir (ZOVIRAX) 400 MG tablet Take 400 mg by mouth 2 (two) times daily.    Marland Kitchen amiodarone (PACERONE) 200 MG tablet Take 1 tablet (200 mg total) by mouth 2 (two) times daily. 60 tablet 1  . apixaban (ELIQUIS) 5 MG TABS tablet Take 1 tablet (5 mg total) by mouth 2 (two) times daily. 60 tablet 1  . aspirin EC 81 MG tablet Take 1 tablet (81 mg total) by mouth daily. 90 tablet 3  . citalopram (CELEXA) 20 MG tablet Take 20 mg by mouth daily.    . Collagen-Boron-Hyaluronic Acid ([employer]S JOINT HEALTH TRIPLE ACTION PO) Take 1 tablet by mouth daily.    . Magnesium Carbonate POWD Take 4 g by mouth daily. Natural Vitality Calm    . metoprolol tartrate (LOPRESSOR) 25 MG tablet Take 1 tablet (25 mg total) by mouth 2 (  two) times daily. 60 tablet 1  . Multiple Vitamins-Minerals (PRESERVISION AREDS 2 PO) Take 1 tablet by mouth 2 (two) times daily.    . pantoprazole (PROTONIX) 20 MG tablet TAKE 2 TABLETS(40 MG) BY MOUTH DAILY (Patient taking differently: Take 40 mg by mouth daily. TAKE 2 TABLETS(40 MG) BY MOUTH DAILY) 8 tablet 0  . rosuvastatin (CRESTOR) 40 MG tablet Take 1 tablet (40 mg total) by mouth daily at 6 PM. 30 tablet 1  . zolpidem (AMBIEN) 10 MG tablet Take 10 mg by mouth at bedtime.      No current facility-administered medications for this visit.        Physical Exam: BP 120/68 (BP Location: Left Arm, Patient Position: Sitting, Cuff Size: Normal)   Pulse (!) 51   Resp 18   Ht 5\' 8"  (1.727 m)   Wt 190 lb 12.8 oz (86.5 kg)   SpO2 95% Comment: RA  BMI 29.01  kg/m   General appearance: alert, cooperative and no distress Neurologic: intact Heart: regular rate and rhythm, S1, S2 normal, no murmur, click, rub or gallop Lungs: clear to auscultation bilaterally Abdomen: soft, non-tender; bowel sounds normal; no masses,  no organomegaly Extremities: extremities normal, atraumatic, no cyanosis or edema and Homans sign is negative, no sign of DVT Wound: Sternum is stable and well-healed leg incisions are also healed small suture was removed no sign of infection swelling has decreased   Diagnostic Studies & Laboratory data:     Recent Radiology Findings:   Dg Chest 2 View  Result Date: 12/14/2017 CLINICAL DATA:  Hx CABG 11/16/2017, no complaints, no smoke EXAM: CHEST - 2 VIEW COMPARISON:  11/20/2017 FINDINGS: Median sternotomy and CABG. The heart is mildly enlarged and stable in configuration. There is persistent small LEFT pleural effusion and LEFT basilar atelectasis. However aeration at the lung bases has improved significantly since the prior study. No pulmonary edema. Previous cervical fusion. Thoracic spondylosis. Numerous old LEFT-sided rib fractures. IMPRESSION: Significantly improved aeration. Small residual LEFT pleural effusion and atelectasis. Electronically Signed   By: Nolon Nations M.D.   On: 12/14/2017 14:14      Recent Lab Findings: Lab Results  Component Value Date   WBC 12.8 (H) 11/20/2017   HGB 10.1 (L) 11/20/2017   HCT 31.2 (L) 11/20/2017   PLT 240 11/20/2017   GLUCOSE 99 11/20/2017   CHOL 170 11/14/2017   TRIG 231 (H) 11/14/2017   HDL 48 11/14/2017   LDLCALC 76 11/14/2017   ALT 19 11/18/2017   AST 29 11/18/2017   NA 134 (L) 11/20/2017   K 3.8 11/20/2017   CL 101 11/20/2017   CREATININE 0.84 11/20/2017   BUN 15 11/20/2017   CO2 24 11/20/2017   TSH 7.798 (H) 11/18/2017   INR 1.35 11/16/2017   HGBA1C 5.4 11/15/2017      Assessment / Plan:      Status post coronary artery bypass grafting, patient making good  progress, cautioned about lifting over 20 pounds for 3 months, encouraged to enroll in cardiac rehab I have made the referral To see cardiology next week will need to address this Pacerone, mildly elevated TSH and Eliquis.  Plan to see him back in 1 month      [name] MD      [address] [place] [address] Office [phone]   Beeper [phone]  12/14/2017 3:48 PM

## 2017-12-18 ENCOUNTER — Ambulatory Visit: Payer: PPO | Admitting: Cardiovascular Disease

## 2017-12-19 NOTE — Telephone Encounter (Signed)
Called patient to see if he was interested in participating in the Cardiac Rehab Program. Patient stated yes. Patient will come in for orientation on 02/15/2018 @ 8:30AM and will attend the 9:45AM exercise class.  Mailed homework package.  Also adv pt he will need to schedule and complete his follow up appt with [name] before he can start CR. Pt verbalized understanding and stated he will call [name] office today.

## 2017-12-25 ENCOUNTER — Ambulatory Visit: Payer: PPO

## 2017-12-26 ENCOUNTER — Other Ambulatory Visit: Payer: Self-pay | Admitting: Physician Assistant

## 2017-12-29 ENCOUNTER — Ambulatory Visit: Payer: PPO | Admitting: Cardiovascular Disease

## 2017-12-29 ENCOUNTER — Encounter: Payer: Self-pay | Admitting: Cardiovascular Disease

## 2017-12-29 VITALS — BP 138/66 | HR 49 | Ht 68.0 in | Wt 190.8 lb

## 2017-12-29 DIAGNOSIS — Z7901 Long term (current) use of anticoagulants: Secondary | ICD-10-CM

## 2017-12-29 DIAGNOSIS — N529 Male erectile dysfunction, unspecified: Secondary | ICD-10-CM

## 2017-12-29 DIAGNOSIS — I10 Essential (primary) hypertension: Secondary | ICD-10-CM

## 2017-12-29 DIAGNOSIS — I2510 Atherosclerotic heart disease of native coronary artery without angina pectoris: Secondary | ICD-10-CM | POA: Diagnosis not present

## 2017-12-29 DIAGNOSIS — I480 Paroxysmal atrial fibrillation: Secondary | ICD-10-CM

## 2017-12-29 DIAGNOSIS — R7989 Other specified abnormal findings of blood chemistry: Secondary | ICD-10-CM

## 2017-12-29 DIAGNOSIS — E7800 Pure hypercholesterolemia, unspecified: Secondary | ICD-10-CM

## 2017-12-29 DIAGNOSIS — Z951 Presence of aortocoronary bypass graft: Secondary | ICD-10-CM

## 2017-12-29 DIAGNOSIS — Z79899 Other long term (current) drug therapy: Secondary | ICD-10-CM

## 2017-12-29 MED ORDER — HYDROCHLOROTHIAZIDE 12.5 MG PO CAPS: 12.5000 mg | ORAL_CAPSULE | ORAL | 3 refills | Status: DC | PRN

## 2017-12-29 MED ORDER — AMIODARONE HCL 200 MG PO TABS: ORAL_TABLET | ORAL | 1 refills | Status: DC

## 2017-12-29 NOTE — Patient Instructions (Signed)
Medication Instructions:  DECREASE amiodarone to 200 mg daily x 1 month Then decrease to 100 mg daily x 1 month Then discontinue  Start HCTZ 12.5 mg daily x 1 week Then take as needed for swelling  If you need a refill on your cardiac medications before your next appointment, please call your pharmacy.   Lab work: Please return for FASTING labs next week (CMET, CBC, Lipid, TSH)  Our in office lab hours are Monday-Friday 8:00-4:00, closed for lunch 12:45-1:45 pm.  No appointment needed.  If you have labs (blood work) drawn today and your tests are completely normal, you will receive your results only by: Marland Kitchen MyChart Message (if you have MyChart) OR . A paper copy in the mail If you have any lab test that is abnormal or we need to change your treatment, we will call you to review the results.  Follow-Up: At [facility], you and your health needs are our priority.  As part of our continuing mission to provide you with exceptional heart care, we have created designated Provider Care Teams.  These Care Teams include your primary Cardiologist (physician) and Advanced Practice Providers (APPs -  Physician Assistants and Nurse Practitioners) who all work together to provide you with the care you need, when you need it. You will need a follow up appointment in 3 months.  Please call our office 2 months in advance to schedule this appointment.  You may see Shelva Majestic, MD or one of the following Advanced Practice Providers on your designated Care Team: [place], [place] . Fabian Sharp, PA-C

## 2017-12-29 NOTE — Progress Notes (Addendum)
Cardiology Office Note    Date:  12/31/2017   ID:  COLVIN BLATT, DOB 1945-10-02, MRN [id number]  PCP:  Hulan Fess, MD  Cardiologist:  Shelva Majestic, MD    History of Present Illness:  Carl [name] is a 72 y.o. male who presents for the office today in follow-up cardiology evaluation following his  Recent CABG surgery.   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  In 2009 he developed transient numbness of his right face which lasted for several minutes and at that time apparently underwent evaluation and had a normal MRI, MRA, and carotid duplex studies.  He was found to have only mild to moderate plaque in the proximal right internal carotid artery without stenosis.  I had seen him in March 2016 for preoperative cardiology clearance prior to undergoing left knee replacement surgery by Dr. Durward Fortes.  Preoperative ECG suggested possible junctional rhythm which was new from an ECG of 2012 which previously had only shown sinus bradycardia.  He was asymptomatic.  When I saw him, his ECG showed sinus rhythm at 61 bpm with normal intervals and on that ECG P waves were normal and upright inferiorly.  [place]ord has remained fairly active.  However, recently he had not been exercising as much as he had in the past.  He works out with a trainer 3 days/week.  He denies associated chest pain or palpitations.  He recently has noticed more shortness of breath with walking up steps.  He was recently at a friend's house and was told that he appeared to be more short of breath than previously.  I saw him for evaluation of his exertional shortness of breath on October 24, 2017.  At that time, I recommended that he undergo a 2D echo Doppler study and scheduled him for coronary CT angiography.  His echo Doppler study demonstrated hyperdynamic LV function with an EF of 65 to 70%.  Doppler parameters suggest grade 2 diastolic dysfunction and elevated  ventricular end-diastolic filling pressure.  He had mild MR, mild TR, and mild dilation of his left atrium.  Pulmonary pressures were normal.  CT coronary angiography was performed on November 09, 2017.  This was abnormal and demonstrated an elevated calcium score a 25 which is 78th percentile for age and sex.  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  There was greater than 75% calcific plaque in his proximal and mid diagonal 1 vessel.  The circumflex had 50% proximal plaque.  There was 50 to 75% mixed plaque in the OM 2 vessel less than 50% in the OM1 vessel.  His RCA had 50% calcified plaque proximally, 50 to 75% calcified plaque in the mid vessel.  There was mild aortic root dilatation at 4.1 cm.  His CT images were referred for [name] analysis which were positive in the mid RCA 0.78, the mid LAD 0.78 and in the AV groove circumflex at 0.71.   I saw him in follow-up of the above studies and recommended cardiac catheterization.  Catheterization was done in November 15, 2017 which showed severe multivessel CAD with 85% ostial LAD stenosis, diffuse 75% mid LAD stenosis, total occlusion of the mid AV groove circumflex after the takeoff of the second obtuse marginal vessel with 50% diffuse stenosis in the first large marginal branch, 80 and 90% stenoses  in the second obtuse marginal vessel and extensive collateralization to the distal circumflex and third marginal via the LAD.  He had 1650% mid RCA stenoses and a dominant RCA.  Of note, he had probable high right radial artery takeoff with spasm/coarse stenosis above the elbow and there was retrograde filling of the brachial artery and the catheterization was transition to the femoral approach.  He underwent successful CABG revascularization surgery the following day by Dr. Roxy Horseman on November 16, 2017 with a LIMA graft to his LAD, SVG to first  marginal and distal circumflex, and SVG to the PDA of his RCA with left thigh and calf endoscopic vein harvest.  He developed atrial fibrillation with RVR on postop day 2 and was started on IV amiodarone.  He ultimately converted back to sinus rhythm but developed recurrent AF on postop day 4 on metoprolol and amiodarone and ultimately converted back to sinus rhythm.  He was started on Eliquis on day 5 for anticoagulation.  He was discharged on November 22, 2017.  Approximately 10 days later he started to develop left lower extremity swelling and redness with pain in the back of his calf.  He was evaluated by Ellwood Handler, PA-C at [facility] on December 04, 2017.  There was no sign of infection.  Venous duplex imaging did not reveal any DVT.  He saw Dr. Servando Snare for initial follow-up evaluation on December 14, 2017 at which time he was maintaining sinus rhythm.  He will be participating in cardiac rehab and his orientation is scheduled for February 15, 2018.  Presently, Carl [name] denies any recurrent chest pain.  His breathing has improved with walking.  He went to the beach and was walking at least a mile per day.  He is sleeping better and snoring is less.  He presents for follow-up in the office for evaluation.   Past Medical History:  Diagnosis Date  . Anxiety    takes Xanax daily as needed  . Arthritis   . Cataract    removed both eyes  . Depression    takes Celexa daily  . Diverticulitis   . Diverticulosis   . Enlarged prostate    sightly  . Esophagitis   . Gastric ulcer   . GERD (gastroesophageal reflux disease)    takes Protonix daily  . Hx of adenomatous colonic polyps    benign  . Hyperlipidemia    takes Zocor daily  . Insomnia    takes Ambien nightly  . Joint pain   . Joint swelling     Past Surgical History:  Procedure Laterality Date  . cartliage removed from nose  38yr ago  . cataract surgery Bilateral   . CERVICAL SPINE SURGERY    . COLONOSCOPY    . CORONARY ARTERY BYPASS GRAFT N/A  11/16/2017   Procedure: CORONARY ARTERY BYPASS GRAFTING (CABG) x4. LEFT ENDOSCOPIC SAPHENOUS VEIN HARVEST AND MAMMARY ARTERY TAKE DOWN. LIMA TO LAD, SVG TO PDA, SVG TO DISTAL CIRC & OMI.;  Surgeon: GGrace Isaac MD;  [facility]  Service: Open Heart Surgery;  Laterality: N/A;  . FOREIGN BODY REMOVAL ESOPHAGEAL    . KNEE SURGERY Left    couple of times  . LEFT HEART CATH AND CORONARY ANGIOGRAPHY N/A 11/15/2017   Procedure: LEFT HEART CATH AND CORONARY ANGIOGRAPHY;  Surgeon: KTroy Sine MD;  [facility];  Service: Cardiovascular;  Laterality: N/A;  . NASAL SINUS SURGERY    . POLYPECTOMY    . TEE WITHOUT  CARDIOVERSION N/A 11/16/2017   Procedure: TRANSESOPHAGEAL ECHOCARDIOGRAM (TEE);  Surgeon: [name], MD;  Location: [facility];  Service: Open Heart Surgery;  Laterality: N/A;  . TONSILLECTOMY    . TOTAL KNEE ARTHROPLASTY Left 06/17/2014   Procedure: TOTAL KNEE ARTHROPLASTY;  Surgeon: Garald Balding, MD;  Location: [facility];  Service: Orthopedics;  Laterality: Left;    Current Medications: Outpatient Medications Prior to Visit  Medication Sig Dispense Refill  . acetaminophen (TYLENOL) 325 MG tablet Take 2 tablets (650 mg total) by mouth every 6 (six) hours as needed for mild pain.    Marland Kitchen acyclovir (ZOVIRAX) 400 MG tablet Take 400 mg by mouth 2 (two) times daily.    Marland Kitchen apixaban (ELIQUIS) 5 MG TABS tablet Take 1 tablet (5 mg total) by mouth 2 (two) times daily. 60 tablet 1  . aspirin EC 81 MG tablet Take 1 tablet (81 mg total) by mouth daily. 90 tablet 3  . citalopram (CELEXA) 20 MG tablet Take 20 mg by mouth daily.    . Collagen-Boron-Hyaluronic Acid ([employer] JOINT HEALTH TRIPLE ACTION PO) Take 1 tablet by mouth daily.    . Magnesium Carbonate POWD Take 4 g by mouth daily. Natural Vitality Calm    . metoprolol tartrate (LOPRESSOR) 25 MG tablet Take 1 tablet (25 mg total) by mouth 2 (two) times daily. 60 tablet 1  . Multiple Vitamins-Minerals (PRESERVISION AREDS 2  PO) Take 1 tablet by mouth 2 (two) times daily.    . pantoprazole (PROTONIX) 20 MG tablet TAKE 2 TABLETS(40 MG) BY MOUTH DAILY (Patient taking differently: Take 40 mg by mouth daily. TAKE 2 TABLETS(40 MG) BY MOUTH DAILY) 8 tablet 0  . rosuvastatin (CRESTOR) 40 MG tablet Take 1 tablet (40 mg total) by mouth daily at 6 PM. 30 tablet 1  . zolpidem (AMBIEN) 10 MG tablet Take 10 mg by mouth at bedtime.     Marland Kitchen amiodarone (PACERONE) 200 MG tablet Take 1 tablet (200 mg total) by mouth 2 (two) times daily. 60 tablet 1   No facility-administered medications prior to visit.      Allergies:   Patient has no known allergies.   Social History   Socioeconomic History  . Marital status: Single    Spouse name: Not on file  . Number of children: 3  . Years of education: Not on file  . Highest education level: Not on file  Occupational History  . Occupation: Financial controller  Social Needs  . Financial resource strain: Not on file  . Food insecurity:    Worry: Not on file    Inability: Not on file  . Transportation needs:    Medical: Not on file    Non-medical: Not on file  Tobacco Use  . Smoking status: Never Smoker  . Smokeless tobacco: Never Used  Substance and Sexual Activity  . Alcohol use: Yes    Alcohol/week: 10.0 standard drinks    Types: 10 Standard drinks or equivalent per week    Comment: 2 drinks 5 nights per week  . Drug use: No  . Sexual activity: Never  Lifestyle  . Physical activity:    Days per week: Not on file    Minutes per session: Not on file  . Stress: Not on file  Relationships  . Social connections:    Talks on phone: Not on file    Gets together: Not on file    Attends religious service: Not on file    Active member of club or organization:  Not on file    Attends meetings of clubs or organizations: Not on file    Relationship status: Not on file  Other Topics Concern  . Not on file  Social History Narrative  . Not on file    Additional social history is notable in  that he is divorced x2.  He has 3 children and his oldest son lives in [place].  His second son currently resides in [place] and was just married in [place].  His daughter works in [place] as a Government social research officer in a Web designer.  There is no tobacco use.  He drinks alcohol.  Family History:  The patient's family history includes Colon cancer in his paternal grandfather.  Mother died at age 64 with emphysema.  His father died at 51.  He has 2 sisters ages 27 and 43.  ROS General: Negative; No fevers, chills, or night sweats;  HEENT: Decreased hearing with hearing aid in right ear, no sinus congestion, difficulty swallowing Pulmonary: Negative; No cough, wheezing, shortness of breath, hemoptysis Cardiovascular: Negative; No chest pain, presyncope, syncope, palpitations GI: History of diverticular disease and colonic polyps. GU: Remote history of genital herpes Musculoskeletal: Negative; no myalgias, joint pain, or weakness Hematologic/Oncology: Negative; no easy bruising, bleeding Endocrine: Negative; no heat/cold intolerance; no diabetes Neuro: Negative; no changes in balance, headaches Skin: Negative; No rashes or skin lesions Psychiatric:  Sleep: Negative; No snoring, daytime sleepiness, hypersomnolence, bruxism, restless legs, hypnogognic hallucinations, no cataplexy Other comprehensive 14 point system review is negative.   PHYSICAL EXAM:   VS:  BP 138/66   Pulse (!) 49   Ht 5' 8"  (1.727 m)   Wt 190 lb 12.8 oz (86.5 kg)   BMI 29.01 kg/m     Repeat blood pressure by me today was 120/70 , Wt Readings from Last 3 Encounters:  12/29/17 190 lb 12.8 oz (86.5 kg)  12/14/17 190 lb 12.8 oz (86.5 kg)  12/04/17 187 lb (84.8 kg)    General: Alert, oriented, no distress.  Skin: normal turgor, no rashes, warm and dry HEENT: Normocephalic, atraumatic. Pupils equal round and reactive to light; sclera anicteric; extraocular muscles intact;  Nose without nasal septal  hypertrophy Mouth/Parynx benign; Mallinpatti scale 3 Neck: No JVD, no carotid bruits; normal carotid upstroke Lungs: clear to ausculatation and percussion; no wheezing or rales Chest wall: without tenderness to palpitation Heart: PMI not displaced, RRR, s1 s2 normal, 1/6 systolic murmur, no diastolic murmur, no rubs, gallops, thrills, or heaves Abdomen: soft, nontender; no hepatosplenomehaly, BS+; abdominal aorta nontender and not dilated by palpation. Back: no CVA tenderness Pulses 2+ Musculoskeletal: full range of motion, normal strength, no joint deformities Extremities: Lower extremity has trace to 1+ edema which has improved from the time he was evaluated at [facility] post surgery;  no clubbing cyanosis or edema, Homan's sign negative  Neurologic: grossly nonfocal; Cranial nerves grossly wnl Psychologic: Normal mood and affect   Studies/Labs Reviewed:   EKG:  EKG is ordered today.  ECG (independently read by me): Sinus bradycardia at 49 bpm.  Nonspecific ST changes.  QTc interval 464 ms.  No ectopy.  ECG (independently read by me): Sinus rhythm at 64 bpm.  Right bundle branch block with repolarization changes.  No ectopy.  October 24, 2017 ECG (independently read by me): Normal sinus rhythm at 62 bpm.  Right bundle branch block with repolarization changes.  Prior ECG from April 18, 2014 revealed normal sinus rhythm.  Right bundle branch block was not present.  Recent Labs: BMP Latest Ref Rng & Units 11/20/2017 11/19/2017 11/18/2017  Glucose 70 - 99 mg/dL 99 113(H) 122(H)  BUN 8 - 23 mg/dL 15 23 23   Creatinine 0.61 - 1.24 mg/dL 0.84 1.03 1.23  BUN/Creat Ratio 10 - 24 - - -  Sodium 135 - 145 mmol/L 134(L) 134(L) 135  Potassium 3.5 - 5.1 mmol/L 3.8 4.0 4.4  Chloride 98 - 111 mmol/L 101 102 102  CO2 22 - 32 mmol/L 24 20(L) 23  Calcium 8.9 - 10.3 mg/dL 8.5(L) 8.4(L) 8.5(L)     Hepatic Function Latest Ref Rng & Units 11/18/2017 11/15/2017 11/14/2017  Total Protein 6.5 - 8.1 g/dL  5.7(L) 6.9 6.7  Albumin 3.5 - 5.0 g/dL 3.4(L) 3.9 4.4  AST 15 - 41 U/L 29 33 37  ALT 0 - 44 U/L 19 36 36  Alk Phosphatase 38 - 126 U/L 44 70 85  Total Bilirubin 0.3 - 1.2 mg/dL 0.8 0.7 0.3  Bilirubin, Direct 0.0 - 0.2 mg/dL 0.2 - -    CBC Latest Ref Rng & Units 11/20/2017 11/19/2017 11/18/2017  WBC 4.0 - 10.5 K/uL 12.8(H) 13.1(H) 15.3(H)  Hemoglobin 13.0 - 17.0 g/dL 10.1(L) 10.2(L) 10.3(L)  Hematocrit 39.0 - 52.0 % 31.2(L) 31.7(L) 32.6(L)  Platelets 150 - 400 K/uL 240 193 190   Lab Results  Component Value Date   MCV 101.0 (H) 11/20/2017   MCV 101.0 (H) 11/19/2017   MCV 101.9 (H) 11/18/2017   Lab Results  Component Value Date   TSH 7.798 (H) 11/18/2017   Lab Results  Component Value Date   HGBA1C 5.4 11/15/2017     BNP No results found for: BNP  ProBNP No results found for: PROBNP   Lipid Panel     Component Value Date/Time   CHOL 170 11/14/2017 0841   TRIG 231 (H) 11/14/2017 0841   HDL 48 11/14/2017 0841   CHOLHDL 3.5 11/14/2017 0841   CHOLHDL 3.4 01/29/2007 0410   VLDL 17 01/29/2007 0410   LDLCALC 76 11/14/2017 0841     RADIOLOGY: Dg Chest 2 View  Result Date: 12/14/2017 CLINICAL DATA:  Hx CABG 11/16/2017, no complaints, no smoke EXAM: CHEST - 2 VIEW COMPARISON:  11/20/2017 FINDINGS: Median sternotomy and CABG. The heart is mildly enlarged and stable in configuration. There is persistent small LEFT pleural effusion and LEFT basilar atelectasis. However aeration at the lung bases has improved significantly since the prior study. No pulmonary edema. Previous cervical fusion. Thoracic spondylosis. Numerous old LEFT-sided rib fractures. IMPRESSION: Significantly improved aeration. Small residual LEFT pleural effusion and atelectasis. Electronically Signed   By: Nolon Nations M.D.   On: 12/14/2017 14:14   Vas [place] Lower Extremity Venous (dvt)  Result Date: 12/04/2017  Lower Venous Study Indications: Swelling.  Performing Technologist: Oliver Hum, J   Examination Guidelines: A complete evaluation includes B-mode imaging, spectral Doppler, color Doppler, and power Doppler as needed of all accessible portions of each vessel. Bilateral testing is considered an integral part of a complete examination. Limited examinations for reoccurring indications may be performed as noted.  Right Venous Findings: +---+---------------+---------+-----------+----------+-------+    CompressibilityPhasicitySpontaneityPropertiesSummary +---+---------------+---------+-----------+----------+-------+ CFVFull           Yes      Yes                          +---+---------------+---------+-----------+----------+-------+  Left Venous Findings: +---------+---------------+---------+-----------+----------+-------+          CompressibilityPhasicitySpontaneityPropertiesSummary +---------+---------------+---------+-----------+----------+-------+ CFV  Full           Yes      Yes                          +---------+---------------+---------+-----------+----------+-------+ SFJ      Full                                                 +---------+---------------+---------+-----------+----------+-------+ FV Prox  Full                                                 +---------+---------------+---------+-----------+----------+-------+ FV Mid   Full                                                 +---------+---------------+---------+-----------+----------+-------+ FV DistalFull                                                 +---------+---------------+---------+-----------+----------+-------+ PFV      Full                                                 +---------+---------------+---------+-----------+----------+-------+ POP      Full           Yes      Yes                          +---------+---------------+---------+-----------+----------+-------+ PTV      Full                                                  +---------+---------------+---------+-----------+----------+-------+ PERO     Full                                                 +---------+---------------+---------+-----------+----------+-------+    Summary: Right: No evidence of common femoral vein obstruction. Left: There is no evidence of deep vein thrombosis in the lower extremity. No cystic structure found in the popliteal fossa.  *See table(s) above for measurements and observations. Electronically signed by Deitra Mayo MD on 12/04/2017 at 5:22:15 PM.    Final      Additional studies/ records that were reviewed today include:  I reviewed his previous evaluation with me from April 18, 2014.   ASSESSMENT:    1. CAD in native artery   2. S/P CABG x 4   3. PAF (paroxysmal atrial fibrillation) ([place])   4. Anticoagulated   5. Pure hypercholesterolemia   6. Essential hypertension   7.  TSH elevation   8. Medication management   9. Erectile dysfunction, unspecified erectile dysfunction type     PLAN:  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  He has a history of mild hyperlipidemia and had been on simvastatin 40 mg daily and has been followed by Dr. Hulan Fess.  He denied any history of chest tightness or pressure.  However recently he had noticed slight increase in shortness of breath particularly with walking up steps.  When I saw him for initial evaluation and echo Doppler study revealed hyperdynamic LV function with an EF of 65 to 70% but with grade 2 diastolic dysfunction and abnormal tissue Doppler.  His CT coronary angiogram was suggestive of significant multivessel CAD and was FFR positive.  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 Roxy Horseman.  His postoperative course was complicated by paroxysmal atrial fibrillation for which he was  started on amiodarone and ultimately was discharged on Eliquis.  At present he has still been on amiodarone 200 mg twice a day, metoprolol tartrate 25 mg twice a day.  His ECG today shows sinus bradycardia and he is now 5 weeks status post CABG revascularization.  I have recommended he decrease amiodarone to 200 mg daily for 1 month and then he will reduce this to 100 mg for 1 month with ultimate discontinuance.  I will see him in 2 months when he would have just discontinued amiodarone and if he is maintaining sinus rhythm Eliquis will be discontinued at that time.  During his hospitalization I changed him to rosuvastatin 40 mg for more aggressive lipid-lowering since he had developed significant multivessel CAD on simvastatin despite an LDL cholesterol of 76.  I have given him a prescription for HCTZ 12.5 mg for 1 week in light of his residual left lower extremity edema and then he can change this to as needed.  His TSH was minimally increased.  I will repeat fasting lipid panel, chemistry, TSH and CBC level on his current therapy.  He has had issues with erectile dysfunction previously had been on sialoliths as needed.  He has  requested I check a testosterone level as well.  I will see him in 2 months for follow-up evaluation.   Medication Adjustments/Labs and Tests Ordered: Current medicines are reviewed at length with the patient today.  Concerns regarding medicines are outlined above.  Medication changes, Labs and Tests ordered today are listed in the Patient Instructions below. Patient Instructions  Medication Instructions:  DECREASE amiodarone to 200 mg daily x 1 month Then decrease to 100 mg daily x 1 month Then discontinue  Start HCTZ 12.5 mg daily x 1 week Then take as needed for swelling  If you need a refill on your cardiac medications before your next appointment, please call your pharmacy.   Lab work: Please return for FASTING labs next week (CMET, CBC, Lipid, TSH)  Our in office  lab hours are Monday-Friday 8:00-4:00, closed for lunch 12:45-1:45 pm.  No appointment needed.  If you have labs (blood work) drawn today and your tests are completely normal, you will receive your results only by: Marland Kitchen MyChart Message (if you have MyChart) OR . A paper copy in the mail If you have any lab test that is abnormal or we need to change your treatment, we will call you to review  the results.  Follow-Up: At [facility], you and your health needs are our priority.  As part of our continuing mission to provide you with exceptional heart care, we have created designated Provider Care Teams.  These Care Teams include your primary Cardiologist (physician) and Advanced Practice Providers (APPs -  Physician Assistants and Nurse Practitioners) who all work together to provide you with the care you need, when you need it. You will need a follow up appointment in 3 months.  Please call our office 2 months in advance to schedule this appointment.  You may see Shelva Majestic, MD or one of the following Advanced Practice Providers on your designated Care Team: [place], [place] . Fabian Sharp, PA-C      Signed, Shelva Majestic, MD  12/31/2017 10:37 AM    [facility] [address] [place], [place], [place]  [address] Phone: [phone]

## 2017-12-31 ENCOUNTER — Encounter: Payer: Self-pay | Admitting: Cardiovascular Disease

## 2017-12-31 NOTE — Addendum Note (Signed)
Addended by: Shelva Majestic A on: 12/31/2017 10:22 AM   Modules accepted: Level of Service

## 2018-01-02 DIAGNOSIS — Z951 Presence of aortocoronary bypass graft: Secondary | ICD-10-CM | POA: Diagnosis not present

## 2018-01-02 DIAGNOSIS — E7800 Pure hypercholesterolemia, unspecified: Secondary | ICD-10-CM | POA: Diagnosis not present

## 2018-01-02 DIAGNOSIS — I10 Essential (primary) hypertension: Secondary | ICD-10-CM | POA: Diagnosis not present

## 2018-01-02 DIAGNOSIS — I2510 Atherosclerotic heart disease of native coronary artery without angina pectoris: Secondary | ICD-10-CM | POA: Diagnosis not present

## 2018-01-02 DIAGNOSIS — N529 Male erectile dysfunction, unspecified: Secondary | ICD-10-CM | POA: Diagnosis not present

## 2018-01-02 DIAGNOSIS — R7989 Other specified abnormal findings of blood chemistry: Secondary | ICD-10-CM | POA: Diagnosis not present

## 2018-01-02 DIAGNOSIS — Z79899 Other long term (current) drug therapy: Secondary | ICD-10-CM | POA: Diagnosis not present

## 2018-01-03 LAB — CBC
Hematocrit: 40.1 % (ref 37.5–51.0)
Hemoglobin: 13.3 g/dL (ref 13.0–17.7)
MCH: 32.4 pg (ref 26.6–33.0)
MCHC: 33.2 g/dL (ref 31.5–35.7)
MCV: 98 fL — ABNORMAL HIGH (ref 79–97)
Platelets: 250 x10E3/uL (ref 150–450)
RBC: 4.1 x10E6/uL — ABNORMAL LOW (ref 4.14–5.80)
RDW: 13.5 % (ref 12.3–15.4)
WBC: 6.2 x10E3/uL (ref 3.4–10.8)

## 2018-01-03 LAB — TSH: TSH: 13.94 u[IU]/mL — AB (ref 0.450–4.500)

## 2018-01-03 LAB — COMPREHENSIVE METABOLIC PANEL WITH GFR
ALT: 57 IU/L — ABNORMAL HIGH (ref 0–44)
AST: 46 IU/L — ABNORMAL HIGH (ref 0–40)
Albumin/Globulin Ratio: 1.9 (ref 1.2–2.2)
Albumin: 4.1 g/dL (ref 3.5–4.8)
Alkaline Phosphatase: 139 IU/L — ABNORMAL HIGH (ref 39–117)
BUN/Creatinine Ratio: 11 (ref 10–24)
BUN: 10 mg/dL (ref 8–27)
Bilirubin Total: 0.4 mg/dL (ref 0.0–1.2)
CO2: 23 mmol/L (ref 20–29)
Calcium: 9.2 mg/dL (ref 8.6–10.2)
Chloride: 99 mmol/L (ref 96–106)
Creatinine, Ser: 0.91 mg/dL (ref 0.76–1.27)
GFR calc Af Amer: 96 mL/min/1.73
GFR calc non Af Amer: 83 mL/min/1.73
Globulin, Total: 2.2 g/dL (ref 1.5–4.5)
Glucose: 100 mg/dL — ABNORMAL HIGH (ref 65–99)
Potassium: 5 mmol/L (ref 3.5–5.2)
Sodium: 137 mmol/L (ref 134–144)
Total Protein: 6.3 g/dL (ref 6.0–8.5)

## 2018-01-03 LAB — LIPID PANEL
Chol/HDL Ratio: 2.4 ratio (ref 0.0–5.0)
Cholesterol, Total: 107 mg/dL (ref 100–199)
HDL: 44 mg/dL
LDL Calculated: 39 mg/dL (ref 0–99)
Triglycerides: 120 mg/dL (ref 0–149)
VLDL Cholesterol Cal: 24 mg/dL (ref 5–40)

## 2018-01-03 LAB — TESTOSTERONE: Testosterone: 643 ng/dL (ref 264–916)

## 2018-01-10 ENCOUNTER — Other Ambulatory Visit: Payer: Self-pay | Admitting: *Deleted

## 2018-01-10 DIAGNOSIS — R7989 Other specified abnormal findings of blood chemistry: Secondary | ICD-10-CM

## 2018-01-10 DIAGNOSIS — R945 Abnormal results of liver function studies: Secondary | ICD-10-CM

## 2018-01-10 DIAGNOSIS — Z79899 Other long term (current) drug therapy: Secondary | ICD-10-CM

## 2018-01-10 MED ORDER — ROSUVASTATIN CALCIUM 40 MG PO TABS: 20.0000 mg | ORAL_TABLET | Freq: Every day | ORAL | 1 refills | Status: DC

## 2018-01-17 ENCOUNTER — Other Ambulatory Visit: Payer: Self-pay | Admitting: *Deleted

## 2018-01-17 MED ORDER — LEVOTHYROXINE SODIUM 50 MCG PO TABS: 50.0000 ug | ORAL_TABLET | Freq: Every day | ORAL | 3 refills | Status: DC

## 2018-01-18 ENCOUNTER — Encounter: Payer: Self-pay | Admitting: Cardiothoracic Surgery

## 2018-01-18 ENCOUNTER — Ambulatory Visit (INDEPENDENT_AMBULATORY_CARE_PROVIDER_SITE_OTHER): Payer: Self-pay | Admitting: Cardiothoracic Surgery

## 2018-01-18 ENCOUNTER — Other Ambulatory Visit: Payer: Self-pay

## 2018-01-18 VITALS — BP 123/86 | HR 55 | Resp 18 | Ht 68.0 in | Wt 184.0 lb

## 2018-01-18 DIAGNOSIS — I2510 Atherosclerotic heart disease of native coronary artery without angina pectoris: Secondary | ICD-10-CM

## 2018-01-18 DIAGNOSIS — Z951 Presence of aortocoronary bypass graft: Secondary | ICD-10-CM

## 2018-01-18 NOTE — Progress Notes (Signed)
[address]       [place] [address]             [phone]      Carl [name] Medical Record #[id number] Date of Birth: [birth year]  Referring: Troy Sine, MD Primary Care: Hulan Fess, MD Primary Cardiologist: Shelva Majestic, MD   Chief Complaint:   POST OP FOLLOW UP 11/20/2017 PREOPERATIVE DIAGNOSIS:  Coronary occlusive disease with unstable angina. POSTOPERATIVE DIAGNOSIS:  Coronary occlusive disease with unstable angina. SURGICAL PROCEDURES:  Coronary artery bypass grafting x4 with left internal mammary to the left anterior descending coronary artery, reverse saphenous vein graft to the first obtuse marginal and distal circumflex, reverse saphenous vein graft to the  posterior descending coronary artery with left thigh and calf endoscopic vein harvest. SURGEON:  Lanelle Bal, MD  History of Present Illness:     Patient returns to the office today in follow-up after his recent urgent coronary artery bypass grafting on 11/20/2017.  He is making good progress postoperatively.  He has remained active.  Spent 2 weeks at [facility]. Postoperatively he had a significant amount of atrial fibrillation and ultimately was discharged home on Pacerone and Eliquis, he continues on both of these  He is enrolled in cardiac rehab but will not start due to availability of schedule until January 9    Past Medical History:  Diagnosis Date  . Anxiety    takes Xanax daily as needed  . Arthritis   . Cataract    removed both eyes  . Depression    takes Celexa daily  . Diverticulitis   . Diverticulosis   . Enlarged prostate    sightly  . Esophagitis   . Gastric ulcer   . GERD (gastroesophageal reflux disease)    takes Protonix daily  . Hx of adenomatous colonic polyps    benign  . Hyperlipidemia    takes Zocor daily  . Insomnia    takes Ambien nightly  . Joint pain   . Joint swelling      Social History   Tobacco Use  Smoking Status  Never Smoker  Smokeless Tobacco Never Used    Social History   Substance and Sexual Activity  Alcohol Use Yes  . Alcohol/week: 10.0 standard drinks  . Types: 10 Standard drinks or equivalent per week   Comment: 2 drinks 5 nights per week     No Known Allergies  Current Outpatient Medications  Medication Sig Dispense Refill  . acetaminophen (TYLENOL) 325 MG tablet Take 2 tablets (650 mg total) by mouth every 6 (six) hours as needed for mild pain.    Marland Kitchen acyclovir (ZOVIRAX) 400 MG tablet Take 400 mg by mouth 2 (two) times daily.    Marland Kitchen amiodarone (PACERONE) 200 MG tablet Take 200 mg daily x 1 month, then decrease to 100 mg daily x 1 month, then discontinue 60 tablet 1  . apixaban (ELIQUIS) 5 MG TABS tablet Take 1 tablet (5 mg total) by mouth 2 (two) times daily. 60 tablet 1  . aspirin EC 81 MG tablet Take 1 tablet (81 mg total) by mouth daily. 90 tablet 3  . citalopram (CELEXA) 20 MG tablet Take 20 mg by mouth daily.    . Collagen-Boron-Hyaluronic Acid ([employer] JOINT HEALTH TRIPLE ACTION PO) Take 1 tablet by mouth daily.    . hydrochlorothiazide (MICROZIDE) 12.5 MG capsule Take 1 capsule (12.5 mg total) by mouth as needed (swelling). 30 capsule 3  .  levothyroxine (SYNTHROID) 50 MCG tablet Take 1 tablet (50 mcg total) by mouth daily before breakfast. 30 tablet 3  . Magnesium Carbonate POWD Take 4 g by mouth daily. Natural Vitality Calm    . metoprolol tartrate (LOPRESSOR) 25 MG tablet Take 1 tablet (25 mg total) by mouth 2 (two) times daily. 60 tablet 1  . Multiple Vitamins-Minerals (PRESERVISION AREDS 2 PO) Take 1 tablet by mouth 2 (two) times daily.    . pantoprazole (PROTONIX) 20 MG tablet TAKE 2 TABLETS(40 MG) BY MOUTH DAILY (Patient taking differently: Take 40 mg by mouth daily. TAKE 2 TABLETS(40 MG) BY MOUTH DAILY) 8 tablet 0  . rosuvastatin (CRESTOR) 40 MG tablet Take 0.5 tablets (20 mg total) by mouth daily at 6 PM. 15 tablet 1  . zolpidem (AMBIEN) 10 MG tablet Take 10 mg by mouth at  bedtime.      No current facility-administered medications for this visit.        Physical Exam: BP 123/86 (BP Location: Right Arm, Patient Position: Sitting, Cuff Size: Normal)   Pulse (!) 55   Resp 18   Ht 5\' 8"  (1.727 m)   Wt 184 lb (83.5 kg)   SpO2 97% Comment: RA  BMI 27.98 kg/m   General appearance: alert and cooperative Head: Normocephalic, without obvious abnormality, atraumatic Neck: no adenopathy, no carotid bruit, no JVD, supple, symmetrical, trachea midline and thyroid not enlarged, symmetric, no tenderness/mass/nodules Lymph nodes: Cervical, supraclavicular, and axillary nodes normal. Resp: clear to auscultation bilaterally Back: symmetric, no curvature. ROM normal. No CVA tenderness. Cardio: regular rate and rhythm, S1, S2 normal, no murmur, click, rub or gallop GI: soft, non-tender; bowel sounds normal; no masses,  no organomegaly Extremities: extremities normal, atraumatic, no cyanosis or edema and Homans sign is negative, no sign of DVT Neurologic: Grossly normal  Diagnostic Studies & Laboratory data:     Recent Radiology Findings:   No results found.    Recent Lab Findings: Lab Results  Component Value Date   WBC 6.2 01/02/2018   HGB 13.3 01/02/2018   HCT 40.1 01/02/2018   PLT 250 01/02/2018   GLUCOSE 100 (H) 01/02/2018   CHOL 107 01/02/2018   TRIG 120 01/02/2018   HDL 44 01/02/2018   LDLCALC 39 01/02/2018   ALT 57 (H) 01/02/2018   AST 46 (H) 01/02/2018   NA 137 01/02/2018   K 5.0 01/02/2018   CL 99 01/02/2018   CREATININE 0.91 01/02/2018   BUN 10 01/02/2018   CO2 23 01/02/2018   TSH 13.940 (H) 01/02/2018   INR 1.35 11/16/2017   HGBA1C 5.4 11/15/2017      Assessment / Plan:   Patient 8 weeks following urgent coronary artery bypass grafting, making good progress He continues on amiodarone and Eliquis for early postop atrial fib-of note his TSH is elevated, recently checked by the cardiology office His wounds are healing well, he has  had no recurrent angina or evidence of congestive heart failure He has a follow-up appointment in cardiology in late February I will plan to see him back as needed.   [name] MD      [address] [place] [address] Office [phone]   Beeper [phone]  01/18/2018 12:20 PM

## 2018-02-01 DIAGNOSIS — R7989 Other specified abnormal findings of blood chemistry: Secondary | ICD-10-CM | POA: Diagnosis not present

## 2018-02-01 DIAGNOSIS — Z79899 Other long term (current) drug therapy: Secondary | ICD-10-CM | POA: Diagnosis not present

## 2018-02-01 DIAGNOSIS — R945 Abnormal results of liver function studies: Secondary | ICD-10-CM | POA: Diagnosis not present

## 2018-02-01 LAB — HEPATIC FUNCTION PANEL
ALBUMIN: 3.8 g/dL (ref 3.5–4.8)
ALT: 75 IU/L — ABNORMAL HIGH (ref 0–44)
AST: 62 IU/L — AB (ref 0–40)
Alkaline Phosphatase: 249 IU/L — ABNORMAL HIGH (ref 39–117)
Bilirubin Total: 0.3 mg/dL (ref 0.0–1.2)
Bilirubin, Direct: 0.15 mg/dL (ref 0.00–0.40)
TOTAL PROTEIN: 6.2 g/dL (ref 6.0–8.5)

## 2018-02-02 ENCOUNTER — Other Ambulatory Visit: Payer: Self-pay | Admitting: Cardiovascular Disease

## 2018-02-02 ENCOUNTER — Other Ambulatory Visit: Payer: Self-pay | Admitting: *Deleted

## 2018-02-02 ENCOUNTER — Telehealth: Payer: Self-pay | Admitting: Cardiovascular Disease

## 2018-02-02 DIAGNOSIS — R7989 Other specified abnormal findings of blood chemistry: Secondary | ICD-10-CM

## 2018-02-02 DIAGNOSIS — R945 Abnormal results of liver function studies: Secondary | ICD-10-CM

## 2018-02-02 DIAGNOSIS — Z79899 Other long term (current) drug therapy: Secondary | ICD-10-CM

## 2018-02-02 LAB — TSH: TSH: 19.66 u[IU]/mL — AB (ref 0.450–4.500)

## 2018-02-02 MED ORDER — ROSUVASTATIN CALCIUM 10 MG PO TABS: 10.0000 mg | ORAL_TABLET | Freq: Every day | ORAL | 3 refills | Status: DC

## 2018-02-02 MED ORDER — LEVOTHYROXINE SODIUM 75 MCG PO TABS: 75.0000 ug | ORAL_TABLET | Freq: Every day | ORAL | 0 refills | Status: DC

## 2018-02-02 MED ORDER — ROSUVASTATIN CALCIUM 40 MG PO TABS: ORAL_TABLET | ORAL | 1 refills | Status: DC

## 2018-02-02 NOTE — Telephone Encounter (Signed)
New Message   Pt c/o medication issue:  1. Name of Medication: rosuvastatin (CRESTOR) 40 MG tablet  2. How are you currently taking this medication (dosage and times per day)?  Take 10 mg daily  3. Are you having a reaction (difficulty breathing--STAT)? no  4. What is your medication issue?  Pt states they were speaking with a nurse about some medication changes and had more questions. Also are requesting a list of the medication he takes. Please call

## 2018-02-02 NOTE — Telephone Encounter (Signed)
Spoke to patient, he states he needs a new rx for rosuvastatin.  rx sent for new dose.  Also request med list be mailed to him.  Med list placed in mail

## 2018-02-03 ENCOUNTER — Other Ambulatory Visit: Payer: Self-pay | Admitting: Physician Assistant

## 2018-02-05 ENCOUNTER — Other Ambulatory Visit: Payer: Self-pay | Admitting: *Deleted

## 2018-02-05 ENCOUNTER — Telehealth (HOSPITAL_COMMUNITY): Payer: Self-pay

## 2018-02-05 MED ORDER — METOPROLOL TARTRATE 25 MG PO TABS: 25.0000 mg | ORAL_TABLET | Freq: Two times a day (BID) | ORAL | 3 refills | Status: DC

## 2018-02-05 NOTE — Telephone Encounter (Signed)
Cardiac Rehab Medication Review by a Pharmacist  Does the patient  feel that his/her medications are working for him/her?  yes  Has the patient been experiencing any side effects to the medications prescribed?  no  Does the patient measure his/her own blood pressure or blood glucose at home?  Yes, 130/70   Does the patient have any problems obtaining medications due to transportation or finances?   no  Understanding of regimen: good Understanding of indications: good Potential of compliance: good    Pharmacist comments:  Patient report being out of metoprolol. Pharmacy sent refill request to cardiology.   Isaias Sakai, Sherian Rein D PGY1 Pharmacy Resident  02/05/2018      4:36 PM

## 2018-02-15 ENCOUNTER — Encounter (HOSPITAL_COMMUNITY)
Admission: RE | Admit: 2018-02-15 | Discharge: 2018-02-15 | Disposition: A | Discharge status: Home / Self Care | Payer: PPO | Source: Ambulatory Visit | Attending: Cardiovascular Disease | Admitting: Cardiovascular Disease

## 2018-02-15 ENCOUNTER — Encounter (HOSPITAL_COMMUNITY): Payer: Self-pay

## 2018-02-15 VITALS — BP 122/64 | HR 54 | Ht 68.0 in | Wt 186.1 lb

## 2018-02-15 DIAGNOSIS — Z951 Presence of aortocoronary bypass graft: Secondary | ICD-10-CM | POA: Insufficient documentation

## 2018-02-15 HISTORY — DX: Essential (primary) hypertension: I10

## 2018-02-15 NOTE — Progress Notes (Signed)
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  acetaminophen (TYLENOL) 325 MG tablet, Take 2 tablets (650 mg total) by mouth every 6 (six) hours as needed for mild pain., Disp: , Rfl:  .  acyclovir (ZOVIRAX) 400 MG tablet, Take 400 mg by mouth 2 (two) times daily., Disp: , Rfl:  .  apixaban (ELIQUIS) 5 MG TABS tablet, Take 1 tablet (5 mg total) by mouth 2 (two) times daily., Disp: 60 tablet, Rfl: 1 .  aspirin EC 81 MG tablet, Take 1 tablet (81 mg total) by mouth daily., Disp: 90 tablet, Rfl: 3 .  citalopram (CELEXA) 20 MG tablet, Take 20 mg by mouth daily., Disp: , Rfl:  .  Collagen-Boron-Hyaluronic Acid ([employer] TRIPLE ACTION PO), Take 1 tablet by mouth daily., Disp: , Rfl:  .  hydrochlorothiazide (MICROZIDE) 12.5 MG capsule, Take 1 capsule (12.5 mg total) by mouth as needed (swelling)., Disp: 30 capsule, Rfl: 3 .  levothyroxine (SYNTHROID, LEVOTHROID) 75 MCG tablet, TAKE 1 TABLET(75 MCG) BY MOUTH DAILY BEFORE BREAKFAST, Disp: 90 tablet, Rfl: 0 .  Magnesium Carbonate POWD, Take 4 g by mouth daily. Natural Vitality Calm, Disp: , Rfl:  .  metoprolol tartrate (LOPRESSOR) 25 MG tablet, Take 1 tablet (25 mg total) by mouth 2 (two) times daily., Disp: 180 tablet, Rfl: 3 .  Multiple Vitamins-Minerals (PRESERVISION AREDS 2 PO), Take 1 tablet by mouth 2 (two) times daily., Disp: , Rfl:  .  pantoprazole (PROTONIX) 20 MG tablet, TAKE 2 TABLETS(40 MG) BY MOUTH DAILY (Patient not taking: Reported on  02/05/2018), Disp: 8 tablet, Rfl: 0 .  rosuvastatin (CRESTOR) 10 MG tablet, Take 1 tablet (10 mg total) by mouth daily., Disp: 30 tablet, Rfl: 3 .  zolpidem (AMBIEN) 10 MG tablet, Take 10 mg by mouth at bedtime. , Disp: , Rfl:   Past Medical History: Past Medical History:  Diagnosis Date  . Anxiety    takes Xanax daily as needed  . Arthritis   . Cataract    removed both eyes  . Depression    takes Celexa daily  . Diverticulitis   . Diverticulosis   . Enlarged prostate    sightly  . Esophagitis   . Gastric ulcer   . GERD (gastroesophageal reflux disease)    takes Protonix daily  . Hx of adenomatous colonic polyps    benign  . Hyperlipidemia    takes Zocor daily  . Hypertension   . Insomnia    takes Ambien nightly  . Joint pain   . Joint swelling     Tobacco Use: Social History   Tobacco Use  Smoking Status Never Smoker  Smokeless Tobacco Never Used    Labs: Recent Review Flowsheet Data    Labs for ITP Cardiac and Pulmonary Rehab Latest Ref Rng & Units 11/16/2017 11/16/2017 11/16/2017 11/17/2017 01/02/2018   Cholestrol 100 - 199 mg/dL - - - - 107   LDLCALC 0 - 99 mg/dL - - - -  39   HDL >39 mg/dL - - - - 44   Trlycerides 0 - 149 mg/dL - - - - 120   Hemoglobin A1c 4.8 - 5.6 % - - - - -   PHART 7.350 - 7.450 7.331(L) 7.334(L) - - -   PCO2ART 32.0 - 48.0 mmHg 40.9 38.7 - - -   HCO3 20.0 - 28.0 mmol/L 21.8 20.8 - - -   TCO2 22 - 32 mmol/L 23 22 23 23  -   ACIDBASEDEF 0.0 - 2.0 mmol/L 4.0(H) 5.0(H) - - -   O2SAT % 97.0 98.0 - - -      Capillary Blood Glucose: Lab Results  Component Value Date   GLUCAP 110 (H) 11/22/2017   GLUCAP 103 (H) 11/20/2017   GLUCAP 105 (H) 11/20/2017   GLUCAP 101 (H) 11/20/2017   GLUCAP 97 11/20/2017     Exercise Target Goals: Exercise Program Goal: Individual exercise prescription set using results from initial 6 min walk test and THRR while considering  patient's activity barriers and safety.   Exercise Prescription  Goal: Initial exercise prescription builds to 30-45 minutes a day of aerobic activity, 2-3 days per week.  Home exercise guidelines will be given to patient during program as part of exercise prescription that the participant will acknowledge.  Activity Barriers & Risk Stratification: Activity Barriers & Cardiac Risk Stratification - 02/15/18 1147      Activity Barriers & Cardiac Risk Stratification   Activity Barriers  None    Cardiac Risk Stratification  High       6 Minute Walk: 6 Minute Walk    Row Name 02/15/18 1144         6 Minute Walk   Phase  Initial     Distance  1648 feet     Walk Time  6 minutes     # of Rest Breaks  0     MPH  3.12     METS  3.27     RPE  11     Perceived Dyspnea   0     VO2 Peak  11.45     Symptoms  No     Resting HR  54 bpm     Resting BP  122/64     Resting Oxygen Saturation   98 %     Exercise Oxygen Saturation  during 6 min walk  98 %     Max Ex. HR  98 bpm     Max Ex. BP  128/60     2 Minute Post BP  118/72        Oxygen Initial Assessment:   Oxygen Re-Evaluation:   Oxygen Discharge (Final Oxygen Re-Evaluation):   Initial Exercise Prescription: Initial Exercise Prescription - 02/15/18 1100      Date of Initial Exercise RX and Referring Provider   Date  02/15/18    Referring Provider  Troy Sine MD     Expected Discharge Date  05/25/18      Bike   Level  0.8    Minutes  10    METs  2.78      NuStep   Level  2    SPM  85    Minutes  10    METs  3      Track   Laps  13    Minutes  10    METs  3.3      Prescription Details   Frequency (times per week)  3x  Duration  Progress to 30 minutes of continuous aerobic without signs/symptoms of physical distress      Intensity   THRR 40-80% of Max Heartrate  59-118    Ratings of Perceived Exertion  11-13    Perceived Dyspnea  0-4      Progression   Progression  Continue progressive overload as per policy without signs/symptoms or physical distress.       Resistance Training   Training Prescription  Yes    Weight  3lbs    Reps  10-15       Perform Capillary Blood Glucose checks as needed.  Exercise Prescription Changes:   Exercise Comments:   Exercise Goals and Review:  Exercise Goals    Row Name 02/15/18 1146             Exercise Goals   Increase Physical Activity  Yes       Intervention  Provide advice, education, support and counseling about physical activity/exercise needs.;Develop an individualized exercise prescription for aerobic and resistive training based on initial evaluation findings, risk stratification, comorbidities and participant's personal goals.       Expected Outcomes  Short Term: Attend rehab on a regular basis to increase amount of physical activity.;Long Term: Add in home exercise to make exercise part of routine and to increase amount of physical activity.;Long Term: Exercising regularly at least 3-5 days a week.       Increase Strength and Stamina  Yes       Intervention  Provide advice, education, support and counseling about physical activity/exercise needs.;Develop an individualized exercise prescription for aerobic and resistive training based on initial evaluation findings, risk stratification, comorbidities and participant's personal goals.       Expected Outcomes  Short Term: Increase workloads from initial exercise prescription for resistance, speed, and METs.;Short Term: Perform resistance training exercises routinely during rehab and add in resistance training at home;Long Term: Improve cardiorespiratory fitness, muscular endurance and strength as measured by increased METs and functional capacity (6MWT)       Able to understand and use rate of perceived exertion (RPE) scale  Yes       Intervention  Provide education and explanation on how to use RPE scale       Expected Outcomes  Short Term: Able to use RPE daily in rehab to express subjective intensity level;Long Term:  Able to use RPE to guide  intensity level when exercising independently       Knowledge and understanding of Target Heart Rate Range (THRR)  Yes       Intervention  Provide education and explanation of THRR including how the numbers were predicted and where they are located for reference       Expected Outcomes  Short Term: Able to state/look up THRR;Short Term: Able to use daily as guideline for intensity in rehab;Long Term: Able to use THRR to govern intensity when exercising independently       Able to check pulse independently  Yes       Intervention  Provide education and demonstration on how to check pulse in carotid and radial arteries.;Review the importance of being able to check your own pulse for safety during independent exercise       Expected Outcomes  Short Term: Able to explain why pulse checking is important during independent exercise;Long Term: Able to check pulse independently and accurately       Understanding of Exercise Prescription  Yes       Intervention  Provide education, explanation, and written materials on patient's individual exercise prescription       Expected Outcomes  Short Term: Able to explain program exercise prescription;Long Term: Able to explain home exercise prescription to exercise independently          Exercise Goals Re-Evaluation :   Discharge Exercise Prescription (Final Exercise Prescription Changes):   Nutrition:  Target Goals: Understanding of nutrition guidelines, daily intake of sodium 1500mg , cholesterol 200mg , calories 30% from fat and 7% or less from saturated fats, daily to have 5 or more servings of fruits and vegetables.  Biometrics: Pre Biometrics - 02/15/18 1146      Pre Biometrics   Waist Circumference  41 inches    Hip Circumference  41.5 inches    Waist to Hip Ratio  0.99 %    Triceps Skinfold  10 mm    % Body Fat  26.8 %    Grip Strength  36 kg    Flexibility  13 in    Single Leg Stand  6.64 seconds        Nutrition Therapy Plan and  Nutrition Goals:   Nutrition Assessments:   Nutrition Goals Re-Evaluation:   Nutrition Goals Re-Evaluation:   Nutrition Goals Discharge (Final Nutrition Goals Re-Evaluation):   Psychosocial: Target Goals: Acknowledge presence or absence of significant depression and/or stress, maximize coping skills, provide positive support system. Participant is able to verbalize types and ability to use techniques and skills needed for reducing stress and depression.  Initial Review & Psychosocial Screening: Initial Psych Review & Screening - 02/15/18 1050      Initial Review   Current issues with  None Identified      Family Dynamics   Good Support System?  Yes   Arthor lists his girlfriend as a source of support.     Barriers   Psychosocial barriers to participate in program  There are no identifiable barriers or psychosocial needs.      Screening Interventions   Interventions  Encouraged to exercise       Quality of Life Scores: Quality of Life - 02/15/18 1049      Quality of Life   Select  Quality of Life      Quality of Life Scores   Health/Function Pre  26 %    Socioeconomic Pre  28.93 %    Psych/Spiritual Pre  28.07 %    Family Pre  28.5 %    GLOBAL Pre  27.66 %      Scores of 19 and below usually indicate a poorer quality of life in these areas.  A difference of  2-3 points is a clinically meaningful difference.  A difference of 2-3 points in the total score of the Quality of Life Index has been associated with significant improvement in overall quality of life, self-image, physical symptoms, and general health in studies assessing change in quality of life.  PHQ-9: Recent Review Flowsheet Data    Depression screen [facility] 2/9 12/14/2017   Decreased Interest 0   Down, Depressed, Hopeless 0   PHQ - 2 Score 0     Interpretation of Total Score  Total Score Depression Severity:  1-4 = Minimal depression, 5-9 = Mild depression, 10-14 = Moderate depression, 15-19 =  Moderately severe depression, 20-27 = Severe depression   Psychosocial Evaluation and Intervention:   Psychosocial Re-Evaluation:   Psychosocial Discharge (Final Psychosocial Re-Evaluation):   Vocational Rehabilitation: Provide vocational rehab assistance to qualifying candidates.   Vocational Rehab Evaluation &  Intervention: Vocational Rehab - 02/15/18 1049      Initial Vocational Rehab Evaluation & Intervention   Assessment shows need for Vocational Rehabilitation  No       Education: Education Goals: Education classes will be provided on a weekly basis, covering required topics. Participant will state understanding/return demonstration of topics presented.  Learning Barriers/Preferences: Learning Barriers/Preferences - 02/15/18 1142      Learning Barriers/Preferences   Learning Barriers  Hearing;Sight    Learning Preferences  Verbal Instruction;Written Material;Skilled Demonstration       Education Topics: Count Your Pulse:  -Group instruction provided by verbal instruction, demonstration, patient participation and written materials to support subject.  Instructors address importance of being able to find your pulse and how to count your pulse when at home without a heart monitor.  Patients get hands on experience counting their pulse with staff help and individually.   Heart Attack, Angina, and Risk Factor Modification:  -Group instruction provided by verbal instruction, video, and written materials to support subject.  Instructors address signs and symptoms of angina and heart attacks.    Also discuss risk factors for heart disease and how to make changes to improve heart health risk factors.   Functional Fitness:  -Group instruction provided by verbal instruction, demonstration, patient participation, and written materials to support subject.  Instructors address safety measures for doing things around the house.  Discuss how to get up and down off the floor, how to  pick things up properly, how to safely get out of a chair without assistance, and balance training.   Meditation and Mindfulness:  -Group instruction provided by verbal instruction, patient participation, and written materials to support subject.  Instructor addresses importance of mindfulness and meditation practice to help reduce stress and improve awareness.  Instructor also leads participants through a meditation exercise.    Stretching for Flexibility and Mobility:  -Group instruction provided by verbal instruction, patient participation, and written materials to support subject.  Instructors lead participants through series of stretches that are designed to increase flexibility thus improving mobility.  These stretches are additional exercise for major muscle groups that are typically performed during regular warm up and cool down.   Hands Only CPR:  -Group verbal, video, and participation provides a basic overview of AHA guidelines for community CPR. Role-play of emergencies allow participants the opportunity to practice calling for help and chest compression technique with discussion of AED use.   Hypertension: -Group verbal and written instruction that provides a basic overview of hypertension including the most recent diagnostic guidelines, risk factor reduction with self-care instructions and medication management.    Nutrition I class: Heart Healthy Eating:  -Group instruction provided by PowerPoint slides, verbal discussion, and written materials to support subject matter. The instructor gives an explanation and review of the Therapeutic Lifestyle Changes diet recommendations, which includes a discussion on lipid goals, dietary fat, sodium, fiber, plant stanol/sterol esters, sugar, and the components of a well-balanced, healthy diet.   Nutrition II class: Lifestyle Skills:  -Group instruction provided by PowerPoint slides, verbal discussion, and written materials to support  subject matter. The instructor gives an explanation and review of label reading, grocery shopping for heart health, heart healthy recipe modifications, and ways to make healthier choices when eating out.   Diabetes Question & Answer:  -Group instruction provided by PowerPoint slides, verbal discussion, and written materials to support subject matter. The instructor gives an explanation and review of diabetes co-morbidities, pre- and post-prandial blood glucose goals, pre-exercise  blood glucose goals, signs, symptoms, and treatment of hypoglycemia and hyperglycemia, and foot care basics.   Diabetes Blitz:  -Group instruction provided by PowerPoint slides, verbal discussion, and written materials to support subject matter. The instructor gives an explanation and review of the physiology behind type 1 and type 2 diabetes, diabetes medications and rational behind using different medications, pre- and post-prandial blood glucose recommendations and Hemoglobin A1c goals, diabetes diet, and exercise including blood glucose guidelines for exercising safely.    Portion Distortion:  -Group instruction provided by PowerPoint slides, verbal discussion, written materials, and food models to support subject matter. The instructor gives an explanation of serving size versus portion size, changes in portions sizes over the last 20 years, and what consists of a serving from each food group.   Stress Management:  -Group instruction provided by verbal instruction, video, and written materials to support subject matter.  Instructors review role of stress in heart disease and how to cope with stress positively.     Exercising on Your Own:  -Group instruction provided by verbal instruction, power point, and written materials to support subject.  Instructors discuss benefits of exercise, components of exercise, frequency and intensity of exercise, and end points for exercise.  Also discuss use of nitroglycerin and  activating EMS.  Review options of places to exercise outside of rehab.  Review guidelines for sex with heart disease.   Cardiac Drugs I:  -Group instruction provided by verbal instruction and written materials to support subject.  Instructor reviews cardiac drug classes: antiplatelets, anticoagulants, beta blockers, and statins.  Instructor discusses reasons, side effects, and lifestyle considerations for each drug class.   Cardiac Drugs II:  -Group instruction provided by verbal instruction and written materials to support subject.  Instructor reviews cardiac drug classes: angiotensin converting enzyme inhibitors (ACE-I), angiotensin II receptor blockers (ARBs), nitrates, and calcium channel blockers.  Instructor discusses reasons, side effects, and lifestyle considerations for each drug class.   Anatomy and Physiology of the Circulatory System:  Group verbal and written instruction and models provide basic cardiac anatomy and physiology, with the coronary electrical and arterial systems. Review of: AMI, Angina, Valve disease, Heart Failure, Peripheral Artery Disease, Cardiac Arrhythmia, Pacemakers, and the ICD.   Other Education:  -Group or individual verbal, written, or video instructions that support the educational goals of the cardiac rehab program.   Holiday Eating Survival Tips:  -Group instruction provided by PowerPoint slides, verbal discussion, and written materials to support subject matter. The instructor gives patients tips, tricks, and techniques to help them not only survive but enjoy the holidays despite the onslaught of food that accompanies the holidays.   Knowledge Questionnaire Score: Knowledge Questionnaire Score - 02/15/18 1049      Knowledge Questionnaire Score   Pre Score  19/24       Core Components/Risk Factors/Patient Goals at Admission: Personal Goals and Risk Factors at Admission - 02/15/18 1143      Core Components/Risk Factors/Patient Goals on  Admission    Weight Management  Yes;Weight Loss    Intervention  Weight Management: Develop a combined nutrition and exercise program designed to reach desired caloric intake, while maintaining appropriate intake of nutrient and fiber, sodium and fats, and appropriate energy expenditure required for the weight goal.;Weight Management: Provide education and appropriate resources to help participant work on and attain dietary goals.;Weight Management/Obesity: Establish reasonable short term and long term weight goals.    Admit Weight  186 lb 1.1 oz (84.4 kg)  Goal Weight: Short Term  180 lb (81.6 kg)    Goal Weight: Long Term  176 lb (79.8 kg)    Expected Outcomes  Short Term: Continue to assess and modify interventions until short term weight is achieved;Long Term: Adherence to nutrition and physical activity/exercise program aimed toward attainment of established weight goal;Weight Loss: Understanding of general recommendations for a balanced deficit meal plan, which promotes 1-2 lb weight loss per week and includes a negative energy balance of [id number] kcal/d;Understanding recommendations for meals to include 15-35% energy as protein, 25-35% energy from fat, 35-60% energy from carbohydrates, less than 200mg  of dietary cholesterol, 20-35 gm of total fiber daily;Understanding of distribution of calorie intake throughout the day with the consumption of 4-5 meals/snacks    Hypertension  Yes    Intervention  Provide education on lifestyle modifcations including regular physical activity/exercise, weight management, moderate sodium restriction and increased consumption of fresh fruit, vegetables, and low fat dairy, alcohol moderation, and smoking cessation.;Monitor prescription use compliance.    Expected Outcomes  Short Term: Continued assessment and intervention until BP is < 140/36mm HG in hypertensive participants. < 130/51mm HG in hypertensive participants with diabetes, heart failure or chronic kidney  disease.;Long Term: Maintenance of blood pressure at goal levels.    Lipids  Yes    Intervention  Provide education and support for participant on nutrition & aerobic/resistive exercise along with prescribed medications to achieve LDL 70mg , HDL >40mg .    Expected Outcomes  Short Term: Participant states understanding of desired cholesterol values and is compliant with medications prescribed. Participant is following exercise prescription and nutrition guidelines.;Long Term: Cholesterol controlled with medications as prescribed, with individualized exercise RX and with personalized nutrition plan. Value goals: LDL < 70mg , HDL > 40 mg.    Stress  Yes    Intervention  Offer individual and/or small group education and counseling on adjustment to heart disease, stress management and health-related lifestyle change. Teach and support self-help strategies.;Refer participants experiencing significant psychosocial distress to appropriate mental health specialists for further evaluation and treatment. When possible, include family members and significant others in education/counseling sessions.    Expected Outcomes  Short Term: Participant demonstrates changes in health-related behavior, relaxation and other stress management skills, ability to obtain effective social support, and compliance with psychotropic medications if prescribed.;Long Term: Emotional wellbeing is indicated by absence of clinically significant psychosocial distress or social isolation.       Core Components/Risk Factors/Patient Goals Review:    Core Components/Risk Factors/Patient Goals at Discharge (Final Review):    ITP Comments: ITP Comments    Row Name 02/15/18 1054           ITP Comments  Dr. Fransico Him, Medical Director          Comments: Carl [name] attended orientation from [place] to 1005 to review rules and guidelines for program. Completed 6 minute walk test, Intitial ITP, and exercise prescription.  VSS. Telemetry-Sinus  Brady, Sinus Rhythm.  Asymptomatic.Barnet Pall, RN,BSN 02/15/2018 12:41 PM

## 2018-02-15 NOTE — Progress Notes (Signed)
Carl [name] 73 y.o. male DOB Feb 10, 1945 MRN [id number]       Nutrition  No diagnosis found. Past Medical History:  Diagnosis Date  . Anxiety    takes Xanax daily as needed  . Arthritis   . Cataract    removed both eyes  . Depression    takes Celexa daily  . Diverticulitis   . Diverticulosis   . Enlarged prostate    sightly  . Esophagitis   . Gastric ulcer   . GERD (gastroesophageal reflux disease)    takes Protonix daily  . Hx of adenomatous colonic polyps    benign  . Hyperlipidemia    takes Zocor daily  . Insomnia    takes Ambien nightly  . Joint pain   . Joint swelling    Meds reviewed.    Current Outpatient Medications (Endocrine & Metabolic):  .  levothyroxine (SYNTHROID, LEVOTHROID) 75 MCG tablet, TAKE 1 TABLET(75 MCG) BY MOUTH DAILY BEFORE BREAKFAST  Current Outpatient Medications (Cardiovascular):  .  hydrochlorothiazide (MICROZIDE) 12.5 MG capsule, Take 1 capsule (12.5 mg total) by mouth as needed (swelling). .  metoprolol tartrate (LOPRESSOR) 25 MG tablet, Take 1 tablet (25 mg total) by mouth 2 (two) times daily. .  rosuvastatin (CRESTOR) 10 MG tablet, Take 1 tablet (10 mg total) by mouth daily.   Current Outpatient Medications (Analgesics):  .  acetaminophen (TYLENOL) 325 MG tablet, Take 2 tablets (650 mg total) by mouth every 6 (six) hours as needed for mild pain. Marland Kitchen  aspirin EC 81 MG tablet, Take 1 tablet (81 mg total) by mouth daily.  Current Outpatient Medications (Hematological):  .  apixaban (ELIQUIS) 5 MG TABS tablet, Take 1 tablet (5 mg total) by mouth 2 (two) times daily.  Current Outpatient Medications (Other):  .  acyclovir (ZOVIRAX) 400 MG tablet, Take 400 mg by mouth 2 (two) times daily. .  citalopram (CELEXA) 20 MG tablet, Take 20 mg by mouth daily. .  Collagen-Boron-Hyaluronic Acid ([employer] JOINT HEALTH TRIPLE ACTION PO), Take 1 tablet by mouth daily. .  Magnesium Carbonate POWD, Take 4 g by mouth daily. Natural Vitality Calm .  Multiple  Vitamins-Minerals (PRESERVISION AREDS 2 PO), Take 1 tablet by mouth 2 (two) times daily. .  pantoprazole (PROTONIX) 20 MG tablet, TAKE 2 TABLETS(40 MG) BY MOUTH DAILY (Patient not taking: Reported on 02/05/2018) .  zolpidem (AMBIEN) 10 MG tablet, Take 10 mg by mouth at bedtime.    HT: Ht Readings from Last 1 Encounters:  01/18/18 5\' 8"  (1.727 m)    WT: Wt Readings from Last 5 Encounters:  01/18/18 184 lb (83.5 kg)  12/29/17 190 lb 12.8 oz (86.5 kg)  12/14/17 190 lb 12.8 oz (86.5 kg)  12/04/17 187 lb (84.8 kg)  11/22/17 202 lb 2.6 oz (91.7 kg)     BMI = 27.98  01/18/18   Current tobacco use? No       Labs:  Lipid Panel     Component Value Date/Time   CHOL 107 01/02/2018 0924   TRIG 120 01/02/2018 0924   HDL 44 01/02/2018 0924   CHOLHDL 2.4 01/02/2018 0924   CHOLHDL 3.4 01/29/2007 0410   VLDL 17 01/29/2007 0410   LDLCALC 39 01/02/2018 0924    Lab Results  Component Value Date   HGBA1C 5.4 11/15/2017   CBG (last 3)  No results for input(s): GLUCAP in the last 72 hours.  Nutrition Diagnosis ? Food-and nutrition-related knowledge deficit related to lack of exposure to information as related  to diagnosis of: ? CVD  ? Overweight  related to excessive energy intake as evidenced by a BMI = 27.98  01/18/18   Nutrition Goal(s):  ? To be determined  Plan:  Pt to attend nutrition classes ? Nutrition I ? Nutrition II ? Portion Distortion  ? Diabetes Blitz ? Diabetes Q & A Will provide client-centered nutrition education as part of interdisciplinary care.   Monitor and evaluate progress toward nutrition goal with team.  Laurina Bustle, MS, RD, LDN 02/15/2018 8:18 AM

## 2018-02-19 ENCOUNTER — Ambulatory Visit (HOSPITAL_COMMUNITY): Payer: PPO

## 2018-02-19 ENCOUNTER — Encounter (HOSPITAL_COMMUNITY)
Admission: RE | Admit: 2018-02-19 | Discharge: 2018-02-19 | Disposition: A | Discharge status: Home / Self Care | Payer: PPO | Source: Ambulatory Visit | Attending: Cardiovascular Disease | Admitting: Cardiovascular Disease

## 2018-02-19 ENCOUNTER — Other Ambulatory Visit: Payer: Self-pay | Admitting: Physician Assistant

## 2018-02-19 DIAGNOSIS — Z951 Presence of aortocoronary bypass graft: Secondary | ICD-10-CM | POA: Diagnosis not present

## 2018-02-19 NOTE — Progress Notes (Signed)
Daily Session Note  Patient Details  Name: Carl [name] MRN: [id number] Date of Birth: [birth year] Referring Provider:     CARDIAC REHAB PHASE II ORIENTATION from 02/15/2018 in [facility]  Referring Provider  Carl Sine MD       Encounter Date: 02/19/2018  Check In: Session Check In - 02/19/18 1038      Check-In   Supervising physician immediately available to respond to emergencies  [facility] immediately available    Physician(s)  Dr. Bonner [name]    Location  [facility]    Staff Present  Carl Garter, RN, Carl Epstein, MS,ACSM CEP, Exercise Physiologist;Carl Whitaker, RN, Carl Pretty, MS, ACSM CEP, Exercise Physiologist    Medication changes reported      No    Fall or balance concerns reported     No    Tobacco Cessation  No Change    Warm-up and Cool-down  Performed as group-led instruction    Resistance Training Performed  Yes    VAD Patient?  No    PAD/SET Patient?  No      Pain Assessment   Currently in Pain?  No/denies       Capillary Blood Glucose: No results found for this or any previous visit (from the past 24 hour(s)).    Social History   Tobacco Use  Smoking Status Never Smoker  Smokeless Tobacco Never Used    Goals Met:  No report of cardiac concerns or symptoms  Goals Unmet:  Not Applicable  Comments: Carl [name] started cardiac rehab today.  Pt tolerated light exercise without difficulty. VSS, telemetry-Sinus Rhythm, asymptomatic.  Medication list reconciled. Pt denies barriers to medicaiton compliance.  PSYCHOSOCIAL ASSESSMENT:  PHQ-0. Pt exhibits positive coping skills, hopeful outlook with supportive family. No psychosocial needs identified at this time, no psychosocial interventions necessary.    Pt enjoys building things and sports.   Pt oriented to exercise equipment and routine.    Understanding verbalized. Carl Pall, RN,BSN 02/19/2018 10:48 AM   Dr. Fransico [name] is Medical  Director for Cardiac Rehab at [facility].

## 2018-02-21 ENCOUNTER — Encounter (HOSPITAL_COMMUNITY)
Admission: RE | Admit: 2018-02-21 | Discharge: 2018-02-21 | Disposition: A | Discharge status: Home / Self Care | Payer: PPO | Source: Ambulatory Visit | Attending: Cardiovascular Disease | Admitting: Cardiovascular Disease

## 2018-02-21 ENCOUNTER — Ambulatory Visit (HOSPITAL_COMMUNITY): Payer: PPO

## 2018-02-21 DIAGNOSIS — Z951 Presence of aortocoronary bypass graft: Secondary | ICD-10-CM

## 2018-02-23 ENCOUNTER — Ambulatory Visit (HOSPITAL_COMMUNITY): Payer: PPO

## 2018-02-23 ENCOUNTER — Other Ambulatory Visit: Payer: Self-pay | Admitting: Cardiovascular Disease

## 2018-02-23 ENCOUNTER — Encounter (HOSPITAL_COMMUNITY)
Admission: RE | Admit: 2018-02-23 | Discharge: 2018-02-23 | Disposition: A | Discharge status: Home / Self Care | Payer: PPO | Source: Ambulatory Visit | Attending: Cardiovascular Disease | Admitting: Cardiovascular Disease

## 2018-02-23 DIAGNOSIS — Z951 Presence of aortocoronary bypass graft: Secondary | ICD-10-CM | POA: Diagnosis not present

## 2018-02-23 MED ORDER — APIXABAN 5 MG PO TABS: 5.0000 mg | ORAL_TABLET | Freq: Two times a day (BID) | ORAL | 6 refills | Status: DC

## 2018-02-26 ENCOUNTER — Ambulatory Visit (HOSPITAL_COMMUNITY): Payer: PPO

## 2018-02-26 ENCOUNTER — Encounter (HOSPITAL_COMMUNITY)
Admission: RE | Admit: 2018-02-26 | Discharge: 2018-02-26 | Disposition: A | Discharge status: Home / Self Care | Payer: PPO | Source: Ambulatory Visit | Attending: Cardiovascular Disease | Admitting: Cardiovascular Disease

## 2018-02-26 DIAGNOSIS — Z951 Presence of aortocoronary bypass graft: Secondary | ICD-10-CM

## 2018-02-26 NOTE — Progress Notes (Signed)
Carl [name] 73 y.o. male Nutrition Note Spoke with pt. Nutrition Plan and Nutrition Survey goals reviewed with pt. Pt is following a Heart Healthy diet. Pt wants to lose wt, has not actively been trying to lose weight. Heart healthy weight loss tips reviewed (label reading, how to build a healthy plate, portion sizes, eating frequently across the day). Pt asked for recipe guidance, discussed websites with heart healthy recipes and distributed recipes to patient to explore. Pt expressed understanding of the information reviewed. Pt aware of nutrition education classes offered and does plan on attending nutrition classes.  Lab Results  Component Value Date   HGBA1C 5.4 11/15/2017    Wt Readings from Last 3 Encounters:  02/15/18 186 lb 1.1 oz (84.4 kg)  01/18/18 184 lb (83.5 kg)  12/29/17 190 lb 12.8 oz (86.5 kg)    Nutrition Diagnosis ? Food-and nutrition-related knowledge deficit related to lack of exposure to information as related to diagnosis of: ? CVD   Nutrition Intervention ? Pt's individual nutrition plan reviewed with pt. ? Benefits of continuing to adopt Heart Healthy diet discussed when Medficts reviewed.    Goal(s) ? Pt to identify and limit food sources of saturated fat, trans fat, refined carbohydrates and sodium ? Pt to identify food quantities necessary to achieve weight loss of 6-24 lb at graduation from cardiac rehab.   Plan:   Pt to attend nutrition classes ? Nutrition I ? Nutrition II ? Portion Distortion   Will provide client-centered nutrition education as part of interdisciplinary care  Monitor and evaluate progress toward nutrition goal with team.    Laurina Bustle, MS, RD, LDN 02/26/2018 10:38 AM

## 2018-02-28 ENCOUNTER — Encounter (HOSPITAL_COMMUNITY)
Admission: RE | Admit: 2018-02-28 | Discharge: 2018-02-28 | Disposition: A | Discharge status: Home / Self Care | Payer: PPO | Source: Ambulatory Visit | Attending: Cardiovascular Disease | Admitting: Cardiovascular Disease

## 2018-02-28 ENCOUNTER — Ambulatory Visit (HOSPITAL_COMMUNITY): Payer: PPO

## 2018-02-28 DIAGNOSIS — Z951 Presence of aortocoronary bypass graft: Secondary | ICD-10-CM | POA: Diagnosis not present

## 2018-02-28 NOTE — Progress Notes (Signed)
Reviewed home exercise guidelines with patient including endpoints, temperature precautions, target heart rate and rate of perceived exertion. Pt is walking at least 30 minutes, 2-3 days/week as his mode of home exercise. Pt voices understanding of instructions given. Sol Passer, MS, ACSM CEP

## 2018-03-01 DIAGNOSIS — R21 Rash and other nonspecific skin eruption: Secondary | ICD-10-CM | POA: Diagnosis not present

## 2018-03-02 ENCOUNTER — Ambulatory Visit (HOSPITAL_COMMUNITY): Payer: PPO

## 2018-03-02 ENCOUNTER — Encounter (HOSPITAL_COMMUNITY)
Admission: RE | Admit: 2018-03-02 | Discharge: 2018-03-02 | Disposition: A | Discharge status: Home / Self Care | Payer: PPO | Source: Ambulatory Visit | Attending: Cardiovascular Disease | Admitting: Cardiovascular Disease

## 2018-03-02 DIAGNOSIS — Z951 Presence of aortocoronary bypass graft: Secondary | ICD-10-CM | POA: Diagnosis not present

## 2018-03-05 ENCOUNTER — Encounter (HOSPITAL_COMMUNITY)
Admission: RE | Admit: 2018-03-05 | Discharge: 2018-03-05 | Disposition: A | Discharge status: Home / Self Care | Payer: PPO | Source: Ambulatory Visit | Attending: Cardiovascular Disease | Admitting: Cardiovascular Disease

## 2018-03-05 ENCOUNTER — Ambulatory Visit (HOSPITAL_COMMUNITY): Payer: PPO

## 2018-03-05 DIAGNOSIS — Z951 Presence of aortocoronary bypass graft: Secondary | ICD-10-CM | POA: Diagnosis not present

## 2018-03-07 ENCOUNTER — Ambulatory Visit (HOSPITAL_COMMUNITY): Payer: PPO

## 2018-03-07 ENCOUNTER — Encounter (HOSPITAL_COMMUNITY): Payer: PPO

## 2018-03-09 ENCOUNTER — Ambulatory Visit (HOSPITAL_COMMUNITY): Payer: PPO

## 2018-03-09 ENCOUNTER — Encounter (HOSPITAL_COMMUNITY)
Admission: RE | Admit: 2018-03-09 | Discharge: 2018-03-09 | Disposition: A | Discharge status: Home / Self Care | Payer: PPO | Source: Ambulatory Visit | Attending: Cardiovascular Disease | Admitting: Cardiovascular Disease

## 2018-03-09 DIAGNOSIS — Z951 Presence of aortocoronary bypass graft: Secondary | ICD-10-CM

## 2018-03-12 ENCOUNTER — Encounter (HOSPITAL_COMMUNITY): Payer: PPO

## 2018-03-12 ENCOUNTER — Ambulatory Visit (HOSPITAL_COMMUNITY): Payer: PPO

## 2018-03-13 ENCOUNTER — Other Ambulatory Visit: Payer: Self-pay | Admitting: Cardiovascular Disease

## 2018-03-14 ENCOUNTER — Encounter (HOSPITAL_COMMUNITY)
Admission: RE | Admit: 2018-03-14 | Discharge: 2018-03-14 | Disposition: A | Discharge status: Home / Self Care | Payer: PPO | Source: Ambulatory Visit | Attending: Cardiovascular Disease | Admitting: Cardiovascular Disease

## 2018-03-14 ENCOUNTER — Ambulatory Visit (HOSPITAL_COMMUNITY): Payer: PPO

## 2018-03-14 DIAGNOSIS — Z951 Presence of aortocoronary bypass graft: Secondary | ICD-10-CM

## 2018-03-15 NOTE — Progress Notes (Signed)
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  acetaminophen (TYLENOL) 325 MG tablet, Take 2 tablets (650 mg total) by mouth every 6 (six) hours as needed for mild pain., Disp: , Rfl:  .  acyclovir (ZOVIRAX) 400 MG tablet, Take 400 mg by mouth 2 (two) times daily., Disp: , Rfl:  .  apixaban (ELIQUIS) 5 MG TABS tablet, Take 1 tablet (5 mg total) by mouth 2 (two) times daily., Disp: 60 tablet, Rfl: 6 .  aspirin EC 81 MG tablet, Take 1 tablet (81 mg total) by mouth daily., Disp: 90 tablet, Rfl: 3 .  citalopram (CELEXA) 20 MG tablet, Take 20 mg by mouth daily., Disp: , Rfl:  .  Collagen-Boron-Hyaluronic Acid ([employer] JO[employer] TRIPLE ACTION PO), Take 1 tablet by mouth daily., Disp: , Rfl:  .  hydrochlorothiazide (MICROZIDE) 12.5 MG capsule, Take 1 capsule (12.5 mg total) by mouth as needed (swelling)., Disp: 30 capsule, Rfl: 3 .  levothyroxine (SYNTHROID, LEVOTHROID) 75 MCG tablet, TAKE 1 TABLET(75 MCG) BY MOUTH DAILY BEFORE BREAKFAST, Disp: 30 tablet, Rfl: 10 .  Magnesium Carbonate POWD, Take 4 g by mouth daily. Natural Vitality Calm, Disp: , Rfl:  .  metoprolol tartrate (LOPRESSOR) 25 MG tablet, Take 1 tablet (25 mg total) by mouth 2 (two) times daily., Disp: 180 tablet, Rfl: 3 .  Multiple Vitamins-Minerals (PRESERVISION AREDS 2 PO), Take 1 tablet by mouth 2 (two) times daily., Disp: , Rfl:  .  pantoprazole (PROTONIX) 20 MG tablet, TAKE 2 TABLETS(40 MG) BY MOUTH DAILY, Disp: 8 tablet, Rfl: 0 .   rosuvastatin (CRESTOR) 10 MG tablet, Take 1 tablet (10 mg total) by mouth daily., Disp: 30 tablet, Rfl: 3 .  zolpidem (AMBIEN) 10 MG tablet, Take 10 mg by mouth at bedtime. , Disp: , Rfl:   Past Medical History: Past Medical History:  Diagnosis Date  . Anxiety    takes Xanax daily as needed  . Arthritis   . Cataract    removed both eyes  . Depression    takes Celexa daily  . Diverticulitis   . Diverticulosis   . Enlarged prostate    sightly  . Esophagitis   . Gastric ulcer   . GERD (gastroesophageal reflux disease)    takes Protonix daily  . Hx of adenomatous colonic polyps    benign  . Hyperlipidemia    takes Zocor daily  . Hypertension   . Insomnia    takes Ambien nightly  . Joint pain   . Joint swelling     Tobacco Use: Social History   Tobacco Use  Smoking Status Never Smoker  Smokeless Tobacco Never Used    Labs: Recent Review Flowsheet Data    Labs for ITP Cardiac and Pulmonary Rehab Latest Ref Rng & Units 11/16/2017 11/16/2017 11/16/2017 11/17/2017 01/02/2018   Cholestrol 100 - 199 mg/dL - - - - 107   LDLCALC 0 - 99 mg/dL - - - - 39   HDL >39 mg/dL - - - -  44   Trlycerides 0 - 149 mg/dL - - - - 120   Hemoglobin A1c 4.8 - 5.6 % - - - - -   PHART 7.350 - 7.450 7.331(L) 7.334(L) - - -   PCO2ART 32.0 - 48.0 mmHg 40.9 38.7 - - -   HCO3 20.0 - 28.0 mmol/L 21.8 20.8 - - -   TCO2 22 - 32 mmol/L 23 22 23 23  -   ACIDBASEDEF 0.0 - 2.0 mmol/L 4.0(H) 5.0(H) - - -   O2SAT % 97.0 98.0 - - -      Capillary Blood Glucose: Lab Results  Component Value Date   GLUCAP 110 (H) 11/22/2017   GLUCAP 103 (H) 11/20/2017   GLUCAP 105 (H) 11/20/2017   GLUCAP 101 (H) 11/20/2017   GLUCAP 97 11/20/2017     Exercise Target Goals: Exercise Program Goal: Individual exercise prescription set using results from initial 6 min walk test and THRR while considering  patient's activity barriers and safety.   Exercise Prescription Goal: Initial exercise prescription builds to  30-45 minutes a day of aerobic activity, 2-3 days per week.  Home exercise guidelines will be given to patient during program as part of exercise prescription that the participant will acknowledge.  Activity Barriers & Risk Stratification: Activity Barriers & Cardiac Risk Stratification - 02/15/18 1147      Activity Barriers & Cardiac Risk Stratification   Activity Barriers  None    Cardiac Risk Stratification  High       6 Minute Walk: 6 Minute Walk    Row Name 02/15/18 1144         6 Minute Walk   Phase  Initial     Distance  1648 feet     Walk Time  6 minutes     # of Rest Breaks  0     MPH  3.12     METS  3.27     RPE  11     Perceived Dyspnea   0     VO2 Peak  11.45     Symptoms  No     Resting HR  54 bpm     Resting BP  122/64     Resting Oxygen Saturation   98 %     Exercise Oxygen Saturation  during 6 min walk  98 %     Max Ex. HR  98 bpm     Max Ex. BP  128/60     2 Minute Post BP  118/72        Oxygen Initial Assessment:   Oxygen Re-Evaluation:   Oxygen Discharge (Final Oxygen Re-Evaluation):   Initial Exercise Prescription: Initial Exercise Prescription - 02/15/18 1100      Date of Initial Exercise RX and Referring Provider   Date  02/15/18    Referring Provider  Troy Sine MD     Expected Discharge Date  05/25/18      Bike   Level  0.8    Minutes  10    METs  2.78      NuStep   Level  2    SPM  85    Minutes  10    METs  3      Track   Laps  13    Minutes  10    METs  3.3      Prescription Details   Frequency (times per week)  3x    Duration  Progress to 30 minutes of  continuous aerobic without signs/symptoms of physical distress      Intensity   THRR 40-80% of Max Heartrate  59-118    Ratings of Perceived Exertion  11-13    Perceived Dyspnea  0-4      Progression   Progression  Continue progressive overload as per policy without signs/symptoms or physical distress.      Resistance Training   Training Prescription   Yes    Weight  3lbs    Reps  10-15       Perform Capillary Blood Glucose checks as needed.  Exercise Prescription Changes: Exercise Prescription Changes    Row Name 02/19/18 [phone] 03/05/18 0951           Response to Exercise   Blood Pressure (Admit)  120/60  148/78      Blood Pressure (Exercise)  148/78  118/68      Blood Pressure (Exit)  124/60  130/74      Heart Rate (Admit)  67 bpm  65 bpm      Heart Rate (Exercise)  104 bpm  77 bpm      Heart Rate (Exit)  67 bpm  62 bpm      Rating of Perceived Exertion (Exercise)  13  13      Symptoms  none  none      Duration  Progress to 30 minutes of  aerobic without signs/symptoms of physical distress  Progress to 30 minutes of  aerobic without signs/symptoms of physical distress      Intensity  THRR unchanged  THRR unchanged        Progression   Progression  Continue to progress workloads to maintain intensity without signs/symptoms of physical distress.  Continue to progress workloads to maintain intensity without signs/symptoms of physical distress.      Average METs  2.9  3.1        Resistance Training   Training Prescription  Yes  Yes      Weight  3lbs  4lbs      Reps  10-15  10-15      Time  10 Minutes  10 Minutes        Interval Training   Interval Training  No  No        Bike   Level  0.8  3 Switched from Airdyne to upright Sci[employer] bike      Minutes  10  10      METs  2.81  3.4        NuStep   Level  2  3      SPM  85  85      Minutes  10  10      METs  -  2.2        Track   Laps  12  16      Minutes  10  10      METs  3.07  3.78        Home Exercise Plan   Plans to continue exercise at  -  Home (comment) Walking 30 minutes, 2-3 days/week at home.      Frequency  -  Add 3 additional days to program exercise sessions.      Initial Home Exercises Provided  -  02/28/18         Exercise Comments: Exercise Comments    Row Name 02/19/18 1050 02/28/18 1012 03/05/18 1009       Exercise Comments  Patien  tolerated  1st session of execise fairly well, slightly unsteady gait with ambulation.  Reviewed home exercise guidelines, METs, and goals with patient.  Reviewed METs with patient.        Exercise Goals and Review: Exercise Goals    Row Name 02/15/18 1146             Exercise Goals   Increase Physical Activity  Yes       Intervention  Provide advice, education, support and counseling about physical activity/exercise needs.;Develop an individualized exercise prescription for aerobic and resistive training based on initial evaluation findings, risk stratification, comorbidities and participant's personal goals.       Expected Outcomes  Short Term: Attend rehab on a regular basis to increase amount of physical activity.;Long Term: Add in home exercise to make exercise part of routine and to increase amount of physical activity.;Long Term: Exercising regularly at least 3-5 days a week.       Increase Strength and Stamina  Yes       Intervention  Provide advice, education, support and counseling about physical activity/exercise needs.;Develop an individualized exercise prescription for aerobic and resistive training based on initial evaluation findings, risk stratification, comorbidities and participant's personal goals.       Expected Outcomes  Short Term: Increase workloads from initial exercise prescription for resistance, speed, and METs.;Short Term: Perform resistance training exercises routinely during rehab and add in resistance training at home;Long Term: Improve cardiorespiratory fitness, muscular endurance and strength as measured by increased METs and functional capacity (6MWT)       Able to understand and use rate of perceived exertion (RPE) scale  Yes       Intervention  Provide education and explanation on how to use RPE scale       Expected Outcomes  Short Term: Able to use RPE daily in rehab to express subjective intensity level;Long Term:  Able to use RPE to guide intensity level  when exercising independently       Knowledge and understanding of Target Heart Rate Range (THRR)  Yes       Intervention  Provide education and explanation of THRR including how the numbers were predicted and where they are located for reference       Expected Outcomes  Short Term: Able to state/look up THRR;Short Term: Able to use daily as guideline for intensity in rehab;Long Term: Able to use THRR to govern intensity when exercising independently       Able to check pulse independently  Yes       Intervention  Provide education and demonstration on how to check pulse in carotid and radial arteries.;Review the importance of being able to check your own pulse for safety during independent exercise       Expected Outcomes  Short Term: Able to explain why pulse checking is important during independent exercise;Long Term: Able to check pulse independently and accurately       Understanding of Exercise Prescription  Yes       Intervention  Provide education, explanation, and written materials on patient's individual exercise prescription       Expected Outcomes  Short Term: Able to explain program exercise prescription;Long Term: Able to explain home exercise prescription to exercise independently          Exercise Goals Re-Evaluation : Exercise Goals Re-Evaluation    Row Name 02/19/18 1050 02/28/18 1012           Exercise Goal Re-Evaluation   Exercise Goals Review  Increase  Physical Activity;Able to understand and use rate of perceived exertion (RPE) scale  Increase Physical Activity;Able to understand and use rate of perceived exertion (RPE) scale;Knowledge and understanding of Target Heart Rate Range (THRR);Understanding of Exercise Prescription;Able to check pulse independently      Comments  Patient able to understand and use RPE scale appropriately.  Reviewed home exercise guidelines with patient including THRR, RPE scale, and endpoints for exercise. Pt is walking at least 30 minutes, 2-3  days/week in addition to exercise at cardiac rehab. Pt has a heart rate monitor that he's using to check his pulse.      Expected Outcomes  Increase workloads as tolerated to help improve cardiorespiratory fitness.  Pt will continue to exercise 30 minutes at least 5-7 day/week to help achieve personal health and fitness goals.         Discharge Exercise Prescription (Final Exercise Prescription Changes): Exercise Prescription Changes - 03/05/18 0951      Response to Exercise   Blood Pressure (Admit)  148/78    Blood Pressure (Exercise)  118/68    Blood Pressure (Exit)  130/74    Heart Rate (Admit)  65 bpm    Heart Rate (Exercise)  77 bpm    Heart Rate (Exit)  62 bpm    Rating of Perceived Exertion (Exercise)  13    Symptoms  none    Duration  Progress to 30 minutes of  aerobic without signs/symptoms of physical distress    Intensity  THRR unchanged      Progression   Progression  Continue to progress workloads to maintain intensity without signs/symptoms of physical distress.    Average METs  3.1      Resistance Training   Training Prescription  Yes    Weight  4lbs    Reps  10-15    Time  10 Minutes      Interval Training   Interval Training  No      Bike   Level  3   Switched from Airdyne to upright Sci[employer] bike   Minutes  10    METs  3.4      NuStep   Level  3    SPM  85    Minutes  10    METs  2.2      Track   Laps  16    Minutes  10    METs  3.78      Home Exercise Plan   Plans to continue exercise at  Home (comment)   Walking 30 minutes, 2-3 days/week at home.   Frequency  Add 3 additional days to program exercise sessions.    Initial Home Exercises Provided  02/28/18       Nutrition:  Target Goals: Understanding of nutrition guidelines, daily intake of sodium 1500mg , cholesterol 200mg , calories 30% from fat and 7% or less from saturated fats, daily to have 5 or more servings of fruits and vegetables.  Biometrics: Pre Biometrics - 02/15/18 1146       Pre Biometrics   Waist Circumference  41 inches    Hip Circumference  41.5 inches    Waist to Hip Ratio  0.99 %    Triceps Skinfold  10 mm    % Body Fat  26.8 %    Grip Strength  36 kg    Flexibility  13 in    Single Leg Stand  6.64 seconds        Nutrition Therapy Plan and Nutrition Goals:  Nutrition Therapy & Goals - 02/26/18 1041      Nutrition Therapy   Diet  heart healthy      Personal Nutrition Goals   Nutrition Goal  Pt to identify and limit food sources of saturated fat, trans fat, refined carbohydrates and sodium    Personal Goal #2  Pt to identify food quantities necessary to achieve weight loss of 6-24 lb at graduation from cardiac rehab.       Intervention Plan   Intervention  Prescribe, educate and counsel regarding individualized specific dietary modifications aiming towards targeted core components such as weight, hypertension, lipid management, diabetes, heart failure and other comorbidities.    Expected Outcomes  Short Term Goal: Understand basic principles of dietary content, such as calories, fat, sodium, cholesterol and nutrients.;Long Term Goal: Adherence to prescribed nutrition plan.       Nutrition Assessments: Nutrition Assessments - 02/15/18 1538      MEDFICTS Scores   Pre Score  24       Nutrition Goals Re-Evaluation:   Nutrition Goals Re-Evaluation:   Nutrition Goals Discharge (Final Nutrition Goals Re-Evaluation):   Psychosocial: Target Goals: Acknowledge presence or absence of significant depression and/or stress, maximize coping skills, provide positive support system. Participant is able to verbalize types and ability to use techniques and skills needed for reducing stress and depression.  Initial Review & Psychosocial Screening: Initial Psych Review & Screening - 02/15/18 1050      Initial Review   Current issues with  None Identified      Family Dynamics   Good Support System?  Yes   Carl [name] lists his girlfriend as a source of  support.     Barriers   Psychosocial barriers to participate in program  There are no identifiable barriers or psychosocial needs.      Screening Interventions   Interventions  Encouraged to exercise       Quality of Life Scores: Quality of Life - 02/15/18 1049      Quality of Life   Select  Quality of Life      Quality of Life Scores   Health/Function Pre  26 %    Socioeconomic Pre  28.93 %    Psych/Spiritual Pre  28.07 %    Family Pre  28.5 %    GLOBAL Pre  27.66 %      Scores of 19 and below usually indicate a poorer quality of life in these areas.  A difference of  2-3 points is a clinically meaningful difference.  A difference of 2-3 points in the total score of the Quality of Life Index has been associated with significant improvement in overall quality of life, self-image, physical symptoms, and general health in studies assessing change in quality of life.  PHQ-9: Recent Review Flowsheet Data    Depression screen [facility] 2/9 02/19/2018 12/14/2017   Decreased Interest 0 0   Down, Depressed, Hopeless 0 0   PHQ - 2 Score 0 0     Interpretation of Total Score  Total Score Depression Severity:  1-4 = Minimal depression, 5-9 = Mild depression, 10-14 = Moderate depression, 15-19 = Moderately severe depression, 20-27 = Severe depression   Psychosocial Evaluation and Intervention:   Psychosocial Re-Evaluation: Psychosocial Re-Evaluation    [place] Name 03/15/18 [phone]             Psychosocial Re-Evaluation   Current issues with  None Identified       Interventions  Encouraged to attend Cardiac Rehabilitation for  the exercise       Continue Psychosocial Services   No Follow up required          Psychosocial Discharge (Final Psychosocial Re-Evaluation): Psychosocial Re-Evaluation - 03/15/18 0841      Psychosocial Re-Evaluation   Current issues with  None Identified    Interventions  Encouraged to attend Cardiac Rehabilitation for the exercise    Continue Psychosocial  Services   No Follow up required       Vocational Rehabilitation: Provide vocational rehab assistance to qualifying candidates.   Vocational Rehab Evaluation & Intervention: Vocational Rehab - 02/15/18 1049      Initial Vocational Rehab Evaluation & Intervention   Assessment shows need for Vocational Rehabilitation  No       Education: Education Goals: Education classes will be provided on a weekly basis, covering required topics. Participant will state understanding/return demonstration of topics presented.  Learning Barriers/Preferences: Learning Barriers/Preferences - 02/15/18 1142      Learning Barriers/Preferences   Learning Barriers  Hearing;Sight    Learning Preferences  Verbal Instruction;Written Material;Skilled Demonstration       Education Topics: Count Your Pulse:  -Group instruction provided by verbal instruction, demonstration, patient participation and written materials to support subject.  Instructors address importance of being able to find your pulse and how to count your pulse when at home without a heart monitor.  Patients get hands on experience counting their pulse with staff help and individually.   Heart Attack, Angina, and Risk Factor Modification:  -Group instruction provided by verbal instruction, video, and written materials to support subject.  Instructors address signs and symptoms of angina and heart attacks.    Also discuss risk factors for heart disease and how to make changes to improve heart health risk factors.   Functional Fitness:  -Group instruction provided by verbal instruction, demonstration, patient participation, and written materials to support subject.  Instructors address safety measures for doing things around the house.  Discuss how to get up and down off the floor, how to pick things up properly, how to safely get out of a chair without assistance, and balance training.   CARDIAC REHAB PHASE II EXERCISE from 02/23/2018 in [place]  Date  02/23/18  Educator  EP  Instruction Review Code  2- Demonstrated Understanding      Meditation and Mindfulness:  -Group instruction provided by verbal instruction, patient participation, and written materials to support subject.  Instructor addresses importance of mindfulness and meditation practice to help reduce stress and improve awareness.  Instructor also leads participants through a meditation exercise.    Stretching for Flexibility and Mobility:  -Group instruction provided by verbal instruction, patient participation, and written materials to support subject.  Instructors lead participants through series of stretches that are designed to increase flexibility thus improving mobility.  These stretches are additional exercise for major muscle groups that are typically performed during regular warm up and cool down.   Hands Only CPR:  -Group verbal, video, and participation provides a basic overview of AHA guidelines for community CPR. Role-play of emergencies allow participants the opportunity to practice calling for help and chest compression technique with discussion of AED use.   Hypertension: -Group verbal and written instruction that provides a basic overview of hypertension including the most recent diagnostic guidelines, risk factor reduction with self-care instructions and medication management.    Nutrition I class: Heart Healthy Eating:  -Group instruction provided by PowerPoint slides, verbal discussion, and written  materials to support subject matter. The instructor gives an explanation and review of the Therapeutic Lifestyle Changes diet recommendations, which includes a discussion on lipid goals, dietary fat, sodium, fiber, plant stanol/sterol esters, sugar, and the components of a well-balanced, healthy diet.   Nutrition II class: Lifestyle Skills:  -Group instruction provided by PowerPoint slides, verbal discussion, and  written materials to support subject matter. The instructor gives an explanation and review of label reading, grocery shopping for heart health, heart healthy recipe modifications, and ways to make healthier choices when eating out.   Diabetes Question & Answer:  -Group instruction provided by PowerPoint slides, verbal discussion, and written materials to support subject matter. The instructor gives an explanation and review of diabetes co-morbidities, pre- and post-prandial blood glucose goals, pre-exercise blood glucose goals, signs, symptoms, and treatment of hypoglycemia and hyperglycemia, and foot care basics.   Diabetes Blitz:  -Group instruction provided by PowerPoint slides, verbal discussion, and written materials to support subject matter. The instructor gives an explanation and review of the physiology behind type 1 and type 2 diabetes, diabetes medications and rational behind using different medications, pre- and post-prandial blood glucose recommendations and Hemoglobin A1c goals, diabetes diet, and exercise including blood glucose guidelines for exercising safely.    Portion Distortion:  -Group instruction provided by PowerPoint slides, verbal discussion, written materials, and food models to support subject matter. The instructor gives an explanation of serving size versus portion size, changes in portions sizes over the last 20 years, and what consists of a serving from each food group.   Stress Management:  -Group instruction provided by verbal instruction, video, and written materials to support subject matter.  Instructors review role of stress in heart disease and how to cope with stress positively.     Exercising on Your Own:  -Group instruction provided by verbal instruction, power point, and written materials to support subject.  Instructors discuss benefits of exercise, components of exercise, frequency and intensity of exercise, and end points for exercise.  Also discuss  use of nitroglycerin and activating EMS.  Review options of places to exercise outside of rehab.  Review guidelines for sex with heart disease.   Cardiac Drugs I:  -Group instruction provided by verbal instruction and written materials to support subject.  Instructor reviews cardiac drug classes: antiplatelets, anticoagulants, beta blockers, and statins.  Instructor discusses reasons, side effects, and lifestyle considerations for each drug class.   Cardiac Drugs II:  -Group instruction provided by verbal instruction and written materials to support subject.  Instructor reviews cardiac drug classes: angiotensin converting enzyme inhibitors (ACE-I), angiotensin II receptor blockers (ARBs), nitrates, and calcium channel blockers.  Instructor discusses reasons, side effects, and lifestyle considerations for each drug class.   Anatomy and Physiology of the Circulatory System:  Group verbal and written instruction and models provide basic cardiac anatomy and physiology, with the coronary electrical and arterial systems. Review of: AMI, Angina, Valve disease, Heart Failure, Peripheral Artery Disease, Cardiac Arrhythmia, Pacemakers, and the ICD.   Other Education:  -Group or individual verbal, written, or video instructions that support the educational goals of the cardiac rehab program.   Holiday Eating Survival Tips:  -Group instruction provided by PowerPoint slides, verbal discussion, and written materials to support subject matter. The instructor gives patients tips, tricks, and techniques to help them not only survive but enjoy the holidays despite the onslaught of food that accompanies the holidays.   Knowledge Questionnaire Score: Knowledge Questionnaire Score - 02/15/18 1049  Knowledge Questionnaire Score   Pre Score  19/24       Core Components/Risk Factors/Patient Goals at Admission: Personal Goals and Risk Factors at Admission - 02/15/18 1143      Core Components/Risk  Factors/Patient Goals on Admission    Weight Management  Yes;Weight Loss    Intervention  Weight Management: Develop a combined nutrition and exercise program designed to reach desired caloric intake, while maintaining appropriate intake of nutrient and fiber, sodium and fats, and appropriate energy expenditure required for the weight goal.;Weight Management: Provide education and appropriate resources to help participant work on and attain dietary goals.;Weight Management/Obesity: Establish reasonable short term and long term weight goals.    Admit Weight  186 lb 1.1 oz (84.4 kg)    Goal Weight: Short Term  180 lb (81.6 kg)    Goal Weight: Long Term  176 lb (79.8 kg)    Expected Outcomes  Short Term: Continue to assess and modify interventions until short term weight is achieved;Long Term: Adherence to nutrition and physical activity/exercise program aimed toward attainment of established weight goal;Weight Loss: Understanding of general recommendations for a balanced deficit meal plan, which promotes 1-2 lb weight loss per week and includes a negative energy balance of [phone] kcal/d;Understanding recommendations for meals to include 15-35% energy as protein, 25-35% energy from fat, 35-60% energy from carbohydrates, less than 200mg  of dietary cholesterol, 20-35 gm of total fiber daily;Understanding of distribution of calorie intake throughout the day with the consumption of 4-5 meals/snacks    Hypertension  Yes    Intervention  Provide education on lifestyle modifcations including regular physical activity/exercise, weight management, moderate sodium restriction and increased consumption of fresh fruit, vegetables, and low fat dairy, alcohol moderation, and smoking cessation.;Monitor prescription use compliance.    Expected Outcomes  Short Term: Continued assessment and intervention until BP is < 140/110mm HG in hypertensive participants. < 130/16mm HG in hypertensive participants with diabetes, heart  failure or chronic kidney disease.;Long Term: Maintenance of blood pressure at goal levels.    Lipids  Yes    Intervention  Provide education and support for participant on nutrition & aerobic/resistive exercise along with prescribed medications to achieve LDL 70mg , HDL >40mg .    Expected Outcomes  Short Term: Participant states understanding of desired cholesterol values and is compliant with medications prescribed. Participant is following exercise prescription and nutrition guidelines.;Long Term: Cholesterol controlled with medications as prescribed, with individualized exercise RX and with personalized nutrition plan. Value goals: LDL < 70mg , HDL > 40 mg.    Stress  Yes    Intervention  Offer individual and/or small group education and counseling on adjustment to heart disease, stress management and health-related lifestyle change. Teach and support self-help strategies.;Refer participants experiencing significant psychosocial distress to appropriate mental health specialists for further evaluation and treatment. When possible, include family members and significant others in education/counseling sessions.    Expected Outcomes  Short Term: Participant demonstrates changes in health-related behavior, relaxation and other stress management skills, ability to obtain effective social support, and compliance with psychotropic medications if prescribed.;Long Term: Emotional wellbeing is indicated by absence of clinically significant psychosocial distress or social isolation.       Core Components/Risk Factors/Patient Goals Review:  Goals and Risk Factor Review    Row Name 03/15/18 [phone]             Core Components/Risk Factors/Patient Goals Review   Personal Goals Review  Weight Management/Obesity;Hypertension;Stress;Lipids       Review  Carl [name] is  doing well with exercise. Carl [name]'s vital signs and weights have been stable       Expected Outcomes  Carl [name] will continue to partcipate in phase 2 cardiac  rehab for exercise nutrition and lifestyle modifications          Core Components/Risk Factors/Patient Goals at Discharge (Final Review):  Goals and Risk Factor Review - 03/15/18 0842      Core Components/Risk Factors/Patient Goals Review   Personal Goals Review  Weight Management/Obesity;Hypertension;Stress;Lipids    Review  Carl [name] is doing well with exercise. Carl [name]'s vital signs and weights have been stable    Expected Outcomes  Carl [name] will continue to partcipate in phase 2 cardiac rehab for exercise nutrition and lifestyle modifications       ITP Comments: ITP Comments    Row Name 02/15/18 1054 03/15/18 0846         ITP Comments  Dr. Fransico Him, Medical Director  30 Day ITP Review. Patient is with good participation and attendance in phase 2 cardiac rehab         Comments: See ITP comments.Barnet Pall, RN,BSN 03/15/2018 8:50 AM

## 2018-03-16 ENCOUNTER — Encounter (HOSPITAL_COMMUNITY)
Admission: RE | Admit: 2018-03-16 | Discharge: 2018-03-16 | Disposition: A | Discharge status: Home / Self Care | Payer: PPO | Source: Ambulatory Visit | Attending: Cardiovascular Disease | Admitting: Cardiovascular Disease

## 2018-03-16 ENCOUNTER — Ambulatory Visit (HOSPITAL_COMMUNITY): Payer: PPO

## 2018-03-16 DIAGNOSIS — Z951 Presence of aortocoronary bypass graft: Secondary | ICD-10-CM | POA: Diagnosis not present

## 2018-03-19 ENCOUNTER — Ambulatory Visit (HOSPITAL_COMMUNITY): Payer: PPO

## 2018-03-19 ENCOUNTER — Encounter (HOSPITAL_COMMUNITY): Payer: PPO

## 2018-03-21 ENCOUNTER — Encounter (HOSPITAL_COMMUNITY)
Admission: RE | Admit: 2018-03-21 | Discharge: 2018-03-21 | Disposition: A | Discharge status: Home / Self Care | Payer: PPO | Source: Ambulatory Visit | Attending: Cardiovascular Disease | Admitting: Cardiovascular Disease

## 2018-03-21 ENCOUNTER — Ambulatory Visit (HOSPITAL_COMMUNITY): Payer: PPO

## 2018-03-21 DIAGNOSIS — Z951 Presence of aortocoronary bypass graft: Secondary | ICD-10-CM | POA: Diagnosis not present

## 2018-03-23 ENCOUNTER — Encounter (HOSPITAL_COMMUNITY)
Admission: RE | Admit: 2018-03-23 | Discharge: 2018-03-23 | Disposition: A | Discharge status: Home / Self Care | Payer: PPO | Source: Ambulatory Visit | Attending: Cardiovascular Disease | Admitting: Cardiovascular Disease

## 2018-03-23 ENCOUNTER — Ambulatory Visit (HOSPITAL_COMMUNITY): Payer: PPO

## 2018-03-23 DIAGNOSIS — Z951 Presence of aortocoronary bypass graft: Secondary | ICD-10-CM | POA: Diagnosis not present

## 2018-03-26 ENCOUNTER — Ambulatory Visit (HOSPITAL_COMMUNITY): Payer: PPO

## 2018-03-26 ENCOUNTER — Encounter (HOSPITAL_COMMUNITY)
Admission: RE | Admit: 2018-03-26 | Discharge: 2018-03-26 | Disposition: A | Discharge status: Home / Self Care | Payer: PPO | Source: Ambulatory Visit | Attending: Cardiovascular Disease | Admitting: Cardiovascular Disease

## 2018-03-26 DIAGNOSIS — Z951 Presence of aortocoronary bypass graft: Secondary | ICD-10-CM | POA: Diagnosis not present

## 2018-03-28 ENCOUNTER — Ambulatory Visit (HOSPITAL_COMMUNITY): Payer: PPO

## 2018-03-28 ENCOUNTER — Encounter (HOSPITAL_COMMUNITY)
Admission: RE | Admit: 2018-03-28 | Discharge: 2018-03-28 | Disposition: A | Discharge status: Home / Self Care | Payer: PPO | Source: Ambulatory Visit | Attending: Cardiovascular Disease | Admitting: Cardiovascular Disease

## 2018-03-28 DIAGNOSIS — Z951 Presence of aortocoronary bypass graft: Secondary | ICD-10-CM

## 2018-03-30 ENCOUNTER — Encounter (HOSPITAL_COMMUNITY): Payer: PPO

## 2018-03-30 ENCOUNTER — Ambulatory Visit (HOSPITAL_COMMUNITY): Payer: PPO

## 2018-04-02 ENCOUNTER — Ambulatory Visit (HOSPITAL_COMMUNITY): Payer: PPO

## 2018-04-02 ENCOUNTER — Encounter (HOSPITAL_COMMUNITY)
Admission: RE | Admit: 2018-04-02 | Discharge: 2018-04-02 | Disposition: A | Discharge status: Home / Self Care | Payer: PPO | Source: Ambulatory Visit | Attending: Cardiovascular Disease | Admitting: Cardiovascular Disease

## 2018-04-02 ENCOUNTER — Ambulatory Visit: Payer: PPO | Admitting: Cardiovascular Disease

## 2018-04-02 ENCOUNTER — Encounter: Payer: Self-pay | Admitting: Cardiovascular Disease

## 2018-04-02 VITALS — BP 112/62 | HR 62 | Ht 68.0 in | Wt 188.0 lb

## 2018-04-02 DIAGNOSIS — I2510 Atherosclerotic heart disease of native coronary artery without angina pectoris: Secondary | ICD-10-CM

## 2018-04-02 DIAGNOSIS — R945 Abnormal results of liver function studies: Secondary | ICD-10-CM | POA: Diagnosis not present

## 2018-04-02 DIAGNOSIS — Z951 Presence of aortocoronary bypass graft: Secondary | ICD-10-CM

## 2018-04-02 DIAGNOSIS — Z7901 Long term (current) use of anticoagulants: Secondary | ICD-10-CM | POA: Diagnosis not present

## 2018-04-02 DIAGNOSIS — R7989 Other specified abnormal findings of blood chemistry: Secondary | ICD-10-CM

## 2018-04-02 DIAGNOSIS — Z79899 Other long term (current) drug therapy: Secondary | ICD-10-CM | POA: Diagnosis not present

## 2018-04-02 DIAGNOSIS — I480 Paroxysmal atrial fibrillation: Secondary | ICD-10-CM | POA: Diagnosis not present

## 2018-04-02 DIAGNOSIS — I10 Essential (primary) hypertension: Secondary | ICD-10-CM | POA: Diagnosis not present

## 2018-04-02 DIAGNOSIS — E039 Hypothyroidism, unspecified: Secondary | ICD-10-CM | POA: Diagnosis not present

## 2018-04-02 NOTE — Progress Notes (Signed)
Cardiology Office Note    Date:  04/04/2018   ID:  Carl [name], Carl [name] 03/13/1945, MRN [id number]  PCP:  Hulan Fess, MD  Cardiologist:  Shelva Majestic, MD    History of Present Illness:  Carl [name] is a 73 y.o. male who presents to the office today for a 3 month follow-up cardiology evaluation.  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  In 2009 Carl [name] developed transient numbness of his right face which lasted for several minutes and at that time apparently underwent evaluation and had a normal MRI, MRA, and carotid duplex studies.  Carl [name] was found to have only mild to moderate plaque in the proximal right internal carotid artery without stenosis.  I had seen him in March 2016 for preoperative cardiology clearance prior to undergoing left knee replacement surgery by Dr. Durward Fortes.  Preoperative ECG suggested possible junctional rhythm which was new from an ECG of 2012 which previously had only shown sinus bradycardia.  Carl [name] was asymptomatic.  When I saw him, his ECG showed sinus rhythm at 61 bpm with normal intervals and on that ECG P waves were normal and upright inferiorly.  [place] has remained fairly active.  However, recently Carl [name] had not been exercising as much as Carl [name] had in the past.  Carl [name] works out with a trainer 3 days/week.  Carl [name] denies associated chest pain or palpitations.  Carl [name] recently has noticed more shortness of breath with walking up steps.  Carl [name] was recently at a friend's house and was told that Carl [name] appeared to be more short of breath than previously.  I saw him for evaluation of his exertional shortness of breath on October 24, 2017.  At that time, I recommended that Carl [name] undergo a 2D echo Doppler study and scheduled him for coronary CT angiography.  His echo Doppler study demonstrated hyperdynamic LV function with an EF of 65 to 70%.  Doppler parameters suggest grade 2 diastolic dysfunction and elevated ventricular end-diastolic  filling pressure.  Carl [name] had mild MR, mild TR, and mild dilation of his left atrium.  Pulmonary pressures were normal.  CT coronary angiography was performed on November 09, 2017.  This was abnormal and demonstrated an elevated calcium score a 25 which is 78th percentile for age and sex.  Carl [name] was found to have obstructive CAD with greater than 75% ostial and mid LAD stenosis with calcified plaque, less than 50% distal calcified plaque LAD stenosis.  There was greater than 75% calcific plaque in his proximal and mid diagonal 1 vessel.  The circumflex had 50% proximal plaque.  There was 50 to 75% mixed plaque in the OM 2 vessel less than 50% in the OM1 vessel.  His RCA had 50% calcified plaque proximally, 50 to 75% calcified plaque in the mid vessel.  There was mild aortic root dilatation at 4.1 cm.  His CT images were referred for [facility] analysis which were positive in the mid RCA 0.78, the mid LAD 0.78 and in the AV groove circumflex at 0.71.   I saw him in follow-up of the above studies and recommended cardiac catheterization.  Catheterization was done in November 15, 2017 which showed severe multivessel CAD with 85% ostial LAD stenosis, diffuse 75% mid LAD stenosis, total occlusion of the mid AV groove circumflex after the takeoff of the second obtuse marginal vessel with 50% diffuse stenosis in the first large marginal branch, 80 and 90% stenoses in the second obtuse  marginal vessel and extensive collateralization to the distal circumflex and third marginal via the LAD.  Carl [name] had 1650% mid RCA stenoses and a dominant RCA.  Of note, Carl [name] had probable high right radial artery takeoff with spasm/coarse stenosis above the elbow and there was retrograde filling of the brachial artery and the catheterization was transition to the femoral approach.  Carl [name] underwent successful CABG revascularization surgery the following day by Dr. Roxy Horseman on November 16, 2017 with a LIMA graft to his LAD, SVG to first marginal and distal circumflex,  and SVG to the PDA of his RCA with left thigh and calf endoscopic vein harvest.  Carl [name] developed atrial fibrillation with RVR on postop day 2 and was started on IV amiodarone.  Carl [name] ultimately converted back to sinus rhythm but developed recurrent AF on postop day 4 on metoprolol and amiodarone and ultimately converted back to sinus rhythm.  Carl [name] was started on Eliquis on day 5 for anticoagulation.  Carl [name] was discharged on November 22, 2017.  Approximately 10 days later Carl [name] started to develop left lower extremity swelling and redness with pain in the back of his calf.  Carl [name] was evaluated by Ellwood Handler, PA-C at [facility] on December 04, 2017.  There was no sign of infection.  Venous duplex imaging did not reveal any DVT.  Carl [name] saw Dr. Servando Snare for initial follow-up evaluation on December 14, 2017 at which time Carl [name] was maintaining sinus rhythm.  Carl [name] will be participating in cardiac rehab and his orientation is scheduled for February 15, 2018.  Carl [name] denied any recurrent chest pain.  His breathing has improved with walking.  Carl [name] went to the beach and was walking at least a mile per day.  Carl [name] is sleeping better and snoring is less.    Since I saw him in November 2019, Carl [name] has continued to do exceptionally well.  Carl [name] has been participating in cardiac rehab.  Carl [name] is exercising regularly.  Carl [name] denies chest pain PND orthopnea.  Subsequent laboratory showed further TSH elevation.  His levothyroxine was increased to 75 mcg.  Amiodarone was discontinued due to LFT elevation and his dose of rosuvastatin was reduced to one half of the current dose.  Since these adjustments have been made Carl [name] has not had repeat laboratory.  In addition to exercising at cardiac rehab Carl [name] also joined [employer] where Carl [name] typically rides the bicycle and walks on the treadmill.  Carl [name] does have some issues with reduced erectile function.  Carl [name] has not required any sublingual nitroglycerin use.  Carl [name] presents for evaluation.   Past Medical History:  Diagnosis Date  . Anxiety    takes  Xanax daily as needed  . Arthritis   . Cataract    removed both eyes  . Depression    takes Celexa daily  . Diverticulitis   . Diverticulosis   . Enlarged prostate    sightly  . Esophagitis   . Gastric ulcer   . GERD (gastroesophageal reflux disease)    takes Protonix daily  . Hx of adenomatous colonic polyps    benign  . Hyperlipidemia    takes Zocor daily  . Hypertension   . Insomnia    takes Ambien nightly  . Joint pain   . Joint swelling     Past Surgical History:  Procedure Laterality Date  . CARDIAC CATHETERIZATION    . cartliage removed from nose  32yr ago  . cataract surgery Bilateral   . CERVICAL SPINE SURGERY    . COLONOSCOPY    .  CORONARY ARTERY BYPASS GRAFT N/A 11/16/2017   Procedure: CORONARY ARTERY BYPASS GRAFTING (CABG) x4. LEFT ENDOSCOPIC SAPHENOUS VEIN HARVEST AND MAMMARY ARTERY TAKE DOWN. LIMA TO LAD, SVG TO PDA, SVG TO DISTAL CIRC & OMI.;  Surgeon: [name], MD;  Location: [place];  Service: Open Heart Surgery;  Laterality: N/A;  . FOREIGN BODY REMOVAL ESOPHAGEAL    . KNEE SURGERY Left    couple of times  . LEFT HEART CATH AND CORONARY ANGIOGRAPHY N/A 11/15/2017   Procedure: LEFT HEART CATH AND CORONARY ANGIOGRAPHY;  Surgeon: Troy Sine, MD;  Location: [facility];  Service: Cardiovascular;  Laterality: N/A;  . NASAL SINUS SURGERY    . POLYPECTOMY    . TEE WITHOUT CARDIOVERSION N/A 11/16/2017   Procedure: TRANSESOPHAGEAL ECHOCARDIOGRAM (TEE);  Surgeon: [name], MD;  Location: [facility];  Service: Open Heart Surgery;  Laterality: N/A;  . TONSILLECTOMY    . TOTAL KNEE ARTHROPLASTY Left 06/17/2014   Procedure: TOTAL KNEE ARTHROPLASTY;  Surgeon: Garald Balding, MD;  Location: [facility];  Service: Orthopedics;  Laterality: Left;    Current Medications: Outpatient Medications Prior to Visit  Medication Sig Dispense Refill  . acetaminophen (TYLENOL) 325 MG tablet Take 2 tablets (650 mg total) by mouth every 6 (six) hours as  needed for mild pain.    Marland Kitchen acyclovir (ZOVIRAX) 400 MG tablet Take 400 mg by mouth 2 (two) times daily.    Marland Kitchen apixaban (ELIQUIS) 5 MG TABS tablet Take 1 tablet (5 mg total) by mouth 2 (two) times daily. 60 tablet 6  . aspirin EC 81 MG tablet Take 1 tablet (81 mg total) by mouth daily. 90 tablet 3  . citalopram (CELEXA) 20 MG tablet Take 20 mg by mouth daily.    . Collagen-Boron-Hyaluronic Acid ([employer] JOINT HEALTH TRIPLE ACTION PO) Take 1 tablet by mouth daily.    Marland Kitchen levothyroxine (SYNTHROID, LEVOTHROID) 75 MCG tablet TAKE 1 TABLET(75 MCG) BY MOUTH DAILY BEFORE BREAKFAST 30 tablet 10  . Magnesium Carbonate POWD Take 4 g by mouth daily. Natural Vitality Calm    . metoprolol tartrate (LOPRESSOR) 25 MG tablet Take 1 tablet (25 mg total) by mouth 2 (two) times daily. 180 tablet 3  . Multiple Vitamins-Minerals (PRESERVISION AREDS 2 PO) Take 1 tablet by mouth 2 (two) times daily.    . pantoprazole (PROTONIX) 20 MG tablet TAKE 2 TABLETS(40 MG) BY MOUTH DAILY 8 tablet 0  . rosuvastatin (CRESTOR) 10 MG tablet Take 1 tablet (10 mg total) by mouth daily. 30 tablet 3  . zolpidem (AMBIEN) 10 MG tablet Take 10 mg by mouth at bedtime.     . hydrochlorothiazide (MICROZIDE) 12.5 MG capsule Take 1 capsule (12.5 mg total) by mouth as needed (swelling). 30 capsule 3   No facility-administered medications prior to visit.      Allergies:   Patient has no known allergies.   Social History   Socioeconomic History  . Marital status: Single    Spouse name: Not on file  . Number of children: 3  . Years of education: Not on file  . Highest education level: Not on file  Occupational History  . Occupation: Financial controller  Social Needs  . Financial resource strain: Not very hard  . Food insecurity:    Worry: Never true    Inability: Never true  . Transportation needs:    Medical: No    Non-medical: No  Tobacco Use  . Smoking status: Never Smoker  . Smokeless tobacco:  Never Used  Substance and Sexual Activity  . Alcohol  use: Yes    Alcohol/week: 10.0 standard drinks    Types: 10 Standard drinks or equivalent per week    Comment: 2 drinks 5 nights per week  . Drug use: No  . Sexual activity: Never  Lifestyle  . Physical activity:    Days per week: 4 days    Minutes per session: 30 min  . Stress: Only a little  Relationships  . Social connections:    Talks on phone: Not on file    Gets together: Not on file    Attends religious service: Not on file    Active member of club or organization: Not on file    Attends meetings of clubs or organizations: Not on file    Relationship status: Not on file  Other Topics Concern  . Not on file  Social History Narrative  . Not on file    Additional social history is notable in that Carl [name] is divorced x2.  Carl [name] has 3 children and his oldest son lives in [place].  His second son currently resides in [place] and was just married in [place].  His daughter works in [place] as a Government social research officer in a Web designer.  There is no tobacco use.  Carl [name] drinks alcohol.  Family History:  The patient's family history includes Colon cancer in his paternal grandfather.  Mother died at age 67 with emphysema.  His father died at 64.  Carl [name] has 2 sisters ages 80 and 16.  ROS General: Negative; No fevers, chills, or night sweats;  HEENT: Decreased hearing with hearing aid in right ear, no sinus congestion, difficulty swallowing Pulmonary: Negative; No cough, wheezing, shortness of breath, hemoptysis Cardiovascular: Negative; No chest pain, presyncope, syncope, palpitations GI: History of diverticular disease and colonic polyps. GU: Remote history of genital herpes Musculoskeletal: Negative; no myalgias, joint pain, or weakness Hematologic/Oncology: Negative; no easy bruising, bleeding Endocrine: Negative; no heat/cold intolerance; no diabetes Neuro: Negative; no changes in balance, headaches Skin: Negative; No rashes or skin lesions Psychiatric:  Sleep: Negative; No snoring,  daytime sleepiness, hypersomnolence, bruxism, restless legs, hypnogognic hallucinations, no cataplexy Other comprehensive 14 point system review is negative.   PHYSICAL EXAM:   VS:  BP 112/62   Pulse 62   Ht 5' 8" (1.727 m)   Wt 188 lb (85.3 kg)   BMI 28.59 kg/m     Repeat blood pressure by me was 120/64 , Wt Readings from Last 3 Encounters:  04/02/18 188 lb (85.3 kg)  02/15/18 186 lb 1.1 oz (84.4 kg)  01/18/18 184 lb (83.5 kg)    General: Alert, oriented, no distress.  Skin: normal turgor, no rashes, warm and dry HEENT: Normocephalic, atraumatic. Pupils equal round and reactive to light; sclera anicteric; extraocular muscles intact; wears a hearing aid Nose without nasal septal hypertrophy Mouth/Parynx benign; Mallinpatti scale 3 Neck: No JVD, no carotid bruits; normal carotid upstroke Lungs: clear to ausculatation and percussion; no wheezing or rales Chest wall: Sternotomy site well-healed and essentially camouflaged by his chest care; without tenderness to palpitation Heart: PMI not displaced, RRR, s1 s2 normal, 1/6 systolic murmur, no diastolic murmur, no rubs, gallops, thrills, or heaves Abdomen: soft, nontender; no hepatosplenomehaly, BS+; abdominal aorta nontender and not dilated by palpation. Back: no CVA tenderness Pulses 2+ Musculoskeletal: full range of motion, normal strength, no joint deformities Extremities: no clubbing cyanosis or edema, Homan's sign negative  Neurologic: grossly nonfocal; Cranial nerves grossly  wnl Psychologic: Normal mood and affect    Studies/Labs Reviewed:   EKG:  EKG is ordered today.  ECG (independently read by me): NSR 62; Nonspecific T change; QTc 452 msec  December 29, 2017 ECG (independently read by me): Sinus bradycardia at 49 bpm.  Nonspecific ST changes.  QTc interval 464 ms.  No ectopy.  ECG (independently read by me): Sinus rhythm at 64 bpm.  Right bundle branch block with repolarization changes.  No ectopy.  October 24, 2017 ECG (independently read by me): Normal sinus rhythm at 62 bpm.  Right bundle branch block with repolarization changes.   ECG  April 18, 2014 revealed normal sinus rhythm.  Right bundle branch block was not present.  Recent Labs: BMP Latest Ref Rng & Units 04/03/2018 01/02/2018 11/20/2017  Glucose 65 - 99 mg/dL 95 100(H) 99  BUN 8 - 27 mg/dL _0 Creatinine 0.76 - 1.27 mg/dL 1.12 0.91 0.84  BUN/Creat Ratio 10 - _1 -  Sodium 134 - 144 mmol/L 139 137 134(L)  Potassium 3.5 - 5.2 mmol/L 4.5 5.0 3.8  Chloride 96 - 106 mmol/L 100 99 101  CO2 20 - 29 mmol/L _2 Calcium 8.6 - 10.2 mg/dL 9.2 9.2 8.5(L)     Hepatic Function Latest Ref Rng & Units 04/03/2018 02/01/2018 01/02/2018  Total Protein 6.0 - 8.5 g/dL 6.8 6.2 6.3  Albumin 3.7 - 4.7 g/dL 4.3 3.8 4.1  AST 0 - 40 IU/L 32 62(H) 46(H)  ALT 0 - 44 IU/L 32 75(H) 57(H)  Alk Phosphatase 39 - 117 IU/L 183(H) 249(H) 139(H)  Total Bilirubin 0.0 - 1.2 mg/dL 0.4 0.3 0.4  Bilirubin, Direct 0.00 - 0.40 mg/dL - 0.15 -    CBC Latest Ref Rng & Units 04/03/2018 01/02/2018 11/20/2017  WBC 3.4 - 10.8 x10E3/uL 6.0 6.2 12.8(H)  Hemoglobin 13.0 - 17.7 g/dL 14.9 13.3 10.1(L)  Hematocrit 37.5 - 51.0 % 44.9 40.1 31.2(L)  Platelets 150 - 450 x10E3/uL 243 250 240   Lab Results  Component Value Date   MCV 91 04/03/2018   MCV 98 (H) 01/02/2018   MCV 101.0 (H) 11/20/2017   Lab Results  Component Value Date   TSH 50.700 (H) 04/03/2018   Lab Results  Component Value Date   HGBA1C 5.4 11/15/2017     BNP No results found for: BNP  ProBNP No results found for: PROBNP   Lipid Panel     Component Value Date/Time   CHOL 180 04/03/2018 0824   TRIG 293 (H) 04/03/2018 0824   HDL 36 (L) 04/03/2018 0824   CHOLHDL 5.0 04/03/2018 0824   CHOLHDL 3.4 01/29/2007 0410   VLDL 17 01/29/2007 0410   LDLCALC 85 04/03/2018 0824     RADIOLOGY: No results found.   Additional studies/ records that were reviewed today include:  I reviewed  his previous evaluation with me from April 18, 2014.   ASSESSMENT:    1. PAF (paroxysmal atrial fibrillation) ([place])   2. Medication management   3. Essential hypertension   4. CAD in native artery   5. S/P CABG x 4   6. Hypothyroidism, unspecified type   7. Elevated LFTs   8. 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  Carl [name] has a history of mild hyperlipidemia and had been on simvastatin 40 mg daily and has been followed by Dr. Lennette Bihari  Little.  Carl [name] denied any history of chest tightness or pressure.  In the fall 2019 Carl [name] began to notice  slight increase in shortness of breath particularly with walking up steps.  When I saw him for initial evaluation an echo Doppler study revealed hyperdynamic LV function with an EF of 65 to 70% but with grade 2 diastolic dysfunction and abnormal tissue Doppler.  His CT coronary angiogram was suggestive of significant multivessel CAD and was FFR positive.  Carl [name] was found to have significant multivessel CAD at catheterization leading to urgent CABG revascularization surgery the following day which was successfully done by Dr. Roxy Horseman.  His postoperative course was complicated by paroxysmal atrial fibrillation for which Carl [name] was started on amiodarone and ultimately was discharged on Eliquis.  When I saw him for initial post hospital evaluation Carl [name] was still on amiodarone 200 mg twice a day, metoprolol tartrate 25 mg twice a day.  His ECG today showed sinus bradycardia and Carl [name] is now 5 weeks status post CABG revascularization.  That evaluation I decreased amiodarone to 200 mg daily and recommended ultimate titration to and discontinuance over the next 1 to 2 months.  Subsequent laboratory showed elevation of LFTs and further elevation of TSH.  His amiodarone was discontinued.  Levothyroxine dose was increased with a TSH of 19.66 and Carl [name] now has been taking 75 mcg.  Carl [name] feels more energy.  Carl [name] denies  chest pain.  Carl [name] is now participating in cardiac rehab as well as going to the gym on other days.  His breathing is better.  Carl [name] denies exertional shortness of breath.  Carl [name] denies any recurrence of arrhythmia.  I have recommended fasting laboratory be obtained.  We will contact him later this week once his lab work is complete to for adjustments in his medical regimen.  When his LFTs were elevated rosuvastatin dose was reduced.  Carl [name] has continued to be on Eliquis 5 mg.  Carl [name] tells me Carl [name] will be going to [place] in April.  Has had issues with erectile function and remotely had taken medication.  These have not been represcribed presently.  I will see him in 6 months for reevaluation or sooner if problems arise.   Medication Adjustments/Labs and Tests Ordered: Current medicines are reviewed at length with the patient today.  Concerns regarding medicines are outlined above.  Medication changes, Labs and Tests ordered today are listed in the Patient Instructions below. Patient Instructions  Medication Instructions:  The current medical regimen is effective;  continue present plan and medications.  If you need a refill on your cardiac medications before your next appointment, please call your pharmacy.   Lab work: Fasting work tomorrow (CMET, CBC, LIPID, TSH) If you have labs (blood work) drawn today and your tests are completely normal, you will receive your results only by: Marland Kitchen MyChart Message (if you have MyChart) OR . A paper copy in the mail If you have any lab test that is abnormal or we need to change your treatment, we will call you to review the results.   Follow-Up: At [facility], you and your health needs are our priority.  As part of our continuing mission to provide you with exceptional heart care, we have created designated Provider Care Teams.  These Care Teams include your primary Cardiologist (physician) and Advanced Practice Providers (APPs -  Physician Assistants and Nurse  Practitioners) who all work together to provide you with the care you need, when you need it. You will need a  follow up appointment in 6 months.  Please call our office 2 months in advance to schedule this appointment.  You may see Shelva Majestic, MD or one of the following Advanced Practice Providers on your designated Care Team: [place], [place] . Fabian Sharp, PA-C       Signed, Shelva Majestic, MD  04/04/2018 1:25 PM    [facility] [address] [place], [place], [place]  [address] Phone: [phone]

## 2018-04-02 NOTE — Patient Instructions (Signed)
Medication Instructions:  The current medical regimen is effective;  continue present plan and medications.  If you need a refill on your cardiac medications before your next appointment, please call your pharmacy.   Lab work: Fasting work tomorrow (CMET, CBC, LIPID, TSH) If you have labs (blood work) drawn today and your tests are completely normal, you will receive your results only by: Marland Kitchen MyChart Message (if you have MyChart) OR . A paper copy in the mail If you have any lab test that is abnormal or we need to change your treatment, we will call you to review the results.   Follow-Up: At [facility], you and your health needs are our priority.  As part of our continuing mission to provide you with exceptional heart care, we have created designated Provider Care Teams.  These Care Teams include your primary Cardiologist (physician) and Advanced Practice Providers (APPs -  Physician Assistants and Nurse Practitioners) who all work together to provide you with the care you need, when you need it. You will need a follow up appointment in 6 months.  Please call our office 2 months in advance to schedule this appointment.  You may see Shelva Majestic, MD or one of the following Advanced Practice Providers on your designated Care Team: [place], [place] . Fabian Sharp, PA-C

## 2018-04-03 DIAGNOSIS — Z79899 Other long term (current) drug therapy: Secondary | ICD-10-CM | POA: Diagnosis not present

## 2018-04-03 DIAGNOSIS — I10 Essential (primary) hypertension: Secondary | ICD-10-CM | POA: Diagnosis not present

## 2018-04-03 DIAGNOSIS — I480 Paroxysmal atrial fibrillation: Secondary | ICD-10-CM | POA: Diagnosis not present

## 2018-04-03 LAB — LIPID PANEL
Chol/HDL Ratio: 5 ratio (ref 0.0–5.0)
Cholesterol, Total: 180 mg/dL (ref 100–199)
HDL: 36 mg/dL — ABNORMAL LOW
LDL Calculated: 85 mg/dL (ref 0–99)
Triglycerides: 293 mg/dL — ABNORMAL HIGH (ref 0–149)
VLDL Cholesterol Cal: 59 mg/dL — ABNORMAL HIGH (ref 5–40)

## 2018-04-03 LAB — COMPREHENSIVE METABOLIC PANEL WITH GFR
ALT: 32 IU/L (ref 0–44)
AST: 32 IU/L (ref 0–40)
Albumin/Globulin Ratio: 1.7 (ref 1.2–2.2)
Albumin: 4.3 g/dL (ref 3.7–4.7)
Alkaline Phosphatase: 183 IU/L — ABNORMAL HIGH (ref 39–117)
BUN/Creatinine Ratio: 13 (ref 10–24)
BUN: 14 mg/dL (ref 8–27)
Bilirubin Total: 0.4 mg/dL (ref 0.0–1.2)
CO2: 24 mmol/L (ref 20–29)
Calcium: 9.2 mg/dL (ref 8.6–10.2)
Chloride: 100 mmol/L (ref 96–106)
Creatinine, Ser: 1.12 mg/dL (ref 0.76–1.27)
GFR calc Af Amer: 75 mL/min/1.73
GFR calc non Af Amer: 65 mL/min/1.73
Globulin, Total: 2.5 g/dL (ref 1.5–4.5)
Glucose: 95 mg/dL (ref 65–99)
Potassium: 4.5 mmol/L (ref 3.5–5.2)
Sodium: 139 mmol/L (ref 134–144)
Total Protein: 6.8 g/dL (ref 6.0–8.5)

## 2018-04-03 LAB — CBC
Hematocrit: 44.9 % (ref 37.5–51.0)
Hemoglobin: 14.9 g/dL (ref 13.0–17.7)
MCH: 30.2 pg (ref 26.6–33.0)
MCHC: 33.2 g/dL (ref 31.5–35.7)
MCV: 91 fL (ref 79–97)
Platelets: 243 x10E3/uL (ref 150–450)
RBC: 4.94 x10E6/uL (ref 4.14–5.80)
RDW: 14.6 % (ref 11.6–15.4)
WBC: 6 x10E3/uL (ref 3.4–10.8)

## 2018-04-03 LAB — TSH: TSH: 50.7 u[IU]/mL — ABNORMAL HIGH (ref 0.450–4.500)

## 2018-04-04 ENCOUNTER — Encounter: Payer: Self-pay | Admitting: Cardiovascular Disease

## 2018-04-04 ENCOUNTER — Ambulatory Visit (HOSPITAL_COMMUNITY): Payer: PPO

## 2018-04-04 ENCOUNTER — Encounter (HOSPITAL_COMMUNITY)
Admission: RE | Admit: 2018-04-04 | Discharge: 2018-04-04 | Disposition: A | Discharge status: Home / Self Care | Payer: PPO | Source: Ambulatory Visit | Attending: Cardiovascular Disease | Admitting: Cardiovascular Disease

## 2018-04-04 DIAGNOSIS — Z951 Presence of aortocoronary bypass graft: Secondary | ICD-10-CM | POA: Diagnosis not present

## 2018-04-05 MED ORDER — ICOSAPENT ETHYL 1 G PO CAPS: 2.0000 g | ORAL_CAPSULE | Freq: Two times a day (BID) | ORAL | 1 refills | Status: DC

## 2018-04-05 MED ORDER — LEVOTHYROXINE SODIUM 100 MCG PO TABS: 100.0000 ug | ORAL_TABLET | Freq: Every day | ORAL | 2 refills | Status: DC

## 2018-04-05 MED ORDER — ROSUVASTATIN CALCIUM 20 MG PO TABS: 20.0000 mg | ORAL_TABLET | Freq: Every day | ORAL | 2 refills | Status: DC

## 2018-04-05 NOTE — Addendum Note (Signed)
Addended by: Caprice Beaver T on: 04/05/2018 10:11 AM   Modules accepted: Orders

## 2018-04-06 ENCOUNTER — Encounter (HOSPITAL_COMMUNITY)
Admission: RE | Admit: 2018-04-06 | Discharge: 2018-04-06 | Disposition: A | Discharge status: Home / Self Care | Payer: PPO | Source: Ambulatory Visit | Attending: Cardiovascular Disease | Admitting: Cardiovascular Disease

## 2018-04-06 ENCOUNTER — Ambulatory Visit (HOSPITAL_COMMUNITY): Payer: PPO

## 2018-04-06 DIAGNOSIS — Z951 Presence of aortocoronary bypass graft: Secondary | ICD-10-CM

## 2018-04-06 NOTE — Progress Notes (Signed)
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  acetaminophen (TYLENOL) 325 MG tablet, Take 2 tablets (650 mg total) by mouth every 6 (six) hours as needed for mild pain., Disp: , Rfl:  .  acyclovir (ZOVIRAX) 400 MG tablet, Take 400 mg by mouth 2 (two) times daily., Disp: , Rfl:  .  apixaban (ELIQUIS) 5 MG TABS tablet, Take 1 tablet (5 mg total) by mouth 2 (two) times daily., Disp: 60 tablet, Rfl: 6 .  aspirin EC 81 MG tablet, Take 1 tablet (81 mg total) by mouth daily., Disp: 90 tablet, Rfl: 3 .  citalopram (CELEXA) 20 MG tablet, Take 20 mg by mouth daily., Disp: , Rfl:  .  Collagen-Boron-Hyaluronic Acid ([employer] JOINT HEALTH TRIPLE ACTION PO), Take 1 tablet by mouth daily., Disp: , Rfl:  .  hydrochlorothiazide (MICROZIDE) 12.5 MG capsule, Take 1 capsule (12.5 mg total) by mouth as needed (swelling)., Disp: 30 capsule, Rfl: 3 .  Icosapent Ethyl (VASCEPA) 1 g CAPS, Take 2 capsules (2 g total) by mouth 2 (two) times daily., Disp: 360 capsule, Rfl: 1 .  levothyroxine (SYNTHROID, LEVOTHROID) 100 MCG tablet, Take 1 tablet (100 mcg total) by mouth daily., Disp: 90 tablet, Rfl: 2 .  Magnesium Carbonate POWD, Take 4 g by mouth daily. Natural Vitality Calm, Disp: , Rfl:  .  metoprolol tartrate (LOPRESSOR) 25 MG tablet, Take 1 tablet (25 mg total) by mouth 2 (two) times daily., Disp: 180 tablet, Rfl: 3 .  Multiple Vitamins-Minerals (PRESERVISION AREDS 2 PO), Take 1 tablet by mouth 2 (two) times daily., Disp: , Rfl:   .  pantoprazole (PROTONIX) 20 MG tablet, TAKE 2 TABLETS(40 MG) BY MOUTH DAILY, Disp: 8 tablet, Rfl: 0 .  rosuvastatin (CRESTOR) 20 MG tablet, Take 1 tablet (20 mg total) by mouth daily., Disp: 90 tablet, Rfl: 2 .  zolpidem (AMBIEN) 10 MG tablet, Take 10 mg by mouth at bedtime. , Disp: , Rfl:   Past Medical History: Past Medical History:  Diagnosis Date  . Anxiety    takes Xanax daily as needed  . Arthritis   . Cataract    removed both eyes  . Depression    takes Celexa daily  . Diverticulitis   . Diverticulosis   . Enlarged prostate    sightly  . Esophagitis   . Gastric ulcer   . GERD (gastroesophageal reflux disease)    takes Protonix daily  . Hx of adenomatous colonic polyps    benign  . Hyperlipidemia    takes Zocor daily  . Hypertension   . Insomnia    takes Ambien nightly  . Joint pain   . Joint swelling     Tobacco Use: Social History   Tobacco Use  Smoking Status Never Smoker  Smokeless Tobacco Never Used    Labs: Recent Review Flowsheet Data    Labs for ITP Cardiac and Pulmonary Rehab Latest Ref Rng & Units 11/16/2017 11/16/2017 11/17/2017 01/02/2018 04/03/2018   Cholestrol 100 -  199 mg/dL - - - 107 180   LDLCALC 0 - 99 mg/dL - - - 39 85   HDL >39 mg/dL - - - 44 36(L)   Trlycerides 0 - 149 mg/dL - - - 120 293(H)   Hemoglobin A1c 4.8 - 5.6 % - - - - -   PHART 7.350 - 7.450 7.334(L) - - - -   PCO2ART 32.0 - 48.0 mmHg 38.7 - - - -   HCO3 20.0 - 28.0 mmol/L 20.8 - - - -   TCO2 22 - 32 mmol/L 22 23 23  - -   ACIDBASEDEF 0.0 - 2.0 mmol/L 5.0(H) - - - -   O2SAT % 98.0 - - - -      Capillary Blood Glucose: Lab Results  Component Value Date   GLUCAP 110 (H) 11/22/2017   GLUCAP 103 (H) 11/20/2017   GLUCAP 105 (H) 11/20/2017   GLUCAP 101 (H) 11/20/2017   GLUCAP 97 11/20/2017     Exercise Target Goals: Exercise Program Goal: Individual exercise prescription set using results from initial 6 min walk test and THRR while considering  patient's activity  barriers and safety.   Exercise Prescription Goal: Initial exercise prescription builds to 30-45 minutes a day of aerobic activity, 2-3 days per week.  Home exercise guidelines will be given to patient during program as part of exercise prescription that the participant will acknowledge.  Activity Barriers & Risk Stratification: Activity Barriers & Cardiac Risk Stratification - 02/15/18 1147      Activity Barriers & Cardiac Risk Stratification   Activity Barriers  None    Cardiac Risk Stratification  High       6 Minute Walk: 6 Minute Walk    Row Name 02/15/18 1144         6 Minute Walk   Phase  Initial     Distance  1648 feet     Walk Time  6 minutes     # of Rest Breaks  0     MPH  3.12     METS  3.27     RPE  11     Perceived Dyspnea   0     VO2 Peak  11.45     Symptoms  No     Resting HR  54 bpm     Resting BP  122/64     Resting Oxygen Saturation   98 %     Exercise Oxygen Saturation  during 6 min walk  98 %     Max Ex. HR  98 bpm     Max Ex. BP  128/60     2 Minute Post BP  118/72        Oxygen Initial Assessment:   Oxygen Re-Evaluation:   Oxygen Discharge (Final Oxygen Re-Evaluation):   Initial Exercise Prescription: Initial Exercise Prescription - 02/15/18 1100      Date of Initial Exercise RX and Referring Provider   Date  02/15/18    Referring Provider  Troy Sine MD     Expected Discharge Date  05/25/18      Bike   Level  0.8    Minutes  10    METs  2.78      NuStep   Level  2    SPM  85    Minutes  10    METs  3      Track   Laps  13    Minutes  10  METs  3.3      Prescription Details   Frequency (times per week)  3x    Duration  Progress to 30 minutes of continuous aerobic without signs/symptoms of physical distress      Intensity   THRR 40-80% of Max Heartrate  59-118    Ratings of Perceived Exertion  11-13    Perceived Dyspnea  0-4      Progression   Progression  Continue progressive overload as per policy  without signs/symptoms or physical distress.      Resistance Training   Training Prescription  Yes    Weight  3lbs    Reps  10-15       Perform Capillary Blood Glucose checks as needed.  Exercise Prescription Changes:  Exercise Prescription Changes    Row Name 02/19/18 [id number] 03/05/18 0951 03/21/18 0952 04/02/18 0954       Response to Exercise   Blood Pressure (Admit)  120/60  148/78  130/76  122/70    Blood Pressure (Exercise)  148/78  118/68  140/68  140/64    Blood Pressure (Exit)  124/60  130/74  102/70  120/72    Heart Rate (Admit)  67 bpm  65 bpm  71 bpm  75 bpm    Heart Rate (Exercise)  104 bpm  77 bpm  108 bpm  111 bpm    Heart Rate (Exit)  67 bpm  62 bpm  71 bpm  85 bpm    Rating of Perceived Exertion (Exercise)  13  13  13  13     Symptoms  none  none  none  none    Duration  Progress to 30 minutes of  aerobic without signs/symptoms of physical distress  Progress to 30 minutes of  aerobic without signs/symptoms of physical distress  Progress to 30 minutes of  aerobic without signs/symptoms of physical distress  Progress to 30 minutes of  aerobic without signs/symptoms of physical distress    Intensity  THRR unchanged  THRR unchanged  THRR unchanged  THRR unchanged      Progression   Progression  Continue to progress workloads to maintain intensity without signs/symptoms of physical distress.  Continue to progress workloads to maintain intensity without signs/symptoms of physical distress.  Continue to progress workloads to maintain intensity without signs/symptoms of physical distress.  Continue to progress workloads to maintain intensity without signs/symptoms of physical distress.    Average METs  2.9  3.1  3.4  3.9      Resistance Training   Training Prescription  Yes  Yes  No Relaxation day, no weights.  No No weights today, out early.    Weight  3lbs  4lbs  -  -    Reps  10-15  10-15  -  -    Time  10 Minutes  10 Minutes  -  -      Interval Training   Interval  Training  No  No  No  No      Bike   Level  0.8  3 Switched from Airdyne to upright [employer] bike  4 Scifit bike  5 Scifit bike    Watts  -  -  -  72    Minutes  10  10  10  10     METs  2.81  3.4  -  4.69      NuStep   Level  2  3  4  5     SPM  85  85  85  85    Minutes  10  10  10  10     METs  -  2.2  3.1  3.3      Track   Laps  12  16  16  16     Minutes  10  10  10  10     METs  3.07  3.78  3.78  3.78      Home Exercise Plan   Plans to continue exercise at  -  Home (comment) Walking 30 minutes, 2-3 days/week at home.  Home (comment) Walking 30 minutes, 2-3 days/week at home.  Home (comment) Walking 30 minutes, 2-3 days/week at home.    Frequency  -  Add 3 additional days to program exercise sessions.  Add 3 additional days to program exercise sessions.  Add 3 additional days to program exercise sessions.    Initial Home Exercises Provided  -  02/28/18  02/28/18  02/28/18       Exercise Comments:  Exercise Comments    Row Name 02/19/18 1050 02/28/18 1012 03/05/18 1009 03/21/18 1015 03/26/18 1000   Exercise Comments  Patien tolerated 1st session of execise fairly well, slightly unsteady gait with ambulation.  Reviewed home exercise guidelines, METs, and goals with patient.  Reviewed METs with patient.  Reviewed METs and goals with patient.  Paitent will begin exercise at [employer] as his mode of exercise on the days that he doesn't attend cardiac rehab.   [place] Name 04/02/18 1025           Exercise Comments  METs reviewed with patient.          Exercise Goals and Review:  Exercise Goals    Row Name 02/15/18 1146             Exercise Goals   Increase Physical Activity  Yes       Intervention  Provide advice, education, support and counseling about physical activity/exercise needs.;Develop an individualized exercise prescription for aerobic and resistive training based on initial evaluation findings, risk stratification, comorbidities and participant's personal goals.        Expected Outcomes  Short Term: Attend rehab on a regular basis to increase amount of physical activity.;Long Term: Add in home exercise to make exercise part of routine and to increase amount of physical activity.;Long Term: Exercising regularly at least 3-5 days a week.       Increase Strength and Stamina  Yes       Intervention  Provide advice, education, support and counseling about physical activity/exercise needs.;Develop an individualized exercise prescription for aerobic and resistive training based on initial evaluation findings, risk stratification, comorbidities and participant's personal goals.       Expected Outcomes  Short Term: Increase workloads from initial exercise prescription for resistance, speed, and METs.;Short Term: Perform resistance training exercises routinely during rehab and add in resistance training at home;Long Term: Improve cardiorespiratory fitness, muscular endurance and strength as measured by increased METs and functional capacity (6MWT)       Able to understand and use rate of perceived exertion (RPE) scale  Yes       Intervention  Provide education and explanation on how to use RPE scale       Expected Outcomes  Short Term: Able to use RPE daily in rehab to express subjective intensity level;Long Term:  Able to use RPE to guide intensity level when exercising independently       Knowledge and understanding of Target Heart Rate Range (  THRR)  Yes       Intervention  Provide education and explanation of THRR including how the numbers were predicted and where they are located for reference       Expected Outcomes  Short Term: Able to state/look up THRR;Short Term: Able to use daily as guideline for intensity in rehab;Long Term: Able to use THRR to govern intensity when exercising independently       Able to check pulse independently  Yes       Intervention  Provide education and demonstration on how to check pulse in carotid and radial arteries.;Review the importance  of being able to check your own pulse for safety during independent exercise       Expected Outcomes  Short Term: Able to explain why pulse checking is important during independent exercise;Long Term: Able to check pulse independently and accurately       Understanding of Exercise Prescription  Yes       Intervention  Provide education, explanation, and written materials on patient's individual exercise prescription       Expected Outcomes  Short Term: Able to explain program exercise prescription;Long Term: Able to explain home exercise prescription to exercise independently          Exercise Goals Re-Evaluation : Exercise Goals Re-Evaluation    Row Name 02/19/18 1050 02/28/18 1012 03/21/18 1015         Exercise Goal Re-Evaluation   Exercise Goals Review  Increase Physical Activity;Able to understand and use rate of perceived exertion (RPE) scale  Increase Physical Activity;Able to understand and use rate of perceived exertion (RPE) scale;Knowledge and understanding of Target Heart Rate Range (THRR);Understanding of Exercise Prescription;Able to check pulse independently  Increase Physical Activity;Able to understand and use rate of perceived exertion (RPE) scale;Knowledge and understanding of Target Heart Rate Range (THRR);Understanding of Exercise Prescription;Able to check pulse independently;Increase Strength and Stamina     Comments  Patient able to understand and use RPE scale appropriately.  Reviewed home exercise guidelines with patient including THRR, RPE scale, and endpoints for exercise. Pt is walking at least 30 minutes, 2-3 days/week in addition to exercise at cardiac rehab. Pt has a heart rate monitor that he's using to check his pulse.  Patient states he's doing some walking at home, approximately 30 minutes. Encouraged patient to increased hand weight size at cardiac rehab to help increase strength.     Expected Outcomes  Increase workloads as tolerated to help improve  cardiorespiratory fitness.  Pt will continue to exercise 30 minutes at least 5-7 day/week to help achieve personal health and fitness goals.  Patient will walk at least 30 minutes 2-3 days/week at home in addition to exercise at cardiac rehab, as well as increasing hand weights to help increase strength and stamina.        Discharge Exercise Prescription (Final Exercise Prescription Changes): Exercise Prescription Changes - 04/02/18 0954      Response to Exercise   Blood Pressure (Admit)  122/70    Blood Pressure (Exercise)  140/64    Blood Pressure (Exit)  120/72    Heart Rate (Admit)  75 bpm    Heart Rate (Exercise)  111 bpm    Heart Rate (Exit)  85 bpm    Rating of Perceived Exertion (Exercise)  13    Symptoms  none    Duration  Progress to 30 minutes of  aerobic without signs/symptoms of physical distress    Intensity  THRR unchanged  Progression   Progression  Continue to progress workloads to maintain intensity without signs/symptoms of physical distress.    Average METs  3.9      Resistance Training   Training Prescription  No   No weights today, out early.     Interval Training   Interval Training  No      Bike   Level  5   Scifit bike   Watts  72    Minutes  10    METs  4.69      NuStep   Level  5    SPM  85    Minutes  10    METs  3.3      Track   Laps  16    Minutes  10    METs  3.78      Home Exercise Plan   Plans to continue exercise at  Home (comment)   Walking 30 minutes, 2-3 days/week at home.   Frequency  Add 3 additional days to program exercise sessions.    Initial Home Exercises Provided  02/28/18       Nutrition:  Target Goals: Understanding of nutrition guidelines, daily intake of sodium 1500mg , cholesterol 200mg , calories 30% from fat and 7% or less from saturated fats, daily to have 5 or more servings of fruits and vegetables.  Biometrics: Pre Biometrics - 02/15/18 1146      Pre Biometrics   Waist Circumference  41 inches     Hip Circumference  41.5 inches    Waist to Hip Ratio  0.99 %    Triceps Skinfold  10 mm    % Body Fat  26.8 %    Grip Strength  36 kg    Flexibility  13 in    Single Leg Stand  6.64 seconds        Nutrition Therapy Plan and Nutrition Goals: Nutrition Therapy & Goals - 02/26/18 1041      Nutrition Therapy   Diet  heart healthy      Personal Nutrition Goals   Nutrition Goal  Pt to identify and limit food sources of saturated fat, trans fat, refined carbohydrates and sodium    Personal Goal #2  Pt to identify food quantities necessary to achieve weight loss of 6-24 lb at graduation from cardiac rehab.       Intervention Plan   Intervention  Prescribe, educate and counsel regarding individualized specific dietary modifications aiming towards targeted core components such as weight, hypertension, lipid management, diabetes, heart failure and other comorbidities.    Expected Outcomes  Short Term Goal: Understand basic principles of dietary content, such as calories, fat, sodium, cholesterol and nutrients.;Long Term Goal: Adherence to prescribed nutrition plan.       Nutrition Assessments: Nutrition Assessments - 02/15/18 1538      MEDFICTS Scores   Pre Score  24       Nutrition Goals Re-Evaluation:   Nutrition Goals Re-Evaluation:   Nutrition Goals Discharge (Final Nutrition Goals Re-Evaluation):   Psychosocial: Target Goals: Acknowledge presence or absence of significant depression and/or stress, maximize coping skills, provide positive support system. Participant is able to verbalize types and ability to use techniques and skills needed for reducing stress and depression.  Initial Review & Psychosocial Screening: Initial Psych Review & Screening - 02/15/18 1050      Initial Review   Current issues with  None Identified      Family Dynamics   Good Support System?  Yes  Josip lists his girlfriend as a source of support.     Barriers   Psychosocial barriers to  participate in program  There are no identifiable barriers or psychosocial needs.      Screening Interventions   Interventions  Encouraged to exercise       Quality of Life Scores: Quality of Life - 02/15/18 1049      Quality of Life   Select  Quality of Life      Quality of Life Scores   Health/Function Pre  26 %    Socioeconomic Pre  28.93 %    Psych/Spiritual Pre  28.07 %    Family Pre  28.5 %    GLOBAL Pre  27.66 %      Scores of 19 and below usually indicate a poorer quality of life in these areas.  A difference of  2-3 points is a clinically meaningful difference.  A difference of 2-3 points in the total score of the Quality of Life Index has been associated with significant improvement in overall quality of life, self-image, physical symptoms, and general health in studies assessing change in quality of life.  PHQ-9: Recent Review Flowsheet Data    Depression screen [facility] 2/9 02/19/2018 12/14/2017   Decreased Interest 0 0   Down, Depressed, Hopeless 0 0   PHQ - 2 Score 0 0     Interpretation of Total Score  Total Score Depression Severity:  1-4 = Minimal depression, 5-9 = Mild depression, 10-14 = Moderate depression, 15-19 = Moderately severe depression, 20-27 = Severe depression   Psychosocial Evaluation and Intervention:   Psychosocial Re-Evaluation: Psychosocial Re-Evaluation    Row Name 03/15/18 0841 04/06/18 1727           Psychosocial Re-Evaluation   Current issues with  None Identified  None Identified      Interventions  Encouraged to attend Cardiac Rehabilitation for the exercise  Encouraged to attend Cardiac Rehabilitation for the exercise      Continue Psychosocial Services   No Follow up required  No Follow up required         Psychosocial Discharge (Final Psychosocial Re-Evaluation): Psychosocial Re-Evaluation - 04/06/18 1727      Psychosocial Re-Evaluation   Current issues with  None Identified    Interventions  Encouraged to attend Cardiac  Rehabilitation for the exercise    Continue Psychosocial Services   No Follow up required       Vocational Rehabilitation: Provide vocational rehab assistance to qualifying candidates.   Vocational Rehab Evaluation & Intervention: Vocational Rehab - 02/15/18 1049      Initial Vocational Rehab Evaluation & Intervention   Assessment shows need for Vocational Rehabilitation  No       Education: Education Goals: Education classes will be provided on a weekly basis, covering required topics. Participant will state understanding/return demonstration of topics presented.  Learning Barriers/Preferences: Learning Barriers/Preferences - 02/15/18 1142      Learning Barriers/Preferences   Learning Barriers  Hearing;Sight    Learning Preferences  Verbal Instruction;Written Material;Skilled Demonstration       Education Topics: Count Your Pulse:  -Group instruction provided by verbal instruction, demonstration, patient participation and written materials to support subject.  Instructors address importance of being able to find your pulse and how to count your pulse when at home without a heart monitor.  Patients get hands on experience counting their pulse with staff help and individually.   Heart Attack, Angina, and Risk Factor Modification:  -Group instruction  provided by verbal instruction, video, and written materials to support subject.  Instructors address signs and symptoms of angina and heart attacks.    Also discuss risk factors for heart disease and how to make changes to improve heart health risk factors.   Functional Fitness:  -Group instruction provided by verbal instruction, demonstration, patient participation, and written materials to support subject.  Instructors address safety measures for doing things around the house.  Discuss how to get up and down off the floor, how to pick things up properly, how to safely get out of a chair without assistance, and balance training.    CARDIAC REHAB PHASE II EXERCISE from 03/16/2018 in [facility]  Date  02/23/18  Educator  EP  Instruction Review Code  2- Demonstrated Understanding      Meditation and Mindfulness:  -Group instruction provided by verbal instruction, patient participation, and written materials to support subject.  Instructor addresses importance of mindfulness and meditation practice to help reduce stress and improve awareness.  Instructor also leads participants through a meditation exercise.    Stretching for Flexibility and Mobility:  -Group instruction provided by verbal instruction, patient participation, and written materials to support subject.  Instructors lead participants through series of stretches that are designed to increase flexibility thus improving mobility.  These stretches are additional exercise for major muscle groups that are typically performed during regular warm up and cool down.   Hands Only CPR:  -Group verbal, video, and participation provides a basic overview of AHA guidelines for community CPR. Role-play of emergencies allow participants the opportunity to practice calling for help and chest compression technique with discussion of AED use.   Hypertension: -Group verbal and written instruction that provides a basic overview of hypertension including the most recent diagnostic guidelines, risk factor reduction with self-care instructions and medication management.   CARDIAC REHAB PHASE II EXERCISE from 03/16/2018 in [place]  Date  03/16/18  Educator  RN  Instruction Review Code  2- Demonstrated Understanding       Nutrition I class: Heart Healthy Eating:  -Group instruction provided by PowerPoint slides, verbal discussion, and written materials to support subject matter. The instructor gives an explanation and review of the Therapeutic Lifestyle Changes diet recommendations, which includes a discussion on lipid  goals, dietary fat, sodium, fiber, plant stanol/sterol esters, sugar, and the components of a well-balanced, healthy diet.   Nutrition II class: Lifestyle Skills:  -Group instruction provided by PowerPoint slides, verbal discussion, and written materials to support subject matter. The instructor gives an explanation and review of label reading, grocery shopping for heart health, heart healthy recipe modifications, and ways to make healthier choices when eating out.   Diabetes Question & Answer:  -Group instruction provided by PowerPoint slides, verbal discussion, and written materials to support subject matter. The instructor gives an explanation and review of diabetes co-morbidities, pre- and post-prandial blood glucose goals, pre-exercise blood glucose goals, signs, symptoms, and treatment of hypoglycemia and hyperglycemia, and foot care basics.   Diabetes Blitz:  -Group instruction provided by PowerPoint slides, verbal discussion, and written materials to support subject matter. The instructor gives an explanation and review of the physiology behind type 1 and type 2 diabetes, diabetes medications and rational behind using different medications, pre- and post-prandial blood glucose recommendations and Hemoglobin A1c goals, diabetes diet, and exercise including blood glucose guidelines for exercising safely.    Portion Distortion:  -Group instruction provided by PowerPoint slides, verbal  discussion, written materials, and food models to support subject matter. The instructor gives an explanation of serving size versus portion size, changes in portions sizes over the last 20 years, and what consists of a serving from each food group.   Stress Management:  -Group instruction provided by verbal instruction, video, and written materials to support subject matter.  Instructors review role of stress in heart disease and how to cope with stress positively.     Exercising on Your Own:  -Group  instruction provided by verbal instruction, power point, and written materials to support subject.  Instructors discuss benefits of exercise, components of exercise, frequency and intensity of exercise, and end points for exercise.  Also discuss use of nitroglycerin and activating EMS.  Review options of places to exercise outside of rehab.  Review guidelines for sex with heart disease.   Cardiac Drugs I:  -Group instruction provided by verbal instruction and written materials to support subject.  Instructor reviews cardiac drug classes: antiplatelets, anticoagulants, beta blockers, and statins.  Instructor discusses reasons, side effects, and lifestyle considerations for each drug class.   Cardiac Drugs II:  -Group instruction provided by verbal instruction and written materials to support subject.  Instructor reviews cardiac drug classes: angiotensin converting enzyme inhibitors (ACE-I), angiotensin II receptor blockers (ARBs), nitrates, and calcium channel blockers.  Instructor discusses reasons, side effects, and lifestyle considerations for each drug class.   Anatomy and Physiology of the Circulatory System:  Group verbal and written instruction and models provide basic cardiac anatomy and physiology, with the coronary electrical and arterial systems. Review of: AMI, Angina, Valve disease, Heart Failure, Peripheral Artery Disease, Cardiac Arrhythmia, Pacemakers, and the ICD.   Other Education:  -Group or individual verbal, written, or video instructions that support the educational goals of the cardiac rehab program.   Holiday Eating Survival Tips:  -Group instruction provided by PowerPoint slides, verbal discussion, and written materials to support subject matter. The instructor gives patients tips, tricks, and techniques to help them not only survive but enjoy the holidays despite the onslaught of food that accompanies the holidays.   Knowledge Questionnaire Score: Knowledge  Questionnaire Score - 02/15/18 1049      Knowledge Questionnaire Score   Pre Score  19/24       Core Components/Risk Factors/Patient Goals at Admission: Personal Goals and Risk Factors at Admission - 02/15/18 1143      Core Components/Risk Factors/Patient Goals on Admission    Weight Management  Yes;Weight Loss    Intervention  Weight Management: Develop a combined nutrition and exercise program designed to reach desired caloric intake, while maintaining appropriate intake of nutrient and fiber, sodium and fats, and appropriate energy expenditure required for the weight goal.;Weight Management: Provide education and appropriate resources to help participant work on and attain dietary goals.;Weight Management/Obesity: Establish reasonable short term and long term weight goals.    Admit Weight  186 lb 1.1 oz (84.4 kg)    Goal Weight: Short Term  180 lb (81.6 kg)    Goal Weight: Long Term  176 lb (79.8 kg)    Expected Outcomes  Short Term: Continue to assess and modify interventions until short term weight is achieved;Long Term: Adherence to nutrition and physical activity/exercise program aimed toward attainment of established weight goal;Weight Loss: Understanding of general recommendations for a balanced deficit meal plan, which promotes 1-2 lb weight loss per week and includes a negative energy balance of [phone] kcal/d;Understanding recommendations for meals to include 15-35% energy as protein, 25-35%  energy from fat, 35-60% energy from carbohydrates, less than 200mg  of dietary cholesterol, 20-35 gm of total fiber daily;Understanding of distribution of calorie intake throughout the day with the consumption of 4-5 meals/snacks    Hypertension  Yes    Intervention  Provide education on lifestyle modifcations including regular physical activity/exercise, weight management, moderate sodium restriction and increased consumption of fresh fruit, vegetables, and low fat dairy, alcohol moderation, and  smoking cessation.;Monitor prescription use compliance.    Expected Outcomes  Short Term: Continued assessment and intervention until BP is < 140/33mm HG in hypertensive participants. < 130/71mm HG in hypertensive participants with diabetes, heart failure or chronic kidney disease.;Long Term: Maintenance of blood pressure at goal levels.    Lipids  Yes    Intervention  Provide education and support for participant on nutrition & aerobic/resistive exercise along with prescribed medications to achieve LDL 70mg , HDL >40mg .    Expected Outcomes  Short Term: Participant states understanding of desired cholesterol values and is compliant with medications prescribed. Participant is following exercise prescription and nutrition guidelines.;Long Term: Cholesterol controlled with medications as prescribed, with individualized exercise RX and with personalized nutrition plan. Value goals: LDL < 70mg , HDL > 40 mg.    Stress  Yes    Intervention  Offer individual and/or small group education and counseling on adjustment to heart disease, stress management and health-related lifestyle change. Teach and support self-help strategies.;Refer participants experiencing significant psychosocial distress to appropriate mental health specialists for further evaluation and treatment. When possible, include family members and significant others in education/counseling sessions.    Expected Outcomes  Short Term: Participant demonstrates changes in health-related behavior, relaxation and other stress management skills, ability to obtain effective social support, and compliance with psychotropic medications if prescribed.;Long Term: Emotional wellbeing is indicated by absence of clinically significant psychosocial distress or social isolation.       Core Components/Risk Factors/Patient Goals Review:  Goals and Risk Factor Review    Row Name 03/15/18 0842 04/06/18 1727           Core Components/Risk Factors/Patient Goals  Review   Personal Goals Review  Weight Management/Obesity;Hypertension;Stress;Lipids  Weight Management/Obesity;Hypertension;Stress;Lipids      Review  Biff is doing well with exercise. Jaycion's vital signs and weights have been stable  Newman is doing well with exercise. Leyton's vital signs and weights have been stable      Expected Outcomes  Shayn will continue to partcipate in phase 2 cardiac rehab for exercise nutrition and lifestyle modifications  Darwyn will continue to partcipate in phase 2 cardiac rehab for exercise nutrition and lifestyle modifications         Core Components/Risk Factors/Patient Goals at Discharge (Final Review):  Goals and Risk Factor Review - 04/06/18 1727      Core Components/Risk Factors/Patient Goals Review   Personal Goals Review  Weight Management/Obesity;Hypertension;Stress;Lipids    Review  Chloe is doing well with exercise. Barrie's vital signs and weights have been stable    Expected Outcomes  [place] will continue to partcipate in phase 2 cardiac rehab for exercise nutrition and lifestyle modifications       ITP Comments: ITP Comments    Row Name 02/15/18 1054 03/15/18 0846 04/06/18 1726       ITP Comments  Dr. Fransico Him, Medical Director  30 Day ITP Review. Patient is with good participation and attendance in phase 2 cardiac rehab  30 Day ITP Review. Patient is with good participation and attendance in phase 2 cardiac rehab  Comments: See ITP comments.Barnet Pall, RN,BSN 04/12/2018 12:28 PM

## 2018-04-09 ENCOUNTER — Ambulatory Visit (HOSPITAL_COMMUNITY): Payer: PPO

## 2018-04-09 ENCOUNTER — Encounter (HOSPITAL_COMMUNITY)
Admission: RE | Admit: 2018-04-09 | Discharge: 2018-04-09 | Disposition: A | Discharge status: Home / Self Care | Payer: PPO | Source: Ambulatory Visit | Attending: Cardiovascular Disease | Admitting: Cardiovascular Disease

## 2018-04-09 DIAGNOSIS — Z951 Presence of aortocoronary bypass graft: Secondary | ICD-10-CM | POA: Insufficient documentation

## 2018-04-11 ENCOUNTER — Encounter (HOSPITAL_COMMUNITY)
Admission: RE | Admit: 2018-04-11 | Discharge: 2018-04-11 | Disposition: A | Discharge status: Home / Self Care | Payer: PPO | Source: Ambulatory Visit | Attending: Cardiovascular Disease | Admitting: Cardiovascular Disease

## 2018-04-11 ENCOUNTER — Ambulatory Visit (HOSPITAL_COMMUNITY): Payer: PPO

## 2018-04-11 DIAGNOSIS — Z951 Presence of aortocoronary bypass graft: Secondary | ICD-10-CM

## 2018-04-13 ENCOUNTER — Ambulatory Visit (HOSPITAL_COMMUNITY): Payer: PPO

## 2018-04-13 ENCOUNTER — Encounter (HOSPITAL_COMMUNITY)
Admission: RE | Admit: 2018-04-13 | Discharge: 2018-04-13 | Disposition: A | Discharge status: Home / Self Care | Payer: PPO | Source: Ambulatory Visit | Attending: Cardiovascular Disease | Admitting: Cardiovascular Disease

## 2018-04-13 DIAGNOSIS — Z951 Presence of aortocoronary bypass graft: Secondary | ICD-10-CM | POA: Diagnosis not present

## 2018-04-16 ENCOUNTER — Ambulatory Visit (HOSPITAL_COMMUNITY): Payer: PPO

## 2018-04-16 ENCOUNTER — Encounter (HOSPITAL_COMMUNITY)
Admission: RE | Admit: 2018-04-16 | Discharge: 2018-04-16 | Disposition: A | Discharge status: Home / Self Care | Payer: PPO | Source: Ambulatory Visit | Attending: Cardiovascular Disease | Admitting: Cardiovascular Disease

## 2018-04-16 DIAGNOSIS — Z951 Presence of aortocoronary bypass graft: Secondary | ICD-10-CM

## 2018-04-18 ENCOUNTER — Other Ambulatory Visit: Payer: Self-pay

## 2018-04-18 ENCOUNTER — Encounter (HOSPITAL_COMMUNITY)
Admission: RE | Admit: 2018-04-18 | Discharge: 2018-04-18 | Disposition: A | Discharge status: Home / Self Care | Payer: PPO | Source: Ambulatory Visit | Attending: Cardiovascular Disease | Admitting: Cardiovascular Disease

## 2018-04-18 ENCOUNTER — Ambulatory Visit (HOSPITAL_COMMUNITY): Payer: PPO

## 2018-04-18 DIAGNOSIS — Z951 Presence of aortocoronary bypass graft: Secondary | ICD-10-CM | POA: Diagnosis not present

## 2018-04-20 ENCOUNTER — Other Ambulatory Visit: Payer: Self-pay

## 2018-04-20 ENCOUNTER — Encounter (HOSPITAL_COMMUNITY)
Admission: RE | Admit: 2018-04-20 | Discharge: 2018-04-20 | Disposition: A | Discharge status: Home / Self Care | Payer: PPO | Source: Ambulatory Visit | Attending: Cardiovascular Disease | Admitting: Cardiovascular Disease

## 2018-04-20 ENCOUNTER — Encounter (HOSPITAL_COMMUNITY): Payer: PPO

## 2018-04-20 ENCOUNTER — Ambulatory Visit (HOSPITAL_COMMUNITY): Payer: PPO

## 2018-04-20 DIAGNOSIS — Z951 Presence of aortocoronary bypass graft: Secondary | ICD-10-CM

## 2018-04-23 ENCOUNTER — Encounter (HOSPITAL_COMMUNITY): Payer: PPO

## 2018-04-23 ENCOUNTER — Ambulatory Visit (HOSPITAL_COMMUNITY): Payer: PPO

## 2018-04-23 ENCOUNTER — Telehealth (HOSPITAL_COMMUNITY): Payer: Self-pay | Admitting: Cardiac Rehabilitation

## 2018-04-23 NOTE — Telephone Encounter (Signed)
Phone call to patient to notify of [facility] Phase II departmental closing for 2 weeks.  Pt verbalized understanding.  Joann Rion, RN, BSN Cardiac Pulmonary Rehab  

## 2018-04-25 ENCOUNTER — Encounter (HOSPITAL_COMMUNITY): Payer: PPO

## 2018-04-25 ENCOUNTER — Ambulatory Visit (HOSPITAL_COMMUNITY): Payer: PPO

## 2018-04-26 ENCOUNTER — Encounter (HOSPITAL_COMMUNITY): Payer: Self-pay | Admitting: *Deleted

## 2018-04-26 DIAGNOSIS — Z951 Presence of aortocoronary bypass graft: Secondary | ICD-10-CM

## 2018-04-27 ENCOUNTER — Encounter (HOSPITAL_COMMUNITY): Payer: PPO

## 2018-04-27 ENCOUNTER — Ambulatory Visit (HOSPITAL_COMMUNITY): Payer: PPO

## 2018-04-30 ENCOUNTER — Other Ambulatory Visit: Payer: Self-pay | Admitting: Internal Medicine

## 2018-04-30 ENCOUNTER — Encounter (HOSPITAL_COMMUNITY): Payer: PPO

## 2018-04-30 ENCOUNTER — Ambulatory Visit (HOSPITAL_COMMUNITY): Payer: PPO

## 2018-05-01 ENCOUNTER — Encounter (HOSPITAL_COMMUNITY): Payer: Self-pay | Admitting: *Deleted

## 2018-05-01 ENCOUNTER — Telehealth (HOSPITAL_COMMUNITY): Payer: Self-pay | Admitting: *Deleted

## 2018-05-01 DIAGNOSIS — Z951 Presence of aortocoronary bypass graft: Secondary | ICD-10-CM

## 2018-05-01 NOTE — Progress Notes (Signed)
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  acetaminophen (TYLENOL) 325 MG tablet, Take 2 tablets (650 mg total) by mouth every 6 (six) hours as needed for mild pain., Disp: , Rfl:  .  acyclovir (ZOVIRAX) 400 MG tablet, Take 400 mg by mouth 2 (two) times daily., Disp: , Rfl:  .  apixaban (ELIQUIS) 5 MG TABS tablet, Take 1 tablet (5 mg total) by mouth 2 (two) times daily., Disp: 60 tablet, Rfl: 6 .  aspirin EC 81 MG tablet, Take 1 tablet (81 mg total) by mouth daily., Disp: 90 tablet, Rfl: 3 .  citalopram (CELEXA) 20 MG tablet, Take 20 mg by mouth daily., Disp: , Rfl:  .  Collagen-Boron-Hyaluronic Acid ([employer] JOINT HEALTH TRIPLE ACTION PO), Take 1 tablet by mouth daily., Disp: , Rfl:  .  hydrochlorothiazide (MICROZIDE) 12.5 MG capsule, Take 1 capsule (12.5 mg total) by mouth as needed (swelling)., Disp: 30 capsule, Rfl: 3 .  Icosapent Ethyl (VASCEPA) 1 g CAPS, Take 2 capsules (2 g total) by mouth 2 (two) times daily., Disp: 360 capsule, Rfl: 1 .  levothyroxine (SYNTHROID, LEVOTHROID) 100 MCG tablet, Take 1 tablet (100 mcg total) by mouth daily., Disp: 90 tablet, Rfl: 2 .  Magnesium Carbonate POWD, Take 4 g by mouth daily. Natural Vitality Calm, Disp: , Rfl:  .  metoprolol tartrate (LOPRESSOR) 25 MG tablet, Take 1 tablet (25 mg total) by mouth 2 (two) times daily., Disp: 180 tablet, Rfl: 3 .  Multiple Vitamins-Minerals (PRESERVISION AREDS 2 PO), Take 1 tablet by mouth 2 (two) times daily., Disp: , Rfl:   .  pantoprazole (PROTONIX) 20 MG tablet, TAKE 2 TABLETS(40 MG) BY MOUTH DAILY, Disp: 180 tablet, Rfl: 3 .  rosuvastatin (CRESTOR) 20 MG tablet, Take 1 tablet (20 mg total) by mouth daily., Disp: 90 tablet, Rfl: 2 .  zolpidem (AMBIEN) 10 MG tablet, Take 10 mg by mouth at bedtime. , Disp: , Rfl:   Past Medical History: Past Medical History:  Diagnosis Date  . Anxiety    takes Xanax daily as needed  . Arthritis   . Cataract    removed both eyes  . Depression    takes Celexa daily  . Diverticulitis   . Diverticulosis   . Enlarged prostate    sightly  . Esophagitis   . Gastric ulcer   . GERD (gastroesophageal reflux disease)    takes Protonix daily  . Hx of adenomatous colonic polyps    benign  . Hyperlipidemia    takes Zocor daily  . Hypertension   . Insomnia    takes Ambien nightly  . Joint pain   . Joint swelling     Tobacco Use: Social History   Tobacco Use  Smoking Status Never Smoker  Smokeless Tobacco Never Used    Labs: Recent Review Flowsheet Data    Labs for ITP Cardiac and Pulmonary Rehab Latest Ref Rng & Units 11/16/2017 11/16/2017 11/17/2017 01/02/2018 04/03/2018   Cholestrol 100 -  199 mg/dL - - - 107 180   LDLCALC 0 - 99 mg/dL - - - 39 85   HDL >39 mg/dL - - - 44 36(L)   Trlycerides 0 - 149 mg/dL - - - 120 293(H)   Hemoglobin A1c 4.8 - 5.6 % - - - - -   PHART 7.350 - 7.450 7.334(L) - - - -   PCO2ART 32.0 - 48.0 mmHg 38.7 - - - -   HCO3 20.0 - 28.0 mmol/L 20.8 - - - -   TCO2 22 - 32 mmol/L _0 - -   ACIDBASEDEF 0.0 - 2.0 mmol/L 5.0(H) - - - -   O2SAT % 98.0 - - - -      Capillary Blood Glucose: Lab Results  Component Value Date   GLUCAP 110 (H) 11/22/2017   GLUCAP 103 (H) 11/20/2017   GLUCAP 105 (H) 11/20/2017   GLUCAP 101 (H) 11/20/2017   GLUCAP 97 11/20/2017     Exercise Target Goals: Exercise Program Goal: Individual exercise prescription set using results from initial 6 min walk test and THRR while considering  patient's  activity barriers and safety.   Exercise Prescription Goal: Initial exercise prescription builds to 30-45 minutes a day of aerobic activity, 2-3 days per week.  Home exercise guidelines will be given to patient during program as part of exercise prescription that the participant will acknowledge.  Activity Barriers & Risk Stratification:   6 Minute Walk:   Oxygen Initial Assessment:   Oxygen Re-Evaluation:   Oxygen Discharge (Final Oxygen Re-Evaluation):   Initial Exercise Prescription:   Perform Capillary Blood Glucose checks as needed.  Exercise Prescription Changes: Exercise Prescription Changes    Row Name 03/05/18 [phone] 03/21/18 0952 04/02/18 0954 04/16/18 0956 04/20/18 0952     Response to Exercise   Blood Pressure (Admit)  148/78  130/76  122/70  120/70  124/66   Blood Pressure (Exercise)  118/68  140/68  140/64  156/82  124/70   Blood Pressure (Exit)  130/74  102/70  120/72  120/80  120/80   Heart Rate (Admit)  65 bpm  71 bpm  75 bpm  61 bpm  80 bpm   Heart Rate (Exercise)  77 bpm  108 bpm  111 bpm  107 bpm  127 bpm   Heart Rate (Exit)  62 bpm  71 bpm  85 bpm  69 bpm  80 bpm   Rating of Perceived Exertion (Exercise)  _1 Symptoms  none  none  none  none  none   Duration  Progress to 30 minutes of  aerobic without signs/symptoms of physical distress  Progress to 30 minutes of  aerobic without signs/symptoms of physical distress  Progress to 30 minutes of  aerobic without signs/symptoms of physical distress  Progress to 30 minutes of  aerobic without signs/symptoms of physical distress  Progress to 30 minutes of  aerobic without signs/symptoms of physical distress   Intensity  THRR unchanged  THRR unchanged  THRR unchanged  THRR unchanged  THRR unchanged     Progression   Progression  Continue to progress workloads to maintain intensity without signs/symptoms of physical distress.  Continue to progress workloads to maintain intensity without  signs/symptoms of physical distress.  Continue to progress workloads to maintain intensity without signs/symptoms of physical distress.  Continue to progress workloads to maintain intensity without signs/symptoms of physical distress.  Continue to progress workloads to maintain intensity  without signs/symptoms of physical distress.   Average METs  3.1  3.4  3.9  4.2  4.5     Resistance Training   Training Prescription  Yes  No Relaxation day, no weights.  No No weights today, out early.  Yes  Yes   Weight  4lbs  -  -  4lbs lt hand, 5lbs rt hand  4lbs lt hand, 5lbs rt hand   Reps  10-15  -  -  10-15  10-15   Time  10 Minutes  -  -  10 Minutes  10 Minutes     Interval Training   Interval Training  No  No  No  No  No     Bike   Level  3 Switched from Airdyne to upright Sci[employer] bike  4 Scifit bike  5 Scifit bike  5 Scifit bike  5 Scifit bike   Watts  -  -  72  82  100   Minutes  _0 METs  3.4  -  4.69  4.97  5.6     NuStep   Level  _1 SPM  85  85  85  85  85   Minutes  _2 METs  2.2  3.1  3.3  3.4  3.7     Track   Laps  _3 Minutes  _4 METs  3.78  3.78  3.78  4.3  4.13     Home Exercise Plan   Plans to continue exercise at  Home (comment) Walking 30 minutes, 2-3 days/week at home.  Home (comment) Walking 30 minutes, 2-3 days/week at home.  Home (comment) Walking 30 minutes, 2-3 days/week at home.  Home (comment) Walking 30 minutes, 2-3 days/week at home.  Home (comment) Walking 30 minutes, 2-3 days/week at home.   Frequency  Add 3 additional days to program exercise sessions.  Add 3 additional days to program exercise sessions.  Add 3 additional days to program exercise sessions.  Add 3 additional days to program exercise sessions.  Add 3 additional days to program exercise sessions.   Initial Home Exercises Provided  02/28/18  02/28/18  02/28/18  02/28/18  02/28/18      Exercise Comments: Exercise  Comments    Row Name 03/05/18 1009 03/21/18 1015 03/26/18 1000 04/02/18 1025 04/16/18 1040   Exercise Comments  Reviewed METs with patient.  Reviewed METs and goals with patient.  Paitent will begin exercise at [employer] as his mode of exercise on the days that he doesn't attend cardiac rehab.  METs reviewed with patient.  Reviewed METs and goals with patient.      Exercise Goals and Review:   Exercise Goals Re-Evaluation : Exercise Goals Re-Evaluation    Row Name 03/21/18 1015 04/16/18 1040 04/25/18 1107         Exercise Goal Re-Evaluation   Exercise Goals Review  Increase Physical Activity;Able to understand and use rate of perceived exertion (RPE) scale;Knowledge and understanding of Target Heart Rate Range (THRR);Understanding of Exercise Prescription;Able to check pulse independently;Increase Strength and Stamina  Increase Physical Activity;Able to understand and use rate of perceived exertion (RPE) scale;Knowledge and understanding of Target Heart Rate Range (THRR);Understanding of Exercise Prescription;Able to check  pulse independently;Increase Strength and Stamina  -     Comments  Patient states he's doing some walking at home, approximately 30 minutes. Encouraged patient to increased hand weight size at cardiac rehab to help increase strength.  Patient is making great progress with exercise. Pt is exercising at least 3 days/week in addition to exercise at cardiac rehab. Pt has started resistance training with a personal trainer on Tuesdays and Thursday and biking at gym, 60 minutes, 2 days/week. Pt's current MET level is 4.1  Temporary department closure due to COVID-19.     Expected Outcomes  Patient will walk at least 30 minutes 2-3 days/week at home in addition to exercise at cardiac rehab, as well as increasing hand weights to help increase strength and stamina.  Patient will continue exercise 6-7 days/week to achieve personal health and fitness goals.  -        Discharge Exercise  Prescription (Final Exercise Prescription Changes): Exercise Prescription Changes - 04/20/18 0952      Response to Exercise   Blood Pressure (Admit)  124/66    Blood Pressure (Exercise)  124/70    Blood Pressure (Exit)  120/80    Heart Rate (Admit)  80 bpm    Heart Rate (Exercise)  127 bpm    Heart Rate (Exit)  80 bpm    Rating of Perceived Exertion (Exercise)  13    Symptoms  none    Duration  Progress to 30 minutes of  aerobic without signs/symptoms of physical distress    Intensity  THRR unchanged      Progression   Progression  Continue to progress workloads to maintain intensity without signs/symptoms of physical distress.    Average METs  4.5      Resistance Training   Training Prescription  Yes    Weight  4lbs lt hand, 5lbs rt hand    Reps  10-15    Time  10 Minutes      Interval Training   Interval Training  No      Bike   Level  5   Scifit bike   Watts  100    Minutes  10    METs  5.6      NuStep   Level  5    SPM  85    Minutes  10    METs  3.7      Track   Laps  18    Minutes  10    METs  4.13      Home Exercise Plan   Plans to continue exercise at  Home (comment)   Walking 30 minutes, 2-3 days/week at home.   Frequency  Add 3 additional days to program exercise sessions.    Initial Home Exercises Provided  02/28/18       Nutrition:  Target Goals: Understanding of nutrition guidelines, daily intake of sodium <1560m, cholesterol <2080m calories 30% from fat and 7% or less from saturated fats, daily to have 5 or more servings of fruits and vegetables.  Biometrics:    Nutrition Therapy Plan and Nutrition Goals:   Nutrition Assessments:   Nutrition Goals Re-Evaluation:   Nutrition Goals Re-Evaluation:   Nutrition Goals Discharge (Final Nutrition Goals Re-Evaluation):   Psychosocial: Target Goals: Acknowledge presence or absence of significant depression and/or stress, maximize coping skills, provide positive support system.  Participant is able to verbalize types and ability to use techniques and skills needed for reducing stress and depression.  Initial Review & Psychosocial  Screening:   Quality of Life Scores:  Scores of 19 and below usually indicate a poorer quality of life in these areas.  A difference of  2-3 points is a clinically meaningful difference.  A difference of 2-3 points in the total score of the Quality of Life Index has been associated with significant improvement in overall quality of life, self-image, physical symptoms, and general health in studies assessing change in quality of life.  PHQ-9: Recent Review Flowsheet Data    Depression screen [facility] 2/9 02/19/2018 12/14/2017   Decreased Interest 0 0   Down, Depressed, Hopeless 0 0   PHQ - 2 Score 0 0     Interpretation of Total Score  Total Score Depression Severity:  1-4 = Minimal depression, 5-9 = Mild depression, 10-14 = Moderate depression, 15-19 = Moderately severe depression, 20-27 = Severe depression   Psychosocial Evaluation and Intervention:   Psychosocial Re-Evaluation: Psychosocial Re-Evaluation    Collins Name 03/15/18 [phone] 04/06/18 1727 04/26/18 1353         Psychosocial Re-Evaluation   Current issues with  None Identified  None Identified  None Identified     Comments  -  -  unable to assess as exercise is currently on hold     Interventions  Encouraged to attend Cardiac Rehabilitation for the exercise  Encouraged to attend Cardiac Rehabilitation for the exercise  -     Continue Psychosocial Services   No Follow up required  No Follow up required  -        Psychosocial Discharge (Final Psychosocial Re-Evaluation): Psychosocial Re-Evaluation - 04/26/18 1353      Psychosocial Re-Evaluation   Current issues with  None Identified    Comments  unable to assess as exercise is currently on hold       Vocational Rehabilitation: Provide vocational rehab assistance to qualifying candidates.   Vocational Rehab Evaluation &  Intervention:   Education: Education Goals: Education classes will be provided on a weekly basis, covering required topics. Participant will state understanding/return demonstration of topics presented.  Learning Barriers/Preferences:   Education Topics: Count Your Pulse:  -Group instruction provided by verbal instruction, demonstration, patient participation and written materials to support subject.  Instructors address importance of being able to find your pulse and how to count your pulse when at home without a heart monitor.  Patients get hands on experience counting their pulse with staff help and individually.   Heart Attack, Angina, and Risk Factor Modification:  -Group instruction provided by verbal instruction, video, and written materials to support subject.  Instructors address signs and symptoms of angina and heart attacks.    Also discuss risk factors for heart disease and how to make changes to improve heart health risk factors.   Functional Fitness:  -Group instruction provided by verbal instruction, demonstration, patient participation, and written materials to support subject.  Instructors address safety measures for doing things around the house.  Discuss how to get up and down off the floor, how to pick things up properly, how to safely get out of a chair without assistance, and balance training.   CARDIAC REHAB PHASE II EXERCISE from 03/16/2018 in [facility]  Date  02/23/18  Educator  EP  Instruction Review Code  2- Demonstrated Understanding      Meditation and Mindfulness:  -Group instruction provided by verbal instruction, patient participation, and written materials to support subject.  Instructor addresses importance of mindfulness and meditation practice to help reduce stress  and improve awareness.  Instructor also leads participants through a meditation exercise.    Stretching for Flexibility and Mobility:  -Group instruction  provided by verbal instruction, patient participation, and written materials to support subject.  Instructors lead participants through series of stretches that are designed to increase flexibility thus improving mobility.  These stretches are additional exercise for major muscle groups that are typically performed during regular warm up and cool down.   Hands Only CPR:  -Group verbal, video, and participation provides a basic overview of AHA guidelines for community CPR. Role-play of emergencies allow participants the opportunity to practice calling for help and chest compression technique with discussion of AED use.   Hypertension: -Group verbal and written instruction that provides a basic overview of hypertension including the most recent diagnostic guidelines, risk factor reduction with self-care instructions and medication management.   CARDIAC REHAB PHASE II EXERCISE from 03/16/2018 in [facility]  Date  03/16/18  Educator  RN  Instruction Review Code  2- Demonstrated Understanding       Nutrition I class: Heart Healthy Eating:  -Group instruction provided by PowerPoint slides, verbal discussion, and written materials to support subject matter. The instructor gives an explanation and review of the Therapeutic Lifestyle Changes diet recommendations, which includes a discussion on lipid goals, dietary fat, sodium, fiber, plant stanol/sterol esters, sugar, and the components of a well-balanced, healthy diet.   Nutrition II class: Lifestyle Skills:  -Group instruction provided by PowerPoint slides, verbal discussion, and written materials to support subject matter. The instructor gives an explanation and review of label reading, grocery shopping for heart health, heart healthy recipe modifications, and ways to make healthier choices when eating out.   Diabetes Question & Answer:  -Group instruction provided by PowerPoint slides, verbal discussion, and written  materials to support subject matter. The instructor gives an explanation and review of diabetes co-morbidities, pre- and post-prandial blood glucose goals, pre-exercise blood glucose goals, signs, symptoms, and treatment of hypoglycemia and hyperglycemia, and foot care basics.   Diabetes Blitz:  -Group instruction provided by PowerPoint slides, verbal discussion, and written materials to support subject matter. The instructor gives an explanation and review of the physiology behind type 1 and type 2 diabetes, diabetes medications and rational behind using different medications, pre- and post-prandial blood glucose recommendations and Hemoglobin A1c goals, diabetes diet, and exercise including blood glucose guidelines for exercising safely.    Portion Distortion:  -Group instruction provided by PowerPoint slides, verbal discussion, written materials, and food models to support subject matter. The instructor gives an explanation of serving size versus portion size, changes in portions sizes over the last 20 years, and what consists of a serving from each food group.   Stress Management:  -Group instruction provided by verbal instruction, video, and written materials to support subject matter.  Instructors review role of stress in heart disease and how to cope with stress positively.     Exercising on Your Own:  -Group instruction provided by verbal instruction, power point, and written materials to support subject.  Instructors discuss benefits of exercise, components of exercise, frequency and intensity of exercise, and end points for exercise.  Also discuss use of nitroglycerin and activating EMS.  Review options of places to exercise outside of rehab.  Review guidelines for sex with heart disease.   Cardiac Drugs I:  -Group instruction provided by verbal instruction and written materials to support subject.  Instructor reviews cardiac drug classes: antiplatelets, anticoagulants, beta blockers,  and statins.  Instructor discusses reasons, side effects, and lifestyle considerations for each drug class.   Cardiac Drugs II:  -Group instruction provided by verbal instruction and written materials to support subject.  Instructor reviews cardiac drug classes: angiotensin converting enzyme inhibitors (ACE-I), angiotensin II receptor blockers (ARBs), nitrates, and calcium channel blockers.  Instructor discusses reasons, side effects, and lifestyle considerations for each drug class.   Anatomy and Physiology of the Circulatory System:  Group verbal and written instruction and models provide basic cardiac anatomy and physiology, with the coronary electrical and arterial systems. Review of: AMI, Angina, Valve disease, Heart Failure, Peripheral Artery Disease, Cardiac Arrhythmia, Pacemakers, and the ICD.   Other Education:  -Group or individual verbal, written, or video instructions that support the educational goals of the cardiac rehab program.   Holiday Eating Survival Tips:  -Group instruction provided by PowerPoint slides, verbal discussion, and written materials to support subject matter. The instructor gives patients tips, tricks, and techniques to help them not only survive but enjoy the holidays despite the onslaught of food that accompanies the holidays.   Knowledge Questionnaire Score:   Core Components/Risk Factors/Patient Goals at Admission:   Core Components/Risk Factors/Patient Goals Review:  Goals and Risk Factor Review    Row Name 03/15/18 0842 04/06/18 1727 04/26/18 1354         Core Components/Risk Factors/Patient Goals Review   Personal Goals Review  Weight Management/Obesity;Hypertension;Stress;Lipids  Weight Management/Obesity;Hypertension;Stress;Lipids  Weight Management/Obesity;Hypertension;Stress;Lipids     Review  Carl [name] is doing well with exercise. Carl [name]'s vital signs and weights have been stable  Carl [name] is doing well with exercise. Carl [name]'s vital signs and weights  have been stable   Exercise is currently on hold per recommended guidelines from the federal government to prevent the spread of COVID-19     Expected Outcomes  Carl [name] will continue to partcipate in phase 2 cardiac rehab for exercise nutrition and lifestyle modifications  Carl [name] will continue to partcipate in phase 2 cardiac rehab for exercise nutrition and lifestyle modifications  Carl [name] will continue to partcipate in phase 2 cardiac rehab for exercise nutrition and lifestyle modifications upon resuming exercise at cardiac rehab        Core Components/Risk Factors/Patient Goals at Discharge (Final Review):  Goals and Risk Factor Review - 04/26/18 1354      Core Components/Risk Factors/Patient Goals Review   Personal Goals Review  Weight Management/Obesity;Hypertension;Stress;Lipids    Review   Exercise is currently on hold per recommended guidelines from the federal government to prevent the spread of COVID-19    Expected Outcomes  Carl [name] will continue to partcipate in phase 2 cardiac rehab for exercise nutrition and lifestyle modifications upon resuming exercise at cardiac rehab       ITP Comments: ITP Comments    Row Name 03/15/18 0846 04/06/18 1726 04/26/18 1353       ITP Comments  30 Day ITP Review. Patient is with good participation and attendance in phase 2 cardiac rehab  30 Day ITP Review. Patient is with good participation and attendance in phase 2 cardiac rehab  30 Day ITP Review.  Exercise is currently on hold per recommended guidelines from the federal government to prevent the spread of COVID-19        Comments: See ITP comments.Barnet Pall, RN,BSN 05/01/2018 9:12 AM

## 2018-05-02 ENCOUNTER — Encounter (HOSPITAL_COMMUNITY): Payer: PPO

## 2018-05-02 ENCOUNTER — Ambulatory Visit (HOSPITAL_COMMUNITY): Payer: PPO

## 2018-05-04 ENCOUNTER — Encounter (HOSPITAL_COMMUNITY): Payer: PPO

## 2018-05-04 ENCOUNTER — Ambulatory Visit (HOSPITAL_COMMUNITY): Payer: PPO

## 2018-05-07 ENCOUNTER — Ambulatory Visit (HOSPITAL_COMMUNITY): Payer: PPO

## 2018-05-07 ENCOUNTER — Encounter (HOSPITAL_COMMUNITY): Payer: PPO

## 2018-05-09 ENCOUNTER — Ambulatory Visit (HOSPITAL_COMMUNITY): Payer: PPO

## 2018-05-09 ENCOUNTER — Encounter (HOSPITAL_COMMUNITY): Payer: PPO

## 2018-05-11 ENCOUNTER — Ambulatory Visit (HOSPITAL_COMMUNITY): Payer: PPO

## 2018-05-11 ENCOUNTER — Encounter (HOSPITAL_COMMUNITY): Payer: PPO

## 2018-05-14 ENCOUNTER — Ambulatory Visit (HOSPITAL_COMMUNITY): Payer: PPO

## 2018-05-14 ENCOUNTER — Encounter (HOSPITAL_COMMUNITY): Payer: PPO

## 2018-05-16 ENCOUNTER — Ambulatory Visit (HOSPITAL_COMMUNITY): Payer: PPO

## 2018-05-16 ENCOUNTER — Encounter (HOSPITAL_COMMUNITY): Payer: PPO

## 2018-05-16 ENCOUNTER — Telehealth (HOSPITAL_COMMUNITY): Payer: Self-pay | Admitting: *Deleted

## 2018-05-16 NOTE — Telephone Encounter (Signed)
Called to notify patient that the cardiac and pulmonary rehabilitation department will be closed temporarily due to COVID-19 restrictions. Pt verbalized understanding.  Patient is walking most days of the week as his mode of home exercise.  Sol Passer, MS, ACSM CEP 05/16/2018 1008

## 2018-05-18 ENCOUNTER — Ambulatory Visit (HOSPITAL_COMMUNITY): Payer: PPO

## 2018-05-18 ENCOUNTER — Encounter (HOSPITAL_COMMUNITY): Payer: PPO

## 2018-05-21 ENCOUNTER — Ambulatory Visit (HOSPITAL_COMMUNITY): Payer: PPO

## 2018-05-21 ENCOUNTER — Encounter (HOSPITAL_COMMUNITY): Payer: PPO

## 2018-05-23 ENCOUNTER — Encounter (HOSPITAL_COMMUNITY): Payer: PPO

## 2018-05-23 ENCOUNTER — Ambulatory Visit (HOSPITAL_COMMUNITY): Payer: PPO

## 2018-06-20 DIAGNOSIS — H353132 Nonexudative age-related macular degeneration, bilateral, intermediate dry stage: Secondary | ICD-10-CM | POA: Diagnosis not present

## 2018-06-20 DIAGNOSIS — Z961 Presence of intraocular lens: Secondary | ICD-10-CM | POA: Diagnosis not present

## 2018-06-20 DIAGNOSIS — G453 Amaurosis fugax: Secondary | ICD-10-CM | POA: Diagnosis not present

## 2018-06-20 DIAGNOSIS — H33321 Round hole, right eye: Secondary | ICD-10-CM | POA: Diagnosis not present

## 2018-06-22 ENCOUNTER — Other Ambulatory Visit: Payer: Self-pay | Admitting: Surgery

## 2018-06-22 DIAGNOSIS — G453 Amaurosis fugax: Secondary | ICD-10-CM

## 2018-06-25 DIAGNOSIS — L821 Other seborrheic keratosis: Secondary | ICD-10-CM | POA: Diagnosis not present

## 2018-06-25 DIAGNOSIS — L570 Actinic keratosis: Secondary | ICD-10-CM | POA: Diagnosis not present

## 2018-06-25 DIAGNOSIS — D225 Melanocytic nevi of trunk: Secondary | ICD-10-CM | POA: Diagnosis not present

## 2018-07-03 ENCOUNTER — Telehealth (HOSPITAL_COMMUNITY): Payer: Self-pay | Admitting: *Deleted

## 2018-07-04 ENCOUNTER — Other Ambulatory Visit: Payer: Self-pay

## 2018-07-04 ENCOUNTER — Ambulatory Visit
Admission: RE | Admit: 2018-07-04 | Discharge: 2018-07-04 | Disposition: A | Discharge status: Home / Self Care | Payer: PPO | Source: Ambulatory Visit | Attending: Surgery | Admitting: Surgery

## 2018-07-04 DIAGNOSIS — G453 Amaurosis fugax: Secondary | ICD-10-CM

## 2018-07-04 DIAGNOSIS — I6782 Cerebral ischemia: Secondary | ICD-10-CM | POA: Diagnosis not present

## 2018-07-04 MED ORDER — GADOBENATE DIMEGLUMINE 529 MG/ML IV SOLN
17.0000 mL | Freq: Once | INTRAVENOUS | Status: AC | PRN
  Administered 2018-07-04: 17 mL via INTRAVENOUS

## 2018-07-16 DIAGNOSIS — H6123 Impacted cerumen, bilateral: Secondary | ICD-10-CM | POA: Insufficient documentation

## 2018-07-18 ENCOUNTER — Encounter (INDEPENDENT_AMBULATORY_CARE_PROVIDER_SITE_OTHER): Payer: PPO | Admitting: Ophthalmology

## 2018-07-18 ENCOUNTER — Other Ambulatory Visit: Payer: Self-pay

## 2018-07-18 DIAGNOSIS — I10 Essential (primary) hypertension: Secondary | ICD-10-CM

## 2018-07-18 DIAGNOSIS — H4311 Vitreous hemorrhage, right eye: Secondary | ICD-10-CM | POA: Diagnosis not present

## 2018-07-18 DIAGNOSIS — H33301 Unspecified retinal break, right eye: Secondary | ICD-10-CM | POA: Diagnosis not present

## 2018-07-18 DIAGNOSIS — H353132 Nonexudative age-related macular degeneration, bilateral, intermediate dry stage: Secondary | ICD-10-CM

## 2018-07-18 DIAGNOSIS — H35033 Hypertensive retinopathy, bilateral: Secondary | ICD-10-CM

## 2018-07-18 DIAGNOSIS — H43813 Vitreous degeneration, bilateral: Secondary | ICD-10-CM | POA: Diagnosis not present

## 2018-07-31 DIAGNOSIS — I2510 Atherosclerotic heart disease of native coronary artery without angina pectoris: Secondary | ICD-10-CM | POA: Diagnosis not present

## 2018-07-31 DIAGNOSIS — G459 Transient cerebral ischemic attack, unspecified: Secondary | ICD-10-CM | POA: Diagnosis not present

## 2018-07-31 DIAGNOSIS — E032 Hypothyroidism due to medicaments and other exogenous substances: Secondary | ICD-10-CM | POA: Diagnosis not present

## 2018-07-31 DIAGNOSIS — I480 Paroxysmal atrial fibrillation: Secondary | ICD-10-CM | POA: Diagnosis not present

## 2018-07-31 DIAGNOSIS — E785 Hyperlipidemia, unspecified: Secondary | ICD-10-CM | POA: Diagnosis not present

## 2018-07-31 DIAGNOSIS — E782 Mixed hyperlipidemia: Secondary | ICD-10-CM | POA: Diagnosis not present

## 2018-07-31 DIAGNOSIS — I10 Essential (primary) hypertension: Secondary | ICD-10-CM | POA: Diagnosis not present

## 2018-08-01 ENCOUNTER — Encounter (INDEPENDENT_AMBULATORY_CARE_PROVIDER_SITE_OTHER): Payer: PPO | Admitting: Ophthalmology

## 2018-08-01 ENCOUNTER — Telehealth (HOSPITAL_COMMUNITY): Payer: Self-pay | Admitting: *Deleted

## 2018-08-01 ENCOUNTER — Other Ambulatory Visit: Payer: Self-pay

## 2018-08-01 DIAGNOSIS — H353112 Nonexudative age-related macular degeneration, right eye, intermediate dry stage: Secondary | ICD-10-CM

## 2018-08-01 DIAGNOSIS — H33301 Unspecified retinal break, right eye: Secondary | ICD-10-CM

## 2018-08-06 ENCOUNTER — Telehealth: Payer: Self-pay | Admitting: Cardiovascular Disease

## 2018-08-06 NOTE — Telephone Encounter (Signed)
Follow up   Ma

## 2018-08-06 NOTE — Telephone Encounter (Signed)
Okay to resume cardiac rehab.  However I believe this is only being held virtually and not onsite presently

## 2018-08-06 NOTE — Telephone Encounter (Signed)
New message   Verdis Frederickson would like a call back to see if this patient can resume cardiac rehab. Please call.

## 2018-08-07 ENCOUNTER — Telehealth (HOSPITAL_COMMUNITY): Payer: Self-pay

## 2018-08-07 NOTE — Telephone Encounter (Signed)
Called and spoke with pt in regards to rescheduling for CR, pt will come in on 08/08/2018 @ 9am, to pick up CR.

## 2018-08-07 NOTE — Telephone Encounter (Signed)
Message sent to cardiac rehab

## 2018-08-08 ENCOUNTER — Encounter (HOSPITAL_COMMUNITY)
Admission: RE | Admit: 2018-08-08 | Discharge: 2018-08-08 | Disposition: A | Discharge status: Home / Self Care | Payer: PPO | Source: Ambulatory Visit | Attending: Cardiovascular Disease | Admitting: Cardiovascular Disease

## 2018-08-08 ENCOUNTER — Ambulatory Visit (HOSPITAL_COMMUNITY): Payer: PPO

## 2018-08-08 ENCOUNTER — Other Ambulatory Visit: Payer: Self-pay

## 2018-08-08 DIAGNOSIS — Z951 Presence of aortocoronary bypass graft: Secondary | ICD-10-CM | POA: Insufficient documentation

## 2018-08-08 NOTE — Progress Notes (Signed)
Daily Session Note  Patient Details  Name: Carl [name] MRN: [id number] Date of Birth: October 31, 1945 Referring Provider:     CARDIAC REHAB PHASE II ORIENTATION from 02/15/2018 in [place]  Referring Provider  Troy Sine MD       Encounter Date: 08/08/2018  Check In: Session Check In - 08/08/18 0911      Check-In   Supervising physician immediately available to respond to emergencies  [facility] immediately available    Physician(s)  Dr. Berle Mull    Location  [facility]    Staff Present  Dorma Russell, MS,ACSM CEP, Exercise Physiologist;Brittany Durene Fruits, BS, ACSM CEP, Exercise Physiologist;Maria Whitaker, RN, Maxcine Ham, RN, BSN    Virtual Visit  No    Medication changes reported      No    Fall or balance concerns reported     No    Tobacco Cessation  No Change    Warm-up and Cool-down  Performed on first and last piece of equipment    Resistance Training Performed  Yes    VAD Patient?  No    PAD/SET Patient?  No      Pain Assessment   Currently in Pain?  No/denies    Pain Score  0-No pain    Multiple Pain Sites  No       Capillary Blood Glucose: No results found for this or any previous visit (from the past 24 hour(s)).    Social History   Tobacco Use  Smoking Status Never Smoker  Smokeless Tobacco Never Used    Goals Met:  No report of cardiac concerns or symptoms  Goals Unmet:  Not Applicable  Comments: Aseem returned to onsite cardiac rehab after departmental closure for COVID 19. Social distancing and safety measures are in place. Joven tolerated his first day of exercise without difficulty. Dillon did report some knee discomfort on the recumbent bike this went away once his seat was adjusted.Barnet Pall, RN,BSN 08/08/2018 10:32 AM   Dr. Fransico Him is Medical Director for Cardiac Rehab at [facility].

## 2018-08-13 ENCOUNTER — Encounter (HOSPITAL_COMMUNITY)
Admission: RE | Admit: 2018-08-13 | Discharge: 2018-08-13 | Disposition: A | Discharge status: Home / Self Care | Payer: PPO | Source: Ambulatory Visit | Attending: Cardiovascular Disease | Admitting: Cardiovascular Disease

## 2018-08-13 ENCOUNTER — Other Ambulatory Visit: Payer: Self-pay

## 2018-08-13 DIAGNOSIS — Z951 Presence of aortocoronary bypass graft: Secondary | ICD-10-CM | POA: Diagnosis not present

## 2018-08-15 ENCOUNTER — Encounter (HOSPITAL_COMMUNITY)
Admission: RE | Admit: 2018-08-15 | Discharge: 2018-08-15 | Disposition: A | Discharge status: Home / Self Care | Payer: PPO | Source: Ambulatory Visit | Attending: Cardiovascular Disease | Admitting: Cardiovascular Disease

## 2018-08-15 ENCOUNTER — Other Ambulatory Visit: Payer: Self-pay

## 2018-08-15 DIAGNOSIS — Z951 Presence of aortocoronary bypass graft: Secondary | ICD-10-CM | POA: Diagnosis not present

## 2018-08-17 ENCOUNTER — Other Ambulatory Visit: Payer: Self-pay

## 2018-08-17 ENCOUNTER — Encounter (HOSPITAL_COMMUNITY)
Admission: RE | Admit: 2018-08-17 | Discharge: 2018-08-17 | Disposition: A | Discharge status: Home / Self Care | Payer: PPO | Source: Ambulatory Visit | Attending: Cardiovascular Disease | Admitting: Cardiovascular Disease

## 2018-08-17 DIAGNOSIS — T8579XA Infection and inflammatory reaction due to other internal prosthetic devices, implants and grafts, initial encounter: Secondary | ICD-10-CM | POA: Diagnosis not present

## 2018-08-17 DIAGNOSIS — H33311 Horseshoe tear of retina without detachment, right eye: Secondary | ICD-10-CM | POA: Diagnosis not present

## 2018-08-17 DIAGNOSIS — Z951 Presence of aortocoronary bypass graft: Secondary | ICD-10-CM

## 2018-08-17 DIAGNOSIS — H353132 Nonexudative age-related macular degeneration, bilateral, intermediate dry stage: Secondary | ICD-10-CM | POA: Diagnosis not present

## 2018-08-17 DIAGNOSIS — Z961 Presence of intraocular lens: Secondary | ICD-10-CM | POA: Diagnosis not present

## 2018-08-20 ENCOUNTER — Other Ambulatory Visit: Payer: Self-pay

## 2018-08-20 ENCOUNTER — Encounter (HOSPITAL_COMMUNITY)
Admission: RE | Admit: 2018-08-20 | Discharge: 2018-08-20 | Disposition: A | Discharge status: Home / Self Care | Payer: PPO | Source: Ambulatory Visit | Attending: Cardiovascular Disease | Admitting: Cardiovascular Disease

## 2018-08-20 DIAGNOSIS — Z951 Presence of aortocoronary bypass graft: Secondary | ICD-10-CM | POA: Diagnosis not present

## 2018-08-22 ENCOUNTER — Encounter (HOSPITAL_COMMUNITY)
Admission: RE | Admit: 2018-08-22 | Discharge: 2018-08-22 | Disposition: A | Discharge status: Home / Self Care | Payer: PPO | Source: Ambulatory Visit | Attending: Cardiovascular Disease | Admitting: Cardiovascular Disease

## 2018-08-24 ENCOUNTER — Other Ambulatory Visit: Payer: Self-pay

## 2018-08-24 ENCOUNTER — Encounter (HOSPITAL_COMMUNITY)
Admission: RE | Admit: 2018-08-24 | Discharge: 2018-08-24 | Disposition: A | Discharge status: Home / Self Care | Payer: PPO | Source: Ambulatory Visit | Attending: Cardiovascular Disease | Admitting: Cardiovascular Disease

## 2018-08-24 DIAGNOSIS — Z951 Presence of aortocoronary bypass graft: Secondary | ICD-10-CM

## 2018-08-27 ENCOUNTER — Encounter (HOSPITAL_COMMUNITY)
Admission: RE | Admit: 2018-08-27 | Discharge: 2018-08-27 | Disposition: A | Discharge status: Home / Self Care | Payer: PPO | Source: Ambulatory Visit | Attending: Cardiovascular Disease | Admitting: Cardiovascular Disease

## 2018-08-27 ENCOUNTER — Other Ambulatory Visit: Payer: Self-pay

## 2018-08-27 DIAGNOSIS — Z951 Presence of aortocoronary bypass graft: Secondary | ICD-10-CM | POA: Diagnosis not present

## 2018-08-28 DIAGNOSIS — Z125 Encounter for screening for malignant neoplasm of prostate: Secondary | ICD-10-CM | POA: Diagnosis not present

## 2018-08-28 DIAGNOSIS — I10 Essential (primary) hypertension: Secondary | ICD-10-CM | POA: Diagnosis not present

## 2018-08-28 DIAGNOSIS — R946 Abnormal results of thyroid function studies: Secondary | ICD-10-CM | POA: Diagnosis not present

## 2018-08-28 DIAGNOSIS — R7989 Other specified abnormal findings of blood chemistry: Secondary | ICD-10-CM | POA: Diagnosis not present

## 2018-08-29 ENCOUNTER — Other Ambulatory Visit: Payer: Self-pay

## 2018-08-29 ENCOUNTER — Encounter (HOSPITAL_COMMUNITY)
Admission: RE | Admit: 2018-08-29 | Discharge: 2018-08-29 | Disposition: A | Discharge status: Home / Self Care | Payer: PPO | Source: Ambulatory Visit | Attending: Cardiovascular Disease | Admitting: Cardiovascular Disease

## 2018-08-29 DIAGNOSIS — Z951 Presence of aortocoronary bypass graft: Secondary | ICD-10-CM

## 2018-08-29 DIAGNOSIS — I10 Essential (primary) hypertension: Secondary | ICD-10-CM | POA: Diagnosis not present

## 2018-08-29 DIAGNOSIS — H409 Unspecified glaucoma: Secondary | ICD-10-CM | POA: Diagnosis not present

## 2018-08-29 DIAGNOSIS — L570 Actinic keratosis: Secondary | ICD-10-CM | POA: Diagnosis not present

## 2018-08-29 DIAGNOSIS — I2510 Atherosclerotic heart disease of native coronary artery without angina pectoris: Secondary | ICD-10-CM | POA: Diagnosis not present

## 2018-08-29 DIAGNOSIS — I480 Paroxysmal atrial fibrillation: Secondary | ICD-10-CM | POA: Diagnosis not present

## 2018-08-29 DIAGNOSIS — E032 Hypothyroidism due to medicaments and other exogenous substances: Secondary | ICD-10-CM | POA: Diagnosis not present

## 2018-08-29 DIAGNOSIS — Z8673 Personal history of transient ischemic attack (TIA), and cerebral infarction without residual deficits: Secondary | ICD-10-CM | POA: Diagnosis not present

## 2018-08-29 DIAGNOSIS — R748 Abnormal levels of other serum enzymes: Secondary | ICD-10-CM | POA: Diagnosis not present

## 2018-08-29 DIAGNOSIS — Z79899 Other long term (current) drug therapy: Secondary | ICD-10-CM | POA: Diagnosis not present

## 2018-08-29 DIAGNOSIS — Z86010 Personal history of colonic polyps: Secondary | ICD-10-CM | POA: Diagnosis not present

## 2018-08-29 DIAGNOSIS — E782 Mixed hyperlipidemia: Secondary | ICD-10-CM | POA: Diagnosis not present

## 2018-08-29 DIAGNOSIS — F419 Anxiety disorder, unspecified: Secondary | ICD-10-CM | POA: Diagnosis not present

## 2018-08-30 NOTE — Progress Notes (Signed)
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  acetaminophen (TYLENOL) 325 MG tablet, Take 2 tablets (650 mg total) by mouth every 6 (six) hours as needed for mild pain., Disp: , Rfl:  .  acyclovir (ZOVIRAX) 400 MG tablet, Take 400 mg by mouth 2 (two) times daily., Disp: , Rfl:  .  apixaban (ELIQUIS) 5 MG TABS tablet, Take 1 tablet (5 mg total) by mouth 2 (two) times daily., Disp: 60 tablet, Rfl: 6 .  aspirin EC 81 MG tablet, Take 1 tablet (81 mg total) by mouth daily., Disp: 90 tablet, Rfl: 3 .  citalopram (CELEXA) 20 MG tablet, Take 20 mg by mouth daily., Disp: , Rfl:  .  Collagen-Boron-Hyaluronic Acid ([employer] JOINT HEALTH TRIPLE ACTION PO), Take 1 tablet by mouth daily., Disp: , Rfl:  .  hydrochlorothiazide (MICROZIDE) 12.5 MG capsule, Take 1 capsule (12.5 mg total) by mouth as needed (swelling)., Disp: 30 capsule, Rfl: 3 .  Icosapent Ethyl (VASCEPA) 1 g CAPS, Take 2 capsules (2 g total) by mouth 2 (two) times daily., Disp: 360 capsule, Rfl: 1 .  levothyroxine (SYNTHROID, LEVOTHROID) 100 MCG tablet, Take 1 tablet (100 mcg total) by mouth daily., Disp: 90 tablet, Rfl: 2 .  Magnesium Carbonate POWD, Take 4 g by mouth daily. Natural Vitality Calm, Disp: , Rfl:  .  metoprolol tartrate (LOPRESSOR) 25 MG tablet, Take 1 tablet (25 mg total) by mouth 2 (two) times daily., Disp: 180 tablet, Rfl: 3 .  Multiple Vitamins-Minerals (PRESERVISION AREDS 2 PO), Take 1 tablet by mouth 2 (two) times daily., Disp: , Rfl:   .  pantoprazole (PROTONIX) 20 MG tablet, TAKE 2 TABLETS(40 MG) BY MOUTH DAILY, Disp: 180 tablet, Rfl: 3 .  rosuvastatin (CRESTOR) 20 MG tablet, Take 1 tablet (20 mg total) by mouth daily., Disp: 90 tablet, Rfl: 2 .  zolpidem (AMBIEN) 10 MG tablet, Take 10 mg by mouth at bedtime. , Disp: , Rfl:   Past Medical History: Past Medical History:  Diagnosis Date  . Anxiety    takes Xanax daily as needed  . Arthritis   . Cataract    removed both eyes  . Depression    takes Celexa daily  . Diverticulitis   . Diverticulosis   . Enlarged prostate    sightly  . Esophagitis   . Gastric ulcer   . GERD (gastroesophageal reflux disease)    takes Protonix daily  . Hx of adenomatous colonic polyps    benign  . Hyperlipidemia    takes Zocor daily  . Hypertension   . Insomnia    takes Ambien nightly  . Joint pain   . Joint swelling     Tobacco Use: Social History   Tobacco Use  Smoking Status Never Smoker  Smokeless Tobacco Never Used    Labs: Recent Review Flowsheet Data    Labs for ITP Cardiac and Pulmonary Rehab Latest Ref Rng & Units 11/16/2017 11/16/2017 11/17/2017 01/02/2018 04/03/2018   Cholestrol 100 -  199 mg/dL - - - 107 180   LDLCALC 0 - 99 mg/dL - - - 39 85   HDL >39 mg/dL - - - 44 36(L)   Trlycerides 0 - 149 mg/dL - - - 120 293(H)   Hemoglobin A1c 4.8 - 5.6 % - - - - -   PHART 7.350 - 7.450 7.334(L) - - - -   PCO2ART 32.0 - 48.0 mmHg 38.7 - - - -   HCO3 20.0 - 28.0 mmol/L 20.8 - - - -   TCO2 22 - 32 mmol/L '22 23 23 '$ - -   ACIDBASEDEF 0.0 - 2.0 mmol/L 5.0(H) - - - -   O2SAT % 98.0 - - - -      Capillary Blood Glucose: Lab Results  Component Value Date   GLUCAP 110 (H) 11/22/2017   GLUCAP 103 (H) 11/20/2017   GLUCAP 105 (H) 11/20/2017   GLUCAP 101 (H) 11/20/2017   GLUCAP 97 11/20/2017     Exercise Target Goals: Exercise Program Goal: Individual exercise prescription set using results from initial 6 min walk test and THRR while considering  patient's  activity barriers and safety.   Exercise Prescription Goal: Starting with aerobic activity 30 plus minutes a day, 3 days per week for initial exercise prescription. Provide home exercise prescription and guidelines that participant acknowledges understanding prior to discharge.  Activity Barriers & Risk Stratification:   6 Minute Walk:   Oxygen Initial Assessment:   Oxygen Re-Evaluation:   Oxygen Discharge (Final Oxygen Re-Evaluation):   Initial Exercise Prescription:   Perform Capillary Blood Glucose checks as needed.  Exercise Prescription Changes: Exercise Prescription Changes    Row Name 03/05/18 [phone] 03/21/18 0952 04/02/18 0954 04/16/18 0956 04/20/18 0952     Response to Exercise   Blood Pressure (Admit)  148/78  130/76  122/70  120/70  124/66   Blood Pressure (Exercise)  118/68  140/68  140/64  156/82  124/70   Blood Pressure (Exit)  130/74  102/70  120/72  120/80  120/80   Heart Rate (Admit)  65 bpm  71 bpm  75 bpm  61 bpm  80 bpm   Heart Rate (Exercise)  77 bpm  108 bpm  111 bpm  107 bpm  127 bpm   Heart Rate (Exit)  62 bpm  71 bpm  85 bpm  69 bpm  80 bpm   Rating of Perceived Exertion (Exercise)  '13  13  13  13  13   '$ Symptoms  none  none  none  none  none   Duration  Progress to 30 minutes of  aerobic without signs/symptoms of physical distress  Progress to 30 minutes of  aerobic without signs/symptoms of physical distress  Progress to 30 minutes of  aerobic without signs/symptoms of physical distress  Progress to 30 minutes of  aerobic without signs/symptoms of physical distress  Progress to 30 minutes of  aerobic without signs/symptoms of physical distress   Intensity  THRR unchanged  THRR unchanged  THRR unchanged  THRR unchanged  THRR unchanged     Progression   Progression  Continue to progress workloads to maintain intensity without signs/symptoms of physical distress.  Continue to progress workloads to maintain intensity without signs/symptoms of physical  distress.  Continue to progress workloads to maintain intensity without signs/symptoms of physical distress.  Continue to progress workloads to maintain intensity without signs/symptoms of physical distress.  Continue to progress workloads to maintain intensity without signs/symptoms of physical distress.  Average METs  3.1  3.4  3.9  4.2  4.5     Resistance Training   Training Prescription  Yes  No Relaxation day, no weights.  No No weights today, out early.  Yes  Yes   Weight  4lbs  -  -  4lbs lt hand, 5lbs rt hand  4lbs lt hand, 5lbs rt hand   Reps  10-15  -  -  10-15  10-15   Time  10 Minutes  -  -  10 Minutes  10 Minutes     Interval Training   Interval Training  No  No  No  No  No     Bike   Level  3 Switched from Airdyne to upright SciFit bike  4 Scifit bike  5 Scifit bike  5 Scifit bike  5 Scifit bike   Watts  -  -  72  82  100   Minutes  '10  10  10  10  10   '$ METs  3.4  -  4.69  4.97  5.6     NuStep   Level  '3  4  5  5  5   '$ SPM  85  85  85  85  85   Minutes  '10  10  10  10  10   '$ METs  2.2  3.1  3.3  3.4  3.7     Track   Laps  '16  16  16  19  18   '$ Minutes  '10  10  10  10  10   '$ METs  3.78  3.78  3.78  4.3  4.13     Home Exercise Plan   Plans to continue exercise at  Home (comment) Walking 30 minutes, 2-3 days/week at home.  Home (comment) Walking 30 minutes, 2-3 days/week at home.  Home (comment) Walking 30 minutes, 2-3 days/week at home.  Home (comment) Walking 30 minutes, 2-3 days/week at home.  Home (comment) Walking 30 minutes, 2-3 days/week at home.   Frequency  Add 3 additional days to program exercise sessions.  Add 3 additional days to program exercise sessions.  Add 3 additional days to program exercise sessions.  Add 3 additional days to program exercise sessions.  Add 3 additional days to program exercise sessions.   Initial Home Exercises Provided  02/28/18  02/28/18  02/28/18  02/28/18  02/28/18   Row Name 08/08/18 1100 08/27/18 1000           Response to  Exercise   Blood Pressure (Admit)  122/70  124/64      Blood Pressure (Exercise)  122/82  142/72      Blood Pressure (Exit)  124/62  124/80      Heart Rate (Admit)  79 bpm  68 bpm      Heart Rate (Exercise)  88 bpm  96 bpm      Heart Rate (Exit)  70 bpm  64 bpm      Rating of Perceived Exertion (Exercise)  12  12      Perceived Dyspnea (Exercise)  0  0      Symptoms  None  None      Comments  Pt's first day since department closure  -      Duration  Continue with 30 min of aerobic exercise without signs/symptoms of physical distress.  Continue with 30 min of aerobic exercise without signs/symptoms of physical distress.      Intensity  THRR unchanged  THRR unchanged        Progression   Progression  Continue to progress workloads to maintain intensity without signs/symptoms of physical distress.  Continue to progress workloads to maintain intensity without signs/symptoms of physical distress.      Average METs  2.6  3        Resistance Training   Training Prescription  Yes  Yes      Weight  3lbs  4lbs      Reps  10-15  10-15      Time  10 Minutes  10 Minutes        Interval Training   Interval Training  No  No        Recumbant Bike   Level  3  4      Minutes  15  15      METs  2.5  3.1        NuStep   Level  5  4      SPM  85  85      Minutes  15  15      METs  2.7  2.8        Home Exercise Plan   Plans to continue exercise at  Home (comment) Walking  Home (comment) Walking      Frequency  Add 3 additional days to program exercise sessions.  Add 3 additional days to program exercise sessions.      Initial Home Exercises Provided  02/28/18  02/28/18         Exercise Comments: Exercise Comments    Row Name 03/05/18 1009 03/21/18 1015 03/26/18 1000 04/02/18 1025 04/16/18 1040   Exercise Comments  Reviewed METs with patient.  Reviewed METs and goals with patient.  Paitent will begin exercise at [employer] as his mode of exercise on the days that he doesn't attend cardiac  rehab.  METs reviewed with patient.  Reviewed METs and goals with patient.   [place] Name 08/08/18 1124 08/27/18 1101         Exercise Comments  Pt's first day of exercise since department closure due to COVID 19. Pt responded to exercise well and reported no symptoms.  Reviewed goals with patient. Pt progressing well with exercise.         Exercise Goals and Review:   Exercise Goals Re-Evaluation : Exercise Goals Re-Evaluation    Row Name 03/21/18 1015 04/16/18 1040 04/25/18 1107 08/27/18 1055       Exercise Goal Re-Evaluation   Exercise Goals Review  Increase Physical Activity;Able to understand and use rate of perceived exertion (RPE) scale;Knowledge and understanding of Target Heart Rate Range (THRR);Understanding of Exercise Prescription;Able to check pulse independently;Increase Strength and Stamina  Increase Physical Activity;Able to understand and use rate of perceived exertion (RPE) scale;Knowledge and understanding of Target Heart Rate Range (THRR);Understanding of Exercise Prescription;Able to check pulse independently;Increase Strength and Stamina  -  Increase Physical Activity;Able to understand and use rate of perceived exertion (RPE) scale;Knowledge and understanding of Target Heart Rate Range (THRR);Understanding of Exercise Prescription;Able to check pulse independently;Increase Strength and Stamina    Comments  Patient states he's doing some walking at home, approximately 30 minutes. Encouraged patient to increased hand weight size at cardiac rehab to help increase strength.  Patient is making great progress with exercise. Pt is exercising at least 3 days/week in addition to exercise at cardiac rehab. Pt has started resistance training with a personal trainer on Tuesdays and  Thursday and biking at gym, 60 minutes, 2 days/week. Pt's current MET level is 4.1  Temporary department closure due to COVID-19.  Patient tolerating exercise well without symptoms. Pt is walking 35-40 minutes,  6-7 days/week. Pt also, has hand weights at home that he uses. Reviewed THRR with pt, and pt has a HR monitor to check pulse.    Expected Outcomes  Patient will walk at least 30 minutes 2-3 days/week at home in addition to exercise at cardiac rehab, as well as increasing hand weights to help increase strength and stamina.  Patient will continue exercise 6-7 days/week to achieve personal health and fitness goals.  -  Patient will continue exercise 6-7 days/week to achieve personal health and fitness goals.        Discharge Exercise Prescription (Final Exercise Prescription Changes): Exercise Prescription Changes - 08/27/18 1000      Response to Exercise   Blood Pressure (Admit)  124/64    Blood Pressure (Exercise)  142/72    Blood Pressure (Exit)  124/80    Heart Rate (Admit)  68 bpm    Heart Rate (Exercise)  96 bpm    Heart Rate (Exit)  64 bpm    Rating of Perceived Exertion (Exercise)  12    Perceived Dyspnea (Exercise)  0    Symptoms  None    Duration  Continue with 30 min of aerobic exercise without signs/symptoms of physical distress.    Intensity  THRR unchanged      Progression   Progression  Continue to progress workloads to maintain intensity without signs/symptoms of physical distress.    Average METs  3      Resistance Training   Training Prescription  Yes    Weight  4lbs    Reps  10-15    Time  10 Minutes      Interval Training   Interval Training  No      Recumbant Bike   Level  4    Minutes  15    METs  3.1      NuStep   Level  4    SPM  85    Minutes  15    METs  2.8      Home Exercise Plan   Plans to continue exercise at  Home (comment)   Walking   Frequency  Add 3 additional days to program exercise sessions.    Initial Home Exercises Provided  02/28/18       Nutrition:  Target Goals: Understanding of nutrition guidelines, daily intake of sodium '1500mg'$ , cholesterol '200mg'$ , calories 30% from fat and 7% or less from saturated fats, daily to have 5  or more servings of fruits and vegetables.  Biometrics:    Nutrition Therapy Plan and Nutrition Goals:   Nutrition Assessments:   Nutrition Goals Re-Evaluation:   Nutrition Goals Discharge (Final Nutrition Goals Re-Evaluation):   Psychosocial: Target Goals: Acknowledge presence or absence of significant depression and/or stress, maximize coping skills, provide positive support system. Participant is able to verbalize types and ability to use techniques and skills needed for reducing stress and depression.  Initial Review & Psychosocial Screening:   Quality of Life Scores:  Scores of 19 and below usually indicate a poorer quality of life in these areas.  A difference of  2-3 points is a clinically meaningful difference.  A difference of 2-3 points in the total score of the Quality of Life Index has been associated with significant improvement in overall quality of  life, self-image, physical symptoms, and general health in studies assessing change in quality of life.  PHQ-9: Recent Review Flowsheet Data    Depression screen [facility] 2/9 08/08/2018 02/19/2018 12/14/2017   Decreased Interest 0 0 0   Down, Depressed, Hopeless 0 0 0   PHQ - 2 Score 0 0 0     Interpretation of Total Score  Total Score Depression Severity:  1-4 = Minimal depression, 5-9 = Mild depression, 10-14 = Moderate depression, 15-19 = Moderately severe depression, 20-27 = Severe depression   Psychosocial Evaluation and Intervention:   Psychosocial Re-Evaluation: Psychosocial Re-Evaluation    [place] Name 03/15/18 0841 04/06/18 1727 08/08/18 1126 08/30/18 1418       Psychosocial Re-Evaluation   Current issues with  None Identified  None Identified  None Identified  None Identified    Comments  -  -  Leiby resumed exercise today. No issues noted  -    Interventions  Encouraged to attend Cardiac Rehabilitation for the exercise  Encouraged to attend Cardiac Rehabilitation for the exercise  Encouraged to attend Cardiac  Rehabilitation for the exercise  Encouraged to attend Cardiac Rehabilitation for the exercise    Continue Psychosocial Services   No Follow up required  No Follow up required  No Follow up required  No Follow up required       Psychosocial Discharge (Final Psychosocial Re-Evaluation): Psychosocial Re-Evaluation - 08/30/18 1418      Psychosocial Re-Evaluation   Current issues with  None Identified    Interventions  Encouraged to attend Cardiac Rehabilitation for the exercise    Continue Psychosocial Services   No Follow up required       Vocational Rehabilitation: Provide vocational rehab assistance to qualifying candidates.   Vocational Rehab Evaluation & Intervention:   Education: Education Goals: Education classes will be provided on a weekly basis, covering required topics. Participant will state understanding/return demonstration of topics presented.  Learning Barriers/Preferences:   Education Topics: Hypertension, Hypertension Reduction -Define heart disease and high blood pressure. Discus how high blood pressure affects the body and ways to reduce high blood pressure.   Exercise and Your Heart -Discuss why it is important to exercise, the FITT principles of exercise, normal and abnormal responses to exercise, and how to exercise safely.   Angina -Discuss definition of angina, causes of angina, treatment of angina, and how to decrease risk of having angina.   Cardiac Medications -Review what the following cardiac medications are used for, how they affect the body, and side effects that may occur when taking the medications.  Medications include Aspirin, Beta blockers, calcium channel blockers, ACE Inhibitors, angiotensin receptor blockers, diuretics, digoxin, and antihyperlipidemics.   Congestive Heart Failure -Discuss the definition of CHF, how to live with CHF, the signs and symptoms of CHF, and how keep track of weight and sodium intake.   Heart Disease and  Intimacy -Discus the effect sexual activity has on the heart, how changes occur during intimacy as we age, and safety during sexual activity.   Smoking Cessation / COPD -Discuss different methods to quit smoking, the health benefits of quitting smoking, and the definition of COPD.   Nutrition I: Fats -Discuss the types of cholesterol, what cholesterol does to the heart, and how cholesterol levels can be controlled.   Nutrition II: Labels -Discuss the different components of food labels and how to read food label   Heart Parts/Heart Disease and PAD -Discuss the anatomy of the heart, the pathway of blood circulation through the  heart, and these are affected by heart disease.   Stress I: Signs and Symptoms -Discuss the causes of stress, how stress may lead to anxiety and depression, and ways to limit stress.   Stress II: Relaxation -Discuss different types of relaxation techniques to limit stress.   Warning Signs of Stroke / TIA -Discuss definition of a stroke, what the signs and symptoms are of a stroke, and how to identify when someone is having stroke.   Knowledge Questionnaire Score:   Core Components/Risk Factors/Patient Goals at Admission:   Core Components/Risk Factors/Patient Goals Review:  Goals and Risk Factor Review    Row Name 03/15/18 1410 04/06/18 1727 08/17/18 1345 08/30/18 1419       Core Components/Risk Factors/Patient Goals Review   Personal Goals Review  Weight Management/Obesity;Hypertension;Stress;Lipids  Weight Management/Obesity;Hypertension;Stress;Lipids  Weight Management/Obesity;Hypertension;Stress;Lipids  Weight Management/Obesity;Hypertension;Stress;Lipids    Review  Bach is doing well with exercise. Sebastin's vital signs and weights have been stable  Clancey is doing well with exercise. Blayne's vital signs and weights have been stable  [name] returned to exercise last week and has been doing well with exercise. Kayhan has gained a few pounds since the [place]  19 pandemic.  Charles continues to do well with exercise. Will given patent nutritional information on weight loss tips and heart healthy snack handouts    Expected Outcomes  Pasha will continue to partcipate in phase 2 cardiac rehab for exercise nutrition and lifestyle modifications  Traylon will continue to partcipate in phase 2 cardiac rehab for exercise nutrition and lifestyle modifications  Barnabas will continue to partcipate in phase 2 cardiac rehab for exercise nutrition and lifestyle modifications.  Bell will continue to partcipate in phase 2 cardiac rehab for exercise nutrition and lifestyle modifications.       Core Components/Risk Factors/Patient Goals at Discharge (Final Review):  Goals and Risk Factor Review - 08/30/18 1419      Core Components/Risk Factors/Patient Goals Review   Personal Goals Review  Weight Management/Obesity;Hypertension;Stress;Lipids    Review  Crisanto continues to do well with exercise. Will given patent nutritional information on weight loss tips and heart healthy snack handouts    Expected Outcomes  Kristian will continue to partcipate in phase 2 cardiac rehab for exercise nutrition and lifestyle modifications.       ITP Comments: ITP Comments    Row Name 03/15/18 0846 04/06/18 1726 08/30/18 1416       ITP Comments  30 Day ITP Review. Patient is with good participation and attendance in phase 2 cardiac rehab  30 Day ITP Review. Patient is with good participation and attendance in phase 2 cardiac rehab  30 Day ITP Review. Shea is with good attendance and participation in phase 2 cardiac rehab since exercise resumed        Comments: See ITP comments.Barnet Pall, RN,BSN 08/30/2018 2:30 PM

## 2018-08-31 ENCOUNTER — Encounter (HOSPITAL_COMMUNITY)
Admission: RE | Admit: 2018-08-31 | Discharge: 2018-08-31 | Disposition: A | Discharge status: Home / Self Care | Payer: PPO | Source: Ambulatory Visit | Attending: Cardiovascular Disease | Admitting: Cardiovascular Disease

## 2018-08-31 ENCOUNTER — Other Ambulatory Visit: Payer: Self-pay

## 2018-08-31 VITALS — Ht 68.0 in | Wt 201.5 lb

## 2018-08-31 DIAGNOSIS — Z951 Presence of aortocoronary bypass graft: Secondary | ICD-10-CM | POA: Diagnosis not present

## 2018-08-31 NOTE — Progress Notes (Signed)
Patient given information on heart healthy snacks, weight loss tips and nutritional skill making a lifestyle change via handouts.Barnet Pall, RN,BSN 08/31/2018 10:37 AM

## 2018-09-03 ENCOUNTER — Encounter (HOSPITAL_COMMUNITY): Payer: PPO

## 2018-09-05 ENCOUNTER — Encounter (HOSPITAL_COMMUNITY)
Admission: RE | Admit: 2018-09-05 | Discharge: 2018-09-05 | Disposition: A | Discharge status: Home / Self Care | Payer: PPO | Source: Ambulatory Visit | Attending: Cardiovascular Disease | Admitting: Cardiovascular Disease

## 2018-09-05 ENCOUNTER — Other Ambulatory Visit: Payer: Self-pay

## 2018-09-05 DIAGNOSIS — Z951 Presence of aortocoronary bypass graft: Secondary | ICD-10-CM

## 2018-09-05 NOTE — Progress Notes (Signed)
Discharge Progress Report  Patient Details  Name: Carl [name] MRN: [id number] Date of Birth: Jun 13, 1945 Referring Provider:     CARDIAC REHAB PHASE II ORIENTATION from 02/15/2018 in [place]  Referring Provider  Troy Sine MD        Number of Visits: 35  Reason for Discharge:  Patient reached a stable level of exercise. Patient independent in their exercise. Patient has met program and personal goals.  Smoking History:  Social History   Tobacco Use  Smoking Status Never Smoker  Smokeless Tobacco Never Used    Diagnosis: S/P CABG x 4   ADL UCSD:   Initial Exercise Prescription:   Discharge Exercise Prescription (Final Exercise Prescription Changes): Exercise Prescription Changes - 09/07/18 1200      Response to Exercise   Blood Pressure (Admit)  122/60    Blood Pressure (Exercise)  150/72    Blood Pressure (Exit)  112/64    Heart Rate (Admit)  74 bpm    Heart Rate (Exercise)  104 bpm    Heart Rate (Exit)  68 bpm    Rating of Perceived Exertion (Exercise)  12    Perceived Dyspnea (Exercise)  0    Symptoms  None    Comments  Pt last day of CR program.     Duration  Continue with 30 min of aerobic exercise without signs/symptoms of physical distress.    Intensity  THRR unchanged      Progression   Progression  Continue to progress workloads to maintain intensity without signs/symptoms of physical distress.    Average METs  4.3      Resistance Training   Training Prescription  Yes    Weight  4lbs    Reps  10-15    Time  10 Minutes      Interval Training   Interval Training  No      Recumbant Bike   Level  4    Minutes  15    METs  6      NuStep   Level  4    SPM  85    Minutes  15    METs  2.6      Home Exercise Plan   Plans to continue exercise at  Home (comment)   Walking   Frequency  Add 3 additional days to program exercise sessions.    Initial Home Exercises Provided  02/28/18       Functional  Capacity: 6 Minute Walk    Row Name 08/31/18 1204         6 Minute Walk   Phase  Discharge     Distance  1914 feet     Distance % Change  16.14 %     Distance Feet Change  266 ft     Walk Time  6 minutes     # of Rest Breaks  0     MPH  3.6     METS  3.8     RPE  13     Perceived Dyspnea   0     VO2 Peak  13.55     Symptoms  No     Resting HR  62 bpm     Resting BP  122/76     Max Ex. HR  110 bpm     Max Ex. BP  148/74     2 Minute Post BP  114/62        Psychological,  QOL, Others - Outcomes: PHQ 2/9: Depression screen Carl [name] 2/9 09/05/2018 08/08/2018 02/19/2018 12/14/2017  Decreased Interest 0 0 0 0  Down, Depressed, Hopeless 0 0 0 0  PHQ - 2 Score 0 0 0 0    Quality of Life: Quality of Life - 09/07/18 1251      Quality of Life   Select  Quality of Life   Pt did not complete QOL for post scores.      Personal Goals: Goals established at orientation with interventions provided to work toward goal.    Personal Goals Discharge: Goals and Risk Factor Review    Row Name 04/06/18 1727 08/17/18 1345 08/30/18 1419 09/07/18 1314       Core Components/Risk Factors/Patient Goals Review   Personal Goals Review  Weight Management/Obesity;Hypertension;Stress;Lipids  Weight Management/Obesity;Hypertension;Stress;Lipids  Weight Management/Obesity;Hypertension;Stress;Lipids  -    Review  Carl [name] is doing well with exercise. Carl [name]'s vital signs and weights have been stable  Carl [name] returned to exercise last week and has been doing well with exercise. Carl [name] has gained a few pounds since the Carl [name] 19 pandemic.  Carl [name] continues to do well with exercise. Carl [name] Carl [name] patent nutritional information on weight loss tips and heart healthy snack handouts  Carl [name] completed 35 sessions of CR.  He did very well and enjoyed participating in the program.  He looks forward to eating healthier.    Expected Outcomes  Carl [name] Carl [name] continue to partcipate in phase 2 cardiac rehab for exercise nutrition and lifestyle  modifications  Carl [name] Carl [name] continue to partcipate in phase 2 cardiac rehab for exercise nutrition and lifestyle modifications.  Carl [name] Carl [name] continue to partcipate in phase 2 cardiac rehab for exercise nutrition and lifestyle modifications.  Carl [name] Carl [name] continue to partcipate in exercise, nutrition, and lifestyle modifications.  Carl [name] plans to walk and participate in a pilates class.       Exercise Goals and Review:   Exercise Goals Re-Evaluation: Exercise Goals Re-Evaluation    Row Name 03/21/18 1015 04/16/18 1040 04/25/18 1107 08/27/18 1055 09/07/18 1241     Exercise Goal Re-Evaluation   Exercise Goals Review  Increase Physical Activity;Able to understand and use rate of perceived exertion (RPE) scale;Knowledge and understanding of Target Heart Rate Range (THRR);Understanding of Exercise Prescription;Able to check pulse independently;Increase Strength and Stamina  Increase Physical Activity;Able to understand and use rate of perceived exertion (RPE) scale;Knowledge and understanding of Target Heart Rate Range (THRR);Understanding of Exercise Prescription;Able to check pulse independently;Increase Strength and Stamina  -  Increase Physical Activity;Able to understand and use rate of perceived exertion (RPE) scale;Knowledge and understanding of Target Heart Rate Range (THRR);Understanding of Exercise Prescription;Able to check pulse independently;Increase Strength and Stamina  Increase Physical Activity;Increase Strength and Stamina;Able to understand and use rate of perceived exertion (RPE) scale;Knowledge and understanding of Target Heart Rate Range (THRR);Able to check pulse independently;Understanding of Exercise Prescription   Comments  Patient states he's doing some walking at home, approximately 30 minutes. Encouraged patient to increased hand weight size at cardiac rehab to help increase strength.  Patient is making great progress with exercise. Pt is exercising at least 3 days/week in addition to  exercise at cardiac rehab. Pt has started resistance training with a personal trainer on Tuesdays and Thursday and biking at gym, 60 minutes, 2 days/week. Pt's current MET level is 4.1  Temporary department closure due to COVID-19.  Patient tolerating exercise well without symptoms. Pt is walking 35-40 minutes, 6-7 days/week. Pt also, has hand weights at home that he uses.  Reviewed THRR with pt, and pt has a HR monitor to check pulse.  Pt graduated CFR program today. Pt tolerated exercise prescription well and progressed well through the program. Pt MET level increased gradually through the program. Pt stated he Carl [name] be walking and taking pilates after graduating CR.   Expected Outcomes  Patient Carl [name] walk at least 30 minutes 2-3 days/week at home in addition to exercise at cardiac rehab, as well as increasing hand weights to help increase strength and stamina.  Patient Carl [name] continue exercise 6-7 days/week to achieve personal health and fitness goals.  -  Patient Carl [name] continue exercise 6-7 days/week to achieve personal health and fitness goals.  Pt Carl [name] continue exercising at home.      Nutrition & Weight - Outcomes:  Post Biometrics - 08/31/18 1205       Post  Biometrics   Height  5' 8"  (1.727 m)    Weight  201 lb 8 oz (91.4 kg)    Waist Circumference  41 inches    Hip Circumference  42.5 inches    Waist to Hip Ratio  0.96 %    BMI (Calculated)  30.65    Triceps Skinfold  11.5 mm    % Body Fat  27.4 %    Grip Strength  41 kg    Flexibility  11.75 in    Single Leg Stand  12.06 seconds       Nutrition:   Nutrition Discharge:   Education Questionnaire Score: Knowledge Questionnaire Score - 09/07/18 0911      Knowledge Questionnaire Score   Pre Score  19/24    Post Score  23/24       Goals reviewed with patient; copy Carl [name] to patient.Pt graduated from cardiac rehab program 09/07/18 with completion of 35 exercise sessions in Phase II. Pt maintained good attendance and progressed  nicely during his participation in rehab as evidenced by increased MET level.   Medication list reconciled. Repeat  PHQ score- 0 .  Pt has made significant lifestyle changes and should be commended for his success. Pt feels he has achieved his goals during cardiac rehab.   Pt plans to continue exercise by walking and participating in low impact piliates.Barnet Pall, RN,BSN 09/14/2018 8:25 AM

## 2018-09-07 ENCOUNTER — Encounter (HOSPITAL_COMMUNITY)
Admission: RE | Admit: 2018-09-07 | Discharge: 2018-09-07 | Disposition: A | Discharge status: Home / Self Care | Payer: PPO | Source: Ambulatory Visit | Attending: Cardiovascular Disease | Admitting: Cardiovascular Disease

## 2018-09-07 ENCOUNTER — Other Ambulatory Visit: Payer: Self-pay

## 2018-09-07 ENCOUNTER — Encounter (HOSPITAL_COMMUNITY): Payer: Self-pay

## 2018-09-07 DIAGNOSIS — I10 Essential (primary) hypertension: Secondary | ICD-10-CM | POA: Diagnosis not present

## 2018-09-07 DIAGNOSIS — I480 Paroxysmal atrial fibrillation: Secondary | ICD-10-CM | POA: Diagnosis not present

## 2018-09-07 DIAGNOSIS — E782 Mixed hyperlipidemia: Secondary | ICD-10-CM | POA: Diagnosis not present

## 2018-09-07 DIAGNOSIS — G459 Transient cerebral ischemic attack, unspecified: Secondary | ICD-10-CM | POA: Diagnosis not present

## 2018-09-07 DIAGNOSIS — Z951 Presence of aortocoronary bypass graft: Secondary | ICD-10-CM

## 2018-09-07 DIAGNOSIS — I2510 Atherosclerotic heart disease of native coronary artery without angina pectoris: Secondary | ICD-10-CM | POA: Diagnosis not present

## 2018-09-07 DIAGNOSIS — H409 Unspecified glaucoma: Secondary | ICD-10-CM | POA: Diagnosis not present

## 2018-09-07 DIAGNOSIS — E032 Hypothyroidism due to medicaments and other exogenous substances: Secondary | ICD-10-CM | POA: Diagnosis not present

## 2018-09-11 ENCOUNTER — Telehealth: Payer: Self-pay | Admitting: Cardiovascular Disease

## 2018-09-11 DIAGNOSIS — E039 Hypothyroidism, unspecified: Secondary | ICD-10-CM

## 2018-09-11 NOTE — Telephone Encounter (Signed)
Noted. Will be on the lookout for paperwork being sent over.

## 2018-09-11 NOTE — Telephone Encounter (Signed)
New message    Patient is asking [name] to look at a report that Dr. Rex Kras has sent over to [name]. Please advise.

## 2018-09-12 NOTE — Telephone Encounter (Signed)
After not receiving any lab work yesterday, contacted [place] office to have them send it over again. Rep sending over information now will give to [facility] to review once I get them.

## 2018-09-14 DIAGNOSIS — K439 Ventral hernia without obstruction or gangrene: Secondary | ICD-10-CM | POA: Diagnosis not present

## 2018-09-14 DIAGNOSIS — F419 Anxiety disorder, unspecified: Secondary | ICD-10-CM | POA: Diagnosis not present

## 2018-09-17 MED ORDER — LEVOTHYROXINE SODIUM 125 MCG PO TABS: 125.0000 ug | ORAL_TABLET | Freq: Every day | ORAL | 1 refills | Status: DC

## 2018-09-17 NOTE — Telephone Encounter (Signed)
Received call from [facility] who verbally agreed to increase Levothyroxine to 125 mcg due to lab work, he would like patient to have repeat blood work TSH in 1 month, and to see him as well.  Please add patient to Dr.Kelly's schedule on September 15th at 2:40. Patient is aware of appointment already. Thanks!

## 2018-10-04 ENCOUNTER — Other Ambulatory Visit: Payer: Self-pay

## 2018-10-04 DIAGNOSIS — I4891 Unspecified atrial fibrillation: Secondary | ICD-10-CM

## 2018-10-04 DIAGNOSIS — E785 Hyperlipidemia, unspecified: Secondary | ICD-10-CM

## 2018-10-04 DIAGNOSIS — Z79899 Other long term (current) drug therapy: Secondary | ICD-10-CM

## 2018-10-05 DIAGNOSIS — E785 Hyperlipidemia, unspecified: Secondary | ICD-10-CM | POA: Diagnosis not present

## 2018-10-05 DIAGNOSIS — I4891 Unspecified atrial fibrillation: Secondary | ICD-10-CM | POA: Diagnosis not present

## 2018-10-05 DIAGNOSIS — Z79899 Other long term (current) drug therapy: Secondary | ICD-10-CM | POA: Diagnosis not present

## 2018-10-05 LAB — CBC WITH DIFFERENTIAL/PLATELET
Basophils Absolute: 0.1 10*3/uL (ref 0.0–0.2)
Basos: 1 %
EOS (ABSOLUTE): 0.3 10*3/uL (ref 0.0–0.4)
Eos: 5 %
Hematocrit: 44.2 % (ref 37.5–51.0)
Hemoglobin: 15.4 g/dL (ref 13.0–17.7)
Immature Grans (Abs): 0 10*3/uL (ref 0.0–0.1)
Immature Granulocytes: 0 %
Lymphocytes Absolute: 1.2 10*3/uL (ref 0.7–3.1)
Lymphs: 19 %
MCH: 33.5 pg — ABNORMAL HIGH (ref 26.6–33.0)
MCHC: 34.8 g/dL (ref 31.5–35.7)
MCV: 96 fL (ref 79–97)
Monocytes Absolute: 1.2 10*3/uL — ABNORMAL HIGH (ref 0.1–0.9)
Monocytes: 18 %
Neutrophils Absolute: 3.8 10*3/uL (ref 1.4–7.0)
Neutrophils: 57 %
Platelets: 238 10*3/uL (ref 150–450)
RBC: 4.6 x10E6/uL (ref 4.14–5.80)
RDW: 12.1 % (ref 11.6–15.4)
WBC: 6.6 10*3/uL (ref 3.4–10.8)

## 2018-10-06 LAB — COMPREHENSIVE METABOLIC PANEL
ALT: 49 IU/L — ABNORMAL HIGH (ref 0–44)
AST: 46 IU/L — ABNORMAL HIGH (ref 0–40)
Albumin/Globulin Ratio: 2.2 (ref 1.2–2.2)
Albumin: 4.6 g/dL (ref 3.7–4.7)
Alkaline Phosphatase: 91 IU/L (ref 39–117)
BUN/Creatinine Ratio: 16 (ref 10–24)
BUN: 15 mg/dL (ref 8–27)
Bilirubin Total: 0.4 mg/dL (ref 0.0–1.2)
CO2: 22 mmol/L (ref 20–29)
Calcium: 9.5 mg/dL (ref 8.6–10.2)
Chloride: 100 mmol/L (ref 96–106)
Creatinine, Ser: 0.96 mg/dL (ref 0.76–1.27)
GFR calc Af Amer: 90 mL/min/{1.73_m2} (ref >59)
GFR calc non Af Amer: 78 mL/min/{1.73_m2} (ref >59)
Globulin, Total: 2.1 g/dL (ref 1.5–4.5)
Glucose: 107 mg/dL — ABNORMAL HIGH (ref 65–99)
Potassium: 4.6 mmol/L (ref 3.5–5.2)
Sodium: 139 mmol/L (ref 134–144)
Total Protein: 6.7 g/dL (ref 6.0–8.5)

## 2018-10-06 LAB — LIPID PANEL
Chol/HDL Ratio: 3 ratio (ref 0.0–5.0)
Cholesterol, Total: 143 mg/dL (ref 100–199)
HDL: 48 mg/dL (ref >39)
LDL Calculated: 67 mg/dL (ref 0–99)
Triglycerides: 142 mg/dL (ref 0–149)
VLDL Cholesterol Cal: 28 mg/dL (ref 5–40)

## 2018-10-06 LAB — BRAIN NATRIURETIC PEPTIDE: BNP: 131.4 pg/mL — ABNORMAL HIGH (ref 0.0–100.0)

## 2018-10-06 LAB — TSH: TSH: 15.9 u[IU]/mL — ABNORMAL HIGH (ref 0.450–4.500)

## 2018-10-08 ENCOUNTER — Other Ambulatory Visit: Payer: Self-pay

## 2018-10-08 ENCOUNTER — Encounter: Payer: Self-pay | Admitting: Cardiovascular Disease

## 2018-10-08 ENCOUNTER — Ambulatory Visit (INDEPENDENT_AMBULATORY_CARE_PROVIDER_SITE_OTHER): Payer: PPO | Admitting: Cardiovascular Disease

## 2018-10-08 DIAGNOSIS — I480 Paroxysmal atrial fibrillation: Secondary | ICD-10-CM

## 2018-10-08 DIAGNOSIS — E039 Hypothyroidism, unspecified: Secondary | ICD-10-CM | POA: Diagnosis not present

## 2018-10-08 DIAGNOSIS — I2510 Atherosclerotic heart disease of native coronary artery without angina pectoris: Secondary | ICD-10-CM

## 2018-10-08 DIAGNOSIS — Z7901 Long term (current) use of anticoagulants: Secondary | ICD-10-CM | POA: Diagnosis not present

## 2018-10-08 DIAGNOSIS — Z951 Presence of aortocoronary bypass graft: Secondary | ICD-10-CM | POA: Diagnosis not present

## 2018-10-08 DIAGNOSIS — E785 Hyperlipidemia, unspecified: Secondary | ICD-10-CM | POA: Diagnosis not present

## 2018-10-08 DIAGNOSIS — I10 Essential (primary) hypertension: Secondary | ICD-10-CM | POA: Diagnosis not present

## 2018-10-08 MED ORDER — LEVOTHYROXINE SODIUM 150 MCG PO TABS: 150.0000 ug | ORAL_TABLET | Freq: Every day | ORAL | 1 refills | Status: DC

## 2018-10-08 NOTE — Patient Instructions (Addendum)
Medication Instructions:  Increase Levothyroxine 150 mcg daily. If you need a refill on your cardiac medications before your next appointment, please call your pharmacy.   Lab work: TSH in 4-6 weeks. If you have labs (blood work) drawn today and your tests are completely normal, you will receive your results only by: Marland Kitchen MyChart Message (if you have MyChart) OR . A paper copy in the mail If you have any lab test that is abnormal or we need to change your treatment, we will call you to review the results.  Follow-Up: At [facility], you and your health needs are our priority.  As part of our continuing mission to provide you with exceptional heart care, we have created designated Provider Care Teams.  These Care Teams include your primary Cardiologist (physician) and Advanced Practice Providers (APPs -  Physician Assistants and Nurse Practitioners) who all work together to provide you with the care you need, when you need it. You will need a follow up appointment in 4 months.  You may see Shelva Majestic, MD or one of the following Advanced Practice Providers on your designated Care Team: [place], [place] . Fabian Sharp, PA-C

## 2018-10-08 NOTE — Progress Notes (Signed)
Cardiology Office Note    Date:  10/10/2018   ID:  [name], DOB [birth year], MRN [id number]  PCP:  Hulan Fess, MD  Cardiologist:  Shelva Majestic, MD    History of Present Illness:  Carl [name] is a 73 y.o. male who presents to the office today for a 6 month follow-up cardiology evaluation.  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  In 2009 he developed transient numbness of his right face which lasted for several minutes and at that time apparently underwent evaluation and had a normal MRI, MRA, and carotid duplex studies.  He was found to have only mild to moderate plaque in the proximal right internal carotid artery without stenosis.  I had seen him in March 2016 for preoperative cardiology clearance prior to undergoing left knee replacement surgery by Dr. Durward Fortes.  Preoperative ECG suggested possible junctional rhythm which was new from an ECG of 2012 which previously had only shown sinus bradycardia.  He was asymptomatic.  When I saw him, his ECG showed sinus rhythm at 61 bpm with normal intervals and on that ECG P waves were normal and upright inferiorly.  [place] has remained fairly active. In 2019  he had not been exercising as much as he had in the past still working out with a trainer and denied any associated chest pain or palpitations.  He in September 201 19 he began to notice more shortness of breath with walking up steps.  He was  at a friend's house and was told that he appeared to be more short of breath than previously.  I saw him for evaluation of his exertional shortness of breath on October 24, 2017.  At that time, I recommended that he undergo a 2D echo Doppler study and scheduled him for coronary CT angiography.  His echo Doppler study demonstrated hyperdynamic LV function with an EF of 65 to 70%.  Doppler parameters suggest grade 2 diastolic dysfunction and elevated ventricular end-diastolic filling  pressure.  He had mild MR, mild TR, and mild dilation of his left atrium.  Pulmonary pressures were normal.  CT coronary angiography was performed on November 09, 2017.  This was abnormal and demonstrated an elevated calcium score a 25 which is 78th percentile for age and sex.  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  There was greater than 75% calcific plaque in his proximal and mid diagonal 1 vessel.  The circumflex had 50% proximal plaque.  There was 50 to 75% mixed plaque in the OM 2 vessel less than 50% in the OM1 vessel.  His RCA had 50% calcified plaque proximally, 50 to 75% calcified plaque in the mid vessel.  There was mild aortic root dilatation at 4.1 cm.  His CT images were referred for [facility] analysis which were positive in the mid RCA 0.78, the mid LAD 0.78 and in the AV groove circumflex at 0.71.   I saw him in follow-up of the above studies and recommended cardiac catheterization.  Catheterization was done in November 15, 2017 which showed severe multivessel CAD with 85% ostial LAD stenosis, diffuse 75% mid LAD stenosis, total occlusion of the mid AV groove circumflex after the takeoff of the second obtuse marginal vessel with 50% diffuse stenosis in the first large marginal branch, 80 and 90% stenoses in the  second obtuse marginal vessel and extensive collateralization to the distal circumflex and third marginal via the LAD.  He had 1650% mid RCA stenoses and a dominant RCA.  Of note, he had probable high right radial artery takeoff with spasm/coarse stenosis above the elbow and there was retrograde filling of the brachial artery and the catheterization was transition to the femoral approach.  He underwent successful CABG revascularization surgery the following day by Dr. Roxy Horseman on November 16, 2017 with a LIMA graft to his LAD, SVG to first marginal and distal circumflex, and SVG  to the PDA of his RCA with left thigh and calf endoscopic vein harvest.  He developed atrial fibrillation with RVR on postop day 2 and was started on IV amiodarone.  He ultimately converted back to sinus rhythm but developed recurrent AF on postop day 4 on metoprolol and amiodarone and ultimately converted back to sinus rhythm.  He was started on Eliquis on day 5 for anticoagulation.  He was discharged on November 22, 2017.  Approximately 10 days later he started to develop left lower extremity swelling and redness with pain in the back of his calf.  He was evaluated by Ellwood Handler, PA-C at [facility] on December 04, 2017.  There was no sign of infection.  Venous duplex imaging did not reveal any DVT.  He saw Dr. Servando Snare for initial follow-up evaluation on December 14, 2017 at which time he was maintaining sinus rhythm.  He will be participating in cardiac rehab and his orientation is scheduled for February 15, 2018.  He denied any recurrent chest pain.  His breathing has improved with walking.  He went to the beach and was walking at least a mile per day.  He is sleeping better and snoring is less.    Since I saw him in November 2019, he has continued to do exceptionally well.  He has been participating in cardiac rehab.  He is exercising regularly.  He denies chest pain PND orthopnea.  Subsequent laboratory showed further TSH elevation.  His levothyroxine was increased to 75 mcg.  Amiodarone was discontinued due to LFT elevation and his dose of rosuvastatin was reduced to one half of the current dose.  I last saw him in February 2020 at that time he had not had follow-up laboratory.  Participated in cardiac rehabilitation and also had joined a gym.  Since I last saw him, he had had follow-up laboratory which continued to show hypothyroidism and his dose of levothyroxine has gradually been increased.  His most recent laboratory done by Dr. Rex Kras several months ago continues to show a TSH was elevated and his  levothyroxine dose was increased from 100 to 125 mcg.  During the COVID-19 pandemic, he admits to weight gain.  He has not been exercising as much as he had in the past.  Although he feels well, when recently seen by Dr. Durward Fortes he was felt to potentially be more short of breath.  He was advised to see me back for follow-up evaluation.  I scheduled him to undergo repeat laboratory which was done on October 05, 2018.  Hemoglobin 15.4, hematocrit 44.2.  Lipid studies were significantly improved from 6 months previously with total cholesterol now 143, triglycerides 142, HDL 48, LDL 67.  Brain natruretic peptide was 131.  TSH had significantly improved but was still elevated at 15.9.  He had normal renal function with a BUN of 15 creatinine 0.96.  LFTs were significantly improved but minimally increased with an AST of  46 and ALT at 49.  Presently he denies chest pain he denies palpitations.  He presents for reevaluation  Past Medical History:  Diagnosis Date   Anxiety    takes Xanax daily as needed   Arthritis    Cataract    removed both eyes   Depression    takes Celexa daily   Diverticulitis    Diverticulosis    Enlarged prostate    sightly   Esophagitis    Gastric ulcer    GERD (gastroesophageal reflux disease)    takes Protonix daily   Hx of adenomatous colonic polyps    benign   Hyperlipidemia    takes Zocor daily   Hypertension    Insomnia    takes Ambien nightly   Joint pain    Joint swelling     Past Surgical History:  Procedure Laterality Date   CARDIAC CATHETERIZATION     cartliage removed from nose  60yr ago   cataract surgery Bilateral    CERVICAL SPINE SURGERY     COLONOSCOPY     CORONARY ARTERY BYPASS GRAFT N/A 11/16/2017   Procedure: CORONARY ARTERY BYPASS GRAFTING (CABG) x4. LEFT ENDOSCOPIC SAPHENOUS VEIN HARVEST AND MAMMARY ARTERY TAKE DOWN. LIMA TO LAD, SVG TO PDA, SVG TO DISTAL CIRC & OMI.;  Surgeon: GGrace Isaac MD;  [facility]  Service: Open Heart Surgery;  Laterality: N/A;   FOREIGN BODY REMOVAL ESOPHAGEAL     KNEE SURGERY Left    couple of times   LEFT HEART CATH AND CORONARY ANGIOGRAPHY N/A 11/15/2017   Procedure: LEFT HEART CATH AND CORONARY ANGIOGRAPHY;  Surgeon: KTroy Sine MD;  [facility];  Service: Cardiovascular;  Laterality: N/A;   NASAL SINUS SURGERY     POLYPECTOMY     TEE WITHOUT CARDIOVERSION N/A 11/16/2017   Procedure: TRANSESOPHAGEAL ECHOCARDIOGRAM (TEE);  Surgeon: GGrace Isaac MD;  [facility]  Service: Open Heart Surgery;  Laterality: N/A;   TONSILLECTOMY     TOTAL KNEE ARTHROPLASTY Left 06/17/2014   Procedure: TOTAL KNEE ARTHROPLASTY;  Surgeon: PGarald Balding MD;  [facility]  Service: Orthopedics;  Laterality: Left;    Current Medications: Outpatient Medications Prior to Visit  Medication Sig Dispense Refill   acetaminophen (TYLENOL) 325 MG tablet Take 2 tablets (650 mg total) by mouth every 6 (six) hours as needed for mild pain.     acyclovir (ZOVIRAX) 400 MG tablet Take 400 mg by mouth 2 (two) times daily.     aspirin EC 81 MG tablet Take 1 tablet (81 mg total) by mouth daily. 90 tablet 3   citalopram (CELEXA) 20 MG tablet Take 20 mg by mouth daily.     Collagen-Boron-Hyaluronic Acid ([employer] JOINT HEALTH TRIPLE ACTION PO) Take 1 tablet by mouth daily.     Icosapent Ethyl (VASCEPA) 1 g CAPS Take 2 capsules (2 g total) by mouth 2 (two) times daily. 360 capsule 1   Magnesium Carbonate POWD Take 4 g by mouth daily. Natural Vitality Calm     metoprolol tartrate (LOPRESSOR) 25 MG tablet Take 1 tablet (25 mg total) by mouth 2 (two) times daily. 180 tablet 3   Multiple Vitamins-Minerals (PRESERVISION AREDS 2 PO) Take 1 tablet by mouth 2 (two) times daily.     pantoprazole (PROTONIX) 20 MG tablet TAKE 2 TABLETS(40 MG) BY MOUTH DAILY 180 tablet 3   rosuvastatin (CRESTOR) 20 MG tablet Take 1 tablet (20 mg total) by mouth daily. 90 tablet  2    zolpidem (AMBIEN) 10 MG tablet Take 10 mg by mouth at bedtime.      levothyroxine (SYNTHROID) 125 MCG tablet Take 1 tablet (125 mcg total) by mouth daily. 90 tablet 1   apixaban (ELIQUIS) 5 MG TABS tablet Take 1 tablet (5 mg total) by mouth 2 (two) times daily. 60 tablet 6   hydrochlorothiazide (MICROZIDE) 12.5 MG capsule Take 1 capsule (12.5 mg total) by mouth as needed (swelling). 30 capsule 3   No facility-administered medications prior to visit.      Allergies:   Patient has no known allergies.   Social History   Socioeconomic History   Marital status: Single    Spouse name: Not on file   Number of children: 3   Years of education: Not on file   Highest education level: Not on file  Occupational History   Occupation: Owner  Social Designer, fashion/clothing strain: Not very hard   Food insecurity    Worry: Never true    Inability: Never true   Transportation needs    Medical: No    Non-medical: No  Tobacco Use   Smoking status: Never Smoker   Smokeless tobacco: Never Used  Substance and Sexual Activity   Alcohol use: Yes    Alcohol/week: 10.0 standard drinks    Types: 10 Standard drinks or equivalent per week    Comment: 2 drinks 5 nights per week   Drug use: No   Sexual activity: Never  Lifestyle   Physical activity    Days per week: 4 days    Minutes per session: 30 min   Stress: Only a little  Relationships   Press photographer on phone: Not on file    Gets together: Not on file    Attends religious service: Not on file    Active member of club or organization: Not on file    Attends meetings of clubs or organizations: Not on file    Relationship status: Not on file  Other Topics Concern   Not on file  Social History Narrative   Not on file    Additional social history is notable in that he is divorced x2.  He has 3 children and his oldest son lives in [place].  His second son currently had lived [place] and was  married in [place].  In June 2020 he moved back to the [place] area.  His daughter works in [place] as a Government social research officer in a Web designer.  There is no tobacco use.  He drinks alcohol.  Family History:  The patient's family history includes Colon cancer in his paternal grandfather.  Mother died at age 11 with emphysema.  His father died at 51.  He has 2 sisters ages 65 and 59.  ROS General: Negative; No fevers, chills, or night sweats;  HEENT: Decreased hearing with hearing aid in right ear, no sinus congestion, difficulty swallowing Pulmonary: Negative; No cough, wheezing, shortness of breath, hemoptysis Cardiovascular: Negative; No chest pain, presyncope, syncope, palpitations GI: History of diverticular disease and colonic polyps. GU: Remote history of genital herpes Musculoskeletal: Negative; no myalgias, joint pain, or weakness Hematologic/Oncology: Negative; no easy bruising, bleeding Endocrine: Positive for hypothyroidism Neuro: Negative; no changes in balance, headaches Skin: Negative; No rashes or skin lesions Psychiatric:  Sleep: Negative; No snoring, daytime sleepiness, hypersomnolence, bruxism, restless legs, hypnogognic hallucinations, no cataplexy Other comprehensive 14 point system review is negative.   PHYSICAL EXAM:  VS:  BP 110/62    Pulse 60    Temp (!) 97.2 F (36.2 C)    Ht 5' 8"  (1.727 m)    Wt 197 lb (89.4 kg)    BMI 29.95 kg/m     Repeat blood pressure was 118/60 , Wt Readings from Last 3 Encounters:  10/08/18 197 lb (89.4 kg)  08/31/18 201 lb 8 oz (91.4 kg)  04/02/18 188 lb (85.3 kg)    General: Alert, oriented, no distress.  Skin: normal turgor, no rashes, warm and dry HEENT: Normocephalic, atraumatic. Pupils equal round and reactive to light; sclera anicteric; extraocular muscles intact; Nose without nasal septal hypertrophy Mouth/Parynx benign; Mallinpatti scale 3 Neck: No JVD, no carotid bruits; normal carotid upstroke Lungs: clear to  ausculatation and percussion; no wheezing or rales Chest wall: without tenderness to palpitation Heart: PMI not displaced, RRR, s1 s2 normal, 1/6 systolic murmur, no diastolic murmur, no rubs, gallops, thrills, or heaves Abdomen: soft, nontender; no hepatosplenomehaly, BS+; abdominal aorta nontender and not dilated by palpation. Back: no CVA tenderness Pulses 2+ Musculoskeletal: full range of motion, normal strength, no joint deformities Extremities: no clubbing cyanosis or edema, Homan's sign negative  Neurologic: grossly nonfocal; Cranial nerves grossly wnl Psychologic: Normal mood and affect    Studies/Labs Reviewed:   EKG:  EKG is ordered today.  ECG (independently read by me): NSR at 60; isolated PVC; QTc 434 msec.  April 02, 2018 ECG (independently read by me): NSR 62; Nonspecific T change; QTc 452 msec  December 29, 2017 ECG (independently read by me): Sinus bradycardia at 49 bpm.  Nonspecific ST changes.  QTc interval 464 ms.  No ectopy.  ECG (independently read by me): Sinus rhythm at 64 bpm.  Right bundle branch block with repolarization changes.  No ectopy.  October 24, 2017 ECG (independently read by me): Normal sinus rhythm at 62 bpm.  Right bundle branch block with repolarization changes.   ECG  April 18, 2014 revealed normal sinus rhythm.  Right bundle branch block was not present.  Recent Labs: BMP Latest Ref Rng & Units 10/05/2018 04/03/2018 01/02/2018  Glucose 65 - 99 mg/dL 107(H) 95 100(H)  BUN 8 - 27 mg/dL 15 14 10   Creatinine 0.76 - 1.27 mg/dL 0.96 1.12 0.91  BUN/Creat Ratio 10 - 24 16 13 11   Sodium 134 - 144 mmol/L 139 139 137  Potassium 3.5 - 5.2 mmol/L 4.6 4.5 5.0  Chloride 96 - 106 mmol/L 100 100 99  CO2 20 - 29 mmol/L 22 24 23   Calcium 8.6 - 10.2 mg/dL 9.5 9.2 9.2     Hepatic Function Latest Ref Rng & Units 10/05/2018 04/03/2018 02/01/2018  Total Protein 6.0 - 8.5 g/dL 6.7 6.8 6.2  Albumin 3.7 - 4.7 g/dL 4.6 4.3 3.8  AST 0 - 40 IU/L 46(H) 32  62(H)  ALT 0 - 44 IU/L 49(H) 32 75(H)  Alk Phosphatase 39 - 117 IU/L 91 183(H) 249(H)  Total Bilirubin 0.0 - 1.2 mg/dL 0.4 0.4 0.3  Bilirubin, Direct 0.00 - 0.40 mg/dL - - 0.15    CBC Latest Ref Rng & Units 10/05/2018 04/03/2018 01/02/2018  WBC 3.4 - 10.8 x10E3/uL 6.6 6.0 6.2  Hemoglobin 13.0 - 17.7 g/dL 15.4 14.9 13.3  Hematocrit 37.5 - 51.0 % 44.2 44.9 40.1  Platelets 150 - 450 x10E3/uL 238 243 250   Lab Results  Component Value Date   MCV 96 10/05/2018   MCV 91 04/03/2018   MCV 98 (H) 01/02/2018   Lab  Results  Component Value Date   TSH 15.900 (H) 10/05/2018   Lab Results  Component Value Date   HGBA1C 5.4 11/15/2017     BNP    Component Value Date/Time   BNP 131.4 (H) 10/05/2018 1010    ProBNP No results found for: PROBNP   Lipid Panel     Component Value Date/Time   CHOL 143 10/05/2018 1010   TRIG 142 10/05/2018 1010   HDL 48 10/05/2018 1010   CHOLHDL 3.0 10/05/2018 1010   CHOLHDL 3.4 01/29/2007 0410   VLDL 17 01/29/2007 0410   LDLCALC 67 10/05/2018 1010     RADIOLOGY: No results found.   Additional studies/ records that were reviewed today include:  I reviewed his previous evaluation with me from April 18, 2014.   ASSESSMENT:    1. CAD in native artery   2. S/P CABG x 4   3. Hyperlipidemia LDL goal <70   4. Hypothyroidism, unspecified type   5. Essential hypertension   6. PAF (paroxysmal atrial fibrillation) ([place])   7. Anticoagulated     PLAN:  Mr. 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  He has a history of mild hyperlipidemia and remotely had been on simvastatin 40 mg daily and has been followed by Dr. Hulan Fess.   In the fall 2019 he began to notice  slight increase in shortness of breath particularly with walking up steps.  When I saw him for initial evaluation an echo Doppler study revealed hyperdynamic LV function with an EF of 65 to 70% but with grade 2 diastolic dysfunction  and abnormal tissue Doppler.  His CT coronary angiogram was suggestive of significant multivessel CAD and was FFR positive.  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 Roxy Horseman.  His postoperative course was complicated by paroxysmal atrial fibrillation for which he was started on amiodarone and ultimately was discharged on Eliquis.  When I saw him for initial post hospital evaluation he was still on amiodarone 200 mg twice a day, metoprolol tartrate 25 mg twice a day.  His ECG  showed sinus bradycardia  5 weeks status post CABG revascularization.   I decreased amiodarone to 200 mg daily and recommended ultimate titration to and discontinuance over the next 1 to 2 months.  Subsequent laboratory showed elevation of LFTs and further elevation of TSH.  His amiodarone was discontinued.  Levothyroxine dose was increased several occasions subsequently with his last increased to 125 mcg 6 weeks ago.  Most recent TSH from October 05, 2018 shows TSH at 15.9 and I am recommending further titration of levothyroxine to 150 mcg.  He is maintaining sinus rhythm and has not had any recurrent episodes of atrial fibrillation.  His LFTs have improved off amiodarone and most recent laboratory revealed minimal increase with AST at 46 and ALT at 49.  Blood pressure today is controlled on low-dose beta-blocker therapy and HCTZ.  He is not having any exertional chest pain or palpitations.  He has gained weight during this COVID pandemic and had not been going to the gym.  Due to concerns of shortness of breath another physician, I obtained a recent BNP.  This was minimally increased at 131 and may be associated with very mild diastolic dysfunction.  He does not have any overt heart failure and is euvolemic on exam.  I  discussed increasing activity and exercise.  Lipid studies are significantly improved on  rosuvastatin 20 mg with total cholesterol 143, triglycerides 142, HDL 48 and LDL 67.  We will repeat a TSH level in 4 to 6 weeks.  As long as he remains stable I will see him in 4 months for cardiology reevaluation.    Medication Adjustments/Labs and Tests Ordered: Current medicines are reviewed at length with the patient today.  Concerns regarding medicines are outlined above.  Medication changes, Labs and Tests ordered today are listed in the Patient Instructions below. Patient Instructions  Medication Instructions:  Increase Levothyroxine 150 mcg daily. If you need a refill on your cardiac medications before your next appointment, please call your pharmacy.   Lab work: TSH in 4-6 weeks. If you have labs (blood work) drawn today and your tests are completely normal, you will receive your results only by:  [place] (if you have MyChart) OR  A paper copy in the mail If you have any lab test that is abnormal or we need to change your treatment, we will call you to review the results.  Follow-Up: At [facility], you and your health needs are our priority.  As part of our continuing mission to provide you with exceptional heart care, we have created designated Provider Care Teams.  These Care Teams include your primary Cardiologist (physician) and Advanced Practice Providers (APPs -  Physician Assistants and Nurse Practitioners) who all work together to provide you with the care you need, when you need it. You will need a follow up appointment in 4 months.  You may see Shelva Majestic, MD or one of the following Advanced Practice Providers on your designated Care Team: Almyra Deforest, PA-C  Fabian Sharp, [place]       Signed, Shelva Majestic, MD  10/10/2018 8:27 AM    [place] [address] [place], [place], [place]  [address] Phone: [phone]

## 2018-10-10 ENCOUNTER — Encounter: Payer: Self-pay | Admitting: Cardiovascular Disease

## 2018-10-12 ENCOUNTER — Other Ambulatory Visit: Payer: Self-pay | Admitting: Cardiovascular Disease

## 2018-10-12 MED ORDER — APIXABAN 5 MG PO TABS: 5.0000 mg | ORAL_TABLET | Freq: Two times a day (BID) | ORAL | 1 refills | Status: DC

## 2018-10-12 NOTE — Telephone Encounter (Signed)
°*  STAT* If patient is at the pharmacy, call can be transferred to refill team.   1. Which medications need to be refilled? (please list name of each medication and dose if known)  apixaban (ELIQUIS) 5 MG TABS tablet   2. Which pharmacy/location (including street and city if local pharmacy) is medication to be sent to?   [facility] # [phone] [place], [place], [place] [address]  [phone]  3. Do they need a 30 day or 90 day supply? 30  [place] He had an appt with [name] on Monday, and forgot to ask for a refill of his eliquis.   Pt is out of medication

## 2018-10-12 NOTE — Telephone Encounter (Signed)
51M 89.4 kg, SCR 0.96 (09/2018), LOV TK 09/2018

## 2018-10-12 NOTE — Telephone Encounter (Signed)
Refill request

## 2018-10-23 ENCOUNTER — Ambulatory Visit: Payer: PPO | Admitting: Cardiovascular Disease

## 2018-10-26 ENCOUNTER — Other Ambulatory Visit: Payer: Self-pay

## 2018-10-26 MED ORDER — VASCEPA 1 G PO CAPS: 2.0000 g | ORAL_CAPSULE | Freq: Two times a day (BID) | ORAL | 1 refills | Status: DC

## 2018-10-29 ENCOUNTER — Ambulatory Visit: Payer: PPO

## 2018-10-29 ENCOUNTER — Ambulatory Visit (INDEPENDENT_AMBULATORY_CARE_PROVIDER_SITE_OTHER): Payer: PPO | Admitting: Cardiovascular Disease

## 2018-10-29 ENCOUNTER — Other Ambulatory Visit: Payer: Self-pay

## 2018-10-29 ENCOUNTER — Encounter: Payer: Self-pay | Admitting: Cardiovascular Disease

## 2018-10-29 VITALS — BP 139/95 | HR 130 | Ht 68.0 in | Wt 202.0 lb

## 2018-10-29 DIAGNOSIS — Z7901 Long term (current) use of anticoagulants: Secondary | ICD-10-CM

## 2018-10-29 DIAGNOSIS — Z951 Presence of aortocoronary bypass graft: Secondary | ICD-10-CM | POA: Diagnosis not present

## 2018-10-29 DIAGNOSIS — Z79899 Other long term (current) drug therapy: Secondary | ICD-10-CM | POA: Diagnosis not present

## 2018-10-29 DIAGNOSIS — E785 Hyperlipidemia, unspecified: Secondary | ICD-10-CM | POA: Diagnosis not present

## 2018-10-29 DIAGNOSIS — I480 Paroxysmal atrial fibrillation: Secondary | ICD-10-CM

## 2018-10-29 DIAGNOSIS — I4892 Unspecified atrial flutter: Secondary | ICD-10-CM | POA: Diagnosis not present

## 2018-10-29 DIAGNOSIS — I2510 Atherosclerotic heart disease of native coronary artery without angina pectoris: Secondary | ICD-10-CM

## 2018-10-29 DIAGNOSIS — E039 Hypothyroidism, unspecified: Secondary | ICD-10-CM

## 2018-10-29 DIAGNOSIS — I4891 Unspecified atrial fibrillation: Secondary | ICD-10-CM | POA: Diagnosis not present

## 2018-10-29 MED ORDER — METOPROLOL TARTRATE 50 MG PO TABS: 25.0000 mg | ORAL_TABLET | Freq: Two times a day (BID) | ORAL | 6 refills | Status: DC

## 2018-10-29 NOTE — Progress Notes (Signed)
Cardiology Office Note    Date:  10/29/2018   ID:  ROBERTLEE ROGACKI, DOB [birth year], MRN [id number]  PCP:  Hulan Fess, MD  Cardiologist:  Shelva Majestic, MD    History of Present Illness:  Carl [name] is a 73 y.o. male who presents to the office today and is seen as an add-on after he called me today stating that his Apple Watch was recording that his heart rate was in the 135 range.    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  In 2009 he developed transient numbness of his right face which lasted for several minutes and at that time apparently underwent evaluation and had a normal MRI, MRA, and carotid duplex studies.  He was found to have only mild to moderate plaque in the proximal right internal carotid artery without stenosis.  I had seen him in March 2016 for preoperative cardiology clearance prior to undergoing left knee replacement surgery by Dr. Durward Fortes.  Preoperative ECG suggested possible junctional rhythm which was new from an ECG of 2012 which previously had only shown sinus bradycardia.  He was asymptomatic.  When I saw him, his ECG showed sinus rhythm at 61 bpm with normal intervals and on that ECG P waves were normal and upright inferiorly.  Oather has remained fairly active. In 2019  he had not been exercising as much as he had in the past still working out with a trainer and denied any associated chest pain or palpitations.  He in September 201 19 he began to notice more shortness of breath with walking up steps.  He was  at a friend's house and was told that he appeared to be more short of breath than previously.  I saw him for evaluation of his exertional shortness of breath on October 24, 2017.  At that time, I recommended that he undergo a 2D echo Doppler study and scheduled him for coronary CT angiography.  His echo Doppler study demonstrated hyperdynamic LV function with an EF of 65 to 70%.  Doppler  parameters suggest grade 2 diastolic dysfunction and elevated ventricular end-diastolic filling pressure.  He had mild MR, mild TR, and mild dilation of his left atrium.  Pulmonary pressures were normal.  CT coronary angiography was performed on November 09, 2017.  This was abnormal and demonstrated an elevated calcium score a 25 which is 78th percentile for age and sex.  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  There was greater than 75% calcific plaque in his proximal and mid diagonal 1 vessel.  The circumflex had 50% proximal plaque.  There was 50 to 75% mixed plaque in the OM 2 vessel less than 50% in the OM1 vessel.  His RCA had 50% calcified plaque proximally, 50 to 75% calcified plaque in the mid vessel.  There was mild aortic root dilatation at 4.1 cm.  His CT images were referred for [facility] analysis which were positive in the mid RCA 0.78, the mid LAD 0.78 and in the AV groove circumflex at 0.71.   I saw him in follow-up of the above studies and recommended cardiac catheterization.  Catheterization was done in November 15, 2017 which showed severe multivessel CAD with 85% ostial LAD stenosis, diffuse 75% mid LAD stenosis, total occlusion of the mid AV groove circumflex after the takeoff  of the second obtuse marginal vessel with 50% diffuse stenosis in the first large marginal branch, 80 and 90% stenoses in the second obtuse marginal vessel and extensive collateralization to the distal circumflex and third marginal via the LAD.  He had 1650% mid RCA stenoses and a dominant RCA.  Of note, he had probable high right radial artery takeoff with spasm/coarse stenosis above the elbow and there was retrograde filling of the brachial artery and the catheterization was transition to the femoral approach.  He underwent successful CABG revascularization surgery the following day by Dr. Roxy Horseman on  November 16, 2017 with a LIMA graft to his LAD, SVG to first marginal and distal circumflex, and SVG to the PDA of his RCA with left thigh and calf endoscopic vein harvest.  He developed atrial fibrillation with RVR on postop day 2 and was started on IV amiodarone.  He ultimately converted back to sinus rhythm but developed recurrent AF on postop day 4 on metoprolol and amiodarone and ultimately converted back to sinus rhythm.  He was started on Eliquis on day 5 for anticoagulation.  He was discharged on November 22, 2017.  Approximately 10 days later he started to develop left lower extremity swelling and redness with pain in the back of his calf.  He was evaluated by Ellwood Handler, PA-C at [facility] on December 04, 2017.  There was no sign of infection.  Venous duplex imaging did not reveal any DVT.  He saw Dr. Servando Snare for initial follow-up evaluation on December 14, 2017 at which time he was maintaining sinus rhythm.  He will be participating in cardiac rehab and his orientation is scheduled for February 15, 2018.  He denied any recurrent chest pain.  His breathing has improved with walking.  He went to the beach and was walking at least a mile per day.  He is sleeping better and snoring is less.    Since I saw him in November 2019, he has continued to do exceptionally well.  He has been participating in cardiac rehab.  He is exercising regularly.  He denies chest pain PND orthopnea.  Subsequent laboratory showed further TSH elevation.  His levothyroxine was increased to 75 mcg.  Amiodarone was discontinued due to LFT elevation and his dose of rosuvastatin was reduced to one half of the current dose.  I last saw him in February 2020 at that time he had not had follow-up laboratory.  Participated in cardiac rehabilitation and also had joined a gym.  He had had follow-up laboratory which continued to show hypothyroidism and his dose of levothyroxine has gradually been increased.  Recent laboratory done by Dr. Rex Kras  several months ago continues to show a TSH was elevated and his levothyroxine dose was increased from 100 to 125 mcg.  During the COVID-19 pandemic, he admits to weight gain.  He has not been exercising as much as he had in the past.  Although he felt well, when recently seen by Dr. Durward Fortes he was felt to potentially be more short of breath.  He was advised to see me back for follow-up evaluation.  I scheduled him to undergo repeat laboratory which was done on October 05, 2018.  Hemoglobin 15.4, hematocrit 44.2.  Lipid studies were significantly improved from 6 months previously with total cholesterol now 143, triglycerides 142, HDL 48, LDL 67.  Brain natruretic peptide was 131.  TSH had significantly improved but was still elevated at 15.9.  He had normal renal function with a BUN of  15 creatinine 0.96.  LFTs were significantly improved but minimally increased with an AST of 46 and ALT at 49.  He denied any chest pain or awareness of palpitations.  I saw him for reevaluation on October 08, 2018.  During that evaluation, I reviewed his laboratory and felt he was euvolemic.  His ECG showed normal sinus rhythm at 60 bpm with an isolated PVC.  His QTc interval was 434 ms.  With his continued TSH elevation, levothyroxine was further titrated to 150 mcg.  Subsequently, Carl [name] has continued to feel well.  He denied any awareness of palpitations but today when checking his watch he saw report that his heart rate was increased in the upper 130s.  He was unaware of this irregularity.  Upon further questioning, he had to go out of town over the last several days and believes he may have missed taking his morning metoprolol dose on Friday and Saturday and he could not remember if he had taken it yesterday morning.  He was supposed to be taken metoprolol tartrate 25 mg twice a day, HCTZ on a as needed basis which he has not needed.  He continues to be on Eliquis 5 mg twice a day and is tolerated the levothyroxine 150 mcg dose.   He is now on the vascepa which was just renewed last week 2 capsules twice a day, rosuvastatin 20 mg daily for mixed hyperlipidemia.  After receiving his phone call, I advised that he come to the office to check an EKG.  With his ECG showing atrial flutter at a rate of 130 he is now added onto my schedule for further evaluation.   Past Medical History:  Diagnosis Date   Anxiety    takes Xanax daily as needed   Arthritis    Cataract    removed both eyes   Depression    takes Celexa daily   Diverticulitis    Diverticulosis    Enlarged prostate    sightly   Esophagitis    Gastric ulcer    GERD (gastroesophageal reflux disease)    takes Protonix daily   Hx of adenomatous colonic polyps    benign   Hyperlipidemia    takes Zocor daily   Hypertension    Insomnia    takes Ambien nightly   Joint pain    Joint swelling     Past Surgical History:  Procedure Laterality Date   CARDIAC CATHETERIZATION     cartliage removed from nose  71yr ago   cataract surgery Bilateral    CERVICAL SPINE SURGERY     COLONOSCOPY     CORONARY ARTERY BYPASS GRAFT N/A 11/16/2017   Procedure: CORONARY ARTERY BYPASS GRAFTING (CABG) x4. LEFT ENDOSCOPIC SAPHENOUS VEIN HARVEST AND MAMMARY ARTERY TAKE DOWN. LIMA TO LAD, SVG TO PDA, SVG TO DISTAL CIRC & OMI.;  Surgeon: GGrace Isaac MD;  [facility]  Service: Open Heart Surgery;  Laterality: N/A;   FOREIGN BODY REMOVAL ESOPHAGEAL     KNEE SURGERY Left    couple of times   LEFT HEART CATH AND CORONARY ANGIOGRAPHY N/A 11/15/2017   Procedure: LEFT HEART CATH AND CORONARY ANGIOGRAPHY;  Surgeon: KTroy Sine MD;  [facility];  Service: Cardiovascular;  Laterality: N/A;   NASAL SINUS SURGERY     POLYPECTOMY     TEE WITHOUT CARDIOVERSION N/A 11/16/2017   Procedure: TRANSESOPHAGEAL ECHOCARDIOGRAM (TEE);  Surgeon: GGrace Isaac MD;  [facility]  Service: Open Heart Surgery;  Laterality: N/A;    TONSILLECTOMY     TOTAL KNEE ARTHROPLASTY Left 06/17/2014   Procedure: TOTAL KNEE ARTHROPLASTY;  Surgeon: Garald Balding, MD;  Location: [facility];  Service: Orthopedics;  Laterality: Left;    Current Medications: Outpatient Medications Prior to Visit  Medication Sig Dispense Refill   acetaminophen (TYLENOL) 325 MG tablet Take 2 tablets (650 mg total) by mouth every 6 (six) hours as needed for mild pain.     acyclovir (ZOVIRAX) 400 MG tablet Take 400 mg by mouth 2 (two) times daily.     apixaban (ELIQUIS) 5 MG TABS tablet Take 1 tablet (5 mg total) by mouth 2 (two) times daily. 180 tablet 1   aspirin EC 81 MG tablet Take 1 tablet (81 mg total) by mouth daily. 90 tablet 3   citalopram (CELEXA) 20 MG tablet Take 20 mg by mouth daily.     Collagen-Boron-Hyaluronic Acid ([employer] JOINT HEALTH TRIPLE ACTION PO) Take 1 tablet by mouth daily.     Icosapent Ethyl (VASCEPA) 1 g CAPS Take 2 capsules (2 g total) by mouth 2 (two) times daily. 360 capsule 1   levothyroxine (SYNTHROID) 150 MCG tablet Take 1 tablet (150 mcg total) by mouth daily. 90 tablet 1   Magnesium Carbonate POWD Take 4 g by mouth daily. Natural Vitality Calm     Multiple Vitamins-Minerals (PRESERVISION AREDS 2 PO) Take 1 tablet by mouth 2 (two) times daily.     pantoprazole (PROTONIX) 20 MG tablet TAKE 2 TABLETS(40 MG) BY MOUTH DAILY 180 tablet 3   rosuvastatin (CRESTOR) 20 MG tablet Take 1 tablet (20 mg total) by mouth daily. 90 tablet 2   zolpidem (AMBIEN) 10 MG tablet Take 10 mg by mouth at bedtime.      metoprolol tartrate (LOPRESSOR) 25 MG tablet Take 1 tablet (25 mg total) by mouth 2 (two) times daily. 180 tablet 3   hydrochlorothiazide (MICROZIDE) 12.5 MG capsule Take 1 capsule (12.5 mg total) by mouth as needed (swelling). 30 capsule 3   No facility-administered medications prior to visit.      Allergies:   Patient has no known allergies.   Social History   Socioeconomic History   Marital status: Single     Spouse name: Not on file   Number of children: 3   Years of education: Not on file   Highest education level: Not on file  Occupational History   Occupation: Owner  Social Designer, fashion/clothing strain: Not very hard   Food insecurity    Worry: Never true    Inability: Never true   Transportation needs    Medical: No    Non-medical: No  Tobacco Use   Smoking status: Never Smoker   Smokeless tobacco: Never Used  Substance and Sexual Activity   Alcohol use: Yes    Alcohol/week: 10.0 standard drinks    Types: 10 Standard drinks or equivalent per week    Comment: 2 drinks 5 nights per week   Drug use: No   Sexual activity: Never  Lifestyle   Physical activity    Days per week: 4 days    Minutes per session: 30 min   Stress: Only a little  Relationships   Social connections    Talks on phone: Not on file    Gets together: Not on file    Attends religious service: Not on file    Active member of club or organization: Not on file    Attends meetings of clubs  or organizations: Not on file    Relationship status: Not on file  Other Topics Concern   Not on file  Social History Narrative   Not on file    Additional social history is notable in that he is divorced x2.  He has 3 children and his oldest son lives in [place].  His second son currently had lived [place] and was married in [place].  In June 2020 he moved back to the [place] area.  His daughter works in [place] as a Government social research officer in a Web designer.  There is no tobacco use.  He drinks alcohol.  Family History:  The patient's family history includes Colon cancer in his paternal grandfather.  Mother died at age 27 with emphysema.  His father died at 23.  He has 2 sisters ages 71 and 23.  ROS General: Negative; No fevers, chills, or night sweats;  HEENT: Decreased hearing with hearing aid in right ear, no sinus congestion, difficulty swallowing Pulmonary: Negative; No cough,  wheezing, shortness of breath, hemoptysis Cardiovascular: Negative; No chest pain, presyncope, syncope, palpitations GI: History of diverticular disease and colonic polyps. GU: Remote history of genital herpes Musculoskeletal: Negative; no myalgias, joint pain, or weakness Hematologic/Oncology: Negative; no easy bruising, bleeding Endocrine: Positive for hypothyroidism Neuro: Negative; no changes in balance, headaches Skin: Negative; No rashes or skin lesions Psychiatric:  Sleep: Negative; No snoring, daytime sleepiness, hypersomnolence, bruxism, restless legs, hypnogognic hallucinations, no cataplexy Other comprehensive 14 point system review is negative.   PHYSICAL EXAM:   VS:  BP (!) 139/95    Pulse (!) 130    Ht 5' 8"  (1.727 m)    Wt 202 lb (91.6 kg)    BMI 30.71 kg/m    BP blood pressure by me was 142/90 , Wt Readings from Last 3 Encounters:  10/29/18 202 lb (91.6 kg)  10/08/18 197 lb (89.4 kg)  08/31/18 201 lb 8 oz (91.4 kg)      Physical Exam BP (!) 139/95    Pulse (!) 130    Ht 5' 8"  (1.727 m)    Wt 202 lb (91.6 kg)    BMI 30.71 kg/m  General: Alert, oriented, no distress.  Skin: normal turgor, no rashes, warm and dry HEENT: Normocephalic, atraumatic. Pupils equal round and reactive to light; sclera anicteric; extraocular muscles intact;  Nose without nasal septal hypertrophy Mouth/Parynx benign; Mallinpatti scale 3 Neck: No JVD, no carotid bruits; normal carotid upstroke Lungs: clear to ausculatation and percussion; no wheezing or rales Chest wall: without tenderness to palpitation Heart: PMI not displaced, tachycardic with mild irregularity in the 130s, s1 s2 normal, 1/6 systolic murmur, no diastolic murmur, no rubs, gallops, thrills, or heaves Abdomen: soft, nontender; no hepatosplenomehaly, BS+; abdominal aorta nontender and not dilated by palpation. Back: no CVA tenderness Pulses 2+ Musculoskeletal: full range of motion, normal strength, no joint  deformities Extremities: no clubbing cyanosis or edema, Homan's sign negative  Neurologic: grossly nonfocal; Cranial nerves grossly wnl Psychologic: Normal mood and affect    Studies/Labs Reviewed:   ECG (independently read by me): Atrial flutter with variable block 130 bpm.  No specific ST-T changes  October 08, 2018 EKG:  EKG is ordered today.  ECG (independently read by me): NSR at 60; isolated PVC; QTc 434 msec.  April 02, 2018 ECG (independently read by me): NSR 62; Nonspecific T change; QTc 452 msec  December 29, 2017 ECG (independently read by me): Sinus bradycardia at 49 bpm.  Nonspecific ST  changes.  QTc interval 464 ms.  No ectopy.  ECG (independently read by me): Sinus rhythm at 64 bpm.  Right bundle branch block with repolarization changes.  No ectopy.  October 24, 2017 ECG (independently read by me): Normal sinus rhythm at 62 bpm.  Right bundle branch block with repolarization changes.   ECG  April 18, 2014 revealed normal sinus rhythm.  Right bundle branch block was not present.  Recent Labs: BMP Latest Ref Rng & Units 10/05/2018 04/03/2018 01/02/2018  Glucose 65 - 99 mg/dL 107(H) 95 100(H)  BUN 8 - 27 mg/dL 15 14 10   Creatinine 0.76 - 1.27 mg/dL 0.96 1.12 0.91  BUN/Creat Ratio 10 - 24 16 13 11   Sodium 134 - 144 mmol/L 139 139 137  Potassium 3.5 - 5.2 mmol/L 4.6 4.5 5.0  Chloride 96 - 106 mmol/L 100 100 99  CO2 20 - 29 mmol/L 22 24 23   Calcium 8.6 - 10.2 mg/dL 9.5 9.2 9.2     Hepatic Function Latest Ref Rng & Units 10/05/2018 04/03/2018 02/01/2018  Total Protein 6.0 - 8.5 g/dL 6.7 6.8 6.2  Albumin 3.7 - 4.7 g/dL 4.6 4.3 3.8  AST 0 - 40 IU/L 46(H) 32 62(H)  ALT 0 - 44 IU/L 49(H) 32 75(H)  Alk Phosphatase 39 - 117 IU/L 91 183(H) 249(H)  Total Bilirubin 0.0 - 1.2 mg/dL 0.4 0.4 0.3  Bilirubin, Direct 0.00 - 0.40 mg/dL - - 0.15    CBC Latest Ref Rng & Units 10/05/2018 04/03/2018 01/02/2018  WBC 3.4 - 10.8 x10E3/uL 6.6 6.0 6.2  Hemoglobin 13.0 - 17.7 g/dL 15.4  14.9 13.3  Hematocrit 37.5 - 51.0 % 44.2 44.9 40.1  Platelets 150 - 450 x10E3/uL 238 243 250   Lab Results  Component Value Date   MCV 96 10/05/2018   MCV 91 04/03/2018   MCV 98 (H) 01/02/2018   Lab Results  Component Value Date   TSH 15.900 (H) 10/05/2018   Lab Results  Component Value Date   HGBA1C 5.4 11/15/2017     BNP    Component Value Date/Time   BNP 131.4 (H) 10/05/2018 1010    ProBNP No results found for: PROBNP   Lipid Panel     Component Value Date/Time   CHOL 143 10/05/2018 1010   TRIG 142 10/05/2018 1010   HDL 48 10/05/2018 1010   CHOLHDL 3.0 10/05/2018 1010   CHOLHDL 3.4 01/29/2007 0410   VLDL 17 01/29/2007 0410   LDLCALC 67 10/05/2018 1010     RADIOLOGY: No results found.   Additional studies/ records that were reviewed today include:  I reviewed his previous evaluation with me from April 18, 2014.   ASSESSMENT:    1. Atrial flutter with rapid ventricular response ([place])   2. Medication management   3. CAD in native artery   4. S/P CABG x 4   5. Hypothyroidism, unspecified type   6. Hyperlipidemia LDL goal <70   7. PAF (paroxysmal atrial fibrillation) ([place])   8. 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  He has a history of mild hyperlipidemia and remotely had been on simvastatin 40 mg daily and has been followed by Dr. Hulan Fess.  In the fall 2019 he began to notice mild shortness of breath particularly with walking up steps.  When I saw him for initial evaluation an echo Doppler study revealed hyperdynamic LV function with  an EF of 65 to 70% but with grade 2 diastolic dysfunction and abnormal tissue Doppler.  His CT coronary angiogram was suggestive of significant multivessel CAD and was FFR positive.  He was found to have significant multivessel CAD at catheterization leading to urgent CABG revascularization surgery the following day which was  successfully done by Dr. Roxy Horseman.  His postoperative course was complicated by paroxysmal atrial fibrillation for which he was started on amiodarone and ultimately was discharged on Eliquis.  When I saw him for initial post hospital evaluation he was still on amiodarone 200 mg twice a day, metoprolol tartrate 25 mg twice a day.  His ECG  showed sinus bradycardia 5 weeks status post CABG revascularization; amiodarone was decreased to 200 mg daily and subsequently discontinued when subsequent laboratory showed LFT elevation and further increase in his TSH level.  Levothyroxine dose was increased several occasions earlier this summer was increased to 125 mcg following an increased level by his primary physician.  Thet recent TSH from October 05, 2018 showed improvement but TSH was still increased at 15.9 and I am  further titrated levothyroxine to 150 mcg.  He was maintaining sinus rhythm and denied any awareness of recurrent palpitations or arrhythmia.  Apparently, he had to go out of town over the last several days and inadvertently may have missed taking his morning dose of metoprolol which she had been taking 25 mg twice a day.  He believes he missed his dose on day and Saturday and possibly Sunday morning.  He had taken his dose last evening and today but today without having any symptoms noticed that his apple watch was registering a heart rate in the upper 130s.  His ECG in the office now shows atrial flutter with variable block at a rate of 130 bpm.  I suspect this may be contributed by his inadvertent duction of beta-blocker therapy.  However presently I have recommended he  increase metoprolol to 50 mg twice a day.  I will check a bmet, magnesium and TSH level today.  Continue to monitor his heart rate.  If he notices on his apple watch that his pulse is now in the low 50s he will reduce his dose back to 25 mg twice a day.  I will add him onto my schedule in 1 week for follow-up evaluation.  He will call me if  he notices any significant issues in the interim.  I will await the result of the TSH today prior to making any further termination of his levothyroxine dose.  I will will come in and see him in the office.  Continues to be on rosuvastatin 20 mg with most recent lipid studies excellent with an LDL cholesterol at 67, total cholesterol 143, triglycerides 142 and HDL 48.  He is tolerating the Vascepa 2 capsules twice a day with benefit.   Medication Adjustments/Labs and Tests Ordered: Current medicines are reviewed at length with the patient today.  Concerns regarding medicines are outlined above.  Medication changes, Labs and Tests ordered today are listed in the Patient Instructions below. Patient Instructions  Medication Instructions:  INCREASE METOPROLOL 50MG TWICE DAILY IF HR <50 OR LOW 50's CALL AND LET us KNOW WE WILL HAVE TO ADJUST DOSE AGAIN If you need a refill on your cardiac medications before your next appointment, please call your pharmacy.  Labwork: MAG, BMET AND TSH TODAY HERE IN OUR OFFICE AT [facility]    Take the provided lab slips with you to the lab for  your blood draw.   When you have your labs (blood work) drawn today and your tests are completely normal, you will receive your results only by MyChart Message (if you have MyChart) -OR-  A paper copy in the mail.  If you have any lab test that is abnormal or we need to change your treatment, we will call you to review these results.  Follow-Up: You will need a follow up appointment on Monday 11-05-2018 @130PM  WEARING A MASK.  You may see Shelva Majestic, MD or one of the following Advanced Practice Providers on your designated Care Team:  Almyra Deforest, PA-C  Fabian Sharp, PA-C     At [facility], you and your health needs are our priority.  As part of our continuing mission to provide you with exceptional heart care, we have created designated Provider Care Teams.  These Care Teams include your primary Cardiologist (physician) and  Advanced Practice Providers (APPs -  Physician Assistants and Nurse Practitioners) who all work together to provide you with the care you need, when you need it.  Thank you for choosing [facility]!!        Signed, Shelva Majestic, MD  10/29/2018 7:02 PM    [place] [address] [place], [place], [place]  [address] Phone: [phone]

## 2018-10-29 NOTE — Patient Instructions (Signed)
Medication Instructions:  INCREASE METOPROLOL 50MG  TWICE DAILY IF HR <50 OR LOW 50's CALL AND LET us KNOW WE WILL HAVE TO ADJUST DOSE AGAIN If you need a refill on your cardiac medications before your next appointment, please call your pharmacy.  Labwork: MAG, BMET AND TSH TODAY HERE IN OUR OFFICE AT [facility]    Take the provided lab slips with you to the lab for your blood draw.   When you have your labs (blood work) drawn today and your tests are completely normal, you will receive your results only by MyChart Message (if you have MyChart) -OR-  A paper copy in the mail.  If you have any lab test that is abnormal or we need to change your treatment, we will call you to review these results.  Follow-Up: You will need a follow up appointment on Monday 11-05-2018 @130PM  WEARING A MASK.  You may see Shelva Majestic, MD or one of the following Advanced Practice Providers on your designated Care Team:  Almyra Deforest, PA-C  Fabian Sharp, PA-C     At [facility], you and your health needs are our priority.  As part of our continuing mission to provide you with exceptional heart care, we have created designated Provider Care Teams.  These Care Teams include your primary Cardiologist (physician) and Advanced Practice Providers (APPs -  Physician Assistants and Nurse Practitioners) who all work together to provide you with the care you need, when you need it.  Thank you for choosing [facility] at [facility]!!

## 2018-10-30 LAB — MAGNESIUM: Magnesium: 2.3 mg/dL (ref 1.6–2.3)

## 2018-10-30 LAB — BASIC METABOLIC PANEL
BUN/Creatinine Ratio: 15 (ref 10–24)
BUN: 14 mg/dL (ref 8–27)
CO2: 22 mmol/L (ref 20–29)
Calcium: 9.5 mg/dL (ref 8.6–10.2)
Chloride: 100 mmol/L (ref 96–106)
Creatinine, Ser: 0.93 mg/dL (ref 0.76–1.27)
GFR calc Af Amer: 93 mL/min/{1.73_m2} (ref >59)
GFR calc non Af Amer: 81 mL/min/{1.73_m2} (ref >59)
Glucose: 92 mg/dL (ref 65–99)
Potassium: 4.7 mmol/L (ref 3.5–5.2)
Sodium: 137 mmol/L (ref 134–144)

## 2018-10-30 LAB — TSH: TSH: 5.95 u[IU]/mL — ABNORMAL HIGH (ref 0.450–4.500)

## 2018-10-31 DIAGNOSIS — N486 Induration penis plastica: Secondary | ICD-10-CM | POA: Diagnosis not present

## 2018-10-31 DIAGNOSIS — N3941 Urge incontinence: Secondary | ICD-10-CM | POA: Diagnosis not present

## 2018-10-31 DIAGNOSIS — N5201 Erectile dysfunction due to arterial insufficiency: Secondary | ICD-10-CM | POA: Diagnosis not present

## 2018-10-31 DIAGNOSIS — N401 Enlarged prostate with lower urinary tract symptoms: Secondary | ICD-10-CM | POA: Diagnosis not present

## 2018-10-31 DIAGNOSIS — R3121 Asymptomatic microscopic hematuria: Secondary | ICD-10-CM | POA: Diagnosis not present

## 2018-11-02 DIAGNOSIS — N5201 Erectile dysfunction due to arterial insufficiency: Secondary | ICD-10-CM | POA: Diagnosis not present

## 2018-11-05 ENCOUNTER — Ambulatory Visit (INDEPENDENT_AMBULATORY_CARE_PROVIDER_SITE_OTHER): Payer: PPO | Admitting: Cardiovascular Disease

## 2018-11-05 ENCOUNTER — Other Ambulatory Visit: Payer: Self-pay

## 2018-11-05 VITALS — BP 142/80 | HR 115 | Ht 69.0 in | Wt 202.0 lb

## 2018-11-05 DIAGNOSIS — I2510 Atherosclerotic heart disease of native coronary artery without angina pectoris: Secondary | ICD-10-CM

## 2018-11-05 DIAGNOSIS — Z7901 Long term (current) use of anticoagulants: Secondary | ICD-10-CM | POA: Diagnosis not present

## 2018-11-05 DIAGNOSIS — Z951 Presence of aortocoronary bypass graft: Secondary | ICD-10-CM | POA: Diagnosis not present

## 2018-11-05 DIAGNOSIS — E039 Hypothyroidism, unspecified: Secondary | ICD-10-CM | POA: Diagnosis not present

## 2018-11-05 DIAGNOSIS — E785 Hyperlipidemia, unspecified: Secondary | ICD-10-CM | POA: Diagnosis not present

## 2018-11-05 DIAGNOSIS — I4892 Unspecified atrial flutter: Secondary | ICD-10-CM | POA: Diagnosis not present

## 2018-11-05 DIAGNOSIS — I10 Essential (primary) hypertension: Secondary | ICD-10-CM | POA: Diagnosis not present

## 2018-11-05 MED ORDER — METOPROLOL TARTRATE 25 MG PO TABS: 25.0000 mg | ORAL_TABLET | Freq: Two times a day (BID) | ORAL | 3 refills | Status: DC

## 2018-11-05 MED ORDER — METOPROLOL TARTRATE 50 MG PO TABS: 50.0000 mg | ORAL_TABLET | Freq: Two times a day (BID) | ORAL | 4 refills | Status: DC

## 2018-11-05 NOTE — Progress Notes (Signed)
Cardiology Office Note    Date:  11/06/2018   ID:  Carl [name], DOB 03-25-1945, MRN [id number]  PCP:  Hulan Fess, MD  Cardiologist:  Shelva Majestic, MD    History of Present Illness:  Carl [name] is a 73 y.o. male who presents to the office today for a one-week follow-up evaluation for being found to be in atrial fibrillation when seen as an add-on last week.    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  In 2009 he developed transient numbness of his right face which lasted for several minutes and at that time apparently underwent evaluation and had a normal MRI, MRA, and carotid duplex studies.  He was found to have only mild to moderate plaque in the proximal right internal carotid artery without stenosis.  I had seen him in March 2016 for preoperative cardiology clearance prior to undergoing left knee replacement surgery by Dr. Durward Fortes.  Preoperative ECG suggested possible junctional rhythm which was new from an ECG of 2012 which previously had only shown sinus bradycardia.  He was asymptomatic.  When I saw him, his ECG showed sinus rhythm at 61 bpm with normal intervals and on that ECG P waves were normal and upright inferiorly.  Carl [name] has remained fairly active. In 2019  he had not been exercising as much as he had in the past still working out with a trainer and denied any associated chest pain or palpitations.  He in September 201 19 he began to notice more shortness of breath with walking up steps.  He was  at a friend's house and was told that he appeared to be more short of breath than previously.  I saw him for evaluation of his exertional shortness of breath on October 24, 2017.  At that time, I recommended that he undergo a 2D echo Doppler study and scheduled him for coronary CT angiography.  His echo Doppler study demonstrated hyperdynamic LV function with an EF of 65 to 70%.  Doppler parameters suggest grade 2  diastolic dysfunction and elevated ventricular end-diastolic filling pressure.  He had mild MR, mild TR, and mild dilation of his left atrium.  Pulmonary pressures were normal.  CT coronary angiography was performed on November 09, 2017.  This was abnormal and demonstrated an elevated calcium score a 25 which is 78th percentile for age and sex.  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  There was greater than 75% calcific plaque in his proximal and mid diagonal 1 vessel.  The circumflex had 50% proximal plaque.  There was 50 to 75% mixed plaque in the OM 2 vessel less than 50% in the OM1 vessel.  His RCA had 50% calcified plaque proximally, 50 to 75% calcified plaque in the mid vessel.  There was mild aortic root dilatation at 4.1 cm.  His CT images were referred for [facility] analysis which were positive in the mid RCA 0.78, the mid LAD 0.78 and in the AV groove circumflex at 0.71.   I saw him in follow-up of the above studies and recommended cardiac catheterization.  Catheterization was done in November 15, 2017 which showed severe multivessel CAD with 85% ostial LAD stenosis, diffuse 75% mid LAD stenosis, total occlusion of the mid AV groove circumflex after the takeoff of the second obtuse marginal vessel with  vessel with 50% diffuse stenosis in the first large marginal branch, 80 and 90% stenoses in the second obtuse marginal vessel and extensive collateralization to the distal circumflex and third marginal via the LAD.  He had 1650% mid RCA stenoses and a dominant RCA.  Of note, he had probable high right radial artery takeoff with spasm/coarse stenosis above the elbow and there was retrograde filling of the brachial artery and the catheterization was transition to the femoral approach.  He underwent successful CABG revascularization surgery the following day by Dr. Roxy Horseman on November 16, 2017 with a LIMA  graft to his LAD, SVG to first marginal and distal circumflex, and SVG to the PDA of his RCA with left thigh and calf endoscopic vein harvest.  He developed atrial fibrillation with RVR on postop day 2 and was started on IV amiodarone.  He ultimately converted back to sinus rhythm but developed recurrent AF on postop day 4 on metoprolol and amiodarone and ultimately converted back to sinus rhythm.  He was started on Eliquis on day 5 for anticoagulation.  He was discharged on November 22, 2017.  Approximately 10 days later he started to develop left lower extremity swelling and redness with pain in the back of his calf.  He was evaluated by Carl Handler, PA-C at [facility] on December 04, 2017.  There was no sign of infection.  Venous duplex imaging did not reveal any DVT.  He saw Carl [name] for initial follow-up evaluation on December 14, 2017 at which time he was maintaining sinus rhythm.  He will be participating in cardiac rehab and his orientation is scheduled for February 15, 2018.  He denied any recurrent chest pain.  His breathing has improved with walking.  He went to the beach and was walking at least a mile per day.  He is sleeping better and snoring is less.    Since I saw him in November 2019, he has continued to do exceptionally well.  He has been participating in cardiac rehab.  He is exercising regularly.  He denies chest pain PND orthopnea.  Subsequent laboratory showed further TSH elevation.  His levothyroxine was increased to 75 mcg.  Amiodarone was discontinued due to LFT elevation and his dose of rosuvastatin was reduced to one half of the current dose.  I last saw him in February 2020 at that time he had not had follow-up laboratory.  Participated in cardiac rehabilitation and also had joined a gym.  He had had follow-up laboratory which continued to show hypothyroidism and his dose of levothyroxine has gradually been increased.  Recent laboratory done by Dr. Rex Kras several months ago continues to  show a TSH was elevated and his levothyroxine dose was increased from 100 to 125 mcg.  During the COVID-19 pandemic, he admits to weight gain.  He has not been exercising as much as he had in the past.  Although he felt well, when recently seen by Dr. Durward Fortes he was felt to potentially be more short of breath.  He was advised to see me back for follow-up evaluation.  I scheduled him to undergo repeat laboratory which was done on October 05, 2018.  Hemoglobin 15.4, hematocrit 44.2.  Lipid studies were significantly improved from 6 months previously with total cholesterol now 143, triglycerides 142, HDL 48, LDL 67.  Brain natruretic peptide was 131.  TSH had significantly improved but was still elevated at 15.9.  He had normal renal function with a BUN of 15 creatinine 0.96.  LFTs  improved but minimally increased with an AST of 46 and ALT at 49.  He denied any chest pain or awareness of palpitations.  I saw him for reevaluation on October 08, 2018.  During that evaluation, I reviewed his laboratory and felt he was euvolemic.  His ECG showed normal sinus rhythm at 60 bpm with an isolated PVC.  His QTc interval was 434 ms.  With his continued TSH elevation, levothyroxine was further titrated to 150 mcg.  Subsequently, Carl [name] has continued to feel well.  He denied any awareness of palpitations.  Last week, in October 29, 2018 when checking his Apple watch he saw report that his heart rate was increased in the upper 130s.  He was unaware of this irregularity.  Upon further questioning, he had to go out of town over the last several days and believes he may have missed taking his morning metoprolol dose on Friday and Saturday and he could not remember if he had taken it yesterday morning.  He was supposed to be taken metoprolol tartrate 25 mg twice a day, HCTZ on a as needed basis which he has not needed.  He continues to be on Eliquis 5 mg twice a day and is tolerated the levothyroxine 150 mcg dose.   He is now on the vascepa which was just renewed last week 2 capsules twice a day, rosuvastatin 20 mg daily for mixed hyperlipidemia.  After receiving his phone call, I advised that he come to the office to check an EKG.  With his ECG showing atrial flutter at a rate of 130 he was added onto my schedule for further evaluation.  During that evaluation, he was asymptomatic and felt well.  His ECG showed atrial flutter with variable block at 130 bpm with nonspecific ST-T changes.  He was on Eliquis for anticoagulation.  I recommended titration of metoprolol tartrate to 50 mg twice a day.  Laboratory drawn that day revealed a TSH 5.95, magnesium 2.3, potassium 4.7, BUN 14 creatinine 0.9.   Over the past week, he has felt well and continues not to be aware of his heart rate irregularity or increased pulse.  He was scheduled for follow-up visit today and on his apple watch his heart rate is still been in the 1 15-1 20 range.  He denies chest pain.  He denies tremors.  He is sleeping well and denies any awareness of sleep apnea.  He presents for evaluation.   Past Medical History:  Diagnosis Date   Anxiety    takes Xanax daily as needed   Arthritis    Cataract    removed both eyes   Depression    takes Celexa daily   Diverticulitis    Diverticulosis    Enlarged prostate    sightly   Esophagitis    Gastric ulcer    GERD (gastroesophageal reflux disease)    takes Protonix daily   Hx of adenomatous colonic polyps    benign   Hyperlipidemia    takes Zocor daily   Hypertension    Insomnia    takes Ambien nightly   Joint pain    Joint swelling     Past Surgical History:  Procedure Laterality Date   CARDIAC CATHETERIZATION     cartliage removed from nose  94yr ago   cataract surgery Bilateral    CERVICAL SPINE SURGERY     COLONOSCOPY     CORONARY ARTERY BYPASS GRAFT N/A 11/16/2017   Procedure: CORONARY ARTERY BYPASS GRAFTING (CABG) x4. LEFT  ENDOSCOPIC SAPHENOUS VEIN  HARVEST AND MAMMARY ARTERY TAKE DOWN. LIMA TO LAD, SVG TO PDA, SVG TO DISTAL CIRC & OMI.;  Surgeon: [name], MD;  Location: [place];  Service: Open Heart Surgery;  Laterality: N/A;   FOREIGN BODY REMOVAL ESOPHAGEAL     KNEE SURGERY Left    couple of times   LEFT HEART CATH AND CORONARY ANGIOGRAPHY N/A 11/15/2017   Procedure: LEFT HEART CATH AND CORONARY ANGIOGRAPHY;  Surgeon: Troy Sine, MD;  Location: [facility];  Service: Cardiovascular;  Laterality: N/A;   NASAL SINUS SURGERY     POLYPECTOMY     TEE WITHOUT CARDIOVERSION N/A 11/16/2017   Procedure: TRANSESOPHAGEAL ECHOCARDIOGRAM (TEE);  Surgeon: [name], MD;  Location: [employer] [facility];  Service: Open Heart Surgery;  Laterality: N/A;   TONSILLECTOMY     TOTAL KNEE ARTHROPLASTY Left 06/17/2014   Procedure: TOTAL KNEE ARTHROPLASTY;  Surgeon: Garald Balding, MD;  Location: [place];  Service: Orthopedics;  Laterality: Left;    Current Medications: Outpatient Medications Prior to Visit  Medication Sig Dispense Refill   acetaminophen (TYLENOL) 325 MG tablet Take 2 tablets (650 mg total) by mouth every 6 (six) hours as needed for mild pain.     acyclovir (ZOVIRAX) 400 MG tablet Take 400 mg by mouth 2 (two) times daily.     apixaban (ELIQUIS) 5 MG TABS tablet Take 1 tablet (5 mg total) by mouth 2 (two) times daily. 180 tablet 1   aspirin EC 81 MG tablet Take 1 tablet (81 mg total) by mouth daily. 90 tablet 3   citalopram (CELEXA) 20 MG tablet Take 20 mg by mouth daily.     Collagen-Boron-Hyaluronic Acid ([employer] JOINT HEALTH TRIPLE ACTION PO) Take 1 tablet by mouth daily.     hydrochlorothiazide (MICROZIDE) 12.5 MG capsule Take 1 capsule (12.5 mg total) by mouth as needed (swelling). 30 capsule 3   Icosapent Ethyl (VASCEPA) 1 g CAPS Take 2 capsules (2 g total) by mouth 2 (two) times daily. 360 capsule 1   levothyroxine (SYNTHROID) 150 MCG tablet Take 1 tablet (150 mcg total) by mouth daily. 90 tablet 1    Magnesium Carbonate POWD Take 4 g by mouth daily. Natural Vitality Calm     Multiple Vitamins-Minerals (PRESERVISION AREDS 2 PO) Take 1 tablet by mouth 2 (two) times daily.     pantoprazole (PROTONIX) 20 MG tablet TAKE 2 TABLETS(40 MG) BY MOUTH DAILY 180 tablet 3   rosuvastatin (CRESTOR) 20 MG tablet Take 1 tablet (20 mg total) by mouth daily. 90 tablet 2   zolpidem (AMBIEN) 10 MG tablet Take 10 mg by mouth at bedtime.      metoprolol tartrate (LOPRESSOR) 50 MG tablet Take 0.5 tablets (25 mg total) by mouth 2 (two) times daily. 60 tablet 6   No facility-administered medications prior to visit.      Allergies:   Patient has no known allergies.   Social History   Socioeconomic History   Marital status: Single    Spouse name: Not on file   Number of children: 3   Years of education: Not on file   Highest education level: Not on file  Occupational History   Occupation: Owner  Social Needs   Financial resource strain: Not very hard   Food insecurity    Worry: Never true    Inability: Never true   Transportation needs    Medical: No    Non-medical: No  Tobacco Use   Smoking  status: Never Smoker   Smokeless tobacco: Never Used  Substance and Sexual Activity   Alcohol use: Yes    Alcohol/week: 10.0 standard drinks    Types: 10 Standard drinks or equivalent per week    Comment: 2 drinks 5 nights per week   Drug use: No   Sexual activity: Never  Lifestyle   Physical activity    Days per week: 4 days    Minutes per session: 30 min   Stress: Only a little  Relationships   Press photographer on phone: Not on file    Gets together: Not on file    Attends religious service: Not on file    Active member of club or organization: Not on file    Attends meetings of clubs or organizations: Not on file    Relationship status: Not on file  Other Topics Concern   Not on file  Social History Narrative   Not on file    Additional social history is  notable in that he is divorced x2.  He has 3 children and his oldest son lives in [place].  His second son currently had lived [place] and was married in [place].  In June 2020 he moved back to the [place] area.  His daughter works in [place] as a Government social research officer in a Web designer.  There is no tobacco use.  He drinks alcohol.  Family History:  The patient's family history includes Colon cancer in his paternal grandfather.  Mother died at age 66 with emphysema.  His father died at 83.  He has 2 sisters ages 57 and 75.  ROS General: Negative; No fevers, chills, or night sweats;  HEENT: Decreased hearing with hearing aid in right ear, no sinus congestion, difficulty swallowing Pulmonary: Negative; No cough, wheezing, shortness of breath, hemoptysis Cardiovascular: Negative; No chest pain, presyncope, syncope, palpitations GI: History of diverticular disease and colonic polyps. GU: Remote history of genital herpes; erectile dysfunction Musculoskeletal: Negative; no myalgias, joint pain, or weakness Hematologic/Oncology: Negative; no easy bruising, bleeding Endocrine: Positive for hypothyroidism Neuro: Negative; no changes in balance, headaches Skin: Negative; No rashes or skin lesions Psychiatric:  Sleep: Negative; No snoring, daytime sleepiness, hypersomnolence, bruxism, restless legs, hypnogognic hallucinations, no cataplexy Other comprehensive 14 point system review is negative.   PHYSICAL EXAM:   VS:  BP (!) 142/80    Pulse (!) 115    Ht '5\' 9"'$  (1.753 m)    Wt 202 lb (91.6 kg)    BMI 29.83 kg/m     Repeat blood pressure by me was 138/80 , Wt Readings from Last 3 Encounters:  11/05/18 202 lb (91.6 kg)  10/29/18 202 lb (91.6 kg)  10/29/18 202 lb (91.6 kg)    General: Alert, oriented, no distress.  Skin: normal turgor, no rashes, warm and dry HEENT: Normocephalic, atraumatic. Pupils equal round and reactive to light; sclera anicteric; extraocular muscles intact;  Nose  without nasal septal hypertrophy Mouth/Parynx benign; Mallinpatti scale Neck: No JVD, no carotid bruits; normal carotid upstroke Lungs: clear to ausculatation and percussion; no wheezing or rales Chest wall: without tenderness to palpitation Heart: PMI not displaced, tachycardic with variable rate around 115 bpm, s1 s2 normal, 1/6 systolic murmur, no diastolic murmur, no rubs, gallops, thrills, or heaves Abdomen: soft, nontender; no hepatosplenomehaly, BS+; abdominal aorta nontender and not dilated by palpation. Back: no CVA tenderness Pulses 2+ Musculoskeletal: full range of motion, normal strength, no joint deformities Extremities: no clubbing cyanosis  or edema, Homan's sign negative  Neurologic: grossly nonfocal; Cranial nerves grossly wnl Psychologic: Normal mood and affect    Studies/Labs Reviewed:   ECG (independently read by me): Atrial flutter with variable block with an average heart rate at 115 bpm  October 29, 2018 ECG (independently read by me): Atrial flutter with variable block 130 bpm.  No specific ST-T changes  October 08, 2018 EKG:  EKG is ordered today.  ECG (independently read by me): NSR at 60; isolated PVC; QTc 434 msec.  April 02, 2018 ECG (independently read by me): NSR 62; Nonspecific T change; QTc 452 msec  December 29, 2017 ECG (independently read by me): Sinus bradycardia at 49 bpm.  Nonspecific ST changes.  QTc interval 464 ms.  No ectopy.  ECG (independently read by me): Sinus rhythm at 64 bpm.  Right bundle branch block with repolarization changes.  No ectopy.  October 24, 2017 ECG (independently read by me): Normal sinus rhythm at 62 bpm.  Right bundle branch block with repolarization changes.   ECG  April 18, 2014 revealed normal sinus rhythm.  Right bundle branch block was not present.  Recent Labs: BMP Latest Ref Rng & Units 10/29/2018 10/05/2018 04/03/2018  Glucose 65 - 99 mg/dL 92 107(H) 95  BUN 8 - 27 mg/dL '14 15 14  '$ Creatinine 0.76 - 1.27  mg/dL 0.93 0.96 1.12  BUN/Creat Ratio 10 - '24 15 16 13  '$ Sodium 134 - 144 mmol/L 137 139 139  Potassium 3.5 - 5.2 mmol/L 4.7 4.6 4.5  Chloride 96 - 106 mmol/L 100 100 100  CO2 20 - 29 mmol/L '22 22 24  '$ Calcium 8.6 - 10.2 mg/dL 9.5 9.5 9.2     Hepatic Function Latest Ref Rng & Units 10/05/2018 04/03/2018 02/01/2018  Total Protein 6.0 - 8.5 g/dL 6.7 6.8 6.2  Albumin 3.7 - 4.7 g/dL 4.6 4.3 3.8  AST 0 - 40 IU/L 46(H) 32 62(H)  ALT 0 - 44 IU/L 49(H) 32 75(H)  Alk Phosphatase 39 - 117 IU/L 91 183(H) 249(H)  Total Bilirubin 0.0 - 1.2 mg/dL 0.4 0.4 0.3  Bilirubin, Direct 0.00 - 0.40 mg/dL - - 0.15    CBC Latest Ref Rng & Units 10/05/2018 04/03/2018 01/02/2018  WBC 3.4 - 10.8 x10E3/uL 6.6 6.0 6.2  Hemoglobin 13.0 - 17.7 g/dL 15.4 14.9 13.3  Hematocrit 37.5 - 51.0 % 44.2 44.9 40.1  Platelets 150 - 450 x10E3/uL 238 243 250   Lab Results  Component Value Date   MCV 96 10/05/2018   MCV 91 04/03/2018   MCV 98 (H) 01/02/2018   Lab Results  Component Value Date   TSH 5.950 (H) 10/29/2018   Lab Results  Component Value Date   HGBA1C 5.4 11/15/2017     BNP    Component Value Date/Time   BNP 131.4 (H) 10/05/2018 1010    ProBNP No results found for: PROBNP   Lipid Panel     Component Value Date/Time   CHOL 143 10/05/2018 1010   TRIG 142 10/05/2018 1010   HDL 48 10/05/2018 1010   CHOLHDL 3.0 10/05/2018 1010   CHOLHDL 3.4 01/29/2007 0410   VLDL 17 01/29/2007 0410   LDLCALC 67 10/05/2018 1010     RADIOLOGY: No results found.   Additional studies/ records that were reviewed today include:  I reviewed his previous evaluation with me from April 18, 2014.   ASSESSMENT:    1. Atrial flutter with rapid ventricular response ([place])   2. CAD in native artery  3. S/P CABG x 4   4. Anticoagulated   5. Hypothyroidism, unspecified type   6. Essential hypertension   7. 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  He has a history of mild hyperlipidemia and remotely had been on simvastatin 40 mg daily and has been followed by Dr. Hulan Fess.  In the fall 2019 he began to notice mild shortness of breath particularly with walking up steps.  When I saw him for initial evaluation an echo Doppler study revealed hyperdynamic LV function with an EF of 65 to 70% but with grade 2 diastolic dysfunction and abnormal tissue Doppler.  His CT coronary angiogram was suggestive of significant multivessel CAD and was FFR positive.  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 Roxy Horseman.  His postoperative course was complicated by paroxysmal atrial fibrillation for which he was started on amiodarone and ultimately was discharged on Eliquis.  When I saw him for initial post hospital evaluation he was still on amiodarone 200 mg twice a day, metoprolol tartrate 25 mg twice a day.  His ECG  showed sinus bradycardia 5 weeks status post CABG revascularization; amiodarone was decreased to 200 mg daily and subsequently discontinued when subsequent laboratory showed LFT elevation and further increase in his TSH level.  Levothyroxine dose was increased several occasions earlier this summer was increased to 125 mcg following an increased level by his primary physician.  TSH from October 05, 2018 showed improvement but TSH was still increased at 15.9 and I   further titrated levothyroxine to 150 mcg.  He was maintaining sinus rhythm and denied any awareness of recurrent palpitations or arrhythmia.  He had inadvertently missed at least 2 morning doses of his beta-blocker therapy on September 18 and 19 with possible missing the morning dose of September 20 and was noted by his Apple Watch to be tachycardic with a pulse of 130.  ECG obtained in the office when seen by me as an add-on confirmed atrial  flutter with variable block.  For the past week he has been on increased metoprolol at 50 mg twice a day.  ECG today confirms that he is still in atrial flutter with variable block at a rate now at 115 bpm.  He continues to be on Eliquis for anticoagulation.  Recent laboratory were reviewed and were normal.  TSH had improved to 5.95 on his levothyroxine at 150 mcg dose.  I discussed DC cardioversion which should be very successful restoration of sinus rhythm versus further attempt at increasing beta-blocker therapy.  I do not believe he is a candidate for amiodarone reinstitution since this was associated with increased liver function studies as well as progressive hypothyroidism.  His daughter is coming to town this weekend from [place] and in light of pre-procedure COVID testing and need for quarantine for 3 days prior to his procedure he would prefer at this time to try the increase medical approach.  As result he will increase metoprolol to 75 mg twice a day.  After several days he will contact me by phone for an update.  His heart rate is still increased further titration may be necessary.  I will recommend to see him in the office next week and if at that time he is still in atrial flutter plans  will be set up for outpatient DC cardioversion with preprocedural COVID testing.   Medication Adjustments/Labs and Tests Ordered: Current medicines are reviewed at length with the patient today.  Concerns regarding medicines are outlined above.  Medication changes, Labs and Tests ordered today are listed in the Patient Instructions below. Patient Instructions  Medication Instructions:   START METOPROLOL TARTRATE 75 MG (TOTAL 50 MG TABLET AND  TABLETS OF 25 MG )  TAKE TWICE A DAY   If you need a refill on your cardiac medications before your next appointment, please call your pharmacy.   Lab work:   NOT NEEDED  Testing/Procedures: NOT NEEDED  Follow-Up: At [facility], you and your health needs  are our priority.  As part of our continuing mission to provide you with exceptional heart care, we have created designated Provider Care Teams.  These Care Teams include your primary Cardiologist (physician) and Advanced Practice Providers (APPs -  Physician Assistants and Nurse Practitioners) who all work together to provide you with the care you need, when you need it.  Your physician recommends that you schedule a follow-up appointment in: OCT 8,2020 AT 1:20 PM    Any Other Special Instructions Will Be Listed Below (If Applicable).      Signed, Shelva Majestic, MD  11/06/2018 7:18 PM    [facility] [address] [place], [place],   [address] Phone: [phone]

## 2018-11-05 NOTE — Patient Instructions (Signed)
Medication Instructions:   START METOPROLOL TARTRATE 75 MG (TOTAL 50 MG TABLET AND  TABLETS OF 25 MG )  TAKE TWICE A DAY   If you need a refill on your cardiac medications before your next appointment, please call your pharmacy.   Lab work:   NOT NEEDED  Testing/Procedures: NOT NEEDED  Follow-Up: At [facility], you and your health needs are our priority.  As part of our continuing mission to provide you with exceptional heart care, we have created designated Provider Care Teams.  These Care Teams include your primary Cardiologist (physician) and Advanced Practice Providers (APPs -  Physician Assistants and Nurse Practitioners) who all work together to provide you with the care you need, when you need it. . Your physician recommends that you schedule a follow-up appointment in: OCT 8,2020 AT 1:20 PM .   Any Other Special Instructions Will Be Listed Below (If Applicable).

## 2018-11-06 ENCOUNTER — Encounter: Payer: Self-pay | Admitting: Cardiovascular Disease

## 2018-11-13 DIAGNOSIS — N200 Calculus of kidney: Secondary | ICD-10-CM | POA: Diagnosis not present

## 2018-11-13 DIAGNOSIS — R3121 Asymptomatic microscopic hematuria: Secondary | ICD-10-CM | POA: Diagnosis not present

## 2018-11-15 ENCOUNTER — Ambulatory Visit (INDEPENDENT_AMBULATORY_CARE_PROVIDER_SITE_OTHER): Payer: PPO | Admitting: Cardiovascular Disease

## 2018-11-15 ENCOUNTER — Other Ambulatory Visit: Payer: Self-pay

## 2018-11-15 VITALS — BP 133/88 | HR 97 | Ht 69.0 in | Wt 208.0 lb

## 2018-11-15 DIAGNOSIS — E785 Hyperlipidemia, unspecified: Secondary | ICD-10-CM | POA: Diagnosis not present

## 2018-11-15 DIAGNOSIS — Z79899 Other long term (current) drug therapy: Secondary | ICD-10-CM | POA: Diagnosis not present

## 2018-11-15 DIAGNOSIS — I10 Essential (primary) hypertension: Secondary | ICD-10-CM

## 2018-11-15 DIAGNOSIS — I483 Typical atrial flutter: Secondary | ICD-10-CM | POA: Diagnosis not present

## 2018-11-15 DIAGNOSIS — Z7901 Long term (current) use of anticoagulants: Secondary | ICD-10-CM

## 2018-11-15 DIAGNOSIS — E039 Hypothyroidism, unspecified: Secondary | ICD-10-CM

## 2018-11-15 DIAGNOSIS — Z951 Presence of aortocoronary bypass graft: Secondary | ICD-10-CM

## 2018-11-15 NOTE — Progress Notes (Signed)
° °Cardiology Office Note   ° °Date:  11/16/2018  ° °ID:  Staton D Kwolek, DOB 05/10/1945, MRN [id number] ° °PCP:  Little, Kevin, MD  °Cardiologist:  Emad Brechtel, MD  ° ° °History of Present Illness:  °Carl [name] is a 73 y.o. male who presents to the office today for a one-week follow-up evaluation for being found to be in atrial fibrillation when seen as an add-on last week.  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  In 2009 he developed transient numbness of his right face which lasted for several minutes and at that time apparently underwent evaluation and had a normal MRI, MRA, and carotid duplex studies.  He was found to have only mild to moderate plaque in the proximal right internal carotid artery without stenosis. ° °I had seen him in March 2016 for preoperative cardiology clearance prior to undergoing left knee replacement surgery by Dr. Whitfield.  Preoperative ECG suggested possible junctional rhythm which was new from an ECG of 2012 which previously had only shown sinus bradycardia.  He was asymptomatic.  When I saw him, his ECG showed sinus rhythm at 61 bpm with normal intervals and on that ECG P waves were normal and upright inferiorly. ° °Suhan has remained fairly active. In 2019  he had not been exercising as much as he had in the past still working out with a trainer and denied any associated chest pain or palpitations.  He in September 201 19 he began to notice more shortness of breath with walking up steps.  He was  at a friend's house and was told that he appeared to be more short of breath than previously.  I saw him for evaluation of his exertional shortness of breath on October 24, 2017.  At that time, I recommended that he undergo a 2D echo Doppler study and scheduled him for coronary CT angiography.  His echo Doppler study demonstrated hyperdynamic LV function with an EF of 65 to 70%.  Doppler parameters suggest grade 2  diastolic dysfunction and elevated ventricular end-diastolic filling pressure.  He had mild MR, mild TR, and mild dilation of his left atrium.  Pulmonary pressures were normal.  CT coronary angiography was performed on November 09, 2017.  This was abnormal and demonstrated an elevated calcium score a 25 which is 78th percentile for age and sex.  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  There was greater than 75% calcific plaque in his proximal and mid diagonal 1 vessel.  The circumflex had 50% proximal plaque.  There was 50 to 75% mixed plaque in the OM 2 vessel less than 50% in the OM1 vessel.  His RCA had 50% calcified plaque proximally, 50 to 75% calcified plaque in the mid vessel.  There was mild aortic root dilatation at 4.1 cm.  His CT images were referred for FFR analysis which were positive in the mid RCA 0.78, the mid LAD 0.78 and in the AV groove circumflex at 0.71.  ° °I saw him in follow-up of the above studies and recommended cardiac catheterization.  Catheterization was done in November 15, 2017 which showed severe multivessel CAD with 85% ostial LAD stenosis, diffuse 75% mid LAD stenosis, total occlusion of the mid AV groove circumflex after the takeoff of the second obtuse marginal vessel with   50% diffuse stenosis in the first large marginal branch, 80 and 90% stenoses in the second obtuse marginal vessel and extensive collateralization to the distal circumflex and third marginal via the LAD.  He had 1650% mid RCA stenoses and a dominant RCA.  Of note, he had probable high right radial artery takeoff with spasm/coarse stenosis above the elbow and there was retrograde filling of the brachial artery and the catheterization was transition to the femoral approach. ° °He underwent successful CABG revascularization surgery the following day by Dr. Gerhart on November 16, 2017 with a LIMA  graft to his LAD, SVG to first marginal and distal circumflex, and SVG to the PDA of his RCA with left thigh and calf endoscopic vein harvest.  He developed atrial fibrillation with RVR on postop day 2 and was started on IV amiodarone.  He ultimately converted back to sinus rhythm but developed recurrent AF on postop day 4 on metoprolol and amiodarone and ultimately converted back to sinus rhythm.  He was started on Eliquis on day 5 for anticoagulation.  He was discharged on November 22, 2017.  Approximately 10 days later he started to develop left lower extremity swelling and redness with pain in the back of his calf.  He was evaluated by Erin Barrett, PA-C at [facility] on December 04, 2017.  There was no sign of infection.  Venous duplex imaging did not reveal any DVT.  He saw Dr. Gerhardt for initial follow-up evaluation on December 14, 2017 at which time he was maintaining sinus rhythm.  He will be participating in cardiac rehab and his orientation is scheduled for February 15, 2018.  He denied any recurrent chest pain.  His breathing has improved with walking.  He went to the beach and was walking at least a mile per day.  He is sleeping better and snoring is less.   ° °Since I saw him in November 2019, he has continued to do exceptionally well.  He has been participating in cardiac rehab.  He is exercising regularly.  He denies chest pain PND orthopnea.  Subsequent laboratory showed further TSH elevation.  His levothyroxine was increased to 75 mcg.  Amiodarone was discontinued due to LFT elevation and his dose of rosuvastatin was reduced to one half of the current dose.  I last saw him in February 2020 at that time he had not had follow-up laboratory.  Participated in cardiac rehabilitation and also had joined a gym. ° °He had had follow-up laboratory which continued to show hypothyroidism and his dose of levothyroxine has gradually been increased.  Recent laboratory done by Dr. Little several months ago continues to  show a TSH was elevated and his levothyroxine dose was increased from 100 to 125 mcg.  During the COVID-19 pandemic, he admits to weight gain.  He has not been exercising as much as he had in the past.  Although he felt well, when recently seen by Dr. Whitfield he was felt to potentially be more short of breath.  He was advised to see me back for follow-up evaluation.  I scheduled him to undergo repeat laboratory which was done on October 05, 2018.  Hemoglobin 15.4, hematocrit 44.2.  Lipid studies were significantly improved from 6 months previously with total cholesterol now 143, triglycerides 142, HDL 48, LDL 67.  Brain natruretic peptide was 131.  TSH had significantly improved but was still elevated at 15.9.  He had normal renal function with a BUN of 15 creatinine 0.96.  LFTs were significantly   were significantly improved but minimally increased with an AST of 46 and ALT at 49.  He denied any chest pain or awareness of palpitations.  I saw him for reevaluation on October 08, 2018.  During that evaluation, I reviewed his laboratory and felt he was euvolemic.  His ECG showed normal sinus rhythm at 60 bpm with an isolated PVC.  His QTc interval was 434 ms.  With his continued TSH elevation, levothyroxine was further titrated to 150 mcg.  Subsequently, Klaus has continued to feel well.  He denied any awareness of palpitations.  Last week, in October 29, 2018 when checking his Apple watch he saw report that his heart rate was increased in the upper 130s.  He was unaware of this irregularity.  Upon further questioning, he had to go out of town over the last several days and believes he may have missed taking his morning metoprolol dose on Friday and Saturday and he could not remember if he had taken it yesterday morning.  He was supposed to be taken metoprolol tartrate 25 mg twice a day, HCTZ on a as needed basis which he has not needed.  He continues to be on Eliquis 5 mg twice a day and is tolerated the levothyroxine 150 mcg dose.   He is now on the vascepa which was just renewed last week 2 capsules twice a day, rosuvastatin 20 mg daily for mixed hyperlipidemia.  After receiving his phone call, I advised that he come to the office to check an EKG.  With his ECG showing atrial flutter at a rate of 130 he was added onto my schedule for further evaluation.  During that evaluation, he was asymptomatic and felt well.  His ECG showed atrial flutter with variable block at 130 bpm with nonspecific ST-T changes.  He was on Eliquis for anticoagulation.  I recommended titration of metoprolol tartrate to 50 mg twice a day.  Laboratory drawn that day revealed a TSH 5.95, magnesium 2.3, potassium 4.7, BUN 14 creatinine 0.9.   I saw him on September 28 for follow-up evaluation.  He was feeling well and continues to be unaware of his heart rate irregularity.  However according to his Apple Watch, his heart rate was running in the 115-120 range   He denied chest pain.  He denied tremors.  He was sleeping well and denied any awareness of sleep apnea.  During that evaluation we discussed gradually him for DC cardioversion but this would require COVID-19 testing with quarantine for 3 days prior to having the procedure done.  His daughter was coming into town that weekend.  As result we further titrated his metoprolol to 75 mg twice a day which he did for 4 days and then increase to 200 mg twice a day.  He continues to be on Eliquis for anticoagulation.  He admits to increased fatigue but denies chest pain.  He presents today for one-week follow-up evaluation.   Past Medical History:  Diagnosis Date   Anxiety    takes Xanax daily as needed   Arthritis    Cataract    removed both eyes   Depression    takes Celexa daily   Diverticulitis    Diverticulosis    Enlarged prostate    sightly   Esophagitis    Gastric ulcer    GERD (gastroesophageal reflux disease)    takes Protonix daily   Hx of adenomatous colonic polyps    benign    Hyperlipidemia    takes Zocor  Hypertension   °• Insomnia   ° takes Ambien nightly  °• Joint pain   °• Joint swelling   ° ° °Past Surgical History:  °Procedure Laterality Date  °• CARDIAC CATHETERIZATION    °• cartliage removed from nose  50yrs ago  °• cataract surgery Bilateral   °• CERVICAL SPINE SURGERY    °• COLONOSCOPY    °• CORONARY ARTERY BYPASS GRAFT N/A 11/16/2017  ° Procedure: CORONARY ARTERY BYPASS GRAFTING (CABG) x4. LEFT ENDOSCOPIC SAPHENOUS VEIN HARVEST AND MAMMARY ARTERY TAKE DOWN. LIMA TO LAD, SVG TO PDA, SVG TO DISTAL CIRC & OMI.;  Surgeon: Gerhardt, Edward B, MD;  Location: [facility] OR;  Service: Open Heart Surgery;  Laterality: N/A;  °• FOREIGN BODY REMOVAL ESOPHAGEAL    °• KNEE SURGERY Left   ° couple of times  °• LEFT HEART CATH AND CORONARY ANGIOGRAPHY N/A 11/15/2017  ° Procedure: LEFT HEART CATH AND CORONARY ANGIOGRAPHY;  Surgeon: Jaishawn Witzke A, MD;  Location: [facility] INVASIVE CV [facility];  Service: Cardiovascular;  Laterality: N/A;  °• NASAL SINUS SURGERY    °• POLYPECTOMY    °• TEE WITHOUT CARDIOVERSION N/A 11/16/2017  ° Procedure: TRANSESOPHAGEAL ECHOCARDIOGRAM (TEE);  Surgeon: Gerhardt, Edward B, MD;  Location: [facility] OR;  Service: Open Heart Surgery;  Laterality: N/A;  °• TONSILLECTOMY    °• TOTAL KNEE ARTHROPLASTY Left 06/17/2014  ° Procedure: TOTAL KNEE ARTHROPLASTY;  Surgeon: Peter W Whitfield, MD;  Location: [facility] OR;  Service: Orthopedics;  Laterality: Left;  ° ° °Current Medications: °Outpatient Medications Prior to Visit  °Medication Sig Dispense Refill  °• acetaminophen (TYLENOL) 325 MG tablet Take 2 tablets (650 mg total) by mouth every 6 (six) hours as needed for mild pain.    °• acyclovir (ZOVIRAX) 400 MG tablet Take 400 mg by mouth 2 (two) times daily.    °• apixaban (ELIQUIS) 5 MG TABS tablet Take 1 tablet (5 mg total) by mouth 2 (two) times daily. 180 tablet 1  °• aspirin EC 81 MG tablet Take 1 tablet (81 mg total) by mouth daily. (Patient taking differently: Take 81 mg by mouth at  bedtime. ) 90 tablet 3  °• citalopram (CELEXA) 20 MG tablet Take 20 mg by mouth daily.    °• Collagen-Boron-Hyaluronic Acid ([employer] JOINT HEALTH TRIPLE ACTION PO) Take 1 tablet by mouth 2 (two) times daily.     °• Icosapent Ethyl (VASCEPA) 1 g CAPS Take 2 capsules (2 g total) by mouth 2 (two) times daily. 360 capsule 1  °• levothyroxine (SYNTHROID) 150 MCG tablet Take 1 tablet (150 mcg total) by mouth daily. (Patient taking differently: Take 150 mcg by mouth at bedtime. ) 90 tablet 1  °• Magnesium Carbonate POWD Take 4 g by mouth daily. Natural Vitality Calm    °• metoprolol tartrate (LOPRESSOR) 25 MG tablet Take 1 tablet (25 mg total) by mouth 2 (two) times daily. take with 50 mg to make 75 mg total (Patient not taking: Reported on 11/15/2018) 180 tablet 3  °• metoprolol tartrate (LOPRESSOR) 50 MG tablet Take 1 tablet (50 mg total) by mouth 2 (two) times daily. Take with an additional dose of  25 mg tablet to total 75 mg twice a day (Patient taking differently: Take 100 mg by mouth 2 (two) times daily. ) 60 tablet 4  °• Multiple Vitamins-Minerals (PRESERVISION AREDS 2 PO) Take 1 tablet by mouth 2 (two) times daily.    °• pantoprazole (PROTONIX) 20 MG tablet TAKE 2 TABLETS(40 MG) BY MOUTH DAILY (Patient taking differently:   Take 40 mg by mouth at bedtime. ) 180 tablet 3  °• rosuvastatin (CRESTOR) 20 MG tablet Take 1 tablet (20 mg total) by mouth daily. 90 tablet 2  °• zolpidem (AMBIEN) 10 MG tablet Take 10 mg by mouth at bedtime.     °• hydrochlorothiazide (MICROZIDE) 12.5 MG capsule Take 1 capsule (12.5 mg total) by mouth as needed (swelling). (Patient not taking: Reported on 11/15/2018) 30 capsule 3  ° °No facility-administered medications prior to visit.   °  ° °Allergies:   Patient has no known allergies.  ° °Social History  ° °Socioeconomic History  °• Marital status: Single  °  Spouse name: Not on file  °• Number of children: 3  °• Years of education: Not on file  °• Highest education level: Not on file    °Occupational History  °• Occupation: Owner  °Social Needs  °• Financial resource strain: Not very hard  °• Food insecurity  °  Worry: Never true  °  Inability: Never true  °• Transportation needs  °  Medical: No  °  Non-medical: No  °Tobacco Use  °• Smoking status: Never Smoker  °• Smokeless tobacco: Never Used  °Substance and Sexual Activity  °• Alcohol use: Yes  °  Alcohol/week: 10.0 standard drinks  °  Types: 10 Standard drinks or equivalent per week  °  Comment: 2 drinks 5 nights per week  °• Drug use: No  °• Sexual activity: Never  °Lifestyle  °• Physical activity  °  Days per week: 4 days  °  Minutes per session: 30 min  °• Stress: Only a little  °Relationships  °• Social connections  °  Talks on phone: Not on file  °  Gets together: Not on file  °  Attends religious service: Not on file  °  Active member of club or organization: Not on file  °  Attends meetings of clubs or organizations: Not on file  °  Relationship status: Not on file  °Other Topics Concern  °• Not on file  °Social History Narrative  °• Not on file  °  °Additional social history is notable in that he is divorced x2.  He has 3 children and his oldest son lives in [place].  His second son currently had lived [place] and was married in [place].  In June 2020 he moved back to the [place] area.  His daughter works in [place] as a project manager in a construction firm.  There is no tobacco use.  He drinks alcohol. ° °Family History:  The patient's family history includes Colon cancer in his paternal grandfather.  Mother died at age 77 with emphysema.  His father died at 88.  He has 2 sisters ages 70 and 67. ° °ROS °General: Negative; No fevers, chills, or night sweats; positive for fatigue °HEENT: Decreased hearing with hearing aid in right ear, no sinus congestion, difficulty swallowing °Pulmonary: Negative; No cough, wheezing, shortness of breath, hemoptysis °Cardiovascular: Negative; No chest pain, presyncope, syncope,  palpitations °GI: History of diverticular disease and colonic polyps. °GU: Remote history of genital herpes; erectile dysfunction °Musculoskeletal: Negative; no myalgias, joint pain, or weakness °Hematologic/Oncology: Negative; no easy bruising, bleeding °Endocrine: Positive for hypothyroidism °Neuro: Negative; no changes in balance, headaches °Skin: Negative; No rashes or skin lesions °Psychiatric:  °Sleep: Negative; No snoring, daytime sleepiness, hypersomnolence, bruxism, restless legs, hypnogognic hallucinations, no cataplexy °Other comprehensive 14 point system review is negative. ° ° °PHYSICAL EXAM:   °VS:    BP 133/88    Pulse 97    Ht 5' 9" (1.753 m)    Wt 208 lb (94.3 kg)    SpO2 96%    BMI 30.72 kg/m²    ° °Repeat blood pressure by me 128/86 °, °Wt Readings from Last 3 Encounters:  °11/15/18 208 lb (94.3 kg)  °11/05/18 202 lb (91.6 kg)  °10/29/18 202 lb (91.6 kg)  °  °General: Alert, oriented, no distress.  °Skin: normal turgor, no rashes, warm and dry °HEENT: Normocephalic, atraumatic. Pupils equal round and reactive to light; sclera anicteric; extraocular muscles intact;  °Nose without nasal septal hypertrophy °Mouth/Parynx benign; Mallinpatti scale 3 °Neck: No JVD, no carotid bruits; normal carotid upstroke °Lungs: clear to ausculatation and percussion; no wheezing or rales °Chest wall: without tenderness to palpitation °Heart: PMI not displaced, irregular rhythm with rate in the 90s,  s1 s2 normal, 1/6 systolic murmur, no diastolic murmur, no rubs, gallops, thrills, or heaves °Abdomen: soft, nontender; no hepatosplenomehaly, BS+; abdominal aorta nontender and not dilated by palpation. °Back: no CVA tenderness °Pulses 2+ °Musculoskeletal: full range of motion, normal strength, no joint deformities °Extremities: no clubbing cyanosis or edema, Homan's sign negative  °Neurologic: grossly nonfocal; Cranial nerves grossly wnl °Psychologic: Normal mood and affect ° ° °Studies/Labs Reviewed:  ° °ECG  (independently read by me): Atrial Flutter with variable block at 97 bpm ° °September 2020 ECG (independently read by me): Atrial flutter with variable block with an average heart rate at 115 bpm ° °October 29, 2018 ECG (independently read by me): Atrial flutter with variable block 130 bpm.  No specific ST-T changes ° °October 08, 2018 EKG:  EKG is ordered today.  ECG (independently read by me): NSR at 60; isolated PVC; QTc 434 msec. ° °April 02, 2018 ECG (independently read by me): NSR 62; Nonspecific T change; QTc 452 msec ° °December 29, 2017 ECG (independently read by me): Sinus bradycardia at 49 bpm.  Nonspecific ST changes.  QTc interval 464 ms.  No ectopy. ° °ECG (independently read by me): Sinus rhythm at 64 bpm.  Right bundle branch block with repolarization changes.  No ectopy. ° °October 24, 2017 ECG (independently read by me): Normal sinus rhythm at 62 bpm.  Right bundle branch block with repolarization changes. ° ° ECG  April 18, 2014 revealed normal sinus rhythm.  Right bundle branch block was not present. ° °Recent Labs: °BMP Latest Ref Rng & Units 10/29/2018 10/05/2018 04/03/2018  °Glucose 65 - 99 mg/dL 92 107(H) 95  °BUN 8 - 27 mg/dL 14 15 14  °Creatinine 0.76 - 1.27 mg/dL 0.93 0.96 1.12  °BUN/Creat Ratio 10 - 24 15 16 13  °Sodium 134 - 144 mmol/L 137 139 139  °Potassium 3.5 - 5.2 mmol/L 4.7 4.6 4.5  °Chloride 96 - 106 mmol/L 100 100 100  °CO2 20 - 29 mmol/L 22 22 24  °Calcium 8.6 - 10.2 mg/dL 9.5 9.5 9.2  ° ° ° °Hepatic Function Latest Ref Rng & Units 10/05/2018 04/03/2018 02/01/2018  °Total Protein 6.0 - 8.5 g/dL 6.7 6.8 6.2  °Albumin 3.7 - 4.7 g/dL 4.6 4.3 3.8  °AST 0 - 40 IU/L 46(H) 32 62(H)  °ALT 0 - 44 IU/L 49(H) 32 75(H)  °Alk Phosphatase 39 - 117 IU/L 91 183(H) 249(H)  °Total Bilirubin 0.0 - 1.2 mg/dL 0.4 0.4 0.3  °Bilirubin, Direct 0.00 - 0.40 mg/dL - - 0.15  ° ° °CBC Latest Ref Rng & Units 10/05/2018 04/03/2018 01/02/2018  °WBC 3.4 - 10.8   10.8 x10E3/uL 6.6 6.0 6.2  Hemoglobin 13.0 - 17.7 g/dL  15.4 14.9 13.3  Hematocrit 37.5 - 51.0 % 44.2 44.9 40.1  Platelets 150 - 450 x10E3/uL 238 243 250   Lab Results  Component Value Date   MCV 96 10/05/2018   MCV 91 04/03/2018   MCV 98 (H) 01/02/2018   Lab Results  Component Value Date   TSH 5.950 (H) 10/29/2018   Lab Results  Component Value Date   HGBA1C 5.4 11/15/2017     BNP    Component Value Date/Time   BNP 131.4 (H) 10/05/2018 1010    ProBNP No results found for: PROBNP   Lipid Panel     Component Value Date/Time   CHOL 143 10/05/2018 1010   TRIG 142 10/05/2018 1010   HDL 48 10/05/2018 1010   CHOLHDL 3.0 10/05/2018 1010   CHOLHDL 3.4 01/29/2007 0410   VLDL 17 01/29/2007 0410   LDLCALC 67 10/05/2018 1010     RADIOLOGY: No results found.   Additional studies/ records that were reviewed today include:  I reviewed his previous evaluation with me from April 18, 2014.   ASSESSMENT:    1. Typical atrial flutter with variable block ([place])   2. S/P CABG x 4   3. Hypothyroidism, unspecified type   4. Medication management   5. Anticoagulated   6. Essential hypertension   7. Hyperlipidemia LDL goal <70    PLAN:  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  He has a history of mild hyperlipidemia and remotely had been on simvastatin 40 mg daily and has been followed by Dr. Hulan Fess.  In the fall 2019 he began to notice mild shortness of breath particularly with walking up steps.  When I saw him for initial evaluation an echo Doppler study revealed hyperdynamic LV function with an EF of 65 to 70% but with grade 2 diastolic dysfunction and abnormal tissue Doppler.  His CT coronary angiogram was suggestive of significant multivessel CAD and was FFR positive.  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 Roxy Horseman.  His postoperative  course was complicated by paroxysmal atrial fibrillation for which he was started on amiodarone and ultimately was discharged on Eliquis.  When I saw him for initial post hospital evaluation he was still on amiodarone 200 mg twice a day, metoprolol tartrate 25 mg twice a day.  His ECG  showed sinus bradycardia 5 weeks status post CABG revascularization; amiodarone was decreased to 200 mg daily and subsequently discontinued when subsequent laboratory showed LFT elevation and further increase in his TSH level.  Levothyroxine dose was increased several occasions earlier this summer was increased to 125 mcg following an increased level by his primary physician.  TSH from October 05, 2018 showed improvement but TSH was still increased at 15.9 and I   further titrated levothyroxine to 150 mcg.  He was maintaining sinus rhythm and denied any awareness of recurrent palpitations or arrhythmia.  He had inadvertently missed at least 2 morning doses of his beta-blocker therapy on September 18 and 19 with possible missing the morning dose of September 20 and was noted by his Apple Watch to be tachycardic with a pulse of 130.  ECG obtained in the office when seen by me as an add-on confirmed atrial flutter with variable block.  During that evaluation  metoprolol was increased to 50 mg twice a day.  At follow-up on November 05, 2018 he was still in atrial flutter with variable block and metoprolol was further titrated to 75 mg twice a day past 2 days he has been taking 100 mg twice a day.  He now wishes to pursue attempt at DC cardioversion.  He continues to be on Eliquis.  His ECG today confirms atrial flutter with variable block in the 90s.  November 16, 2018 we will recheck a chemistry profile as well as TSH level.  He will undergo a COVID test and we will schedule him to undergo elective DC cardioversion on Tuesday, October 13 at 2 PM.  The risk/benefits of the cardioversion were discussed.  He will continue current therapy as  prescribed.   Medication Adjustments/Labs and Tests Ordered: Current medicines are reviewed at length with the patient today.  Concerns regarding medicines are outlined above.  Medication changes, Labs and Tests ordered today are listed in the Patient Instructions below. Patient Instructions  Dear Denice Bors  You are scheduled for a Cardioversion on November 20, 2018 @ 2PM with [name].  Please arrive at the [facility] (Main Entrance A) at [facility]: [place], [place] [id number] at [facility] am/pm. (1 hour prior to procedure unless lab work is needed; if lab work is needed arrive 1.5 hours ahead)  DIET: Nothing to eat or drink after midnight except a sip of water with medications   Medication Instructions: Continue your anticoagulant: ELIQUIS You will need to continue your anticoagulant after your procedure until you  are told by your  Provider that it is safe to stop   Labs: If patient is on Coumadin, patient needs pt/INR, CBC, BMET within 3 days (No pt/INR needed for patients taking Xarelto, Eliquis, Pradaxa) For patients receiving anesthesia for TEE and all Cardioversion patients: BMET, CBC within 1 week  Come to: [facility] AFTER LABWORK IS DONE.  You must have a responsible person to drive you home and stay in the waiting area during your procedure. Failure to do so could result in cancellation.  Bring your insurance cards.  *Special Note: Every effort is made to have your procedure done on time. Occasionally there are emergencies that occur at the hospital that may cause delays. Please be patient if a delay does occur.       Signed, Shelva Majestic, MD  11/16/2018 6:24 PM    [facility] [address] [place], [place], [place]  [address] Phone: [phone]

## 2018-11-15 NOTE — H&P (View-Only) (Signed)
Cardiology Office Note    Date:  11/16/2018   ID:  Carl [name], DOB 1945-06-30, MRN [id number]  PCP:  Hulan Fess, MD  Cardiologist:  Shelva Majestic, MD    History of Present Illness:  Carl [name] is a 73 y.o. male who presents to the office today for a one-week follow-up evaluation for being found to be in atrial fibrillation when seen as an add-on last week.    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  In 2009 he developed transient numbness of his right face which lasted for several minutes and at that time apparently underwent evaluation and had a normal MRI, MRA, and carotid duplex studies.  He was found to have only mild to moderate plaque in the proximal right internal carotid artery without stenosis.  I had seen him in March 2016 for preoperative cardiology clearance prior to undergoing left knee replacement surgery by Dr. Durward Fortes.  Preoperative ECG suggested possible junctional rhythm which was new from an ECG of 2012 which previously had only shown sinus bradycardia.  He was asymptomatic.  When I saw him, his ECG showed sinus rhythm at 61 bpm with normal intervals and on that ECG P waves were normal and upright inferiorly.  Carl [name] has remained fairly active. In 2019  he had not been exercising as much as he had in the past still working out with a trainer and denied any associated chest pain or palpitations.  He in September 201 19 he began to notice more shortness of breath with walking up steps.  He was  at a friend's house and was told that he appeared to be more short of breath than previously.  I saw him for evaluation of his exertional shortness of breath on October 24, 2017.  At that time, I recommended that he undergo a 2D echo Doppler study and scheduled him for coronary CT angiography.  His echo Doppler study demonstrated hyperdynamic LV function with an EF of 65 to 70%.  Doppler parameters suggest grade 2  diastolic dysfunction and elevated ventricular end-diastolic filling pressure.  He had mild MR, mild TR, and mild dilation of his left atrium.  Pulmonary pressures were normal.  CT coronary angiography was performed on November 09, 2017.  This was abnormal and demonstrated an elevated calcium score a 25 which is 78th percentile for age and sex.  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  There was greater than 75% calcific plaque in his proximal and mid diagonal 1 vessel.  The circumflex had 50% proximal plaque.  There was 50 to 75% mixed plaque in the OM 2 vessel less than 50% in the OM1 vessel.  His RCA had 50% calcified plaque proximally, 50 to 75% calcified plaque in the mid vessel.  There was mild aortic root dilatation at 4.1 cm.  His CT images were referred for Carl [name] analysis which were positive in the mid RCA 0.78, the mid LAD 0.78 and in the AV groove circumflex at 0.71.   I saw him in follow-up of the above studies and recommended cardiac catheterization.  Catheterization was done in November 15, 2017 which showed severe multivessel CAD with 85% ostial LAD stenosis, diffuse 75% mid LAD stenosis, total occlusion of the mid AV groove circumflex after the takeoff of the second obtuse marginal  vessel with 50% diffuse stenosis in the first large marginal branch, 80 and 90% stenoses in the second obtuse marginal vessel and extensive collateralization to the distal circumflex and third marginal via the LAD.  He had 1650% mid RCA stenoses and a dominant RCA.  Of note, he had probable high right radial artery takeoff with spasm/coarse stenosis above the elbow and there was retrograde filling of the brachial artery and the catheterization was transition to the femoral approach.  He underwent successful CABG revascularization surgery the following day by Dr. Roxy [name] on November 16, 2017 with a LIMA  graft to his LAD, SVG to first marginal and distal circumflex, and SVG to the PDA of his RCA with left thigh and calf endoscopic vein harvest.  He developed atrial fibrillation with RVR on postop day 2 and was started on IV amiodarone.  He ultimately converted back to sinus rhythm but developed recurrent AF on postop day 4 on metoprolol and amiodarone and ultimately converted back to sinus rhythm.  He was started on Eliquis on day 5 for anticoagulation.  He was discharged on November 22, 2017.  Approximately 10 days later he started to develop left lower extremity swelling and redness with pain in the back of his calf.  He was evaluated by Carl Handler, PA-C at [facility] on December 04, 2017.  There was no sign of infection.  Venous duplex imaging did not reveal any DVT.  He saw Carl [name] for initial follow-up evaluation on December 14, 2017 at which time he was maintaining sinus rhythm.  He will be participating in cardiac rehab and his orientation is scheduled for February 15, 2018.  He denied any recurrent chest pain.  His breathing has improved with walking.  He went to the beach and was walking at least a mile per day.  He is sleeping better and snoring is less.    Since I saw him in November 2019, he has continued to do exceptionally well.  He has been participating in cardiac rehab.  He is exercising regularly.  He denies chest pain PND orthopnea.  Subsequent laboratory showed further TSH elevation.  His levothyroxine was increased to 75 mcg.  Amiodarone was discontinued due to LFT elevation and his dose of rosuvastatin was reduced to one half of the current dose.  I last saw him in February 2020 at that time he had not had follow-up laboratory.  Participated in cardiac rehabilitation and also had joined a gym.  He had had follow-up laboratory which continued to show hypothyroidism and his dose of levothyroxine has gradually been increased.  Recent laboratory done by Dr. Rex Kras several months ago continues to  show a TSH was elevated and his levothyroxine dose was increased from 100 to 125 mcg.  During the COVID-19 pandemic, he admits to weight gain.  He has not been exercising as much as he had in the past.  Although he felt well, when recently seen by Dr. Durward Fortes he was felt to potentially be more short of breath.  He was advised to see me back for follow-up evaluation.  I scheduled him to undergo repeat laboratory which was done on October 05, 2018.  Hemoglobin 15.4, hematocrit 44.2.  Lipid studies were significantly improved from 6 months previously with total cholesterol now 143, triglycerides 142, HDL 48, LDL 67.  Brain natruretic peptide was 131.  TSH had significantly improved but was still elevated at 15.9.  He had normal renal function with a BUN of 15 creatinine 0.96.  LFTs  were significantly improved but minimally increased with an AST of 46 and ALT at 49.  He denied any chest pain or awareness of palpitations.  I saw him for reevaluation on October 08, 2018.  During that evaluation, I reviewed his laboratory and felt he was euvolemic.  His ECG showed normal sinus rhythm at 60 bpm with an isolated PVC.  His QTc interval was 434 ms.  With his continued TSH elevation, levothyroxine was further titrated to 150 mcg.  Subsequently, Carl [name] has continued to feel well.  He denied any awareness of palpitations.  Last week, in October 29, 2018 when checking his Apple watch he saw report that his heart rate was increased in the upper 130s.  He was unaware of this irregularity.  Upon further questioning, he had to go out of town over the last several days and believes he may have missed taking his morning metoprolol dose on Friday and Saturday and he could not remember if he had taken it yesterday morning.  He was supposed to be taken metoprolol tartrate 25 mg twice a day, HCTZ on a as needed basis which he has not needed.  He continues to be on Eliquis 5 mg twice a day and is tolerated the levothyroxine 150 mcg dose.   He is now on the vascepa which was just renewed last week 2 capsules twice a day, rosuvastatin 20 mg daily for mixed hyperlipidemia.  After receiving his phone call, I advised that he come to the office to check an EKG.  With his ECG showing atrial flutter at a rate of 130 he was added onto my schedule for further evaluation.  During that evaluation, he was asymptomatic and felt well.  His ECG showed atrial flutter with variable block at 130 bpm with nonspecific ST-T changes.  He was on Eliquis for anticoagulation.  I recommended titration of metoprolol tartrate to 50 mg twice a day.  Laboratory drawn that day revealed a TSH 5.95, magnesium 2.3, potassium 4.7, BUN 14 creatinine 0.9.   I saw him on September 28 for follow-up evaluation.  He was feeling well and continues to be unaware of his heart rate irregularity.  However according to his Apple Watch, his heart rate was running in the 115-120 range   He denied chest pain.  He denied tremors.  He was sleeping well and denied any awareness of sleep apnea.  During that evaluation we discussed gradually him for DC cardioversion but this would require COVID-19 testing with quarantine for 3 days prior to having the procedure done.  His daughter was coming into town that weekend.  As result we further titrated his metoprolol to 75 mg twice a day which he did for 4 days and then increase to 200 mg twice a day.  He continues to be on Eliquis for anticoagulation.  He admits to increased fatigue but denies chest pain.  He presents today for one-week follow-up evaluation.   Past Medical History:  Diagnosis Date   Anxiety    takes Xanax daily as needed   Arthritis    Cataract    removed both eyes   Depression    takes Celexa daily   Diverticulitis    Diverticulosis    Enlarged prostate    sightly   Esophagitis    Gastric ulcer    GERD (gastroesophageal reflux disease)    takes Protonix daily   Hx of adenomatous colonic polyps    benign    Hyperlipidemia    takes Zocor  daily   Hypertension    Insomnia    takes Ambien nightly   Joint pain    Joint swelling     Past Surgical History:  Procedure Laterality Date   CARDIAC CATHETERIZATION     cartliage removed from nose  25yr ago   cataract surgery Bilateral    CERVICAL SPINE SURGERY     COLONOSCOPY     CORONARY ARTERY BYPASS GRAFT N/A 11/16/2017   Procedure: CORONARY ARTERY BYPASS GRAFTING (CABG) x4. LEFT ENDOSCOPIC SAPHENOUS VEIN HARVEST AND MAMMARY ARTERY TAKE DOWN. LIMA TO LAD, SVG TO PDA, SVG TO DISTAL CIRC & OMI.;  Surgeon: Carl Isaac MD;  [facility]  Service: Open Heart Surgery;  Laterality: N/A;   FOREIGN BODY REMOVAL ESOPHAGEAL     KNEE SURGERY Left    couple of times   LEFT HEART CATH AND CORONARY ANGIOGRAPHY N/A 11/15/2017   Procedure: LEFT HEART CATH AND CORONARY ANGIOGRAPHY;  Surgeon: Carl Sine MD;  [facility];  Service: Cardiovascular;  Laterality: N/A;   NASAL SINUS SURGERY     POLYPECTOMY     TEE WITHOUT CARDIOVERSION N/A 11/16/2017   Procedure: TRANSESOPHAGEAL ECHOCARDIOGRAM (TEE);  Surgeon: Carl Isaac MD;  [facility]  Service: Open Heart Surgery;  Laterality: N/A;   TONSILLECTOMY     TOTAL KNEE ARTHROPLASTY Left 06/17/2014   Procedure: TOTAL KNEE ARTHROPLASTY;  Surgeon: PGarald Balding MD;  [facility]  Service: Orthopedics;  Laterality: Left;    Current Medications: Outpatient Medications Prior to Visit  Medication Sig Dispense Refill   acetaminophen (TYLENOL) 325 MG tablet Take 2 tablets (650 mg total) by mouth every 6 (six) hours as needed for mild pain.     acyclovir (ZOVIRAX) 400 MG tablet Take 400 mg by mouth 2 (two) times daily.     apixaban (ELIQUIS) 5 MG TABS tablet Take 1 tablet (5 mg total) by mouth 2 (two) times daily. 180 tablet 1   aspirin EC 81 MG tablet Take 1 tablet (81 mg total) by mouth daily. (Patient taking differently: Take 81 mg by mouth at  bedtime. ) 90 tablet 3   citalopram (CELEXA) 20 MG tablet Take 20 mg by mouth daily.     Collagen-Boron-Hyaluronic Acid ([employer] JOINT HEALTH TRIPLE ACTION PO) Take 1 tablet by mouth 2 (two) times daily.      Icosapent Ethyl (VASCEPA) 1 g CAPS Take 2 capsules (2 g total) by mouth 2 (two) times daily. 360 capsule 1   levothyroxine (SYNTHROID) 150 MCG tablet Take 1 tablet (150 mcg total) by mouth daily. (Patient taking differently: Take 150 mcg by mouth at bedtime. ) 90 tablet 1   Magnesium Carbonate POWD Take 4 g by mouth daily. Natural Vitality Calm     metoprolol tartrate (LOPRESSOR) 25 MG tablet Take 1 tablet (25 mg total) by mouth 2 (two) times daily. take with 50 mg to make 75 mg total (Patient not taking: Reported on 11/15/2018) 180 tablet 3   metoprolol tartrate (LOPRESSOR) 50 MG tablet Take 1 tablet (50 mg total) by mouth 2 (two) times daily. Take with an additional dose of  25 mg tablet to total 75 mg twice a day (Patient taking differently: Take 100 mg by mouth 2 (two) times daily. ) 60 tablet 4   Multiple Vitamins-Minerals (PRESERVISION AREDS 2 PO) Take 1 tablet by mouth 2 (two) times daily.     pantoprazole (PROTONIX) 20 MG tablet TAKE 2 TABLETS(40 MG) BY MOUTH DAILY (  Patient taking differently: Take 40 mg by mouth at bedtime. ) 180 tablet 3   rosuvastatin (CRESTOR) 20 MG tablet Take 1 tablet (20 mg total) by mouth daily. 90 tablet 2   zolpidem (AMBIEN) 10 MG tablet Take 10 mg by mouth at bedtime.      hydrochlorothiazide (MICROZIDE) 12.5 MG capsule Take 1 capsule (12.5 mg total) by mouth as needed (swelling). (Patient not taking: Reported on 11/15/2018) 30 capsule 3   No facility-administered medications prior to visit.      Allergies:   Patient has no known allergies.   Social History   Socioeconomic History   Marital status: Single    Spouse name: Not on file   Number of children: 3   Years of education: Not on file   Highest education level: Not on file    Occupational History   Occupation: Owner  Social Designer, fashion/clothing strain: Not very hard   Food insecurity    Worry: Never true    Inability: Never true   Transportation needs    Medical: No    Non-medical: No  Tobacco Use   Smoking status: Never Smoker   Smokeless tobacco: Never Used  Substance and Sexual Activity   Alcohol use: Yes    Alcohol/week: 10.0 standard drinks    Types: 10 Standard drinks or equivalent per week    Comment: 2 drinks 5 nights per week   Drug use: No   Sexual activity: Never  Lifestyle   Physical activity    Days per week: 4 days    Minutes per session: 30 min   Stress: Only a little  Relationships   Press photographer on phone: Not on file    Gets together: Not on file    Attends religious service: Not on file    Active member of club or organization: Not on file    Attends meetings of clubs or organizations: Not on file    Relationship status: Not on file  Other Topics Concern   Not on file  Social History Narrative   Not on file    Additional social history is notable in that he is divorced x2.  He has 3 children and his oldest son lives in [place].  His second son currently had lived [place] and was married in [place].  In June 2020 he moved back to the [place] area.  His daughter works in [place] as a Government social research officer in a Web designer.  There is no tobacco use.  He drinks alcohol.  Family History:  The patient's family history includes Colon cancer in his paternal grandfather.  Mother died at age 56 with emphysema.  His father died at 76.  He has 2 sisters ages 63 and 71.  ROS General: Negative; No fevers, chills, or night sweats; positive for fatigue HEENT: Decreased hearing with hearing aid in right ear, no sinus congestion, difficulty swallowing Pulmonary: Negative; No cough, wheezing, shortness of breath, hemoptysis Cardiovascular: Negative; No chest pain, presyncope, syncope,  palpitations GI: History of diverticular disease and colonic polyps. GU: Remote history of genital herpes; erectile dysfunction Musculoskeletal: Negative; no myalgias, joint pain, or weakness Hematologic/Oncology: Negative; no easy bruising, bleeding Endocrine: Positive for hypothyroidism Neuro: Negative; no changes in balance, headaches Skin: Negative; No rashes or skin lesions Psychiatric:  Sleep: Negative; No snoring, daytime sleepiness, hypersomnolence, bruxism, restless legs, hypnogognic hallucinations, no cataplexy Other comprehensive 14 point system review is negative.   PHYSICAL EXAM:  VS:  BP 133/88    Pulse 97    Ht 5' 9" (1.753 m)    Wt 208 lb (94.3 kg)    SpO2 96%    BMI 30.72 kg/m     Repeat blood pressure by me 128/86 , Wt Readings from Last 3 Encounters:  11/15/18 208 lb (94.3 kg)  11/05/18 202 lb (91.6 kg)  10/29/18 202 lb (91.6 kg)    General: Alert, oriented, no distress.  Skin: normal turgor, no rashes, warm and dry HEENT: Normocephalic, atraumatic. Pupils equal round and reactive to light; sclera anicteric; extraocular muscles intact;  Nose without nasal septal hypertrophy Mouth/Parynx benign; Mallinpatti scale 3 Neck: No JVD, no carotid bruits; normal carotid upstroke Lungs: clear to ausculatation and percussion; no wheezing or rales Chest wall: without tenderness to palpitation Heart: PMI not displaced, irregular rhythm with rate in the 90s,  s1 s2 normal, 1/6 systolic murmur, no diastolic murmur, no rubs, gallops, thrills, or heaves Abdomen: soft, nontender; no hepatosplenomehaly, BS+; abdominal aorta nontender and not dilated by palpation. Back: no CVA tenderness Pulses 2+ Musculoskeletal: full range of motion, normal strength, no joint deformities Extremities: no clubbing cyanosis or edema, Homan's sign negative  Neurologic: grossly nonfocal; Cranial nerves grossly wnl Psychologic: Normal mood and affect   Studies/Labs Reviewed:   ECG  (independently read by me): Atrial Flutter with variable block at 97 bpm  September 2020 ECG (independently read by me): Atrial flutter with variable block with an average heart rate at 115 bpm  October 29, 2018 ECG (independently read by me): Atrial flutter with variable block 130 bpm.  No specific ST-T changes  October 08, 2018 EKG:  EKG is ordered today.  ECG (independently read by me): NSR at 60; isolated PVC; QTc 434 msec.  April 02, 2018 ECG (independently read by me): NSR 62; Nonspecific T change; QTc 452 msec  December 29, 2017 ECG (independently read by me): Sinus bradycardia at 49 bpm.  Nonspecific ST changes.  QTc interval 464 ms.  No ectopy.  ECG (independently read by me): Sinus rhythm at 64 bpm.  Right bundle branch block with repolarization changes.  No ectopy.  October 24, 2017 ECG (independently read by me): Normal sinus rhythm at 62 bpm.  Right bundle branch block with repolarization changes.   ECG  April 18, 2014 revealed normal sinus rhythm.  Right bundle branch block was not present.  Recent Labs: BMP Latest Ref Rng & Units 10/29/2018 10/05/2018 04/03/2018  Glucose 65 - 99 mg/dL 92 107(H) 95  BUN 8 - 27 mg/dL _0 Creatinine 0.76 - 1.27 mg/dL 0.93 0.96 1.12  BUN/Creat Ratio 10 - _1 Sodium 134 - 144 mmol/L 137 139 139  Potassium 3.5 - 5.2 mmol/L 4.7 4.6 4.5  Chloride 96 - 106 mmol/L 100 100 100  CO2 20 - 29 mmol/L _2 Calcium 8.6 - 10.2 mg/dL 9.5 9.5 9.2     Hepatic Function Latest Ref Rng & Units 10/05/2018 04/03/2018 02/01/2018  Total Protein 6.0 - 8.5 g/dL 6.7 6.8 6.2  Albumin 3.7 - 4.7 g/dL 4.6 4.3 3.8  AST 0 - 40 IU/L 46(H) 32 62(H)  ALT 0 - 44 IU/L 49(H) 32 75(H)  Alk Phosphatase 39 - 117 IU/L 91 183(H) 249(H)  Total Bilirubin 0.0 - 1.2 mg/dL 0.4 0.4 0.3  Bilirubin, Direct 0.00 - 0.40 mg/dL - - 0.15    CBC Latest Ref Rng & Units 10/05/2018 04/03/2018 01/02/2018  WBC 3.4 -  10.8 x10E3/uL 6.6 6.0 6.2  Hemoglobin 13.0 - 17.7 g/dL  15.4 14.9 13.3  Hematocrit 37.5 - 51.0 % 44.2 44.9 40.1  Platelets 150 - 450 x10E3/uL 238 243 250   Lab Results  Component Value Date   MCV 96 10/05/2018   MCV 91 04/03/2018   MCV 98 (H) 01/02/2018   Lab Results  Component Value Date   TSH 5.950 (H) 10/29/2018   Lab Results  Component Value Date   HGBA1C 5.4 11/15/2017     BNP    Component Value Date/Time   BNP 131.4 (H) 10/05/2018 1010    ProBNP No results found for: PROBNP   Lipid Panel     Component Value Date/Time   CHOL 143 10/05/2018 1010   TRIG 142 10/05/2018 1010   HDL 48 10/05/2018 1010   CHOLHDL 3.0 10/05/2018 1010   CHOLHDL 3.4 01/29/2007 0410   VLDL 17 01/29/2007 0410   LDLCALC 67 10/05/2018 1010     RADIOLOGY: No results found.   Additional studies/ records that were reviewed today include:  I reviewed his previous evaluation with me from April 18, 2014.   ASSESSMENT:    1. Typical atrial flutter with variable block ([place])   2. S/P CABG x 4   3. Hypothyroidism, unspecified type   4. Medication management   5. Anticoagulated   6. Essential hypertension   7. 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  He has a history of mild hyperlipidemia and remotely had been on simvastatin 40 mg daily and has been followed by Dr. Hulan Fess.  In the fall 2019 he began to notice mild shortness of breath particularly with walking up steps.  When I saw him for initial evaluation an echo Doppler study revealed hyperdynamic LV function with an EF of 65 to 70% but with grade 2 diastolic dysfunction and abnormal tissue Doppler.  His CT coronary angiogram was suggestive of significant multivessel CAD and was FFR positive.  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 Roxy [name].  His postoperative  course was complicated by paroxysmal atrial fibrillation for which he was started on amiodarone and ultimately was discharged on Eliquis.  When I saw him for initial post hospital evaluation he was still on amiodarone 200 mg twice a day, metoprolol tartrate 25 mg twice a day.  His ECG  showed sinus bradycardia 5 weeks status post CABG revascularization; amiodarone was decreased to 200 mg daily and subsequently discontinued when subsequent laboratory showed LFT elevation and further increase in his TSH level.  Levothyroxine dose was increased several occasions earlier this summer was increased to 125 mcg following an increased level by his primary physician.  TSH from October 05, 2018 showed improvement but TSH was still increased at 15.9 and I   further titrated levothyroxine to 150 mcg.  He was maintaining sinus rhythm and denied any awareness of recurrent palpitations or arrhythmia.  He had inadvertently missed at least 2 morning doses of his beta-blocker therapy on September 18 and 19 with possible missing the morning dose of September 20 and was noted by his Apple Watch to be tachycardic with a pulse of 130.  ECG obtained in the office when seen by me as an add-on confirmed atrial flutter with variable block.  During that evaluation  metoprolol was increased to 50 mg twice a day.  At follow-up on November 05, 2018 he was still in atrial flutter with variable block and metoprolol was further titrated to 75 mg twice a day past 2 days he has been taking 100 mg twice a day.  He now wishes to pursue attempt at DC cardioversion.  He continues to be on Eliquis.  His ECG today confirms atrial flutter with variable block in the 90s.  November 16, 2018 we will recheck a chemistry profile as well as TSH level.  He will undergo a COVID test and we will schedule him to undergo elective DC cardioversion on Tuesday, October 13 at 2 PM.  The risk/benefits of the cardioversion were discussed.  He will continue current therapy as  prescribed.   Medication Adjustments/Labs and Tests Ordered: Current medicines are reviewed at length with the patient today.  Concerns regarding medicines are outlined above.  Medication changes, Labs and Tests ordered today are listed in the Patient Instructions below. Patient Instructions  Dear Denice Bors  You are scheduled for a Cardioversion on November 20, 2018 @ 2PM with [name].  Please arrive at the [facility] (Main Entrance A) at [facility]: [place], [place] [id number] at [facility] am/pm. (1 hour prior to procedure unless lab work is needed; if lab work is needed arrive 1.5 hours ahead)  DIET: Nothing to eat or drink after midnight except a sip of water with medications   Medication Instructions: Continue your anticoagulant: ELIQUIS You will need to continue your anticoagulant after your procedure until you  are told by your  Provider that it is safe to stop   Labs: If patient is on Coumadin, patient needs pt/INR, CBC, BMET within 3 days (No pt/INR needed for patients taking Xarelto, Eliquis, Pradaxa) For patients receiving anesthesia for TEE and all Cardioversion patients: BMET, CBC within 1 week  Come to: [facility] AFTER LABWORK IS DONE.  You must have a responsible person to drive you home and stay in the waiting area during your procedure. Failure to do so could result in cancellation.  Bring your insurance cards.  *Special Note: Every effort is made to have your procedure done on time. Occasionally there are emergencies that occur at the hospital that may cause delays. Please be patient if a delay does occur.       Signed, Shelva Majestic, MD  11/16/2018 6:24 PM    [facility] [address] [place], [place], [place]  [address] Phone: [phone]

## 2018-11-15 NOTE — Patient Instructions (Addendum)
Dear Carl [name]  You are scheduled for a Cardioversion on November 20, 2018 @ 2PM with [name].  Please arrive at the [facility] (Main Entrance A) at [facility]: [place], [place] [address] at [facility] am/pm. (1 hour prior to procedure unless lab work is needed; if lab work is needed arrive 1.5 hours ahead)  DIET: Nothing to eat or drink after midnight except a sip of water with medications   Medication Instructions: Continue your anticoagulant: ELIQUIS You will need to continue your anticoagulant after your procedure until you  are told by your  Provider that it is safe to stop   Labs: If patient is on Coumadin, patient needs pt/INR, CBC, BMET within 3 days (No pt/INR needed for patients taking Xarelto, Eliquis, Pradaxa) For patients receiving anesthesia for TEE and all Cardioversion patients: BMET, CBC within 1 week  Come to: [facility] AFTER LABWORK IS DONE.  You must have a responsible person to drive you home and stay in the waiting area during your procedure. Failure to do so could result in cancellation.  Bring your insurance cards.  *Special Note: Every effort is made to have your procedure done on time. Occasionally there are emergencies that occur at the hospital that may cause delays. Please be patient if a delay does occur.

## 2018-11-16 ENCOUNTER — Other Ambulatory Visit (HOSPITAL_COMMUNITY): Payer: PPO

## 2018-11-16 ENCOUNTER — Encounter: Payer: Self-pay | Admitting: Cardiovascular Disease

## 2018-11-16 ENCOUNTER — Other Ambulatory Visit (HOSPITAL_COMMUNITY)
Admission: RE | Admit: 2018-11-16 | Discharge: 2018-11-16 | Disposition: A | Discharge status: Home / Self Care | Payer: PPO | Source: Ambulatory Visit | Attending: Cardiovascular Disease | Admitting: Cardiovascular Disease

## 2018-11-16 DIAGNOSIS — Z01812 Encounter for preprocedural laboratory examination: Secondary | ICD-10-CM | POA: Insufficient documentation

## 2018-11-16 DIAGNOSIS — Z20828 Contact with and (suspected) exposure to other viral communicable diseases: Secondary | ICD-10-CM | POA: Diagnosis not present

## 2018-11-16 DIAGNOSIS — Z79899 Other long term (current) drug therapy: Secondary | ICD-10-CM | POA: Diagnosis not present

## 2018-11-16 DIAGNOSIS — I4891 Unspecified atrial fibrillation: Secondary | ICD-10-CM | POA: Diagnosis not present

## 2018-11-17 LAB — COMPREHENSIVE METABOLIC PANEL
ALT: 41 IU/L (ref 0–44)
AST: 37 IU/L (ref 0–40)
Albumin/Globulin Ratio: 1.6 (ref 1.2–2.2)
Albumin: 4.1 g/dL (ref 3.7–4.7)
Alkaline Phosphatase: 124 IU/L — ABNORMAL HIGH (ref 39–117)
BUN/Creatinine Ratio: 16 (ref 10–24)
BUN: 14 mg/dL (ref 8–27)
Bilirubin Total: 0.4 mg/dL (ref 0.0–1.2)
CO2: 22 mmol/L (ref 20–29)
Calcium: 9.2 mg/dL (ref 8.6–10.2)
Chloride: 100 mmol/L (ref 96–106)
Creatinine, Ser: 0.87 mg/dL (ref 0.76–1.27)
GFR calc Af Amer: 98 mL/min/{1.73_m2} (ref >59)
GFR calc non Af Amer: 85 mL/min/{1.73_m2} (ref >59)
Globulin, Total: 2.5 g/dL (ref 1.5–4.5)
Glucose: 103 mg/dL — ABNORMAL HIGH (ref 65–99)
Potassium: 4.8 mmol/L (ref 3.5–5.2)
Sodium: 138 mmol/L (ref 134–144)
Total Protein: 6.6 g/dL (ref 6.0–8.5)

## 2018-11-17 LAB — TSH: TSH: 5.73 u[IU]/mL — ABNORMAL HIGH (ref 0.450–4.500)

## 2018-11-17 LAB — CBC
Hematocrit: 40.2 % (ref 37.5–51.0)
Hemoglobin: 14 g/dL (ref 13.0–17.7)
MCH: 33.4 pg — ABNORMAL HIGH (ref 26.6–33.0)
MCHC: 34.8 g/dL (ref 31.5–35.7)
MCV: 96 fL (ref 79–97)
Platelets: 211 10*3/uL (ref 150–450)
RBC: 4.19 x10E6/uL (ref 4.14–5.80)
RDW: 12.5 % (ref 11.6–15.4)
WBC: 7.8 10*3/uL (ref 3.4–10.8)

## 2018-11-19 LAB — NOVEL CORONAVIRUS, NAA (HOSP ORDER, SEND-OUT TO REF LAB; TAT 18-24 HRS): SARS-CoV-2, NAA: NOT DETECTED

## 2018-11-20 ENCOUNTER — Ambulatory Visit (HOSPITAL_COMMUNITY): Payer: PPO | Admitting: Certified Registered Nurse Anesthetist

## 2018-11-20 ENCOUNTER — Other Ambulatory Visit: Payer: Self-pay

## 2018-11-20 ENCOUNTER — Ambulatory Visit (HOSPITAL_COMMUNITY)
Admission: RE | Admit: 2018-11-20 | Discharge: 2018-11-20 | Disposition: A | Discharge status: Home / Self Care | Payer: PPO | Source: Ambulatory Visit | Attending: Cardiovascular Disease | Admitting: Cardiovascular Disease

## 2018-11-20 ENCOUNTER — Encounter (HOSPITAL_COMMUNITY)
Admission: RE | Disposition: A | Discharge status: Home / Self Care | Payer: Self-pay | Source: Ambulatory Visit | Attending: Cardiovascular Disease

## 2018-11-20 ENCOUNTER — Encounter (HOSPITAL_COMMUNITY): Payer: Self-pay

## 2018-11-20 DIAGNOSIS — Z7982 Long term (current) use of aspirin: Secondary | ICD-10-CM | POA: Diagnosis not present

## 2018-11-20 DIAGNOSIS — K219 Gastro-esophageal reflux disease without esophagitis: Secondary | ICD-10-CM | POA: Diagnosis not present

## 2018-11-20 DIAGNOSIS — F419 Anxiety disorder, unspecified: Secondary | ICD-10-CM | POA: Diagnosis not present

## 2018-11-20 DIAGNOSIS — G4700 Insomnia, unspecified: Secondary | ICD-10-CM | POA: Diagnosis not present

## 2018-11-20 DIAGNOSIS — F329 Major depressive disorder, single episode, unspecified: Secondary | ICD-10-CM | POA: Diagnosis not present

## 2018-11-20 DIAGNOSIS — I10 Essential (primary) hypertension: Secondary | ICD-10-CM | POA: Insufficient documentation

## 2018-11-20 DIAGNOSIS — N400 Enlarged prostate without lower urinary tract symptoms: Secondary | ICD-10-CM | POA: Diagnosis not present

## 2018-11-20 DIAGNOSIS — I483 Typical atrial flutter: Secondary | ICD-10-CM | POA: Diagnosis not present

## 2018-11-20 DIAGNOSIS — I4891 Unspecified atrial fibrillation: Secondary | ICD-10-CM | POA: Diagnosis not present

## 2018-11-20 DIAGNOSIS — Z951 Presence of aortocoronary bypass graft: Secondary | ICD-10-CM | POA: Diagnosis not present

## 2018-11-20 DIAGNOSIS — Z79890 Hormone replacement therapy (postmenopausal): Secondary | ICD-10-CM | POA: Insufficient documentation

## 2018-11-20 DIAGNOSIS — I2510 Atherosclerotic heart disease of native coronary artery without angina pectoris: Secondary | ICD-10-CM | POA: Insufficient documentation

## 2018-11-20 DIAGNOSIS — Z79899 Other long term (current) drug therapy: Secondary | ICD-10-CM | POA: Insufficient documentation

## 2018-11-20 DIAGNOSIS — E039 Hypothyroidism, unspecified: Secondary | ICD-10-CM | POA: Diagnosis not present

## 2018-11-20 DIAGNOSIS — E785 Hyperlipidemia, unspecified: Secondary | ICD-10-CM | POA: Insufficient documentation

## 2018-11-20 DIAGNOSIS — M1990 Unspecified osteoarthritis, unspecified site: Secondary | ICD-10-CM | POA: Diagnosis not present

## 2018-11-20 HISTORY — PX: CARDIOVERSION: SHX1299

## 2018-11-20 SURGERY — CARDIOVERSION
Anesthesia: General

## 2018-11-20 MED ORDER — SODIUM CHLORIDE 0.9 % IV SOLN
INTRAVENOUS | Status: AC | PRN
  Administered 2018-11-20: 500 mL via INTRAVENOUS
  Administered 2018-11-20: 14:00:00 via INTRAVENOUS

## 2018-11-20 MED ORDER — PROPOFOL 10 MG/ML IV BOLUS
INTRAVENOUS | Status: DC | PRN
  Administered 2018-11-20: 70 mg via INTRAVENOUS
  Administered 2018-11-20: 10 mg via INTRAVENOUS

## 2018-11-20 MED ORDER — PHENYLEPHRINE HCL (PRESSORS) 10 MG/ML IV SOLN
INTRAVENOUS | Status: DC | PRN
  Administered 2018-11-20 (×5): 40 ug via INTRAVENOUS

## 2018-11-20 MED ORDER — LIDOCAINE 2% (20 MG/ML) 5 ML SYRINGE
INTRAMUSCULAR | Status: DC | PRN
  Administered 2018-11-20: 60 mg via INTRAVENOUS

## 2018-11-20 MED ORDER — METOPROLOL TARTRATE 25 MG PO TABS: 75.0000 mg | ORAL_TABLET | Freq: Two times a day (BID) | ORAL | 3 refills | Status: DC

## 2018-11-20 NOTE — CV Procedure (Signed)
  CARDIOVERSION NOTE   Procedure: Electrical Cardioversion Indications:  Atrial Flutter  Procedure Details:  Consent: Risks of procedure as well as the alternatives and risks of each were explained to the (patient/caregiver).  Consent for procedure obtained.  Time Out: Verified patient identification, verified procedure, site/side was marked, verified correct patient position, special equipment/implants available, medications/allergies/relevent history reviewed, required imaging and test results available.  Performed  Patient placed on cardiac monitor, pulse oximetry, supplemental oxygen as necessary.  Sedation given: lidocaine 60 mg; propofol 80 mg Pacer pads placed anterior and posterior chest.  Cardioverted 1 time(s).  Cardioverted at 120J.  Evaluation: Findings: Post procedure EKG shows: NSR Complications: None Patient did tolerate procedure well.  He developed hypotension to mid 60s prior to awakening from propofol and received 500 cc saline and neosynephrine 40 mcg x4.  BP improved to mid 80s. Loud snoring noted; will discuss sleep apnea evaluation as outpatient.    Troy Sine, MD, [facility] 11/20/2018 2:06 PM

## 2018-11-20 NOTE — Transfer of Care (Signed)
Immediate Anesthesia Transfer of Care Note  Patient: Carl [name]  Procedure(s) Performed: CARDIOVERSION (N/A )  Patient Location: PACU  Anesthesia Type:General  Level of Consciousness: drowsy and patient cooperative  Airway & Oxygen Therapy: Patient Spontanous Breathing  Post-op Assessment: Report given to RN, Post -op Vital signs reviewed and stable and Patient moving all extremities X 4  Post vital signs: Reviewed and stable  Last Vitals:  Vitals Value Taken Time  BP 80/50 11/20/18 1416  Temp    Pulse 55 11/20/18 1417  Resp 14 11/20/18 1417  SpO2 98 % 11/20/18 1417    Last Pain:  Vitals:   11/20/18 1305  TempSrc: Temporal  PainSc: 0-No pain         Complications: No apparent anesthesia complications

## 2018-11-20 NOTE — Interval H&P Note (Signed)
History and Physical Interval Note:  11/20/2018 1:57 PM  Carl [name]  has presented today for surgery, with the diagnosis of AFB.  The various methods of treatment have been discussed with the patient and family. After consideration of risks, benefits and other options for treatment, the patient has consented to  Procedure(s): CARDIOVERSION (N/A) as a surgical intervention.  The patient's history has been reviewed, patient examined, no change in status, stable for surgery.  I have reviewed the patient's chart and labs.  Questions were answered to the patient's satisfaction.     Shelva Majestic

## 2018-12-03 ENCOUNTER — Other Ambulatory Visit: Payer: Self-pay

## 2018-12-03 ENCOUNTER — Encounter (INDEPENDENT_AMBULATORY_CARE_PROVIDER_SITE_OTHER): Payer: PPO | Admitting: Ophthalmology

## 2018-12-03 ENCOUNTER — Encounter (HOSPITAL_COMMUNITY): Payer: Self-pay | Admitting: Cardiovascular Disease

## 2018-12-03 DIAGNOSIS — H353132 Nonexudative age-related macular degeneration, bilateral, intermediate dry stage: Secondary | ICD-10-CM | POA: Diagnosis not present

## 2018-12-03 DIAGNOSIS — H33301 Unspecified retinal break, right eye: Secondary | ICD-10-CM

## 2018-12-03 DIAGNOSIS — I10 Essential (primary) hypertension: Secondary | ICD-10-CM

## 2018-12-03 DIAGNOSIS — H43813 Vitreous degeneration, bilateral: Secondary | ICD-10-CM

## 2018-12-03 DIAGNOSIS — H35033 Hypertensive retinopathy, bilateral: Secondary | ICD-10-CM | POA: Diagnosis not present

## 2018-12-03 NOTE — Anesthesia Preprocedure Evaluation (Signed)
Anesthesia Evaluation  Patient identified by MRN, date of birth, ID band Patient awake    Reviewed: Allergy & Precautions, NPO status , Patient's Chart, lab work & pertinent test results  Airway Mallampati: II  TM Distance: >3 FB Neck ROM: Full    Dental no notable dental hx.    Pulmonary neg pulmonary ROS,    Pulmonary exam normal breath sounds clear to auscultation       Cardiovascular hypertension, Normal cardiovascular exam+ dysrhythmias Atrial Fibrillation  Rhythm:Irregular Rate:Normal     Neuro/Psych negative neurological ROS  negative psych ROS   GI/Hepatic Neg liver ROS, GERD  ,  Endo/Other  negative endocrine ROS  Renal/GU negative Renal ROS  negative genitourinary   Musculoskeletal negative musculoskeletal ROS (+)   Abdominal   Peds negative pediatric ROS (+)  Hematology negative hematology ROS (+)   Anesthesia Other Findings   Reproductive/Obstetrics negative OB ROS                             Anesthesia Physical Anesthesia Plan  ASA: III  Anesthesia Plan: General   Post-op Pain Management:    Induction: Intravenous  PONV Risk Score and Plan:   Airway Management Planned: Mask  Additional Equipment:   Intra-op Plan:   Post-operative Plan: Extubation in OR  Informed Consent: I have reviewed the patients History and Physical, chart, labs and discussed the procedure including the risks, benefits and alternatives for the proposed anesthesia with the patient or authorized representative who has indicated his/her understanding and acceptance.     Dental advisory given  Plan Discussed with: CRNA and Surgeon  Anesthesia Plan Comments:         Anesthesia Quick Evaluation

## 2018-12-03 NOTE — Anesthesia Postprocedure Evaluation (Signed)
Anesthesia Post Note  Patient: Carl [name]  Procedure(s) Performed: CARDIOVERSION (N/A )     Patient location during evaluation: PACU Anesthesia Type: General Level of consciousness: awake and alert Pain management: pain level controlled Vital Signs Assessment: post-procedure vital signs reviewed and stable Respiratory status: spontaneous breathing, nonlabored ventilation, respiratory function stable and patient connected to nasal cannula oxygen Cardiovascular status: blood pressure returned to baseline and stable Postop Assessment: no apparent nausea or vomiting Anesthetic complications: no    Last Vitals:  Vitals:   11/20/18 1458 11/20/18 1508  BP: 100/74 103/68  Pulse: (!) 59 (!) 58  Resp: 20 19  Temp:    SpO2: 100% 100%    Last Pain:  Vitals:   11/21/18 1508  TempSrc:   PainSc: 0-No pain                 Jenefer Woerner S

## 2018-12-05 DIAGNOSIS — R3121 Asymptomatic microscopic hematuria: Secondary | ICD-10-CM | POA: Diagnosis not present

## 2018-12-05 DIAGNOSIS — I480 Paroxysmal atrial fibrillation: Secondary | ICD-10-CM | POA: Diagnosis not present

## 2018-12-05 DIAGNOSIS — I10 Essential (primary) hypertension: Secondary | ICD-10-CM | POA: Diagnosis not present

## 2018-12-05 DIAGNOSIS — N3941 Urge incontinence: Secondary | ICD-10-CM | POA: Diagnosis not present

## 2018-12-05 DIAGNOSIS — N200 Calculus of kidney: Secondary | ICD-10-CM | POA: Diagnosis not present

## 2018-12-05 DIAGNOSIS — G459 Transient cerebral ischemic attack, unspecified: Secondary | ICD-10-CM | POA: Diagnosis not present

## 2018-12-05 DIAGNOSIS — I2510 Atherosclerotic heart disease of native coronary artery without angina pectoris: Secondary | ICD-10-CM | POA: Diagnosis not present

## 2018-12-05 DIAGNOSIS — E785 Hyperlipidemia, unspecified: Secondary | ICD-10-CM | POA: Diagnosis not present

## 2018-12-05 DIAGNOSIS — E032 Hypothyroidism due to medicaments and other exogenous substances: Secondary | ICD-10-CM | POA: Diagnosis not present

## 2018-12-05 DIAGNOSIS — H409 Unspecified glaucoma: Secondary | ICD-10-CM | POA: Diagnosis not present

## 2018-12-21 ENCOUNTER — Other Ambulatory Visit: Payer: Self-pay

## 2018-12-21 ENCOUNTER — Encounter: Payer: Self-pay | Admitting: Cardiovascular Disease

## 2018-12-21 ENCOUNTER — Ambulatory Visit (INDEPENDENT_AMBULATORY_CARE_PROVIDER_SITE_OTHER): Payer: PPO | Admitting: Cardiovascular Disease

## 2018-12-21 VITALS — BP 136/95 | HR 115 | Ht 69.0 in | Wt 205.0 lb

## 2018-12-21 DIAGNOSIS — I2510 Atherosclerotic heart disease of native coronary artery without angina pectoris: Secondary | ICD-10-CM | POA: Diagnosis not present

## 2018-12-21 DIAGNOSIS — E039 Hypothyroidism, unspecified: Secondary | ICD-10-CM | POA: Diagnosis not present

## 2018-12-21 DIAGNOSIS — R0683 Snoring: Secondary | ICD-10-CM

## 2018-12-21 DIAGNOSIS — Z7901 Long term (current) use of anticoagulants: Secondary | ICD-10-CM

## 2018-12-21 DIAGNOSIS — Z951 Presence of aortocoronary bypass graft: Secondary | ICD-10-CM | POA: Diagnosis not present

## 2018-12-21 DIAGNOSIS — E785 Hyperlipidemia, unspecified: Secondary | ICD-10-CM

## 2018-12-21 DIAGNOSIS — I4892 Unspecified atrial flutter: Secondary | ICD-10-CM

## 2018-12-21 MED ORDER — METOPROLOL TARTRATE 100 MG PO TABS: 100.0000 mg | ORAL_TABLET | Freq: Two times a day (BID) | ORAL | 3 refills | Status: DC

## 2018-12-21 NOTE — Progress Notes (Signed)
Cardiology Office Note    Date:  12/23/2018   ID:  Carl [name], DOB 1946/02/04, MRN [id number]  PCP:  Hulan Fess, MD  Cardiologist:  Shelva Majestic, MD    History of Present Illness:  Carl [name] is a 73 y.o. male who presents to the office today for a one-week follow-up evaluation for being found to be in atrial fibrillation when seen as an add-on last week.    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  In 2009 he developed transient numbness of his right face which lasted for several minutes and at that time apparently underwent evaluation and had a normal MRI, MRA, and carotid duplex studies.  He was found to have only mild to moderate plaque in the proximal right internal carotid artery without stenosis.  I had seen him in March 2016 for preoperative cardiology clearance prior to undergoing left knee replacement surgery by Dr. Durward Fortes.  Preoperative ECG suggested possible junctional rhythm which was new from an ECG of 2012 which previously had only shown sinus bradycardia.  He was asymptomatic.  When I saw him, his ECG showed sinus rhythm at 61 bpm with normal intervals and on that ECG P waves were normal and upright inferiorly.  Carl [name] has remained fairly active. In 2019  he had not been exercising as much as he had in the past still working out with a trainer and denied any associated chest pain or palpitations.  He in September 201 19 he began to notice more shortness of breath with walking up steps.  He was  at a friend's house and was told that he appeared to be more short of breath than previously.  I saw him for evaluation of his exertional shortness of breath on October 24, 2017.  At that time, I recommended that he undergo a 2D echo Doppler study and scheduled him for coronary CT angiography.  His echo Doppler study demonstrated hyperdynamic LV function with an EF of 65 to 70%.  Doppler parameters suggest grade 2  diastolic dysfunction and elevated ventricular end-diastolic filling pressure.  He had mild MR, mild TR, and mild dilation of his left atrium.  Pulmonary pressures were normal.  CT coronary angiography was performed on November 09, 2017.  This was abnormal and demonstrated an elevated calcium score a 25 which is 78th percentile for age and sex.  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  There was greater than 75% calcific plaque in his proximal and mid diagonal 1 vessel.  The circumflex had 50% proximal plaque.  There was 50 to 75% mixed plaque in the OM 2 vessel less than 50% in the OM1 vessel.  His RCA had 50% calcified plaque proximally, 50 to 75% calcified plaque in the mid vessel.  There was mild aortic root dilatation at 4.1 cm.  His CT images were referred for [facility] analysis which were positive in the mid RCA 0.78, the mid LAD 0.78 and in the AV groove circumflex at 0.71.   I saw him in follow-up of the above studies and recommended cardiac catheterization.  Catheterization was done in November 15, 2017 which showed severe multivessel CAD with 85% ostial LAD stenosis, diffuse 75% mid LAD stenosis, total occlusion of the mid AV groove circumflex after the takeoff of the second obtuse marginal vessel with  vessel with 50% diffuse stenosis in the first large marginal branch, 80 and 90% stenoses in the second obtuse marginal vessel and extensive collateralization to the distal circumflex and third marginal via the LAD.  He had 1650% mid RCA stenoses and a dominant RCA.  Of note, he had probable high right radial artery takeoff with spasm/coarse stenosis above the elbow and there was retrograde filling of the brachial artery and the catheterization was transition to the femoral approach.  He underwent successful CABG revascularization surgery the following day by Dr. Roxy Horseman on November 16, 2017 with a LIMA  graft to his LAD, SVG to first marginal and distal circumflex, and SVG to the PDA of his RCA with left thigh and calf endoscopic vein harvest.  He developed atrial fibrillation with RVR on postop day 2 and was started on IV amiodarone.  He ultimately converted back to sinus rhythm but developed recurrent AF on postop day 4 on metoprolol and amiodarone and ultimately converted back to sinus rhythm.  He was started on Eliquis on day 5 for anticoagulation.  He was discharged on November 22, 2017.  Approximately 10 days later he started to develop left lower extremity swelling and redness with pain in the back of his calf.  He was evaluated by Ellwood Handler, PA-C at [facility] on December 04, 2017.  There was no sign of infection.  Venous duplex imaging did not reveal any DVT.  He saw Dr. Servando Snare for initial follow-up evaluation on December 14, 2017 at which time he was maintaining sinus rhythm.  He will be participating in cardiac rehab and his orientation is scheduled for February 15, 2018.  He denied any recurrent chest pain.  His breathing has improved with walking.  He went to the beach and was walking at least a mile per day.  He is sleeping better and snoring is less.    Since I saw him in November 2019, he has continued to do exceptionally well.  He has been participating in cardiac rehab.  He is exercising regularly.  He denies chest pain PND orthopnea.  Subsequent laboratory showed further TSH elevation.  His levothyroxine was increased to 75 mcg.  Amiodarone was discontinued due to LFT elevation and his dose of rosuvastatin was reduced to one half of the current dose.  I last saw him in February 2020 at that time he had not had follow-up laboratory.  Participated in cardiac rehabilitation and also had joined a gym.  He had had follow-up laboratory which continued to show hypothyroidism and his dose of levothyroxine has gradually been increased.  Recent laboratory done by Dr. Rex Kras several months ago continues to  show a TSH was elevated and his levothyroxine dose was increased from 100 to 125 mcg.  During the COVID-19 pandemic, he admits to weight gain.  He has not been exercising as much as he had in the past.  Although he felt well, when recently seen by Dr. Durward Fortes he was felt to potentially be more short of breath.  He was advised to see me back for follow-up evaluation.  I scheduled him to undergo repeat laboratory which was done on October 05, 2018.  Hemoglobin 15.4, hematocrit 44.2.  Lipid studies were significantly improved from 6 months previously with total cholesterol now 143, triglycerides 142, HDL 48, LDL 67.  Brain natruretic peptide was 131.  TSH had significantly improved but was still elevated at 15.9.  He had normal renal function with a BUN of 15 creatinine 0.96.  LFTs  improved but minimally increased with an AST of 46 and ALT at 49.  He denied any chest pain or awareness of palpitations.  I saw him for reevaluation on October 08, 2018.  During that evaluation, I reviewed his laboratory and felt he was euvolemic.  His ECG showed normal sinus rhythm at 60 bpm with an isolated PVC.  His QTc interval was 434 ms.  With his continued TSH elevation, levothyroxine was further titrated to 150 mcg.  Subsequently, Carl [name] has continued to feel well.  He denied any awareness of palpitations.  Last week, in October 29, 2018 when checking his Apple watch he saw report that his heart rate was increased in the upper 130s.  He was unaware of this irregularity.  Upon further questioning, he had to go out of town over the last several days and believes he may have missed taking his morning metoprolol dose on Friday and Saturday and he could not remember if he had taken it yesterday morning.  He was supposed to be taken metoprolol tartrate 25 mg twice a day, HCTZ on a as needed basis which he has not needed.  He continues to be on Eliquis 5 mg twice a day and is tolerated the levothyroxine 150 mcg dose.   He is now on the vascepa which was just renewed last week 2 capsules twice a day, rosuvastatin 20 mg daily for mixed hyperlipidemia.  After receiving his phone call, I advised that he come to the office to check an EKG.  With his ECG showing atrial flutter at a rate of 130 he was added onto my schedule for further evaluation.  During that evaluation, he was asymptomatic and felt well.  His ECG showed atrial flutter with variable block at 130 bpm with nonspecific ST-T changes.  He was on Eliquis for anticoagulation.  I recommended titration of metoprolol tartrate to 50 mg twice a day.  Laboratory drawn that day revealed a TSH 5.95, magnesium 2.3, potassium 4.7, BUN 14 creatinine 0.9.   I saw him on September 28 for follow-up evaluation.  He was feeling well and continues to be unaware of his heart rate irregularity.  However according to his Apple Watch, his heart rate was running in the 115-120 range   He denied chest pain.  He denied tremors.  He was sleeping well and denied any awareness of sleep apnea.  During that evaluation we discussed gradually him for DC cardioversion but this would require COVID-19 testing with quarantine for 3 days prior to having the procedure done.  His daughter was coming into town that weekend.  As result we further titrated his metoprolol to 75 mg twice a day which he did for 4 days and then increase to 200 mg twice a day.  He continues to be on Eliquis for anticoagulation.  He admits to increased fatigue but denies chest pain.   I saw Carl [name] in follow-up on November 15, 2018.  At that time he had titrated the metoprolol up to 100 mg twice a day.  He underwent successful DC cardioversion on November 20 2018 with restoration of sinus rhythm.  Of note, he developed hypotension prior to awakening from propofol and received normal saline and Neo-Synephrine.  Blood pressure normalized loud snoring was noted, and it was advised to consider sleep study evaluation.  Over the last month, Carl [name]  had initially done well but it appears he had inadvertently reduced his metoprolol from I suggested dose reduction to 75 mg twice a day and  apparently has only been taking 25 mg twice a day.  In addition, he has had difficulty with sinus congestion and is taking medication with pseudoephedrine.  He called the office yesterday stating that his [place] had noted that his heart rate had been increased for almost a week and when I spoke with him yesterday in the office his resting pulse was 135.  At that time I recommended he increase metoprolol to 50 mg twice a day and see me today.  He denies any chest pain.  He is unaware of his heart rhythm and only became aware of the recurrent arrhythmia by virtue of his apple watch.  Past Medical History:  Diagnosis Date   Anxiety    takes Xanax daily as needed   Arthritis    Cataract    removed both eyes   Depression    takes Celexa daily   Diverticulitis    Diverticulosis    Enlarged prostate    sightly   Esophagitis    Gastric ulcer    GERD (gastroesophageal reflux disease)    takes Protonix daily   Hx of adenomatous colonic polyps    benign   Hyperlipidemia    takes Zocor daily   Hypertension    Insomnia    takes Ambien nightly   Joint pain    Joint swelling     Past Surgical History:  Procedure Laterality Date   CARDIAC CATHETERIZATION     CARDIOVERSION N/A 11/20/2018   Procedure: CARDIOVERSION;  Surgeon: Troy Sine, MD;  Location: [facility] ENDOSCOPY;  Service: Cardiovascular;  Laterality: N/A;   cartliage removed from nose  87yr ago   cataract surgery Bilateral    CERVICAL SPINE SURGERY     COLONOSCOPY     CORONARY ARTERY BYPASS GRAFT N/A 11/16/2017   Procedure: CORONARY ARTERY BYPASS GRAFTING (CABG) x4. LEFT ENDOSCOPIC SAPHENOUS VEIN HARVEST AND MAMMARY ARTERY TAKE DOWN. LIMA TO LAD, SVG TO PDA, SVG TO DISTAL CIRC & OMI.;  Surgeon: GGrace Isaac MD;  [facility]  Service: Open Heart Surgery;   Laterality: N/A;   FOREIGN BODY REMOVAL ESOPHAGEAL     KNEE SURGERY Left    couple of times   LEFT HEART CATH AND CORONARY ANGIOGRAPHY N/A 11/15/2017   Procedure: LEFT HEART CATH AND CORONARY ANGIOGRAPHY;  Surgeon: KTroy Sine MD;  [facility];  Service: Cardiovascular;  Laterality: N/A;   NASAL SINUS SURGERY     POLYPECTOMY     TEE WITHOUT CARDIOVERSION N/A 11/16/2017   Procedure: TRANSESOPHAGEAL ECHOCARDIOGRAM (TEE);  Surgeon: GGrace Isaac MD;  Location[facility]  Service: Open Heart Surgery;  Laterality: N/A;   TONSILLECTOMY     TOTAL KNEE ARTHROPLASTY Left 06/17/2014   Procedure: TOTAL KNEE ARTHROPLASTY;  Surgeon: PGarald Balding MD;  [facility]  Service: Orthopedics;  Laterality: Left;    Current Medications: Outpatient Medications Prior to Visit  Medication Sig Dispense Refill   acetaminophen (TYLENOL) 325 MG tablet Take 2 tablets (650 mg total) by mouth every 6 (six) hours as needed for mild pain.     acyclovir (ZOVIRAX) 400 MG tablet Take 400 mg by mouth 2 (two) times daily.     aspirin EC 81 MG tablet Take 1 tablet (81 mg total) by mouth daily. (Patient taking differently: Take 81 mg by mouth at bedtime. ) 90 tablet 3   azelastine (ASTELIN) 0.1 % nasal spray Place 1 spray into both nostrils daily as needed for rhinitis. Use  in each nostril as directed     citalopram (CELEXA) 20 MG tablet Take 20 mg by mouth daily.     Collagen-Boron-Hyaluronic Acid ([employer] JOINT HEALTH TRIPLE ACTION PO) Take 1 tablet by mouth 2 (two) times daily.      Dextromethorphan-guaiFENesin (MUCINEX FAST-MAX DM MAX PO) Take 1 tablet by mouth daily as needed (congestion).     hydrochlorothiazide (MICROZIDE) 12.5 MG capsule Take 1 capsule (12.5 mg total) by mouth as needed (swelling). 30 capsule 3   Icosapent Ethyl (VASCEPA) 1 g CAPS Take 2 capsules (2 g total) by mouth 2 (two) times daily. 360 capsule 1   levothyroxine (SYNTHROID) 150 MCG tablet Take 1 tablet  (150 mcg total) by mouth daily. (Patient taking differently: Take 150 mcg by mouth at bedtime. ) 90 tablet 1   Magnesium Carbonate POWD Take 4 g by mouth daily. Natural Vitality Calm     Multiple Vitamins-Minerals (PRESERVISION AREDS 2 PO) Take 1 tablet by mouth 2 (two) times daily.     pantoprazole (PROTONIX) 20 MG tablet TAKE 2 TABLETS(40 MG) BY MOUTH DAILY (Patient taking differently: Take 40 mg by mouth at bedtime. ) 180 tablet 3   rosuvastatin (CRESTOR) 20 MG tablet Take 1 tablet (20 mg total) by mouth daily. 90 tablet 2   zolpidem (AMBIEN) 10 MG tablet Take 10 mg by mouth at bedtime.      metoprolol tartrate (LOPRESSOR) 25 MG tablet Take 3 tablets (75 mg total) by mouth 2 (two) times daily. take with 50 mg to make 75 mg total 180 tablet 3   apixaban (ELIQUIS) 5 MG TABS tablet Take 1 tablet (5 mg total) by mouth 2 (two) times daily. 180 tablet 1   No facility-administered medications prior to visit.      Allergies:   Patient has no known allergies.   Social History   Socioeconomic History   Marital status: Single    Spouse name: Not on file   Number of children: 3   Years of education: Not on file   Highest education level: Not on file  Occupational History   Occupation: Owner  Social Designer, fashion/clothing strain: Not very hard   Food insecurity    Worry: Never true    Inability: Never true   Transportation needs    Medical: No    Non-medical: No  Tobacco Use   Smoking status: Never Smoker   Smokeless tobacco: Never Used  Substance and Sexual Activity   Alcohol use: Yes    Alcohol/week: 10.0 standard drinks    Types: 10 Standard drinks or equivalent per week    Comment: 2 drinks 5 nights per week   Drug use: No   Sexual activity: Never  Lifestyle   Physical activity    Days per week: 4 days    Minutes per session: 30 min   Stress: Only a little  Relationships   Press photographer on phone: Not on file    Gets together: Not on  file    Attends religious service: Not on file    Active member of club or organization: Not on file    Attends meetings of clubs or organizations: Not on file    Relationship status: Not on file  Other Topics Concern   Not on file  Social History Narrative   Not on file    Additional social history is notable in that he is divorced x2.  He has 3 children and his oldest  son lives in [place].  His second son currently had lived [place] and was married in [place].  In June 2020 he moved back to the [place] area.  His daughter works in [place] as a Government social research officer in a Web designer.  There is no tobacco use.  He drinks alcohol.  Family History:  The patient's family history includes Colon cancer in his paternal grandfather.  Mother died at age 37 with emphysema.  His father died at 7.  He has 2 sisters ages 75 and 74.  ROS General: Negative; No fevers, chills, or night sweats; positive for fatigue HEENT: Decreased hearing with hearing aid in right ear, no sinus congestion, difficulty swallowing Pulmonary: Negative; No cough, wheezing, shortness of breath, hemoptysis Cardiovascular: See HPI GI: History of diverticular disease and colonic polyps. GU: Remote history of genital herpes; erectile dysfunction Musculoskeletal: Negative; no myalgias, joint pain, or weakness Hematologic/Oncology: Negative; no easy bruising, bleeding Endocrine: Positive for hypothyroidism Neuro: Negative; no changes in balance, headaches Skin: Negative; No rashes or skin lesions Psychiatric:  Sleep: Positive snoring snoring, previous fatigue; no bruxism, restless legs, hypnogognic hallucinations, no cataplexy Other comprehensive 14 point system review is negative.   PHYSICAL EXAM:   VS:  BP (!) 136/95    Pulse (!) 115    Ht _0  (1.753 m)    Wt 205 lb (93 kg)    SpO2 97%    BMI 30.27 kg/m     Repeat blood pressure by me 132/88; variable pulse ranging from 98-115 , Wt Readings from Last 3  Encounters:  12/21/18 205 lb (93 kg)  11/15/18 208 lb (94.3 kg)  11/05/18 202 lb (91.6 kg)    General: Alert, oriented, no distress.  Skin: normal turgor, no rashes, warm and dry HEENT: Normocephalic, atraumatic. Pupils equal round and reactive to light; sclera anicteric; extraocular muscles intact;  Nose without nasal septal hypertrophy Mouth/Parynx benign; Mallinpatti scale 3 Neck: No JVD, no carotid bruits; normal carotid upstroke Lungs: clear to ausculatation and percussion; no wheezing or rales Chest wall: without tenderness to palpitation Heart: PMI not displaced, heart rate irregularity with pulse ranging from 98-115 , s1 s2 normal, 1/6 systolic murmur, no diastolic murmur, no rubs, gallops, thrills, or heaves Abdomen: soft, nontender; no hepatosplenomehaly, BS+; abdominal aorta nontender and not dilated by palpation. Back: no CVA tenderness Pulses 2+ Musculoskeletal: full range of motion, normal strength, no joint deformities Extremities: no clubbing cyanosis or edema, Homan's sign negative  Neurologic: grossly nonfocal; Cranial nerves grossly wnl Psychologic: Normal mood and affect   Studies/Labs Reviewed:   ECG (independently read by me): Atrial flutter with variable block at 115, QT 402,QTC 556 msec  November 15, 2018 ECG (independently read by me): Atrial Flutter with variable block at 97 bpm  September 2020 ECG (independently read by me): Atrial flutter with variable block with an average heart rate at 115 bpm  October 29, 2018 ECG (independently read by me): Atrial flutter with variable block 130 bpm.  No specific ST-T changes  October 08, 2018 EKG:  EKG is ordered today.  ECG (independently read by me): NSR at 60; isolated PVC; QTc 434 msec.  April 02, 2018 ECG (independently read by me): NSR 62; Nonspecific T change; QTc 452 msec  December 29, 2017 ECG (independently read by me): Sinus bradycardia at 49 bpm.  Nonspecific ST changes.  QTc interval 464 ms.  No  ectopy.  ECG (independently read by me): Sinus rhythm at 64 bpm.  Right bundle  branch block with repolarization changes.  No ectopy.  October 24, 2017 ECG (independently read by me): Normal sinus rhythm at 62 bpm.  Right bundle branch block with repolarization changes.   ECG  April 18, 2014 revealed normal sinus rhythm.  Right bundle branch block was not present.  Recent Labs: BMP Latest Ref Rng & Units 11/16/2018 10/29/2018 10/05/2018  Glucose 65 - 99 mg/dL 103(H) 92 107(H)  BUN 8 - 27 mg/dL _0 Creatinine 0.76 - 1.27 mg/dL 0.87 0.93 0.96  BUN/Creat Ratio 10 - _1 Sodium 134 - 144 mmol/L 138 137 139  Potassium 3.5 - 5.2 mmol/L 4.8 4.7 4.6  Chloride 96 - 106 mmol/L 100 100 100  CO2 20 - 29 mmol/L _2 Calcium 8.6 - 10.2 mg/dL 9.2 9.5 9.5     Hepatic Function Latest Ref Rng & Units 11/16/2018 10/05/2018 04/03/2018  Total Protein 6.0 - 8.5 g/dL 6.6 6.7 6.8  Albumin 3.7 - 4.7 g/dL 4.1 4.6 4.3  AST 0 - 40 IU/L 37 46(H) 32  ALT 0 - 44 IU/L 41 49(H) 32  Alk Phosphatase 39 - 117 IU/L 124(H) 91 183(H)  Total Bilirubin 0.0 - 1.2 mg/dL 0.4 0.4 0.4  Bilirubin, Direct 0.00 - 0.40 mg/dL - - -    CBC Latest Ref Rng & Units 11/16/2018 10/05/2018 04/03/2018  WBC 3.4 - 10.8 x10E3/uL 7.8 6.6 6.0  Hemoglobin 13.0 - 17.7 g/dL 14.0 15.4 14.9  Hematocrit 37.5 - 51.0 % 40.2 44.2 44.9  Platelets 150 - 450 x10E3/uL 211 238 243   Lab Results  Component Value Date   MCV 96 11/16/2018   MCV 96 10/05/2018   MCV 91 04/03/2018   Lab Results  Component Value Date   TSH 5.730 (H) 11/16/2018   Lab Results  Component Value Date   HGBA1C 5.4 11/15/2017     BNP    Component Value Date/Time   BNP 131.4 (H) 10/05/2018 1010    ProBNP No results found for: PROBNP   Lipid Panel     Component Value Date/Time   CHOL 143 10/05/2018 1010   TRIG 142 10/05/2018 1010   HDL 48 10/05/2018 1010   CHOLHDL 3.0 10/05/2018 1010   CHOLHDL 3.4 01/29/2007 0410   VLDL 17 01/29/2007 0410    LDLCALC 67 10/05/2018 1010     RADIOLOGY: No results found.   Additional studies/ records that were reviewed today include:  I reviewed his prior office visits and most recent DC cardioversion.   ASSESSMENT:    1. Atrial flutter with variable block ([place])   2. Anticoagulated   3. CAD in native artery   4. S/P CABG x 4   5. Hyperlipidemia LDL goal <70   6. Hypothyroidism, unspecified type   7. 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  He has a history of mild hyperlipidemia and remotely had been on simvastatin 40 mg daily and has been followed by Dr. Hulan Fess.  In the fall 2019 he began to notice mild shortness of breath particularly with walking up steps.  When I saw him for initial evaluation an echo Doppler study revealed hyperdynamic LV function with an EF of 65 to 70% but with grade 2 diastolic dysfunction and abnormal tissue Doppler.  His CT coronary angiogram was suggestive of significant multivessel CAD and was FFR positive.  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 Roxy Horseman.  His postoperative course was complicated by paroxysmal atrial fibrillation for which he was started on amiodarone and ultimately was discharged on Eliquis.  When I saw him for initial post hospital evaluation he was still on amiodarone 200 mg twice a day, metoprolol tartrate 25 mg twice a day.  His ECG  showed sinus bradycardia 5 weeks status post CABG revascularization; amiodarone was decreased to 200 mg daily and subsequently discontinued when subsequent laboratory showed LFT elevation and further increase in his TSH level.  Levothyroxine dose was increased several occasions earlier this summer was increased to 125 mcg following an increased level by his primary physician.  TSH from October 05, 2018 showed improvement but TSH was still increased at 15.9 and I further titrated levothyroxine to 150 mcg.  He was maintaining sinus rhythm and denied any awareness of recurrent palpitations or arrhythmia.  In September, he had inadvertently missed several morning doses of metoprol and was noted by his apple watch to have a tachycardic heart rate in the 130s.  Subsequent office follow-up demonstrated atrial flutter.  His dose of beta-blocker was ultimately titrated to 100 twice a day and he underwent successful cardioversion on November 20, 2018.  He had transient hypotension following cardioversion back to sinus rhythm which responded to fluids and Neo-Synephrine 40 mcg x 4.  Before he awakened, he had loud snoring.  I discussed with him the need for sleep evaluation for sleep apnea which I had discussed previously.  Apparently he had not yet returned to the office to see me but since his cardioversion instead of reducing his metoprolol down to 75 mg twice a day with plans to switch to long-acting succinate he had been inadvertently taking only 25 mg twice a day.  In addition, he has had sinus congestion and began taking OTC sinus medication with pseudoephedrine which undoubtedly contributed to development of recurrent arrhythmia.  His ECG today demonstrates atrial flutter with variable block at a rate of 115.  He believes he may have been in this for the past 5 to 7 days but since yesterday increase metoprolol to 50 mg twice a day last evening and this morning until his visit presently.  With his pulse still in the 90s to 115 range I will increase metoprolol back to 100 mg twice a day.  I discussed EP referral for consideration of atrial flutter ablation which typically is very successful but at present he would prefer stopping the pseudoephedrine and increasing the beta-blocker therapy.  His QTc interval in atrial flutter is increased and for this reason I will not initiate any other antiarrhythmic regimen  presently.  I have asked him to call me to let me know how his heart rate is and I plan to see him in 1 to 2 weeks for reevaluation.  I will also schedule him for a outpatient sleep study.  Medication Adjustments/Labs and Tests Ordered: Current medicines are reviewed at length with the patient today.  Concerns regarding medicines are outlined above.  Medication changes, Labs and Tests ordered today are listed in the Patient Instructions below. Patient Instructions  Medication Instructions:  Your physician has recommended you make the following change in your medication:   INCREASE YOUR METOPROLOL TARTRATE (LOPRESSOR) TO 100 MG BY MOUTH TWICE A DAY  *If you need a refill on your cardiac medications before your next  appointment, please call your pharmacy*  Lab Work: NONE If you have labs (blood work) drawn today and your tests are completely normal, you will receive your results only by:  [place] (if you have MyChart) OR  A paper copy in the mail If you have any lab test that is abnormal or we need to change your treatment, we will call you to review the results.  Testing/Procedures: Your physician has recommended that you have a sleep study. This test records several body functions during sleep, including: brain activity, eye movement, oxygen and carbon dioxide blood levels, heart rate and rhythm, breathing rate and rhythm, the flow of air through your mouth and nose, snoring, body muscle movements, and chest and belly movement.   Follow-Up: At [facility], you and your health needs are our priority.  As part of our continuing mission to provide you with exceptional heart care, we have created designated Provider Care Teams.  These Care Teams include your primary Cardiologist (physician) and Advanced Practice Providers (APPs -  Physician Assistants and Nurse Practitioners) who all work together to provide you with the care you need, when you need it.  Your next appointment:     01/02/2019  The format for your next appointment:   In Person  Provider:   You may see Shelva Majestic, MD or one of the following Advanced Practice Providers on your designated Care Team:    Almyra Deforest, PA-C  Fabian Sharp, [place] or   Roby Lofts, [place]   Other Instructions   OTC Cold Meds for Cardiac Patients  Avoid medications that contain the following ingredients:  Phenylephrine  Pseudoephedrine  Ephedrine  Naphazoline  Oxymetalozine  Dextromethorphan   **In general, for patients with colds, our pharmacists suggest Coricidin HBP or just some Benadryl.  You can use guaifenesin (Mucinex; no Mucinex D) for cough and acetaminophen (Tylenol) for aches/pains.**       Signed, Shelva Majestic, MD  12/23/2018 10:16 AM    [facility] [address], [place], [place], [place]  [address] Phone: [phone]

## 2018-12-21 NOTE — Patient Instructions (Signed)
Medication Instructions:  Your physician has recommended you make the following change in your medication:   INCREASE YOUR METOPROLOL TARTRATE (LOPRESSOR) TO 100 MG BY MOUTH TWICE A DAY  *If you need a refill on your cardiac medications before your next appointment, please call your pharmacy*  Lab Work: NONE If you have labs (blood work) drawn today and your tests are completely normal, you will receive your results only by: [place] [facility] MyChart Message (if you have MyChart) OR . A paper copy in the mail If you have any lab test that is abnormal or we need to change your treatment, we will call you to review the results.  Testing/Procedures: Your physician has recommended that you have a sleep study. This test records several body functions during sleep, including: brain activity, eye movement, oxygen and carbon dioxide blood levels, heart rate and rhythm, breathing rate and rhythm, the flow of air through your mouth and nose, snoring, body muscle movements, and chest and belly movement.   Follow-Up: At [facility], you and your health needs are our priority.  As part of our continuing mission to provide you with exceptional heart care, we have created designated Provider Care Teams.  These Care Teams include your primary Cardiologist (physician) and Advanced Practice Providers (APPs -  Physician Assistants and Nurse Practitioners) who all work together to provide you with the care you need, when you need it.  Your next appointment:   01/02/2019  The format for your next appointment:   In Person  Provider:   You may see Shelva Majestic, MD or one of the following Advanced Practice Providers on your designated Care Team:    Almyra Deforest, PA-C  Fabian Sharp, [place] or   Roby Lofts, [place]   Other Instructions   OTC Cold Meds for Cardiac Patients  Avoid medications that contain the following  ingredients:  Phenylephrine  Pseudoephedrine  Ephedrine  Naphazoline  Oxymetalozine  Dextromethorphan   **In general, for patients with colds, our pharmacists suggest Coricidin HBP or just some Benadryl.  You can use guaifenesin (Mucinex; no Mucinex D) for cough and acetaminophen (Tylenol) for aches/pains.**

## 2018-12-23 ENCOUNTER — Encounter: Payer: Self-pay | Admitting: Cardiovascular Disease

## 2018-12-25 ENCOUNTER — Telehealth: Payer: Self-pay | Admitting: *Deleted

## 2018-12-25 NOTE — Telephone Encounter (Signed)
Patient notified of sleep study and COVID test appointments.

## 2019-01-02 ENCOUNTER — Telehealth: Payer: Self-pay

## 2019-01-02 ENCOUNTER — Ambulatory Visit (INDEPENDENT_AMBULATORY_CARE_PROVIDER_SITE_OTHER): Payer: PPO | Admitting: Cardiovascular Disease

## 2019-01-02 ENCOUNTER — Other Ambulatory Visit: Payer: Self-pay

## 2019-01-02 ENCOUNTER — Encounter: Payer: Self-pay | Admitting: Cardiovascular Disease

## 2019-01-02 VITALS — BP 128/89 | HR 129 | Temp 95.2°F | Ht 69.0 in | Wt 197.0 lb

## 2019-01-02 DIAGNOSIS — I2510 Atherosclerotic heart disease of native coronary artery without angina pectoris: Secondary | ICD-10-CM | POA: Diagnosis not present

## 2019-01-02 DIAGNOSIS — I4892 Unspecified atrial flutter: Secondary | ICD-10-CM | POA: Diagnosis not present

## 2019-01-02 DIAGNOSIS — Z951 Presence of aortocoronary bypass graft: Secondary | ICD-10-CM

## 2019-01-02 DIAGNOSIS — E039 Hypothyroidism, unspecified: Secondary | ICD-10-CM

## 2019-01-02 DIAGNOSIS — G4733 Obstructive sleep apnea (adult) (pediatric): Secondary | ICD-10-CM | POA: Diagnosis not present

## 2019-01-02 DIAGNOSIS — E785 Hyperlipidemia, unspecified: Secondary | ICD-10-CM

## 2019-01-02 NOTE — Telephone Encounter (Signed)
Call placed to Pt.  Advised appt with GT on 01/10/2019 at 9;15 am  Scheduled for ablation 12/4 at 1:30 pm

## 2019-01-02 NOTE — Patient Instructions (Addendum)
Medication Instructions:  Your physician recommends that you continue on your current medications as directed. Please refer to the Current Medication list given to you today.  *If you need a refill on your cardiac medications before your next appointment, please call your pharmacy*  Lab Work: You will need a COVID-19 test prior to your ablation. Your appointment is at 1:00pm on 01/08/2019  Go to: [facility] Entrance [place], [place] [address] FOR YOUR COVID-19 TEST. YOU MUST HAVE YOUR COVID-19 TEST COMPLETED 4 DAYS PRIOR TO YOUR UPCOMING PROCEDURE/TEST. YOU WILL ALSO NEED TO SELF-ISOLATE AFTER THE COVID-19 TEST UNTIL THE DAY OF YOUR PROCEDURE/TEST. PLEASE BRING YOUR I.D. AND YOUR INSURANCE CARD(S) WITH YOU.    If you have labs (blood work) drawn today and your tests are completely normal, you will receive your results only by: [place] [facility] MyChart Message (if you have MyChart) OR . A paper copy in the mail If you have any lab test that is abnormal or we need to change your treatment, we will call you to review the results.  Testing/Procedures: Your physician has recommended that you have an ablation. Catheter ablation is a medical procedure used to treat some cardiac arrhythmias (irregular heartbeats). During catheter ablation, a long, thin, flexible tube is put into a blood vessel in your groin (upper thigh), or neck. This tube is called an ablation catheter. It is then guided to your heart through the blood vessel. Radio frequency waves destroy small areas of heart tissue where abnormal heartbeats may cause an arrhythmia to start. Please see the instruction sheet given to you today.  FOLLOW UP WITH DR. Lovena Le REGARDING ABLATION.  YOU WILL BE CONTACTED BY DR. Tanna Furry PRIMARY NURSE TO SET UP YOUR OFFICE VISIT WITH DR. Lovena Le.   Follow-Up: At [facility], you and your health needs are our priority.  As part of our continuing mission to provide you with  exceptional heart care, we have created designated Provider Care Teams.  These Care Teams include your primary Cardiologist (physician) and Advanced Practice Providers (APPs -  Physician Assistants and Nurse Practitioners) who all work together to provide you with the care you need, when you need it.  Your next appointment:   APPROXIMATELY 4 week(s)  The format for your next appointment:   In Person  Provider:   Shelva Majestic, MD  Other Instructions [place]

## 2019-01-02 NOTE — Progress Notes (Signed)
Cardiology Office Note    Date:  01/03/2019   ID:  Carl [name], DOB 1945/07/18, MRN [id number]  PCP:  Hulan Fess, MD  Cardiologist:  Shelva Majestic, MD    History of Present Illness:  Carl [name] is a 73 y.o. male who presents to the office today for a  follow-up evaluation of his recurrent atrial flutter.  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  In 2009 he developed transient numbness of his right face which lasted for several minutes and at that time apparently underwent evaluation and had a normal MRI, MRA, and carotid duplex studies.  He was found to have only mild to moderate plaque in the proximal right internal carotid artery without stenosis.  I had seen him in March 2016 for preoperative cardiology clearance prior to undergoing left knee replacement surgery by Dr. Durward Fortes.  Preoperative ECG suggested possible junctional rhythm which was new from an ECG of 2012 which previously had only shown sinus bradycardia.  He was asymptomatic.  When I saw him, his ECG showed sinus rhythm at 61 bpm with normal intervals and on that ECG P waves were normal and upright inferiorly.  Carl [name] has remained fairly active. In 2019  he had not been exercising as much as he had in the past still working out with a trainer and denied any associated chest pain or palpitations.  He in September 201 19 he began to notice more shortness of breath with walking up steps.  He was  at a friend's house and was told that he appeared to be more short of breath than previously.  I saw him for evaluation of his exertional shortness of breath on October 24, 2017.  At that time, I recommended that he undergo a 2D echo Doppler study and scheduled him for coronary CT angiography.  His echo Doppler study demonstrated hyperdynamic LV function with an EF of 65 to 70%.  Doppler parameters suggest grade 2 diastolic dysfunction and elevated ventricular  end-diastolic filling pressure.  He had mild MR, mild TR, and mild dilation of his left atrium.  Pulmonary pressures were normal.  CT coronary angiography was performed on November 09, 2017.  This was abnormal and demonstrated an elevated calcium score a 25 which is 78th percentile for age and sex.  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  There was greater than 75% calcific plaque in his proximal and mid diagonal 1 vessel.  The circumflex had 50% proximal plaque.  There was 50 to 75% mixed plaque in the OM 2 vessel less than 50% in the OM1 vessel.  His RCA had 50% calcified plaque proximally, 50 to 75% calcified plaque in the mid vessel.  There was mild aortic root dilatation at 4.1 cm.  His CT images were referred for [facility] analysis which were positive in the mid RCA 0.78, the mid LAD 0.78 and in the AV groove circumflex at 0.71.   I saw him in follow-up of the above studies and recommended cardiac catheterization.  Catheterization was done in November 15, 2017 which showed severe multivessel CAD with 85% ostial LAD stenosis, diffuse 75% mid LAD stenosis, total occlusion of the mid AV groove circumflex after the takeoff of the second obtuse marginal vessel with 50% diffuse stenosis in the first large marginal branch, 80 and 90%  stenoses in the second obtuse marginal vessel and extensive collateralization to the distal circumflex and third marginal via the LAD.  He had 1650% mid RCA stenoses and a dominant RCA.  Of note, he had probable high right radial artery takeoff with spasm/coarse stenosis above the elbow and there was retrograde filling of the brachial artery and the catheterization was transition to the femoral approach.  He underwent successful CABG revascularization surgery the following day by Dr. Roxy Horseman on November 16, 2017 with a LIMA graft to his LAD, SVG to first marginal and distal  circumflex, and SVG to the PDA of his RCA with left thigh and calf endoscopic vein harvest.  He developed atrial fibrillation with RVR on postop day 2 and was started on IV amiodarone.  He ultimately converted back to sinus rhythm but developed recurrent AF on postop day 4 on metoprolol and amiodarone and ultimately converted back to sinus rhythm.  He was started on Eliquis on day 5 for anticoagulation.  He was discharged on November 22, 2017.  Approximately 10 days later he started to develop left lower extremity swelling and redness with pain in the back of his calf.  He was evaluated by Ellwood Handler, PA-C at [facility] on December 04, 2017.  There was no sign of infection.  Venous duplex imaging did not reveal any DVT.  He saw Dr. Servando Snare for initial follow-up evaluation on December 14, 2017 at which time he was maintaining sinus rhythm.  He will be participating in cardiac rehab and his orientation is scheduled for February 15, 2018.  He denied any recurrent chest pain.  His breathing has improved with walking.  He went to the beach and was walking at least a mile per day.  He is sleeping better and snoring is less.    Since his November 2019 and prior to his February 2020 evaluation he was doing well and was  participating in cardiac rehabilitation and also had joined a gym. He was exercising regularly.  He denied chest pain, PND, or orthopnea.  Subsequent laboratory showed further TSH elevation.  His levothyroxine was increased to 75 mcg.  Amiodarone was discontinued due to LFT elevation and his dose of rosuvastatin was reduced to one half of the current dose.  I  saw him in February 2020 at that time he had not had follow-up laboratory.    He had had follow-up laboratory which continued to show hypothyroidism and his dose of levothyroxine has gradually been increased.  Recent laboratory done by Dr. Rex Kras several months ago continues to show a TSH was elevated and his levothyroxine dose was increased from 100 to 125  mcg.  During the COVID-19 pandemic, he admits to weight gain.  He has not been exercising as much as he had in the past.  Although he felt well, when recently seen by Dr. Durward Fortes he was felt to potentially be more short of breath.  He was advised to see me back for follow-up evaluation.  I scheduled him to undergo repeat laboratory which was done on October 05, 2018.  Hemoglobin 15.4, hematocrit 44.2.  Lipid studies were significantly improved from 6 months previously with total cholesterol now 143, triglycerides 142, HDL 48, LDL 67.  Brain natruretic peptide was 131.  TSH had significantly improved but was still elevated at 15.9.  He had normal renal function with a BUN of 15 creatinine 0.96.  LFTs were significantly improved but minimally increased with an AST of 46 and ALT at 49.  He  denied any chest pain or awareness of palpitations.  I saw him for reevaluation on October 08, 2018.  During that evaluation, I reviewed his laboratory and felt he was euvolemic.  His ECG showed normal sinus rhythm at 60 bpm with an isolated PVC.  His QTc interval was 434 ms.  With his continued TSH elevation, levothyroxine was further titrated to 150 mcg.  Subsequently, Bartolo has continued to feel well.  He denied any awareness of palpitations.  Last week, in October 29, 2018 when checking his Apple watch he saw report that his heart rate was increased in the upper 130s.  He was unaware of this irregularity.  Upon further questioning, he had to go out of town over the last several days and believes he may have missed taking his morning metoprolol dose on Friday and Saturday and he could not remember if he had taken it yesterday morning.  He was supposed to be taken metoprolol tartrate 25 mg twice a day, HCTZ on a as needed basis which he has not needed.  He continues to be on Eliquis 5 mg twice a day and is tolerated the levothyroxine 150 mcg dose.  He is now on the vascepa which was just renewed last week 2 capsules twice a day,  rosuvastatin 20 mg daily for mixed hyperlipidemia.  After receiving his phone call, I advised that he come to the office to check an EKG.  With his ECG showing atrial flutter at a rate of 130 he was added onto my schedule for further evaluation.  During that evaluation, he was asymptomatic and felt well.  His ECG showed atrial flutter with variable block at 130 bpm with nonspecific ST-T changes.  He was on Eliquis for anticoagulation.  I recommended titration of metoprolol tartrate to 50 mg twice a day.  Laboratory drawn that day revealed a TSH 5.95, magnesium 2.3, potassium 4.7, BUN 14 creatinine 0.9.   I saw him on September 28 for follow-up evaluation.  He was feeling well and continue to be unaware of his heart rate irregularity.  However according to his Apple Watch, his heart rate was running in the 115-120 range   He denied chest pain.  He denied tremors.  He was sleeping well and denied any awareness of sleep apnea.  During that evaluation we discussed gradually him for DC cardioversion but this would require COVID-19 testing with quarantine for 3 days prior to having the procedure done.  His daughter was coming into town that weekend.  As result we further titrated his metoprolol to 75 mg twice a day which he did for 4 days and then increase to 200 mg twice a day.  He continues to be on Eliquis for anticoagulation.  He admits to increased fatigue but denies chest pain.   I saw Quamir in follow-up on November 15, 2018.  At that time he had titrated the metoprolol up to 100 mg twice a day.  He underwent successful DC cardioversion on November 20, 2018 with restoration of sinus rhythm.  Of note, he developed hypotension prior to awakening from propofol and received normal saline and Neo-Synephrine.  Blood pressure normalized loud snoring was noted, and it was advised to consider sleep study evaluation.  Over the month after his cardioversion Nasario had initially done well but  he had inadvertently reduced his  metoprolol from I suggested dose reduction to 75 mg twice a day and apparently has only been taking 25 mg twice a day.  In addition, he  has had difficulty with sinus congestion and was taking medication with pseudoephedrine.  He called the office on 12/20/18  stating that his Apple Watch had noted that his heart rate had been increased for almost a week and when I spoke with him his resting pulse was 135.  At that time I recommended he increase metoprolol to 50 mg twice a day and see me on the following day on December 21, 2018.  During that evaluation, he remained in atrial flutter with variable block at a rate of 115 bpm.  Recommended titration of metoprolol tartrate to 100 mg twice a day.  I discussed EP evaluation with consideration for atrial flutter ablation.  I again will had a lengthy discussion regarding the need for a sleep study due to high likelihood of obstructive sleep apnea.  He initially preferred trying the metoprolol increased dose to see if this would benefit him prior to an immediate EP evaluation.  He continues to be asymptomatic with reference to awareness of his fast heartbeat.  He was tentatively scheduled to undergo a sleep study on January 09, 2019.  He presents to the office today for follow-up evaluation.  He continues to be unaware of chest pain.  He denies any shortness of breath.  He is going out of town for Thanksgiving.  Past Medical History:  Diagnosis Date   Anxiety    takes Xanax daily as needed   Arthritis    Cataract    removed both eyes   Depression    takes Celexa daily   Diverticulitis    Diverticulosis    Enlarged prostate    sightly   Esophagitis    Gastric ulcer    GERD (gastroesophageal reflux disease)    takes Protonix daily   Hx of adenomatous colonic polyps    benign   Hyperlipidemia    takes Zocor daily   Hypertension    Insomnia    takes Ambien nightly   Joint pain    Joint swelling     Past Surgical History:  Procedure  Laterality Date   CARDIAC CATHETERIZATION     CARDIOVERSION N/A 11/20/2018   Procedure: CARDIOVERSION;  Surgeon: Troy Sine, MD;  Location[facility] ENDOSCOPY;  Service: Cardiovascular;  Laterality: N/A;   cartliage removed from nose  58yr ago   cataract surgery Bilateral    CERVICAL SPINE SURGERY     COLONOSCOPY     CORONARY ARTERY BYPASS GRAFT N/A 11/16/2017   Procedure: CORONARY ARTERY BYPASS GRAFTING (CABG) x4. LEFT ENDOSCOPIC SAPHENOUS VEIN HARVEST AND MAMMARY ARTERY TAKE DOWN. LIMA TO LAD, SVG TO PDA, SVG TO DISTAL CIRC & OMI.;  Surgeon: GGrace Isaac MD;  [facility]  Service: Open Heart Surgery;  Laterality: N/A;   FOREIGN BODY REMOVAL ESOPHAGEAL     KNEE SURGERY Left    couple of times   LEFT HEART CATH AND CORONARY ANGIOGRAPHY N/A 11/15/2017   Procedure: LEFT HEART CATH AND CORONARY ANGIOGRAPHY;  Surgeon: KTroy Sine MD;  [facility];  Service: Cardiovascular;  Laterality: N/A;   NASAL SINUS SURGERY     POLYPECTOMY     TEE WITHOUT CARDIOVERSION N/A 11/16/2017   Procedure: TRANSESOPHAGEAL ECHOCARDIOGRAM (TEE);  Surgeon: GGrace Isaac MD;  [facility]  Service: Open Heart Surgery;  Laterality: N/A;   TONSILLECTOMY     TOTAL KNEE ARTHROPLASTY Left 06/17/2014   Procedure: TOTAL KNEE ARTHROPLASTY;  Surgeon: PGarald Balding MD;  [facility]  Service: Orthopedics;  Laterality: Left;    Current Medications: Outpatient Medications Prior to Visit  Medication Sig Dispense Refill   acetaminophen (TYLENOL) 325 MG tablet Take 2 tablets (650 mg total) by mouth every 6 (six) hours as needed for mild pain.     acyclovir (ZOVIRAX) 400 MG tablet Take 400 mg by mouth 2 (two) times daily.     aspirin EC 81 MG tablet Take 1 tablet (81 mg total) by mouth daily. (Patient taking differently: Take 81 mg by mouth at bedtime. ) 90 tablet 3   azelastine (ASTELIN) 0.1 % nasal spray Place 1 spray into both nostrils daily as needed for  rhinitis. Use in each nostril as directed     citalopram (CELEXA) 20 MG tablet Take 20 mg by mouth daily.     Collagen-Boron-Hyaluronic Acid ([employer] JOINT HEALTH TRIPLE ACTION PO) Take 1 tablet by mouth 2 (two) times daily.      Dextromethorphan-guaiFENesin (MUCINEX FAST-MAX DM MAX PO) Take 1 tablet by mouth daily as needed (congestion).     Icosapent Ethyl (VASCEPA) 1 g CAPS Take 2 capsules (2 g total) by mouth 2 (two) times daily. 360 capsule 1   levothyroxine (SYNTHROID) 150 MCG tablet Take 1 tablet (150 mcg total) by mouth daily. (Patient taking differently: Take 150 mcg by mouth at bedtime. ) 90 tablet 1   Magnesium Carbonate POWD Take 4 g by mouth daily. Natural Vitality Calm     metoprolol tartrate (LOPRESSOR) 100 MG tablet Take 1 tablet (100 mg total) by mouth 2 (two) times daily. 180 tablet 3   Multiple Vitamins-Minerals (PRESERVISION AREDS 2 PO) Take 1 tablet by mouth 2 (two) times daily.     pantoprazole (PROTONIX) 20 MG tablet TAKE 2 TABLETS(40 MG) BY MOUTH DAILY (Patient taking differently: Take 40 mg by mouth at bedtime. ) 180 tablet 3   rosuvastatin (CRESTOR) 20 MG tablet Take 1 tablet (20 mg total) by mouth daily. 90 tablet 2   zolpidem (AMBIEN) 10 MG tablet Take 10 mg by mouth at bedtime.      apixaban (ELIQUIS) 5 MG TABS tablet Take 1 tablet (5 mg total) by mouth 2 (two) times daily. 180 tablet 1   hydrochlorothiazide (MICROZIDE) 12.5 MG capsule Take 1 capsule (12.5 mg total) by mouth as needed (swelling). 30 capsule 3   No facility-administered medications prior to visit.      Allergies:   Patient has no known allergies.   Social History   Socioeconomic History   Marital status: Single    Spouse name: Not on file   Number of children: 3   Years of education: Not on file   Highest education level: Not on file  Occupational History   Occupation: Owner  Social Designer, fashion/clothing strain: Not very hard   Food insecurity    Worry: Never true     Inability: Never true   Transportation needs    Medical: No    Non-medical: No  Tobacco Use   Smoking status: Never Smoker   Smokeless tobacco: Never Used  Substance and Sexual Activity   Alcohol use: Yes    Alcohol/week: 10.0 standard drinks    Types: 10 Standard drinks or equivalent per week    Comment: 2 drinks 5 nights per week   Drug use: No   Sexual activity: Never  Lifestyle   Physical activity    Days per week: 4 days    Minutes per session: 30 min   Stress: Only a little  Relationships  Social Herbalist on phone: Not on file    Gets together: Not on file    Attends religious service: Not on file    Active member of club or organization: Not on file    Attends meetings of clubs or organizations: Not on file    Relationship status: Not on file  Other Topics Concern   Not on file  Social History Narrative   Not on file    Additional social history is notable in that he is divorced x2.  He has 3 children and his oldest son lives in [place].  His second son currently had lived [place] and was married in [place].  In June 2020 he moved back to the [place] area.  His daughter works in [place] as a Government social research officer in a Web designer.  There is no tobacco use.  He drinks alcohol.  Family History:  The patient's family history includes Colon cancer in his paternal grandfather.  Mother died at age 75 with emphysema.  His father died at 28.  He has 2 sisters ages 34 and 29.  ROS General: Negative; No fevers, chills, or night sweats; positive for fatigue HEENT: Decreased hearing with hearing aid in right ear, no sinus congestion, difficulty swallowing Pulmonary: Negative; No cough, wheezing, shortness of breath, hemoptysis Cardiovascular: See HPI GI: History of diverticular disease and colonic polyps. GU: Remote history of genital herpes; erectile dysfunction Musculoskeletal: Negative; no myalgias, joint pain, or  weakness Hematologic/Oncology: Negative; no easy bruising, bleeding Endocrine: Positive for hypothyroidism Neuro: Negative; no changes in balance, headaches Skin: Negative; No rashes or skin lesions Psychiatric:  Sleep: Positive snoring snoring, previous fatigue; no bruxism, restless legs, hypnogognic hallucinations, no cataplexy Other comprehensive 14 point system review is negative.   PHYSICAL EXAM:   VS:  BP 128/89    Pulse (!) 129    Temp (!) 95.2 F (35.1 C)    Ht 5' 9"  (1.753 m)    Wt 197 lb (89.4 kg)    SpO2 95%    BMI 29.09 kg/m     Repeat blood pressure today was 126/84.  Pulse was 130 bpm and regular. , Wt Readings from Last 3 Encounters:  01/02/19 197 lb (89.4 kg)  12/21/18 205 lb (93 kg)  11/15/18 208 lb (94.3 kg)    General: Alert, oriented, no distress.  Skin: normal turgor, no rashes, warm and dry HEENT: Normocephalic, atraumatic. Pupils equal round and reactive to light; sclera anicteric; extraocular muscles intact;  Nose without nasal septal hypertrophy Mouth/Parynx benign; Mallinpatti scale 3 Neck: No JVD, no carotid bruits; normal carotid upstroke Lungs: clear to ausculatation and percussion; no wheezing or rales Chest wall: without tenderness to palpitation Heart: PMI not displaced, regular tachycardia at 130 bpm, s1 s2 normal, 1/6 systolic murmur, no diastolic murmur, no rubs, gallops, thrills, or heaves Abdomen: soft, nontender; no hepatosplenomehaly, BS+; abdominal aorta nontender and not dilated by palpation. Back: no CVA tenderness Pulses 2+ Musculoskeletal: full range of motion, normal strength, no joint deformities Extremities: no clubbing cyanosis or edema, Homan's sign negative  Neurologic: grossly nonfocal; Cranial nerves grossly wnl Psychologic: Normal mood and affect    Studies/Labs Reviewed:   ECG (independently read by me): Atrial flutter with 2-1 AV conduction with a ventricular rate of 130 bpm.  December 21, 2018 ECG (independently read  by me): Atrial flutter with variable block at 115, QT 402,QTC 556 msec  Cardioversion November 20, 2018 with restoration of sinus rhythm.  November 15, 2018 ECG (independently read by me): Atrial Flutter with variable block at 97 bpm  September 2020 ECG (independently read by me): Atrial flutter with variable block with an average heart rate at 115 bpm  October 29, 2018 ECG (independently read by me): Atrial flutter with variable block 130 bpm.  No specific ST-T changes  October 08, 2018 ECG (independently read by me): NSR at 60; isolated PVC; QTc 434 msec.  April 02, 2018 ECG (independently read by me): NSR 62; Nonspecific T change; QTc 452 msec  December 29, 2017 ECG (independently read by me): Sinus bradycardia at 49 bpm.  Nonspecific ST changes.  QTc interval 464 ms.  No ectopy.  ECG (independently read by me): Sinus rhythm at 64 bpm.  Right bundle branch block with repolarization changes.  No ectopy.  October 24, 2017 ECG (independently read by me): Normal sinus rhythm at 62 bpm.  Right bundle branch block with repolarization changes.   ECG  April 18, 2014 revealed normal sinus rhythm.  Right bundle branch block was not present.  Recent Labs: BMP Latest Ref Rng & Units 11/16/2018 10/29/2018 10/05/2018  Glucose 65 - 99 mg/dL 103(H) 92 107(H)  BUN 8 - 27 mg/dL 14 14 15   Creatinine 0.76 - 1.27 mg/dL 0.87 0.93 0.96  BUN/Creat Ratio 10 - 24 16 15 16   Sodium 134 - 144 mmol/L 138 137 139  Potassium 3.5 - 5.2 mmol/L 4.8 4.7 4.6  Chloride 96 - 106 mmol/L 100 100 100  CO2 20 - 29 mmol/L 22 22 22   Calcium 8.6 - 10.2 mg/dL 9.2 9.5 9.5     Hepatic Function Latest Ref Rng & Units 11/16/2018 10/05/2018 04/03/2018  Total Protein 6.0 - 8.5 g/dL 6.6 6.7 6.8  Albumin 3.7 - 4.7 g/dL 4.1 4.6 4.3  AST 0 - 40 IU/L 37 46(H) 32  ALT 0 - 44 IU/L 41 49(H) 32  Alk Phosphatase 39 - 117 IU/L 124(H) 91 183(H)  Total Bilirubin 0.0 - 1.2 mg/dL 0.4 0.4 0.4  Bilirubin, Direct 0.00 - 0.40 mg/dL - - -     CBC Latest Ref Rng & Units 11/16/2018 10/05/2018 04/03/2018  WBC 3.4 - 10.8 x10E3/uL 7.8 6.6 6.0  Hemoglobin 13.0 - 17.7 g/dL 14.0 15.4 14.9  Hematocrit 37.5 - 51.0 % 40.2 44.2 44.9  Platelets 150 - 450 x10E3/uL 211 238 243   Lab Results  Component Value Date   MCV 96 11/16/2018   MCV 96 10/05/2018   MCV 91 04/03/2018   Lab Results  Component Value Date   TSH 5.730 (H) 11/16/2018   Lab Results  Component Value Date   HGBA1C 5.4 11/15/2017     BNP    Component Value Date/Time   BNP 131.4 (H) 10/05/2018 1010    ProBNP No results found for: PROBNP   Lipid Panel     Component Value Date/Time   CHOL 143 10/05/2018 1010   TRIG 142 10/05/2018 1010   HDL 48 10/05/2018 1010   CHOLHDL 3.0 10/05/2018 1010   CHOLHDL 3.4 01/29/2007 0410   VLDL 17 01/29/2007 0410   LDLCALC 67 10/05/2018 1010     RADIOLOGY: No results found.   Additional studies/ records that were reviewed today include:  I reviewed his prior office visits and most recent DC cardioversion.   ASSESSMENT:    1. Atrial flutter with 2:1 block ([place])   2. CAD in native artery   3. S/P CABG x 4   4. Hyperlipidemia LDL goal <70  5. Hypothyroidism, unspecified type   6. Evaluate for OSA (obstructive sleep apnea)    PLAN:  Mr. 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  He has a history of mild hyperlipidemia and remotely had been on simvastatin 40 mg daily and has been followed by Dr. Hulan Fess.  In the fall 2019 he began to notice mild shortness of breath particularly with walking up steps.  When I saw him for initial evaluation an echo Doppler study revealed hyperdynamic LV function with an EF of 65 to 70% but with grade 2 diastolic dysfunction and abnormal tissue Doppler.  A  CT coronary angiogram was suggestive of significant multivessel CAD and was FFR positive.  He was found to have significant multivessel CAD at catheterization leading to  urgent CABG revascularization surgery the following day which was successfully done by Dr. Roxy Horseman.  His postoperative course was complicated by paroxysmal atrial fibrillation for which he was started on amiodarone and ultimately was discharged on Eliquis.  When I saw him for initial post hospital evaluation he was still on amiodarone 200 mg twice a day, metoprolol tartrate 25 mg twice a day.  His ECG  showed sinus bradycardia 5 weeks status post CABG revascularization; amiodarone was decreased to 200 mg daily and subsequently discontinued when subsequent laboratory showed LFT elevation and further increase in his TSH level.  Levothyroxine dose was increased several occasions earlier this summer was increased to 125 mcg following an increased level by his primary physician.  TSH from October 05, 2018 showed improvement but TSH was still increased at 15.9 and I further titrated levothyroxine to 150 mcg.  He was maintaining sinus rhythm and denied any awareness of recurrent palpitations or arrhythmia.  In September, he had inadvertently missed several morning doses of metoprolol and was noted by his apple watch to have a tachycardic heart rate in the 130s.  Subsequent office follow-up demonstrated atrial flutter.  His dose of beta-blocker was ultimately titrated to 100 twice a day and he underwent successful cardioversion on November 20, 2018.  He had transient hypotension following cardioversion back to sinus rhythm which responded to fluids and Neo-Synephrine 40 mcg x 4.  Before he awakened, he had loud snoring.  I discussed with him the need for sleep evaluation for sleep apnea which I had discussed previously.  He had not yet returned to the office to see me but since his cardioversion instead of reducing his metoprolol down to 75 mg twice a day with plans to switch to long-acting succinate he had been inadvertently taking only 25 mg twice a day.  In addition, he developed sinus congestion and began taking OTC sinus  medication with pseudoephedrine which undoubtedly contributed to development of recurrent arrhythmia.  I saw him as an add-on on December 21, 2018 his his ECG  demonstrated atrial flutter with variable block at a rate of 115.  He believes he may have been in this for the past 5 to 7 days but since yesterday by my phone conversation he increased metoprolol to 50 mg twice a day last evening and this morning until his visit presently.  At his office visit following day on December 21, 2018 with his pulse still in the 90s to 115 range I increased metoprolol back to 100 mg twice a day.  In that evaluation I discussed EP referral for atrial flutter ablation.  I strongly recommended discontinuing all pseudoephedrine products.  He preferred to  see if the increase metoprolol would be effective.  On reevaluation today he continues to be in atrial flutter despite being on metoprolol tartrate 100 mg twice a day.  It appears that he is now in 2-1 flutter with an atrial rate of 260 and ventricular rate at 130.  I contacted Dr. Crissie Sickles her to potentially adding any additional agent and discussed the need for EP atrial flutter ablation.  Dr. Renaldo Harrison will see the patient next week on January 10, 2019 annual schedule him for an atrial flutter ablation to be done the morning of January 11, 2019.  With Dacian still being in atrial flutter with rapid response I have canceled his scheduled split-night sleep study which had been arranged for December 2 and we will schedule this to be done following his atrial flutter ablation in approximately 2 weeks thereafter.  Lamine will undergo Covid testing prior to his evaluations next week in anticipation of his atrial flutter ablation. .  Medication Adjustments/Labs and Tests Ordered: Current medicines are reviewed at length with the patient today.  Concerns regarding medicines are outlined above.  Medication changes, Labs and Tests ordered today are listed in the Patient Instructions  below. Patient Instructions  Medication Instructions:  Your physician recommends that you continue on your current medications as directed. Please refer to the Current Medication list given to you today.  *If you need a refill on your cardiac medications before your next appointment, please call your pharmacy*  Lab Work: You will need a COVID-19 test prior to your ablation. Your appointment is at 1:00pm on 01/08/2019  Go to: [facility] Entrance [place], [place] [address] FOR YOUR COVID-19 TEST. YOU MUST HAVE YOUR COVID-19 TEST COMPLETED 4 DAYS PRIOR TO YOUR UPCOMING PROCEDURE/TEST. YOU WILL ALSO NEED TO SELF-ISOLATE AFTER THE COVID-19 TEST UNTIL THE DAY OF YOUR PROCEDURE/TEST. PLEASE BRING YOUR I.D. AND YOUR INSURANCE CARD(S) WITH YOU.    If you have labs (blood work) drawn today and your tests are completely normal, you will receive your results only by:  [employer] (if you have MyChart) OR  A paper copy in the mail If you have any lab test that is abnormal or we need to change your treatment, we will call you to review the results.  Testing/Procedures: Your physician has recommended that you have an ablation. Catheter ablation is a medical procedure used to treat some cardiac arrhythmias (irregular heartbeats). During catheter ablation, a long, thin, flexible tube is put into a blood vessel in your groin (upper thigh), or neck. This tube is called an ablation catheter. It is then guided to your heart through the blood vessel. Radio frequency waves destroy small areas of heart tissue where abnormal heartbeats may cause an arrhythmia to start. Please see the instruction sheet given to you today.  FOLLOW UP WITH DR. Lovena Le REGARDING ABLATION.  YOU WILL BE CONTACTED BY DR. Tanna Furry PRIMARY NURSE TO SET UP YOUR OFFICE VISIT WITH DR. Lovena Le.   Follow-Up: At [facility], you and your health needs are our priority.  As part of our continuing  mission to provide you with exceptional heart care, we have created designated Provider Care Teams.  These Care Teams include your primary Cardiologist (physician) and Advanced Practice Providers (APPs -  Physician Assistants and Nurse Practitioners) who all work together to provide you with the care you need, when you need it.  Your next appointment:   APPROXIMATELY 4 week(s)  The format for your  next appointment:   In Person  Provider:   Shelva Majestic, MD  Other Instructions [place]     Signed, Shelva Majestic, MD  01/03/2019 9:39 AM    [facility] [address], [place], [place], [place]  [address] Phone: [phone]

## 2019-01-03 ENCOUNTER — Encounter: Payer: Self-pay | Admitting: Cardiovascular Disease

## 2019-01-07 ENCOUNTER — Other Ambulatory Visit (HOSPITAL_COMMUNITY): Payer: PPO

## 2019-01-08 ENCOUNTER — Other Ambulatory Visit (HOSPITAL_COMMUNITY)
Admission: RE | Admit: 2019-01-08 | Discharge: 2019-01-08 | Disposition: A | Discharge status: Home / Self Care | Payer: PPO | Source: Ambulatory Visit | Attending: Internal Medicine | Admitting: Internal Medicine

## 2019-01-08 DIAGNOSIS — Z01812 Encounter for preprocedural laboratory examination: Secondary | ICD-10-CM | POA: Diagnosis not present

## 2019-01-08 DIAGNOSIS — Z20828 Contact with and (suspected) exposure to other viral communicable diseases: Secondary | ICD-10-CM | POA: Diagnosis not present

## 2019-01-08 DIAGNOSIS — I5032 Chronic diastolic (congestive) heart failure: Secondary | ICD-10-CM

## 2019-01-08 HISTORY — DX: Chronic diastolic (congestive) heart failure: I50.32

## 2019-01-08 HISTORY — PX: NM MYOVIEW LTD: HXRAD82

## 2019-01-09 ENCOUNTER — Encounter (HOSPITAL_BASED_OUTPATIENT_CLINIC_OR_DEPARTMENT_OTHER): Payer: PPO | Admitting: Cardiovascular Disease

## 2019-01-09 LAB — NOVEL CORONAVIRUS, NAA (HOSP ORDER, SEND-OUT TO REF LAB; TAT 18-24 HRS): SARS-CoV-2, NAA: NOT DETECTED

## 2019-01-10 ENCOUNTER — Encounter: Payer: Self-pay | Admitting: Internal Medicine

## 2019-01-10 ENCOUNTER — Other Ambulatory Visit: Payer: Self-pay

## 2019-01-10 ENCOUNTER — Ambulatory Visit: Payer: PPO | Admitting: Internal Medicine

## 2019-01-10 VITALS — BP 128/80 | HR 59 | Ht 69.0 in | Wt 207.6 lb

## 2019-01-10 DIAGNOSIS — I480 Paroxysmal atrial fibrillation: Secondary | ICD-10-CM | POA: Diagnosis not present

## 2019-01-10 DIAGNOSIS — E782 Mixed hyperlipidemia: Secondary | ICD-10-CM | POA: Diagnosis not present

## 2019-01-10 DIAGNOSIS — H409 Unspecified glaucoma: Secondary | ICD-10-CM | POA: Diagnosis not present

## 2019-01-10 DIAGNOSIS — I483 Typical atrial flutter: Secondary | ICD-10-CM | POA: Diagnosis not present

## 2019-01-10 DIAGNOSIS — E032 Hypothyroidism due to medicaments and other exogenous substances: Secondary | ICD-10-CM | POA: Diagnosis not present

## 2019-01-10 DIAGNOSIS — I2510 Atherosclerotic heart disease of native coronary artery without angina pectoris: Secondary | ICD-10-CM | POA: Diagnosis not present

## 2019-01-10 DIAGNOSIS — I10 Essential (primary) hypertension: Secondary | ICD-10-CM | POA: Diagnosis not present

## 2019-01-10 DIAGNOSIS — G459 Transient cerebral ischemic attack, unspecified: Secondary | ICD-10-CM | POA: Diagnosis not present

## 2019-01-10 DIAGNOSIS — E785 Hyperlipidemia, unspecified: Secondary | ICD-10-CM | POA: Diagnosis not present

## 2019-01-10 LAB — BASIC METABOLIC PANEL
BUN/Creatinine Ratio: 16 (ref 10–24)
BUN: 14 mg/dL (ref 8–27)
CO2: 21 mmol/L (ref 20–29)
Calcium: 9.4 mg/dL (ref 8.6–10.2)
Chloride: 101 mmol/L (ref 96–106)
Creatinine, Ser: 0.9 mg/dL (ref 0.76–1.27)
GFR calc Af Amer: 97 mL/min/{1.73_m2} (ref >59)
GFR calc non Af Amer: 84 mL/min/{1.73_m2} (ref >59)
Glucose: 116 mg/dL — ABNORMAL HIGH (ref 65–99)
Potassium: 4.8 mmol/L (ref 3.5–5.2)
Sodium: 138 mmol/L (ref 134–144)

## 2019-01-10 LAB — CBC WITH DIFFERENTIAL/PLATELET
Basophils Absolute: 0.1 10*3/uL (ref 0.0–0.2)
Basos: 1 %
EOS (ABSOLUTE): 0.4 10*3/uL (ref 0.0–0.4)
Eos: 5 %
Hematocrit: 45.2 % (ref 37.5–51.0)
Hemoglobin: 15.4 g/dL (ref 13.0–17.7)
Immature Grans (Abs): 0 10*3/uL (ref 0.0–0.1)
Immature Granulocytes: 0 %
Lymphocytes Absolute: 1.6 10*3/uL (ref 0.7–3.1)
Lymphs: 21 %
MCH: 32.4 pg (ref 26.6–33.0)
MCHC: 34.1 g/dL (ref 31.5–35.7)
MCV: 95 fL (ref 79–97)
Monocytes Absolute: 0.9 10*3/uL (ref 0.1–0.9)
Monocytes: 12 %
Neutrophils Absolute: 4.6 10*3/uL (ref 1.4–7.0)
Neutrophils: 61 %
Platelets: 204 10*3/uL (ref 150–450)
RBC: 4.76 x10E6/uL (ref 4.14–5.80)
RDW: 12.3 % (ref 11.6–15.4)
WBC: 7.7 10*3/uL (ref 3.4–10.8)

## 2019-01-10 NOTE — H&P (View-Only) (Signed)
HPI [name] is referred today by [name] for evaluation of atrial flutter. He is a pleasant 73 yo man with a h/o CAD, HTN, s/p CABG. He subsequently developed atrial flutter and was cardioverted but has had recurrent atrial flutter and is referred to consider catheter ablation. He is asymptomatic. His rate has been difficult to control despite high dose beta blocker therapy. The patient has not had syncope and denies chest pain or sob. He has been taking his anti-coagulation.  No Known Allergies   Current Outpatient Medications  Medication Sig Dispense Refill  . acetaminophen (TYLENOL) 500 MG tablet Take 1,000 mg by mouth every 6 (six) hours as needed for moderate pain or headache.    Marland Kitchen acyclovir (ZOVIRAX) 400 MG tablet Take 400 mg by mouth 2 (two) times daily.    Marland Kitchen aspirin EC 81 MG tablet Take 1 tablet (81 mg total) by mouth daily. (Patient taking differently: Take 81 mg by mouth at bedtime. ) 90 tablet 3  . citalopram (CELEXA) 20 MG tablet Take 20 mg by mouth daily.    . ferrous sulfate 325 (65 FE) MG tablet Take 325 mg by mouth daily.    . Glucosamine HCl (GLUCOSAMINE PO) Take 1 tablet by mouth 2 (two) times daily.    Vanessa Kick Ethyl (VASCEPA) 1 g CAPS Take 2 capsules (2 g total) by mouth 2 (two) times daily. 360 capsule 1  . levothyroxine (SYNTHROID) 150 MCG tablet Take 1 tablet (150 mcg total) by mouth daily. (Patient taking differently: Take 150 mcg by mouth at bedtime. ) 90 tablet 1  . metoprolol tartrate (LOPRESSOR) 100 MG tablet Take 1 tablet (100 mg total) by mouth 2 (two) times daily. 180 tablet 3  . Multiple Vitamins-Minerals (PRESERVISION AREDS 2 PO) Take 1 tablet by mouth 2 (two) times daily.    . pantoprazole (PROTONIX) 20 MG tablet TAKE 2 TABLETS(40 MG) BY MOUTH DAILY (Patient taking differently: Take 40 mg by mouth at bedtime. ) 180 tablet 3  . rosuvastatin (CRESTOR) 20 MG tablet Take 1 tablet (20 mg total) by mouth daily. 90 tablet 2  . zolpidem (AMBIEN) 10 MG  tablet Take 10 mg by mouth at bedtime.     Marland Kitchen apixaban (ELIQUIS) 5 MG TABS tablet Take 1 tablet (5 mg total) by mouth 2 (two) times daily. 180 tablet 1  . hydrochlorothiazide (MICROZIDE) 12.5 MG capsule Take 1 capsule (12.5 mg total) by mouth as needed (swelling). (Patient not taking: Reported on 01/04/2019) 30 capsule 3   No current facility-administered medications for this visit.      Past Medical History:  Diagnosis Date  . Anxiety    takes Xanax daily as needed  . Arthritis   . Cataract    removed both eyes  . Depression    takes Celexa daily  . Diverticulitis   . Diverticulosis   . Enlarged prostate    sightly  . Esophagitis   . Gastric ulcer   . GERD (gastroesophageal reflux disease)    takes Protonix daily  . Hx of adenomatous colonic polyps    benign  . Hyperlipidemia    takes Zocor daily  . Hypertension   . Insomnia    takes Ambien nightly  . Joint pain   . Joint swelling     ROS:   All systems reviewed and negative except as noted in the HPI.   Past Surgical History:  Procedure Laterality Date  . CARDIAC CATHETERIZATION    .  CARDIOVERSION N/A 11/20/2018   Procedure: CARDIOVERSION;  Surgeon: Troy Sine, MD;  Location: [facility] ENDOSCOPY;  Service: Cardiovascular;  Laterality: N/A;  . cartliage removed from nose  27yrs ago  . cataract surgery Bilateral   . CERVICAL SPINE SURGERY    . COLONOSCOPY    . CORONARY ARTERY BYPASS GRAFT N/A 11/16/2017   Procedure: CORONARY ARTERY BYPASS GRAFTING (CABG) x4. LEFT ENDOSCOPIC SAPHENOUS VEIN HARVEST AND MAMMARY ARTERY TAKE DOWN. LIMA TO LAD, SVG TO PDA, SVG TO DISTAL CIRC & OMI.;  Surgeon: [name], MD;  Location: [facility];  Service: Open Heart Surgery;  Laterality: N/A;  . FOREIGN BODY REMOVAL ESOPHAGEAL    . KNEE SURGERY Left    couple of times  . LEFT HEART CATH AND CORONARY ANGIOGRAPHY N/A 11/15/2017   Procedure: LEFT HEART CATH AND CORONARY ANGIOGRAPHY;  Surgeon: Troy Sine, MD;  Location: [facility]  CV LAB;  Service: Cardiovascular;  Laterality: N/A;  . NASAL SINUS SURGERY    . POLYPECTOMY    . TEE WITHOUT CARDIOVERSION N/A 11/16/2017   Procedure: TRANSESOPHAGEAL ECHOCARDIOGRAM (TEE);  Surgeon: [name], MD;  Location: [facility];  Service: Open Heart Surgery;  Laterality: N/A;  . TONSILLECTOMY    . TOTAL KNEE ARTHROPLASTY Left 06/17/2014   Procedure: TOTAL KNEE ARTHROPLASTY;  Surgeon: Garald Balding, MD;  Location: [facility];  Service: Orthopedics;  Laterality: Left;     Family History  Problem Relation Age of Onset  . Colon cancer Paternal Grandfather        questionable  . Colon polyps Neg Hx   . Esophageal cancer Neg Hx   . Rectal cancer Neg Hx   . Stomach cancer Neg Hx      Social History   Socioeconomic History  . Marital status: Single    Spouse name: Not on file  . Number of children: 3  . Years of education: Not on file  . Highest education level: Not on file  Occupational History  . Occupation: Financial controller  Social Needs  . Financial resource strain: Not very hard  . Food insecurity    Worry: Never true    Inability: Never true  . Transportation needs    Medical: No    Non-medical: No  Tobacco Use  . Smoking status: Never Smoker  . Smokeless tobacco: Never Used  Substance and Sexual Activity  . Alcohol use: Yes    Alcohol/week: 10.0 standard drinks    Types: 10 Standard drinks or equivalent per week    Comment: 2 drinks 5 nights per week  . Drug use: No  . Sexual activity: Never  Lifestyle  . Physical activity    Days per week: 4 days    Minutes per session: 30 min  . Stress: Only a little  Relationships  . Social Herbalist on phone: Not on file    Gets together: Not on file    Attends religious service: Not on file    Active member of club or organization: Not on file    Attends meetings of clubs or organizations: Not on file    Relationship status: Not on file  . Intimate partner violence    Fear of current or ex partner: Not on  file    Emotionally abused: Not on file    Physically abused: Not on file    Forced sexual activity: Not on file  Other Topics Concern  . Not on file  Social History Narrative  .  Not on file     BP 128/80   Pulse (!) 59   Ht 5\' 9"  (1.753 m)   Wt 207 lb 9.6 oz (94.2 kg)   SpO2 98%   BMI 30.66 kg/m   Physical Exam:  Well appearing NAD HEENT: Unremarkable Neck:  No JVD, no thyromegally Lymphatics:  No adenopathy Back:  No CVA tenderness Lungs:  Clear with no wheezes HEART:  IRegular tachy rhythm, no murmurs, no rubs, no clicks Abd:  soft, positive bowel sounds, no organomegally, no rebound, no guarding Ext:  2 plus pulses, no edema, no cyanosis, no clubbing Skin:  No rashes no nodules Neuro:  CN II through XII intact, motor grossly intact  EKG - atrial flutter with a RVR   Assess/Plan: 1. Atrial flutter - He has had recurrent flutter which appears to be typical despite medical therapy. I have discussed the indications/risks/benefits/goals/expectations of EP study and catheter ablation of atrial flutter and he wishes to proceed. He will hold his Eliquis the morning of the ablation.  2. CAD - he is asymptomatic, s/p CABG despite rapid rates.   Carl [name].D.

## 2019-01-10 NOTE — Patient Instructions (Addendum)
Medication Instructions:  Your physician recommends that you continue on your current medications as directed. Please refer to the Current Medication list given to you today.  Labwork: You will get lab work today:  BMP and CBC  Testing/Procedures: Your physician has recommended that you have an ablation. Catheter ablation is a medical procedure used to treat some cardiac arrhythmias (irregular heartbeats). During catheter ablation, a long, thin, flexible tube is put into a blood vessel in your groin (upper thigh), or neck. This tube is called an ablation catheter. It is then guided to your heart through the blood vessel. Radio frequency waves destroy small areas of heart tissue where abnormal heartbeats may cause an arrhythmia to start. Please see the instruction sheet given to you today.   Follow-Up:  SEE INSTRUCTION letter.  Any Other Special Instructions Will Be Listed Below (If Applicable).  If you need a refill on your cardiac medications before your next appointment, please call your pharmacy.    Cardiac Ablation Cardiac ablation is a procedure to disable (ablate) a small amount of heart tissue in very specific places. The heart has many electrical connections. Sometimes these connections are abnormal and can cause the heart to beat very fast or irregularly. Ablating some of the problem areas can improve the heart rhythm or return it to normal. Ablation may be done for people who:  Have Wolff-Parkinson-White syndrome.  Have fast heart rhythms (tachycardia).  Have taken medicines for an abnormal heart rhythm (arrhythmia) that were not effective or caused side effects.  Have a high-risk heartbeat that may be life-threatening. During the procedure, a small incision is made in the neck or the groin, and a long, thin, flexible tube (catheter) is inserted into the incision and moved to the heart. Small devices (electrodes) on the tip of the catheter will send out electrical currents. A  type of X-ray (fluoroscopy) will be used to help guide the catheter and to provide images of the heart. Tell a health care provider about:  Any allergies you have.  All medicines you are taking, including vitamins, herbs, eye drops, creams, and over-the-counter medicines.  Any problems you or family members have had with anesthetic medicines.  Any blood disorders you have.  Any surgeries you have had.  Any medical conditions you have, such as kidney failure.  Whether you are pregnant or may be pregnant. What are the risks? Generally, this is a safe procedure. However, problems may occur, including:  Infection.  Bruising and bleeding at the catheter insertion site.  Bleeding into the chest, especially into the sac that surrounds the heart. This is a serious complication.  Stroke or blood clots.  Damage to other structures or organs.  Allergic reaction to medicines or dyes.  Need for a permanent pacemaker if the normal electrical system is damaged. A pacemaker is a small computer that sends electrical signals to the heart and helps your heart beat normally.  The procedure not being fully effective. This may not be recognized until months later. Repeat ablation procedures are sometimes required. What happens before the procedure?  Follow instructions from your health care provider about eating or drinking restrictions.  Ask your health care provider about: ? Changing or stopping your regular medicines. This is especially important if you are taking diabetes medicines or blood thinners. ? Taking medicines such as aspirin and ibuprofen. These medicines can thin your blood. Do not take these medicines before your procedure if your health care provider instructs you not to.  Plan to  have someone take you home from the hospital or clinic.  If you will be going home right after the procedure, plan to have someone with you for 24 hours. What happens during the procedure?  To  lower your risk of infection: ? Your health care team will wash or sanitize their hands. ? Your skin will be washed with soap. ? Hair may be removed from the incision area.  An IV tube will be inserted into one of your veins.  You will be given a medicine to help you relax (sedative).  The skin on your neck or groin will be numbed.  An incision will be made in your neck or your groin.  A needle will be inserted through the incision and into a large vein in your neck or groin.  A catheter will be inserted into the needle and moved to your heart.  Dye may be injected through the catheter to help your surgeon see the area of the heart that needs treatment.  Electrical currents will be sent from the catheter to ablate heart tissue in desired areas. There are three types of energy that may be used to ablate heart tissue: ? Heat (radiofrequency energy). ? Laser energy. ? Extreme cold (cryoablation).  When the necessary tissue has been ablated, the catheter will be removed.  Pressure will be held on the catheter insertion area to prevent excessive bleeding.  A bandage (dressing) will be placed over the catheter insertion area. The procedure may vary among health care providers and hospitals. What happens after the procedure?  Your blood pressure, heart rate, breathing rate, and blood oxygen level will be monitored until the medicines you were given have worn off.  Your catheter insertion area will be monitored for bleeding. You will need to lie still for a few hours to ensure that you do not bleed from the catheter insertion area.  Do not drive for 24 hours or as long as directed by your health care provider. Summary  Cardiac ablation is a procedure to disable (ablate) a small amount of heart tissue in very specific places. Ablating some of the problem areas can improve the heart rhythm or return it to normal.  During the procedure, electrical currents will be sent from the catheter  to ablate heart tissue in desired areas. This information is not intended to replace advice given to you by your health care provider. Make sure you discuss any questions you have with your health care provider. Document Released: 06/12/2008 Document Revised: 07/17/2017 Document Reviewed: 12/14/2015 Elsevier Patient Education  2020 [employer].

## 2019-01-10 NOTE — Progress Notes (Signed)
HPI [name] is referred today by [name] for evaluation of atrial flutter. He is a pleasant 73 yo man with a h/o CAD, HTN, s/p CABG. He subsequently developed atrial flutter and was cardioverted but has had recurrent atrial flutter and is referred to consider catheter ablation. He is asymptomatic. His rate has been difficult to control despite high dose beta blocker therapy. The patient has not had syncope and denies chest pain or sob. He has been taking his anti-coagulation.  No Known Allergies   Current Outpatient Medications  Medication Sig Dispense Refill  . acetaminophen (TYLENOL) 500 MG tablet Take 1,000 mg by mouth every 6 (six) hours as needed for moderate pain or headache.    Marland Kitchen acyclovir (ZOVIRAX) 400 MG tablet Take 400 mg by mouth 2 (two) times daily.    Marland Kitchen aspirin EC 81 MG tablet Take 1 tablet (81 mg total) by mouth daily. (Patient taking differently: Take 81 mg by mouth at bedtime. ) 90 tablet 3  . citalopram (CELEXA) 20 MG tablet Take 20 mg by mouth daily.    . ferrous sulfate 325 (65 FE) MG tablet Take 325 mg by mouth daily.    . Glucosamine HCl (GLUCOSAMINE PO) Take 1 tablet by mouth 2 (two) times daily.    Vanessa Kick Ethyl (VASCEPA) 1 g CAPS Take 2 capsules (2 g total) by mouth 2 (two) times daily. 360 capsule 1  . levothyroxine (SYNTHROID) 150 MCG tablet Take 1 tablet (150 mcg total) by mouth daily. (Patient taking differently: Take 150 mcg by mouth at bedtime. ) 90 tablet 1  . metoprolol tartrate (LOPRESSOR) 100 MG tablet Take 1 tablet (100 mg total) by mouth 2 (two) times daily. 180 tablet 3  . Multiple Vitamins-Minerals (PRESERVISION AREDS 2 PO) Take 1 tablet by mouth 2 (two) times daily.    . pantoprazole (PROTONIX) 20 MG tablet TAKE 2 TABLETS(40 MG) BY MOUTH DAILY (Patient taking differently: Take 40 mg by mouth at bedtime. ) 180 tablet 3  . rosuvastatin (CRESTOR) 20 MG tablet Take 1 tablet (20 mg total) by mouth daily. 90 tablet 2  . zolpidem (AMBIEN) 10 MG  tablet Take 10 mg by mouth at bedtime.     Marland Kitchen apixaban (ELIQUIS) 5 MG TABS tablet Take 1 tablet (5 mg total) by mouth 2 (two) times daily. 180 tablet 1  . hydrochlorothiazide (MICROZIDE) 12.5 MG capsule Take 1 capsule (12.5 mg total) by mouth as needed (swelling). (Patient not taking: Reported on 01/04/2019) 30 capsule 3   No current facility-administered medications for this visit.      Past Medical History:  Diagnosis Date  . Anxiety    takes Xanax daily as needed  . Arthritis   . Cataract    removed both eyes  . Depression    takes Celexa daily  . Diverticulitis   . Diverticulosis   . Enlarged prostate    sightly  . Esophagitis   . Gastric ulcer   . GERD (gastroesophageal reflux disease)    takes Protonix daily  . Hx of adenomatous colonic polyps    benign  . Hyperlipidemia    takes Zocor daily  . Hypertension   . Insomnia    takes Ambien nightly  . Joint pain   . Joint swelling     ROS:   All systems reviewed and negative except as noted in the HPI.   Past Surgical History:  Procedure Laterality Date  . CARDIAC CATHETERIZATION    .  CARDIOVERSION N/A 11/20/2018   Procedure: CARDIOVERSION;  Surgeon: Troy Sine, MD;  Location: [facility];  Service: Cardiovascular;  Laterality: N/A;  . cartliage removed from nose  48yrs ago  . cataract surgery Bilateral   . CERVICAL SPINE SURGERY    . COLONOSCOPY    . CORONARY ARTERY BYPASS GRAFT N/A 11/16/2017   Procedure: CORONARY ARTERY BYPASS GRAFTING (CABG) x4. LEFT ENDOSCOPIC SAPHENOUS VEIN HARVEST AND MAMMARY ARTERY TAKE DOWN. LIMA TO LAD, SVG TO PDA, SVG TO DISTAL CIRC & OMI.;  Surgeon: [name], MD;  Location: [facility];  Service: Open Heart Surgery;  Laterality: N/A;  . FOREIGN BODY REMOVAL ESOPHAGEAL    . KNEE SURGERY Left    couple of times  . LEFT HEART CATH AND CORONARY ANGIOGRAPHY N/A 11/15/2017   Procedure: LEFT HEART CATH AND CORONARY ANGIOGRAPHY;  Surgeon: Troy Sine, MD;  Location: [facility];  Service: Cardiovascular;  Laterality: N/A;  . NASAL SINUS SURGERY    . POLYPECTOMY    . TEE WITHOUT CARDIOVERSION N/A 11/16/2017   Procedure: TRANSESOPHAGEAL ECHOCARDIOGRAM (TEE);  Surgeon: [name], MD;  Location: [place];  Service: Open Heart Surgery;  Laterality: N/A;  . TONSILLECTOMY    . TOTAL KNEE ARTHROPLASTY Left 06/17/2014   Procedure: TOTAL KNEE ARTHROPLASTY;  Surgeon: Garald Balding, MD;  Location: [place];  Service: Orthopedics;  Laterality: Left;     Family History  Problem Relation Age of Onset  . Colon cancer Paternal Grandfather        questionable  . Colon polyps Neg Hx   . Esophageal cancer Neg Hx   . Rectal cancer Neg Hx   . Stomach cancer Neg Hx      Social History   Socioeconomic History  . Marital status: Single    Spouse name: Not on file  . Number of children: 3  . Years of education: Not on file  . Highest education level: Not on file  Occupational History  . Occupation: Financial controller  Social Needs  . Financial resource strain: Not very hard  . Food insecurity    Worry: Never true    Inability: Never true  . Transportation needs    Medical: No    Non-medical: No  Tobacco Use  . Smoking status: Never Smoker  . Smokeless tobacco: Never Used  Substance and Sexual Activity  . Alcohol use: Yes    Alcohol/week: 10.0 standard drinks    Types: 10 Standard drinks or equivalent per week    Comment: 2 drinks 5 nights per week  . Drug use: No  . Sexual activity: Never  Lifestyle  . Physical activity    Days per week: 4 days    Minutes per session: 30 min  . Stress: Only a little  Relationships  . Social Herbalist on phone: Not on file    Gets together: Not on file    Attends religious service: Not on file    Active member of club or organization: Not on file    Attends meetings of clubs or organizations: Not on file    Relationship status: Not on file  . Intimate partner violence    Fear of current or ex partner: Not on  file    Emotionally abused: Not on file    Physically abused: Not on file    Forced sexual activity: Not on file  Other Topics Concern  . Not on file  Social History Narrative  .  Not on file     BP 128/80   Pulse (!) 59   Ht 5\' 9"  (1.753 m)   Wt 207 lb 9.6 oz (94.2 kg)   SpO2 98%   BMI 30.66 kg/m   Physical Exam:  Well appearing NAD HEENT: Unremarkable Neck:  No JVD, no thyromegally Lymphatics:  No adenopathy Back:  No CVA tenderness Lungs:  Clear with no wheezes HEART:  IRegular tachy rhythm, no murmurs, no rubs, no clicks Abd:  soft, positive bowel sounds, no organomegally, no rebound, no guarding Ext:  2 plus pulses, no edema, no cyanosis, no clubbing Skin:  No rashes no nodules Neuro:  CN II through XII intact, motor grossly intact  EKG - atrial flutter with a RVR   Assess/Plan: 1. Atrial flutter - He has had recurrent flutter which appears to be typical despite medical therapy. I have discussed the indications/risks/benefits/goals/expectations of EP study and catheter ablation of atrial flutter and he wishes to proceed. He will hold his Eliquis the morning of the ablation.  2. CAD - he is asymptomatic, s/p CABG despite rapid rates.   Mikle Bosworth.D.

## 2019-01-11 ENCOUNTER — Other Ambulatory Visit: Payer: Self-pay

## 2019-01-11 ENCOUNTER — Encounter (HOSPITAL_COMMUNITY)
Admission: RE | Disposition: A | Discharge status: Home / Self Care | Payer: Self-pay | Source: Home / Self Care | Attending: Internal Medicine

## 2019-01-11 ENCOUNTER — Ambulatory Visit (HOSPITAL_COMMUNITY)
Admission: RE | Admit: 2019-01-11 | Discharge: 2019-01-11 | Disposition: A | Discharge status: Home / Self Care | Payer: PPO | Attending: Internal Medicine | Admitting: Internal Medicine

## 2019-01-11 DIAGNOSIS — M1990 Unspecified osteoarthritis, unspecified site: Secondary | ICD-10-CM | POA: Diagnosis not present

## 2019-01-11 DIAGNOSIS — I2510 Atherosclerotic heart disease of native coronary artery without angina pectoris: Secondary | ICD-10-CM | POA: Insufficient documentation

## 2019-01-11 DIAGNOSIS — Z7982 Long term (current) use of aspirin: Secondary | ICD-10-CM | POA: Diagnosis not present

## 2019-01-11 DIAGNOSIS — G4700 Insomnia, unspecified: Secondary | ICD-10-CM | POA: Insufficient documentation

## 2019-01-11 DIAGNOSIS — Z86010 Personal history of colonic polyps: Secondary | ICD-10-CM | POA: Diagnosis not present

## 2019-01-11 DIAGNOSIS — F329 Major depressive disorder, single episode, unspecified: Secondary | ICD-10-CM | POA: Diagnosis not present

## 2019-01-11 DIAGNOSIS — Z951 Presence of aortocoronary bypass graft: Secondary | ICD-10-CM | POA: Insufficient documentation

## 2019-01-11 DIAGNOSIS — E785 Hyperlipidemia, unspecified: Secondary | ICD-10-CM | POA: Diagnosis not present

## 2019-01-11 DIAGNOSIS — I483 Typical atrial flutter: Secondary | ICD-10-CM | POA: Diagnosis not present

## 2019-01-11 DIAGNOSIS — K219 Gastro-esophageal reflux disease without esophagitis: Secondary | ICD-10-CM | POA: Insufficient documentation

## 2019-01-11 DIAGNOSIS — Z7901 Long term (current) use of anticoagulants: Secondary | ICD-10-CM | POA: Insufficient documentation

## 2019-01-11 DIAGNOSIS — N400 Enlarged prostate without lower urinary tract symptoms: Secondary | ICD-10-CM | POA: Diagnosis not present

## 2019-01-11 DIAGNOSIS — I10 Essential (primary) hypertension: Secondary | ICD-10-CM | POA: Diagnosis not present

## 2019-01-11 DIAGNOSIS — F419 Anxiety disorder, unspecified: Secondary | ICD-10-CM | POA: Insufficient documentation

## 2019-01-11 DIAGNOSIS — Z79890 Hormone replacement therapy (postmenopausal): Secondary | ICD-10-CM | POA: Diagnosis not present

## 2019-01-11 HISTORY — PX: A-FLUTTER ABLATION: EP1230

## 2019-01-11 SURGERY — A-FLUTTER ABLATION

## 2019-01-11 MED ORDER — MIDAZOLAM HCL 5 MG/5ML IJ SOLN
INTRAMUSCULAR | Status: DC | PRN
  Administered 2019-01-11 (×4): 1 mg via INTRAVENOUS
  Administered 2019-01-11 (×2): 2 mg via INTRAVENOUS

## 2019-01-11 MED ORDER — ONDANSETRON HCL 4 MG/2ML IJ SOLN: 4.0000 mg | Freq: Four times a day (QID) | INTRAMUSCULAR | Status: DC | PRN

## 2019-01-11 MED ORDER — SODIUM CHLORIDE 0.9 % IV SOLN
INTRAVENOUS | Status: DC
  Administered 2019-01-11: 10:00:00 via INTRAVENOUS

## 2019-01-11 MED ORDER — HEPARIN (PORCINE) IN NACL 1000-0.9 UT/500ML-% IV SOLN
INTRAVENOUS | Status: DC | PRN
  Administered 2019-01-11: 500 mL

## 2019-01-11 MED ORDER — FENTANYL CITRATE (PF) 100 MCG/2ML IJ SOLN
INTRAMUSCULAR | Status: AC
  Filled 2019-01-11: qty 2

## 2019-01-11 MED ORDER — HEPARIN SODIUM (PORCINE) 1000 UNIT/ML IJ SOLN
INTRAMUSCULAR | Status: AC
  Filled 2019-01-11: qty 1

## 2019-01-11 MED ORDER — ACETAMINOPHEN 325 MG PO TABS
650.0000 mg | ORAL_TABLET | ORAL | Status: DC | PRN
  Filled 2019-01-11: qty 2

## 2019-01-11 MED ORDER — BUPIVACAINE HCL (PF) 0.25 % IJ SOLN
INTRAMUSCULAR | Status: AC
  Filled 2019-01-11: qty 30

## 2019-01-11 MED ORDER — BUPIVACAINE HCL (PF) 0.25 % IJ SOLN
INTRAMUSCULAR | Status: DC | PRN
  Administered 2019-01-11: 50 mL

## 2019-01-11 MED ORDER — SODIUM CHLORIDE 0.9 % IV SOLN: 250.0000 mL | INTRAVENOUS | Status: DC | PRN

## 2019-01-11 MED ORDER — SODIUM CHLORIDE 0.9% FLUSH: 3.0000 mL | INTRAVENOUS | Status: DC | PRN

## 2019-01-11 MED ORDER — MIDAZOLAM HCL 5 MG/5ML IJ SOLN
INTRAMUSCULAR | Status: AC
  Filled 2019-01-11: qty 5

## 2019-01-11 MED ORDER — SODIUM CHLORIDE 0.9% FLUSH: 3.0000 mL | Freq: Two times a day (BID) | INTRAVENOUS | Status: DC

## 2019-01-11 MED ORDER — HEPARIN (PORCINE) IN NACL 1000-0.9 UT/500ML-% IV SOLN
INTRAVENOUS | Status: AC
  Filled 2019-01-11: qty 500

## 2019-01-11 MED ORDER — HEPARIN SODIUM (PORCINE) 1000 UNIT/ML IJ SOLN
INTRAMUSCULAR | Status: DC | PRN
  Administered 2019-01-11: 1000 [IU] via INTRAVENOUS

## 2019-01-11 MED ORDER — FENTANYL CITRATE (PF) 100 MCG/2ML IJ SOLN
INTRAMUSCULAR | Status: DC | PRN
  Administered 2019-01-11 (×5): 25 ug via INTRAVENOUS

## 2019-01-11 SURGICAL SUPPLY — 13 items
BAG SNAP BAND KOVER 36X36 (MISCELLANEOUS) ×1 IMPLANT
CATH JOSEPH QUAD ALLRED 6F REP (CATHETERS) ×1 IMPLANT
CATH NAVISTAR SMARTTOUCH FJ (ABLATOR) ×1 IMPLANT
CATH WEBSTER BI DIR CS D-F CRV (CATHETERS) ×1 IMPLANT
PACK EP LATEX FREE (CUSTOM PROCEDURE TRAY) ×2
PACK EP LF (CUSTOM PROCEDURE TRAY) ×1 IMPLANT
PAD PRO RADIOLUCENT 2001M-C (PAD) ×2 IMPLANT
PATCH CARTO3 (PAD) ×1 IMPLANT
SHEATH PINNACLE 6F 10CM (SHEATH) ×1 IMPLANT
SHEATH PINNACLE 7F 10CM (SHEATH) ×1 IMPLANT
SHEATH PINNACLE 8F 10CM (SHEATH) ×2 IMPLANT
SHIELD RADPAD SCOOP 12X17 (MISCELLANEOUS) ×1 IMPLANT
TUBING SMART ABLATE COOLFLOW (TUBING) ×1 IMPLANT

## 2019-01-11 NOTE — Progress Notes (Signed)
Site area: rt groin fv sheaths x3 pulled by Caren Griffins Site Prior to Removal:  Level 0 Pressure Applied For: 25 minutes Manual:   yes Patient Status During Pull:  stable Post Pull Site:  Level 0 Post Pull Instructions Given:  yes Post Pull Pulses Present: rt dp palpable Dressing Applied:  Gauze and tegaderm Bedrest begins @ 1315 Comments:  IV saline locked

## 2019-01-11 NOTE — Interval H&P Note (Signed)
History and Physical Interval Note:  01/11/2019 10:22 AM  Carl [name]  has presented today for surgery, with the diagnosis of aflutter.  The various methods of treatment have been discussed with the patient and family. After consideration of risks, benefits and other options for treatment, the patient has consented to  Procedure(s): A-FLUTTER ABLATION (N/A) as a surgical intervention.  The patient's history has been reviewed, patient examined, no change in status, stable for surgery.  I have reviewed the patient's chart and labs.  Questions were answered to the patient's satisfaction.     Cristopher Peru

## 2019-01-11 NOTE — Progress Notes (Signed)
Site area: rt IJ venous sheath Site Prior to Removal:  Level 0 Pressure Applied For:  15 minutes Manual:   yes Patient Status During Pull:  stable Post Pull Site:  Level  0 Post Pull Instructions Given:  yes Post Pull Pulses Present: NA  Dressing Applied:  Petrolatum gauze, gauze and tegaderm Bedrest begins @  Comments:

## 2019-01-11 NOTE — Discharge Instructions (Signed)

## 2019-01-11 NOTE — Progress Notes (Signed)
No bleeding or swelling noted after ambulation 

## 2019-01-14 ENCOUNTER — Encounter (HOSPITAL_COMMUNITY): Payer: Self-pay | Admitting: Internal Medicine

## 2019-01-15 ENCOUNTER — Telehealth: Payer: Self-pay | Admitting: Cardiovascular Disease

## 2019-01-15 NOTE — Telephone Encounter (Signed)
Pt c/o Shortness Of Breath: STAT if SOB developed within the last 24 hours or pt is noticeably SOB on the phone  1. Are you currently SOB (can you hear that pt is SOB on the phone)? No  2. How long have you been experiencing SOB? A couple days ago   3. Are you SOB when sitting or when up moving around? Moving around   4. Are you currently experiencing any other symptoms? No  Patient is calling stating he has been experiencing SOB since his procedure on 01/11/19 and is wanting to know if that is normal. Please Advise.

## 2019-01-15 NOTE — Telephone Encounter (Signed)
I spoke with patient.  He is feeling well.  His resting pulse now typically has been in the upper 50s.  He is not aware of any recurrent a flutter or A. fib.  There is just mild shortness of breath with walking longer distances.  At this point do not need to add onto my schedule.  But keep appointment to see Dr. Lovena Le which Dr. Lovena Le he told me he was planning to see him 2 weeks after his procedure.  We will see the patient thereafter and he will also have a sleep study done hopefully this month which had been rescheduled.

## 2019-01-15 NOTE — Telephone Encounter (Signed)
MD is aware of patient's concerns and will contact patient. May add on to 12/9 AM clinic schedule or 12/10 PM @ 2:20pm

## 2019-01-15 NOTE — Telephone Encounter (Signed)
DO YOU WANT TO SQUEEZE IN [facility] [place]?

## 2019-01-16 ENCOUNTER — Ambulatory Visit: Payer: PPO | Admitting: Cardiovascular Disease

## 2019-01-16 ENCOUNTER — Encounter: Payer: Self-pay | Admitting: Cardiovascular Disease

## 2019-01-16 ENCOUNTER — Other Ambulatory Visit: Payer: Self-pay

## 2019-01-16 DIAGNOSIS — Z7901 Long term (current) use of anticoagulants: Secondary | ICD-10-CM

## 2019-01-16 DIAGNOSIS — E039 Hypothyroidism, unspecified: Secondary | ICD-10-CM

## 2019-01-16 DIAGNOSIS — G4733 Obstructive sleep apnea (adult) (pediatric): Secondary | ICD-10-CM

## 2019-01-16 DIAGNOSIS — R0602 Shortness of breath: Secondary | ICD-10-CM

## 2019-01-16 DIAGNOSIS — Z951 Presence of aortocoronary bypass graft: Secondary | ICD-10-CM | POA: Diagnosis not present

## 2019-01-16 DIAGNOSIS — I2510 Atherosclerotic heart disease of native coronary artery without angina pectoris: Secondary | ICD-10-CM | POA: Diagnosis not present

## 2019-01-16 DIAGNOSIS — I483 Typical atrial flutter: Secondary | ICD-10-CM

## 2019-01-16 DIAGNOSIS — Z8679 Personal history of other diseases of the circulatory system: Secondary | ICD-10-CM | POA: Diagnosis not present

## 2019-01-16 MED ORDER — METOPROLOL TARTRATE 50 MG PO TABS: 75.0000 mg | ORAL_TABLET | Freq: Two times a day (BID) | ORAL | 3 refills | Status: DC

## 2019-01-16 NOTE — Progress Notes (Signed)
Cardiology Office Note    Date:  01/16/2019   ID:  Carl [name], DOB 03-14-1945, MRN [id number]  PCP:  Hulan Fess, MD  Cardiologist:  Shelva Majestic, MD    History of Present Illness:  Carl [name] is a 73 y.o. male who presents to the office today for a  follow-up evaluation of his recurrent atrial flutter.  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  In 2009 he developed transient numbness of his right face which lasted for several minutes and at that time apparently underwent evaluation and had a normal MRI, MRA, and carotid duplex studies.  He was found to have only mild to moderate plaque in the proximal right internal carotid artery without stenosis.  I had seen him in March 2016 for preoperative cardiology clearance prior to undergoing left knee replacement surgery by Dr. Durward Fortes.  Preoperative ECG suggested possible junctional rhythm which was new from an ECG of 2012 which previously had only shown sinus bradycardia.  He was asymptomatic.  When I saw him, his ECG showed sinus rhythm at 61 bpm with normal intervals and on that ECG P waves were normal and upright inferiorly.  Carl [name] has remained fairly active. In 2019  he had not been exercising as much as he had in the past still working out with a trainer and denied any associated chest pain or palpitations.  He in September 201 19 he began to notice more shortness of breath with walking up steps.  He was  at a friend's house and was told that he appeared to be more short of breath than previously.  I saw him for evaluation of his exertional shortness of breath on October 24, 2017.  At that time, I recommended that he undergo a 2D echo Doppler study and scheduled him for coronary CT angiography.  His echo Doppler study demonstrated hyperdynamic LV function with an EF of 65 to 70%.  Doppler parameters suggest grade 2 diastolic dysfunction and elevated ventricular  end-diastolic filling pressure.  He had mild MR, mild TR, and mild dilation of his left atrium.  Pulmonary pressures were normal.  CT coronary angiography was performed on November 09, 2017.  This was abnormal and demonstrated an elevated calcium score a 25 which is 78th percentile for age and sex.  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  There was greater than 75% calcific plaque in his proximal and mid diagonal 1 vessel.  The circumflex had 50% proximal plaque.  There was 50 to 75% mixed plaque in the OM 2 vessel less than 50% in the OM1 vessel.  His RCA had 50% calcified plaque proximally, 50 to 75% calcified plaque in the mid vessel.  There was mild aortic root dilatation at 4.1 cm.  His CT images were referred for [facility] analysis which were positive in the mid RCA 0.78, the mid LAD 0.78 and in the AV groove circumflex at 0.71.   I saw him in follow-up of the above studies and recommended cardiac catheterization.  Catheterization was done in November 15, 2017 which showed severe multivessel CAD with 85% ostial LAD stenosis, diffuse 75% mid LAD stenosis, total occlusion of the mid AV groove circumflex after the takeoff of the second obtuse marginal vessel with 50% diffuse stenosis in the first large marginal branch, 80 and 90%  stenoses in the second obtuse marginal vessel and extensive collateralization to the distal circumflex and third marginal via the LAD.  He had 1650% mid RCA stenoses and a dominant RCA.  Of note, he had probable high right radial artery takeoff with spasm/coarse stenosis above the elbow and there was retrograde filling of the brachial artery and the catheterization was transition to the femoral approach.  He underwent successful CABG revascularization surgery the following day by Dr. Roxy Horseman on November 16, 2017 with a LIMA graft to his LAD, SVG to first marginal and distal  circumflex, and SVG to the PDA of his RCA with left thigh and calf endoscopic vein harvest.  He developed atrial fibrillation with RVR on postop day 2 and was started on IV amiodarone.  He ultimately converted back to sinus rhythm but developed recurrent AF on postop day 4 on metoprolol and amiodarone and ultimately converted back to sinus rhythm.  He was started on Eliquis on day 5 for anticoagulation.  He was discharged on November 22, 2017.  Approximately 10 days later he started to develop left lower extremity swelling and redness with pain in the back of his calf.  He was evaluated by Ellwood Handler, PA-C at [facility] on December 04, 2017.  There was no sign of infection.  Venous duplex imaging did not reveal any DVT.  He saw Dr. Servando Snare for initial follow-up evaluation on December 14, 2017 at which time he was maintaining sinus rhythm.  He will be participating in cardiac rehab and his orientation is scheduled for February 15, 2018.  He denied any recurrent chest pain.  His breathing has improved with walking.  He went to the beach and was walking at least a mile per day.  He is sleeping better and snoring is less.    Since his November 2019 and prior to his February 2020 evaluation he was doing well and was  participating in cardiac rehabilitation and also had joined a gym. He was exercising regularly.  He denied chest pain, PND, or orthopnea.  Subsequent laboratory showed further TSH elevation.  His levothyroxine was increased to 75 mcg.  Amiodarone was discontinued due to LFT elevation and his dose of rosuvastatin was reduced to one half of the current dose.  I  saw him in February 2020 at that time he had not had follow-up laboratory.    He had had follow-up laboratory which continued to show hypothyroidism and his dose of levothyroxine has gradually been increased.  Recent laboratory done by Dr. Rex Kras several months ago continues to show a TSH was elevated and his levothyroxine dose was increased from 100 to 125  mcg.  During the COVID-19 pandemic, he admits to weight gain.  He has not been exercising as much as he had in the past.  Although he felt well, when recently seen by Dr. Durward Fortes he was felt to potentially be more short of breath.  He was advised to see me back for follow-up evaluation.  I scheduled him to undergo repeat laboratory which was done on October 05, 2018.  Hemoglobin 15.4, hematocrit 44.2.  Lipid studies were significantly improved from 6 months previously with total cholesterol now 143, triglycerides 142, HDL 48, LDL 67.  Brain natruretic peptide was 131.  TSH had significantly improved but was still elevated at 15.9.  He had normal renal function with a BUN of 15 creatinine 0.96.  LFTs were significantly improved but minimally increased with an AST of 46 and ALT at 49.  He  denied any chest pain or awareness of palpitations.  I saw him for reevaluation on October 08, 2018.  During that evaluation, I reviewed his laboratory and felt he was euvolemic.  His ECG showed normal sinus rhythm at 60 bpm with an isolated PVC.  His QTc interval was 434 ms.  With his continued TSH elevation, levothyroxine was further titrated to 150 mcg.  Subsequently, Sohaib has continued to feel well.  He denied any awareness of palpitations.  Last week, in October 29, 2018 when checking his Apple watch he saw report that his heart rate was increased in the upper 130s.  He was unaware of this irregularity.  Upon further questioning, he had to go out of town over the last several days and believes he may have missed taking his morning metoprolol dose on Friday and Saturday and he could not remember if he had taken it yesterday morning.  He was supposed to be taken metoprolol tartrate 25 mg twice a day, HCTZ on a as needed basis which he has not needed.  He continues to be on Eliquis 5 mg twice a day and is tolerated the levothyroxine 150 mcg dose.  He is now on the vascepa which was just renewed last week 2 capsules twice a day,  rosuvastatin 20 mg daily for mixed hyperlipidemia.  After receiving his phone call, I advised that he come to the office to check an EKG.  With his ECG showing atrial flutter at a rate of 130 he was added onto my schedule for further evaluation.  During that evaluation, he was asymptomatic and felt well.  His ECG showed atrial flutter with variable block at 130 bpm with nonspecific ST-T changes.  He was on Eliquis for anticoagulation.  I recommended titration of metoprolol tartrate to 50 mg twice a day.  Laboratory drawn that day revealed a TSH 5.95, magnesium 2.3, potassium 4.7, BUN 14 creatinine 0.9.   I saw him on September 28 for follow-up evaluation.  He was feeling well and continue to be unaware of his heart rate irregularity.  However according to his Apple Watch, his heart rate was running in the 115-120 range   He denied chest pain.  He denied tremors.  He was sleeping well and denied any awareness of sleep apnea.  During that evaluation we discussed gradually him for DC cardioversion but this would require COVID-19 testing with quarantine for 3 days prior to having the procedure done.  His daughter was coming into town that weekend.  As result we further titrated his metoprolol to 75 mg twice a day which he did for 4 days and then increase to 200 mg twice a day.  He continues to be on Eliquis for anticoagulation.  He admits to increased fatigue but denies chest pain.   I saw Arael in follow-up on November 15, 2018.  At that time he had titrated the metoprolol up to 100 mg twice a day.  He underwent successful DC cardioversion on November 20, 2018 with restoration of sinus rhythm.  Of note, he developed hypotension prior to awakening from propofol and received normal saline and Neo-Synephrine.  Blood pressure normalized loud snoring was noted, and it was advised to consider sleep study evaluation.  Over the month after his cardioversion Atif initially did well but he inadvertently reduced his metoprolol  from I suggested dose reduction to 75 mg twice a day and apparently has only been taking 25 mg twice a day.  In addition, he has had difficulty  with sinus congestion and was taking medication with pseudoephedrine.  He called the office on 12/20/18  stating that his Apple Watch had noted that his heart rate had been increased for almost a week and when I spoke with him his resting pulse was 135.  At that time I recommended he increase metoprolol to 50 mg twice a day and see me on the following day on December 21, 2018.  During that evaluation, he remained in atrial flutter with variable block at a rate of 115 bpm.  Recommended titration of metoprolol tartrate to 100 mg twice a day.  I discussed EP evaluation with consideration for atrial flutter ablation.  I again will had a lengthy discussion regarding the need for a sleep study due to high likelihood of obstructive sleep apnea.  He initially preferred trying the metoprolol increased dose to see if this would benefit him prior to an immediate EP evaluation.   I saw him in follow-up on the increase metoprolol 100 mg grams twice a day dosing at that time he was in 2-1 flutter with ventricular rate at 130.  I strongly recommended EP evaluation with atrial flutter ablation and discussed the case with Dr. Lovena Le who agreed to see the patient in the office on December 3 and plan to do atrial flutter ablation on December 4.  Since his sleep study was tentatively scheduled for December 2 I recommended that this be deferred until after his ablation.   Leeman underwent successful ablation of his atrial flutter which was isthmus-dependent right atrial flutter upon presentation to the EP lab.  He was able to be successfully treated and was discharged later that evening in sinus rhythm.  Abdurrahman called the office yesterday complaining of some shortness of breath.  He again called the office this morning stating that he was more short of breath, very fatigued and having no  energy.  As result I added him onto my schedule today for further evaluation.  He denies any chest pain, he is unaware of any palpitations and per his apple watch monitoring his resting pulse yesterday was 56 and today 57.  He presents for evaluation   Past Medical History:  Diagnosis Date   Anxiety    takes Xanax daily as needed   Arthritis    Cataract    removed both eyes   Depression    takes Celexa daily   Diverticulitis    Diverticulosis    Enlarged prostate    sightly   Esophagitis    Gastric ulcer    GERD (gastroesophageal reflux disease)    takes Protonix daily   Hx of adenomatous colonic polyps    benign   Hyperlipidemia    takes Zocor daily   Hypertension    Insomnia    takes Ambien nightly   Joint pain    Joint swelling     Past Surgical History:  Procedure Laterality Date   A-FLUTTER ABLATION N/A 01/11/2019   Procedure: A-FLUTTER ABLATION;  Surgeon: Evans Lance, MD;  Location: [place] [facility];  Service: Cardiovascular;  Laterality: N/A;   CARDIAC CATHETERIZATION     CARDIOVERSION N/A 11/20/2018   Procedure: CARDIOVERSION;  Surgeon: Troy Sine, MD;  Location[facility];  Service: Cardiovascular;  Laterality: N/A;   cartliage removed from nose  56yr ago   cataract surgery Bilateral    CERVICAL SPINE SURGERY     COLONOSCOPY     CORONARY ARTERY BYPASS GRAFT N/A 11/16/2017   Procedure: CORONARY ARTERY BYPASS  GRAFTING (CABG) x4. LEFT ENDOSCOPIC SAPHENOUS VEIN HARVEST AND MAMMARY ARTERY TAKE DOWN. LIMA TO LAD, SVG TO PDA, SVG TO DISTAL CIRC & OMI.;  Surgeon: [name], MD;  Location: [place];  Service: Open Heart Surgery;  Laterality: N/A;   FOREIGN BODY REMOVAL ESOPHAGEAL     KNEE SURGERY Left    couple of times   LEFT HEART CATH AND CORONARY ANGIOGRAPHY N/A 11/15/2017   Procedure: LEFT HEART CATH AND CORONARY ANGIOGRAPHY;  Surgeon: Troy Sine, MD;  Location: [place] [facility];  Service: Cardiovascular;   Laterality: N/A;   NASAL SINUS SURGERY     POLYPECTOMY     TEE WITHOUT CARDIOVERSION N/A 11/16/2017   Procedure: TRANSESOPHAGEAL ECHOCARDIOGRAM (TEE);  Surgeon: [name], MD;  Location: [place];  Service: Open Heart Surgery;  Laterality: N/A;   TONSILLECTOMY     TOTAL KNEE ARTHROPLASTY Left 06/17/2014   Procedure: TOTAL KNEE ARTHROPLASTY;  Surgeon: Garald Balding, MD;  Location: [place];  Service: Orthopedics;  Laterality: Left;    Current Medications: Outpatient Medications Prior to Visit  Medication Sig Dispense Refill   acetaminophen (TYLENOL) 500 MG tablet Take 1,000 mg by mouth every 6 (six) hours as needed for moderate pain or headache.     acyclovir (ZOVIRAX) 400 MG tablet Take 400 mg by mouth 2 (two) times daily.     apixaban (ELIQUIS) 5 MG TABS tablet Take 1 tablet (5 mg total) by mouth 2 (two) times daily. 180 tablet 1   aspirin EC 81 MG tablet Take 1 tablet (81 mg total) by mouth daily. (Patient taking differently: Take 81 mg by mouth at bedtime. ) 90 tablet 3   citalopram (CELEXA) 20 MG tablet Take 20 mg by mouth daily.     ferrous sulfate 325 (65 FE) MG tablet Take 325 mg by mouth daily.     Glucosamine HCl (GLUCOSAMINE PO) Take 1 tablet by mouth 2 (two) times daily.     hydrochlorothiazide (MICROZIDE) 12.5 MG capsule Take 1 capsule (12.5 mg total) by mouth as needed (swelling). 30 capsule 3   Icosapent Ethyl (VASCEPA) 1 g CAPS Take 2 capsules (2 g total) by mouth 2 (two) times daily. 360 capsule 1   levothyroxine (SYNTHROID) 150 MCG tablet Take 1 tablet (150 mcg total) by mouth daily. (Patient taking differently: Take 150 mcg by mouth at bedtime. ) 90 tablet 1   Multiple Vitamins-Minerals (PRESERVISION AREDS 2 PO) Take 1 tablet by mouth 2 (two) times daily.     pantoprazole (PROTONIX) 20 MG tablet TAKE 2 TABLETS(40 MG) BY MOUTH DAILY (Patient taking differently: Take 40 mg by mouth at bedtime. ) 180 tablet 3   rosuvastatin (CRESTOR) 20 MG tablet Take 1  tablet (20 mg total) by mouth daily. 90 tablet 2   zolpidem (AMBIEN) 10 MG tablet Take 10 mg by mouth at bedtime.      metoprolol tartrate (LOPRESSOR) 100 MG tablet Take 1 tablet (100 mg total) by mouth 2 (two) times daily. 180 tablet 3   No facility-administered medications prior to visit.      Allergies:   Patient has no known allergies.   Social History   Socioeconomic History   Marital status: Single    Spouse name: Not on file   Number of children: 3   Years of education: Not on file   Highest education level: Not on file  Occupational History   Occupation: Owner  Social Needs   Financial resource strain: Not very hard  Food insecurity    Worry: Never true    Inability: Never true   Transportation needs    Medical: No    Non-medical: No  Tobacco Use   Smoking status: Never Smoker   Smokeless tobacco: Never Used  Substance and Sexual Activity   Alcohol use: Yes    Alcohol/week: 10.0 standard drinks    Types: 10 Standard drinks or equivalent per week    Comment: 2 drinks 5 nights per week   Drug use: No   Sexual activity: Never  Lifestyle   Physical activity    Days per week: 4 days    Minutes per session: 30 min   Stress: Only a little  Relationships   Press photographer on phone: Not on file    Gets together: Not on file    Attends religious service: Not on file    Active member of club or organization: Not on file    Attends meetings of clubs or organizations: Not on file    Relationship status: Not on file  Other Topics Concern   Not on file  Social History Narrative   Not on file    Additional social history is notable in that he is divorced x2.  He has 3 children and his oldest son lives in [place].  His second son currently had lived [place] and was married in [place].  In June 2020 he moved back to the [place] area.  His daughter works in [place] as a Government social research officer in a Web designer.  There is no  tobacco use.  He drinks alcohol.  Family History:  The patient's family history includes Colon cancer in his paternal grandfather.  Mother died at age 107 with emphysema.  His father died at 68.  He has 2 sisters ages 33 and 72.  ROS General: Negative; No fevers, chills, or night sweats; positive for fatigue HEENT: Decreased hearing with hearing aid in right ear, no sinus congestion, difficulty swallowing Pulmonary: Negative; No cough, wheezing, shortness of breath, hemoptysis Cardiovascular: See HPI GI: History of diverticular disease and colonic polyps. GU: Remote history of genital herpes; erectile dysfunction Musculoskeletal: Negative; no myalgias, joint pain, or weakness Hematologic/Oncology: Negative; no easy bruising, bleeding Endocrine: Positive for hypothyroidism Neuro: Negative; no changes in balance, headaches Skin: Negative; No rashes or skin lesions Psychiatric:  Sleep: Positive snoring snoring, previous fatigue; no bruxism, restless legs, hypnogognic hallucinations, no cataplexy Other comprehensive 14 point system review is negative.   PHYSICAL EXAM:   VS:  BP 128/80    Pulse (!) 57    Ht 5' 9"  (1.753 m)    Wt 209 lb 12.8 oz (95.2 kg)    BMI 30.98 kg/m     Repeat blood pressure today by me was 124/78 supine and 122/78 standing , Wt Readings from Last 3 Encounters:  01/16/19 209 lb 12.8 oz (95.2 kg)  01/11/19 205 lb (93 kg)  01/10/19 207 lb 9.6 oz (94.2 kg)    General: Alert, oriented, no distress.  Skin: normal turgor, no rashes, warm and dry HEENT: Normocephalic, atraumatic. Pupils equal round and reactive to light; sclera anicteric; extraocular muscles intact;  Nose without nasal septal hypertrophy Mouth/Parynx benign; Mallinpatti scale 3 Neck: No JVD, no carotid bruits; normal carotid upstroke Lungs: clear to ausculatation and percussion; no wheezing or rales Chest wall: without tenderness to palpitation Heart: PMI not displaced, regular tachycardia at 130 bpm,  s1 s2 normal, 1/6 systolic murmur, no diastolic murmur,  no rubs, gallops, thrills, or heaves Abdomen: soft, nontender; no hepatosplenomehaly, BS+; abdominal aorta nontender and not dilated by palpation. Back: no CVA tenderness Pulses 2+ Musculoskeletal: full range of motion, normal strength, no joint deformities Extremities: no clubbing cyanosis or edema, Homan's sign negative  Neurologic: grossly nonfocal; Cranial nerves grossly wnl Psychologic: Normal mood and affect   Studies/Labs Reviewed:   ECG (independently read by me): Sinus bradycardia at 57 bpm with possible left atrial enlargement.  PR interval 176 ms, QTc interval 476 ms.  No ST segment changes.  January 02, 2019 ECG (independently read by me): Atrial flutter with 2-1 AV conduction with a ventricular rate of 130 bpm.  December 21, 2018 ECG (independently read by me): Atrial flutter with variable block at 115, QT 402,QTC 556 msec  Cardioversion November 20, 2018 with restoration of sinus rhythm.  November 15, 2018 ECG (independently read by me): Atrial Flutter with variable block at 97 bpm  September 2020 ECG (independently read by me): Atrial flutter with variable block with an average heart rate at 115 bpm  October 29, 2018 ECG (independently read by me): Atrial flutter with variable block 130 bpm.  No specific ST-T changes  October 08, 2018 ECG (independently read by me): NSR at 60; isolated PVC; QTc 434 msec.  April 02, 2018 ECG (independently read by me): NSR 62; Nonspecific T change; QTc 452 msec  December 29, 2017 ECG (independently read by me): Sinus bradycardia at 49 bpm.  Nonspecific ST changes.  QTc interval 464 ms.  No ectopy.  ECG (independently read by me): Sinus rhythm at 64 bpm.  Right bundle branch block with repolarization changes.  No ectopy.  October 24, 2017 ECG (independently read by me): Normal sinus rhythm at 62 bpm.  Right bundle branch block with repolarization changes.   ECG  April 18, 2014 revealed normal sinus rhythm.  Right bundle branch block was not present.  Recent Labs: BMP Latest Ref Rng & Units 01/10/2019 11/16/2018 10/29/2018  Glucose 65 - 99 mg/dL 116(H) 103(H) 92  BUN 8 - 27 mg/dL 14 14 14   Creatinine 0.76 - 1.27 mg/dL 0.90 0.87 0.93  BUN/Creat Ratio 10 - 24 16 16 15   Sodium 134 - 144 mmol/L 138 138 137  Potassium 3.5 - 5.2 mmol/L 4.8 4.8 4.7  Chloride 96 - 106 mmol/L 101 100 100  CO2 20 - 29 mmol/L 21 22 22   Calcium 8.6 - 10.2 mg/dL 9.4 9.2 9.5     Hepatic Function Latest Ref Rng & Units 11/16/2018 10/05/2018 04/03/2018  Total Protein 6.0 - 8.5 g/dL 6.6 6.7 6.8  Albumin 3.7 - 4.7 g/dL 4.1 4.6 4.3  AST 0 - 40 IU/L 37 46(H) 32  ALT 0 - 44 IU/L 41 49(H) 32  Alk Phosphatase 39 - 117 IU/L 124(H) 91 183(H)  Total Bilirubin 0.0 - 1.2 mg/dL 0.4 0.4 0.4  Bilirubin, Direct 0.00 - 0.40 mg/dL - - -    CBC Latest Ref Rng & Units 01/10/2019 11/16/2018 10/05/2018  WBC 3.4 - 10.8 x10E3/uL 7.7 7.8 6.6  Hemoglobin 13.0 - 17.7 g/dL 15.4 14.0 15.4  Hematocrit 37.5 - 51.0 % 45.2 40.2 44.2  Platelets 150 - 450 x10E3/uL 204 211 238   Lab Results  Component Value Date   MCV 95 01/10/2019   MCV 96 11/16/2018   MCV 96 10/05/2018   Lab Results  Component Value Date   TSH 5.730 (H) 11/16/2018   Lab Results  Component Value Date   HGBA1C 5.4  11/15/2017     BNP    Component Value Date/Time   BNP 131.4 (H) 10/05/2018 1010    ProBNP No results found for: PROBNP   Lipid Panel     Component Value Date/Time   CHOL 143 10/05/2018 1010   TRIG 142 10/05/2018 1010   HDL 48 10/05/2018 1010   CHOLHDL 3.0 10/05/2018 1010   CHOLHDL 3.4 01/29/2007 0410   VLDL 17 01/29/2007 0410   LDLCALC 67 10/05/2018 1010     RADIOLOGY: No results found.   Additional studies/ records that were reviewed today include:  I reviewed his prior office visits and most recent DC cardioversion.  CONCLUSIONS:  1. Isthmus-dependent right atrial flutter upon presentation.  2.  Successful radiofrequency ablation of atrial flutter along the cavotricuspid isthmus with complete bidirectional isthmus block achieved.  3. No inducible arrhythmias following ablation.  4. No early apparent complications.   Cristopher Peru, MD  12:37 PM 01/11/2019    ASSESSMENT:    1. Typical atrial flutter ([place]): s/p ablation 01/11/2019   2. CAD in native artery   3. S/P CABG x 4   4. SOB (shortness of breath)   5. Evaluate for OSA (obstructive sleep apnea)   6. Anticoagulated   7. Hypothyroidism, unspecified type    PLAN:  Mr. 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  He has a history of mild hyperlipidemia and remotely had been on simvastatin 40 mg daily and has been followed by Dr. Hulan Fess.  In the fall 2019 he began to notice mild shortness of breath particularly with walking up steps.  When I saw him for initial evaluation an echo Doppler study revealed hyperdynamic LV function with an EF of 65 to 70% but with grade 2 diastolic dysfunction and abnormal tissue Doppler.  A  CT coronary angiogram was suggestive of significant multivessel CAD and was FFR positive.  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 Roxy Horseman.  His postoperative course was complicated by paroxysmal atrial fibrillation for which he was started on amiodarone and ultimately was discharged on Eliquis.  When I saw him for initial post hospital evaluation he was still on amiodarone 200 mg twice a day, metoprolol tartrate 25 mg twice a day.  His ECG  showed sinus bradycardia 5 weeks status post CABG revascularization; amiodarone was decreased to 200 mg daily and subsequently discontinued when subsequent laboratory showed LFT elevation and further increase in his TSH level.  Levothyroxine dose was increased several occasions earlier this summer  was increased to 125 mcg following an increased level by his primary physician.  TSH from October 05, 2018 showed improvement but TSH was still increased at 15.9 and I further titrated levothyroxine to 150 mcg.  He was maintaining sinus rhythm and denied any awareness of recurrent palpitations or arrhythmia.  In September, he had inadvertently missed several morning doses of metoprolol and was noted by his apple watch to have a tachycardic heart rate in the 130s.  Subsequent office follow-up demonstrated atrial flutter.  His dose of beta-blocker was ultimately titrated to 100 twice a day and he underwent successful cardioversion on November 20, 2018.  He had transient hypotension following cardioversion back to sinus rhythm which responded to fluids and Neo-Synephrine 40 mcg x 4.  Before he awakened, he had loud snoring.  I discussed with him the  need for sleep evaluation for sleep apnea which I had discussed previously.  He had not yet returned to the office to see me but since his cardioversion instead of reducing his metoprolol down to 75 mg twice a day with plans to switch to long-acting succinate he had been inadvertently taking only 25 mg twice a day.  In addition, he developed sinus congestion and began taking OTC sinus medication with pseudoephedrine which undoubtedly contributed to development of recurrent arrhythmia.  I saw him as an add-on on December 21, 2018 his his ECG  demonstrated atrial flutter with variable block at a rate of 115.  He believes he may have been in this for the past 5 to 7 days but since yesterday by my phone conversation he increased metoprolol to 50 mg twice a day last evening and this morning until his visit presently.  At his office visit following day on December 21, 2018 with his pulse still in the 90s to 115 range I increased metoprolol back to 100 mg twice a day.  In that evaluation I discussed EP referral for atrial flutter ablation.  I strongly recommended discontinuing all  pseudoephedrine products.  He preferred to see if the increase metoprolol would be effective.  On reevaluation 1 week later he continued to be in atrial flutter with 2-1 block at a ventricular rate of 130 despite being on metoprolol 100 mg twice daily.  He underwent successful atrial flutter ablation for isthmus dependent right atrial flutter by Dr. Lovena Le on January 11, 2019.  Over the last several days he has noticed increasing shortness of breath.  He is markedly fatigued.  He denies chest tightness or pressure.  However in the past he had never experienced any chest tightness or pressure prior to his CABG revascularization surgery.  He is unaware of any bleeding.  His ECG confirms maintenance of sinus rhythm with sinus bradycardia at 57 bpm.  There are no ST segment changes.  I am checking a bmp, TSH and CBC today.  He continues to be on levothyroxine 150 mcg. I suspect a lot of his fatigability may be contributed by his high-dose metoprolol and bradycardia.  He also is aerobically deconditioned.  For now I am recommending slight reduction of metoprolol to 75 mg twice daily with plans for probable further reduction as tolerated and after further discussion with Dr. Lovena Le.  Since he had been in rapid atrial flutter for several weeks duration I am also recommending a follow-up echo Doppler study to make certain there has not been any decline in LV function and also to make certain he does not have any evidence for pericardial effusion.  I have also recommended he undergo a [place] study since he is 14 months status post CABG revascularization surgery and he had never experienced chest pain prior to surgery but his major symptom was that of exertional dyspnea.  I will obtain a PA and lateral chest x-ray.  He is scheduled to undergo his sleep study on January 25, 2019 which I suspect will be positive for obstructive sleep apnea.  I will see him in 3 to 4 weeks for reevaluation.  Time spent: 30  minutes  Medication Adjustments/Labs and Tests Ordered: Current medicines are reviewed at length with the patient today.  Concerns regarding medicines are outlined above.  Medication changes, Labs and Tests ordered today are listed in the Patient Instructions below. Patient Instructions  Medication Instructions:  Your physician has recommended you make the following change in your  medication:   DECREASE YOUR METOPROLOL TARTRATE TO 75 MG. TAKE 1.5 TABLETS BY MOUTH TWICE A DAY  *If you need a refill on your cardiac medications before your next appointment, please call your pharmacy*  Lab Work: Your physician recommends that you have lab work today:  [place] BLOOD COUNT    THYROID STIMULATING HORMONE     If you have labs (blood work) drawn today and your tests are completely normal, you will receive your results only by:  MyChart Message (if you have MyChart) OR  A paper copy in the mail If you have any lab test that is abnormal or we need to change your treatment, we will call you to review the results.  Testing/Procedures: Your physician has requested that you have an echocardiogram. Echocardiography is a painless test that uses sound waves to create images of your heart. It provides your doctor with information about the size and shape of your heart and how well your hearts chambers and valves are working. This procedure takes approximately one hour. There are no restrictions for this procedure. LOCATION: [facility]: [place], [place], [place] [address] TO BE SCHEDULED  Your physician has requested that you have a lexiscan myoview. For further information please visit [email or web address]. Please follow instruction sheet, as given. TO BE SCHEDULED   Your physician has recommended that you have a sleep study. This test records several body functions during sleep, including: brain activity, eye movement,  oxygen and carbon dioxide blood levels, heart rate and rhythm, breathing rate and rhythm, the flow of air through your mouth and nose, snoring, body muscle movements, and chest and belly movement. THIS IS ALREADY SCHEDULED FOR 01/25/2019 AT 8:00PM  Your physician has recommended that you have a CHEST X-RAY. TO BE SCHEDULED   Follow-Up: At [facility], you and your health needs are our priority.  As part of our continuing mission to provide you with exceptional heart care, we have created designated Provider Care Teams.  These Care Teams include your primary Cardiologist (physician) and Advanced Practice Providers (APPs -  Physician Assistants and Nurse Practitioners) who all work together to provide you with the care you need, when you need it.  Your next appointment:   APPROXIMATELY 3-4 week(s)  The format for your next appointment:   In Person  Provider:   Shelva Majestic, MD      Signed, Shelva Majestic, MD  01/16/2019 3:51 PM    [place] [facility] [address], [place], [place], [place]  [address] Phone: [phone]

## 2019-01-16 NOTE — Patient Instructions (Addendum)
Medication Instructions:  Your physician has recommended you make the following change in your medication:   DECREASE YOUR METOPROLOL TARTRATE TO 75 MG. TAKE 1.5 TABLETS BY MOUTH TWICE A DAY  *If you need a refill on your cardiac medications before your next appointment, please call your pharmacy*  Lab Work: Your physician recommends that you have lab work today:  [place] BLOOD COUNT    THYROID STIMULATING HORMONE     If you have labs (blood work) drawn today and your tests are completely normal, you will receive your results only by: Marland Kitchen MyChart Message (if you have MyChart) OR . A paper copy in the mail If you have any lab test that is abnormal or we need to change your treatment, we will call you to review the results.  Testing/Procedures: Your physician has requested that you have an echocardiogram. Echocardiography is a painless test that uses sound waves to create images of your heart. It provides your doctor with information about the size and shape of your heart and how well your heart's chambers and valves are working. This procedure takes approximately one hour. There are no restrictions for this procedure. LOCATION: [facility]: [place], [place], [place] [address] TO BE SCHEDULED  Your physician has requested that you have a lexiscan myoview. For further information please visit [email or web address]. Please follow instruction sheet, as given. TO BE SCHEDULED   Your physician has recommended that you have a sleep study. This test records several body functions during sleep, including: brain activity, eye movement, oxygen and carbon dioxide blood levels, heart rate and rhythm, breathing rate and rhythm, the flow of air through your mouth and nose, snoring, body muscle movements, and chest and belly movement. THIS IS ALREADY SCHEDULED FOR 01/25/2019 AT 8:00PM  Your physician has recommended that you have  a CHEST X-RAY. TO BE SCHEDULED   Follow-Up: At [facility], you and your health needs are our priority.  As part of our continuing mission to provide you with exceptional heart care, we have created designated Provider Care Teams.  These Care Teams include your primary Cardiologist (physician) and Advanced Practice Providers (APPs -  Physician Assistants and Nurse Practitioners) who all work together to provide you with the care you need, when you need it.  Your next appointment:   APPROXIMATELY 3-4 week(s)  The format for your next appointment:   In Person  Provider:   Shelva Majestic, MD

## 2019-01-17 LAB — BASIC METABOLIC PANEL
BUN/Creatinine Ratio: 24 (ref 10–24)
BUN: 18 mg/dL (ref 8–27)
CO2: 21 mmol/L (ref 20–29)
Calcium: 8.7 mg/dL (ref 8.6–10.2)
Chloride: 102 mmol/L (ref 96–106)
Creatinine, Ser: 0.75 mg/dL — ABNORMAL LOW (ref 0.76–1.27)
GFR calc Af Amer: 104 mL/min/{1.73_m2} (ref >59)
GFR calc non Af Amer: 90 mL/min/{1.73_m2} (ref >59)
Glucose: 79 mg/dL (ref 65–99)
Potassium: 4.4 mmol/L (ref 3.5–5.2)
Sodium: 139 mmol/L (ref 134–144)

## 2019-01-17 LAB — CBC
Hematocrit: 41.9 % (ref 37.5–51.0)
Hemoglobin: 14.3 g/dL (ref 13.0–17.7)
MCH: 32.5 pg (ref 26.6–33.0)
MCHC: 34.1 g/dL (ref 31.5–35.7)
MCV: 95 fL (ref 79–97)
Platelets: 159 10*3/uL (ref 150–450)
RBC: 4.4 x10E6/uL (ref 4.14–5.80)
RDW: 12.7 % (ref 11.6–15.4)
WBC: 6.4 10*3/uL (ref 3.4–10.8)

## 2019-01-17 LAB — TSH: TSH: 4.05 u[IU]/mL (ref 0.450–4.500)

## 2019-01-20 NOTE — Telephone Encounter (Signed)
followup as per our usual 3-4 weeks after ablation. GT

## 2019-01-21 ENCOUNTER — Other Ambulatory Visit: Payer: Self-pay

## 2019-01-21 ENCOUNTER — Ambulatory Visit
Admission: RE | Admit: 2019-01-21 | Discharge: 2019-01-21 | Disposition: A | Discharge status: Home / Self Care | Payer: PPO | Source: Ambulatory Visit | Attending: Cardiovascular Disease | Admitting: Cardiovascular Disease

## 2019-01-21 DIAGNOSIS — R0602 Shortness of breath: Secondary | ICD-10-CM

## 2019-01-21 NOTE — Telephone Encounter (Signed)
Forwarding to EP scheduler to move pts post ablation f/u sooner than 03/14/19. (spoke to her on phone about this). Forwarding to Film/video editor for her [employer]

## 2019-01-21 NOTE — Telephone Encounter (Signed)
Pt is scheduled with GT on February 19, 2019.  No further action is necessary.

## 2019-01-22 ENCOUNTER — Telehealth (HOSPITAL_COMMUNITY): Payer: Self-pay

## 2019-01-22 DIAGNOSIS — H6123 Impacted cerumen, bilateral: Secondary | ICD-10-CM | POA: Diagnosis not present

## 2019-01-22 NOTE — Telephone Encounter (Signed)
Encounter complete. 

## 2019-01-23 ENCOUNTER — Other Ambulatory Visit (HOSPITAL_COMMUNITY)
Admission: RE | Admit: 2019-01-23 | Discharge: 2019-01-23 | Disposition: A | Discharge status: Home / Self Care | Payer: PPO | Source: Ambulatory Visit | Attending: Cardiovascular Disease | Admitting: Cardiovascular Disease

## 2019-01-23 ENCOUNTER — Ambulatory Visit (HOSPITAL_BASED_OUTPATIENT_CLINIC_OR_DEPARTMENT_OTHER): Payer: PPO

## 2019-01-23 ENCOUNTER — Other Ambulatory Visit: Payer: Self-pay

## 2019-01-23 DIAGNOSIS — I2510 Atherosclerotic heart disease of native coronary artery without angina pectoris: Secondary | ICD-10-CM | POA: Diagnosis not present

## 2019-01-23 DIAGNOSIS — I358 Other nonrheumatic aortic valve disorders: Secondary | ICD-10-CM | POA: Insufficient documentation

## 2019-01-23 DIAGNOSIS — E785 Hyperlipidemia, unspecified: Secondary | ICD-10-CM | POA: Insufficient documentation

## 2019-01-23 DIAGNOSIS — Z20828 Contact with and (suspected) exposure to other viral communicable diseases: Secondary | ICD-10-CM | POA: Insufficient documentation

## 2019-01-23 DIAGNOSIS — R0602 Shortness of breath: Secondary | ICD-10-CM | POA: Diagnosis not present

## 2019-01-23 DIAGNOSIS — Z951 Presence of aortocoronary bypass graft: Secondary | ICD-10-CM

## 2019-01-23 DIAGNOSIS — I119 Hypertensive heart disease without heart failure: Secondary | ICD-10-CM | POA: Insufficient documentation

## 2019-01-23 DIAGNOSIS — Z01812 Encounter for preprocedural laboratory examination: Secondary | ICD-10-CM | POA: Insufficient documentation

## 2019-01-23 LAB — SARS CORONAVIRUS 2 (TAT 6-24 HRS): SARS Coronavirus 2: NEGATIVE

## 2019-01-23 MED ORDER — PERFLUTREN LIPID MICROSPHERE
1.0000 mL | INTRAVENOUS | Status: AC | PRN
  Administered 2019-01-23: 2 mL via INTRAVENOUS

## 2019-01-24 ENCOUNTER — Ambulatory Visit (HOSPITAL_COMMUNITY)
Admission: RE | Admit: 2019-01-24 | Discharge: 2019-01-24 | Disposition: A | Discharge status: Home / Self Care | Payer: PPO | Source: Ambulatory Visit | Attending: Cardiology | Admitting: Cardiology

## 2019-01-24 DIAGNOSIS — Z951 Presence of aortocoronary bypass graft: Secondary | ICD-10-CM | POA: Diagnosis not present

## 2019-01-24 DIAGNOSIS — R0602 Shortness of breath: Secondary | ICD-10-CM | POA: Diagnosis not present

## 2019-01-24 LAB — MYOCARDIAL PERFUSION IMAGING
LV dias vol: 117 mL (ref 62–150)
LV sys vol: 46 mL
Peak HR: 68 {beats}/min
Rest HR: 52 {beats}/min
SDS: 0
SRS: 0
SSS: 0
TID: 1.08

## 2019-01-24 MED ORDER — TECHNETIUM TC 99M TETROFOSMIN IV KIT
10.2000 | PACK | Freq: Once | INTRAVENOUS | Status: AC | PRN
  Administered 2019-01-24: 10.2 via INTRAVENOUS
  Filled 2019-01-24: qty 11

## 2019-01-24 MED ORDER — TECHNETIUM TC 99M TETROFOSMIN IV KIT
30.7000 | PACK | Freq: Once | INTRAVENOUS | Status: AC | PRN
  Administered 2019-01-24: 30.7 via INTRAVENOUS
  Filled 2019-01-24: qty 31

## 2019-01-24 MED ORDER — REGADENOSON 0.4 MG/5ML IV SOLN
0.4000 mg | Freq: Once | INTRAVENOUS | Status: AC
  Administered 2019-01-24: 0.4 mg via INTRAVENOUS

## 2019-01-25 ENCOUNTER — Other Ambulatory Visit: Payer: Self-pay

## 2019-01-25 ENCOUNTER — Ambulatory Visit (HOSPITAL_BASED_OUTPATIENT_CLINIC_OR_DEPARTMENT_OTHER): Payer: PPO | Attending: Cardiovascular Disease | Admitting: Cardiovascular Disease

## 2019-01-25 DIAGNOSIS — Z7982 Long term (current) use of aspirin: Secondary | ICD-10-CM | POA: Diagnosis not present

## 2019-01-25 DIAGNOSIS — I4891 Unspecified atrial fibrillation: Secondary | ICD-10-CM | POA: Diagnosis not present

## 2019-01-25 DIAGNOSIS — G4733 Obstructive sleep apnea (adult) (pediatric): Secondary | ICD-10-CM | POA: Insufficient documentation

## 2019-01-25 DIAGNOSIS — G4731 Primary central sleep apnea: Secondary | ICD-10-CM | POA: Diagnosis not present

## 2019-01-25 DIAGNOSIS — Z7901 Long term (current) use of anticoagulants: Secondary | ICD-10-CM | POA: Diagnosis not present

## 2019-01-25 DIAGNOSIS — R0683 Snoring: Secondary | ICD-10-CM | POA: Diagnosis not present

## 2019-01-25 DIAGNOSIS — Z79899 Other long term (current) drug therapy: Secondary | ICD-10-CM | POA: Diagnosis not present

## 2019-01-27 ENCOUNTER — Other Ambulatory Visit: Payer: Self-pay | Admitting: Cardiovascular Disease

## 2019-01-27 ENCOUNTER — Encounter (HOSPITAL_BASED_OUTPATIENT_CLINIC_OR_DEPARTMENT_OTHER): Payer: Self-pay | Admitting: Cardiovascular Disease

## 2019-01-27 NOTE — Procedures (Signed)
Patient Name: Carl, [name] Date: 01/25/2019 Gender: Male D.O.B: [birth year] Age (years): 22 Referring Provider: Shelva Majestic MD, ABSM Height (inches): 69 Interpreting Physician: Shelva Majestic MD, ABSM Weight (lbs): 198 RPSGT: Earney [place] BMI: 29 MRN: [id number] Neck Size: 17.50  CLINICAL INFORMATION The patient is referred for a split night study with BPAP.  MEDICATIONS acetaminophen (TYLENOL) 500 MG tablet  acyclovir (ZOVIRAX) 400 MG tablet  apixaban (ELIQUIS) 5 MG TABS tablet(Expired)  aspirin EC 81 MG tablet  citalopram (CELEXA) 20 MG tablet  ferrous sulfate 325 (65 FE) MG tablet  Glucosamine HCl (GLUCOSAMINE PO)  hydrochlorothiazide (MICROZIDE) 12.5 MG capsule(Expired)  Icosapent Ethyl (VASCEPA) 1 g CAPS  levothyroxine (SYNTHROID) 150 MCG tablet  metoprolol tartrate (LOPRESSOR) 50 MG tablet  Multiple Vitamins-Minerals (PRESERVISION AREDS 2 PO)  pantoprazole (PROTONIX) 20 MG tablet  rosuvastatin (CRESTOR) 20 MG tablet  zolpidem (AMBIEN) 10 MG tablet   Medications self-administered by patient taken the night of the study : N/A  SLEEP STUDY TECHNIQUE As per the AASM Manual for the Scoring of Sleep and Associated Events v2.3 (April 2016) with a hypopnea requiring 4% desaturations.  The channels recorded and monitored were frontal, central and occipital EEG, electrooculogram (EOG), submentalis EMG (chin), nasal and oral airflow, thoracic and abdominal wall motion, anterior tibialis EMG, snore microphone, electrocardiogram, and pulse oximetry. Bi-level positive airway pressure (BiPAP) was initiated when the patient met split night criteria and was titrated according to treat sleep-disordered breathing.  RESPIRATORY PARAMETERS Diagnostic Total AHI (/hr): 63.5 RDI (/hr): 65.8 OA Index (/hr): 17.7 CA Index (/hr): 5.9 REM AHI (/hr): N/A NREM AHI (/hr): 63.5 Supine AHI (/hr): 63.5 Non-supine AHI (/hr): N/A Min O2 Sat (%): 81.0 Mean O2 (%): 93.0 Time below 88%  (min): 18.7   Titration Optimal IPAP Pressure (cm): 18 Optimal EPAP Pressure (cm): 14 AHI at Optimal Pressure (/hr): 3.6 Min O2 at Optimal Pressure (%): 93.0 Sleep % at Optimal (%): 97 Supine % at Optimal (%): 7   SLEEP ARCHITECTURE The study was initiated at 10:10:34 PM and terminated at 4:34:09 AM. The total recorded time was 383.6 minutes. EEG confirmed total sleep time was 333.3 minutes yielding a sleep efficiency of 86.9%%. Sleep onset after lights out was 0.8 minutes with a REM latency of 195.0 minutes. The patient spent 1.5%% of the night in stage N1 sleep, 80.9%% in stage N2 sleep, 0.0%% in stage N3 and 17.6% in REM. Wake after sleep onset (WASO) was 49.5 minutes. The Arousal Index was 32.0/hour.  LEG MOVEMENT DATA The total Periodic Limb Movements of Sleep (PLMS) were 0. The PLMS index was 0.0 .  CARDIAC DATA The 2 lead EKG demonstrated sinus rhythm. The mean heart rate was 100.0 beats per minute. Other EKG findings include: None.  IMPRESSIONS - Severe obstructive sleep apnea occurred during the diagnostic portion of the study (AHI 63.5 /h; RDI 65.8/h). CPAP was initiated at 5 cm and was titrated to 9 cm . Due to frequent central events, BiPAP was implemented at 11/7 and was titrated to 18/14 cm of water: AHI 3.6/h, O2 nadir 93%. 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 - Moderate oxygen desaturation was noted during the diagnostic portion of the study (Min O2 = 81.0%). - The patient snored with loud snoring volume during the diagnostic portion of the study. - It appears that the patient was in atrial fibrillation throughtout the study. - Clinically significant periodic limb movements of sleep did not  occur during the study.  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  A Small size [employer]kel Full Face Mask Simplus mask was used for the titration. If patient continues to experience central events, ASV titration may be necessary. - Effort should be made to optimize nasal and oropharyngeal patency. - Avoid alcohol, sedatives and other CNS depressants that may worsen sleep apnea and disrupt normal sleep architecture. - Sleep hygiene should be reviewed to assess factors that may improve sleep quality. - Weight management and regular exercise should be initiated or continued. - Recoomend f/u cardiology evalution for probable recurrent atrial fibrillation.  - Recommend a download in 30 days and sleep clinic evaluation after 30 days of therapy.  [Electronically signed] 01/27/2019 04:13 PM  Thomas Kelly MD, FACC, ABSM Diplomate, American Board of Sleep Medicine   NPI: [id number] [facility] PH: [phone]   FX: [phone] ACCREDITED BY THE AMERICAN ACADEMY OF SLEEP MEDICINE 

## 2019-01-28 ENCOUNTER — Telehealth: Payer: Self-pay | Admitting: *Deleted

## 2019-01-28 NOTE — Telephone Encounter (Signed)
Patient notified of sleep study results and recommendations. BIPAP orders has been sent to [facility].

## 2019-01-31 ENCOUNTER — Other Ambulatory Visit: Payer: Self-pay | Admitting: Cardiovascular Disease

## 2019-01-31 MED ORDER — METOPROLOL TARTRATE 50 MG PO TABS: 75.0000 mg | ORAL_TABLET | Freq: Two times a day (BID) | ORAL | 3 refills | Status: DC

## 2019-02-04 DIAGNOSIS — G4733 Obstructive sleep apnea (adult) (pediatric): Secondary | ICD-10-CM | POA: Diagnosis not present

## 2019-02-11 ENCOUNTER — Telehealth: Payer: Self-pay | Admitting: Cardiovascular Disease

## 2019-02-11 NOTE — Telephone Encounter (Signed)
Spoke with patient regarding sleep compliance appointment with Dr. Lysbeth Galas 04/25/19 at 10:40 am.  Patient voiced his understanding.

## 2019-02-14 ENCOUNTER — Ambulatory Visit (INDEPENDENT_AMBULATORY_CARE_PROVIDER_SITE_OTHER): Payer: PPO | Admitting: Cardiovascular Disease

## 2019-02-14 ENCOUNTER — Other Ambulatory Visit: Payer: Self-pay

## 2019-02-14 ENCOUNTER — Encounter: Payer: Self-pay | Admitting: Cardiovascular Disease

## 2019-02-14 VITALS — BP 144/86 | HR 51 | Temp 96.9°F | Ht 69.0 in | Wt 200.0 lb

## 2019-02-14 DIAGNOSIS — Z951 Presence of aortocoronary bypass graft: Secondary | ICD-10-CM | POA: Diagnosis not present

## 2019-02-14 DIAGNOSIS — Z7901 Long term (current) use of anticoagulants: Secondary | ICD-10-CM | POA: Diagnosis not present

## 2019-02-14 DIAGNOSIS — I480 Paroxysmal atrial fibrillation: Secondary | ICD-10-CM

## 2019-02-14 DIAGNOSIS — E785 Hyperlipidemia, unspecified: Secondary | ICD-10-CM | POA: Diagnosis not present

## 2019-02-14 DIAGNOSIS — I2510 Atherosclerotic heart disease of native coronary artery without angina pectoris: Secondary | ICD-10-CM

## 2019-02-14 DIAGNOSIS — E039 Hypothyroidism, unspecified: Secondary | ICD-10-CM

## 2019-02-14 DIAGNOSIS — I483 Typical atrial flutter: Secondary | ICD-10-CM

## 2019-02-14 DIAGNOSIS — G4733 Obstructive sleep apnea (adult) (pediatric): Secondary | ICD-10-CM | POA: Diagnosis not present

## 2019-02-14 NOTE — Progress Notes (Addendum)
Cardiology Office Note    Date:  02/16/2019   ID:  [name], DOB Oct 01, 1945, MRN [id number]  PCP:  Hulan Fess, MD  Cardiologist:  Shelva Majestic, MD    History of Present Illness:  Carl [name] is a 74 y.o. male who presents to the office today for a follow-up evaluation of his atrial for ablation and sleep study with BiPAP initiation.  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  In 2009 he developed transient numbness of his right face which lasted for several minutes and at that time apparently underwent evaluation and had a normal MRI, MRA, and carotid duplex studies.  He was found to have only mild to moderate plaque in the proximal right internal carotid artery without stenosis.  I had seen him in March 2016 for preoperative cardiology clearance prior to undergoing left knee replacement surgery by Dr. Durward Fortes.  Preoperative ECG suggested possible junctional rhythm which was new from an ECG of 2012 which previously had only shown sinus bradycardia.  He was asymptomatic.  When I saw him, his ECG showed sinus rhythm at 61 bpm with normal intervals and on that ECG P waves were normal and upright inferiorly.  Carl [name] has remained fairly active. In 2019  he had not been exercising as much as he had in the past still working out with a trainer and denied any associated chest pain or palpitations.  He in September 201 19 he began to notice more shortness of breath with walking up steps.  He was  at a friend's house and was told that he appeared to be more short of breath than previously.  I saw him for evaluation of his exertional shortness of breath on October 24, 2017.  At that time, I recommended that he undergo a 2D echo Doppler study and scheduled him for coronary CT angiography.  His echo Doppler study demonstrated hyperdynamic LV function with an EF of 65 to 70%.  Doppler parameters suggest grade 2 diastolic dysfunction and  elevated ventricular end-diastolic filling pressure.  He had mild MR, mild TR, and mild dilation of his left atrium.  Pulmonary pressures were normal.  CT coronary angiography was performed on November 09, 2017.  This was abnormal and demonstrated an elevated calcium score a 25 which is 78th percentile for age and sex.  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  There was greater than 75% calcific plaque in his proximal and mid diagonal 1 vessel.  The circumflex had 50% proximal plaque.  There was 50 to 75% mixed plaque in the OM 2 vessel less than 50% in the OM1 vessel.  His RCA had 50% calcified plaque proximally, 50 to 75% calcified plaque in the mid vessel.  There was mild aortic root dilatation at 4.1 cm.  His CT images were referred for [facility] analysis which were positive in the mid RCA 0.78, the mid LAD 0.78 and in the AV groove circumflex at 0.71.   I saw him in follow-up of the above studies and recommended cardiac catheterization.  Catheterization was done in November 15, 2017 which showed severe multivessel CAD with 85% ostial LAD stenosis, diffuse 75% mid LAD stenosis, total occlusion of the mid AV groove circumflex after the takeoff of the second obtuse marginal vessel with 50% diffuse stenosis in the first large  marginal branch, 80 and 90% stenoses in the second obtuse marginal vessel and extensive collateralization to the distal circumflex and third marginal via the LAD.  He had 1650% mid RCA stenoses and a dominant RCA.  Of note, he had probable high right radial artery takeoff with spasm/coarse stenosis above the elbow and there was retrograde filling of the brachial artery and the catheterization was transition to the femoral approach.  He underwent successful CABG revascularization surgery the following day by Dr. Roxy Horseman on November 16, 2017 with a LIMA graft to his LAD, SVG to  first marginal and distal circumflex, and SVG to the PDA of his RCA with left thigh and calf endoscopic vein harvest.  He developed atrial fibrillation with RVR on postop day 2 and was started on IV amiodarone.  He ultimately converted back to sinus rhythm but developed recurrent AF on postop day 4 on metoprolol and amiodarone and ultimately converted back to sinus rhythm.  He was started on Eliquis on day 5 for anticoagulation.  He was discharged on November 22, 2017.  Approximately 10 days later he started to develop left lower extremity swelling and redness with pain in the back of his calf.  He was evaluated by Ellwood Handler, PA-C at [facility] on December 04, 2017.  There was no sign of infection.  Venous duplex imaging did not reveal any DVT.  He saw Dr. Servando Snare for initial follow-up evaluation on December 14, 2017 at which time he was maintaining sinus rhythm.  He will be participating in cardiac rehab and his orientation is scheduled for February 15, 2018.  He denied any recurrent chest pain.  His breathing has improved with walking.  He went to the beach and was walking at least a mile per day.  He is sleeping better and snoring is less.    Since his November 2019 and prior to his February 2020 evaluation he was doing well and was  participating in cardiac rehabilitation and also had joined a gym. He was exercising regularly.  He denied chest pain, PND, or orthopnea.  Subsequent laboratory showed further TSH elevation.  His levothyroxine was increased to 75 mcg.  Amiodarone was discontinued due to LFT elevation and his dose of rosuvastatin was reduced to one half of the current dose.  I  saw him in February 2020 at that time he had not had follow-up laboratory.    He had had follow-up laboratory which continued to show hypothyroidism and his dose of levothyroxine has gradually been increased.  Recent laboratory done by Dr. Rex Kras several months ago continues to show a TSH was elevated and his levothyroxine dose was  increased from 100 to 125 mcg.  During the COVID-19 pandemic, he admits to weight gain.  He has not been exercising as much as he had in the past.  Although he felt well, when recently seen by Dr. Durward Fortes he was felt to potentially be more short of breath.  He was advised to see me back for follow-up evaluation.  I scheduled him to undergo repeat laboratory which was done on October 05, 2018.  Hemoglobin 15.4, hematocrit 44.2.  Lipid studies were significantly improved from 6 months previously with total cholesterol now 143, triglycerides 142, HDL 48, LDL 67.  Brain natruretic peptide was 131.  TSH had significantly improved but was still elevated at 15.9.  He had normal renal function with a BUN of 15 creatinine 0.96.  LFTs were significantly improved but minimally increased with an AST of 46 and  ALT at 49.  He denied any chest pain or awareness of palpitations.  I saw him for reevaluation on October 08, 2018.  During that evaluation, I reviewed his laboratory and felt he was euvolemic.  His ECG showed normal sinus rhythm at 60 bpm with an isolated PVC.  His QTc interval was 434 ms.  With his continued TSH elevation, levothyroxine was further titrated to 150 mcg.  Subsequently, Adriene has continued to feel well.  He denied any awareness of palpitations.  Last week, in October 29, 2018 when checking his Apple watch he saw report that his heart rate was increased in the upper 130s.  He was unaware of this irregularity.  Upon further questioning, he had to go out of town over the last several days and believes he may have missed taking his morning metoprolol dose on Friday and Saturday and he could not remember if he had taken it yesterday morning.  He was supposed to be taken metoprolol tartrate 25 mg twice a day, HCTZ on a as needed basis which he has not needed.  He continues to be on Eliquis 5 mg twice a day and is tolerated the levothyroxine 150 mcg dose.  He is now on the vascepa which was just renewed last  week 2 capsules twice a day, rosuvastatin 20 mg daily for mixed hyperlipidemia.  After receiving his phone call, I advised that he come to the office to check an EKG.  With his ECG showing atrial flutter at a rate of 130 he was added onto my schedule for further evaluation.  During that evaluation, he was asymptomatic and felt well.  His ECG showed atrial flutter with variable block at 130 bpm with nonspecific ST-T changes.  He was on Eliquis for anticoagulation.  I recommended titration of metoprolol tartrate to 50 mg twice a day.  Laboratory drawn that day revealed a TSH 5.95, magnesium 2.3, potassium 4.7, BUN 14 creatinine 0.9.   I saw him on September 28 for follow-up evaluation.  He was feeling well and continue to be unaware of his heart rate irregularity.  However according to his Apple Watch, his heart rate was running in the 115-120 range   He denied chest pain.  He denied tremors.  He was sleeping well and denied any awareness of sleep apnea.  During that evaluation we discussed gradually him for DC cardioversion but this would require COVID-19 testing with quarantine for 3 days prior to having the procedure done.  His daughter was coming into town that weekend.  As result we further titrated his metoprolol to 75 mg twice a day which he did for 4 days and then increase to 200 mg twice a day.  He continues to be on Eliquis for anticoagulation.  He admits to increased fatigue but denies chest pain.   I saw Brysan in follow-up on November 15, 2018.  At that time he had titrated the metoprolol up to 100 mg twice a day.  He underwent successful DC cardioversion on November 20, 2018 with restoration of sinus rhythm.  Of note, he developed hypotension prior to awakening from propofol and received normal saline and Neo-Synephrine.  Blood pressure normalized loud snoring was noted, and it was advised to consider sleep study evaluation.  Over the month after his cardioversion Hatem initially did well but he  inadvertently reduced his metoprolol from I suggested dose reduction to 75 mg twice a day and apparently has only been taking 25 mg twice a day.  In  addition, he has had difficulty with sinus congestion and was taking medication with pseudoephedrine.  He called the office on 12/20/18  stating that his Apple Watch had noted that his heart rate had been increased for almost a week and when I spoke with him his resting pulse was 135.  At that time I recommended he increase metoprolol to 50 mg twice a day and see me on the following day on December 21, 2018.  During that evaluation, he remained in atrial flutter with variable block at a rate of 115 bpm.  Recommended titration of metoprolol tartrate to 100 mg twice a day.  I discussed EP evaluation with consideration for atrial flutter ablation.  I again will had a lengthy discussion regarding the need for a sleep study due to high likelihood of obstructive sleep apnea.  He initially preferred trying the metoprolol increased dose to see if this would benefit him prior to an immediate EP evaluation.   I saw him in follow-up on the increase metoprolol 100 mg grams twice a day dosing at that time he was in 2-1 flutter with ventricular rate at 130.  I strongly recommended EP evaluation with atrial flutter ablation and discussed the case with Dr. Lovena Le who agreed to see the patient in the office on December 3 and plan to do atrial flutter ablation on December 4.  Since his sleep study was tentatively scheduled for December 2 I recommended that this be deferred until after his ablation.   Eain underwent successful ablation of his atrial flutter on January 11, 2019 which was isthmus-dependent right atrial flutter upon presentation to the EP lab.  He was able to be successfully treated and was discharged later that evening in sinus rhythm.  I last saw him on January 16, 2019 after he called the office the day previously complaining of some shortness of breath.  He again  called the office this morning stating that he was more short of breath, very fatigued and having no energy.  As result I added him onto my schedule  for further evaluation.  He denied any chest pain and was unaware of any palpitations; per his apple watch monitoring his resting pulse yesterday was 56 and today 57.    During that evaluation, I recommended that he undergo a chest x-ray, follow-up 2D echo Doppler study, and scheduled him for [place] study to make certain his dyspnea was not ischemia mediated.  His echo Doppler study showed EF at 60 to 65% with mild LVH.  There was abnormal septal motion consistent with his postoperative state.  There was grade 2 diastolic dysfunction.  He had severe biatrial enlargement.  A chest x-ray did not reveal any active disease.  A Lexiscan Myoview study was low risk without ischemia with nuclear stress EF at 60%.  He underwent his sleep study on January 25, 2019.  Apparently several days prior to his sleep study he apparently again ran out of his metoprolol.  Of note during the sleep study his ECG revealed that he was in atrial fibrillation.  He was found to have severe obstructive sleep apnea in a split-night protocol with an AHI of 63.5/h.  CPAP was initiated but due to continued events was transitioned to BiPAP therapy and he was ultimately titrated to 18/14.  AHI at this level was 3.6 with oxygen nadir 93%.  Alphonsus resumed metoprolol subsequently.  His set up date for BiPAP was February 04, 2019.  Over the past week and a half on  therapy he has noticed significant improvement in his previous fatigability.  A download was obtained since his set up date and he is 100% compliant since December 28.  He does have a mask leak.  At 18/14 BiPAP pressure AHI was improved but still increased at 6.3.  There were no central events.  He presents for evaluation  Past Medical History:  Diagnosis Date  . Anxiety    takes Xanax daily as needed  . Arthritis   . Cataract     removed both eyes  . Depression    takes Celexa daily  . Diverticulitis   . Diverticulosis   . Enlarged prostate    sightly  . Esophagitis   . Gastric ulcer   . GERD (gastroesophageal reflux disease)    takes Protonix daily  . Hx of adenomatous colonic polyps    benign  . Hyperlipidemia    takes Zocor daily  . Hypertension   . Insomnia    takes Ambien nightly  . Joint pain   . Joint swelling     Past Surgical History:  Procedure Laterality Date  . A-FLUTTER ABLATION N/A 01/11/2019   Procedure: A-FLUTTER ABLATION;  Surgeon: Evans Lance, MD;  Location: [facility];  Service: Cardiovascular;  Laterality: N/A;  . CARDIAC CATHETERIZATION    . CARDIOVERSION N/A 11/20/2018   Procedure: CARDIOVERSION;  Surgeon: Troy Sine, MD;  Location: [facility];  Service: Cardiovascular;  Laterality: N/A;  . cartliage removed from nose  51yr ago  . cataract surgery Bilateral   . CERVICAL SPINE SURGERY    . COLONOSCOPY    . CORONARY ARTERY BYPASS GRAFT N/A 11/16/2017   Procedure: CORONARY ARTERY BYPASS GRAFTING (CABG) x4. LEFT ENDOSCOPIC SAPHENOUS VEIN HARVEST AND MAMMARY ARTERY TAKE DOWN. LIMA TO LAD, SVG TO PDA, SVG TO DISTAL CIRC & OMI.;  Surgeon: GGrace Isaac MD;  [place] [facility]  Service: Open Heart Surgery;  Laterality: N/A;  . FOREIGN BODY REMOVAL ESOPHAGEAL    . KNEE SURGERY Left    couple of times  . LEFT HEART CATH AND CORONARY ANGIOGRAPHY N/A 11/15/2017   Procedure: LEFT HEART CATH AND CORONARY ANGIOGRAPHY;  Surgeon: KTroy Sine MD;  [facility];  Service: Cardiovascular;  Laterality: N/A;  . NASAL SINUS SURGERY    . POLYPECTOMY    . TEE WITHOUT CARDIOVERSION N/A 11/16/2017   Procedure: TRANSESOPHAGEAL ECHOCARDIOGRAM (TEE);  Surgeon: GGrace Isaac MD;  [place] [facility]  Service: Open Heart Surgery;  Laterality: N/A;  . TONSILLECTOMY    . TOTAL KNEE ARTHROPLASTY Left 06/17/2014   Procedure: TOTAL KNEE ARTHROPLASTY;  Surgeon:  PGarald Balding MD;  [place] [facility]  Service: Orthopedics;  Laterality: Left;    Current Medications: Outpatient Medications Prior to Visit  Medication Sig Dispense Refill  . acetaminophen (TYLENOL) 500 MG tablet Take 1,000 mg by mouth every 6 (six) hours as needed for moderate pain or headache.    .Marland Kitchenacyclovir (ZOVIRAX) 400 MG tablet Take 400 mg by mouth 2 (two) times daily.    .Marland Kitchenapixaban (ELIQUIS) 5 MG TABS tablet Take 1 tablet (5 mg total) by mouth 2 (two) times daily. 180 tablet 1  . aspirin EC 81 MG tablet Take 1 tablet (81 mg total) by mouth daily. (Patient taking differently: Take 81 mg by mouth at bedtime. ) 90 tablet 3  . citalopram (CELEXA) 20 MG tablet Take 20 mg by mouth daily.    . ferrous sulfate 325 (65  FE) MG tablet Take 325 mg by mouth daily.    . Glucosamine HCl (GLUCOSAMINE PO) Take 1 tablet by mouth 2 (two) times daily.    Vanessa Kick Ethyl (VASCEPA) 1 g CAPS Take 2 capsules (2 g total) by mouth 2 (two) times daily. 360 capsule 1  . levothyroxine (SYNTHROID) 150 MCG tablet Take 1 tablet (150 mcg total) by mouth daily. (Patient taking differently: Take 150 mcg by mouth at bedtime. ) 90 tablet 1  . metoprolol tartrate (LOPRESSOR) 50 MG tablet Take 1.5 tablets (75 mg total) by mouth 2 (two) times daily. 180 tablet 3  . pantoprazole (PROTONIX) 20 MG tablet TAKE 2 TABLETS(40 MG) BY MOUTH DAILY 180 tablet 3  . rosuvastatin (CRESTOR) 20 MG tablet Take 1 tablet (20 mg total) by mouth daily. 90 tablet 2  . zolpidem (AMBIEN) 10 MG tablet Take 10 mg by mouth at bedtime.     . hydrochlorothiazide (MICROZIDE) 12.5 MG capsule Take 1 capsule (12.5 mg total) by mouth as needed (swelling). 30 capsule 3  . Multiple Vitamins-Minerals (PRESERVISION AREDS 2 PO) Take 1 tablet by mouth 2 (two) times daily.     No facility-administered medications prior to visit.     Allergies:   Patient has no known allergies.   Social History   Socioeconomic History  . Marital status: Single     Spouse name: Not on file  . Number of children: 3  . Years of education: Not on file  . Highest education level: Not on file  Occupational History  . Occupation: Owner  Tobacco Use  . Smoking status: Never Smoker  . Smokeless tobacco: Never Used  Substance and Sexual Activity  . Alcohol use: Yes    Alcohol/week: 10.0 standard drinks    Types: 10 Standard drinks or equivalent per week    Comment: 2 drinks 5 nights per week  . Drug use: No  . Sexual activity: Never  Other Topics Concern  . Not on file  Social History Narrative  . Not on file   Social Determinants of Health   Financial Resource Strain:   . Difficulty of Paying Living Expenses: Not on file  Food Insecurity:   . Worried About Charity fundraiser in the Last Year: Not on file  . Ran Out of Food in the Last Year: Not on file  Transportation Needs:   . Lack of Transportation (Medical): Not on file  . Lack of Transportation (Non-Medical): Not on file  Physical Activity:   . Days of Exercise per Week: Not on file  . Minutes of Exercise per Session: Not on file  Stress:   . Feeling of Stress : Not on file  Social Connections:   . Frequency of Communication with Friends and Family: Not on file  . Frequency of Social Gatherings with Friends and Family: Not on file  . Attends Religious Services: Not on file  . Active Member of Clubs or Organizations: Not on file  . Attends Archivist Meetings: Not on file  . Marital Status: Not on file    Additional social history is notable in that he is divorced x2.  He has 3 children and his oldest son lives in [place].  His second son currently had lived [place] and was married in [place].  In June 2020 he moved back to the [place] area.  His daughter works in [place] as a Government social research officer in a Web designer.  There is no tobacco use.  He drinks alcohol.  Family History:  The patient's family history includes Colon cancer in his paternal grandfather.   Mother died at age 69 with emphysema.  His father died at 58.  He has 2 sisters ages 58 and 39.  ROS General: Negative; No fevers, chills, or night sweats; positive for fatigue HEENT: Decreased hearing with hearing aid in right ear, no sinus congestion, difficulty swallowing Pulmonary: Negative; No cough, wheezing, shortness of breath, hemoptysis Cardiovascular: See HPI GI: History of diverticular disease and colonic polyps. GU: Remote history of genital herpes; erectile dysfunction Musculoskeletal: Negative; no myalgias, joint pain, or weakness Hematologic/Oncology: Negative; no easy bruising, bleeding Endocrine: Positive for hypothyroidism Neuro: Negative; no changes in balance, headaches Skin: Negative; No rashes or skin lesions Psychiatric:  Sleep: Positive snoring snoring, previous fatigue; documented to have severe OSA now on BiPAP therapy; no bruxism, restless legs, hypnogognic hallucinations, no cataplexy Other comprehensive 14 point system review is negative.   PHYSICAL EXAM:   VS:  BP (!) 144/86   Pulse (!) 51   Temp (!) 96.9 F (36.1 C)   Ht _0  (1.753 m)   Wt 200 lb (90.7 kg)   SpO2 97%   BMI 29.53 kg/m     Repeat blood pressure 120/76 , Wt Readings from Last 3 Encounters:  02/14/19 200 lb (90.7 kg)  01/26/19 198 lb (89.8 kg)  01/24/19 209 lb (94.8 kg)    General: Alert, oriented, no distress.  Skin: normal turgor, no rashes, warm and dry HEENT: Normocephalic, atraumatic. Pupils equal round and reactive to light; sclera anicteric; extraocular muscles intact;  Nose without nasal septal hypertrophy Mouth/Parynx benign; Mallinpatti scale 3 Neck: No JVD, no carotid bruits; normal carotid upstroke Lungs: clear to ausculatation and percussion; no wheezing or rales Chest wall: without tenderness to palpitation Heart: PMI not displaced, regular rhythm, bradycardic with heart rates in the 50s, asymptomatic, s1 s2 normal, 1/6 systolic murmur, no diastolic murmur, no  rubs, gallops, thrills, or heaves Abdomen: soft, nontender; no hepatosplenomehaly, BS+; abdominal aorta nontender and not dilated by palpation. Back: no CVA tenderness Pulses 2+ Musculoskeletal: full range of motion, normal strength, no joint deformities Extremities: no clubbing cyanosis or edema, Homan's sign negative  Neurologic: grossly nonfocal; Cranial nerves grossly wnl Psychologic: Normal mood and affect   Studies/Labs Reviewed:   ECG (independently read by me): Sinus Bradycardia at 51; no STT changes; QTc 442 msec; no ectopy  January 16, 2019 ECG (independently read by me): Sinus bradycardia at 57 bpm with possible left atrial enlargement.  PR interval 176 ms, QTc interval 476 ms.  No ST segment changes.  January 02, 2019 ECG (independently read by me): Atrial flutter with 2-1 AV conduction with a ventricular rate of 130 bpm.  December 21, 2018 ECG (independently read by me): Atrial flutter with variable block at 115, QT 402,QTC 556 msec  Cardioversion November 20, 2018 with restoration of sinus rhythm.  November 15, 2018 ECG (independently read by me): Atrial Flutter with variable block at 97 bpm  September 2020 ECG (independently read by me): Atrial flutter with variable block with an average heart rate at 115 bpm  October 29, 2018 ECG (independently read by me): Atrial flutter with variable block 130 bpm.  No specific ST-T changes  October 08, 2018 ECG (independently read by me): NSR at 60; isolated PVC; QTc 434 msec.  April 02, 2018 ECG (independently read by me): NSR 62; Nonspecific T change; QTc 452 msec  December 29, 2017 ECG (independently read  by me): Sinus bradycardia at 49 bpm.  Nonspecific ST changes.  QTc interval 464 ms.  No ectopy.  ECG (independently read by me): Sinus rhythm at 64 bpm.  Right bundle branch block with repolarization changes.  No ectopy.  October 24, 2017 ECG (independently read by me): Normal sinus rhythm at 62 bpm.  Right bundle branch  block with repolarization changes.   ECG  April 18, 2014 revealed normal sinus rhythm.  Right bundle branch block was not present.  Recent Labs: BMP Latest Ref Rng & Units 01/16/2019 01/10/2019 11/16/2018  Glucose 65 - 99 mg/dL 79 116(H) 103(H)  BUN 8 - 27 mg/dL _0 Creatinine 0.76 - 1.27 mg/dL 0.75(L) 0.90 0.87  BUN/Creat Ratio 10 - _1 Sodium 134 - 144 mmol/L 139 138 138  Potassium 3.5 - 5.2 mmol/L 4.4 4.8 4.8  Chloride 96 - 106 mmol/L 102 101 100  CO2 20 - 29 mmol/L _2 Calcium 8.6 - 10.2 mg/dL 8.7 9.4 9.2     Hepatic Function Latest Ref Rng & Units 11/16/2018 10/05/2018 04/03/2018  Total Protein 6.0 - 8.5 g/dL 6.6 6.7 6.8  Albumin 3.7 - 4.7 g/dL 4.1 4.6 4.3  AST 0 - 40 IU/L 37 46(H) 32  ALT 0 - 44 IU/L 41 49(H) 32  Alk Phosphatase 39 - 117 IU/L 124(H) 91 183(H)  Total Bilirubin 0.0 - 1.2 mg/dL 0.4 0.4 0.4  Bilirubin, Direct 0.00 - 0.40 mg/dL - - -    CBC Latest Ref Rng & Units 01/16/2019 01/10/2019 11/16/2018  WBC 3.4 - 10.8 x10E3/uL 6.4 7.7 7.8  Hemoglobin 13.0 - 17.7 g/dL 14.3 15.4 14.0  Hematocrit 37.5 - 51.0 % 41.9 45.2 40.2  Platelets 150 - 450 x10E3/uL 159 204 211   Lab Results  Component Value Date   MCV 95 01/16/2019   MCV 95 01/10/2019   MCV 96 11/16/2018   Lab Results  Component Value Date   TSH 4.050 01/16/2019   Lab Results  Component Value Date   HGBA1C 5.4 11/15/2017     BNP    Component Value Date/Time   BNP 131.4 (H) 10/05/2018 1010    ProBNP No results found for: PROBNP   Lipid Panel     Component Value Date/Time   CHOL 143 10/05/2018 1010   TRIG 142 10/05/2018 1010   HDL 48 10/05/2018 1010   CHOLHDL 3.0 10/05/2018 1010   CHOLHDL 3.4 01/29/2007 0410   VLDL 17 01/29/2007 0410   LDLCALC 67 10/05/2018 1010     RADIOLOGY: DG Chest 2 View  Result Date: 01/22/2019 CLINICAL DATA:  Shortness of breath. EXAM: CHEST - 2 VIEW COMPARISON:  12/14/2017. FINDINGS: Prior CABG. Cardiomegaly. No pulmonary venous congestion.  No focal infiltrate. No pleural effusion or pneumothorax. Degenerative changes scoliosis thoracic spine. Prior cervical spine fusion. Old left rib fractures noted. IMPRESSION: 1.  Prior CABG.  Stable cardiomegaly. 2.  No acute pulmonary disease.  Chest is stable from 12/14/2017. Electronically Signed   By: Marcello Moores  Register   On: 01/22/2019 06:43   Myocardial Perfusion Imaging  Result Date: 01/24/2019  The left ventricular ejection fraction is normal (55-65%).  Nuclear stress EF: 60%.  There was no ST segment deviation noted during stress.  No T wave inversion was noted during stress.  The study is normal.  This is a low risk study.  1.  Normal myocardial perfusion imaging study without evidence of ischemia or infarction. 2.  Normal left ventricular ejection  fraction, 60%. 3.  Abnormal septal motion consistent with a postoperative state. 4.  This is a low risk study. [place] T. Audie Box, [place] [address], [place], [place] [address] [phone] 4:50 PM  ECHOCARDIOGRAM COMPLETE  Result Date: 01/23/2019   ECHOCARDIOGRAM REPORT   Patient Name:   Carl [name]   Date of Exam: 01/23/2019 Medical Rec #:  [id number]     Height:       69.0 in Accession #:    [id number]    Weight:       209.8 lb Date of Birth:  04-12-1945      BSA:          2.11 m Patient Age:    60 years      BP:           155/89 mmHg Patient Gender: M             HR:           55 bpm. Exam Location:  [facility] Procedure: 2D Echo, Cardiac Doppler, Color Doppler and Intracardiac            Opacification Agent Indications:    Z95.1 s/p CABG; R06.02 SOB  History:        Patient has prior history of Echocardiogram examinations, most                 recent 11/16/2017. CAD, Prior CABG, Arrythmias:Atrial Flutter;                 Risk Factors:Hypertension and Dyslipidemia.  Sonographer:    Diamond Nickel RCS Referring Phys: [place]  1. Left ventricular ejection fraction, by visual  estimation, is 60 to 65%. The left ventricle has normal function. There is mildly increased left ventricular hypertrophy.  2. Abnormal septal motion consistent with post-operative status.  3. Left ventricular diastolic parameters are consistent with Grade II diastolic dysfunction (pseudonormalization).  4. The left ventricle has no regional wall motion abnormalities.  5. Definity contrast used due to poor acoustic windows.  6. Global right ventricle has normal systolic function.The right ventricular size is normal. No increase in right ventricular wall thickness.  7. Left atrial size was severely dilated.  8. Right atrial size was severely dilated.  9. The mitral valve is normal in structure. Trivial mitral valve regurgitation. 10. The tricuspid valve is normal in structure. Tricuspid valve regurgitation is trivial. 11. The aortic valve is normal in structure. Aortic valve regurgitation is trivial. Mild to moderate aortic valve sclerosis/calcification without any evidence of aortic stenosis. 12. The pulmonic valve was grossly normal. Pulmonic valve regurgitation is trivial. 13. Mildly elevated pulmonary artery systolic pressure. FINDINGS  Left Ventricle: Left ventricular ejection fraction, by visual estimation, is 60 to 65%. The left ventricle has normal function. The left ventricle has no regional wall motion abnormalities. There is mildly increased left ventricular hypertrophy. Abnormal (paradoxical) septal motion consistent with post-operative status. Left ventricular diastolic parameters are consistent with Grade II diastolic dysfunction (pseudonormalization). Definity contrast used due to poor acoustic windows. Right Ventricle: The right ventricular size is normal. No increase in right ventricular wall thickness. Global RV systolic function is has normal systolic function. The tricuspid regurgitant velocity is 2.56 m/s, and with an assumed right atrial pressure  of 10 mmHg, the estimated right ventricular  systolic pressure is mildly elevated at 36.1 mmHg. Left Atrium: Left atrial size was severely dilated. Right Atrium: Right atrial size  was severely dilated Pericardium: There is no evidence of pericardial effusion. Mitral Valve: The mitral valve is normal in structure. Trivial mitral valve regurgitation. Tricuspid Valve: The tricuspid valve is normal in structure. Tricuspid valve regurgitation is trivial. Aortic Valve: The aortic valve is normal in structure. Aortic valve regurgitation is trivial. Aortic regurgitation PHT measures 722 msec. Mild to moderate aortic valve sclerosis/calcification is present, without any evidence of aortic stenosis. Pulmonic Valve: The pulmonic valve was grossly normal. Pulmonic valve regurgitation is trivial. Pulmonic regurgitation is trivial. Aorta: The aortic root and ascending aorta are structurally normal, with no evidence of dilitation. IAS/Shunts: No atrial level shunt detected by color flow Doppler.  LEFT VENTRICLE PLAX 2D LVIDd:         4.14 cm  Diastology LVIDs:         2.90 cm  LV e' lateral:   12.60 cm/s LV PW:         1.18 cm  LV E/e' lateral: 6.2 LV IVS:        1.41 cm  LV e' medial:    9.79 cm/s LVOT diam:     1.95 cm  LV E/e' medial:  7.9 LV SV:         44 ml LV SV Index:   20.06 LVOT Area:     2.99 cm  RIGHT VENTRICLE RV Basal diam:  3.69 cm RV S prime:     9.79 cm/s TAPSE (M-mode): 1.6 cm LEFT ATRIUM              Index       RIGHT ATRIUM           Index LA diam:        4.50 cm  2.13 cm/m  RA Area:     28.00 cm LA Vol (A2C):   101.0 ml 47.90 ml/m RA Volume:   95.40 ml  45.25 ml/m LA Vol (A4C):   130.0 ml 61.66 ml/m LA Biplane Vol: 120.0 ml 56.91 ml/m  AORTIC VALVE LVOT Vmax:   112.00 cm/s LVOT Vmean:  74.000 cm/s LVOT VTI:    0.243 m AI PHT:      722 msec  AORTA Ao Root diam: 3.20 cm MITRAL VALVE                        TRICUSPID VALVE MV Area (PHT): 2.48 cm             TR Peak grad:   26.1 mmHg MV PHT:        88.74 msec           TR Vmax:        281.00 cm/s MV  Decel Time: 306 msec MV E velocity: 77.60 cm/s 103 cm/s  SHUNTS MV A velocity: 68.90 cm/s 70.3 cm/s Systemic VTI:  0.24 m MV E/A ratio:  1.13       1.5       Systemic Diam: 1.95 cm  Glori Bickers MD Electronically signed by Glori Bickers MD Signature Date/Time: 01/23/2019/6:41:49 PM    Final    SLEEP STUDY DOCUMENTS  Result Date: 02/05/2019 Ordered by an unspecified provider.    Additional studies/ records that were reviewed today include:  I reviewed his prior office visits and most recent DC cardioversion.  CONCLUSIONS:  1. Isthmus-dependent right atrial flutter upon presentation.  2. Successful radiofrequency ablation of atrial flutter along the cavotricuspid isthmus with complete bidirectional isthmus block achieved.  3. No inducible arrhythmias  following ablation.  4. No early apparent complications.   Cristopher Peru, MD  12:37 PM 01/11/2019   SPLIT NIGHT SLEEP STUDY: 01/25/2019 SLEEP STUDY TECHNIQUE As per the AASM Manual for the Scoring of Sleep and Associated Events v2.3 (April 2016) with a hypopnea requiring 4% desaturations.  The channels recorded and monitored were frontal, central and occipital EEG, electrooculogram (EOG), submentalis EMG (chin), nasal and oral airflow, thoracic and abdominal wall motion, anterior tibialis EMG, snore microphone, electrocardiogram, and pulse oximetry. Bi-level positive airway pressure (BiPAP) was initiated when the patient met split night criteria and was titrated according to treat sleep-disordered breathing.  RESPIRATORY PARAMETERS Diagnostic Total AHI (/hr):            63.5     RDI (/hr):         65.8     OA Index (/hr):            17.7     CA Index (/hr):      5.9 REM AHI (/hr):            N/A      NREM AHI (/hr):          63.5     Supine AHI (/hr):         63.5     Non-supine AHI (/hr):        N/A Min O2 Sat (%):          81.0     Mean O2 (%):  93.0     Time below 88% (min):           18.7       Titration Optimal IPAP  Pressure (cm): 18        Optimal EPAP Pressure (cm):            14        AHI at Optimal Pressure (/hr):          3.6       Min O2 at Optimal Pressure (%):       93.0 Sleep % at Optimal (%):         97        Supine % at Optimal (%):       7            SLEEP ARCHITECTURE The study was initiated at 10:10:34 PM and terminated at 4:34:09 AM. The total recorded time was 383.6 minutes. EEG confirmed total sleep time was 333.3 minutes yielding a sleep efficiency of 86.9%%. Sleep onset after lights out was 0.8 minutes with a REM latency of 195.0 minutes. The patient spent 1.5%% of the night in stage N1 sleep, 80.9%% in stage N2 sleep, 0.0%% in stage N3 and 17.6% in REM. Wake after sleep onset (WASO) was 49.5 minutes. The Arousal Index was 32.0/hour.  LEG MOVEMENT DATA The total Periodic Limb Movements of Sleep (PLMS) were 0. The PLMS index was 0.0 .  CARDIAC DATA The 2 lead EKG demonstrated sinus rhythm. The mean heart rate was 100.0 beats per minute. Other EKG findings include: None.  IMPRESSIONS - Severe obstructive sleep apnea occurred during the diagnostic portion of the study (AHI 63.5 /h; RDI 65.8/h). CPAP was initiated at 5 cm and was titrated to 9 cm . Due to frequent central events, BiPAP was implemented at 11/7 and was titrated to 18/14 cm of water: AHI 3.6/h, O2 nadir 93%. - Mild central sleep apnea occurred during the diagnostic portion of the study (CAI =  5.9/hour). - Moderate oxygen desaturation was noted during the diagnostic portion of the study (Min O2 = 81.0%). - The patient snored with loud snoring volume during the diagnostic portion of the study. - It appears that the patient was in atrial fibrillation throughtout the study. - Clinically significant periodic limb movements of sleep did not occur during the study.  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 A Small size Fisher[employer]Paykel Full Face Mask Simplus mask was used for the titration. If patient continues to experience central events, ASV titration may be necessary. - Effort should be made to optimize nasal and oropharyngeal patency. - Avoid alcohol, sedatives and other CNS depressants that may worsen sleep apnea and disrupt normal sleep architecture. - Sleep hygiene should be reviewed to assess factors that may improve sleep quality. - Weight management and regular exercise should be initiated or continued. - Recoomend f/u cardiology evalution for probable recurrent atrial fibrillation.  - Recommend a download in 30 days and sleep clinic evaluation after 30 days of therapy.  [Electronically signed] 01/27/2019 04:13 PM  Shelva Majestic MD, [facility], [place], American Board of Sleep Medicine   ASSESSMENT:    1. CAD in native artery   2. S/P CABG x 4   3. Typical atrial flutter ([place]): s/p ablation 01/11/2019   4. PAF (paroxysmal atrial fibrillation) ([place])   5. OSA treated with BiPAP   6. Hypothyroidism, unspecified type   7. Hyperlipidemia LDL goal <70   8. Anticoagulated    PLAN:  Mr. 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  He has a history of mild hyperlipidemia and remotely had been on simvastatin 40 mg daily and has been followed by Dr. Hulan Fess.  In the fall 2019 he began to notice mild shortness of breath particularly with walking up steps.  An echo Doppler study revealed hyperdynamic LV function with an EF of 65 to 70% but with grade 2 diastolic dysfunction and abnormal tissue Doppler.  A CT coronary angiogram was suggestive of significant multivessel CAD and was FFR positive.  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 Roxy Horseman.  His postoperative course  was complicated by paroxysmal atrial fibrillation for which he was started on amiodarone and ultimately was discharged on Eliquis.  When I saw him for initial post hospital evaluation he was still on amiodarone 200 mg twice a day, metoprolol tartrate 25 mg twice a day.  His ECG showed sinus bradycardia 5 weeks status post CABG revascularization; amiodarone was decreased to 200 mg daily and subsequently discontinued when subsequent laboratory showed LFT elevation and further increase in his TSH level.  With his hypothyroidism he required increasing doses of levothyroxine, ultimately titrated to his present dose of 150 mcg daily.  He had developed episodes of atrial flutter and underwent initial cardioversion in October 2020.  Due to recurrent atrial flutter he was ultimately referred to Dr. Lovena Le and underwent successful a flutter ablation on January 12, 2019.  Due to concerns for obstructive sleep apnea he ultimately was able to have his sleep study which confirmed my suspicion.  He was found to have severe sleep apnea with an AHI of over 60 times per hour with significant  oxygen desaturation to a nadir of 80%.  He has been on BiPAP therapy for 9 days and already notes marked improvement.  Of note he had run out of his metoprolol 2 days prior to his sleep study and he was back in atrial fibrillation during his sleep evaluation.  His ECG today back on metoprolol 75 mg twice a day demonstrates sinus rhythm with asymptomatic sinus bradycardia 51 bpm.  Depending upon his heart rate, the and with continued treatment of his BiPAP therapy it may be possible in the future if necessary to reduce metoprolol down to 50 mg twice a day.  I thoroughly reviewed his split-night sleep study with him in detail today.  He already has noticed marked benefit in his previous significant fatigability.  I am recommending slight changes and will adjust his initial ramp pressure to start at 6 rather than 4.  I will change his ramp time to  20 minutes.  Rather than a fixed 18/14 pressure I will change him to an auto mode with initial EPAP minimum of 12 and IPAP maximum of 22 with pressure support of 4.  I feel some of his mask leak may be due to the fact that he is tightening his mask too tightly which is a ResMed air fit F 30i.  I will obtain another download in 1 month for reassessment and will need to see him prior to 90 days following CPAP initiation form compliance mandate per Medicare.  I reviewed his most recent echo Doppler study which continues to show normal systolic function.  His shortness of breath has improved.  His most recent [place] study was low risk without any areas of ischemia now 14 months status post revascularization surgery.  I will see him in March for follow-up cardiology/sleep evaluation.   Medication Adjustments/Labs and Tests Ordered: Current medicines are reviewed at length with the patient today.  Concerns regarding medicines are outlined above.  Medication changes, Labs and Tests ordered today are listed in the Patient Instructions below. Patient Instructions  Medication Instructions:  NO CHANGES *If you need a refill on your cardiac medications before your next appointment, please call your pharmacy*  Lab Work: If you have labs (blood work) drawn today and your tests are completely normal, you will receive your results only by: Marland Kitchen MyChart Message (if you have MyChart) OR . A paper copy in the mail If you have any lab test that is abnormal or we need to change your treatment, we will call you to review the results.   Follow-Up: At [facility], you and your health needs are our priority.  As part of our continuing mission to provide you with exceptional heart care, we have created designated Provider Care Teams.  These Care Teams include your primary Cardiologist (physician) and Advanced Practice Providers (APPs -  Physician Assistants and Nurse Practitioners) who all work together to provide you with  the care you need, when you need it.  Your next appointment:   2 month(s) KEEP YOUR FOLLOW UP AS SCHEDULED WITH THE SLEEP CLINIC  The format for your next appointment:   In Person  Provider:   Shelva Majestic, MD      Signed, Shelva Majestic, MD  02/16/2019 3:23 PM    Time [address], [place], [place], [place]  [address] Phone: [phone]

## 2019-02-14 NOTE — Patient Instructions (Signed)
Medication Instructions:  NO CHANGES *If you need a refill on your cardiac medications before your next appointment, please call your pharmacy*  Lab Work: If you have labs (blood work) drawn today and your tests are completely normal, you will receive your results only by: Marland Kitchen MyChart Message (if you have MyChart) OR . A paper copy in the mail If you have any lab test that is abnormal or we need to change your treatment, we will call you to review the results.   Follow-Up: At [facility], you and your health needs are our priority.  As part of our continuing mission to provide you with exceptional heart care, we have created designated Provider Care Teams.  These Care Teams include your primary Cardiologist (physician) and Advanced Practice Providers (APPs -  Physician Assistants and Nurse Practitioners) who all work together to provide you with the care you need, when you need it.  Your next appointment:   2 month(s) KEEP YOUR FOLLOW UP AS SCHEDULED WITH THE SLEEP CLINIC  The format for your next appointment:   In Person  Provider:   Shelva Majestic, MD

## 2019-02-16 ENCOUNTER — Encounter: Payer: Self-pay | Admitting: Cardiovascular Disease

## 2019-02-19 ENCOUNTER — Ambulatory Visit: Payer: PPO | Admitting: Internal Medicine

## 2019-02-19 ENCOUNTER — Encounter: Payer: Self-pay | Admitting: Internal Medicine

## 2019-02-19 ENCOUNTER — Other Ambulatory Visit: Payer: Self-pay

## 2019-02-19 VITALS — BP 120/68 | HR 53 | Ht 69.0 in | Wt 201.4 lb

## 2019-02-19 DIAGNOSIS — I483 Typical atrial flutter: Secondary | ICD-10-CM | POA: Diagnosis not present

## 2019-02-19 NOTE — Progress Notes (Signed)
HPI Carl [name] returns today for followup. He is a pleasant 74 yo man with a h/o CAD, s/p CABG, atrial flutter s/p ablation and recent diagnosis of sleep apnea. He may have had some brief episodes of atrial fib since his ablation. He denies chest pain or sob and feels much better since his initiation of CPAP. He denies syncope.  No Known Allergies   Current Outpatient Medications  Medication Sig Dispense Refill  . acetaminophen (TYLENOL) 500 MG tablet Take 1,000 mg by mouth every 6 (six) hours as needed for moderate pain or headache.    Marland Kitchen acyclovir (ZOVIRAX) 400 MG tablet Take 400 mg by mouth 2 (two) times daily.    Marland Kitchen aspirin EC 81 MG tablet Take 1 tablet (81 mg total) by mouth daily. 90 tablet 3  . citalopram (CELEXA) 20 MG tablet Take 20 mg by mouth daily.    . ferrous sulfate 325 (65 FE) MG tablet Take 325 mg by mouth daily.    . Glucosamine HCl (GLUCOSAMINE PO) Take 1 tablet by mouth 2 (two) times daily.    Vanessa Kick Ethyl (VASCEPA) 1 g CAPS Take 2 capsules (2 g total) by mouth 2 (two) times daily. 360 capsule 1  . levothyroxine (SYNTHROID) 150 MCG tablet Take 1 tablet (150 mcg total) by mouth daily. 90 tablet 1  . metoprolol tartrate (LOPRESSOR) 50 MG tablet Take 1.5 tablets (75 mg total) by mouth 2 (two) times daily. 180 tablet 3  . Multiple Vitamins-Minerals (PRESERVISION AREDS 2 PO) Take 1 tablet by mouth 2 (two) times daily.    . pantoprazole (PROTONIX) 20 MG tablet TAKE 2 TABLETS(40 MG) BY MOUTH DAILY 180 tablet 3  . rosuvastatin (CRESTOR) 20 MG tablet Take 1 tablet (20 mg total) by mouth daily. 90 tablet 2  . zolpidem (AMBIEN) 10 MG tablet Take 10 mg by mouth at bedtime.     Marland Kitchen apixaban (ELIQUIS) 5 MG TABS tablet Take 1 tablet (5 mg total) by mouth 2 (two) times daily. 180 tablet 1  . hydrochlorothiazide (MICROZIDE) 12.5 MG capsule Take 1 capsule (12.5 mg total) by mouth as needed (swelling). 30 capsule 3  . sildenafil (REVATIO) 20 MG tablet Take 60 mg by mouth daily as  needed for erectile dysfunction.     No current facility-administered medications for this visit.     Past Medical History:  Diagnosis Date  . Anxiety    takes Xanax daily as needed  . Arthritis   . Cataract    removed both eyes  . Depression    takes Celexa daily  . Diverticulitis   . Diverticulosis   . Enlarged prostate    sightly  . Esophagitis   . Gastric ulcer   . GERD (gastroesophageal reflux disease)    takes Protonix daily  . Hx of adenomatous colonic polyps    benign  . Hyperlipidemia    takes Zocor daily  . Hypertension   . Insomnia    takes Ambien nightly  . Joint pain   . Joint swelling     ROS:   All systems reviewed and negative except as noted in the HPI.   Past Surgical History:  Procedure Laterality Date  . A-FLUTTER ABLATION N/A 01/11/2019   Procedure: A-FLUTTER ABLATION;  Surgeon: Evans Lance, MD;  Location: [facility];  Service: Cardiovascular;  Laterality: N/A;  . CARDIAC CATHETERIZATION    . CARDIOVERSION N/A 11/20/2018   Procedure: CARDIOVERSION;  Surgeon: Troy Sine, MD;  Location: [facility];  Service: Cardiovascular;  Laterality: N/A;  . cartliage removed from nose  17yrs ago  . cataract surgery Bilateral   . CERVICAL SPINE SURGERY    . COLONOSCOPY    . CORONARY ARTERY BYPASS GRAFT N/A 11/16/2017   Procedure: CORONARY ARTERY BYPASS GRAFTING (CABG) x4. LEFT ENDOSCOPIC SAPHENOUS VEIN HARVEST AND MAMMARY ARTERY TAKE DOWN. LIMA TO LAD, SVG TO PDA, SVG TO DISTAL CIRC & OMI.;  Surgeon: [name], MD;  Location: [facility];  Service: Open Heart Surgery;  Laterality: N/A;  . FOREIGN BODY REMOVAL ESOPHAGEAL    . KNEE SURGERY Left    couple of times  . LEFT HEART CATH AND CORONARY ANGIOGRAPHY N/A 11/15/2017   Procedure: LEFT HEART CATH AND CORONARY ANGIOGRAPHY;  Surgeon: Troy Sine, MD;  Location: [facility];  Service: Cardiovascular;  Laterality: N/A;  . NASAL SINUS SURGERY    . POLYPECTOMY    . TEE WITHOUT  CARDIOVERSION N/A 11/16/2017   Procedure: TRANSESOPHAGEAL ECHOCARDIOGRAM (TEE);  Surgeon: [name], MD;  Location: [facility];  Service: Open Heart Surgery;  Laterality: N/A;  . TONSILLECTOMY    . TOTAL KNEE ARTHROPLASTY Left 06/17/2014   Procedure: TOTAL KNEE ARTHROPLASTY;  Surgeon: Garald Balding, MD;  Location: [facility];  Service: Orthopedics;  Laterality: Left;     Family History  Problem Relation Age of Onset  . Colon cancer Paternal Grandfather        questionable  . Colon polyps Neg Hx   . Esophageal cancer Neg Hx   . Rectal cancer Neg Hx   . Stomach cancer Neg Hx      Social History   Socioeconomic History  . Marital status: Single    Spouse name: Not on file  . Number of children: 3  . Years of education: Not on file  . Highest education level: Not on file  Occupational History  . Occupation: Owner  Tobacco Use  . Smoking status: Never Smoker  . Smokeless tobacco: Never Used  Substance and Sexual Activity  . Alcohol use: Yes    Alcohol/week: 10.0 standard drinks    Types: 10 Standard drinks or equivalent per week    Comment: 2 drinks 5 nights per week  . Drug use: No  . Sexual activity: Never  Other Topics Concern  . Not on file  Social History Narrative  . Not on file   Social Determinants of Health   Financial Resource Strain:   . Difficulty of Paying Living Expenses: Not on file  Food Insecurity:   . Worried About Charity fundraiser in the Last Year: Not on file  . Ran Out of Food in the Last Year: Not on file  Transportation Needs:   . Lack of Transportation (Medical): Not on file  . Lack of Transportation (Non-Medical): Not on file  Physical Activity:   . Days of Exercise per Week: Not on file  . Minutes of Exercise per Session: Not on file  Stress:   . Feeling of Stress : Not on file  Social Connections:   . Frequency of Communication with Friends and Family: Not on file  . Frequency of Social Gatherings with Friends and Family: Not on  file  . Attends Religious Services: Not on file  . Active Member of Clubs or Organizations: Not on file  . Attends Archivist Meetings: Not on file  . Marital Status: Not on file  Intimate Partner Violence:   . Fear  of Current or Ex-Partner: Not on file  . Emotionally Abused: Not on file  . Physically Abused: Not on file  . Sexually Abused: Not on file     BP 120/68   Pulse (!) 53   Ht 5\' 9"  (1.753 m)   Wt 201 lb 6.4 oz (91.4 kg)   SpO2 97%   BMI 29.74 kg/m   Physical Exam:  Well appearing NAD HEENT: Unremarkable Neck:  No JVD, no thyromegally Lymphatics:  No adenopathy Back:  No CVA tenderness Lungs:  Clear with no wheezes HEART:  Regular rate rhythm, no murmurs, no rubs, no clicks Abd:  soft, positive bowel sounds, no organomegally, no rebound, no guarding Ext:  2 plus pulses, no edema, no cyanosis, no clubbing Skin:  No rashes no nodules Neuro:  CN II through XII intact, motor grossly intact  EKG none  Assess/Plan: 1. Atrial flutter - he is maintaining NSR since his catheter ablation. He will undergo watchful waiting.  2. Atrial fib - he has not had any recently. Unclear if he will have more and for this reason I would continue his blood thinner unless he starts bleeding. 3. CAD - he denies anginal symptoms. No change.  4. Sinus node dysfunction - he is asymptomatic. I asked him to decrease his dose of metoprolol to 50 bid.  Mikle [email or web address].D.

## 2019-02-19 NOTE — Patient Instructions (Addendum)
Medication Instructions:  Your physician recommends that you continue on your current medications as directed. Please refer to the Current Medication list given to you today.  Labwork: None ordered.  Testing/Procedures: None ordered.  Follow-Up: Your physician wants you to follow-up in: as needed with Dr. Taylor.      Any Other Special Instructions Will Be Listed Below (If Applicable).  If you need a refill on your cardiac medications before your next appointment, please call your pharmacy.   

## 2019-03-03 DIAGNOSIS — E032 Hypothyroidism due to medicaments and other exogenous substances: Secondary | ICD-10-CM | POA: Diagnosis not present

## 2019-03-03 DIAGNOSIS — G459 Transient cerebral ischemic attack, unspecified: Secondary | ICD-10-CM | POA: Diagnosis not present

## 2019-03-03 DIAGNOSIS — H409 Unspecified glaucoma: Secondary | ICD-10-CM | POA: Diagnosis not present

## 2019-03-03 DIAGNOSIS — E785 Hyperlipidemia, unspecified: Secondary | ICD-10-CM | POA: Diagnosis not present

## 2019-03-03 DIAGNOSIS — I480 Paroxysmal atrial fibrillation: Secondary | ICD-10-CM | POA: Diagnosis not present

## 2019-03-03 DIAGNOSIS — E782 Mixed hyperlipidemia: Secondary | ICD-10-CM | POA: Diagnosis not present

## 2019-03-03 DIAGNOSIS — I10 Essential (primary) hypertension: Secondary | ICD-10-CM | POA: Diagnosis not present

## 2019-03-03 DIAGNOSIS — I2510 Atherosclerotic heart disease of native coronary artery without angina pectoris: Secondary | ICD-10-CM | POA: Diagnosis not present

## 2019-03-07 DIAGNOSIS — G4733 Obstructive sleep apnea (adult) (pediatric): Secondary | ICD-10-CM | POA: Diagnosis not present

## 2019-03-11 ENCOUNTER — Ambulatory Visit: Payer: PPO | Admitting: Internal Medicine

## 2019-03-29 DIAGNOSIS — Z86010 Personal history of colonic polyps: Secondary | ICD-10-CM | POA: Diagnosis not present

## 2019-03-29 DIAGNOSIS — G4700 Insomnia, unspecified: Secondary | ICD-10-CM | POA: Diagnosis not present

## 2019-03-29 DIAGNOSIS — I480 Paroxysmal atrial fibrillation: Secondary | ICD-10-CM | POA: Diagnosis not present

## 2019-03-29 DIAGNOSIS — F419 Anxiety disorder, unspecified: Secondary | ICD-10-CM | POA: Diagnosis not present

## 2019-03-29 DIAGNOSIS — R7401 Elevation of levels of liver transaminase levels: Secondary | ICD-10-CM | POA: Diagnosis not present

## 2019-03-29 DIAGNOSIS — E785 Hyperlipidemia, unspecified: Secondary | ICD-10-CM | POA: Diagnosis not present

## 2019-03-29 DIAGNOSIS — I2510 Atherosclerotic heart disease of native coronary artery without angina pectoris: Secondary | ICD-10-CM | POA: Diagnosis not present

## 2019-03-29 DIAGNOSIS — Z79899 Other long term (current) drug therapy: Secondary | ICD-10-CM | POA: Diagnosis not present

## 2019-03-29 DIAGNOSIS — Z951 Presence of aortocoronary bypass graft: Secondary | ICD-10-CM | POA: Diagnosis not present

## 2019-03-29 DIAGNOSIS — I10 Essential (primary) hypertension: Secondary | ICD-10-CM | POA: Diagnosis not present

## 2019-03-29 DIAGNOSIS — E032 Hypothyroidism due to medicaments and other exogenous substances: Secondary | ICD-10-CM | POA: Diagnosis not present

## 2019-03-29 DIAGNOSIS — Z8673 Personal history of transient ischemic attack (TIA), and cerebral infarction without residual deficits: Secondary | ICD-10-CM | POA: Diagnosis not present

## 2019-03-29 DIAGNOSIS — R899 Unspecified abnormal finding in specimens from other organs, systems and tissues: Secondary | ICD-10-CM | POA: Diagnosis not present

## 2019-03-29 DIAGNOSIS — E782 Mixed hyperlipidemia: Secondary | ICD-10-CM | POA: Diagnosis not present

## 2019-03-29 DIAGNOSIS — R3129 Other microscopic hematuria: Secondary | ICD-10-CM | POA: Diagnosis not present

## 2019-04-05 DIAGNOSIS — G4733 Obstructive sleep apnea (adult) (pediatric): Secondary | ICD-10-CM | POA: Diagnosis not present

## 2019-04-07 DIAGNOSIS — G4733 Obstructive sleep apnea (adult) (pediatric): Secondary | ICD-10-CM | POA: Diagnosis not present

## 2019-04-08 ENCOUNTER — Other Ambulatory Visit: Payer: Self-pay | Admitting: Cardiovascular Disease

## 2019-04-22 ENCOUNTER — Other Ambulatory Visit: Payer: Self-pay | Admitting: Cardiovascular Disease

## 2019-04-25 ENCOUNTER — Ambulatory Visit: Payer: PPO | Admitting: Cardiovascular Disease

## 2019-04-25 ENCOUNTER — Encounter: Payer: Self-pay | Admitting: Cardiovascular Disease

## 2019-04-25 ENCOUNTER — Other Ambulatory Visit: Payer: Self-pay

## 2019-04-25 VITALS — BP 128/62 | HR 49 | Ht 69.0 in | Wt 195.0 lb

## 2019-04-25 DIAGNOSIS — Z9889 Other specified postprocedural states: Secondary | ICD-10-CM | POA: Diagnosis not present

## 2019-04-25 DIAGNOSIS — I480 Paroxysmal atrial fibrillation: Secondary | ICD-10-CM | POA: Diagnosis not present

## 2019-04-25 DIAGNOSIS — Z951 Presence of aortocoronary bypass graft: Secondary | ICD-10-CM | POA: Diagnosis not present

## 2019-04-25 DIAGNOSIS — Z7901 Long term (current) use of anticoagulants: Secondary | ICD-10-CM | POA: Diagnosis not present

## 2019-04-25 DIAGNOSIS — G4733 Obstructive sleep apnea (adult) (pediatric): Secondary | ICD-10-CM | POA: Diagnosis not present

## 2019-04-25 DIAGNOSIS — E039 Hypothyroidism, unspecified: Secondary | ICD-10-CM

## 2019-04-25 DIAGNOSIS — Z8679 Personal history of other diseases of the circulatory system: Secondary | ICD-10-CM

## 2019-04-25 DIAGNOSIS — E785 Hyperlipidemia, unspecified: Secondary | ICD-10-CM

## 2019-04-25 DIAGNOSIS — I2510 Atherosclerotic heart disease of native coronary artery without angina pectoris: Secondary | ICD-10-CM

## 2019-04-25 MED ORDER — METOPROLOL TARTRATE 50 MG PO TABS: ORAL_TABLET | ORAL | 3 refills | Status: DC

## 2019-04-25 NOTE — Progress Notes (Signed)
Cardiology Office Note    Date:  04/27/2019   ID:  CAMDYN BESKE, DOB [birth year], MRN [id number]  PCP:  Hulan Fess, MD  Cardiologist:  Shelva Majestic, MD    History of Present Illness:  WADDELL ITEN is a 74 y.o. male who presents to the office today for a follow-up evaluation of his atrial flutter ablation and sleep study with BiPAP initiation.  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  In 2009 he developed transient numbness of his right face which lasted for several minutes and at that time apparently underwent evaluation and had a normal MRI, MRA, and carotid duplex studies.  He was found to have only mild to moderate plaque in the proximal right internal carotid artery without stenosis.  I had seen him in March 2016 for preoperative cardiology clearance prior to undergoing left knee replacement surgery by Dr. Durward Fortes.  Preoperative ECG suggested possible junctional rhythm which was new from an ECG of 2012 which previously had only shown sinus bradycardia.  He was asymptomatic.  When I saw him, his ECG showed sinus rhythm at 61 bpm with normal intervals and on that ECG P waves were normal and upright inferiorly.  Waseem has remained fairly active. In 2019  he had not been exercising as much as he had in the past still working out with a trainer and denied any associated chest pain or palpitations.  He in September 201 19 he began to notice more shortness of breath with walking up steps.  He was  at a friend's house and was told that he appeared to be more short of breath than previously.  I saw him for evaluation of his exertional shortness of breath on October 24, 2017.  At that time, I recommended that he undergo a 2D echo Doppler study and scheduled him for coronary CT angiography.  His echo Doppler study demonstrated hyperdynamic LV function with an EF of 65 to 70%.  Doppler parameters suggest grade 2 diastolic  dysfunction and elevated ventricular end-diastolic filling pressure.  He had mild MR, mild TR, and mild dilation of his left atrium.  Pulmonary pressures were normal.  CT coronary angiography was performed on November 09, 2017.  This was abnormal and demonstrated an elevated calcium score a 25 which is 78th percentile for age and sex.  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  There was greater than 75% calcific plaque in his proximal and mid diagonal 1 vessel.  The circumflex had 50% proximal plaque.  There was 50 to 75% mixed plaque in the OM 2 vessel less than 50% in the OM1 vessel.  His RCA had 50% calcified plaque proximally, 50 to 75% calcified plaque in the mid vessel.  There was mild aortic root dilatation at 4.1 cm.  His CT images were referred for [facility] analysis which were positive in the mid RCA 0.78, the mid LAD 0.78 and in the AV groove circumflex at 0.71.   I saw him in follow-up of the above studies and recommended cardiac catheterization.  Catheterization was done in November 15, 2017 which showed severe multivessel CAD with 85% ostial LAD stenosis, diffuse 75% mid LAD stenosis, total occlusion of the mid AV groove circumflex after the takeoff of the second obtuse marginal vessel with 50% diffuse stenosis in the first large  marginal branch, 80 and 90% stenoses in the second obtuse marginal vessel and extensive collateralization to the distal circumflex and third marginal via the LAD.  He had 1650% mid RCA stenoses and a dominant RCA.  Of note, he had probable high right radial artery takeoff with spasm/coarse stenosis above the elbow and there was retrograde filling of the brachial artery and the catheterization was transition to the femoral approach.  He underwent successful CABG revascularization surgery the following day by Dr. Roxy Horseman on November 16, 2017 with a LIMA graft to his  LAD, SVG to first marginal and distal circumflex, and SVG to the PDA of his RCA with left thigh and calf endoscopic vein harvest.  He developed atrial fibrillation with RVR on postop day 2 and was started on IV amiodarone.  He ultimately converted back to sinus rhythm but developed recurrent AF on postop day 4 on metoprolol and amiodarone and ultimately converted back to sinus rhythm.  He was started on Eliquis on day 5 for anticoagulation.  He was discharged on November 22, 2017.  Approximately 10 days later he started to develop left lower extremity swelling and redness with pain in the back of his calf.  He was evaluated by Ellwood Handler, PA-C at [facility] on December 04, 2017.  There was no sign of infection.  Venous duplex imaging did not reveal any DVT.  He saw Dr. Servando Snare for initial follow-up evaluation on December 14, 2017 at which time he was maintaining sinus rhythm.  He will be participating in cardiac rehab and his orientation is scheduled for February 15, 2018.  He denied any recurrent chest pain.  His breathing has improved with walking.  He went to the beach and was walking at least a mile per day.  He is sleeping better and snoring is less.    Since his November 2019 and prior to his February 2020 evaluation he was doing well and was  participating in cardiac rehabilitation and also had joined a gym. He was exercising regularly.  He denied chest pain, PND, or orthopnea.  Subsequent laboratory showed further TSH elevation.  His levothyroxine was increased to 75 mcg.  Amiodarone was discontinued due to LFT elevation and his dose of rosuvastatin was reduced to one half of the current dose.  I  saw him in February 2020 at that time he had not had follow-up laboratory.    He had had follow-up laboratory which continued to show hypothyroidism and his dose of levothyroxine has gradually been increased.  Recent laboratory done by Dr. Rex Kras several months ago continues to show a TSH was elevated and his  levothyroxine dose was increased from 100 to 125 mcg.  During the COVID-19 pandemic, he admits to weight gain.  He has not been exercising as much as he had in the past.  Although he felt well, when recently seen by Dr. Durward Fortes he was felt to potentially be more short of breath.  He was advised to see me back for follow-up evaluation.  I scheduled him to undergo repeat laboratory which was done on October 05, 2018.  Hemoglobin 15.4, hematocrit 44.2.  Lipid studies were significantly improved from 6 months previously with total cholesterol now 143, triglycerides 142, HDL 48, LDL 67.  Brain natruretic peptide was 131.  TSH had significantly improved but was still elevated at 15.9.  He had normal renal function with a BUN of 15 creatinine 0.96.  LFTs were significantly improved but minimally increased with an AST of 46 and  ALT at 49.  He denied any chest pain or awareness of palpitations.  I saw him for reevaluation on October 08, 2018.  During that evaluation, I reviewed his laboratory and felt he was euvolemic.  His ECG showed normal sinus rhythm at 60 bpm with an isolated PVC.  His QTc interval was 434 ms.  With his continued TSH elevation, levothyroxine was further titrated to 150 mcg.  Subsequently, Macon has continued to feel well.  He denied any awareness of palpitations.  Last week, in October 29, 2018 when checking his Apple watch he saw report that his heart rate was increased in the upper 130s.  He was unaware of this irregularity.  Upon further questioning, he had to go out of town over the last several days and believes he may have missed taking his morning metoprolol dose on Friday and Saturday and he could not remember if he had taken it yesterday morning.  He was supposed to be taken metoprolol tartrate 25 mg twice a day, HCTZ on a as needed basis which he has not needed.  He continues to be on Eliquis 5 mg twice a day and is tolerated the levothyroxine 150 mcg dose.  He is now on the vascepa which  was just renewed last week 2 capsules twice a day, rosuvastatin 20 mg daily for mixed hyperlipidemia.  After receiving his phone call, I advised that he come to the office to check an EKG.  With his ECG showing atrial flutter at a rate of 130 he was added onto my schedule for further evaluation.  During that evaluation, he was asymptomatic and felt well.  His ECG showed atrial flutter with variable block at 130 bpm with nonspecific ST-T changes.  He was on Eliquis for anticoagulation.  I recommended titration of metoprolol tartrate to 50 mg twice a day.  Laboratory drawn that day revealed a TSH 5.95, magnesium 2.3, potassium 4.7, BUN 14 creatinine 0.9.   I saw him on September 28 for follow-up evaluation.  He was feeling well and continue to be unaware of his heart rate irregularity.  However according to his Apple Watch, his heart rate was running in the 115-120 range   He denied chest pain.  He denied tremors.  He was sleeping well and denied any awareness of sleep apnea.  During that evaluation we discussed gradually him for DC cardioversion but this would require COVID-19 testing with quarantine for 3 days prior to having the procedure done.  His daughter was coming into town that weekend.  As result we further titrated his metoprolol to 75 mg twice a day which he did for 4 days and then increase to 200 mg twice a day.  He continues to be on Eliquis for anticoagulation.  He admits to increased fatigue but denies chest pain.   I saw Phyllis in follow-up on November 15, 2018.  At that time he had titrated the metoprolol up to 100 mg twice a day.  He underwent successful DC cardioversion on November 20, 2018 with restoration of sinus rhythm.  Of note, he developed hypotension prior to awakening from propofol and received normal saline and Neo-Synephrine.  Blood pressure normalized loud snoring was noted, and it was advised to consider sleep study evaluation.  Over the month after his cardioversion Bengie initially  did well but he inadvertently reduced his metoprolol from I suggested dose reduction to 75 mg twice a day and apparently has only been taking 25 mg twice a day.  In  addition, he has had difficulty with sinus congestion and was taking medication with pseudoephedrine.  He called the office on 12/20/18  stating that his Apple Watch had noted that his heart rate had been increased for almost a week and when I spoke with him his resting pulse was 135.  At that time I recommended he increase metoprolol to 50 mg twice a day and see me on the following day on December 21, 2018.  During that evaluation, he remained in atrial flutter with variable block at a rate of 115 bpm.  Recommended titration of metoprolol tartrate to 100 mg twice a day.  I discussed EP evaluation with consideration for atrial flutter ablation.  I again will had a lengthy discussion regarding the need for a sleep study due to high likelihood of obstructive sleep apnea.  He initially preferred trying the metoprolol increased dose to see if this would benefit him prior to an immediate EP evaluation.   I saw him in follow-up on the increase metoprolol 100 mg grams twice a day dosing at that time he was in 2-1 flutter with ventricular rate at 130.  I strongly recommended EP evaluation with atrial flutter ablation and discussed the case with Dr. Lovena Le who agreed to see the patient in the office on December 3 and plan to do atrial flutter ablation on December 4.  Since his sleep study was tentatively scheduled for December 2 I recommended that this be deferred until after his ablation.   Nain underwent successful ablation of his atrial flutter on January 11, 2019 which was isthmus-dependent right atrial flutter upon presentation to the EP lab.  He was able to be successfully treated and was discharged later that evening in sinus rhythm.  I last saw him on January 16, 2019 after he called the office the day previously complaining of some shortness of  breath.  He again called the office this morning stating that he was more short of breath, very fatigued and having no energy.  As result I added him onto my schedule  for further evaluation.  He denied any chest pain and was unaware of any palpitations; per his apple watch monitoring his resting pulse yesterday was 56 and today 57.    During that evaluation, I recommended that he undergo a chest x-ray, follow-up 2D echo Doppler study, and scheduled him for [place] study to make certain his dyspnea was not ischemia mediated.  His echo Doppler study showed EF at 60 to 65% with mild LVH.  There was abnormal septal motion consistent with his postoperative state.  There was grade 2 diastolic dysfunction.  He had severe biatrial enlargement.  A chest x-ray did not reveal any active disease.  A Lexiscan Myoview study was low risk without ischemia with nuclear stress EF at 60%.  He underwent his sleep study on January 25, 2019.  Apparently several days prior to his sleep study he apparently again ran out of his metoprolol.  Of note during the sleep study his ECG revealed that he was in atrial fibrillation.  He was found to have severe obstructive sleep apnea in a split-night protocol with an AHI of 63.5/h.  CPAP was initiated but due to continued events was transitioned to BiPAP therapy and he was ultimately titrated to 18/14.  AHI at this level was 3.6 with oxygen nadir 93%.    I saw him for follow-up on February 14, 2019.  At that time he had resumed metoprolol.  His set up date  for BiPAP was February 04, 2019.  Over the past week and a half on therapy he has noticed significant improvement in his previous fatigability.  A download was obtained since his set up date and he was 100% compliant since December 28.  He had a mask leak.  At 18/14 BiPAP pressure AHI was improved but still increased at 6.3.  There were no central events.    Since I last saw him, Thaddeaus was reevaluated by Dr. Lovena Le on February 19, 2019.  He was doing well and maintaining sinus rhythm.  Apparently was recommended that he reduce his metoprolol to 50 twice a day but apparently this has not been done.  Kalyan presents today for his initial sleep clinic evaluation.  Of note, a download from January 8 through March 05, 2019 shows that he is meeting compliance standards with 84% of usage days and averaging 6 hours and 20 minutes.  AHI was 7.9 and his BiPAP device was set at a minimum EPAP of 12 with maximum IPAP of 22.  Apparently over the past month, Jasiri states that he has lost 10 pounds by essentially giving up bread and reducing carbohydrates.  He believes with his weight loss he was not needing CPAP.  He had issues with mask leak and apparently went to get a new mask which was a ResMed air fit F 30i from [facility].  Apparently, he never adjusted the straps to fit his head and as result this resulted in significant leakage and consequently for the last 30 days he has only used CPAP for 5 days and only for 2 hours and 6 minutes on average.  AHI was 9.6 /h.  He presents for his  sleep clinic evaluation.  Past Medical History:  Diagnosis Date  . Anxiety    takes Xanax daily as needed  . Arthritis   . Cataract    removed both eyes  . Depression    takes Celexa daily  . Diverticulitis   . Diverticulosis   . Enlarged prostate    sightly  . Esophagitis   . Gastric ulcer   . GERD (gastroesophageal reflux disease)    takes Protonix daily  . Hx of adenomatous colonic polyps    benign  . Hyperlipidemia    takes Zocor daily  . Hypertension   . Insomnia    takes Ambien nightly  . Joint pain   . Joint swelling     Past Surgical History:  Procedure Laterality Date  . A-FLUTTER ABLATION N/A 01/11/2019   Procedure: A-FLUTTER ABLATION;  Surgeon: Evans Lance, MD;  Location: [facility];  Service: Cardiovascular;  Laterality: N/A;  . CARDIAC CATHETERIZATION    . CARDIOVERSION N/A 11/20/2018   Procedure:  CARDIOVERSION;  Surgeon: Troy Sine, MD;  Location: [facility];  Service: Cardiovascular;  Laterality: N/A;  . cartliage removed from nose  15yr ago  . cataract surgery Bilateral   . CERVICAL SPINE SURGERY    . COLONOSCOPY    . CORONARY ARTERY BYPASS GRAFT N/A 11/16/2017   Procedure: CORONARY ARTERY BYPASS GRAFTING (CABG) x4. LEFT ENDOSCOPIC SAPHENOUS VEIN HARVEST AND MAMMARY ARTERY TAKE DOWN. LIMA TO LAD, SVG TO PDA, SVG TO DISTAL CIRC & OMI.;  Surgeon: GGrace Isaac MD;  Location: [facility]  Service: Open Heart Surgery;  Laterality: N/A;  . FOREIGN BODY REMOVAL ESOPHAGEAL    . KNEE SURGERY Left    couple of times  . LEFT HEART CATH AND CORONARY ANGIOGRAPHY  N/A 11/15/2017   Procedure: LEFT HEART CATH AND CORONARY ANGIOGRAPHY;  Surgeon: Troy Sine, MD;  Location: [facility];  Service: Cardiovascular;  Laterality: N/A;  . NASAL SINUS SURGERY    . POLYPECTOMY    . TEE WITHOUT CARDIOVERSION N/A 11/16/2017   Procedure: TRANSESOPHAGEAL ECHOCARDIOGRAM (TEE);  Surgeon: [name], MD;  Location: [facility];  Service: Open Heart Surgery;  Laterality: N/A;  . TONSILLECTOMY    . TOTAL KNEE ARTHROPLASTY Left 06/17/2014   Procedure: TOTAL KNEE ARTHROPLASTY;  Surgeon: Garald Balding, MD;  Location: [facility];  Service: Orthopedics;  Laterality: Left;    Current Medications: Outpatient Medications Prior to Visit  Medication Sig Dispense Refill  . acetaminophen (TYLENOL) 500 MG tablet Take 1,000 mg by mouth every 6 (six) hours as needed for moderate pain or headache.    Marland Kitchen acyclovir (ZOVIRAX) 400 MG tablet Take 400 mg by mouth 2 (two) times daily.    Marland Kitchen aspirin EC 81 MG tablet Take 1 tablet (81 mg total) by mouth daily. 90 tablet 3  . citalopram (CELEXA) 20 MG tablet Take 20 mg by mouth daily.    Marland Kitchen ELIQUIS 5 MG TABS tablet TAKE 1 TABLET(5 MG) BY MOUTH TWICE DAILY 180 tablet 1  . ferrous sulfate 325 (65 FE) MG tablet Take 325 mg by mouth daily.    . Glucosamine HCl (GLUCOSAMINE  PO) Take 1 tablet by mouth 2 (two) times daily.    Marland Kitchen levothyroxine (SYNTHROID) 150 MCG tablet Take 1 tablet (150 mcg total) by mouth daily. 90 tablet 1  . Multiple Vitamins-Minerals (PRESERVISION AREDS 2 PO) Take 1 tablet by mouth 2 (two) times daily.    . pantoprazole (PROTONIX) 20 MG tablet TAKE 2 TABLETS(40 MG) BY MOUTH DAILY 180 tablet 3  . rosuvastatin (CRESTOR) 20 MG tablet Take 1 tablet (20 mg total) by mouth daily. 90 tablet 2  . sildenafil (REVATIO) 20 MG tablet Take 60 mg by mouth daily as needed for erectile dysfunction.    Marland Kitchen VASCEPA 1 g capsule TAKE 2 CAPSULES(2 GRAMS) BY MOUTH TWICE DAILY 360 capsule 1  . zolpidem (AMBIEN) 10 MG tablet Take 10 mg by mouth at bedtime.     . metoprolol tartrate (LOPRESSOR) 50 MG tablet Take 1.5 tablets (75 mg total) by mouth 2 (two) times daily. 180 tablet 3  . hydrochlorothiazide (MICROZIDE) 12.5 MG capsule Take 1 capsule (12.5 mg total) by mouth as needed (swelling). 30 capsule 3   No facility-administered medications prior to visit.     Allergies:   Patient has no known allergies.   Social History   Socioeconomic History  . Marital status: Single    Spouse name: Not on file  . Number of children: 3  . Years of education: Not on file  . Highest education level: Not on file  Occupational History  . Occupation: Owner  Tobacco Use  . Smoking status: Never Smoker  . Smokeless tobacco: Never Used  Substance and Sexual Activity  . Alcohol use: Yes    Alcohol/week: 10.0 standard drinks    Types: 10 Standard drinks or equivalent per week    Comment: 2 drinks 5 nights per week  . Drug use: No  . Sexual activity: Never  Other Topics Concern  . Not on file  Social History Narrative  . Not on file   Social Determinants of Health   Financial Resource Strain:   . Difficulty of Paying Living Expenses:   Food Insecurity:   .  Worried About Charity fundraiser in the Last Year:   . Arboriculturist in the Last Year:   Transportation Needs:     . Film/video editor (Medical):   Marland Kitchen Lack of Transportation (Non-Medical):   Physical Activity:   . Days of Exercise per Week:   . Minutes of Exercise per Session:   Stress:   . Feeling of Stress :   Social Connections:   . Frequency of Communication with Friends and Family:   . Frequency of Social Gatherings with Friends and Family:   . Attends Religious Services:   . Active Member of Clubs or Organizations:   . Attends Archivist Meetings:   Marland Kitchen Marital Status:     Additional social history is notable in that he is divorced x2.  He has 3 children and his oldest son had lived in [place], and currently is back in [place] searching for new employment.  His second son currently had lived [place] and was married in [place] and [place] he moved back to the [place] area.  His daughter works in [place] as a Government social research officer in a Web designer.  There is no tobacco use.  He drinks alcohol.  Family History:  The patient's family history includes Colon cancer in his paternal grandfather.  Mother died at age 22 with emphysema.  His father died at 81.  He has 2 sisters ages 32 and 75.  ROS General: Negative; No fevers, chills, or night sweats; positive for fatigue HEENT: Decreased hearing with hearing aid in right ear, no sinus congestion, difficulty swallowing Pulmonary: Negative; No cough, wheezing, shortness of breath, hemoptysis Cardiovascular: See HPI GI: History of diverticular disease and colonic polyps. GU: Remote history of genital herpes; erectile dysfunction Musculoskeletal: Negative; no myalgias, joint pain, or weakness Hematologic/Oncology: Negative; no easy bruising, bleeding Endocrine: Positive for hypothyroidism Neuro: Negative; no changes in balance, headaches Skin: Negative; No rashes or skin lesions Psychiatric:  Sleep: Positive snoring snoring, previous fatigue; documented to have severe OSA now on BiPAP therapy; no bruxism, restless legs,  hypnogognic hallucinations, no cataplexy Other comprehensive 14 point system review is negative.   PHYSICAL EXAM:   VS:  BP 128/62   Pulse (!) 49   Ht 5' 9"  (1.753 m)   Wt 195 lb (88.5 kg)   SpO2 95%   BMI 28.80 kg/m     Repeat blood pressure 122/64 , Wt Readings from Last 3 Encounters:  04/25/19 195 lb (88.5 kg)  02/19/19 201 lb 6.4 oz (91.4 kg)  02/14/19 200 lb (90.7 kg)    General: Alert, oriented, no distress.  Skin: normal turgor, no rashes, warm and dry; bearded HEENT: Normocephalic, atraumatic. Pupils equal round and reactive to light; sclera anicteric; extraocular muscles intact;  Nose without nasal septal hypertrophy Mouth/Parynx benign; Mallinpatti scale 3 Neck: No JVD, no carotid bruits; normal carotid upstroke Lungs: clear to ausculatation and percussion; no wheezing or rales Chest wall: without tenderness to palpitation Heart: PMI not displaced, RRR but bradycardic, s1 s2 normal, 1/6 systolic murmur, no diastolic murmur, no rubs, gallops, thrills, or heaves Abdomen: soft, nontender; no hepatosplenomehaly, BS+; abdominal aorta nontender and not dilated by palpation. Back: no CVA tenderness Pulses 2+ Musculoskeletal: full range of motion, normal strength, no joint deformities Extremities: no clubbing cyanosis or edema, Homan's sign negative  Neurologic: grossly nonfocal; Cranial nerves grossly wnl Psychologic: Normal mood and affect  Studies/Labs Reviewed:   ECG (independently read by me):Sinus bradycardia at 48;  QTc 441 msec; No ST changes; no ectopy  January 7, 2021ECG (independently read by me): Sinus Bradycardia at 51; no STT changes; QTc 442 msec; no ectopy  January 16, 2019 ECG (independently read by me): Sinus bradycardia at 57 bpm with possible left atrial enlargement.  PR interval 176 ms, QTc interval 476 ms.  No ST segment changes.  January 02, 2019 ECG (independently read by me): Atrial flutter with 2-1 AV conduction with a ventricular rate of 130  bpm.  December 21, 2018 ECG (independently read by me): Atrial flutter with variable block at 115, QT 402,QTC 556 msec  Cardioversion November 20, 2018 with restoration of sinus rhythm.  November 15, 2018 ECG (independently read by me): Atrial Flutter with variable block at 97 bpm  September 2020 ECG (independently read by me): Atrial flutter with variable block with an average heart rate at 115 bpm  October 29, 2018 ECG (independently read by me): Atrial flutter with variable block 130 bpm.  No specific ST-T changes  October 08, 2018 ECG (independently read by me): NSR at 60; isolated PVC; QTc 434 msec.  April 02, 2018 ECG (independently read by me): NSR 62; Nonspecific T change; QTc 452 msec  December 29, 2017 ECG (independently read by me): Sinus bradycardia at 49 bpm.  Nonspecific ST changes.  QTc interval 464 ms.  No ectopy.  ECG (independently read by me): Sinus rhythm at 64 bpm.  Right bundle branch block with repolarization changes.  No ectopy.  October 24, 2017 ECG (independently read by me): Normal sinus rhythm at 62 bpm.  Right bundle branch block with repolarization changes.   ECG  April 18, 2014 revealed normal sinus rhythm.  Right bundle branch block was not present.  Recent Labs: BMP Latest Ref Rng & Units 01/16/2019 01/10/2019 11/16/2018  Glucose 65 - 99 mg/dL 79 116(H) 103(H)  BUN 8 - 27 mg/dL 18 14 14   Creatinine 0.76 - 1.27 mg/dL 0.75(L) 0.90 0.87  BUN/Creat Ratio 10 - 24 24 16 16   Sodium 134 - 144 mmol/L 139 138 138  Potassium 3.5 - 5.2 mmol/L 4.4 4.8 4.8  Chloride 96 - 106 mmol/L 102 101 100  CO2 20 - 29 mmol/L 21 21 22   Calcium 8.6 - 10.2 mg/dL 8.7 9.4 9.2     Hepatic Function Latest Ref Rng & Units 11/16/2018 10/05/2018 04/03/2018  Total Protein 6.0 - 8.5 g/dL 6.6 6.7 6.8  Albumin 3.7 - 4.7 g/dL 4.1 4.6 4.3  AST 0 - 40 IU/L 37 46(H) 32  ALT 0 - 44 IU/L 41 49(H) 32  Alk Phosphatase 39 - 117 IU/L 124(H) 91 183(H)  Total Bilirubin 0.0 - 1.2 mg/dL 0.4 0.4  0.4  Bilirubin, Direct 0.00 - 0.40 mg/dL - - -    CBC Latest Ref Rng & Units 01/16/2019 01/10/2019 11/16/2018  WBC 3.4 - 10.8 x10E3/uL 6.4 7.7 7.8  Hemoglobin 13.0 - 17.7 g/dL 14.3 15.4 14.0  Hematocrit 37.5 - 51.0 % 41.9 45.2 40.2  Platelets 150 - 450 x10E3/uL 159 204 211   Lab Results  Component Value Date   MCV 95 01/16/2019   MCV 95 01/10/2019   MCV 96 11/16/2018   Lab Results  Component Value Date   TSH 4.050 01/16/2019   Lab Results  Component Value Date   HGBA1C 5.4 11/15/2017     BNP    Component Value Date/Time   BNP 131.4 (H) 10/05/2018 1010    ProBNP No results found for: PROBNP   Lipid Panel  Component Value Date/Time   CHOL 143 10/05/2018 1010   TRIG 142 10/05/2018 1010   HDL 48 10/05/2018 1010   CHOLHDL 3.0 10/05/2018 1010   CHOLHDL 3.4 01/29/2007 0410   VLDL 17 01/29/2007 0410   LDLCALC 67 10/05/2018 1010     RADIOLOGY: No results found.   Additional studies/ records that were reviewed today include:  I reviewed his prior office visits and most recent DC cardioversion.  CONCLUSIONS:  1. Isthmus-dependent right atrial flutter upon presentation.  2. Successful radiofrequency ablation of atrial flutter along the cavotricuspid isthmus with complete bidirectional isthmus block achieved.  3. No inducible arrhythmias following ablation.  4. No early apparent complications.   Cristopher Peru, MD  12:37 PM 01/11/2019   SPLIT NIGHT SLEEP STUDY: 01/25/2019 SLEEP STUDY TECHNIQUE As per the AASM Manual for the Scoring of Sleep and Associated Events v2.3 (April 2016) with a hypopnea requiring 4% desaturations.  The channels recorded and monitored were frontal, central and occipital EEG, electrooculogram (EOG), submentalis EMG (chin), nasal and oral airflow, thoracic and abdominal wall motion, anterior tibialis EMG, snore microphone, electrocardiogram, and pulse oximetry. Bi-level positive airway pressure (BiPAP) was initiated when the patient met  split night criteria and was titrated according to treat sleep-disordered breathing.  RESPIRATORY PARAMETERS Diagnostic Total AHI (/hr):            63.5     RDI (/hr):         65.8     OA Index (/hr):            17.7     CA Index (/hr):      5.9 REM AHI (/hr):            N/A      NREM AHI (/hr):          63.5     Supine AHI (/hr):         63.5     Non-supine AHI (/hr):        N/A Min O2 Sat (%):          81.0     Mean O2 (%):  93.0     Time below 88% (min):           18.7       Titration Optimal IPAP Pressure (cm): 18        Optimal EPAP Pressure (cm):            14        AHI at Optimal Pressure (/hr):          3.6       Min O2 at Optimal Pressure (%):       93.0 Sleep % at Optimal (%):         97        Supine % at Optimal (%):       7            SLEEP ARCHITECTURE The study was initiated at 10:10:34 PM and terminated at 4:34:09 AM. The total recorded time was 383.6 minutes. EEG confirmed total sleep time was 333.3 minutes yielding a sleep efficiency of 86.9%%. Sleep onset after lights out was 0.8 minutes with a REM latency of 195.0 minutes. The patient spent 1.5%% of the night in stage N1 sleep, 80.9%% in stage N2 sleep, 0.0%% in stage N3 and 17.6% in REM. Wake after sleep onset (WASO) was 49.5 minutes. The Arousal Index was 32.0/hour.  LEG MOVEMENT DATA The total  Periodic Limb Movements of Sleep (PLMS) were 0. The PLMS index was 0.0 .  CARDIAC DATA The 2 lead EKG demonstrated sinus rhythm. The mean heart rate was 100.0 beats per minute. Other EKG findings include: None.  IMPRESSIONS - Severe obstructive sleep apnea occurred during the diagnostic portion of the study (AHI 63.5 /h; RDI 65.8/h). CPAP was initiated at 5 cm and was titrated to 9 cm . Due to frequent central events, BiPAP was implemented at 11/7 and was titrated to 18/14 cm of water: AHI 3.6/h, O2 nadir 93%. 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 - Moderate oxygen  desaturation was noted during the diagnostic portion of the study (Min O2 = 81.0%). - The patient snored with loud snoring volume during the diagnostic portion of the study. - It appears that the patient was in atrial fibrillation throughtout the study. - Clinically significant periodic limb movements of sleep did not occur during the study.  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 A Small size [employer]kel Full Face Mask Simplus mask was used for the titration. If patient continues to experience central events, ASV titration may be necessary. - Effort should be made to optimize nasal and oropharyngeal patency. - Avoid alcohol, sedatives and other CNS depressants that may worsen sleep apnea and disrupt normal sleep architecture. - Sleep hygiene should be reviewed to assess factors that may improve sleep quality. - Weight management and regular exercise should be initiated or continued. - Recoomend f/u cardiology evalution for probable recurrent atrial fibrillation.  - Recommend a download in 30 days and sleep clinic evaluation after 30 days of therapy.  [Electronically signed] 01/27/2019 04:13 PM  Shelva Majestic MD, [facility] [place], American Board of Sleep Medicine   ASSESSMENT:    1. OSA (obstructive sleep apnea)   2. CAD in native artery   3. S/P CABG x 4   4. PAF (paroxysmal atrial fibrillation) ([place])   5. S/P ablation of atrial flutter   6. Hyperlipidemia LDL goal <70   7. Hypothyroidism, unspecified type   8. Anticoagulated    PLAN:  Mr. 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  He has a history of mild hyperlipidemia and remotely had been on simvastatin 40 mg daily and has been followed by Dr. Hulan Fess.  In the fall 2019 he began to notice mild  shortness of breath particularly with walking up steps.  An echo Doppler study revealed hyperdynamic LV function with an EF of 65 to 70% but with grade 2 diastolic dysfunction and abnormal tissue Doppler.  A CT coronary angiogram was suggestive of significant multivessel CAD and was FFR positive.  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 Roxy Horseman.  His postoperative course was complicated by paroxysmal atrial fibrillation for which he was started on amiodarone and ultimately was discharged on Eliquis.  When I saw him for initial post hospital evaluation he was still on amiodarone 200 mg twice a day, metoprolol tartrate 25 mg twice a day.  His ECG showed sinus bradycardia 5 weeks status post CABG revascularization; amiodarone was decreased to 200 mg daily and subsequently discontinued when subsequent laboratory showed LFT  elevation and further increase in his TSH level.  With his hypothyroidism he required increasing doses of levothyroxine, ultimately titrated to his present dose of 150 mcg daily.  He had developed episodes of atrial flutter and underwent initial cardioversion in October 2020.  Due to recurrent atrial flutter he was ultimately referred to Dr. Lovena Le and underwent successful a flutter ablation on January 12, 2019.  Due to concerns for obstructive sleep apnea he ultimately was able to have his sleep study which confirmed my suspicion.  He was found to have severe sleep apnea with an AHI of over 60 times per hour with significant oxygen desaturation to a nadir of 80%.  When I saw him for sleep and follow-up of his ablation in early January,  he has been on BiPAP therapy for 9 days and consents marked improvement in his previous fatigability and energy.   Of note he had run out of his metoprolol 2 days prior to his sleep study and he was back in atrial fibrillation during his  sleep evaluation.  His ECG at his January visit on  metoprolol 75 mg twice a day revealed  sinus rhythm with asymptomatic sinus bradycardia 51 bpm.  For his initial month of therapy, [place] has met compliance standards for his BiPAP.  Unfortunately over the past month he essentially has not use therapy and only had 5 out of 30 days of usage for 2 hours and 6 minutes.  He apparently had received a new mask but never set his straps to fit his head creating a significant leak.  He tells me he does not think he needs BiPAP anymore.  I had a long discussion with him and again stressed the importance of continued therapy with his high risk for recurrent atrial fibrillation if his sleep apnea which is severe is left untreated.  I had him call his partner and advised that she bring his mask to the office while he was still here so that we can adjust his mask to his head size.  While Jocelyn Lamer was here with him in the room I discussed the absolute importance that he must continue therapy and a 10 pound weight loss would not correct his severe obstructive sleep apnea.  His head here was fitted appropriately and he had a trial with his CPAP unit to make certain this was fitted well without leak.  I also obtained an ECG in the office today with his significant bradycardia which showed sinus rhythm at 48.  Presently I have recommended he reduce his metoprolol to 75 mg in the morning and 50 mg at night and if he continues to feel well without recurrent arrhythmia and with continued bradycardia further dose reduction to 50 twice daily will be recommended.  I will see him in 3 months for reevaluation or sooner as needed.     Medication Adjustments/Labs and Tests Ordered: Current medicines are reviewed at length with the patient today.  Concerns regarding medicines are outlined above.  Medication changes, Labs and Tests ordered today are listed in the Patient Instructions below. Patient Instructions  Medication Instructions:    DECREASE YOUR METOPROLOL TARTRATE. TAKE 75MG (1 AND 1/2 TAB) IN THE MORNING AND 50MG (1 TAB) IN THE EVENING *If you need a refill on your cardiac medications before your next appointment, please call your pharmacy*    Follow-Up: At [facility], you and your health needs are our priority.  As part of our continuing mission to provide you with exceptional heart care, we  have created designated Provider Care Teams.  These Care Teams include your primary Cardiologist (physician) and Advanced Practice Providers (APPs -  Physician Assistants and Nurse Practitioners) who all work together to provide you with the care you need, when you need it.  We recommend signing up for the patient portal called "MyChart".  Sign up information is provided on this After Visit Summary.  MyChart is used to connect with patients for Virtual Visits (Telemedicine).  Patients are able to view lab/test results, encounter notes, upcoming appointments, etc.  Non-urgent messages can be sent to your provider as well.   To learn more about what you can do with MyChart, go to [email or web address].    Your next appointment:   3 month(s)  The format for your next appointment:   In Person  Provider:   Shelva Majestic, MD       Signed, Shelva Majestic, MD  04/27/2019 3:39 PM    [place] [address], [place], [place], [place]  [address] Phone: [phone]

## 2019-04-25 NOTE — Patient Instructions (Signed)
Medication Instructions:  DECREASE YOUR METOPROLOL TARTRATE. TAKE 75MG  (1 AND 1/2 TAB) IN THE MORNING AND 50MG  (1 TAB) IN THE EVENING *If you need a refill on your cardiac medications before your next appointment, please call your pharmacy*    Follow-Up: At [facility], you and your health needs are our priority.  As part of our continuing mission to provide you with exceptional heart care, we have created designated Provider Care Teams.  These Care Teams include your primary Cardiologist (physician) and Advanced Practice Providers (APPs -  Physician Assistants and Nurse Practitioners) who all work together to provide you with the care you need, when you need it.  We recommend signing up for the patient portal called "MyChart".  Sign up information is provided on this After Visit Summary.  MyChart is used to connect with patients for Virtual Visits (Telemedicine).  Patients are able to view lab/test results, encounter notes, upcoming appointments, etc.  Non-urgent messages can be sent to your provider as well.   To learn more about what you can do with MyChart, go to [email or web address].    Your next appointment:   3 month(s)  The format for your next appointment:   In Person  Provider:   Shelva Majestic, MD

## 2019-04-25 NOTE — Progress Notes (Deleted)
Cardiology Office Note    Date:  04/25/2019   ID:  Carl [name], DOB [birth year], MRN [id number]  PCP:  Hulan Fess, MD  Cardiologist:  Shelva Majestic, MD    History of Present Illness:  Carl [name] is a 74 y.o. male who presents to the office today for a follow-up evaluation of his atrial for ablation and sleep study with BiPAP initiation.  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  In 2009 he developed transient numbness of his right face which lasted for several minutes and at that time apparently underwent evaluation and had a normal MRI, MRA, and carotid duplex studies.  He was found to have only mild to moderate plaque in the proximal right internal carotid artery without stenosis.  I had seen him in March 2016 for preoperative cardiology clearance prior to undergoing left knee replacement surgery by Dr. Durward Fortes.  Preoperative ECG suggested possible junctional rhythm which was new from an ECG of 2012 which previously had only shown sinus bradycardia.  He was asymptomatic.  When I saw him, his ECG showed sinus rhythm at 61 bpm with normal intervals and on that ECG P waves were normal and upright inferiorly.  Carl [name] has remained fairly active. In 2019  he had not been exercising as much as he had in the past still working out with a trainer and denied any associated chest pain or palpitations.  He in September 201 19 he began to notice more shortness of breath with walking up steps.  He was  at a friend's house and was told that he appeared to be more short of breath than previously.  I saw him for evaluation of his exertional shortness of breath on October 24, 2017.  At that time, I recommended that he undergo a 2D echo Doppler study and scheduled him for coronary CT angiography.  His echo Doppler study demonstrated hyperdynamic LV function with an EF of 65 to 70%.  Doppler parameters suggest grade 2 diastolic dysfunction  and elevated ventricular end-diastolic filling pressure.  He had mild MR, mild TR, and mild dilation of his left atrium.  Pulmonary pressures were normal.  CT coronary angiography was performed on November 09, 2017.  This was abnormal and demonstrated an elevated calcium score a 25 which is 78th percentile for age and sex.  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  There was greater than 75% calcific plaque in his proximal and mid diagonal 1 vessel.  The circumflex had 50% proximal plaque.  There was 50 to 75% mixed plaque in the OM 2 vessel less than 50% in the OM1 vessel.  His RCA had 50% calcified plaque proximally, 50 to 75% calcified plaque in the mid vessel.  There was mild aortic root dilatation at 4.1 cm.  His CT images were referred for [facility] analysis which were positive in the mid RCA 0.78, the mid LAD 0.78 and in the AV groove circumflex at 0.71.   I saw him in follow-up of the above studies and recommended cardiac catheterization.  Catheterization was done in November 15, 2017 which showed severe multivessel CAD with 85% ostial LAD stenosis, diffuse 75% mid LAD stenosis, total occlusion of the mid AV groove circumflex after the takeoff of the second obtuse marginal vessel with 50% diffuse stenosis in the first large  marginal branch, 80 and 90% stenoses in the second obtuse marginal vessel and extensive collateralization to the distal circumflex and third marginal via the LAD.  He had 1650% mid RCA stenoses and a dominant RCA.  Of note, he had probable high right radial artery takeoff with spasm/coarse stenosis above the elbow and there was retrograde filling of the brachial artery and the catheterization was transition to the femoral approach.  He underwent successful CABG revascularization surgery the following day by Dr. Roxy Horseman on November 16, 2017 with a LIMA graft to his LAD, SVG to  first marginal and distal circumflex, and SVG to the PDA of his RCA with left thigh and calf endoscopic vein harvest.  He developed atrial fibrillation with RVR on postop day 2 and was started on IV amiodarone.  He ultimately converted back to sinus rhythm but developed recurrent AF on postop day 4 on metoprolol and amiodarone and ultimately converted back to sinus rhythm.  He was started on Eliquis on day 5 for anticoagulation.  He was discharged on November 22, 2017.  Approximately 10 days later he started to develop left lower extremity swelling and redness with pain in the back of his calf.  He was evaluated by Ellwood Handler, PA-C at [facility] on December 04, 2017.  There was no sign of infection.  Venous duplex imaging did not reveal any DVT.  He saw Dr. Servando Snare for initial follow-up evaluation on December 14, 2017 at which time he was maintaining sinus rhythm.  He will be participating in cardiac rehab and his orientation is scheduled for February 15, 2018.  He denied any recurrent chest pain.  His breathing has improved with walking.  He went to the beach and was walking at least a mile per day.  He is sleeping better and snoring is less.    Since his November 2019 and prior to his February 2020 evaluation he was doing well and was  participating in cardiac rehabilitation and also had joined a gym. He was exercising regularly.  He denied chest pain, PND, or orthopnea.  Subsequent laboratory showed further TSH elevation.  His levothyroxine was increased to 75 mcg.  Amiodarone was discontinued due to LFT elevation and his dose of rosuvastatin was reduced to one half of the current dose.  I  saw him in February 2020 at that time he had not had follow-up laboratory.    He had had follow-up laboratory which continued to show hypothyroidism and his dose of levothyroxine has gradually been increased.  Recent laboratory done by Dr. Rex Kras several months ago continues to show a TSH was elevated and his levothyroxine dose was  increased from 100 to 125 mcg.  During the COVID-19 pandemic, he admits to weight gain.  He has not been exercising as much as he had in the past.  Although he felt well, when recently seen by Dr. Durward Fortes he was felt to potentially be more short of breath.  He was advised to see me back for follow-up evaluation.  I scheduled him to undergo repeat laboratory which was done on October 05, 2018.  Hemoglobin 15.4, hematocrit 44.2.  Lipid studies were significantly improved from 6 months previously with total cholesterol now 143, triglycerides 142, HDL 48, LDL 67.  Brain natruretic peptide was 131.  TSH had significantly improved but was still elevated at 15.9.  He had normal renal function with a BUN of 15 creatinine 0.96.  LFTs were significantly improved but minimally increased with an AST of 46 and  ALT at 49.  He denied any chest pain or awareness of palpitations.  I saw him for reevaluation on October 08, 2018.  During that evaluation, I reviewed his laboratory and felt he was euvolemic.  His ECG showed normal sinus rhythm at 60 bpm with an isolated PVC.  His QTc interval was 434 ms.  With his continued TSH elevation, levothyroxine was further titrated to 150 mcg.  Subsequently, Adriene has continued to feel well.  He denied any awareness of palpitations.  Last week, in October 29, 2018 when checking his Apple watch he saw report that his heart rate was increased in the upper 130s.  He was unaware of this irregularity.  Upon further questioning, he had to go out of town over the last several days and believes he may have missed taking his morning metoprolol dose on Friday and Saturday and he could not remember if he had taken it yesterday morning.  He was supposed to be taken metoprolol tartrate 25 mg twice a day, HCTZ on a as needed basis which he has not needed.  He continues to be on Eliquis 5 mg twice a day and is tolerated the levothyroxine 150 mcg dose.  He is now on the vascepa which was just renewed last  week 2 capsules twice a day, rosuvastatin 20 mg daily for mixed hyperlipidemia.  After receiving his phone call, I advised that he come to the office to check an EKG.  With his ECG showing atrial flutter at a rate of 130 he was added onto my schedule for further evaluation.  During that evaluation, he was asymptomatic and felt well.  His ECG showed atrial flutter with variable block at 130 bpm with nonspecific ST-T changes.  He was on Eliquis for anticoagulation.  I recommended titration of metoprolol tartrate to 50 mg twice a day.  Laboratory drawn that day revealed a TSH 5.95, magnesium 2.3, potassium 4.7, BUN 14 creatinine 0.9.   I saw him on September 28 for follow-up evaluation.  He was feeling well and continue to be unaware of his heart rate irregularity.  However according to his Apple Watch, his heart rate was running in the 115-120 range   He denied chest pain.  He denied tremors.  He was sleeping well and denied any awareness of sleep apnea.  During that evaluation we discussed gradually him for DC cardioversion but this would require COVID-19 testing with quarantine for 3 days prior to having the procedure done.  His daughter was coming into town that weekend.  As result we further titrated his metoprolol to 75 mg twice a day which he did for 4 days and then increase to 200 mg twice a day.  He continues to be on Eliquis for anticoagulation.  He admits to increased fatigue but denies chest pain.   I saw Carl [name] in follow-up on November 15, 2018.  At that time he had titrated the metoprolol up to 100 mg twice a day.  He underwent successful DC cardioversion on November 20, 2018 with restoration of sinus rhythm.  Of note, he developed hypotension prior to awakening from propofol and received normal saline and Neo-Synephrine.  Blood pressure normalized loud snoring was noted, and it was advised to consider sleep study evaluation.  Over the month after his cardioversion Carl [name] initially did well but he  inadvertently reduced his metoprolol from I suggested dose reduction to 75 mg twice a day and apparently has only been taking 25 mg twice a day.  In  addition, he has had difficulty with sinus congestion and was taking medication with pseudoephedrine.  He called the office on 12/20/18  stating that his Apple Watch had noted that his heart rate had been increased for almost a week and when I spoke with him his resting pulse was 135.  At that time I recommended he increase metoprolol to 50 mg twice a day and see me on the following day on December 21, 2018.  During that evaluation, he remained in atrial flutter with variable block at a rate of 115 bpm.  Recommended titration of metoprolol tartrate to 100 mg twice a day.  I discussed EP evaluation with consideration for atrial flutter ablation.  I again will had a lengthy discussion regarding the need for a sleep study due to high likelihood of obstructive sleep apnea.  He initially preferred trying the metoprolol increased dose to see if this would benefit him prior to an immediate EP evaluation.   I saw him in follow-up on the increase metoprolol 100 mg grams twice a day dosing at that time he was in 2-1 flutter with ventricular rate at 130.  I strongly recommended EP evaluation with atrial flutter ablation and discussed the case with Dr. Lovena Le who agreed to see the patient in the office on December 3 and plan to do atrial flutter ablation on December 4.  Since his sleep study was tentatively scheduled for December 2 I recommended that this be deferred until after his ablation.   Carl [name] underwent successful ablation of his atrial flutter on January 11, 2019 which was isthmus-dependent right atrial flutter upon presentation to the EP lab.  He was able to be successfully treated and was discharged later that evening in sinus rhythm.  I last saw him on January 16, 2019 after he called the office the day previously complaining of some shortness of breath.  He again  called the office this morning stating that he was more short of breath, very fatigued and having no energy.  As result I added him onto my schedule  for further evaluation.  He denied any chest pain and was unaware of any palpitations; per his apple watch monitoring his resting pulse yesterday was 56 and today 57.    During that evaluation, I recommended that he undergo a chest x-ray, follow-up 2D echo Doppler study, and scheduled him for [place] study to make certain his dyspnea was not ischemia mediated.  His echo Doppler study showed EF at 60 to 65% with mild LVH.  There was abnormal septal motion consistent with his postoperative state.  There was grade 2 diastolic dysfunction.  He had severe biatrial enlargement.  A chest x-ray did not reveal any active disease.  A Lexiscan Myoview study was low risk without ischemia with nuclear stress EF at 60%.  He underwent his sleep study on January 25, 2019.  Apparently several days prior to his sleep study he apparently again ran out of his metoprolol.  Of note during the sleep study his ECG revealed that he was in atrial fibrillation.  He was found to have severe obstructive sleep apnea in a split-night protocol with an AHI of 63.5/h.  CPAP was initiated but due to continued events was transitioned to BiPAP therapy and he was ultimately titrated to 18/14.  AHI at this level was 3.6 with oxygen nadir 93%.  Carl [name] resumed metoprolol subsequently.  His set up date for BiPAP was February 04, 2019.  Over the past week and a half on  therapy he has noticed significant improvement in his previous fatigability.  A download was obtained since his set up date and he is 100% compliant since December 28.  He does have a mask leak.  At 18/14 BiPAP pressure AHI was improved but still increased at 6.3.  There were no central events.  He presents for evaluation  Past Medical History:  Diagnosis Date  . Anxiety    takes Xanax daily as needed  . Arthritis   . Cataract     removed both eyes  . Depression    takes Celexa daily  . Diverticulitis   . Diverticulosis   . Enlarged prostate    sightly  . Esophagitis   . Gastric ulcer   . GERD (gastroesophageal reflux disease)    takes Protonix daily  . Hx of adenomatous colonic polyps    benign  . Hyperlipidemia    takes Zocor daily  . Hypertension   . Insomnia    takes Ambien nightly  . Joint pain   . Joint swelling     Past Surgical History:  Procedure Laterality Date  . A-FLUTTER ABLATION N/A 01/11/2019   Procedure: A-FLUTTER ABLATION;  Surgeon: Evans Lance, MD;  Location: [place] [facility];  Service: Cardiovascular;  Laterality: N/A;  . CARDIAC CATHETERIZATION    . CARDIOVERSION N/A 11/20/2018   Procedure: CARDIOVERSION;  Surgeon: Troy Sine, MD;  Location: [facility] ENDOSCOPY;  Service: Cardiovascular;  Laterality: N/A;  . cartliage removed from nose  23yr ago  . cataract surgery Bilateral   . CERVICAL SPINE SURGERY    . COLONOSCOPY    . CORONARY ARTERY BYPASS GRAFT N/A 11/16/2017   Procedure: CORONARY ARTERY BYPASS GRAFTING (CABG) x4. LEFT ENDOSCOPIC SAPHENOUS VEIN HARVEST AND MAMMARY ARTERY TAKE DOWN. LIMA TO LAD, SVG TO PDA, SVG TO DISTAL CIRC & OMI.;  Surgeon: GGrace Isaac MD;  [place] [facility]  Service: Open Heart Surgery;  Laterality: N/A;  . FOREIGN BODY REMOVAL ESOPHAGEAL    . KNEE SURGERY Left    couple of times  . LEFT HEART CATH AND CORONARY ANGIOGRAPHY N/A 11/15/2017   Procedure: LEFT HEART CATH AND CORONARY ANGIOGRAPHY;  Surgeon: KTroy Sine MD;  [facility];  Service: Cardiovascular;  Laterality: N/A;  . NASAL SINUS SURGERY    . POLYPECTOMY    . TEE WITHOUT CARDIOVERSION N/A 11/16/2017   Procedure: TRANSESOPHAGEAL ECHOCARDIOGRAM (TEE);  Surgeon: GGrace Isaac MD;  [facility] [facility]  Service: Open Heart Surgery;  Laterality: N/A;  . TONSILLECTOMY    . TOTAL KNEE ARTHROPLASTY Left 06/17/2014   Procedure: TOTAL KNEE ARTHROPLASTY;  Surgeon:  PGarald Balding MD;  [facility] [facility]  Service: Orthopedics;  Laterality: Left;    Current Medications: Outpatient Medications Prior to Visit  Medication Sig Dispense Refill  . acetaminophen (TYLENOL) 500 MG tablet Take 1,000 mg by mouth every 6 (six) hours as needed for moderate pain or headache.    .Marland Kitchenacyclovir (ZOVIRAX) 400 MG tablet Take 400 mg by mouth 2 (two) times daily.    .Marland Kitchenaspirin EC 81 MG tablet Take 1 tablet (81 mg total) by mouth daily. 90 tablet 3  . citalopram (CELEXA) 20 MG tablet Take 20 mg by mouth daily.    .Marland KitchenELIQUIS 5 MG TABS tablet TAKE 1 TABLET(5 MG) BY MOUTH TWICE DAILY 180 tablet 1  . ferrous sulfate 325 (65 FE) MG tablet Take 325 mg by mouth daily.    . Glucosamine HCl (GLUCOSAMINE  PO) Take 1 tablet by mouth 2 (two) times daily.    Marland Kitchen levothyroxine (SYNTHROID) 150 MCG tablet Take 1 tablet (150 mcg total) by mouth daily. 90 tablet 1  . Multiple Vitamins-Minerals (PRESERVISION AREDS 2 PO) Take 1 tablet by mouth 2 (two) times daily.    . pantoprazole (PROTONIX) 20 MG tablet TAKE 2 TABLETS(40 MG) BY MOUTH DAILY 180 tablet 3  . rosuvastatin (CRESTOR) 20 MG tablet Take 1 tablet (20 mg total) by mouth daily. 90 tablet 2  . sildenafil (REVATIO) 20 MG tablet Take 60 mg by mouth daily as needed for erectile dysfunction.    Marland Kitchen VASCEPA 1 g capsule TAKE 2 CAPSULES(2 GRAMS) BY MOUTH TWICE DAILY 360 capsule 1  . zolpidem (AMBIEN) 10 MG tablet Take 10 mg by mouth at bedtime.     . metoprolol tartrate (LOPRESSOR) 50 MG tablet Take 1.5 tablets (75 mg total) by mouth 2 (two) times daily. 180 tablet 3  . hydrochlorothiazide (MICROZIDE) 12.5 MG capsule Take 1 capsule (12.5 mg total) by mouth as needed (swelling). 30 capsule 3   No facility-administered medications prior to visit.     Allergies:   Patient has no known allergies.   Social History   Socioeconomic History  . Marital status: Single    Spouse name: Not on file  . Number of children: 3  . Years of education: Not on  file  . Highest education level: Not on file  Occupational History  . Occupation: Owner  Tobacco Use  . Smoking status: Never Smoker  . Smokeless tobacco: Never Used  Substance and Sexual Activity  . Alcohol use: Yes    Alcohol/week: 10.0 standard drinks    Types: 10 Standard drinks or equivalent per week    Comment: 2 drinks 5 nights per week  . Drug use: No  . Sexual activity: Never  Other Topics Concern  . Not on file  Social History Narrative  . Not on file   Social Determinants of Health   Financial Resource Strain:   . Difficulty of Paying Living Expenses:   Food Insecurity:   . Worried About Charity fundraiser in the Last Year:   . Arboriculturist in the Last Year:   Transportation Needs:   . Film/video editor (Medical):   Marland Kitchen Lack of Transportation (Non-Medical):   Physical Activity:   . Days of Exercise per Week:   . Minutes of Exercise per Session:   Stress:   . Feeling of Stress :   Social Connections:   . Frequency of Communication with Friends and Family:   . Frequency of Social Gatherings with Friends and Family:   . Attends Religious Services:   . Active Member of Clubs or Organizations:   . Attends Archivist Meetings:   Marland Kitchen Marital Status:     Additional social history is notable in that he is divorced x2.  He has 3 children and his oldest son lives in [place].  His second son currently had lived [place] and was married in [place].  In June 2020 he moved back to the [place] area.  His daughter works in [place] as a Government social research officer in a Web designer.  There is no tobacco use.  He drinks alcohol.  Family History:  The patient's family history includes Colon cancer in his paternal grandfather.  Mother died at age 75 with emphysema.  His father died at 49.  He has 2 sisters ages 14 and 32.  ROS General: Negative; No fevers, chills, or night sweats; positive for fatigue HEENT: Decreased hearing with hearing aid in right ear, no  sinus congestion, difficulty swallowing Pulmonary: Negative; No cough, wheezing, shortness of breath, hemoptysis Cardiovascular: See HPI GI: History of diverticular disease and colonic polyps. GU: Remote history of genital herpes; erectile dysfunction Musculoskeletal: Negative; no myalgias, joint pain, or weakness Hematologic/Oncology: Negative; no easy bruising, bleeding Endocrine: Positive for hypothyroidism Neuro: Negative; no changes in balance, headaches Skin: Negative; No rashes or skin lesions Psychiatric:  Sleep: Positive snoring snoring, previous fatigue; documented to have severe OSA now on BiPAP therapy; no bruxism, restless legs, hypnogognic hallucinations, no cataplexy Other comprehensive 14 point system review is negative.   PHYSICAL EXAM:   VS:  BP 128/62   Pulse (!) 49   Ht '5\' 9"'$  (1.753 m)   Wt 195 lb (88.5 kg)   SpO2 95%   BMI 28.80 kg/m     Repeat blood pressure 120/76 , Wt Readings from Last 3 Encounters:  04/25/19 195 lb (88.5 kg)  02/19/19 201 lb 6.4 oz (91.4 kg)  02/14/19 200 lb (90.7 kg)    General: Alert, oriented, no distress.  Skin: normal turgor, no rashes, warm and dry HEENT: Normocephalic, atraumatic. Pupils equal round and reactive to light; sclera anicteric; extraocular muscles intact;  Nose without nasal septal hypertrophy Mouth/Parynx benign; Mallinpatti scale 3 Neck: No JVD, no carotid bruits; normal carotid upstroke Lungs: clear to ausculatation and percussion; no wheezing or rales Chest wall: without tenderness to palpitation Heart: PMI not displaced, regular rhythm, bradycardic with heart rates in the 50s, asymptomatic, s1 s2 normal, 1/6 systolic murmur, no diastolic murmur, no rubs, gallops, thrills, or heaves Abdomen: soft, nontender; no hepatosplenomehaly, BS+; abdominal aorta nontender and not dilated by palpation. Back: no CVA tenderness Pulses 2+ Musculoskeletal: full range of motion, normal strength, no joint  deformities Extremities: no clubbing cyanosis or edema, Homan's sign negative  Neurologic: grossly nonfocal; Cranial nerves grossly wnl Psychologic: Normal mood and affect   Studies/Labs Reviewed:   ECG (independently read by me): Sinus Bradycardia at 51; no STT changes; QTc 442 msec; no ectopy  January 16, 2019 ECG (independently read by me): Sinus bradycardia at 57 bpm with possible left atrial enlargement.  PR interval 176 ms, QTc interval 476 ms.  No ST segment changes.  January 02, 2019 ECG (independently read by me): Atrial flutter with 2-1 AV conduction with a ventricular rate of 130 bpm.  December 21, 2018 ECG (independently read by me): Atrial flutter with variable block at 115, QT 402,QTC 556 msec  Cardioversion November 20, 2018 with restoration of sinus rhythm.  November 15, 2018 ECG (independently read by me): Atrial Flutter with variable block at 97 bpm  September 2020 ECG (independently read by me): Atrial flutter with variable block with an average heart rate at 115 bpm  October 29, 2018 ECG (independently read by me): Atrial flutter with variable block 130 bpm.  No specific ST-T changes  October 08, 2018 ECG (independently read by me): NSR at 60; isolated PVC; QTc 434 msec.  April 02, 2018 ECG (independently read by me): NSR 62; Nonspecific T change; QTc 452 msec  December 29, 2017 ECG (independently read by me): Sinus bradycardia at 49 bpm.  Nonspecific ST changes.  QTc interval 464 ms.  No ectopy.  ECG (independently read by me): Sinus rhythm at 64 bpm.  Right bundle branch block with repolarization changes.  No ectopy.  October 24, 2017 ECG (independently read by  me): Normal sinus rhythm at 62 bpm.  Right bundle branch block with repolarization changes.   ECG  April 18, 2014 revealed normal sinus rhythm.  Right bundle branch block was not present.  Recent Labs: BMP Latest Ref Rng & Units 01/16/2019 01/10/2019 11/16/2018  Glucose 65 - 99 mg/dL 79 116(H)  103(H)  BUN 8 - 27 mg/dL '18 14 14  '$ Creatinine 0.76 - 1.27 mg/dL 0.75(L) 0.90 0.87  BUN/Creat Ratio 10 - '24 24 16 16  '$ Sodium 134 - 144 mmol/L 139 138 138  Potassium 3.5 - 5.2 mmol/L 4.4 4.8 4.8  Chloride 96 - 106 mmol/L 102 101 100  CO2 20 - 29 mmol/L '21 21 22  '$ Calcium 8.6 - 10.2 mg/dL 8.7 9.4 9.2     Hepatic Function Latest Ref Rng & Units 11/16/2018 10/05/2018 04/03/2018  Total Protein 6.0 - 8.5 g/dL 6.6 6.7 6.8  Albumin 3.7 - 4.7 g/dL 4.1 4.6 4.3  AST 0 - 40 IU/L 37 46(H) 32  ALT 0 - 44 IU/L 41 49(H) 32  Alk Phosphatase 39 - 117 IU/L 124(H) 91 183(H)  Total Bilirubin 0.0 - 1.2 mg/dL 0.4 0.4 0.4  Bilirubin, Direct 0.00 - 0.40 mg/dL - - -    CBC Latest Ref Rng & Units 01/16/2019 01/10/2019 11/16/2018  WBC 3.4 - 10.8 x10E3/uL 6.4 7.7 7.8  Hemoglobin 13.0 - 17.7 g/dL 14.3 15.4 14.0  Hematocrit 37.5 - 51.0 % 41.9 45.2 40.2  Platelets 150 - 450 x10E3/uL 159 204 211   Lab Results  Component Value Date   MCV 95 01/16/2019   MCV 95 01/10/2019   MCV 96 11/16/2018   Lab Results  Component Value Date   TSH 4.050 01/16/2019   Lab Results  Component Value Date   HGBA1C 5.4 11/15/2017     BNP    Component Value Date/Time   BNP 131.4 (H) 10/05/2018 1010    ProBNP No results found for: PROBNP   Lipid Panel     Component Value Date/Time   CHOL 143 10/05/2018 1010   TRIG 142 10/05/2018 1010   HDL 48 10/05/2018 1010   CHOLHDL 3.0 10/05/2018 1010   CHOLHDL 3.4 01/29/2007 0410   VLDL 17 01/29/2007 0410   LDLCALC 67 10/05/2018 1010     RADIOLOGY: No results found.   Additional studies/ records that were reviewed today include:  I reviewed his prior office visits and most recent DC cardioversion.  CONCLUSIONS:  1. Isthmus-dependent right atrial flutter upon presentation.  2. Successful radiofrequency ablation of atrial flutter along the cavotricuspid isthmus with complete bidirectional isthmus block achieved.  3. No inducible arrhythmias following ablation.  4. No  early apparent complications.   Cristopher Peru, MD  12:37 PM 01/11/2019   SPLIT NIGHT SLEEP STUDY: 01/25/2019 SLEEP STUDY TECHNIQUE As per the AASM Manual for the Scoring of Sleep and Associated Events v2.3 (April 2016) with a hypopnea requiring 4% desaturations.  The channels recorded and monitored were frontal, central and occipital EEG, electrooculogram (EOG), submentalis EMG (chin), nasal and oral airflow, thoracic and abdominal wall motion, anterior tibialis EMG, snore microphone, electrocardiogram, and pulse oximetry. Bi-level positive airway pressure (BiPAP) was initiated when the patient met split night criteria and was titrated according to treat sleep-disordered breathing.  RESPIRATORY PARAMETERS Diagnostic Total AHI (/hr):            63.5     RDI (/hr):         65.8     OA Index (/hr):  17.7     CA Index (/hr):      5.9 REM AHI (/hr):            N/A      NREM AHI (/hr):          63.5     Supine AHI (/hr):         63.5     Non-supine AHI (/hr):        N/A Min O2 Sat (%):          81.0     Mean O2 (%):  93.0     Time below 88% (min):           18.7       Titration Optimal IPAP Pressure (cm): 18        Optimal EPAP Pressure (cm):            14        AHI at Optimal Pressure (/hr):          3.6       Min O2 at Optimal Pressure (%):       93.0 Sleep % at Optimal (%):         97        Supine % at Optimal (%):       7            SLEEP ARCHITECTURE The study was initiated at 10:10:34 PM and terminated at 4:34:09 AM. The total recorded time was 383.6 minutes. EEG confirmed total sleep time was 333.3 minutes yielding a sleep efficiency of 86.9%%. Sleep onset after lights out was 0.8 minutes with a REM latency of 195.0 minutes. The patient spent 1.5%% of the night in stage N1 sleep, 80.9%% in stage N2 sleep, 0.0%% in stage N3 and 17.6% in REM. Wake after sleep onset (WASO) was 49.5 minutes. The Arousal Index was 32.0/hour.  LEG MOVEMENT DATA The total Periodic Limb Movements  of Sleep (PLMS) were 0. The PLMS index was 0.0 .  CARDIAC DATA The 2 lead EKG demonstrated sinus rhythm. The mean heart rate was 100.0 beats per minute. Other EKG findings include: None.  IMPRESSIONS - Severe obstructive sleep apnea occurred during the diagnostic portion of the study (AHI 63.5 /h; RDI 65.8/h). CPAP was initiated at 5 cm and was titrated to 9 cm . Due to frequent central events, BiPAP was implemented at 11/7 and was titrated to 18/14 cm of water: AHI 3.6/h, O2 nadir 93%. 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 - Moderate oxygen desaturation was noted during the diagnostic portion of the study (Min O2 = 81.0%). - The patient snored with loud snoring volume during the diagnostic portion of the study. - It appears that the patient was in atrial fibrillation throughtout the study. - Clinically significant periodic limb movements of sleep did not occur during the study.  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 A Small size Fisher&Paykel Full Face Mask Simplus mask was used for the titration. If patient continues to experience central events, ASV titration may be necessary. - Effort should be made to optimize nasal and oropharyngeal patency. - Avoid alcohol, sedatives and other CNS depressants that may worsen sleep apnea and disrupt normal sleep architecture. - Sleep hygiene should be reviewed to assess  factors that may improve sleep quality. - Weight management and regular exercise should be initiated or continued. - Recoomend f/u cardiology evalution for probable recurrent atrial fibrillation.  - Recommend a download in 30 days and sleep clinic evaluation after 30 days of therapy.  [Electronically signed] 01/27/2019 04:13 PM  Shelva Majestic MD, [facility],  [place], American Board of Sleep Medicine   ASSESSMENT:    No diagnosis found. 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  He has a history of mild hyperlipidemia and remotely had been on simvastatin 40 mg daily and has been followed by Dr. Hulan Fess.  In the fall 2019 he began to notice mild shortness of breath particularly with walking up steps.  An echo Doppler study revealed hyperdynamic LV function with an EF of 65 to 70% but with grade 2 diastolic dysfunction and abnormal tissue Doppler.  A CT coronary angiogram was suggestive of significant multivessel CAD and was FFR positive.  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 Roxy Horseman.  His postoperative course was complicated by paroxysmal atrial fibrillation for which he was started on amiodarone and ultimately was discharged on Eliquis.  When I saw him for initial post hospital evaluation he was still on amiodarone 200 mg twice a day, metoprolol tartrate 25 mg twice a day.  His ECG showed sinus bradycardia 5 weeks status post CABG revascularization; amiodarone was decreased to 200 mg daily and subsequently discontinued when subsequent laboratory showed LFT elevation and further increase in his TSH level.  With his hypothyroidism he required increasing doses of levothyroxine, ultimately titrated to his present dose of 150 mcg daily.  He had developed episodes of atrial flutter and underwent initial cardioversion in October 2020.  Due to recurrent atrial flutter he was ultimately referred to Dr. Lovena Le and underwent successful a flutter ablation on January 12, 2019.  Due to concerns for obstructive sleep apnea he ultimately was able to have his sleep study which confirmed my suspicion.  He was found to have severe sleep apnea with an AHI of over 60 times  per hour with significant oxygen desaturation to a nadir of 80%.  He has been on BiPAP therapy for 9 days and already notes marked improvement.  Of note he had run out of his metoprolol 2 days prior to his sleep study and he was back in atrial fibrillation during his sleep evaluation.  His ECG today back on metoprolol 75 mg twice a day demonstrates sinus rhythm with asymptomatic sinus bradycardia 51 bpm.  Depending upon his heart rate, the and with continued treatment of his BiPAP therapy it may be possible in the future if necessary to reduce metoprolol down to 50 mg twice a day.  I thoroughly reviewed his split-night sleep study with him in detail today.  He already has noticed marked benefit in his previous significant fatigability.  I am recommending slight changes and will adjust his initial ramp pressure to start at 6 rather than 4.  I will change his ramp time to 20 minutes.  Rather than a fixed 18/14 pressure I will change him to an auto mode with initial EPAP minimum of 12 and IPAP maximum of 22 with pressure support of 4.  I feel some of his mask leak may be due to the fact that he is tightening his mask  too tightly which is a [place].  I will obtain another download in 1 month for reassessment and will need to see him prior to 90 days following CPAP initiation form compliance mandate per Medicare.  I reviewed his most recent echo Doppler study which continues to show normal systolic function.  His shortness of breath has improved.  His most recent [place] study was low risk without any areas of ischemia now 14 months status post revascularization surgery.  I will see him in March for follow-up cardiology/sleep evaluation.   Levothyroxine dose was increased several occasions earlier this summer was increased to 125 mcg following an increased level by his primary physician.  TSH from October 05, 2018 showed improvement but TSH was still increased at 15.9 and I further titrated levothyroxine  to 150 mcg.  He was maintaining sinus rhythm and denied any awareness of recurrent palpitations or arrhythmia.  In September, he had inadvertently missed several morning doses of metoprolol and was noted by his apple watch to have a tachycardic heart rate in the 130s.  Subsequent office follow-up demonstrated atrial flutter.  His dose of beta-blocker was ultimately titrated to 100 twice a day and he underwent successful cardioversion on November 20, 2018.  He had transient hypotension following cardioversion back to sinus rhythm which responded to fluids and Neo-Synephrine 40 mcg x 4.  Before he awakened, he had loud snoring.  I discussed with him the need for sleep evaluation for sleep apnea which I had discussed previously.  He had not yet returned to the office to see me but since his cardioversion instead of reducing his metoprolol down to 75 mg twice a day with plans to switch to long-acting succinate he had been inadvertently taking only 25 mg twice a day.  In addition, he developed sinus congestion and began taking OTC sinus medication with pseudoephedrine which undoubtedly contributed to development of recurrent arrhythmia.  I saw him as an add-on on December 21, 2018 his his ECG  demonstrated atrial flutter with variable block at a rate of 115.  He believes he may have been in this for the past 5 to 7 days but since yesterday by my phone conversation he increased metoprolol to 50 mg twice a day last evening and this morning until his visit presently.  At his office visit following day on December 21, 2018 with his pulse still in the 90s to 115 range I increased metoprolol back to 100 mg twice a day.  In that evaluation I discussed EP referral for atrial flutter ablation.  I strongly recommended discontinuing all pseudoephedrine products.  He preferred to see if the increase metoprolol would be effective.  On reevaluation 1 week later he continued to be in atrial flutter with 2-1 block at a ventricular rate  of 130 despite being on metoprolol 100 mg twice daily.  He underwent successful atrial flutter ablation for isthmus dependent right atrial flutter by Dr. Lovena Le on January 11, 2019.  Over the last several days he has noticed increasing shortness of breath.  He is markedly fatigued.  He denies chest tightness or pressure.  However in the past he had never experienced any chest tightness or pressure prior to his CABG revascularization surgery.  He is unaware of any bleeding.  His ECG confirms maintenance of sinus rhythm with sinus bradycardia at 57 bpm.  There are no ST segment changes.  I am checking a bmp, TSH and CBC today.  He continues to be on  levothyroxine 150 mcg. I suspect a lot of his fatigability may be contributed by his high-dose metoprolol and bradycardia.  He also is aerobically deconditioned.  For now I am recommending slight reduction of metoprolol to 75 mg twice daily with plans for probable further reduction as tolerated and after further discussion with Dr. Lovena Le.  Since he had been in rapid atrial flutter for several weeks duration I am also recommending a follow-up echo Doppler study to make certain there has not been any decline in LV function and also to make certain he does not have any evidence for pericardial effusion.  I have also recommended he undergo a [place] study since he is 14 months status post CABG revascularization surgery and he had never experienced chest pain prior to surgery but his major symptom was that of exertional dyspnea.  I will obtain a PA and lateral chest x-ray.  He is scheduled to undergo his sleep study on January 25, 2019 which I suspect will be positive for obstructive sleep apnea.  I will see him in 3 to 4 weeks for reevaluation.  Time spent: 30 minutes  Medication Adjustments/Labs and Tests Ordered: Current medicines are reviewed at length with the patient today.  Concerns regarding medicines are outlined above.  Medication changes, Labs and  Tests ordered today are listed in the Patient Instructions below. Patient Instructions  Medication Instructions:  DECREASE YOUR METOPROLOL TARTRATE. TAKE '75MG'$  (1 AND 1/2 TAB) IN THE MORNING AND '50MG'$  (1 TAB) IN THE EVENING *If you need a refill on your cardiac medications before your next appointment, please call your pharmacy*    Follow-Up: At [facility], you and your health needs are our priority.  As part of our continuing mission to provide you with exceptional heart care, we have created designated Provider Care Teams.  These Care Teams include your primary Cardiologist (physician) and Advanced Practice Providers (APPs -  Physician Assistants and Nurse Practitioners) who all work together to provide you with the care you need, when you need it.  We recommend signing up for the patient portal called "MyChart".  Sign up information is provided on this After Visit Summary.  MyChart is used to connect with patients for Virtual Visits (Telemedicine).  Patients are able to view lab/test results, encounter notes, upcoming appointments, etc.  Non-urgent messages can be sent to your provider as well.   To learn more about what you can do with MyChart, go to [email or web address].    Your next appointment:   3 month(s)  The format for your next appointment:   In Person  Provider:   Shelva Majestic, MD       Signed, Shelva Majestic, MD  04/25/2019 1:12 PM    [facility] [address] [place], [place],   [address] Phone: [phone]

## 2019-04-26 ENCOUNTER — Other Ambulatory Visit: Payer: Self-pay | Admitting: Internal Medicine

## 2019-04-27 ENCOUNTER — Encounter: Payer: Self-pay | Admitting: Cardiovascular Disease

## 2019-04-30 ENCOUNTER — Other Ambulatory Visit (INDEPENDENT_AMBULATORY_CARE_PROVIDER_SITE_OTHER): Payer: PPO

## 2019-04-30 DIAGNOSIS — I10 Essential (primary) hypertension: Secondary | ICD-10-CM | POA: Diagnosis not present

## 2019-05-05 DIAGNOSIS — G4733 Obstructive sleep apnea (adult) (pediatric): Secondary | ICD-10-CM | POA: Diagnosis not present

## 2019-05-15 ENCOUNTER — Telehealth: Payer: Self-pay

## 2019-05-15 MED ORDER — CIPROFLOXACIN HCL 500 MG PO TABS: 500.0000 mg | ORAL_TABLET | Freq: Two times a day (BID) | ORAL | 0 refills | Status: DC

## 2019-05-15 NOTE — Telephone Encounter (Signed)
Irene Shipper, MD  Algernon Huxley, RN  Vaughan Basta, Mr. [place] thinks he is having a bout of diverticulitis. His number is below. 1. Prescribe Cipro 500 mg twice daily x10 days 2. Schedule him office follow-up in 1 week (advanced practitioner is fine) 3. Tell him to contact us in the interim if he has problems. For severe pain go to ER 4. Please convert this entire conversation to a phone note so that it is easily accessible in the medical record. Thanks, Dr. Henrene Pastor       Previous Messages   ----- Message -----  From: Thelma Barge, Ronald Lobo  Sent: 05/15/2019  2:37 PM EDT  To: Irene Shipper, MD  Subject: Message from a Friend               Hi Dr. Henrene Pastor   A gentleman called Nadyne Coombes just called stating that he is a friend of yours and would like to speak with you. He said that he lost your phone number. Please call him at your earliest convenience, his phone number is [phone].   Thank you,   Tye [place]    Script sent to pharmacy. Pt scheduled to see Amy Esterwood PA 05/23/19@1 :30pm. Pt aware.

## 2019-05-16 NOTE — Telephone Encounter (Signed)
Patient calling back would like to know if he can still take the magnesium along with his medications.

## 2019-05-16 NOTE — Telephone Encounter (Signed)
Patient is calling he said he forgot the diet plan that he was recommended to start.

## 2019-05-16 NOTE — Telephone Encounter (Signed)
Spoke with pt and let him know he needs to follow a low residue diet. Pt verbalized understanding.

## 2019-05-16 NOTE — Telephone Encounter (Signed)
Spoke with pt and let him know he can take magnesium.

## 2019-05-20 DIAGNOSIS — H6123 Impacted cerumen, bilateral: Secondary | ICD-10-CM | POA: Diagnosis not present

## 2019-05-21 DIAGNOSIS — H353132 Nonexudative age-related macular degeneration, bilateral, intermediate dry stage: Secondary | ICD-10-CM | POA: Diagnosis not present

## 2019-05-21 DIAGNOSIS — H524 Presbyopia: Secondary | ICD-10-CM | POA: Diagnosis not present

## 2019-05-23 ENCOUNTER — Ambulatory Visit: Payer: PPO | Admitting: Physician Assistant

## 2019-05-23 ENCOUNTER — Encounter: Payer: Self-pay | Admitting: Physician Assistant

## 2019-05-23 VITALS — BP 124/64 | HR 84 | Temp 98.2°F | Ht 68.0 in | Wt 195.0 lb

## 2019-05-23 DIAGNOSIS — Z86010 Personal history of colonic polyps: Secondary | ICD-10-CM | POA: Diagnosis not present

## 2019-05-23 MED ORDER — CIPROFLOXACIN HCL 500 MG PO TABS: 500.0000 mg | ORAL_TABLET | Freq: Two times a day (BID) | ORAL | 0 refills | Status: DC

## 2019-05-23 NOTE — Progress Notes (Signed)
Subjective:    Patient ID: Carl [name], male    DOB: Nov 07, 1945, 74 y.o.   MRN: [id number]  HPI Carl [name] is a pleasant 74 year old white male, known to Dr. Henrene Pastor, who comes in today for follow-up after recent episode of diverticulitis.  Patient has history of recurrent diverticulitis.  He had called on 05/15/2019 after he had had left lower quadrant abdominal pain for couple of days.  He was started on a course of Cipro 500 mg p.o. twice daily x10 days. He says he feels completely better at this point, and has a couple of days worth of antibiotics to complete.  He did not develop any fever or chills, no rectal bleeding.  He has not been having any issues with constipation.  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  Path on the polyps consistent with adenomatous polyps and indicated for 5-year interval follow-up.  Patient had an EGD in 2019 with dilation of esophageal stricture. He has remote history of gastric ulcer, history of coronary artery disease status post CABG x4 and atrial fibrillation for whichshe is on Eliquis.  Review of Systems Pertinent positive and negative review of systems were noted in the above HPI section.  All other review of systems was otherwise negative.  Outpatient Encounter Medications as of 05/23/2019  Medication Sig  . acetaminophen (TYLENOL) 500 MG tablet Take 1,000 mg by mouth every 6 (six) hours as needed for moderate pain or headache.  Marland Kitchen acyclovir (ZOVIRAX) 400 MG tablet Take 400 mg by mouth 2 (two) times daily.  Marland Kitchen aspirin EC 81 MG tablet Take 1 tablet (81 mg total) by mouth daily.  . ciprofloxacin (CIPRO) 500 MG tablet Take 1 tablet (500 mg total) by mouth 2 (two) times daily.  . citalopram (CELEXA) 20 MG tablet Take 20 mg by mouth daily.  Marland Kitchen ELIQUIS 5 MG TABS tablet TAKE 1 TABLET(5 MG) BY MOUTH TWICE DAILY  . ferrous sulfate 325 (65 FE) MG tablet  Take 325 mg by mouth daily.  . Glucosamine HCl (GLUCOSAMINE PO) Take 1 tablet by mouth 2 (two) times daily.  Marland Kitchen levothyroxine (SYNTHROID) 150 MCG tablet Take 1 tablet (150 mcg total) by mouth daily.  . metoprolol tartrate (LOPRESSOR) 50 MG tablet TAKE 75MG  (1 AND 1/2 TAB) IN THE MORNING AND 50MG  (1 TAB) AT NIGHT  . Multiple Vitamins-Minerals (PRESERVISION AREDS 2 PO) Take 1 tablet by mouth 2 (two) times daily.  . pantoprazole (PROTONIX) 20 MG tablet TAKE 2 TABLETS(40 MG) BY MOUTH DAILY. MUST HAVE OFFICE VISIT FOR FURTHER REFILLS  . rosuvastatin (CRESTOR) 20 MG tablet Take 1 tablet (20 mg total) by mouth daily.  . sildenafil (REVATIO) 20 MG tablet Take 60 mg by mouth daily as needed for erectile dysfunction.  Marland Kitchen VASCEPA 1 g capsule TAKE 2 CAPSULES(2 GRAMS) BY MOUTH TWICE DAILY  . zolpidem (AMBIEN) 10 MG tablet Take 10 mg by mouth at bedtime.   . ciprofloxacin (CIPRO) 500 MG tablet Take 1 tablet (500 mg total) by mouth 2 (two) times daily.  . hydrochlorothiazide (MICROZIDE) 12.5 MG capsule Take 1 capsule (12.5 mg total) by mouth as needed (swelling).   No facility-administered encounter medications on file as of 05/23/2019.   No Known Allergies Patient Active Problem List   Diagnosis Date Noted  . Typical atrial flutter ([place])   . TSH elevation 11/21/2017  . Atrial fibrillation with  rapid ventricular response ([place])   . 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 CAD in native artery 11/15/2017  . Abnormal cardiac CT angiography   . RLQ abdominal pain 07/07/2015  . Primary osteoarthritis of left knee 06/17/2014  . 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 Preoperative cardiovascular examination 04/19/2014  . Hyperlipidemia LDL goal <70 04/19/2014  . AP (abdominal pain) 01/16/2014  . History of diverticulitis 01/16/2014  . GERD 01/14/2009  . ULCER-GASTRIC 01/14/2009  . GASTRITIS 11/14/2008  . DIVERTICULITIS OF COLON 11/13/2008  . BLOOD IN STOOL 11/13/2008  . ABDOMINAL PAIN 11/13/2008  .  PERSONAL HISTORY OF COLONIC POLYPS 11/13/2008   Social History   Socioeconomic History  . Marital status: Single    Spouse name: Not on file  . Number of children: 3  . Years of education: Not on file  . Highest education level: Not on file  Occupational History  . Occupation: Owner  Tobacco Use  . Smoking status: Never Smoker  . Smokeless tobacco: Never Used  Substance and Sexual Activity  . Alcohol use: Yes    Alcohol/week: 10.0 standard drinks    Types: 10 Standard drinks or equivalent per week    Comment: 2 drinks 5 nights per week  . Drug use: No  . Sexual activity: Never  Other Topics Concern  . Not on file  Social History Narrative  . Not on file   Social Determinants of Health   Financial Resource Strain:   . Difficulty of Paying Living Expenses:   Food Insecurity:   . Worried About Charity fundraiser in the Last Year:   . Arboriculturist in the Last Year:   Transportation Needs:   . Film/video editor (Medical):   Marland Kitchen Lack of Transportation (Non-Medical):   Physical Activity:   . Days of Exercise per Week:   . Minutes of Exercise per Session:   Stress:   . Feeling of Stress :   Social Connections:   . Frequency of Communication with Friends and Family:   . Frequency of Social Gatherings with Friends and Family:   . Attends Religious Services:   . Active Member of Clubs or Organizations:   . Attends Archivist Meetings:   Marland Kitchen Marital Status:   Intimate Partner Violence:   . Fear of Current or Ex-Partner:   . Emotionally Abused:   Marland Kitchen Physically Abused:   . Sexually Abused:     Carl [name] family history includes Colon cancer in his paternal grandfather.      Objective:    Vitals:   05/23/19 1338  BP: 124/64  Pulse: 84  Temp: 98.2 F (36.8 C)    Physical Exam Well-developed well-nourished older white male in no acute distress.  Height, Weight, 195 BMI 29.65  HEENT; nontraumatic normocephalic, EOMI, PER R LA, sclera  anicteric. Oropharynx; not examined Neck; supple, no JVD Cardiovascular; regular rate and rhythm with S1-S2, no murmur rub or gallop, sternal incisional scar Pulmonary; Clear bilaterally Abdomen; soft, nontender, nondistended, no palpable mass or hepatosplenomegaly, bowel sounds are active Rectal; not done today Skin; benign exam, no jaundice rash or appreciable lesions Extremities; no clubbing cyanosis or edema skin warm and dry Neuro/Psych; alert and oriented x4, grossly nonfocal mood and affect appropriate      Assessment & Plan:   #71 74 year old white male with history of recurrent diverticulitis, who comes in today after calling about a week ago with recurrent left lower quadrant pain.  He  is completing a 10-day course of Cipro 500 twice daily and symptoms have completely resolved.  #2 history of adenomatous colon polyps-due for follow-up colonoscopy December 2021  #3 cecal AVM #4.  Chronic anticoagulation-on Eliquis #5 atrial fibrillation #6.  Coronary artery disease status post CABG #7 chronic GERD with history of peptic stricture status post dilation 2019  Plan; he will complete 10 days of Cipro 500 twice daily. He asked for refill on Cipro which was sent, in event he develops symptoms on the weekend or while out of town.  He is advised that if he has another recurrence to call even if he starts himself on antibiotics so we can assess for need for CT etc.  We will plan follow-up colonoscopy December 2021, and asked him to come in for an appointment in October to get scheduled.    Braxton Weisbecker S Mariella Blackwelder PA-C 05/23/2019   Cc: Hulan Fess, MD

## 2019-05-23 NOTE — Patient Instructions (Addendum)
If you are age 75 or older, your body mass index should be between 23-30. Your Body mass index is 29.65 kg/m. If this is out of the aforementioned range listed, please consider follow up with your Primary Care Provider.  If you are age 56 or younger, your body mass index should be between 19-25. Your Body mass index is 29.65 kg/m. If this is out of the aformentioned range listed, please consider follow up with your Primary Care Provider.   Finish you current round of Cipro. A refill has been sent to your pharmacy to be place on on hold.  Call the office if you have any issues.   You are due for your colonoscopy in December, please schedule an appointment in October for your Colonoscopy prep.

## 2019-05-24 NOTE — Progress Notes (Signed)
Noted. Thanks Amy. °

## 2019-05-29 DIAGNOSIS — I2510 Atherosclerotic heart disease of native coronary artery without angina pectoris: Secondary | ICD-10-CM | POA: Diagnosis not present

## 2019-05-29 DIAGNOSIS — G459 Transient cerebral ischemic attack, unspecified: Secondary | ICD-10-CM | POA: Diagnosis not present

## 2019-05-29 DIAGNOSIS — I480 Paroxysmal atrial fibrillation: Secondary | ICD-10-CM | POA: Diagnosis not present

## 2019-05-29 DIAGNOSIS — E785 Hyperlipidemia, unspecified: Secondary | ICD-10-CM | POA: Diagnosis not present

## 2019-05-29 DIAGNOSIS — E782 Mixed hyperlipidemia: Secondary | ICD-10-CM | POA: Diagnosis not present

## 2019-05-29 DIAGNOSIS — H409 Unspecified glaucoma: Secondary | ICD-10-CM | POA: Diagnosis not present

## 2019-05-29 DIAGNOSIS — I10 Essential (primary) hypertension: Secondary | ICD-10-CM | POA: Diagnosis not present

## 2019-05-29 DIAGNOSIS — E032 Hypothyroidism due to medicaments and other exogenous substances: Secondary | ICD-10-CM | POA: Diagnosis not present

## 2019-05-30 ENCOUNTER — Other Ambulatory Visit: Payer: Self-pay

## 2019-05-30 MED ORDER — TRAMADOL HCL 50 MG PO TABS: ORAL_TABLET | ORAL | 0 refills | Status: DC

## 2019-05-30 NOTE — Progress Notes (Signed)
tra

## 2019-06-05 DIAGNOSIS — G4733 Obstructive sleep apnea (adult) (pediatric): Secondary | ICD-10-CM | POA: Diagnosis not present

## 2019-06-07 ENCOUNTER — Other Ambulatory Visit: Payer: Self-pay | Admitting: Cardiovascular Disease

## 2019-06-26 DIAGNOSIS — D225 Melanocytic nevi of trunk: Secondary | ICD-10-CM | POA: Diagnosis not present

## 2019-06-26 DIAGNOSIS — R5383 Other fatigue: Secondary | ICD-10-CM | POA: Diagnosis not present

## 2019-06-26 DIAGNOSIS — L570 Actinic keratosis: Secondary | ICD-10-CM | POA: Diagnosis not present

## 2019-06-26 DIAGNOSIS — R748 Abnormal levels of other serum enzymes: Secondary | ICD-10-CM | POA: Diagnosis not present

## 2019-07-05 DIAGNOSIS — G4733 Obstructive sleep apnea (adult) (pediatric): Secondary | ICD-10-CM | POA: Diagnosis not present

## 2019-07-23 ENCOUNTER — Telehealth: Payer: Self-pay | Admitting: Cardiovascular Disease

## 2019-07-23 NOTE — Telephone Encounter (Signed)
Routed to MD/RN 

## 2019-07-23 NOTE — Telephone Encounter (Signed)
New Message  Patient is calling in and would like to speak with [name] about his heart health. States that it is not a medical emergency or anything, but would just like to run some things by [name] when he is back in office. Please give patient a call back.

## 2019-07-29 NOTE — Telephone Encounter (Signed)
Dr.Kelly aware.

## 2019-07-30 ENCOUNTER — Other Ambulatory Visit: Payer: Self-pay | Admitting: Internal Medicine

## 2019-08-01 ENCOUNTER — Other Ambulatory Visit: Payer: Self-pay

## 2019-08-01 ENCOUNTER — Encounter: Payer: Self-pay | Admitting: Cardiovascular Disease

## 2019-08-01 ENCOUNTER — Ambulatory Visit: Payer: PPO | Admitting: Cardiovascular Disease

## 2019-08-01 VITALS — BP 110/73 | HR 53 | Temp 98.1°F | Ht 69.0 in | Wt 203.6 lb

## 2019-08-01 DIAGNOSIS — Z7901 Long term (current) use of anticoagulants: Secondary | ICD-10-CM | POA: Diagnosis not present

## 2019-08-01 DIAGNOSIS — Z8679 Personal history of other diseases of the circulatory system: Secondary | ICD-10-CM | POA: Diagnosis not present

## 2019-08-01 DIAGNOSIS — Z951 Presence of aortocoronary bypass graft: Secondary | ICD-10-CM

## 2019-08-01 DIAGNOSIS — I2510 Atherosclerotic heart disease of native coronary artery without angina pectoris: Secondary | ICD-10-CM | POA: Diagnosis not present

## 2019-08-01 DIAGNOSIS — E785 Hyperlipidemia, unspecified: Secondary | ICD-10-CM

## 2019-08-01 DIAGNOSIS — E039 Hypothyroidism, unspecified: Secondary | ICD-10-CM | POA: Diagnosis not present

## 2019-08-01 DIAGNOSIS — I480 Paroxysmal atrial fibrillation: Secondary | ICD-10-CM | POA: Diagnosis not present

## 2019-08-01 DIAGNOSIS — G4733 Obstructive sleep apnea (adult) (pediatric): Secondary | ICD-10-CM

## 2019-08-01 DIAGNOSIS — Z9889 Other specified postprocedural states: Secondary | ICD-10-CM | POA: Diagnosis not present

## 2019-08-01 MED ORDER — METOPROLOL TARTRATE 50 MG PO TABS: 50.0000 mg | ORAL_TABLET | Freq: Two times a day (BID) | ORAL | 3 refills | Status: DC

## 2019-08-01 MED ORDER — NITROGLYCERIN 0.4 MG SL SUBL: 0.4000 mg | SUBLINGUAL_TABLET | SUBLINGUAL | 3 refills | Status: DC | PRN

## 2019-08-01 NOTE — Progress Notes (Signed)
Cardiology Office Note    Date:  08/02/2019   ID:  Carl [name], DOB 07-Jul-1945, MRN [id number]  PCP:  Hulan Fess, MD  Cardiologist:  Shelva Majestic, MD   31-monthfollow-up evaluation with recent increased fatigue  History of Present Illness:  Carl ELIZONDOis a 74y.o. male who presents to the office today for a 3 month follow-up evaluation of his atrial flutter ablation and sleep study with BiPAP initiation.  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  In 2009 he developed transient numbness of his right face which lasted for several minutes and at that time apparently underwent evaluation and had a normal MRI, MRA, and carotid duplex studies.  He was found to have only mild to moderate plaque in the proximal right internal carotid artery without stenosis.  I had seen him in March 2016 for preoperative cardiology clearance prior to undergoing left knee replacement surgery by Carl [name]  Preoperative ECG suggested possible junctional rhythm which was new from an ECG of 2012 which previously had only shown sinus bradycardia.  He was asymptomatic.  When I saw him, his ECG showed sinus rhythm at 61 bpm with normal intervals and on that ECG P waves were normal and upright inferiorly.  Carl [name] fairly active. In 2019  he had not been exercising as much as he had in the past still working out with a trainer and denied any associated chest pain or palpitations.  He in September 201 19 he began to notice more shortness of breath with walking up steps.  He was  at a friend's house and was told that he appeared to be more short of breath than previously.  I saw him for evaluation of his exertional shortness of breath on October 24, 2017.  At that time, I recommended that he undergo a 2D echo Doppler study and scheduled him for coronary CT angiography.  His echo Doppler study demonstrated hyperdynamic LV function with an EF of  65 to 70%.  Doppler parameters suggest grade 2 diastolic dysfunction and elevated ventricular end-diastolic filling pressure.  He had mild MR, mild TR, and mild dilation of his left atrium.  Pulmonary pressures were normal.  CT coronary angiography was performed on November 09, 2017.  This was abnormal and demonstrated an elevated calcium score a 25 which is 78th percentile for age and sex.  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  There was greater than 75% calcific plaque in his proximal and mid diagonal 1 vessel.  The circumflex had 50% proximal plaque.  There was 50 to 75% mixed plaque in the OM 2 vessel less than 50% in the OM1 vessel.  His RCA had 50% calcified plaque proximally, 50 to 75% calcified plaque in the mid vessel.  There was mild aortic root dilatation at 4.1 cm.  [facility] which were positive in the mid RCA 0.78, the mid LAD 0.78 and in the AV groove circumflex at 0.71.   I saw him in follow-up of the above studies and recommended cardiac catheterization.  Catheterization was done in November 15, 2017 which showed severe multivessel CAD with 85% ostial LAD stenosis, diffuse 75% mid LAD stenosis, total occlusion of the mid AV groove circumflex after the takeoff of the second obtuse marginal  vessel with 50% diffuse stenosis in the first large marginal branch, 80 and 90% stenoses in the second obtuse marginal vessel and extensive collateralization to the distal circumflex and third marginal via the LAD.  He had 1650% mid RCA stenoses and a dominant RCA.  Of note, he had probable high right radial artery takeoff with spasm/coarse stenosis above the elbow and there was retrograde filling of the brachial artery and the catheterization was transition to the femoral approach.  He underwent successful CABG revascularization surgery the following day by  Dr. Roxy Horseman on November 16, 2017 with a LIMA graft to his LAD, SVG to first marginal and distal circumflex, and SVG to the PDA of his RCA with left thigh and calf endoscopic vein harvest.  He developed atrial fibrillation with RVR on postop day 2 and was started on IV amiodarone.  He ultimately converted back to sinus rhythm but developed recurrent AF on postop day 4 on metoprolol and amiodarone and ultimately converted back to sinus rhythm.  He was started on Eliquis on day 5 for anticoagulation.  He was discharged on November 22, 2017.  Approximately 10 days later he started to develop left lower extremity swelling and redness with pain in the back of his calf.  He was evaluated by Ellwood Handler, PA-C at [facility] on December 04, 2017.  There was no sign of infection.  Venous duplex imaging did not reveal any DVT.  He saw Dr. Servando Snare for initial follow-up evaluation on December 14, 2017 at which time he was maintaining sinus rhythm.  He will be participating in cardiac rehab and his orientation is scheduled for February 15, 2018.  He denied any recurrent chest pain.  His breathing has improved with walking.  He went to the beach and was walking at least a mile per day.  He is sleeping better and snoring is less.    Since his November 2019 and prior to his February 2020 evaluation he was doing well and was  participating in cardiac rehabilitation and also had joined a gym. He was exercising regularly.  He denied chest pain, PND, or orthopnea.  Subsequent laboratory showed further TSH elevation.  His levothyroxine was increased to 75 mcg.  Amiodarone was discontinued due to LFT elevation and his dose of rosuvastatin was reduced to one half of the current dose.  I  saw him in February 2020 at that time he had not had follow-up laboratory.    He had had follow-up laboratory which continued to show hypothyroidism and his dose of levothyroxine has gradually been increased.  Recent laboratory done by Dr. Rex Kras several months  ago continues to show a TSH was elevated and his levothyroxine dose was increased from 100 to 125 mcg.  During the COVID-19 pandemic, he admits to weight gain.  He has not been exercising as much as he had in the past.  Although he felt well, when recently seen by Dr. Durward [name] he was felt to potentially be more short of breath.  He was advised to see me back for follow-up evaluation.  I scheduled him to undergo repeat laboratory which was done on October 05, 2018.  Hemoglobin 15.4, hematocrit 44.2.  Lipid studies were significantly improved from 6 months previously with total cholesterol now 143, triglycerides 142, HDL 48, LDL 67.  Brain natruretic peptide was 131.  TSH had significantly improved but was still elevated at 15.9.  He had normal renal function with a BUN of 15 creatinine 0.96.  LFTs were significantly improved  but minimally increased with an AST of 46 and ALT at 49.  He denied any chest pain or awareness of palpitations.  I saw him for reevaluation on October 08, 2018.  During that evaluation, I reviewed his laboratory and felt he was euvolemic.  His ECG showed normal sinus rhythm at 60 bpm with an isolated PVC.  His QTc interval was 434 ms.  With his continued TSH elevation, levothyroxine was further titrated to 150 mcg.  Subsequently, Carl [name] has continued to feel well.  He denied any awareness of palpitations.  Last week, in October 29, 2018 when checking his Apple watch he saw report that his heart rate was increased in the upper 130s.  He was unaware of this irregularity.  Upon further questioning, he had to go out of town over the last several days and believes he may have missed taking his morning metoprolol dose on Friday and Saturday and he could not remember if he had taken it yesterday morning.  He was supposed to be taken metoprolol tartrate 25 mg twice a day, HCTZ on a as needed basis which he has not needed.  He continues to be on Eliquis 5 mg twice a day and is tolerated the  levothyroxine 150 mcg dose.  He is now on the vascepa which was just renewed last week 2 capsules twice a day, rosuvastatin 20 mg daily for mixed hyperlipidemia.  After receiving his phone call, I advised that he come to the office to check an EKG.  With his ECG showing atrial flutter at a rate of 130 he was added onto my schedule for further evaluation.  During that evaluation, he was asymptomatic and felt well.  His ECG showed atrial flutter with variable block at 130 bpm with nonspecific ST-T changes.  He was on Eliquis for anticoagulation.  I recommended titration of metoprolol tartrate to 50 mg twice a day.  Laboratory drawn that day revealed a TSH 5.95, magnesium 2.3, potassium 4.7, BUN 14 creatinine 0.9.   I saw him on September 28 for follow-up evaluation.  He was feeling well and continue to be unaware of his heart rate irregularity.  However according to his Apple Watch, his heart rate was running in the 115-120 range   He denied chest pain.  He denied tremors.  He was sleeping well and denied any awareness of sleep apnea.  During that evaluation we discussed gradually him for DC cardioversion but this would require COVID-19 testing with quarantine for 3 days prior to having the procedure done.  His daughter was coming into town that weekend.  As result we further titrated his metoprolol to 75 mg twice a day which he did for 4 days and then increase to 200 mg twice a day.  He continues to be on Eliquis for anticoagulation.  He admits to increased fatigue but denies chest pain.   I saw Carl [name] in follow-up on November 15, 2018.  At that time he had titrated the metoprolol up to 100 mg twice a day.  He underwent successful DC cardioversion on November 20, 2018 with restoration of sinus rhythm.  Of note, he developed hypotension prior to awakening from propofol and received normal saline and Neo-Synephrine.  Blood pressure normalized loud snoring was noted, and it was advised to consider sleep study  evaluation.  Over the month after his cardioversion Carl [name] initially did well but he inadvertently reduced his metoprolol from I suggested dose reduction to 75 mg twice a day and apparently has only  been taking 25 mg twice a day.  In addition, he has had difficulty with sinus congestion and was taking medication with pseudoephedrine.  He called the office on 12/20/18  stating that his Apple Watch had noted that his heart rate had been increased for almost a week and when I spoke with him his resting pulse was 135.  At that time I recommended he increase metoprolol to 50 mg twice a day and see me on the following day on December 21, 2018.  During that evaluation, he [name] in atrial flutter with variable block at a rate of 115 bpm.  Recommended titration of metoprolol tartrate to 100 mg twice a day.  I discussed EP evaluation with consideration for atrial flutter ablation.  I again will had a lengthy discussion regarding the need for a sleep study due to high likelihood of obstructive sleep apnea.  He initially preferred trying the metoprolol increased dose to see if this would benefit him prior to an immediate EP evaluation.   I saw him in follow-up on the increase metoprolol 100 mg grams twice a day dosing at that time he was in 2-1 flutter with ventricular rate at 130.  I strongly recommended EP evaluation with atrial flutter ablation and discussed the case with Dr. Lovena Le who agreed to see the patient in the office on December 3 and plan to do atrial flutter ablation on December 4.  Since his sleep study was tentatively scheduled for December 2 I recommended that this be deferred until after his ablation.   Carl [name] underwent successful ablation of his atrial flutter on January 11, 2019 which was isthmus-dependent right atrial flutter upon presentation to the EP lab.  He was able to be successfully treated and was discharged later that evening in sinus rhythm.  I last saw him on January 16, 2019 after he  called the office the day previously complaining of some shortness of breath.  He again called the office this morning stating that he was more short of breath, very fatigued and having no energy.  As result I added him onto my schedule  for further evaluation.  He denied any chest pain and was unaware of any palpitations; per his apple watch monitoring his resting pulse yesterday was 56 and today 57.    During that evaluation, I recommended that he undergo a chest x-ray, follow-up 2D echo Doppler study, and scheduled him for [place] study to make certain his dyspnea was not ischemia mediated.  His echo Doppler study showed EF at 60 to 65% with mild LVH.  There was abnormal septal motion consistent with his postoperative state.  There was grade 2 diastolic dysfunction.  He had severe biatrial enlargement.  A chest x-ray did not reveal any active disease.  A Lexiscan Myoview study was low risk without ischemia with nuclear stress EF at 60%.  He underwent his sleep study on January 25, 2019.  Apparently several days prior to his sleep study he apparently again ran out of his metoprolol.  Of note during the sleep study his ECG revealed that he was in atrial fibrillation.  He was found to have severe obstructive sleep apnea in a split-night protocol with an AHI of 63.5/h.  CPAP was initiated but due to continued events was transitioned to BiPAP therapy and he was ultimately titrated to 18/14.  AHI at this level was 3.6 with oxygen nadir 93%.    I saw him for follow-up on February 14, 2019.  At that time  he had resumed metoprolol.  His set up date for BiPAP was February 04, 2019.  Over the past week and a half on therapy he has noticed significant improvement in his previous fatigability.  A download was obtained since his set up date and he was 100% compliant since December 28.  He had a mask leak.  At 18/14 BiPAP pressure AHI was improved but still increased at 6.3.  There were no central events.     Since I last saw him, Carl [name] was reevaluated by Dr. Lovena Le on February 19, 2019.  He was doing well and maintaining sinus rhythm.  Apparently was recommended that he reduce his metoprolol to 50 twice a day but apparently this has not been done.  I last saw him on March 18 for his initial initial sleep clinic evaluation.  Of note, a download from January 8 through March 05, 2019 shows that he is meeting compliance standards with 84% of usage days and averaging 6 hours and 20 minutes.  AHI was 7.9 and his BiPAP device was set at a minimum EPAP of 12 with maximum IPAP of 22.  Apparently over the past month, Carl [name] states that he has lost 10 pounds by essentially giving up bread and reducing carbohydrates.  He believes with his weight loss he was not needing CPAP.  He had issues with mask leak and apparently went to get a new mask which was a ResMed air fit F 30i from [facility].  Apparently, he never adjusted the straps to fit his head and as result this resulted in significant leakage and consequently for the last 30 days he has only used CPAP for 5 days and only for 2 hours and 6 minutes on average.  AHI was 9.6 /h.  During that evaluation I had a lengthy discussion with him again stressing the importance of increased incidence of recurrent atrial fibrillation with his severe sleep apnea if left untreated.  He was not wearing his mask appropriately and we spent significant time education and instruction on how to place the mask on to fit well without significant leak.  At that time I reduced his metoprolol down to 75 mg in the morning and 50 mg at night.  Over the past several months he has felt well.  Again there were times where he was not typically compliant with CPAP use but over the past several weeks he has been using it more regularly and has noticed more energy.  Several weeks ago when he was not using it he did experience significant fatigability.  He is unaware of any breakthrough atrial  fibrillation or flutter.  He denies recurrent anginal symptoms.  I obtained a new download of his CPAP therapy from May 25 through July 31, 2019.  This revealed reduced compliance with 19 out of 30 days but over the past several weeks he has been much more consistent with CPAP use and only missed 2 days.  At his minimum EPAP pressure of 10 and maximum pressure of 25 AHI was increased at 11.  His 95th percentile pressure was 17.1/13.1 with a maximum average pressure 18.1/14.1.  He presents for reevaluation.  Past Medical History:  Diagnosis Date  . Anxiety    takes Xanax daily as needed  . Arthritis   . Cataract    removed both eyes  . Depression    takes Celexa daily  . Diverticulitis   . Diverticulosis   . Enlarged prostate    sightly  . Esophagitis   .  Gastric ulcer   . GERD (gastroesophageal reflux disease)    takes Protonix daily  . Hx of adenomatous colonic polyps    benign  . Hyperlipidemia    takes Zocor daily  . Hypertension   . Insomnia    takes Ambien nightly  . Joint pain   . Joint swelling     Past Surgical History:  Procedure Laterality Date  . A-FLUTTER ABLATION N/A 01/11/2019   Procedure: A-FLUTTER ABLATION;  Surgeon: Evans Lance, MD;  Location: [facility];  Service: Cardiovascular;  Laterality: N/A;  . CARDIAC CATHETERIZATION    . CARDIOVERSION N/A 11/20/2018   Procedure: CARDIOVERSION;  Surgeon: Troy Sine, MD;  Location: [facility];  Service: Cardiovascular;  Laterality: N/A;  . cartliage removed from nose  40yr ago  . cataract surgery Bilateral   . CERVICAL SPINE SURGERY    . COLONOSCOPY    . CORONARY ARTERY BYPASS GRAFT N/A 11/16/2017   Procedure: CORONARY ARTERY BYPASS GRAFTING (CABG) x4. LEFT ENDOSCOPIC SAPHENOUS VEIN HARVEST AND MAMMARY ARTERY TAKE DOWN. LIMA TO LAD, SVG TO PDA, SVG TO DISTAL CIRC & OMI.;  Surgeon: GGrace Isaac MD;  [facility]  Service: Open Heart Surgery;  Laterality: N/A;  . FOREIGN BODY REMOVAL  ESOPHAGEAL    . KNEE SURGERY Left    couple of times  . LEFT HEART CATH AND CORONARY ANGIOGRAPHY N/A 11/15/2017   Procedure: LEFT HEART CATH AND CORONARY ANGIOGRAPHY;  Surgeon: KTroy Sine MD;  [facility];  Service: Cardiovascular;  Laterality: N/A;  . NASAL SINUS SURGERY    . POLYPECTOMY    . TEE WITHOUT CARDIOVERSION N/A 11/16/2017   Procedure: TRANSESOPHAGEAL ECHOCARDIOGRAM (TEE);  Surgeon: GGrace Isaac MD;  [facility]  Service: Open Heart Surgery;  Laterality: N/A;  . TONSILLECTOMY    . TOTAL KNEE ARTHROPLASTY Left 06/17/2014   Procedure: TOTAL KNEE ARTHROPLASTY;  Surgeon: PGarald Balding MD;  [facility]  Service: Orthopedics;  Laterality: Left;    Current Medications: Outpatient Medications Prior to Visit  Medication Sig Dispense Refill  . acetaminophen (TYLENOL) 500 MG tablet Take 1,000 mg by mouth every 6 (six) hours as needed for moderate pain or headache.    .Marland Kitchenacyclovir (ZOVIRAX) 400 MG tablet Take 400 mg by mouth 2 (two) times daily.    .Marland Kitchenaspirin EC 81 MG tablet Take 1 tablet (81 mg total) by mouth daily. 90 tablet 3  . citalopram (CELEXA) 20 MG tablet Take 20 mg by mouth daily.    .Marland KitchenELIQUIS 5 MG TABS tablet TAKE 1 TABLET(5 MG) BY MOUTH TWICE DAILY 180 tablet 1  . ferrous sulfate 325 (65 FE) MG tablet Take 325 mg by mouth daily.    . Glucosamine HCl (GLUCOSAMINE PO) Take 1 tablet by mouth 2 (two) times daily.    .Marland Kitchenlevothyroxine (SYNTHROID) 150 MCG tablet TAKE 1 TABLET(150 MCG) BY MOUTH DAILY 90 tablet 1  . Multiple Vitamins-Minerals (PRESERVISION AREDS 2 PO) Take 1 tablet by mouth 2 (two) times daily.    . pantoprazole (PROTONIX) 20 MG tablet TAKE 2 TABLETS(40 MG) BY MOUTH DAILY 180 tablet 1  . rosuvastatin (CRESTOR) 20 MG tablet Take 1 tablet (20 mg total) by mouth daily. 90 tablet 2  . sildenafil (REVATIO) 20 MG tablet Take 60 mg by mouth daily as needed for erectile dysfunction.    . traMADol (ULTRAM) 50 MG tablet 1-2 tablets PO BID  40 tablet 0  . VASCEPA  1 g capsule TAKE 2 CAPSULES(2 GRAMS) BY MOUTH TWICE DAILY 360 capsule 1  . zolpidem (AMBIEN) 10 MG tablet Take 10 mg by mouth at bedtime.     . metoprolol tartrate (LOPRESSOR) 50 MG tablet TAKE 75MG (1 AND 1/2 TAB) IN THE MORNING AND 50MG (1 TAB) AT NIGHT 180 tablet 3  . hydrochlorothiazide (MICROZIDE) 12.5 MG capsule Take 1 capsule (12.5 mg total) by mouth as needed (swelling). 30 capsule 3  . ciprofloxacin (CIPRO) 500 MG tablet Take 1 tablet (500 mg total) by mouth 2 (two) times daily. 20 tablet 0  . ciprofloxacin (CIPRO) 500 MG tablet Take 1 tablet (500 mg total) by mouth 2 (two) times daily. 20 tablet 0   No facility-administered medications prior to visit.     Allergies:   Patient has no known allergies.   Social History   Socioeconomic History  . Marital status: Single    Spouse name: Not on file  . Number of children: 3  . Years of education: Not on file  . Highest education level: Not on file  Occupational History  . Occupation: Owner  Tobacco Use  . Smoking status: Never Smoker  . Smokeless tobacco: Never Used  Vaping Use  . Vaping Use: Never used  Substance and Sexual Activity  . Alcohol use: Yes    Alcohol/week: 10.0 standard drinks    Types: 10 Standard drinks or equivalent per week    Comment: 2 drinks 5 nights per week  . Drug use: No  . Sexual activity: Never  Other Topics Concern  . Not on file  Social History Narrative  . Not on file   Social Determinants of Health   Financial Resource Strain:   . Difficulty of Paying Living Expenses:   Food Insecurity:   . Worried About Charity fundraiser in the Last Year:   . Arboriculturist in the Last Year:   Transportation Needs:   . Film/video editor (Medical):   Marland Kitchen Lack of Transportation (Non-Medical):   Physical Activity:   . Days of Exercise per Week:   . Minutes of Exercise per Session:   Stress:   . Feeling of Stress :   Social Connections:   . Frequency of Communication  with Friends and Family:   . Frequency of Social Gatherings with Friends and Family:   . Attends Religious Services:   . Active Member of Clubs or Organizations:   . Attends Archivist Meetings:   Marland Kitchen Marital Status:     Additional social history is notable in that he is divorced x2.  He has 3 children and his oldest son had lived in [place], and currently is back in [place] searching for new employment.  His second son currently had lived [place] and was married in [place] and [place] he moved back to the [place] area.  His daughter works in [place] as a Government social research officer in a Web designer.  There is no tobacco use.  He drinks alcohol.  Family History:  The patient's family history includes Colon cancer in his paternal grandfather.  Mother died at age 74 with emphysema.  His father died at 73.  He has 2 sisters ages 36 and 4.  ROS General: Negative; No fevers, chills, or night sweats; positive for fatigue HEENT: Decreased hearing with hearing aid in right ear, no sinus congestion, difficulty swallowing Pulmonary: Negative; No cough, wheezing, shortness of breath, hemoptysis Cardiovascular: See HPI GI: History of diverticular  disease and colonic polyps. GU: Remote history of genital herpes; erectile dysfunction Musculoskeletal: Negative; no myalgias, joint pain, or weakness Hematologic/Oncology: Negative; no easy bruising, bleeding Endocrine: Positive for hypothyroidism Neuro: Negative; no changes in balance, headaches Skin: Negative; No rashes or skin lesions Psychiatric:  Sleep: Positive snoring snoring, previous fatigue; documented to have severe OSA now on BiPAP therapy; no bruxism, restless legs, hypnogognic hallucinations, no cataplexy Other comprehensive 14 point system review is negative.   PHYSICAL EXAM:   VS:  BP 110/73   Pulse (!) 53   Temp 98.1 F (36.7 C)   Ht 5' 9"  (1.753 m)   Wt 203 lb 9.6 oz (92.4 kg)   SpO2 95%   BMI 30.07 kg/m      Repeat blood pressure 110/73 , Wt Readings from Last 3 Encounters:  08/01/19 203 lb 9.6 oz (92.4 kg)  05/23/19 195 lb (88.5 kg)  04/25/19 195 lb (88.5 kg)    General: Alert, oriented, no distress.  Skin: normal turgor, no rashes, warm and dry HEENT: Normocephalic, atraumatic. Pupils equal round and reactive to light; sclera anicteric; extraocular muscles intact;  Nose without nasal septal hypertrophy Mouth/Parynx benign; Mallinpatti scale 3 Neck: No JVD, no carotid bruits; normal carotid upstroke Lungs: clear to ausculatation and percussion; no wheezing or rales Chest wall: without tenderness to palpitation Heart: PMI not displaced, RRR, s1 s2 normal, 1/6 systolic murmur, no diastolic murmur, no rubs, gallops, thrills, or heaves Abdomen: soft, nontender; no hepatosplenomehaly, BS+; abdominal aorta nontender and not dilated by palpation. Back: no CVA tenderness Pulses 2+ Musculoskeletal: full range of motion, normal strength, no joint deformities Extremities: no clubbing cyanosis or edema, Homan's sign negative  Neurologic: grossly nonfocal; Cranial nerves grossly wnl Psychologic: Normal mood and affect   Studies/Labs Reviewed:   ECG (independently read by me):Sinus bradycardia at 53; QTc 437 msec  March 18, 2021ECG (independently read by me):Sinus bradycardia at 48; QTc 441 msec; No ST changes; no ectopy  January 7, 2021ECG (independently read by me): Sinus Bradycardia at 51; no STT changes; QTc 442 msec; no ectopy  January 16, 2019 ECG (independently read by me): Sinus bradycardia at 57 bpm with possible left atrial enlargement.  PR interval 176 ms, QTc interval 476 ms.  No ST segment changes.  January 02, 2019 ECG (independently read by me): Atrial flutter with 2-1 AV conduction with a ventricular rate of 130 bpm.  December 21, 2018 ECG (independently read by me): Atrial flutter with variable block at 115, QT 402,QTC 556 msec  Cardioversion November 20, 2018 with  restoration of sinus rhythm.  November 15, 2018 ECG (independently read by me): Atrial Flutter with variable block at 97 bpm  September 2020 ECG (independently read by me): Atrial flutter with variable block with an average heart rate at 115 bpm  October 29, 2018 ECG (independently read by me): Atrial flutter with variable block 130 bpm.  No specific ST-T changes  October 08, 2018 ECG (independently read by me): NSR at 60; isolated PVC; QTc 434 msec.  April 02, 2018 ECG (independently read by me): NSR 62; Nonspecific T change; QTc 452 msec  December 29, 2017 ECG (independently read by me): Sinus bradycardia at 49 bpm.  Nonspecific ST changes.  QTc interval 464 ms.  No ectopy.  ECG (independently read by me): Sinus rhythm at 64 bpm.  Right bundle branch block with repolarization changes.  No ectopy.  October 24, 2017 ECG (independently read by me): Normal sinus rhythm at 62 bpm.  Right  bundle branch block with repolarization changes.   ECG  April 18, 2014 revealed normal sinus rhythm.  Right bundle branch block was not present.  Recent Labs: BMP Latest Ref Rng & Units 01/16/2019 01/10/2019 11/16/2018  Glucose 65 - 99 mg/dL 79 116(H) 103(H)  BUN 8 - 27 mg/dL 18 14 14   Creatinine 0.76 - 1.27 mg/dL 0.75(L) 0.90 0.87  BUN/Creat Ratio 10 - 24 24 16 16   Sodium 134 - 144 mmol/L 139 138 138  Potassium 3.5 - 5.2 mmol/L 4.4 4.8 4.8  Chloride 96 - 106 mmol/L 102 101 100  CO2 20 - 29 mmol/L 21 21 22   Calcium 8.6 - 10.2 mg/dL 8.7 9.4 9.2     Hepatic Function Latest Ref Rng & Units 11/16/2018 10/05/2018 04/03/2018  Total Protein 6.0 - 8.5 g/dL 6.6 6.7 6.8  Albumin 3.7 - 4.7 g/dL 4.1 4.6 4.3  AST 0 - 40 IU/L 37 46(H) 32  ALT 0 - 44 IU/L 41 49(H) 32  Alk Phosphatase 39 - 117 IU/L 124(H) 91 183(H)  Total Bilirubin 0.0 - 1.2 mg/dL 0.4 0.4 0.4  Bilirubin, Direct 0.00 - 0.40 mg/dL - - -    CBC Latest Ref Rng & Units 01/16/2019 01/10/2019 11/16/2018  WBC 3.4 - 10.8 x10E3/uL 6.4 7.7 7.8  Hemoglobin  13.0 - 17.7 g/dL 14.3 15.4 14.0  Hematocrit 37.5 - 51.0 % 41.9 45.2 40.2  Platelets 150 - 450 x10E3/uL 159 204 211   Lab Results  Component Value Date   MCV 95 01/16/2019   MCV 95 01/10/2019   MCV 96 11/16/2018   Lab Results  Component Value Date   TSH 4.050 01/16/2019   Lab Results  Component Value Date   HGBA1C 5.4 11/15/2017     BNP    Component Value Date/Time   BNP 131.4 (H) 10/05/2018 1010    ProBNP No results found for: PROBNP   Lipid Panel     Component Value Date/Time   CHOL 143 10/05/2018 1010   TRIG 142 10/05/2018 1010   HDL 48 10/05/2018 1010   CHOLHDL 3.0 10/05/2018 1010   CHOLHDL 3.4 01/29/2007 0410   VLDL 17 01/29/2007 0410   LDLCALC 67 10/05/2018 1010     RADIOLOGY: No results found.   Additional studies/ records that were reviewed today include:  I reviewed his prior office visits and most recent DC cardioversion.  CONCLUSIONS:  1. Isthmus-dependent right atrial flutter upon presentation.  2. Successful radiofrequency ablation of atrial flutter along the cavotricuspid isthmus with complete bidirectional isthmus block achieved.  3. No inducible arrhythmias following ablation.  4. No early apparent complications.   Carl Peru, MD  12:37 PM 01/11/2019   SPLIT NIGHT SLEEP STUDY: 01/25/2019 SLEEP STUDY TECHNIQUE As per the AASM Manual for the Scoring of Sleep and Associated Events v2.3 (April 2016) with a hypopnea requiring 4% desaturations.  The channels recorded and monitored were frontal, central and occipital EEG, electrooculogram (EOG), submentalis EMG (chin), nasal and oral airflow, thoracic and abdominal wall motion, anterior tibialis EMG, snore microphone, electrocardiogram, and pulse oximetry. Bi-level positive airway pressure (BiPAP) was initiated when the patient met split night criteria and was titrated according to treat sleep-disordered breathing.  RESPIRATORY PARAMETERS Diagnostic Total AHI (/hr):            63.5      RDI (/hr):         65.8     OA Index (/hr):            17.7  CA Index (/hr):      5.9 REM AHI (/hr):            N/A      NREM AHI (/hr):          63.5     Supine AHI (/hr):         63.5     Non-supine AHI (/hr):        N/A Min O2 Sat (%):          81.0     Mean O2 (%):  93.0     Time below 88% (min):           18.7       Titration Optimal IPAP Pressure (cm): 18        Optimal EPAP Pressure (cm):            14        AHI at Optimal Pressure (/hr):          3.6       Min O2 at Optimal Pressure (%):       93.0 Sleep % at Optimal (%):         97        Supine % at Optimal (%):       7            SLEEP ARCHITECTURE The study was initiated at 10:10:34 PM and terminated at 4:34:09 AM. The total recorded time was 383.6 minutes. EEG confirmed total sleep time was 333.3 minutes yielding a sleep efficiency of 86.9%%. Sleep onset after lights out was 0.8 minutes with a REM latency of 195.0 minutes. The patient spent 1.5%% of the night in stage N1 sleep, 80.9%% in stage N2 sleep, 0.0%% in stage N3 and 17.6% in REM. Wake after sleep onset (WASO) was 49.5 minutes. The Arousal Index was 32.0/hour.  LEG MOVEMENT DATA The total Periodic Limb Movements of Sleep (PLMS) were 0. The PLMS index was 0.0 .  CARDIAC DATA The 2 lead EKG demonstrated sinus rhythm. The mean heart rate was 100.0 beats per minute. Other EKG findings include: None.  IMPRESSIONS - Severe obstructive sleep apnea occurred during the diagnostic portion of the study (AHI 63.5 /h; RDI 65.8/h). CPAP was initiated at 5 cm and was titrated to 9 cm . Due to frequent central events, BiPAP was implemented at 11/7 and was titrated to 18/14 cm of water: AHI 3.6/h, O2 nadir 93%. 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 - Moderate oxygen desaturation was noted during the diagnostic portion of the study (Min O2 = 81.0%). - The patient snored with loud snoring volume during the diagnostic portion  of the study. - It appears that the patient was in atrial fibrillation throughtout the study. - Clinically significant periodic limb movements of sleep did not occur during the study.  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 A Small size Fisher[employer]Paykel Full Face Mask Simplus mask was used for the titration. If patient continues to experience central events, ASV titration may be necessary. - Effort should be made to optimize nasal and oropharyngeal patency. - Avoid alcohol, sedatives and other CNS depressants that may worsen sleep apnea and disrupt normal sleep architecture. - Sleep hygiene should be reviewed to assess factors that may improve sleep  quality. - Weight management and regular exercise should be initiated or continued. - Recoomend f/u cardiology evalution for probable recurrent atrial fibrillation.  - Recommend a download in 30 days and sleep clinic evaluation after 30 days of therapy.  [Electronically signed] 01/27/2019 04:13 PM  Shelva Majestic MD, [facility], [place], American Board of Sleep Medicine   ASSESSMENT:    1. Anticoagulated   2. CAD in native artery   3. S/P CABG x 4   4. OSA (obstructive sleep apnea)   5. S/P ablation of atrial flutter   6. PAF (paroxysmal atrial fibrillation) ([place])   7. Hyperlipidemia LDL goal <70   8. Hypothyroidism, unspecified type    PLAN:  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  He has a history of mild hyperlipidemia and remotely had been on simvastatin 40 mg daily and has been followed by Dr. Hulan Fess.  In the fall 2019 he began to notice mild shortness of breath particularly with walking up steps.  An echo Doppler study revealed hyperdynamic LV function with an EF of 65 to 70% but with grade  2 diastolic dysfunction and abnormal tissue Doppler.  A CT coronary angiogram was suggestive of significant multivessel CAD and was FFR positive.  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 Servando Snare.  His postoperative course was complicated by paroxysmal atrial fibrillation for which he was started on amiodarone and ultimately was discharged on Eliquis.  When I saw him for initial post hospital evaluation he was still on amiodarone 200 mg twice a day, metoprolol tartrate 25 mg twice a day.  His ECG showed sinus bradycardia 5 weeks status post CABG revascularization; amiodarone was decreased to 200 mg daily and subsequently discontinued when subsequent laboratory showed LFT elevation and further increase in his TSH level.  With his hypothyroidism he required increasing doses of levothyroxine, ultimately titrated to his present dose of 150 mcg daily.  He had developed episodes of atrial flutter and underwent initial cardioversion in October 2020.  Due to recurrent atrial flutter he was ultimately referred to Dr. Lovena Le and underwent successful a flutter ablation on January 12, 2019.  Due to concerns for obstructive sleep apnea he ultimately was able to have his sleep study which confirmed my suspicion.  He was found to have severe sleep apnea with an AHI of over 60 times per hour with significant oxygen desaturation to a nadir of 80%.  When I saw him for sleep and follow-up of his ablation in early January,  he has been on BiPAP therapy for 9 days and felt marked improvement in his previous fatigability and energy.   Of note he had run out of his metoprolol 2 days prior to his sleep study and he was back in atrial fibrillation during his sleep evaluation.  His ECG at his January visit on metoprolol 75 mg twice a day revealed  sinus rhythm with asymptomatic sinus bradycardia 51 bpm.  For his  initial month of therapy, [place] has met compliance standards for his BiPAP.  At his last evaluation in March he was not compliant and I spent considerable time with both he and his significant other, Jocelyn Lamer discussing the importance of optimal treatment.  At that time he was having significant mask leak and was placing his mask on incorrectly.  His most recent  download from May 25 through June 23 continues to show suboptimal usage but over the past 2 weeks he has been using it much more regularly and over this time.  Has noticed significant benefit with reference to his previous fatigability.  He states he is in need for a new tubing as well as new mask.  We will place an order for these to be updated.  I will slightly increase his BiPAP minimum pressure to 11 cm and will increase his pressure support to 5 in light of his increased AHI at 11.0.He is bradycardic on exam today with resting pulse of 53.  I have recommended further reduction of metoprolol down to 50 mg twice a day.  He is not having any anginal symptomatology.  He has not had any recurrent atrial fibrillation or atrial flutter following his a flutter ablation.  He continues to be on Eliquis for anticoagulation.    He continues to be on rosuvastatin 20 mg and Vascepa for hyperlipidemia.  He is on levothyroxine at 150 mcg for hypothyroidism. I will see him in 6 months for reevaluation or sooner as needed.     Medication Adjustments/Labs and Tests Ordered: Current medicines are reviewed at length with the patient today.  Concerns regarding medicines are outlined above.  Medication changes, Labs and Tests ordered today are listed in the Patient Instructions below. Patient Instructions  Medication Instructions:  CHANGE THE WAY YOU TAKE YOUR METOPROLOL TARTRATE- 50MG TWICE A DAY (1 TAB TWICE A DAY)  PRESCRIPTION FOR NITROGLYCERINE SENT WITH PHARMACY- DO NOT TAKE WITH YOUR SILDENAFIL  *If you need a refill on your cardiac medications before your next  appointment, please call your pharmacy*   Follow-Up: At [facility], you and your health needs are our priority.  As part of our continuing mission to provide you with exceptional heart care, we have created designated Provider Care Teams.  These Care Teams include your primary Cardiologist (physician) and Advanced Practice Providers (APPs -  Physician Assistants and Nurse Practitioners) who all work together to provide you with the care you need, when you need it.  We recommend signing up for the patient portal called "MyChart".  Sign up information is provided on this After Visit Summary.  MyChart is used to connect with patients for Virtual Visits (Telemedicine).  Patients are able to view lab/test results, encounter notes, upcoming appointments, etc.  Non-urgent messages can be sent to your provider as well.   To learn more about what you can do with MyChart, go to [email or web address].    Your next appointment:   6 month(s)  The format for your next appointment:   In Person  Provider:   Shelva Majestic, MD        Signed, Shelva Majestic, MD  08/02/2019 5:40 PM    [facility] [address], [place], [place], [place]  [address] Phone: [phone]

## 2019-08-01 NOTE — Patient Instructions (Signed)
Medication Instructions:  CHANGE THE WAY YOU TAKE YOUR METOPROLOL TARTRATE- 50MG  TWICE A DAY (1 TAB TWICE A DAY)  PRESCRIPTION FOR NITROGLYCERINE SENT WITH PHARMACY- DO NOT TAKE WITH YOUR SILDENAFIL  *If you need a refill on your cardiac medications before your next appointment, please call your pharmacy*   Follow-Up: At [facility], you and your health needs are our priority.  As part of our continuing mission to provide you with exceptional heart care, we have created designated Provider Care Teams.  These Care Teams include your primary Cardiologist (physician) and Advanced Practice Providers (APPs -  Physician Assistants and Nurse Practitioners) who all work together to provide you with the care you need, when you need it.  We recommend signing up for the patient portal called "MyChart".  Sign up information is provided on this After Visit Summary.  MyChart is used to connect with patients for Virtual Visits (Telemedicine).  Patients are able to view lab/test results, encounter notes, upcoming appointments, etc.  Non-urgent messages can be sent to your provider as well.   To learn more about what you can do with MyChart, go to [email or web address].    Your next appointment:   6 month(s)  The format for your next appointment:   In Person  Provider:   Shelva Majestic, MD

## 2019-08-02 ENCOUNTER — Encounter: Payer: Self-pay | Admitting: Cardiovascular Disease

## 2019-08-02 ENCOUNTER — Telehealth: Payer: Self-pay | Admitting: *Deleted

## 2019-08-02 DIAGNOSIS — G4733 Obstructive sleep apnea (adult) (pediatric): Secondary | ICD-10-CM | POA: Diagnosis not present

## 2019-08-02 NOTE — Telephone Encounter (Signed)
Spoke with Ivin Booty @ [facility]. She will contact patient about getting new supplies. She has a face mask order still on file.

## 2019-08-05 DIAGNOSIS — G4733 Obstructive sleep apnea (adult) (pediatric): Secondary | ICD-10-CM | POA: Diagnosis not present

## 2019-08-29 DIAGNOSIS — H353132 Nonexudative age-related macular degeneration, bilateral, intermediate dry stage: Secondary | ICD-10-CM | POA: Diagnosis not present

## 2019-08-29 DIAGNOSIS — T8579XS Infection and inflammatory reaction due to other internal prosthetic devices, implants and grafts, sequela: Secondary | ICD-10-CM | POA: Diagnosis not present

## 2019-09-04 DIAGNOSIS — G4733 Obstructive sleep apnea (adult) (pediatric): Secondary | ICD-10-CM | POA: Diagnosis not present

## 2019-09-17 DIAGNOSIS — M79641 Pain in right hand: Secondary | ICD-10-CM | POA: Diagnosis not present

## 2019-10-01 DIAGNOSIS — M79641 Pain in right hand: Secondary | ICD-10-CM | POA: Diagnosis not present

## 2019-10-02 DIAGNOSIS — H02834 Dermatochalasis of left upper eyelid: Secondary | ICD-10-CM | POA: Diagnosis not present

## 2019-10-02 DIAGNOSIS — H02831 Dermatochalasis of right upper eyelid: Secondary | ICD-10-CM | POA: Diagnosis not present

## 2019-10-02 DIAGNOSIS — H57813 Brow ptosis, bilateral: Secondary | ICD-10-CM | POA: Diagnosis not present

## 2019-10-03 ENCOUNTER — Other Ambulatory Visit: Payer: Self-pay

## 2019-10-03 ENCOUNTER — Telehealth: Payer: Self-pay | Admitting: Physician Assistant

## 2019-10-03 MED ORDER — CIPROFLOXACIN HCL 500 MG PO TABS: 500.0000 mg | ORAL_TABLET | Freq: Two times a day (BID) | ORAL | 0 refills | Status: AC

## 2019-10-03 NOTE — Telephone Encounter (Signed)
Confirmed pharmacy. Sent Rx. Patient notified.

## 2019-10-03 NOTE — Telephone Encounter (Signed)
Yes, please call in refill for Cipro 500 mg p.o. twice daily x10 days.  Please call the patient and let him know to contact us if his symptoms have not improved in the next 4 to 5 days or if his symptoms worsen at any point, thank you

## 2019-10-03 NOTE — Telephone Encounter (Signed)
Do I need to send in Cipro for him?

## 2019-10-03 NOTE — Telephone Encounter (Signed)
Checking on status of message he left earlier. Tells me if he doesn't get the medications today, he is certain he will end up in the hospital.

## 2019-10-03 NOTE — Telephone Encounter (Signed)
Pt is requesting a medication refill for his diverticulitis.

## 2019-10-05 DIAGNOSIS — G4733 Obstructive sleep apnea (adult) (pediatric): Secondary | ICD-10-CM | POA: Diagnosis not present

## 2019-10-08 DIAGNOSIS — M79641 Pain in right hand: Secondary | ICD-10-CM | POA: Diagnosis not present

## 2019-10-10 DIAGNOSIS — H6123 Impacted cerumen, bilateral: Secondary | ICD-10-CM | POA: Diagnosis not present

## 2019-10-23 DIAGNOSIS — E782 Mixed hyperlipidemia: Secondary | ICD-10-CM | POA: Diagnosis not present

## 2019-10-23 DIAGNOSIS — I480 Paroxysmal atrial fibrillation: Secondary | ICD-10-CM | POA: Diagnosis not present

## 2019-10-23 DIAGNOSIS — H409 Unspecified glaucoma: Secondary | ICD-10-CM | POA: Diagnosis not present

## 2019-10-23 DIAGNOSIS — Z8673 Personal history of transient ischemic attack (TIA), and cerebral infarction without residual deficits: Secondary | ICD-10-CM | POA: Diagnosis not present

## 2019-10-23 DIAGNOSIS — F419 Anxiety disorder, unspecified: Secondary | ICD-10-CM | POA: Diagnosis not present

## 2019-10-23 DIAGNOSIS — L570 Actinic keratosis: Secondary | ICD-10-CM | POA: Diagnosis not present

## 2019-10-23 DIAGNOSIS — R3129 Other microscopic hematuria: Secondary | ICD-10-CM | POA: Diagnosis not present

## 2019-10-23 DIAGNOSIS — R7301 Impaired fasting glucose: Secondary | ICD-10-CM | POA: Diagnosis not present

## 2019-10-23 DIAGNOSIS — I10 Essential (primary) hypertension: Secondary | ICD-10-CM | POA: Diagnosis not present

## 2019-10-23 DIAGNOSIS — Z125 Encounter for screening for malignant neoplasm of prostate: Secondary | ICD-10-CM | POA: Diagnosis not present

## 2019-10-23 DIAGNOSIS — G4700 Insomnia, unspecified: Secondary | ICD-10-CM | POA: Diagnosis not present

## 2019-10-23 DIAGNOSIS — Z1159 Encounter for screening for other viral diseases: Secondary | ICD-10-CM | POA: Diagnosis not present

## 2019-10-23 DIAGNOSIS — Z0000 Encounter for general adult medical examination without abnormal findings: Secondary | ICD-10-CM | POA: Diagnosis not present

## 2019-10-23 DIAGNOSIS — E032 Hypothyroidism due to medicaments and other exogenous substances: Secondary | ICD-10-CM | POA: Diagnosis not present

## 2019-10-23 DIAGNOSIS — I2510 Atherosclerotic heart disease of native coronary artery without angina pectoris: Secondary | ICD-10-CM | POA: Diagnosis not present

## 2019-10-24 ENCOUNTER — Other Ambulatory Visit: Payer: Self-pay | Admitting: Cardiovascular Disease

## 2019-10-24 DIAGNOSIS — M79641 Pain in right hand: Secondary | ICD-10-CM | POA: Diagnosis not present

## 2019-10-25 NOTE — Telephone Encounter (Signed)
Prescription refill request for Eliquis received. Indication:  Atrial Fibrillation Last office visit: 08/01/19  Claiborne Billings Scr: 0.75  01/2019 Age: 74 Weight: 92.4 kg  Prescription refilled

## 2019-10-31 ENCOUNTER — Other Ambulatory Visit: Payer: Self-pay | Admitting: Cardiovascular Disease

## 2019-11-05 DIAGNOSIS — G4733 Obstructive sleep apnea (adult) (pediatric): Secondary | ICD-10-CM | POA: Diagnosis not present

## 2019-11-10 ENCOUNTER — Other Ambulatory Visit: Payer: Self-pay | Admitting: Cardiovascular Disease

## 2019-12-03 ENCOUNTER — Encounter (INDEPENDENT_AMBULATORY_CARE_PROVIDER_SITE_OTHER): Payer: PPO | Admitting: Ophthalmology

## 2019-12-05 DIAGNOSIS — G4733 Obstructive sleep apnea (adult) (pediatric): Secondary | ICD-10-CM | POA: Diagnosis not present

## 2019-12-06 ENCOUNTER — Encounter (INDEPENDENT_AMBULATORY_CARE_PROVIDER_SITE_OTHER): Payer: PPO | Admitting: Ophthalmology

## 2019-12-18 ENCOUNTER — Other Ambulatory Visit: Payer: Self-pay | Admitting: Internal Medicine

## 2019-12-19 ENCOUNTER — Other Ambulatory Visit: Payer: Self-pay | Admitting: Cardiovascular Disease

## 2019-12-31 ENCOUNTER — Other Ambulatory Visit: Payer: Self-pay | Admitting: Cardiovascular Disease

## 2019-12-31 ENCOUNTER — Other Ambulatory Visit: Payer: Self-pay

## 2019-12-31 ENCOUNTER — Encounter (INDEPENDENT_AMBULATORY_CARE_PROVIDER_SITE_OTHER): Payer: PPO | Admitting: Ophthalmology

## 2019-12-31 DIAGNOSIS — H43813 Vitreous degeneration, bilateral: Secondary | ICD-10-CM

## 2019-12-31 DIAGNOSIS — H33301 Unspecified retinal break, right eye: Secondary | ICD-10-CM

## 2019-12-31 DIAGNOSIS — H35033 Hypertensive retinopathy, bilateral: Secondary | ICD-10-CM

## 2019-12-31 DIAGNOSIS — H353132 Nonexudative age-related macular degeneration, bilateral, intermediate dry stage: Secondary | ICD-10-CM

## 2019-12-31 DIAGNOSIS — I10 Essential (primary) hypertension: Secondary | ICD-10-CM

## 2020-01-05 DIAGNOSIS — G4733 Obstructive sleep apnea (adult) (pediatric): Secondary | ICD-10-CM | POA: Diagnosis not present

## 2020-02-04 DIAGNOSIS — G4733 Obstructive sleep apnea (adult) (pediatric): Secondary | ICD-10-CM | POA: Diagnosis not present

## 2020-02-10 DIAGNOSIS — N3941 Urge incontinence: Secondary | ICD-10-CM | POA: Diagnosis not present

## 2020-02-10 DIAGNOSIS — R3121 Asymptomatic microscopic hematuria: Secondary | ICD-10-CM | POA: Diagnosis not present

## 2020-02-10 DIAGNOSIS — N5201 Erectile dysfunction due to arterial insufficiency: Secondary | ICD-10-CM | POA: Diagnosis not present

## 2020-02-10 DIAGNOSIS — N401 Enlarged prostate with lower urinary tract symptoms: Secondary | ICD-10-CM | POA: Diagnosis not present

## 2020-02-11 ENCOUNTER — Ambulatory Visit (INDEPENDENT_AMBULATORY_CARE_PROVIDER_SITE_OTHER): Payer: PPO

## 2020-02-11 ENCOUNTER — Other Ambulatory Visit: Payer: Self-pay

## 2020-02-11 ENCOUNTER — Encounter: Payer: Self-pay | Admitting: Orthopaedic Surgery

## 2020-02-11 ENCOUNTER — Ambulatory Visit: Payer: PPO | Admitting: Orthopaedic Surgery

## 2020-02-11 VITALS — Ht 68.0 in | Wt 194.0 lb

## 2020-02-11 DIAGNOSIS — M545 Low back pain, unspecified: Secondary | ICD-10-CM

## 2020-02-11 DIAGNOSIS — M25551 Pain in right hip: Secondary | ICD-10-CM

## 2020-02-11 MED ORDER — METHYLPREDNISOLONE 4 MG PO TBPK: ORAL_TABLET | ORAL | 0 refills | Status: DC

## 2020-02-11 NOTE — Progress Notes (Signed)
Office Visit Note   Patient: Carl [name]           Date of Birth: 07/22/1945           MRN: [id number] Visit Date: 02/11/2020              Requested by: Hulan Fess, MD [place],  [place] [address] PCP: Hulan Fess, MD   Assessment & Plan: Visit Diagnoses:  1. Pain in right hip   2. Acute right-sided low back pain without sciatica     Plan: Experienced rather acute onset of right groin and lateral hip pain over the last several days.  He thinks it may have been related to him either exercising or carrying some heavy loads.  Has had difficult time bearing weight.  Does not have any trouble sleeping.  He has a little bit of back pain when he first wakes up in the morning but it seems to resolve fairly quickly.  Is not had true sciatica.  X-rays of his lumbar spine revealed diffuse degenerative changes but no acute changes.  He had very minimal decrease in the superior joint space of the right hip.  Based on his clinical exam it appears that the problem is related to his hip.  He did not have some tenderness over the greater trochanter and could easily have a muscular etiology.  I would like to place him on Medrol and but will check with his cardiologist and ask him to check back with me within a week if not any better  Follow-Up Instructions: Return if symptoms worsen or fail to improve.   Orders:  Orders Placed This Encounter  Procedures  . XR Lumbar Spine 2-3 Views   No orders of the defined types were placed in this encounter.     Procedures: No procedures performed   Clinical Data: No additional findings.   Subjective: Chief Complaint  Patient presents with  . Right Hip - Pain  Patient presents today for right hip pain. He said that his hip started to hurt while riding his exercise bike on 02/02/2020. His pain is located at his lateral hip and travels down his leg some. No numbness or tingling. He does have some lower back pain. He states that walking  makes his pain worse. He does feel better in the mornings. He has been taking Tylenol or Liquor for pain.  Has minimal low back pain in the morning but no referred discomfort distally in his right leg.  No left leg symptoms.  Pain is in the groin and over the lateral aspect of the hip.  No bowel or bladder changes  HPI  Review of Systems   Objective: Vital Signs: Ht 5\' 8"  (1.727 m)   Wt 194 lb (88 kg)   BMI 29.50 kg/m   Physical Exam Constitutional:      Appearance: He is well-developed and well-nourished.  HENT:     Mouth/Throat:     Mouth: Oropharynx is clear and moist.  Eyes:     Extraocular Movements: EOM normal.     Pupils: Pupils are equal, round, and reactive to light.  Pulmonary:     Effort: Pulmonary effort is normal.  Skin:    General: Skin is warm and dry.  Neurological:     Mental Status: He is alert and oriented to person, place, and time.  Psychiatric:        Mood and Affect: Mood and affect normal.  Behavior: Behavior normal.     Ortho Exam awake alert and oriented x3.  Comfortable sitting does have some pain in the right groin with external rotation but not with internal rotation.  Also has some areas of tenderness over the sartorius insertion.  No skin changes.  No pain directly over the greater trochanter.  Motor exam intact.  No percussible tenderness of lumbar spine.  Straight leg raise negative  Specialty Comments:  No specialty comments available.  Imaging: XR Lumbar Spine 2-3 Views  Result Date: 02/11/2020 Films lumbar spine were obtained in several projections.  There is diffuse degenerative disc disease particularly at L3-4, L4-5 and L5-S1.  There is very slight anterior listhesis of L4 on 5.  Diffuse facet sclerosis at the lower 3 levels.  No scoliosis.  No evidence of acute change.  Limited views of the hips reveal very minimal narrowing of the superior joint space right hip but no evidence of AVN or ectopic calcification.  No obvious  fracture    PMFS History: Patient Active Problem List   Diagnosis Date Noted  . Pain in right hip 02/11/2020  . Low back pain 02/11/2020  . Typical atrial flutter (HCC)   . TSH elevation 11/21/2017  . Atrial fibrillation with rapid ventricular response (HCC)   . 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 CAD in native artery 11/15/2017  . Abnormal cardiac CT angiography   . RLQ abdominal pain 07/07/2015  . Primary osteoarthritis of left knee 06/17/2014  . 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 Preoperative cardiovascular examination 04/19/2014  . Hyperlipidemia LDL goal <70 04/19/2014  . AP (abdominal pain) 01/16/2014  . History of diverticulitis 01/16/2014  . GERD 01/14/2009  . ULCER-GASTRIC 01/14/2009  . GASTRITIS 11/14/2008  . DIVERTICULITIS OF COLON 11/13/2008  . BLOOD IN STOOL 11/13/2008  . ABDOMINAL PAIN 11/13/2008  . PERSONAL HISTORY OF COLONIC POLYPS 11/13/2008   Past Medical History:  Diagnosis Date  . Anxiety    takes Xanax daily as needed  . Arthritis   . Cataract    removed both eyes  . Depression    takes Celexa daily  . Diverticulitis   . Diverticulosis   . Enlarged prostate    sightly  . Esophagitis   . Gastric ulcer   . GERD (gastroesophageal reflux disease)    takes Protonix daily  . Hx of adenomatous colonic polyps    benign  . Hyperlipidemia    takes Zocor daily  . Hypertension   . Insomnia    takes Ambien nightly  . Joint pain   . Joint swelling     Family History  Problem Relation Age of Onset  . Colon cancer Paternal Grandfather        questionable  . Colon polyps Neg Hx   . Esophageal cancer Neg Hx   . Rectal cancer Neg Hx   . Stomach cancer Neg Hx     Past Surgical History:  Procedure Laterality Date  . A-FLUTTER ABLATION N/A 01/11/2019   Procedure: A-FLUTTER ABLATION;  Surgeon: Marinus Maw, MD;  Location: [facility] INVASIVE CV LAB;  Service: Cardiovascular;  Laterality: N/A;  . CARDIAC CATHETERIZATION    . CARDIOVERSION N/A  11/20/2018   Procedure: CARDIOVERSION;  Surgeon: Lennette Bihari, MD;  Location: [facility] ENDOSCOPY;  Service: Cardiovascular;  Laterality: N/A;  . cartliage removed from nose  56yrs ago  . cataract surgery Bilateral   . CERVICAL SPINE SURGERY    . COLONOSCOPY    .  CORONARY ARTERY BYPASS GRAFT N/A 11/16/2017   Procedure: CORONARY ARTERY BYPASS GRAFTING (CABG) x4. LEFT ENDOSCOPIC SAPHENOUS VEIN HARVEST AND MAMMARY ARTERY TAKE DOWN. LIMA TO LAD, SVG TO PDA, SVG TO DISTAL CIRC & OMI.;  Surgeon: [name], MD;  Location: [place];  Service: Open Heart Surgery;  Laterality: N/A;  . FOREIGN BODY REMOVAL ESOPHAGEAL    . KNEE SURGERY Left    couple of times  . LEFT HEART CATH AND CORONARY ANGIOGRAPHY N/A 11/15/2017   Procedure: LEFT HEART CATH AND CORONARY ANGIOGRAPHY;  Surgeon: Troy Sine, MD;  Location: [place] CV LAB;  Service: Cardiovascular;  Laterality: N/A;  . NASAL SINUS SURGERY    . POLYPECTOMY    . TEE WITHOUT CARDIOVERSION N/A 11/16/2017   Procedure: TRANSESOPHAGEAL ECHOCARDIOGRAM (TEE);  Surgeon: [name], MD;  Location: [place];  Service: Open Heart Surgery;  Laterality: N/A;  . TONSILLECTOMY    . TOTAL KNEE ARTHROPLASTY Left 06/17/2014   Procedure: TOTAL KNEE ARTHROPLASTY;  Surgeon: Garald Balding, MD;  Location: [place];  Service: Orthopedics;  Laterality: Left;   Social History   Occupational History  . Occupation: Owner  Tobacco Use  . Smoking status: Never Smoker  . Smokeless tobacco: Never Used  Vaping Use  . Vaping Use: Never used  Substance and Sexual Activity  . Alcohol use: Yes    Alcohol/week: 10.0 standard drinks    Types: 10 Standard drinks or equivalent per week    Comment: 2 drinks 5 nights per week  . Drug use: No  . Sexual activity: Never

## 2020-02-11 NOTE — Addendum Note (Signed)
Addended by: Wendi Maya on: 02/11/2020 02:28 PM   Modules accepted: Orders

## 2020-02-17 DIAGNOSIS — L570 Actinic keratosis: Secondary | ICD-10-CM | POA: Diagnosis not present

## 2020-03-04 ENCOUNTER — Telehealth: Payer: Self-pay

## 2020-03-04 NOTE — Telephone Encounter (Signed)
Patient called he stated he spoke with Dr.Whitfield yesterday and Dr.Whitfield told him to schedule a appointment for tomorrow . The schedule is full patient would like a call back with a appointment time he stated he is available all day. [phone]

## 2020-03-04 NOTE — Telephone Encounter (Signed)
I left voicemail for patient. Work in appt for [employer] ok per Dr. Durward Fortes. Appt entered for patient. I asked  for return call if this time will not work for him.

## 2020-03-05 ENCOUNTER — Other Ambulatory Visit: Payer: Self-pay

## 2020-03-05 ENCOUNTER — Ambulatory Visit: Payer: PPO | Admitting: Orthopaedic Surgery

## 2020-03-05 ENCOUNTER — Encounter: Payer: Self-pay | Admitting: Orthopaedic Surgery

## 2020-03-05 ENCOUNTER — Ambulatory Visit (INDEPENDENT_AMBULATORY_CARE_PROVIDER_SITE_OTHER): Payer: PPO

## 2020-03-05 DIAGNOSIS — M25551 Pain in right hip: Secondary | ICD-10-CM

## 2020-03-05 MED ORDER — TRAMADOL HCL 50 MG PO TABS: ORAL_TABLET | ORAL | 0 refills | Status: DC

## 2020-03-05 MED ORDER — TRAMADOL HCL 50 MG PO TABS
50.0000 mg | ORAL_TABLET | Freq: Three times a day (TID) | ORAL | 1 refills | Status: DC | PRN
Start: 2020-03-05 — End: 2020-04-08

## 2020-03-05 NOTE — Progress Notes (Signed)
Office Visit Note   Patient: Carl [name]           Date of Birth: 08-24-1945           MRN: [id number] Visit Date: 03/05/2020              Requested by: Hulan Fess, MD [place],  [place] [id number] PCP: Hulan Fess, MD   Assessment & Plan: Visit Diagnoses:  1. Pain in right hip     Plan: [name] was seen last week for evaluation of right lower extremity pain.  I was not sure if it was related to his back or his hip.  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  I placed him on a Medrol Dosepak and he was fine for several days only to have the pain recur once the medicine was finished.  He is using a cane.  The pain is more localized to the right groin with referred pain down the anterior thigh.  On exam he has considerable discomfort with internal and external rotation.  I think the problems referable to his right hip.  Going order an MRI scan, tramadol for pain and also asked Dr. Ernestina Patches to inject his hip plan to check him back in 2 weeks  Follow-Up Instructions: Return in about 2 weeks (around 03/19/2020).   Orders:  No orders of the defined types were placed in this encounter.  No orders of the defined types were placed in this encounter.     Procedures: No procedures performed   Clinical Data: No additional findings.   Subjective: Chief Complaint  Patient presents with  . Right Hip - Pain  . Lower Back - Follow-up  Seen last week for evaluation of right lower extremity pain that was somewhat difficult to localize. presently having more pain in the right groin with referred discomfort along the anterior thigh as far distally as the knee.  No numbness or tingling.  No fever or chills.  No injury or trauma.  Films last week revealed some mild narrowing of the superior joint space right hip  HPI  Review of Systems   Objective: Vital Signs: There were no vitals taken for this visit.  Physical  Exam Constitutional:      Appearance: He is well-developed and well-nourished.  HENT:     Mouth/Throat:     Mouth: Oropharynx is clear and moist.  Eyes:     Extraocular Movements: EOM normal.     Pupils: Pupils are equal, round, and reactive to light.  Pulmonary:     Effort: Pulmonary effort is normal.  Skin:    General: Skin is warm and dry.  Neurological:     Mental Status: He is alert and oriented to person, place, and time.  Psychiatric:        Mood and Affect: Mood and affect normal.        Behavior: Behavior normal.     Ortho Exam Definite limp referable to his right hip.  Using a single-point cane.  Considerable discomfort with internal and external rotation of his right hip with only about 10 degrees in each direction from neutral.  Some discomfort with flexion extension.  Straight leg raise negative.  No percussible spinal pain or lumbar spine specialty Comments:  No specialty comments available.  Imaging: No results found.   PMFS History: Patient Active Problem List   Diagnosis Date Noted  . Pain  in right hip 02/11/2020  . Low back pain 02/11/2020  . Typical atrial flutter ([place])   . TSH elevation 11/21/2017  . Atrial fibrillation with rapid ventricular response ([place])   . 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 CAD in native artery 11/15/2017  . Abnormal cardiac CT angiography   . RLQ abdominal pain 07/07/2015  . Primary osteoarthritis of left knee 06/17/2014  . 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 Preoperative cardiovascular examination 04/19/2014  . Hyperlipidemia LDL goal <70 04/19/2014  . AP (abdominal pain) 01/16/2014  . History of diverticulitis 01/16/2014  . GERD 01/14/2009  . ULCER-GASTRIC 01/14/2009  . GASTRITIS 11/14/2008  . DIVERTICULITIS OF COLON 11/13/2008  . BLOOD IN STOOL 11/13/2008  . ABDOMINAL PAIN 11/13/2008  . PERSONAL HISTORY OF COLONIC POLYPS 11/13/2008   Past Medical History:  Diagnosis Date  . Anxiety    takes Xanax daily  as needed  . Arthritis   . Cataract    removed both eyes  . Depression    takes Celexa daily  . Diverticulitis   . Diverticulosis   . Enlarged prostate    sightly  . Esophagitis   . Gastric ulcer   . GERD (gastroesophageal reflux disease)    takes Protonix daily  . Hx of adenomatous colonic polyps    benign  . Hyperlipidemia    takes Zocor daily  . Hypertension   . Insomnia    takes Ambien nightly  . Joint pain   . Joint swelling     Family History  Problem Relation Age of Onset  . Colon cancer Paternal Grandfather        questionable  . Colon polyps Neg Hx   . Esophageal cancer Neg Hx   . Rectal cancer Neg Hx   . Stomach cancer Neg Hx     Past Surgical History:  Procedure Laterality Date  . A-FLUTTER ABLATION N/A 01/11/2019   Procedure: A-FLUTTER ABLATION;  Surgeon: Evans Lance, MD;  Location: [place] CV LAB;  Service: Cardiovascular;  Laterality: N/A;  . CARDIAC CATHETERIZATION    . CARDIOVERSION N/A 11/20/2018   Procedure: CARDIOVERSION;  Surgeon: Troy Sine, MD;  Location: [facility] ENDOSCOPY;  Service: Cardiovascular;  Laterality: N/A;  . cartliage removed from nose  49yrs ago  . cataract surgery Bilateral   . CERVICAL SPINE SURGERY    . COLONOSCOPY    . CORONARY ARTERY BYPASS GRAFT N/A 11/16/2017   Procedure: CORONARY ARTERY BYPASS GRAFTING (CABG) x4. LEFT ENDOSCOPIC SAPHENOUS VEIN HARVEST AND MAMMARY ARTERY TAKE DOWN. LIMA TO LAD, SVG TO PDA, SVG TO DISTAL CIRC & OMI.;  Surgeon: [name], MD;  Location: [place];  Service: Open Heart Surgery;  Laterality: N/A;  . FOREIGN BODY REMOVAL ESOPHAGEAL    . KNEE SURGERY Left    couple of times  . LEFT HEART CATH AND CORONARY ANGIOGRAPHY N/A 11/15/2017   Procedure: LEFT HEART CATH AND CORONARY ANGIOGRAPHY;  Surgeon: Troy Sine, MD;  Location: [place] CV LAB;  Service: Cardiovascular;  Laterality: N/A;  . NASAL SINUS SURGERY    . POLYPECTOMY    . TEE WITHOUT CARDIOVERSION N/A 11/16/2017    Procedure: TRANSESOPHAGEAL ECHOCARDIOGRAM (TEE);  Surgeon: [name], MD;  Location: [place];  Service: Open Heart Surgery;  Laterality: N/A;  . TONSILLECTOMY    . TOTAL KNEE ARTHROPLASTY Left 06/17/2014   Procedure: TOTAL KNEE ARTHROPLASTY;  Surgeon: Garald Balding, MD;  Location: [place];  Service: Orthopedics;  Laterality:  Left;   Social History   Occupational History  . Occupation: Owner  Tobacco Use  . Smoking status: Never Smoker  . Smokeless tobacco: Never Used  Vaping Use  . Vaping Use: Never used  Substance and Sexual Activity  . Alcohol use: Yes    Alcohol/week: 10.0 standard drinks    Types: 10 Standard drinks or equivalent per week    Comment: 2 drinks 5 nights per week  . Drug use: No  . Sexual activity: Never     Garald Balding, MD   Note - This record has been created using [employer].  Chart creation errors have been sought, but may not always  have been located. Such creation errors do not reflect on  the standard of medical care.

## 2020-03-12 ENCOUNTER — Other Ambulatory Visit: Payer: Self-pay

## 2020-03-12 ENCOUNTER — Encounter: Payer: Self-pay | Admitting: Cardiovascular Disease

## 2020-03-12 ENCOUNTER — Ambulatory Visit (INDEPENDENT_AMBULATORY_CARE_PROVIDER_SITE_OTHER): Payer: PPO | Admitting: Physical Medicine and Rehabilitation

## 2020-03-12 ENCOUNTER — Encounter: Payer: Self-pay | Admitting: Physical Medicine and Rehabilitation

## 2020-03-12 ENCOUNTER — Ambulatory Visit: Payer: PPO | Admitting: Cardiovascular Disease

## 2020-03-12 ENCOUNTER — Ambulatory Visit: Payer: Self-pay

## 2020-03-12 DIAGNOSIS — Z951 Presence of aortocoronary bypass graft: Secondary | ICD-10-CM

## 2020-03-12 DIAGNOSIS — I483 Typical atrial flutter: Secondary | ICD-10-CM | POA: Diagnosis not present

## 2020-03-12 DIAGNOSIS — G4733 Obstructive sleep apnea (adult) (pediatric): Secondary | ICD-10-CM

## 2020-03-12 DIAGNOSIS — Z8679 Personal history of other diseases of the circulatory system: Secondary | ICD-10-CM

## 2020-03-12 DIAGNOSIS — Z7901 Long term (current) use of anticoagulants: Secondary | ICD-10-CM | POA: Diagnosis not present

## 2020-03-12 DIAGNOSIS — E039 Hypothyroidism, unspecified: Secondary | ICD-10-CM

## 2020-03-12 DIAGNOSIS — Z9889 Other specified postprocedural states: Secondary | ICD-10-CM

## 2020-03-12 DIAGNOSIS — E785 Hyperlipidemia, unspecified: Secondary | ICD-10-CM

## 2020-03-12 DIAGNOSIS — I2510 Atherosclerotic heart disease of native coronary artery without angina pectoris: Secondary | ICD-10-CM

## 2020-03-12 DIAGNOSIS — M25551 Pain in right hip: Secondary | ICD-10-CM

## 2020-03-12 MED ORDER — BUPIVACAINE HCL 0.25 % IJ SOLN
4.0000 mL | INTRAMUSCULAR | Status: AC | PRN
  Administered 2020-03-12: 4 mL via INTRA_ARTICULAR

## 2020-03-12 MED ORDER — TRIAMCINOLONE ACETONIDE 40 MG/ML IJ SUSP
60.0000 mg | INTRAMUSCULAR | Status: AC | PRN
Start: 2020-03-12 — End: 2020-03-12
  Administered 2020-03-12: 60 mg via INTRA_ARTICULAR

## 2020-03-12 NOTE — Progress Notes (Signed)
Pt state right hip pain that travel from his back down to his right knee. Pt state walking makes the pain worse. Pt state pain meds help ease the pain.  Numeric Pain Rating Scale and Functional Assessment Average Pain 8   In the last MONTH (on 0-10 scale) has pain interfered with the following?  1. General activity like being  able to carry out your everyday physical activities such as walking, climbing stairs, carrying groceries, or moving a chair?  Rating(8)

## 2020-03-12 NOTE — Patient Instructions (Signed)
Medication Instructions:  Your Physician recommend you continue on your current medication as directed.    *If you need a refill on your cardiac medications before your next appointment, please call your pharmacy*   Lab Work: None   Testing/Procedures: None   Follow-Up: At [facility], you and your health needs are our priority.  As part of our continuing mission to provide you with exceptional heart care, we have created designated Provider Care Teams.  These Care Teams include your primary Cardiologist (physician) and Advanced Practice Providers (APPs -  Physician Assistants and Nurse Practitioners) who all work together to provide you with the care you need, when you need it.  We recommend signing up for the patient portal called "MyChart".  Sign up information is provided on this After Visit Summary.  MyChart is used to connect with patients for Virtual Visits (Telemedicine).  Patients are able to view lab/test results, encounter notes, upcoming appointments, etc.  Non-urgent messages can be sent to your provider as well.   To learn more about what you can do with MyChart, go to [email or web address].    Your next appointment:   6 month(s)  The format for your next appointment:   In Person  Provider:   Shelva Majestic, MD

## 2020-03-12 NOTE — Progress Notes (Signed)
   Carl [name] - 75 y.o. male MRN [id number]  Date of birth: 30-Oct-1945  Office Visit Note: Visit Date: 03/12/2020 PCP: Hulan Fess, MD Referred by: Hulan Fess, MD  Subjective: Chief Complaint  Patient presents with  . Lower Back - Pain  . Right Knee - Pain  . Right Hip - Pain   HPI:  Carl [name] is a 75 y.o. male who comes in today at the request of Dr. [name] for planned Right anesthetic hip arthrogram with fluoroscopic guidance.  The patient has failed conservative care including home exercise, medications, time and activity modification.  This injection will be diagnostic and hopefully therapeutic.  Please see requesting physician notes for further details and justification.   ROS Otherwise per HPI.  Assessment & Plan: Visit Diagnoses:    ICD-10-CM   1. Pain in right hip  M25.551 Large Joint Inj: R hip joint    XR C-ARM NO REPORT    Plan: No additional findings.   Meds & Orders: No orders of the defined types were placed in this encounter.   Orders Placed This Encounter  Procedures  . Large Joint Inj: R hip joint  . XR C-ARM NO REPORT    Follow-up: Return for visit to requesting physician as needed.   Procedures: Large Joint Inj: R hip joint on 03/12/2020 2:00 PM Indications: pain and diagnostic evaluation Details: 22 G needle, anterior approach  Arthrogram: Yes  Medications: 4 mL bupivacaine 0.25 %; 60 mg triamcinolone acetonide 40 MG/ML Aspirate: 1 mL serous and clear Outcome: tolerated well, no immediate complications  Arthrogram demonstrated excellent flow of contrast throughout the joint surface without extravasation or obvious defect.  The patient had relief of symptoms during the anesthetic phase of the injection.  Procedure, treatment alternatives, risks and benefits explained, specific risks discussed. Consent was given by the patient. Immediately prior to procedure a time out was called to verify the correct patient, procedure, equipment,  support staff and site/side marked as required. Patient was prepped and draped in the usual sterile fashion.          Clinical History: No specialty comments available.     Objective:  VS:  HT:    WT:   BMI:     BP:   HR: bpm  TEMP: ( )  RESP:  Physical Exam   Imaging: No results found.

## 2020-03-12 NOTE — Progress Notes (Signed)
Cardiology Office Note    Date:  03/14/2020   ID:  Carl [name], DOB 04-Jan-1946, MRN [id number]  PCP:  Carl Fess, MD  Cardiologist:  Carl Majestic, MD   8 month follow-up evaluation   History of Present Illness:  Carl [name] is a 75 y.o. male who presents to the office today for an 8 month follow-up evaluation.  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  In 2009 he developed transient numbness of his right face which lasted for several minutes and at that time apparently underwent evaluation and had a normal MRI, MRA, and carotid duplex studies.  He was found to have only mild to moderate plaque in the proximal right internal carotid artery without stenosis.  I had seen him in March 2016 for preoperative cardiology clearance prior to undergoing left knee replacement surgery by Dr. Durward [name].  Preoperative ECG suggested possible junctional rhythm which was new from an ECG of 2012 which previously had only shown sinus bradycardia.  He was asymptomatic.  When I saw him, his ECG showed sinus rhythm at 61 bpm with normal intervals and on that ECG P waves were normal and upright inferiorly.  Carl [name] has remained fairly active. In 2019  he had not been exercising as much as he had in the past still working out with a trainer and denied any associated chest pain or palpitations.  He in September 201 19 he began to notice more shortness of breath with walking up steps.  He was  at a friend's house and was told that he appeared to be more short of breath than previously.  I saw him for evaluation of his exertional shortness of breath on October 24, 2017.  At that time, I recommended that he undergo a 2D echo Doppler study and scheduled him for coronary CT angiography.  His echo Doppler study demonstrated hyperdynamic LV function with an EF of 65 to 70%.  Doppler parameters suggest grade 2 diastolic dysfunction and elevated ventricular  end-diastolic filling pressure.  He had mild MR, mild TR, and mild dilation of his left atrium.  Pulmonary pressures were normal.  CT coronary angiography was performed on November 09, 2017.  This was abnormal and demonstrated an elevated calcium score a 25 which is 78th percentile for age and sex.  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  There was greater than 75% calcific plaque in his proximal and mid diagonal 1 vessel.  The circumflex had 50% proximal plaque.  There was 50 to 75% mixed plaque in the OM 2 vessel less than 50% in the OM1 vessel.  His RCA had 50% calcified plaque proximally, 50 to 75% calcified plaque in the mid vessel.  There was mild aortic root dilatation at 4.1 cm.  His CT images were referred for [name] analysis which were positive in the mid RCA 0.78, the mid LAD 0.78 and in the AV groove circumflex at 0.71.   I saw him in follow-up of the above studies and recommended cardiac catheterization.  Catheterization was done in November 15, 2017 which showed severe multivessel CAD with 85% ostial LAD stenosis, diffuse 75% mid LAD stenosis, total occlusion of the mid AV groove circumflex after the takeoff of the second obtuse marginal vessel with 50% diffuse stenosis in the first large marginal branch, 80 and  90% stenoses in the second obtuse marginal vessel and extensive collateralization to the distal circumflex and third marginal via the LAD.  He had 1650% mid RCA stenoses and a dominant RCA.  Of note, he had probable high right radial artery takeoff with spasm/coarse stenosis above the elbow and there was retrograde filling of the brachial artery and the catheterization was transition to the femoral approach.  He underwent successful CABG revascularization surgery the following day by Dr. Roxy [name] on November 16, 2017 with a LIMA graft to his LAD, SVG to first marginal and distal  circumflex, and SVG to the PDA of his RCA with left thigh and calf endoscopic vein harvest.  He developed atrial fibrillation with RVR on postop day 2 and was started on IV amiodarone.  He ultimately converted back to sinus rhythm but developed recurrent AF on postop day 4 on metoprolol and amiodarone and ultimately converted back to sinus rhythm.  He was started on Eliquis on day 5 for anticoagulation.  He was discharged on November 22, 2017.  Approximately 10 days later he started to develop left lower extremity swelling and redness with pain in the back of his calf.  He was evaluated by Carl Handler, PA-C at [facility] on December 04, 2017.  There was no sign of infection.  Venous duplex imaging did not reveal any DVT.  He saw Dr. Servando [name] for initial follow-up evaluation on December 14, 2017 at which time he was maintaining sinus rhythm.  He will be participating in cardiac rehab and his orientation is scheduled for February 15, 2018.  He denied any recurrent chest pain.  His breathing has improved with walking.  He went to the beach and was walking at least a mile per day.  He is sleeping better and snoring is less.    Since his November 2019 and prior to his February 2020 evaluation he was doing well and was  participating in cardiac rehabilitation and also had joined a gym. He was exercising regularly.  He denied chest pain, PND, or orthopnea.  Subsequent laboratory showed further TSH elevation.  His levothyroxine was increased to 75 mcg.  Amiodarone was discontinued due to LFT elevation and his dose of rosuvastatin was reduced to one half of the current dose.  I  saw him in February 2020 at that time he had not had follow-up laboratory.    He had had follow-up laboratory which continued to show hypothyroidism and his dose of levothyroxine has gradually been increased.  Recent laboratory done by Dr. Rex [name] several months ago continues to show a TSH was elevated and his levothyroxine dose was increased from 100 to 125  mcg.  During the COVID-19 pandemic, he admits to weight gain.  He has not been exercising as much as he had in the past.  Although he felt well, when recently seen by Dr. Durward [name] he was felt to potentially be more short of breath.  He was advised to see me back for follow-up evaluation.  I scheduled him to undergo repeat laboratory which was done on October 05, 2018.  Hemoglobin 15.4, hematocrit 44.2.  Lipid studies were significantly improved from 6 months previously with total cholesterol now 143, triglycerides 142, HDL 48, LDL 67.  Brain natruretic peptide was 131.  TSH had significantly improved but was still elevated at 15.9.  He had normal renal function with a BUN of 15 creatinine 0.96.  LFTs were significantly improved but minimally increased with an AST of 46 and ALT at 49.  He denied any chest pain or awareness of palpitations.  I saw him for reevaluation on October 08, 2018.  During that evaluation, I reviewed his laboratory and felt he was euvolemic.  His ECG showed normal sinus rhythm at 60 bpm with an isolated PVC.  His QTc interval was 434 ms.  With his continued TSH elevation, levothyroxine was further titrated to 150 mcg.  Subsequently, Cardin has continued to feel well.  He denied any awareness of palpitations.  Last week, in October 29, 2018 when checking his Apple watch he saw report that his heart rate was increased in the upper 130s.  He was unaware of this irregularity.  Upon further questioning, he had to go out of town over the last several days and believes he may have missed taking his morning metoprolol dose on Friday and Saturday and he could not remember if he had taken it yesterday morning.  He was supposed to be taken metoprolol tartrate 25 mg twice a day, HCTZ on a as needed basis which he has not needed.  He continues to be on Eliquis 5 mg twice a day and is tolerated the levothyroxine 150 mcg dose.  He is now on the vascepa which was just renewed last week 2 capsules twice a day,  rosuvastatin 20 mg daily for mixed hyperlipidemia.  After receiving his phone call, I advised that he come to the office to check an EKG.  With his ECG showing atrial flutter at a rate of 130 he was added onto my schedule for further evaluation.  During that evaluation, he was asymptomatic and felt well.  His ECG showed atrial flutter with variable block at 130 bpm with nonspecific ST-T changes.  He was on Eliquis for anticoagulation.  I recommended titration of metoprolol tartrate to 50 mg twice a day.  Laboratory drawn that day revealed a TSH 5.95, magnesium 2.3, potassium 4.7, BUN 14 creatinine 0.9.   I saw him on September 28 for follow-up evaluation.  He was feeling well and continue to be unaware of his heart rate irregularity.  However according to his Apple Watch, his heart rate was running in the 115-120 range   He denied chest pain.  He denied tremors.  He was sleeping well and denied any awareness of sleep apnea.  During that evaluation we discussed gradually him for DC cardioversion but this would require COVID-19 testing with quarantine for 3 days prior to having the procedure done.  His daughter was coming into town that weekend.  As result we further titrated his metoprolol to 75 mg twice a day which he did for 4 days and then increase to 200 mg twice a day.  He continues to be on Eliquis for anticoagulation.  He admits to increased fatigue but denies chest pain.   I saw Carl [name] in follow-up on November 15, 2018.  At that time he had titrated the metoprolol up to 100 mg twice a day.  He underwent successful DC cardioversion on November 20, 2018 with restoration of sinus rhythm.  Of note, he developed hypotension prior to awakening from propofol and received normal saline and Neo-Synephrine.  Blood pressure normalized loud snoring was noted, and it was advised to consider sleep study evaluation.  Over the month after his cardioversion Carl [name] initially did well but he inadvertently reduced his metoprolol  from I suggested dose reduction to 75 mg twice a day and apparently has only been taking 25 mg twice a day.  In addition, he has had  difficulty with sinus congestion and was taking medication with pseudoephedrine.  He called the office on 12/20/18  stating that his Apple Watch had noted that his heart rate had been increased for almost a week and when I spoke with him his resting pulse was 135.  At that time I recommended he increase metoprolol to 50 mg twice a day and see me on the following day on December 21, 2018.  During that evaluation, he remained in atrial flutter with variable block at a rate of 115 bpm.  Recommended titration of metoprolol tartrate to 100 mg twice a day.  I discussed EP evaluation with consideration for atrial flutter ablation.  I again will had a lengthy discussion regarding the need for a sleep study due to high likelihood of obstructive sleep apnea.  He initially preferred trying the metoprolol increased dose to see if this would benefit him prior to an immediate EP evaluation.   I saw him in follow-up on the increase metoprolol 100 mg grams twice a day dosing at that time he was in 2-1 flutter with ventricular rate at 130.  I strongly recommended EP evaluation with atrial flutter ablation and discussed the case with Dr. Lovena Le who agreed to see the patient in the office on December 3 and plan to do atrial flutter ablation on December 4.  Since his sleep study was tentatively scheduled for December 2 I recommended that this be deferred until after his ablation.   He underwent successful ablation of his atrial flutter on January 11, 2019 which was isthmus-dependent right atrial flutter upon presentation to the EP lab.  He was able to be successfully treated and was discharged later that evening in sinus rhythm.  I saw him on January 16, 2019 after he called the office the day previously complaining of some shortness of breath.  He again called the office this morning stating that  he was more short of breath, very fatigued and having no energy.  As result I added him onto my schedule  for further evaluation.  He denied any chest pain and was unaware of any palpitations; per his apple watch monitoring his resting pulse yesterday was 56 and today 57.  During that evaluation, I recommended that he undergo a chest x-ray, follow-up 2D echo Doppler study, and scheduled him for [place] study to make certain his dyspnea was not ischemia mediated.  His echo Doppler study showed EF at 60 to 65% with mild LVH.  There was abnormal septal motion consistent with his postoperative state.  There was grade 2 diastolic dysfunction.  He had severe biatrial enlargement.  A chest x-ray did not reveal any active disease.  A Lexiscan Myoview study was low risk without ischemia with nuclear stress EF at 60%.  He underwent his sleep study on January 25, 2019.  Apparently several days prior to his sleep study he apparently again ran out of his metoprolol.  Of note during the sleep study his ECG revealed that he was in atrial fibrillation.  He was found to have severe obstructive sleep apnea in a split-night protocol with an AHI of 63.5/h.  CPAP was initiated but due to continued events was transitioned to BiPAP therapy and he was ultimately titrated to 18/14.  AHI at this level was 3.6 with oxygen nadir 93%.    I saw him for follow-up on February 14, 2019.  At that time he had resumed metoprolol.  His set up date for BiPAP was February 04, 2019.  Over the past week and a half on therapy he has noticed significant improvement in his previous fatigability.  A download was obtained since his set up date and he was 100% compliant since December 28.  He had a mask leak.  At 18/14 BiPAP pressure AHI was improved but still increased at 6.3.  There were no central events.    Carl [name] was reevaluated by Dr. Lovena Le on February 19, 2019.  He was doing well and maintaining sinus rhythm.  Apparently was recommended that  he reduce his metoprolol to 50 twice a day but apparently this has not been done.  I saw him on March 18 for his initial initial sleep clinic evaluation.  Of note, a download from January 8 through March 05, 2019 shows that he is meeting compliance standards with 84% of usage days and averaging 6 hours and 20 minutes.  AHI was 7.9 and his BiPAP device was set at a minimum EPAP of 12 with maximum IPAP of 22.  Apparently over the past month, Carl [name] states that he has lost 10 pounds by essentially giving up bread and reducing carbohydrates.  He believes with his weight loss he was not needing CPAP.  He had issues with mask leak and apparently went to get a new mask which was a ResMed air fit F 30i from [facility].  Apparently, he never adjusted the straps to fit his head and as result this resulted in significant leakage and consequently for the last 30 days he has only used CPAP for 5 days and only for 2 hours and 6 minutes on average.  AHI was 9.6 /h.  During that evaluation I had a lengthy discussion with him again stressing the importance of increased incidence of recurrent atrial fibrillation with his severe sleep apnea if left untreated.  He was not wearing his mask appropriately and we spent significant time education and instruction on how to place the mask on to fit well without significant leak.  At that time I reduced his metoprolol down to 75 mg in the morning and 50 mg at night.  I last saw him in June 2021 during the prior several months he had felt well.  Over the past several months he has felt well.  Again there were times where he was not typically compliant with BiPAP use but over the past several weeks prior to his evaluation he had been using it more regularly and has noticed more energy.  Several weeks ago when he was not using it he did experience significant fatigability.  He is unaware of any breakthrough atrial fibrillation or flutter.  He denies recurrent anginal symptoms.  I  obtained a new download  from May 25 through July 31, 2019.  This revealed reduced compliance with 19 out of 30 days but over the past several weeks he has been much more consistent with CPAP use and only missed 2 days.  At his minimum EPAP pressure of 10 and maximum pressure of 25 AHI was increased at 11.  His 95th percentile pressure was 17.1/13.1 with a maximum average pressure 18.1/14.1.  Since I last saw Carl [name], he is no longer dating his prior girlfriend.  When they were together, she was assisting him in meeting BiPAP compliance.  Apparently, Carl [name] stopped using BiPAP therapy and since September has only used on several occasions with approximately 8 days in October 1 day November and 1 day in December.  He is unaware of any breakthrough palpitations or atrial flutter.  He denies chest pain or shortness of breath.  He denies any bleeding on Eliquis.  He admits to weight gain.  He is resuming exercise.  He would still like to undergo surgery to his right eyelid which continuously droops and inhibits his breathing capabilities.  He presents for evaluation.  Past Medical History:  Diagnosis Date  . Anxiety    takes Xanax daily as needed  . Arthritis   . Cataract    removed both eyes  . Depression    takes Celexa daily  . Diverticulitis   . Diverticulosis   . Enlarged prostate    sightly  . Esophagitis   . Gastric ulcer   . GERD (gastroesophageal reflux disease)    takes Protonix daily  . Hx of adenomatous colonic polyps    benign  . Hyperlipidemia    takes Zocor daily  . Hypertension   . Insomnia    takes Ambien nightly  . Joint pain   . Joint swelling     Past Surgical History:  Procedure Laterality Date  . A-FLUTTER ABLATION N/A 01/11/2019   Procedure: A-FLUTTER ABLATION;  Surgeon: Evans Lance, MD;  Location: [place];  Service: Cardiovascular;  Laterality: N/A;  . CARDIAC CATHETERIZATION    . CARDIOVERSION N/A 11/20/2018   Procedure: CARDIOVERSION;  Surgeon:  Troy Sine, MD;  Location: [facility];  Service: Cardiovascular;  Laterality: N/A;  . cartliage removed from nose  76yr ago  . cataract surgery Bilateral   . CERVICAL SPINE SURGERY    . COLONOSCOPY    . CORONARY ARTERY BYPASS GRAFT N/A 11/16/2017   Procedure: CORONARY ARTERY BYPASS GRAFTING (CABG) x4. LEFT ENDOSCOPIC SAPHENOUS VEIN HARVEST AND MAMMARY ARTERY TAKE DOWN. LIMA TO LAD, SVG TO PDA, SVG TO DISTAL CIRC & OMI.;  Surgeon: GGrace Isaac MD;  [facility] [facility]  Service: Open Heart Surgery;  Laterality: N/A;  . FOREIGN BODY REMOVAL ESOPHAGEAL    . KNEE SURGERY Left    couple of times  . LEFT HEART CATH AND CORONARY ANGIOGRAPHY N/A 11/15/2017   Procedure: LEFT HEART CATH AND CORONARY ANGIOGRAPHY;  Surgeon: KTroy Sine MD;  [facility];  Service: Cardiovascular;  Laterality: N/A;  . NASAL SINUS SURGERY    . POLYPECTOMY    . TEE WITHOUT CARDIOVERSION N/A 11/16/2017   Procedure: TRANSESOPHAGEAL ECHOCARDIOGRAM (TEE);  Surgeon: GGrace Isaac MD;  [facility] [facility]  Service: Open Heart Surgery;  Laterality: N/A;  . TONSILLECTOMY    . TOTAL KNEE ARTHROPLASTY Left 06/17/2014   Procedure: TOTAL KNEE ARTHROPLASTY;  Surgeon: PGarald Balding MD;  [place] [facility]  Service: Orthopedics;  Laterality: Left;    Current Medications: Outpatient Medications Prior to Visit  Medication Sig Dispense Refill  . acetaminophen (TYLENOL) 500 MG tablet Take 1,000 mg by mouth every 6 (six) hours as needed for moderate pain or headache.    .Marland Kitchenacyclovir (ZOVIRAX) 400 MG tablet Take 400 mg by mouth 2 (two) times daily.    .Marland Kitchenaspirin EC 81 MG tablet Take 1 tablet (81 mg total) by mouth daily. 90 tablet 3  . citalopram (CELEXA) 20 MG tablet Take 20 mg by mouth daily.    .Marland KitchenELIQUIS 5 MG TABS tablet TAKE 1 TABLET(5 MG) BY MOUTH TWICE DAILY 180 tablet 1  . ferrous sulfate 325 (65 FE) MG tablet Take 325 mg by mouth daily.    . Glucosamine HCl (GLUCOSAMINE PO) Take 1 tablet by mouth  2 (two)  times daily.    Marland Kitchen levothyroxine (SYNTHROID) 150 MCG tablet TAKE 1 TABLET(150 MCG) BY MOUTH DAILY 90 tablet 1  . methylPREDNISolone (MEDROL DOSEPAK) 4 MG TBPK tablet Take as directed on package. 21 tablet 0  . metoprolol tartrate (LOPRESSOR) 50 MG tablet Take 1 tablet (50 mg total) by mouth 2 (two) times daily. 180 tablet 3  . Multiple Vitamins-Minerals (PRESERVISION AREDS 2 PO) Take 1 tablet by mouth 2 (two) times daily.    . nitroGLYCERIN (NITROSTAT) 0.4 MG SL tablet DISSOLVE 1 TABLET UNDER THE TONGUE EVERY 5 MINUTES AS NEEDED FOR CHEST PAIN 25 tablet 2  . pantoprazole (PROTONIX) 20 MG tablet TAKE 2 TABLETS(40 MG) BY MOUTH DAILY 180 tablet 1  . rosuvastatin (CRESTOR) 20 MG tablet Take 1 tablet (20 mg total) by mouth daily. 90 tablet 2  . sildenafil (REVATIO) 20 MG tablet Take 60 mg by mouth daily as needed for erectile dysfunction.    . traMADol (ULTRAM) 50 MG tablet Take 1 tablet (50 mg total) by mouth 3 (three) times daily as needed. 30 tablet 1  . traMADol (ULTRAM) 50 MG tablet 1-2 tablets PO BID 40 tablet 0  . VASCEPA 1 g capsule TAKE 2 CAPSULES(2 GRAMS) BY MOUTH TWICE DAILY 360 capsule 1  . zolpidem (AMBIEN) 10 MG tablet Take 10 mg by mouth at bedtime.    . hydrochlorothiazide (MICROZIDE) 12.5 MG capsule Take 1 capsule (12.5 mg total) by mouth as needed (swelling). 30 capsule 3   No facility-administered medications prior to visit.     Allergies:   Patient has no known allergies.   Social History   Socioeconomic History  . Marital status: Single    Spouse name: Not on file  . Number of children: 3  . Years of education: Not on file  . Highest education level: Not on file  Occupational History  . Occupation: Owner  Tobacco Use  . Smoking status: Never Smoker  . Smokeless tobacco: Never Used  Vaping Use  . Vaping Use: Never used  Substance and Sexual Activity  . Alcohol use: Yes    Alcohol/week: 10.0 standard drinks    Types: 10 Standard drinks or equivalent per week     Comment: 2 drinks 5 nights per week  . Drug use: No  . Sexual activity: Never  Other Topics Concern  . Not on file  Social History Narrative  . Not on file   Social Determinants of Health   Financial Resource Strain: Not on file  Food Insecurity: Not on file  Transportation Needs: Not on file  Physical Activity: Not on file  Stress: Not on file  Social Connections: Not on file    Additional social history is notable in that he is divorced x2.  He has 3 children and his oldest son had lived in [place], and currently is back in [place] searching for new employment.  His second son currently had lived [place] and was married in [place] and [place] he moved back to the [place] area.  His daughter works in [place] as a Government social research officer in a Web designer.  There is no tobacco use.  He drinks alcohol.  Family History:  The patient's family history includes Colon cancer in his paternal grandfather.  Mother died at age 67 with emphysema.  His father died at 50.  He has 2 sisters ages 69 and 27.  ROS General: Negative; No fevers, chills, or night sweats; positive for fatigue HEENT: Decreased hearing with hearing  aid in right ear, no sinus congestion, difficulty swallowing; right eyelid droop Pulmonary: Negative; No cough, wheezing, shortness of breath, hemoptysis Cardiovascular: See HPI GI: History of diverticular disease and colonic polyps. GU: Remote history of genital herpes; erectile dysfunction Musculoskeletal: Negative; no myalgias, joint pain, or weakness Hematologic/Oncology: Negative; no easy bruising, bleeding Endocrine: Positive for hypothyroidism Neuro: Negative; no changes in balance, headaches Skin: Negative; No rashes or skin lesions Psychiatric:  Sleep: Positive snoring snoring, previous fatigue; documented to have severe OSA with recommended treatment with BiPAP therapy; no bruxism, restless legs, hypnogognic hallucinations, no cataplexy Other  comprehensive 14 point system review is negative.   PHYSICAL EXAM:   VS:  BP 108/64   Pulse (!) 51   Ht 5' 7.5" (1.715 m)   Wt 204 lb (92.5 kg)   BMI 31.48 kg/m     Repeat blood pressure by me was 124/64 supine and 116/66 standing , Wt Readings from Last 3 Encounters:  03/12/20 204 lb (92.5 kg)  02/11/20 194 lb (88 kg)  08/01/19 203 lb 9.6 oz (92.4 kg)    General: Alert, oriented, no distress.  Skin: normal turgor, no rashes, warm and dry HEENT: Normocephalic, atraumatic. Pupils equal round and reactive to light; sclera anicteric; extraocular muscles intact;  Nose without nasal septal hypertrophy Mouth/Parynx benign; Mallinpatti scale 3 Neck: No JVD, no carotid bruits; normal carotid upstroke Lungs: clear to ausculatation and percussion; no wheezing or rales Chest wall: without tenderness to palpitation Heart: PMI not displaced, RRR, s1 s2 normal, 1/6 systolic murmur, no diastolic murmur, no rubs, gallops, thrills, or heaves Abdomen: soft, nontender; no hepatosplenomehaly, BS+; abdominal aorta nontender and not dilated by palpation. Back: no CVA tenderness Pulses 2+ Musculoskeletal: full range of motion, normal strength, no joint deformities Extremities: no clubbing cyanosis or edema, Homan's sign negative  Neurologic: grossly nonfocal; Cranial nerves grossly wnl Psychologic: Normal mood and affect   Studies/Labs Reviewed:   ECG (independently read by me): Sinus bradycardia 51 bpm. Possible left atrial enlargement. VH. No ectopy. Normal intervals.  June 2021 ECG (independently read by me):Sinus bradycardia at 53; QTc 437 msec  March 18, 2021ECG (independently read by me):Sinus bradycardia at 48; QTc 441 msec; No ST changes; no ectopy  January 7, 2021ECG (independently read by me): Sinus Bradycardia at 51; no STT changes; QTc 442 msec; no ectopy  January 16, 2019 ECG (independently read by me): Sinus bradycardia at 57 bpm with possible left atrial enlargement.  PR  interval 176 ms, QTc interval 476 ms.  No ST segment changes.  January 02, 2019 ECG (independently read by me): Atrial flutter with 2-1 AV conduction with a ventricular rate of 130 bpm.  December 21, 2018 ECG (independently read by me): Atrial flutter with variable block at 115, QT 402,QTC 556 msec  Cardioversion November 20, 2018 with restoration of sinus rhythm.  November 15, 2018 ECG (independently read by me): Atrial Flutter with variable block at 97 bpm  September 2020 ECG (independently read by me): Atrial flutter with variable block with an average heart rate at 115 bpm  October 29, 2018 ECG (independently read by me): Atrial flutter with variable block 130 bpm.  No specific ST-T changes  October 08, 2018 ECG (independently read by me): NSR at 60; isolated PVC; QTc 434 msec.  April 02, 2018 ECG (independently read by me): NSR 62; Nonspecific T change; QTc 452 msec  December 29, 2017 ECG (independently read by me): Sinus bradycardia at 49 bpm.  Nonspecific ST changes.  QTc  interval 464 ms.  No ectopy.  ECG (independently read by me): Sinus rhythm at 64 bpm.  Right bundle branch block with repolarization changes.  No ectopy.  October 24, 2017 ECG (independently read by me): Normal sinus rhythm at 62 bpm.  Right bundle branch block with repolarization changes.   ECG  April 18, 2014 revealed normal sinus rhythm.  Right bundle branch block was not present.  Recent Labs: BMP Latest Ref Rng & Units 01/16/2019 01/10/2019 11/16/2018  Glucose 65 - 99 mg/dL 79 116(H) 103(H)  BUN 8 - 27 mg/dL _0 Creatinine 0.76 - 1.27 mg/dL 0.75(L) 0.90 0.87  BUN/Creat Ratio 10 - _1 Sodium 134 - 144 mmol/L 139 138 138  Potassium 3.5 - 5.2 mmol/L 4.4 4.8 4.8  Chloride 96 - 106 mmol/L 102 101 100  CO2 20 - 29 mmol/L _2 Calcium 8.6 - 10.2 mg/dL 8.7 9.4 9.2     Hepatic Function Latest Ref Rng & Units 11/16/2018 10/05/2018 04/03/2018  Total Protein 6.0 - 8.5 g/dL 6.6 6.7 6.8   Albumin 3.7 - 4.7 g/dL 4.1 4.6 4.3  AST 0 - 40 IU/L 37 46(H) 32  ALT 0 - 44 IU/L 41 49(H) 32  Alk Phosphatase 39 - 117 IU/L 124(H) 91 183(H)  Total Bilirubin 0.0 - 1.2 mg/dL 0.4 0.4 0.4  Bilirubin, Direct 0.00 - 0.40 mg/dL - - -    CBC Latest Ref Rng & Units 01/16/2019 01/10/2019 11/16/2018  WBC 3.4 - 10.8 x10E3/uL 6.4 7.7 7.8  Hemoglobin 13.0 - 17.7 g/dL 14.3 15.4 14.0  Hematocrit 37.5 - 51.0 % 41.9 45.2 40.2  Platelets 150 - 450 x10E3/uL 159 204 211   Lab Results  Component Value Date   MCV 95 01/16/2019   MCV 95 01/10/2019   MCV 96 11/16/2018   Lab Results  Component Value Date   TSH 4.050 01/16/2019   Lab Results  Component Value Date   HGBA1C 5.4 11/15/2017     BNP    Component Value Date/Time   BNP 131.4 (H) 10/05/2018 1010    ProBNP No results found for: PROBNP   Lipid Panel     Component Value Date/Time   CHOL 143 10/05/2018 1010   TRIG 142 10/05/2018 1010   HDL 48 10/05/2018 1010   CHOLHDL 3.0 10/05/2018 1010   CHOLHDL 3.4 01/29/2007 0410   VLDL 17 01/29/2007 0410   LDLCALC 67 10/05/2018 1010     RADIOLOGY: XR C-ARM NO REPORT  Result Date: 03/12/2020 Please see Notes tab for imaging impression.  XR HIP UNILAT W OR W/O PELVIS 2-3 VIEWS RIGHT  Result Date: 03/05/2020 AP pelvis and lateral of the right hip demonstrate significant narrowing of the superior joint space right hip.  No acute changes.  No ectopic calcification.  Films are consistent with osteoarthritis which fits his clinical exam    Additional studies/ records that were reviewed today include:  I reviewed his prior office visits and most recent DC cardioversion.  CONCLUSIONS:  1. Isthmus-dependent right atrial flutter upon presentation.  2. Successful radiofrequency ablation of atrial flutter along the cavotricuspid isthmus with complete bidirectional isthmus block achieved.  3. No inducible arrhythmias following ablation.  4. No early apparent complications.   Carl Peru,  MD  12:37 PM 01/11/2019   SPLIT NIGHT SLEEP STUDY: 01/25/2019 SLEEP STUDY TECHNIQUE As per the AASM Manual for the Scoring of Sleep and Associated Events v2.3 (April 2016) with a hypopnea requiring 4% desaturations.  The channels recorded and monitored were frontal, central and occipital EEG, electrooculogram (EOG), submentalis EMG (chin), nasal and oral airflow, thoracic and abdominal wall motion, anterior tibialis EMG, snore microphone, electrocardiogram, and pulse oximetry. Bi-level positive airway pressure (BiPAP) was initiated when the patient met split night criteria and was titrated according to treat sleep-disordered breathing.  RESPIRATORY PARAMETERS Diagnostic Total AHI (/hr):            63.5     RDI (/hr):         65.8     OA Index (/hr):            17.7     CA Index (/hr):      5.9 REM AHI (/hr):            N/A      NREM AHI (/hr):          63.5     Supine AHI (/hr):         63.5     Non-supine AHI (/hr):        N/A Min O2 Sat (%):          81.0     Mean O2 (%):  93.0     Time below 88% (min):           18.7       Titration Optimal IPAP Pressure (cm): 18        Optimal EPAP Pressure (cm):            14        AHI at Optimal Pressure (/hr):          3.6       Min O2 at Optimal Pressure (%):       93.0 Sleep % at Optimal (%):         97        Supine % at Optimal (%):       7            SLEEP ARCHITECTURE The study was initiated at 10:10:34 PM and terminated at 4:34:09 AM. The total recorded time was 383.6 minutes. EEG confirmed total sleep time was 333.3 minutes yielding a sleep efficiency of 86.9%%. Sleep onset after lights out was 0.8 minutes with a REM latency of 195.0 minutes. The patient spent 1.5%% of the night in stage N1 sleep, 80.9%% in stage N2 sleep, 0.0%% in stage N3 and 17.6% in REM. Wake after sleep onset (WASO) was 49.5 minutes. The Arousal Index was 32.0/hour.  LEG MOVEMENT DATA The total Periodic Limb Movements of Sleep (PLMS) were 0. The PLMS index was 0.0  .  CARDIAC DATA The 2 lead EKG demonstrated sinus rhythm. The mean heart rate was 100.0 beats per minute. Other EKG findings include: None.  IMPRESSIONS - Severe obstructive sleep apnea occurred during the diagnostic portion of the study (AHI 63.5 /h; RDI 65.8/h). CPAP was initiated at 5 cm and was titrated to 9 cm . Due to frequent central events, BiPAP was implemented at 11/7 and was titrated to 18/14 cm of water: AHI 3.6/h, O2 nadir 93%. 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 - Moderate oxygen desaturation was noted during the diagnostic portion of the study (Min O2 = 81.0%). - The patient snored with loud snoring volume during the diagnostic portion of the study. - It appears that the patient was in atrial fibrillation throughtout the study. - Clinically significant  periodic limb movements of sleep did not occur during the study.  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 A Small size Fisher[employer]Paykel Full Face Mask Simplus mask was used for the titration. If patient continues to experience central events, ASV titration may be necessary. - Effort should be made to optimize nasal and oropharyngeal patency. - Avoid alcohol, sedatives and other CNS depressants that may worsen sleep apnea and disrupt normal sleep architecture. - Sleep hygiene should be reviewed to assess factors that may improve sleep quality. - Weight management and regular exercise should be initiated or continued. - Recoomend f/u cardiology evalution for probable recurrent atrial fibrillation.  - Recommend a download in 30 days and sleep clinic evaluation after 30 days of therapy.  [Electronically signed] 01/27/2019 04:13 PM  Carl Majestic MD, [facility], [place], American Board of Sleep  Medicine   ASSESSMENT:    1. CAD in native artery   2. S/P CABG x 4   3. OSA (obstructive sleep apnea)   4. Typical atrial flutter ([place])   5. S/P ablation of atrial flutter   6. Hyperlipidemia LDL goal <70   7. Hypothyroidism, unspecified type   8. Anticoagulated    PLAN:  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  He has just retired at the end of December 2021.  He has a history of mild hyperlipidemia and remotely had been on simvastatin 40 mg daily and has been followed by Dr. Hulan [name].  In the fall 2019 he began to notice mild shortness of breath particularly with walking up steps.  An echo Doppler study revealed hyperdynamic LV function with an EF of 65 to 70% but with grade 2 diastolic dysfunction and abnormal tissue Doppler.  A CT coronary angiogram was suggestive of significant multivessel CAD and was FFR positive.  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 Servando [name].  His postoperative course was complicated by paroxysmal atrial fibrillation for which he was started on amiodarone and ultimately was discharged on Eliquis.  When I saw him for initial post hospital evaluation he was still on amiodarone 200 mg twice a day, metoprolol tartrate 25 mg twice a day.  His ECG showed sinus bradycardia 5 weeks status post CABG revascularization; amiodarone was decreased to 200 mg daily and subsequently discontinued when subsequent laboratory showed LFT elevation and further increase in his TSH level.  With his hypothyroidism he required increasing doses of levothyroxine, ultimately titrated to his present dose of 150 mcg daily.  He had developed episodes of atrial flutter and underwent initial cardioversion in October 2020.  Due to recurrent atrial flutter he was ultimately referred to Dr. Lovena Le and  underwent successful a flutter ablation on January 12, 2019.  Due to concerns for obstructive sleep apnea he ultimately was able to have his sleep study which confirmed my suspicion.  He was found to have severe sleep apnea with an AHI of over 60 times per hour with significant oxygen desaturation to a nadir of 80%.  When I saw him for sleep and follow-up of his ablation in early January,  he has been on BiPAP therapy for 9 days and felt marked improvement in his  previous fatigability and energy.   Of note he had run out of his metoprolol 2 days prior to his sleep study and he was back in atrial fibrillation during his sleep evaluation.  His ECG at his January visit on metoprolol 75 mg twice a day revealed  sinus rhythm with asymptomatic sinus bradycardia 51 bpm.  For his initial month of therapy, Carl [name] met compliance standards for his BiPAP.  Presently, he is not compliant and has essentially stopped using therapy.  He is no longer with his previous wound partner any is no longer using treatment.  He believes he sleeps better without it.  His blood pressure today is stable on his regimen consisting of metoprolol tartrate 50 mg twice a day.  He has a prescription for HCTZ to take as needed.  Presently there is no swelling.  He is anticoagulated with Eliquis and denies bleeding.  He has been unaware of any recurrent arrhythmia or atrial flutter.  He is on levothyroxine 150 mcg for hypothyroidism.  He continues to be on rosuvastatin 20 mg and Vascepa for hyperlipidemia.  Laboratory and September 2021 showed an LDL of 55.  Triglycerides were elevated at 192.  He has been bothered by right eyelid drooping.  He has been evaluated by Dr. Frederica Kuster a plastic surgeon who works through [facility], and has seen her for possible surgery.  Cardiac wise he is stable.  He is not having any anginal symptoms or shortness of breath.  If elective surgery is to be done he will need to hold Eliquis for minimum of 48 hours prior to the  procedure to reduce bleeding risk.  I will see him in 6 months for reevaluation or sooner as needed     Medication Adjustments/Labs and Tests Ordered: Current medicines are reviewed at length with the patient today.  Concerns regarding medicines are outlined above.  Medication changes, Labs and Tests ordered today are listed in the Patient Instructions below. Patient Instructions  Medication Instructions:  Your Physician recommend you continue on your current medication as directed.    *If you need a refill on your cardiac medications before your next appointment, please call your pharmacy*   Lab Work: None   Testing/Procedures: None   Follow-Up: At [facility], you and your health needs are our priority.  As part of our continuing mission to provide you with exceptional heart care, we have created designated Provider Care Teams.  These Care Teams include your primary Cardiologist (physician) and Advanced Practice Providers (APPs -  Physician Assistants and Nurse Practitioners) who all work together to provide you with the care you need, when you need it.  We recommend signing up for the patient portal called "MyChart".  Sign up information is provided on this After Visit Summary.  MyChart is used to connect with patients for Virtual Visits (Telemedicine).  Patients are able to view lab/test results, encounter notes, upcoming appointments, etc.  Non-urgent messages can be sent to your provider as well.   To learn more about what you can do with MyChart, go to [email or web address].    Your next appointment:   6 month(s)  The format for your next appointment:   In Person  Provider:   Shelva Majestic, MD        Signed, Carl Majestic, MD  03/14/2020 3:58 PM    [facility] [address] [place], [place], [place]  [address] Phone: [phone]

## 2020-03-14 ENCOUNTER — Encounter: Payer: Self-pay | Admitting: Cardiovascular Disease

## 2020-03-18 ENCOUNTER — Other Ambulatory Visit: Payer: Self-pay

## 2020-03-18 ENCOUNTER — Other Ambulatory Visit: Payer: Self-pay | Admitting: Orthopaedic Surgery

## 2020-03-18 DIAGNOSIS — M25551 Pain in right hip: Secondary | ICD-10-CM

## 2020-03-18 MED ORDER — TRAMADOL HCL 50 MG PO TABS: 50.0000 mg | ORAL_TABLET | Freq: Two times a day (BID) | ORAL | 1 refills | Status: DC | PRN

## 2020-04-02 ENCOUNTER — Other Ambulatory Visit: Payer: Self-pay

## 2020-04-02 ENCOUNTER — Ambulatory Visit
Admission: RE | Admit: 2020-04-02 | Discharge: 2020-04-02 | Disposition: A | Discharge status: Home / Self Care | Payer: PPO | Source: Ambulatory Visit | Attending: Orthopaedic Surgery | Admitting: Orthopaedic Surgery

## 2020-04-02 DIAGNOSIS — M25551 Pain in right hip: Secondary | ICD-10-CM | POA: Diagnosis not present

## 2020-04-07 ENCOUNTER — Encounter: Payer: Self-pay | Admitting: Orthopaedic Surgery

## 2020-04-07 ENCOUNTER — Ambulatory Visit: Payer: PPO | Admitting: Orthopaedic Surgery

## 2020-04-07 DIAGNOSIS — M87051 Idiopathic aseptic necrosis of right femur: Secondary | ICD-10-CM

## 2020-04-07 DIAGNOSIS — M25551 Pain in right hip: Secondary | ICD-10-CM

## 2020-04-07 MED ORDER — HYDROCODONE-ACETAMINOPHEN 5-325 MG PO TABS: 1.0000 | ORAL_TABLET | Freq: Four times a day (QID) | ORAL | 0 refills | Status: DC | PRN

## 2020-04-07 NOTE — Progress Notes (Signed)
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  He is 75 years old.  He is on Eliquis.  He developed acute hip pain with no known injury of the right hip in just December.  This rapidly gotten worse.  He is using a cane and occasionally walker to offload his hip.  He has had 1 steroid injection in that right hip under direct fluoroscopy and it helped for 1 day only.  His pain is severe and it is in the groin.  He is walking with a limp.  A recent MRI showed the extent of the avascular process involving his right hip.  His left hip is normal.  He has had no other acute change in medical status.  He denies any headache, chest pain, shortness of breath, fever, chills, nausea, vomiting.  I was able to review all of his records and epic.  On exam he does have a Trendelenburg gait.  His left hip moves smoothly and normally.  The right hip has severe pain with attempts of rotation.  I did go over the plain films and the MRI with him showing his right hip and the extent of edema in the femoral head as well as acetabulum and femoral neck.  There is impending femoral head collapse as well.  I went over a hip replacement model with him.  I am recommending a more acute hip replacement on him this month given the extent of his AVN and impending femoral head collapse of the right hip.  He will continue to offload his hip with a cane or walker we will work on getting him scheduled in the near future for the surgery.  He is on Eliquis I will have him stop this medication 4 days before surgery.  I went over hip replacement surgery in detail.  We discussed the interoperative and postoperative course.  I talked in detail about the risk and benefits of this type of surgery as well.  He does wish to proceed given amount of pain he is having and I agree with this as well given the clinical exam and radiographic findings.  All questions concerns  were answered and addressed.  I will send in some hydrocodone for pain.  We will work on getting him scheduled for a right anterior replacement.

## 2020-04-09 ENCOUNTER — Other Ambulatory Visit: Payer: Self-pay

## 2020-04-09 ENCOUNTER — Ambulatory Visit: Payer: PPO | Admitting: Orthopaedic Surgery

## 2020-04-10 ENCOUNTER — Other Ambulatory Visit: Payer: Self-pay | Admitting: Physician Assistant

## 2020-04-13 NOTE — Patient Instructions (Signed)
DUE TO COVID-19 ONLY ONE VISITOR IS ALLOWED TO COME WITH YOU AND STAY IN THE WAITING ROOM ONLY DURING PRE OP AND PROCEDURE DAY OF SURGERY. THE 1 VISITOR  MAY VISIT WITH YOU AFTER SURGERY IN YOUR PRIVATE ROOM DURING VISITING HOURS ONLY!  YOU NEED TO HAVE A COVID 19 TEST ON_3/8/22______ @__2 :50 pm_____, THIS TEST MUST BE DONE BEFORE SURGERY,  COVID TESTING SITE [place] [address], IT IS ON THE RIGHT GOING OUT [address] APPROXIMATELY  2 MINUTES PAST [employer] ON THE RIGHT. ONCE YOUR COVID TEST IS COMPLETED,  PLEASE BEGIN THE QUARANTINE INSTRUCTIONS AS OUTLINED IN YOUR HANDOUT.                Carl [name]    Your procedure is scheduled on: 04/17/20   Report to [facility] Main  Entrance   Report to admitting at   11:00 AM     Call this number if you have problems the morning of surgery [phone]   . BRUSH YOUR TEETH MORNING OF SURGERY AND RINSE YOUR MOUTH OUT, NO CHEWING GUM CANDY OR MINTS.   No food after midnight.    You may have clear liquid until 10:00 AM.     CLEAR LIQUID DIET   Foods Allowed                                                                     Foods Excluded  Coffee and tea, regular and decaf                             liquids that you cannot  Plain Jell-O any favor except red or purple                                           see through such as: Fruit ices (not with fruit pulp)                                     milk, soups, orange juice  Iced Popsicles                                    All solid food Carbonated beverages, regular and diet                                    Cranberry, grape and apple juices Sports drinks like Gatorade Lightly seasoned clear broth or consume(fat free) Sugar, honey syrup   At 9:30 AM drink pre surgery drink.   Nothing by mouth after 10:00 AM.   Take these medicines the morning of surgery with A SIP OF WATER: Citalopram, Metoprolol, Levothyroxine, Pantoprazole  You may not have any metal on your body including              piercings  Do not wear jewelry,  lotions, powders or deodorant                      Men may shave face and neck.   Do not bring valuables to the hospital. [place].  Contacts, dentures or bridgework may not be worn into surgery.      Patients discharged the day of surgery will not be allowed to drive home.   IF YOU ARE HAVING SURGERY AND GOING HOME THE SAME DAY, YOU MUST HAVE AN ADULT TO DRIVE YOU HOME AND BE WITH YOU FOR 24 HOURS.  YOU MAY GO HOME BY TAXI OR UBER OR ORTHERWISE, BUT AN ADULT MUST ACCOMPANY YOU HOME AND STAY WITH YOU FOR 24 HOURS.  Name and phone number of your driver:  Special Instructions: N/A              Please read over the following fact sheets you were given: _____________________________________________________________________             [facility] - Preparing for Surgery Before surgery, you can play an important role.  Because skin is not sterile, your skin needs to be as free of germs as possible.  You can reduce the number of germs on your skin by washing with CHG (chlorahexidine gluconate) soap before surgery.  CHG is an antiseptic cleaner which kills germs and bonds with the skin to continue killing germs even after washing. Please DO NOT use if you have an allergy to CHG or antibacterial soaps.  If your skin becomes reddened/irritated stop using the CHG and inform your nurse when you arrive at Short Stay.   You may shave your face/neck.  Please follow these instructions carefully:  1.  Shower with CHG Soap the night before surgery and the  morning of Surgery.  2.  If you choose to wash your hair, wash your hair first as usual with your  normal  shampoo.  3.  After you shampoo, rinse your hair and body thoroughly to remove the  shampoo.                                        4.  Use CHG as you would any other  liquid soap.  You can apply chg directly  to the skin and wash                       Gently with a scrungie or clean washcloth.  5.  Apply the CHG Soap to your body ONLY FROM THE NECK DOWN.   Do not use on face/ open                           Wound or open sores. Avoid contact with eyes, ears mouth and genitals (private parts).                       Wash face,  Genitals (private parts) with your normal soap.             6.  Wash thoroughly,  paying special attention to the area where your surgery  will be performed.  7.  Thoroughly rinse your body with warm water from the neck down.  8.  DO NOT shower/wash with your normal soap after using and rinsing off  the CHG Soap.             9.  Pat yourself dry with a clean towel.            10.  Wear clean pajamas.            11.  Place clean sheets on your bed the night of your first shower and do not  sleep with pets. Day of Surgery : Do not apply any lotions/deodorants the morning of surgery.  Please wear clean clothes to the hospital/surgery center.  FAILURE TO FOLLOW THESE INSTRUCTIONS MAY RESULT IN THE CANCELLATION OF YOUR SURGERY PATIENT SIGNATURE_________________________________  NURSE SIGNATURE__________________________________  ________________________________________________________________________   Carl [name]  An incentive spirometer is a tool that can help keep your lungs clear and active. This tool measures how well you are filling your lungs with each breath. Taking long deep breaths may help reverse or decrease the chance of developing breathing (pulmonary) problems (especially infection) following:  A long period of time when you are unable to move or be active. BEFORE THE PROCEDURE   If the spirometer includes an indicator to show your best effort, your nurse or respiratory therapist will set it to a desired goal.  If possible, sit up straight or lean slightly forward. Try not to slouch.  Hold the incentive  spirometer in an upright position. INSTRUCTIONS FOR USE  1. Sit on the edge of your bed if possible, or sit up as far as you can in bed or on a chair. 2. Hold the incentive spirometer in an upright position. 3. Breathe out normally. 4. Place the mouthpiece in your mouth and seal your lips tightly around it. 5. Breathe in slowly and as deeply as possible, raising the piston or the ball toward the top of the column. 6. Hold your breath for 3-5 seconds or for as long as possible. Allow the piston or ball to fall to the bottom of the column. 7. Remove the mouthpiece from your mouth and breathe out normally. 8. Rest for a few seconds and repeat Steps 1 through 7 at least 10 times every 1-2 hours when you are awake. Take your time and take a few normal breaths between deep breaths. 9. The spirometer may include an indicator to show your best effort. Use the indicator as a goal to work toward during each repetition. 10. After each set of 10 deep breaths, practice coughing to be sure your lungs are clear. If you have an incision (the cut made at the time of surgery), support your incision when coughing by placing a pillow or rolled up towels firmly against it. Once you are able to get out of bed, walk around indoors and cough well. You may stop using the incentive spirometer when instructed by your caregiver.  RISKS AND COMPLICATIONS  Take your time so you do not get dizzy or light-headed.  If you are in pain, you may need to take or ask for pain medication before doing incentive spirometry. It is harder to take a deep breath if you are having pain. AFTER USE  Rest and breathe slowly and easily.  It can be helpful to keep track of a log of your progress. Your caregiver can provide you with  a simple table to help with this. If you are using the spirometer at home, follow these instructions: [place] IF:   You are having difficultly using the spirometer.  You have trouble using the  spirometer as often as instructed.  Your pain medication is not giving enough relief while using the spirometer.  You develop fever of 100.5 F (38.1 C) or higher. SEEK IMMEDIATE MEDICAL CARE IF:   You cough up bloody sputum that had not been present before.  You develop fever of 102 F (38.9 C) or greater.  You develop worsening pain at or near the incision site. MAKE SURE YOU:   Understand these instructions.  Will watch your condition.  Will get help right away if you are not doing well or get worse. Document Released: 06/06/2006 Document Revised: 04/18/2011 Document Reviewed: 08/07/2006 [facility] Patient Information 2014 [place], [place].   ________________________________________________________________________

## 2020-04-14 ENCOUNTER — Other Ambulatory Visit: Payer: Self-pay

## 2020-04-14 ENCOUNTER — Encounter (HOSPITAL_COMMUNITY)
Admission: RE | Admit: 2020-04-14 | Discharge: 2020-04-14 | Disposition: A | Discharge status: Home / Self Care | Payer: PPO | Source: Ambulatory Visit | Attending: Orthopaedic Surgery | Admitting: Orthopaedic Surgery

## 2020-04-14 ENCOUNTER — Other Ambulatory Visit (HOSPITAL_COMMUNITY)
Admission: RE | Admit: 2020-04-14 | Discharge: 2020-04-14 | Disposition: A | Discharge status: Home / Self Care | Payer: PPO | Source: Ambulatory Visit | Attending: Orthopaedic Surgery | Admitting: Orthopaedic Surgery

## 2020-04-14 ENCOUNTER — Encounter (HOSPITAL_COMMUNITY): Payer: Self-pay

## 2020-04-14 DIAGNOSIS — Z01812 Encounter for preprocedural laboratory examination: Secondary | ICD-10-CM | POA: Diagnosis not present

## 2020-04-14 DIAGNOSIS — Z7901 Long term (current) use of anticoagulants: Secondary | ICD-10-CM | POA: Insufficient documentation

## 2020-04-14 DIAGNOSIS — Z79891 Long term (current) use of opiate analgesic: Secondary | ICD-10-CM | POA: Insufficient documentation

## 2020-04-14 DIAGNOSIS — Z7952 Long term (current) use of systemic steroids: Secondary | ICD-10-CM | POA: Diagnosis not present

## 2020-04-14 DIAGNOSIS — G4730 Sleep apnea, unspecified: Secondary | ICD-10-CM | POA: Insufficient documentation

## 2020-04-14 DIAGNOSIS — Z7982 Long term (current) use of aspirin: Secondary | ICD-10-CM | POA: Diagnosis not present

## 2020-04-14 DIAGNOSIS — Z79890 Hormone replacement therapy (postmenopausal): Secondary | ICD-10-CM | POA: Insufficient documentation

## 2020-04-14 DIAGNOSIS — K219 Gastro-esophageal reflux disease without esophagitis: Secondary | ICD-10-CM | POA: Insufficient documentation

## 2020-04-14 DIAGNOSIS — I2510 Atherosclerotic heart disease of native coronary artery without angina pectoris: Secondary | ICD-10-CM | POA: Diagnosis not present

## 2020-04-14 DIAGNOSIS — Z20822 Contact with and (suspected) exposure to covid-19: Secondary | ICD-10-CM | POA: Insufficient documentation

## 2020-04-14 DIAGNOSIS — M87051 Idiopathic aseptic necrosis of right femur: Secondary | ICD-10-CM | POA: Diagnosis not present

## 2020-04-14 DIAGNOSIS — Z951 Presence of aortocoronary bypass graft: Secondary | ICD-10-CM | POA: Diagnosis not present

## 2020-04-14 DIAGNOSIS — I4892 Unspecified atrial flutter: Secondary | ICD-10-CM | POA: Diagnosis not present

## 2020-04-14 DIAGNOSIS — Z79899 Other long term (current) drug therapy: Secondary | ICD-10-CM | POA: Diagnosis not present

## 2020-04-14 DIAGNOSIS — Z96652 Presence of left artificial knee joint: Secondary | ICD-10-CM | POA: Diagnosis not present

## 2020-04-14 DIAGNOSIS — M89751 Major osseous defect, right pelvic region and thigh: Secondary | ICD-10-CM | POA: Diagnosis not present

## 2020-04-14 LAB — CBC
HCT: 42.9 % (ref 39.0–52.0)
Hemoglobin: 14.8 g/dL (ref 13.0–17.0)
MCH: 34.3 pg — ABNORMAL HIGH (ref 26.0–34.0)
MCHC: 34.5 g/dL (ref 30.0–36.0)
MCV: 99.3 fL (ref 80.0–100.0)
Platelets: 212 10*3/uL (ref 150–400)
RBC: 4.32 MIL/uL (ref 4.22–5.81)
RDW: 12.6 % (ref 11.5–15.5)
WBC: 8.1 10*3/uL (ref 4.0–10.5)
nRBC: 0 % (ref 0.0–0.2)

## 2020-04-14 LAB — SURGICAL PCR SCREEN
MRSA, PCR: NEGATIVE
Staphylococcus aureus: NEGATIVE

## 2020-04-14 LAB — BASIC METABOLIC PANEL
Anion gap: 8 (ref 5–15)
BUN: 14 mg/dL (ref 8–23)
CO2: 26 mmol/L (ref 22–32)
Calcium: 9.2 mg/dL (ref 8.9–10.3)
Chloride: 104 mmol/L (ref 98–111)
Creatinine, Ser: 0.91 mg/dL (ref 0.61–1.24)
GFR, Estimated: >60 mL/min (ref >60)
Glucose, Bld: 112 mg/dL — ABNORMAL HIGH (ref 70–99)
Potassium: 4.5 mmol/L (ref 3.5–5.1)
Sodium: 138 mmol/L (ref 135–145)

## 2020-04-14 NOTE — Progress Notes (Signed)
COVID Vaccine Completed:Yes Date COVID Vaccine completed:06/27/19 COVID vaccine manufacturer: Pfizer      PCP - Dr. Lawernce Pitts Cardiologist - Dr. Corky Downs  Chest x-ray - no EKG - 03/12/20-epic Stress Test - 01/24/19-epic ECHO - 01/23/19-epic Cardiac Cath - 11/20/18-epicCABG x4 11/16/17-epic, ablation for A-fib 2020 Pacemaker/ICD device last checked:  Sleep Study - yes CPAP -Pt has not used mask because he can't tolerate the mask   Fasting Blood Sugar - NA Checks Blood Sugar _____ times a day  Blood Thinner Instructions:ASA 81 ans Eliquis/ [name] Aspirin Instructions:Stop 5 days prior to DOS/ Ninfa Linden Last Dose:04/13/20  Anesthesia review:   Patient denies shortness of breath, fever, cough and chest pain at PAT appointment yes  Patient verbalized understanding of instructions that were given to them at the PAT appointment. Patient was also instructed that they will need to review over the PAT instructions again at home before surgery. Yes Pt resp rate is 22-24/ min. He sometimes gets SOB around the house or getting ready in the AM but this is not new.

## 2020-04-15 LAB — SARS CORONAVIRUS 2 (TAT 6-24 HRS): SARS Coronavirus 2: NEGATIVE

## 2020-04-15 NOTE — Anesthesia Preprocedure Evaluation (Addendum)
Anesthesia Evaluation  Patient identified by MRN, date of birth, ID band Patient awake    Reviewed: Allergy & Precautions, NPO status , Patient's Chart, lab work & pertinent test results  Airway Mallampati: II  TM Distance: >3 FB Neck ROM: Full    Dental no notable dental hx. (+) Dental Advisory Given, Teeth Intact   Pulmonary neg pulmonary ROS,    Pulmonary exam normal breath sounds clear to auscultation       Cardiovascular + CAD, + Cardiac Stents and + CABG  Normal cardiovascular exam Rhythm:Regular Rate:Normal  Echo 01/2019 1. Left ventricular ejection fraction, by visual estimation, is 60 to 65%. The left ventricle has normal function. There is mildly increased left ventricular hypertrophy.  2. Abnormal septal motion consistent with post-operative status.  3. Left ventricular diastolic parameters are consistent with Grade II diastolic dysfunction (pseudonormalization).  4. The left ventricle has no regional wall motion abnormalities.  5. Definity contrast used due to poor acoustic windows.  6. Global right ventricle has normal systolic function.The right ventricular size is normal. No increase in right ventricular wall thickness.  7. Left atrial size was severely dilated.  8. Right atrial size was severely dilated.  9. The mitral valve is normal in structure. Trivial mitral valve regurgitation.  10. The tricuspid valve is normal in structure. Tricuspid valve regurgitation is trivial.  11. The aortic valve is normal in structure. Aortic valve regurgitation is trivial. Mild to moderate aortic valve sclerosis/calcification without any evidence of aortic stenosis.  12. The pulmonic valve was grossly normal. Pulmonic valve regurgitation is trivial.  13. Mildly elevated pulmonary artery systolic pressure.    Neuro/Psych PSYCHIATRIC DISORDERS Anxiety Depression negative neurological ROS     GI/Hepatic Neg liver ROS, PUD,  GERD  Medicated,  Endo/Other  negative endocrine ROS  Renal/GU negative Renal ROS     Musculoskeletal  (+) Arthritis ,   Abdominal   Peds  Hematology negative hematology ROS (+)   Anesthesia Other Findings   Reproductive/Obstetrics                                                              Anesthesia Evaluation  Patient identified by MRN, date of birth, ID band Patient awake    Reviewed: Allergy & Precautions, NPO status , Patient's Chart, lab work & pertinent test results  Airway Mallampati: II  TM Distance: >3 FB Neck ROM: Full    Dental no notable dental hx.    Pulmonary neg pulmonary ROS,    Pulmonary exam normal breath sounds clear to auscultation       Cardiovascular hypertension, Normal cardiovascular exam+ dysrhythmias Atrial Fibrillation  Rhythm:Irregular Rate:Normal     Neuro/Psych negative neurological ROS  negative psych ROS   GI/Hepatic Neg liver ROS, GERD  ,  Endo/Other  negative endocrine ROS  Renal/GU negative Renal ROS  negative genitourinary   Musculoskeletal negative musculoskeletal ROS (+)   Abdominal   Peds negative pediatric ROS (+)  Hematology negative hematology ROS (+)   Anesthesia Other Findings   Reproductive/Obstetrics negative OB ROS                             Anesthesia Physical Anesthesia Plan  ASA: III  Anesthesia Plan: General  Post-op Pain Management:    Induction: Intravenous  PONV Risk Score and Plan:   Airway Management Planned: Mask  Additional Equipment:   Intra-op Plan:   Post-operative Plan: Extubation in OR  Informed Consent: I have reviewed the patients History and Physical, chart, labs and discussed the procedure including the risks, benefits and alternatives for the proposed anesthesia with the patient or authorized representative who has indicated his/her understanding and acceptance.     Dental advisory  given  Plan Discussed with: CRNA and Surgeon  Anesthesia Plan Comments:         Anesthesia Quick Evaluation  Anesthesia Physical Anesthesia Plan  ASA: III  Anesthesia Plan: Spinal   Post-op Pain Management:    Induction: Intravenous  PONV Risk Score and Plan: 2 and Ondansetron, Dexamethasone, Treatment may vary due to age or medical condition and Propofol infusion  Airway Management Planned: Natural Airway  Additional Equipment: None  Intra-op Plan:   Post-operative Plan:   Informed Consent: I have reviewed the patients History and Physical, chart, labs and discussed the procedure including the risks, benefits and alternatives for the proposed anesthesia with the patient or authorized representative who has indicated his/her understanding and acceptance.     Dental advisory given  Plan Discussed with: CRNA  Anesthesia Plan Comments: (He believes last dose of eliquis was Monday PM, > 72 hrs.   See PAT note 04/14/2020, Konrad Felix, PA-C)       Anesthesia Quick Evaluation

## 2020-04-15 NOTE — Progress Notes (Signed)
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 never smoker with h/o GERD, CAD (CABG 2019), a-flutter s/p ablation 01/2019 (on Eliquis), sleep apnea, right hip AVN scheduled for above procedure 04/17/20 with Dr. Jean Rosenthal.   Pt last seen by cardiology 03/12/20. Stable at this visit.  Per OV note, "Cardiac wise he is stable.  He is not having any anginal symptoms or shortness of breath.  If elective surgery is to be done he will need to hold Eliquis for minimum of 48 hours prior to the procedure to reduce bleeding risk.  I will see him in 6 months for reevaluation or sooner as needed"  Anticipate pt can proceed with planned procedure barring acute status change.   VS: BP (!) 160/73   Pulse (!) 56   Temp 36.6 C (Oral)   Resp (!) 22   Ht 5' 8.5" (1.74 m)   Wt 87.1 kg   SpO2 97%   BMI 28.77 kg/m   PROVIDERS: Hulan Fess, MD is PCP   Shelva Majestic, MD is Cardiologist  LABS: Labs reviewed: Acceptable for surgery. (all labs ordered are listed, but only abnormal results are displayed)  Labs Reviewed  CBC - Abnormal; Notable for the following components:      Result Value   MCH 34.3 (*)    All other components within normal limits  BASIC METABOLIC PANEL - Abnormal; Notable for the following components:   Glucose, Bld 112 (*)    All other components within normal limits  SURGICAL PCR SCREEN  TYPE AND SCREEN     IMAGES:   EKG: 03/12/2020 Rate 51 bpm  Sinus bradycardia  Possible left atrial enlargement  Left ventricular hypertrophy   CV: Stress Test 01/24/2019  The left ventricular ejection fraction is normal (55-65%).  Nuclear stress EF: 60%.  There was no ST segment  deviation noted during stress.  No T wave inversion was noted during stress.  The study is normal.  This is a low risk study.   1.  Normal myocardial perfusion imaging study without evidence of ischemia or infarction. 2.  Normal left ventricular ejection fraction, 60%. 3.  Abnormal septal motion consistent with a postoperative state. 4.  This is a low risk study.  Echo 01/23/2019 IMPRESSIONS    1. Left ventricular ejection fraction, by visual estimation, is 60 to  65%. The left ventricle has normal function. There is mildly increased  left ventricular hypertrophy.  2. Abnormal septal motion consistent with post-operative status.  3. Left ventricular diastolic parameters are consistent with Grade II  diastolic dysfunction (pseudonormalization).  4. The left ventricle has no regional wall motion abnormalities.  5. Definity contrast used due to poor acoustic windows.  6. Global right ventricle has normal systolic function.The right  ventricular size is normal. No increase in right ventricular wall  thickness.  7. Left atrial size was severely dilated.  8. Right atrial size was severely dilated.  9. The mitral valve is normal in structure. Trivial mitral valve  regurgitation.  10. The tricuspid valve is normal in structure. Tricuspid valve  regurgitation is trivial.  11. The aortic valve is normal in structure. Aortic valve regurgitation is  trivial. Mild to moderate  aortic valve sclerosis/calcification without any  evidence of aortic stenosis.  12. The pulmonic valve was grossly normal. Pulmonic valve regurgitation is  trivial.  13. Mildly elevated pulmonary artery systolic pressure.  Past Medical History:  Diagnosis Date  . Anxiety    takes Xanax daily as needed  . Arthritis   . Cataract    removed both eyes  . Depression    takes Celexa daily  . Diverticulitis   . Diverticulosis   . Enlarged prostate    sightly  . Esophagitis   . Gastric ulcer   . GERD  (gastroesophageal reflux disease)    takes Protonix daily  . Hx of adenomatous colonic polyps    benign  . Hyperlipidemia    takes Zocor daily  . Insomnia    takes Ambien nightly  . Joint pain   . Joint swelling     Past Surgical History:  Procedure Laterality Date  . A-FLUTTER ABLATION N/A 01/11/2019   Procedure: A-FLUTTER ABLATION;  Surgeon: Evans Lance, MD;  Location: [place] [facility];  Service: Cardiovascular;  Laterality: N/A;  . CARDIAC CATHETERIZATION    . CARDIOVERSION N/A 11/20/2018   Procedure: CARDIOVERSION;  Surgeon: Troy Sine, MD;  Location: [facility];  Service: Cardiovascular;  Laterality: N/A;  . cartliage removed from nose  64yrs ago  . cataract surgery Bilateral   . CERVICAL SPINE SURGERY    . COLONOSCOPY    . CORONARY ARTERY BYPASS GRAFT N/A 11/16/2017   Procedure: CORONARY ARTERY BYPASS GRAFTING (CABG) x4. LEFT ENDOSCOPIC SAPHENOUS VEIN HARVEST AND MAMMARY ARTERY TAKE DOWN. LIMA TO LAD, SVG TO PDA, SVG TO DISTAL CIRC & OMI.;  Surgeon: [name], MD;  Location: [facility];  Service: Open Heart Surgery;  Laterality: N/A;  . EYE SURGERY    . FOREIGN BODY REMOVAL ESOPHAGEAL    . KNEE SURGERY Left    couple of times  . LEFT HEART CATH AND CORONARY ANGIOGRAPHY N/A 11/15/2017   Procedure: LEFT HEART CATH AND CORONARY ANGIOGRAPHY;  Surgeon: Troy Sine, MD;  Location: [place] [facility];  Service: Cardiovascular;  Laterality: N/A;  . NASAL SINUS SURGERY    . POLYPECTOMY    . TEE WITHOUT CARDIOVERSION N/A 11/16/2017   Procedure: TRANSESOPHAGEAL ECHOCARDIOGRAM (TEE);  Surgeon: [name], MD;  Location: [facility];  Service: Open Heart Surgery;  Laterality: N/A;  . TONSILLECTOMY    . TOTAL KNEE ARTHROPLASTY Left 06/17/2014   Procedure: TOTAL KNEE ARTHROPLASTY;  Surgeon: Garald Balding, MD;  Location: [place];  Service: Orthopedics;  Laterality: Left;    MEDICATIONS: . acetaminophen (TYLENOL) 500 MG tablet  . acyclovir (ZOVIRAX) 400 MG tablet   . aspirin EC 81 MG tablet  . citalopram (CELEXA) 20 MG tablet  . ELIQUIS 5 MG TABS tablet  . ferrous sulfate 325 (65 FE) MG tablet  . HYDROcodone-acetaminophen (NORCO/VICODIN) 5-325 MG tablet  . levothyroxine (SYNTHROID) 150 MCG tablet  . loratadine (CLARITIN) 10 MG tablet  . methylPREDNISolone (MEDROL DOSEPAK) 4 MG TBPK tablet  . metoprolol tartrate (LOPRESSOR) 50 MG tablet  . Multiple Vitamins-Minerals (PRESERVISION AREDS 2 PO)  . nitroGLYCERIN (NITROSTAT) 0.4 MG SL tablet  . oxymetazoline (AFRIN) 0.05 % nasal spray  . pantoprazole (PROTONIX) 20 MG tablet  . PRESCRIPTION MEDICATION  . rosuvastatin (CRESTOR) 20 MG tablet  . traMADol (ULTRAM) 50 MG tablet  . VASCEPA 1 g capsule  . zolpidem (AMBIEN) 10 MG tablet   No current facility-administered medications for this encounter.  Konrad Felix, PA-C [facility] Pre-Surgical Testing [phone]

## 2020-04-16 NOTE — H&P (Signed)
TOTAL HIP ADMISSION H&P  Patient is admitted for right total hip arthroplasty.  Subjective:  Chief Complaint: right hip pain  HPI: Carl [name], 75 y.o. male, has a history of pain and functional disability in the right hip(s) due to arthritis and patient has failed non-surgical conservative treatments for greater than 12 weeks to include NSAID's and/or analgesics, corticosteriod injections, use of assistive devices and activity modification.  Onset of symptoms was abrupt starting 1 years ago with rapidlly worsening course since that time.The patient noted no past surgery on the right hip(s).  Patient currently rates pain in the right hip at 10 out of 10 with activity. Patient has night pain, worsening of pain with activity and weight bearing, trendelenberg gait, pain that interfers with activities of daily living and pain with passive range of motion. Patient has evidence of subchondral cysts, joint space narrowing and subchondral fracture by imaging studies. This condition presents safety issues increasing the risk of falls.  There is no current active infection.  Patient Active Problem List   Diagnosis Date Noted  . Avascular necrosis of bone of right hip ([place]) 04/07/2020  . Pain in right hip 02/11/2020  . Low back pain 02/11/2020  . Typical atrial flutter ([place])   . TSH elevation 11/21/2017  . Atrial fibrillation with rapid ventricular response ([place])   . 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 CAD in native artery 11/15/2017  . Abnormal cardiac CT angiography   . RLQ abdominal pain 07/07/2015  . Primary osteoarthritis of left knee 06/17/2014  . 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 Preoperative cardiovascular examination 04/19/2014  . Hyperlipidemia LDL goal <70 04/19/2014  . AP (abdominal pain) 01/16/2014  . History of diverticulitis 01/16/2014  . GERD 01/14/2009  . ULCER-GASTRIC 01/14/2009  . GASTRITIS 11/14/2008  . DIVERTICULITIS OF COLON 11/13/2008  . BLOOD IN STOOL  11/13/2008  . ABDOMINAL PAIN 11/13/2008  . PERSONAL HISTORY OF COLONIC POLYPS 11/13/2008   Past Medical History:  Diagnosis Date  . Anxiety    takes Xanax daily as needed  . Arthritis   . Cataract    removed both eyes  . Depression    takes Celexa daily  . Diverticulitis   . Diverticulosis   . Enlarged prostate    sightly  . Esophagitis   . Gastric ulcer   . GERD (gastroesophageal reflux disease)    takes Protonix daily  . Hx of adenomatous colonic polyps    benign  . Hyperlipidemia    takes Zocor daily  . Insomnia    takes Ambien nightly  . Joint pain   . Joint swelling     Past Surgical History:  Procedure Laterality Date  . A-FLUTTER ABLATION N/A 01/11/2019   Procedure: A-FLUTTER ABLATION;  Surgeon: Evans Lance, MD;  Location: [facility];  Service: Cardiovascular;  Laterality: N/A;  . CARDIAC CATHETERIZATION    . CARDIOVERSION N/A 11/20/2018   Procedure: CARDIOVERSION;  Surgeon: Troy Sine, MD;  Location: [facility];  Service: Cardiovascular;  Laterality: N/A;  . cartliage removed from nose  38yrs ago  . cataract surgery Bilateral   . CERVICAL SPINE SURGERY    . COLONOSCOPY    . CORONARY ARTERY BYPASS GRAFT N/A 11/16/2017   Procedure: CORONARY ARTERY BYPASS GRAFTING (CABG) x4. LEFT ENDOSCOPIC SAPHENOUS VEIN HARVEST AND MAMMARY ARTERY TAKE DOWN. LIMA TO LAD, SVG TO PDA, SVG TO DISTAL CIRC & OMI.;  Surgeon: [name], MD;  Location: [facility];  Service: Open Heart Surgery;  Laterality: N/A;  . EYE SURGERY    . FOREIGN BODY REMOVAL ESOPHAGEAL    . KNEE SURGERY Left    couple of times  . LEFT HEART CATH AND CORONARY ANGIOGRAPHY N/A 11/15/2017   Procedure: LEFT HEART CATH AND CORONARY ANGIOGRAPHY;  Surgeon: Troy Sine, MD;  Location: [place] [facility];  Service: Cardiovascular;  Laterality: N/A;  . NASAL SINUS SURGERY    . POLYPECTOMY    . TEE WITHOUT CARDIOVERSION N/A 11/16/2017   Procedure: TRANSESOPHAGEAL ECHOCARDIOGRAM (TEE);  Surgeon:  [name], MD;  Location: [facility];  Service: Open Heart Surgery;  Laterality: N/A;  . TONSILLECTOMY    . TOTAL KNEE ARTHROPLASTY Left 06/17/2014   Procedure: TOTAL KNEE ARTHROPLASTY;  Surgeon: Garald Balding, MD;  Location: [facility];  Service: Orthopedics;  Laterality: Left;    No current facility-administered medications for this encounter.   Current Outpatient Medications  Medication Sig Dispense Refill Last Dose  . acetaminophen (TYLENOL) 500 MG tablet Take 1,000 mg by mouth every 6 (six) hours as needed for moderate pain or headache.     Marland Kitchen acyclovir (ZOVIRAX) 400 MG tablet Take 400 mg by mouth 2 (two) times daily.     Marland Kitchen aspirin EC 81 MG tablet Take 1 tablet (81 mg total) by mouth daily. 90 tablet 3   . citalopram (CELEXA) 20 MG tablet Take 20 mg by mouth daily.     Marland Kitchen ELIQUIS 5 MG TABS tablet TAKE 1 TABLET(5 MG) BY MOUTH TWICE DAILY (Patient taking differently: Take 5 mg by mouth 2 (two) times daily.) 180 tablet 1   . ferrous sulfate 325 (65 FE) MG tablet Take 325 mg by mouth daily.     Marland Kitchen levothyroxine (SYNTHROID) 150 MCG tablet TAKE 1 TABLET(150 MCG) BY MOUTH DAILY (Patient taking differently: Take 150 mcg by mouth daily.) 90 tablet 1   . loratadine (CLARITIN) 10 MG tablet Take 10 mg by mouth daily as needed for allergies.     . metoprolol tartrate (LOPRESSOR) 50 MG tablet Take 1 tablet (50 mg total) by mouth 2 (two) times daily. 180 tablet 3   . Multiple Vitamins-Minerals (PRESERVISION AREDS 2 PO) Take 1 tablet by mouth 2 (two) times daily.     . nitroGLYCERIN (NITROSTAT) 0.4 MG SL tablet DISSOLVE 1 TABLET UNDER THE TONGUE EVERY 5 MINUTES AS NEEDED FOR CHEST PAIN 25 tablet 2   . oxymetazoline (AFRIN) 0.05 % nasal spray Place 1 spray into both nostrils 2 (two) times daily as needed for congestion.     . pantoprazole (PROTONIX) 20 MG tablet TAKE 2 TABLETS(40 MG) BY MOUTH DAILY (Patient taking differently: Take 40 mg by mouth at bedtime.) 180 tablet 1   . PRESCRIPTION MEDICATION Apply 1  application topically daily as needed (dark spots). FLUOROURACIL 5% + CALCIPOTRIENE 0.005%     . rosuvastatin (CRESTOR) 20 MG tablet Take 1 tablet (20 mg total) by mouth daily. 90 tablet 2   . traMADol (ULTRAM) 50 MG tablet Take 1 tablet (50 mg total) by mouth every 12 (twelve) hours as needed. (Patient taking differently: Take 50 mg by mouth every 12 (twelve) hours as needed for moderate pain.) 30 tablet 1   . VASCEPA 1 g capsule TAKE 2 CAPSULES(2 GRAMS) BY MOUTH TWICE DAILY (Patient taking differently: Take 2 g by mouth 2 (two) times daily.) 360 capsule 1   . zolpidem (AMBIEN) 10 MG tablet Take 10 mg by mouth at bedtime.     Marland Kitchen  HYDROcodone-acetaminophen (NORCO/VICODIN) 5-325 MG tablet Take 1 tablet by mouth every 6 (six) hours as needed for moderate pain. 30 tablet 0   . methylPREDNISolone (MEDROL DOSEPAK) 4 MG TBPK tablet Take as directed on package. (Patient not taking: No sig reported) 21 tablet 0 Completed Course at Unknown time   No Known Allergies  Social History   Tobacco Use  . Smoking status: Never Smoker  . Smokeless tobacco: Never Used  Substance Use Topics  . Alcohol use: Yes    Alcohol/week: 10.0 standard drinks    Types: 10 Standard drinks or equivalent per week    Comment: 2 drinks 5 nights per week    Family History  Problem Relation Age of Onset  . Colon cancer Paternal Grandfather        questionable  . Colon polyps Neg Hx   . Esophageal cancer Neg Hx   . Rectal cancer Neg Hx   . Stomach cancer Neg Hx      Review of Systems  Musculoskeletal: Positive for gait problem.  All other systems reviewed and are negative.   Objective:  Physical Exam Vitals reviewed.  Constitutional:      Appearance: Normal appearance.  Eyes:     Extraocular Movements: Extraocular movements intact.     Pupils: Pupils are equal, round, and reactive to light.  Cardiovascular:     Rate and Rhythm: Normal rate.     Pulses: Normal pulses.  Pulmonary:     Effort: Pulmonary effort is  normal.     Breath sounds: Normal breath sounds.  Abdominal:     Palpations: Abdomen is soft.  Musculoskeletal:     Cervical back: Normal range of motion.     Right hip: Tenderness and bony tenderness present. Decreased range of motion. Decreased strength.  Neurological:     Mental Status: He is alert and oriented to person, place, and time.  Psychiatric:        Behavior: Behavior normal.     Vital signs in last 24 hours:    Labs:   Estimated body mass index is 28.77 kg/m as calculated from the following:   Height as of 04/14/20: 5' 8.5" (1.74 m).   Weight as of 04/14/20: 87.1 kg.   Imaging Review Plain radiographs demonstrate avascular necrosis of the right hip     Assessment/Plan:  End stage avascular necrosis, left hip(s)  The patient history, physical examination, clinical judgement of the provider and imaging studies are consistent with end stage AVN of the left hip(s) and total hip arthroplasty is deemed medically necessary. The treatment options including medical management, injection therapy, arthroscopy and arthroplasty were discussed at length. The risks and benefits of total hip arthroplasty were presented and reviewed. The risks due to aseptic loosening, infection, stiffness, dislocation/subluxation,  thromboembolic complications and other imponderables were discussed.  The patient acknowledged the explanation, agreed to proceed with the plan and consent was signed. Patient is being admitted for inpatient treatment for surgery, pain control, PT, OT, prophylactic antibiotics, VTE prophylaxis, progressive ambulation and ADL's and discharge planning.The patient is planning to be discharged home with home health services

## 2020-04-17 ENCOUNTER — Ambulatory Visit (HOSPITAL_COMMUNITY): Payer: PPO

## 2020-04-17 ENCOUNTER — Ambulatory Visit (HOSPITAL_COMMUNITY): Payer: PPO | Admitting: Certified Registered"

## 2020-04-17 ENCOUNTER — Encounter (HOSPITAL_COMMUNITY)
Admission: RE | Disposition: A | Discharge status: Home / Self Care | Payer: Self-pay | Source: Ambulatory Visit | Attending: Orthopaedic Surgery

## 2020-04-17 ENCOUNTER — Ambulatory Visit (HOSPITAL_COMMUNITY): Payer: PPO | Admitting: Physician Assistant

## 2020-04-17 ENCOUNTER — Other Ambulatory Visit: Payer: Self-pay

## 2020-04-17 ENCOUNTER — Encounter (HOSPITAL_COMMUNITY): Payer: Self-pay | Admitting: Orthopaedic Surgery

## 2020-04-17 ENCOUNTER — Observation Stay (HOSPITAL_COMMUNITY)
Admission: RE | Admit: 2020-04-17 | Discharge: 2020-04-19 | Disposition: A | Discharge status: Home / Self Care | Payer: PPO | Source: Ambulatory Visit | Attending: Orthopaedic Surgery | Admitting: Orthopaedic Surgery

## 2020-04-17 ENCOUNTER — Observation Stay (HOSPITAL_COMMUNITY): Payer: PPO

## 2020-04-17 DIAGNOSIS — M87051 Idiopathic aseptic necrosis of right femur: Secondary | ICD-10-CM | POA: Diagnosis present

## 2020-04-17 DIAGNOSIS — Z7982 Long term (current) use of aspirin: Secondary | ICD-10-CM | POA: Insufficient documentation

## 2020-04-17 DIAGNOSIS — M25551 Pain in right hip: Secondary | ICD-10-CM | POA: Diagnosis present

## 2020-04-17 DIAGNOSIS — M1611 Unilateral primary osteoarthritis, right hip: Principal | ICD-10-CM | POA: Insufficient documentation

## 2020-04-17 DIAGNOSIS — F418 Other specified anxiety disorders: Secondary | ICD-10-CM | POA: Diagnosis not present

## 2020-04-17 DIAGNOSIS — Z96641 Presence of right artificial hip joint: Secondary | ICD-10-CM

## 2020-04-17 DIAGNOSIS — Z419 Encounter for procedure for purposes other than remedying health state, unspecified: Secondary | ICD-10-CM

## 2020-04-17 DIAGNOSIS — I483 Typical atrial flutter: Secondary | ICD-10-CM | POA: Diagnosis not present

## 2020-04-17 DIAGNOSIS — Z471 Aftercare following joint replacement surgery: Secondary | ICD-10-CM | POA: Diagnosis not present

## 2020-04-17 HISTORY — PX: TOTAL HIP ARTHROPLASTY: SHX124

## 2020-04-17 LAB — TYPE AND SCREEN
ABO/RH(D): A NEG
Antibody Screen: NEGATIVE

## 2020-04-17 SURGERY — ARTHROPLASTY, HIP, TOTAL, ANTERIOR APPROACH
Anesthesia: Spinal | Site: Hip | Laterality: Right

## 2020-04-17 MED ORDER — OXYCODONE HCL 5 MG PO TABS
5.0000 mg | ORAL_TABLET | ORAL | Status: DC | PRN
  Administered 2020-04-17 – 2020-04-19 (×4): 10 mg via ORAL
  Filled 2020-04-17 (×5): qty 2

## 2020-04-17 MED ORDER — DEXAMETHASONE SODIUM PHOSPHATE 10 MG/ML IJ SOLN
INTRAMUSCULAR | Status: DC | PRN
  Administered 2020-04-17: 10 mg via INTRAVENOUS

## 2020-04-17 MED ORDER — 0.9 % SODIUM CHLORIDE (POUR BTL) OPTIME
TOPICAL | Status: DC | PRN
  Administered 2020-04-17: 1000 mL

## 2020-04-17 MED ORDER — METOPROLOL TARTRATE 50 MG PO TABS
50.0000 mg | ORAL_TABLET | Freq: Two times a day (BID) | ORAL | Status: DC
  Administered 2020-04-17 – 2020-04-19 (×4): 50 mg via ORAL
  Filled 2020-04-17 (×4): qty 1

## 2020-04-17 MED ORDER — DEXAMETHASONE SODIUM PHOSPHATE 10 MG/ML IJ SOLN
INTRAMUSCULAR | Status: AC
  Filled 2020-04-17: qty 1

## 2020-04-17 MED ORDER — CEFAZOLIN SODIUM-DEXTROSE 2-4 GM/100ML-% IV SOLN
2.0000 g | INTRAVENOUS | Status: AC
  Administered 2020-04-17: 2 g via INTRAVENOUS
  Filled 2020-04-17: qty 100

## 2020-04-17 MED ORDER — FERROUS SULFATE 325 (65 FE) MG PO TABS
325.0000 mg | ORAL_TABLET | Freq: Every day | ORAL | Status: DC
  Administered 2020-04-18 – 2020-04-19 (×2): 325 mg via ORAL
  Filled 2020-04-17 (×2): qty 1

## 2020-04-17 MED ORDER — ACYCLOVIR 400 MG PO TABS
400.0000 mg | ORAL_TABLET | Freq: Two times a day (BID) | ORAL | Status: DC
  Administered 2020-04-17 – 2020-04-19 (×4): 400 mg via ORAL
  Filled 2020-04-17 (×4): qty 1

## 2020-04-17 MED ORDER — POLYETHYLENE GLYCOL 3350 17 G PO PACK: 17.0000 g | PACK | Freq: Every day | ORAL | Status: DC | PRN

## 2020-04-17 MED ORDER — CITALOPRAM HYDROBROMIDE 20 MG PO TABS
20.0000 mg | ORAL_TABLET | Freq: Every day | ORAL | Status: DC
  Administered 2020-04-18 – 2020-04-19 (×2): 20 mg via ORAL
  Filled 2020-04-17 (×2): qty 1

## 2020-04-17 MED ORDER — HYDROMORPHONE HCL 1 MG/ML IJ SOLN: 0.5000 mg | INTRAMUSCULAR | Status: DC | PRN

## 2020-04-17 MED ORDER — SODIUM CHLORIDE 0.9 % IV SOLN: INTRAVENOUS | Status: DC

## 2020-04-17 MED ORDER — METHOCARBAMOL 1000 MG/10ML IJ SOLN
500.0000 mg | Freq: Four times a day (QID) | INTRAVENOUS | Status: DC | PRN
  Filled 2020-04-17: qty 5

## 2020-04-17 MED ORDER — POVIDONE-IODINE 10 % EX SWAB
2.0000 "application " | Freq: Once | CUTANEOUS | Status: AC
  Administered 2020-04-17: 2 via TOPICAL

## 2020-04-17 MED ORDER — PHENYLEPHRINE 40 MCG/ML (10ML) SYRINGE FOR IV PUSH (FOR BLOOD PRESSURE SUPPORT)
PREFILLED_SYRINGE | INTRAVENOUS | Status: AC
  Filled 2020-04-17: qty 10

## 2020-04-17 MED ORDER — PROPOFOL 10 MG/ML IV BOLUS
INTRAVENOUS | Status: DC | PRN
  Administered 2020-04-17 (×2): 40 mg via INTRAVENOUS

## 2020-04-17 MED ORDER — METHOCARBAMOL 500 MG PO TABS
500.0000 mg | ORAL_TABLET | Freq: Four times a day (QID) | ORAL | Status: DC | PRN
  Administered 2020-04-17 – 2020-04-19 (×4): 500 mg via ORAL
  Filled 2020-04-17 (×4): qty 1

## 2020-04-17 MED ORDER — LACTATED RINGERS IV SOLN: INTRAVENOUS | Status: DC

## 2020-04-17 MED ORDER — ACETAMINOPHEN 325 MG PO TABS: 325.0000 mg | ORAL_TABLET | Freq: Four times a day (QID) | ORAL | Status: DC | PRN

## 2020-04-17 MED ORDER — ZOLPIDEM TARTRATE 5 MG PO TABS
5.0000 mg | ORAL_TABLET | Freq: Every evening | ORAL | Status: DC | PRN
  Administered 2020-04-18 (×2): 5 mg via ORAL
  Filled 2020-04-17 (×2): qty 1

## 2020-04-17 MED ORDER — EPHEDRINE SULFATE-NACL 50-0.9 MG/10ML-% IV SOSY
PREFILLED_SYRINGE | INTRAVENOUS | Status: DC | PRN
  Administered 2020-04-17 (×4): 10 mg via INTRAVENOUS

## 2020-04-17 MED ORDER — ONDANSETRON HCL 4 MG PO TABS: 4.0000 mg | ORAL_TABLET | Freq: Four times a day (QID) | ORAL | Status: DC | PRN

## 2020-04-17 MED ORDER — PHENYLEPHRINE 40 MCG/ML (10ML) SYRINGE FOR IV PUSH (FOR BLOOD PRESSURE SUPPORT)
PREFILLED_SYRINGE | INTRAVENOUS | Status: DC | PRN
  Administered 2020-04-17 (×3): 80 ug via INTRAVENOUS

## 2020-04-17 MED ORDER — BUPIVACAINE IN DEXTROSE 0.75-8.25 % IT SOLN
INTRATHECAL | Status: DC | PRN
  Administered 2020-04-17: 1.6 mL via INTRATHECAL

## 2020-04-17 MED ORDER — LORATADINE 10 MG PO TABS: 10.0000 mg | ORAL_TABLET | Freq: Every day | ORAL | Status: DC | PRN

## 2020-04-17 MED ORDER — FENTANYL CITRATE (PF) 100 MCG/2ML IJ SOLN
INTRAMUSCULAR | Status: DC | PRN
  Administered 2020-04-17 (×2): 50 ug via INTRAVENOUS

## 2020-04-17 MED ORDER — ONDANSETRON HCL 4 MG/2ML IJ SOLN
INTRAMUSCULAR | Status: AC
  Filled 2020-04-17: qty 2

## 2020-04-17 MED ORDER — PROPOFOL 500 MG/50ML IV EMUL
INTRAVENOUS | Status: DC | PRN
  Administered 2020-04-17: 100 ug/kg/min via INTRAVENOUS

## 2020-04-17 MED ORDER — ASPIRIN EC 81 MG PO TBEC
81.0000 mg | DELAYED_RELEASE_TABLET | Freq: Every day | ORAL | Status: DC
  Administered 2020-04-18 – 2020-04-19 (×2): 81 mg via ORAL
  Filled 2020-04-17 (×2): qty 1

## 2020-04-17 MED ORDER — MENTHOL 3 MG MT LOZG: 1.0000 | LOZENGE | OROMUCOSAL | Status: DC | PRN

## 2020-04-17 MED ORDER — LEVOTHYROXINE SODIUM 75 MCG PO TABS
150.0000 ug | ORAL_TABLET | Freq: Every day | ORAL | Status: DC
  Administered 2020-04-18 – 2020-04-19 (×2): 150 ug via ORAL
  Filled 2020-04-17 (×2): qty 2

## 2020-04-17 MED ORDER — CHLORHEXIDINE GLUCONATE 0.12 % MT SOLN
15.0000 mL | Freq: Once | OROMUCOSAL | Status: AC
  Administered 2020-04-17: 15 mL via OROMUCOSAL

## 2020-04-17 MED ORDER — ICOSAPENT ETHYL 1 G PO CAPS
2.0000 g | ORAL_CAPSULE | Freq: Two times a day (BID) | ORAL | Status: DC
  Administered 2020-04-18 – 2020-04-19 (×3): 2 g via ORAL
  Filled 2020-04-17 (×3): qty 2

## 2020-04-17 MED ORDER — LIDOCAINE 2% (20 MG/ML) 5 ML SYRINGE
INTRAMUSCULAR | Status: AC
  Filled 2020-04-17: qty 5

## 2020-04-17 MED ORDER — ONDANSETRON HCL 4 MG/2ML IJ SOLN
INTRAMUSCULAR | Status: DC | PRN
  Administered 2020-04-17: 4 mg via INTRAVENOUS

## 2020-04-17 MED ORDER — FENTANYL CITRATE (PF) 100 MCG/2ML IJ SOLN
INTRAMUSCULAR | Status: AC
  Filled 2020-04-17: qty 2

## 2020-04-17 MED ORDER — ROSUVASTATIN CALCIUM 20 MG PO TABS
20.0000 mg | ORAL_TABLET | Freq: Every day | ORAL | Status: DC
  Administered 2020-04-18 – 2020-04-19 (×2): 20 mg via ORAL
  Filled 2020-04-17 (×2): qty 1

## 2020-04-17 MED ORDER — ORAL CARE MOUTH RINSE: 15.0000 mL | Freq: Once | OROMUCOSAL | Status: AC

## 2020-04-17 MED ORDER — ONDANSETRON HCL 4 MG/2ML IJ SOLN: 4.0000 mg | Freq: Four times a day (QID) | INTRAMUSCULAR | Status: DC | PRN

## 2020-04-17 MED ORDER — PANTOPRAZOLE SODIUM 40 MG PO TBEC
40.0000 mg | DELAYED_RELEASE_TABLET | Freq: Every day | ORAL | Status: DC
  Administered 2020-04-18 – 2020-04-19 (×2): 40 mg via ORAL
  Filled 2020-04-17: qty 1

## 2020-04-17 MED ORDER — METOCLOPRAMIDE HCL 5 MG PO TABS: 5.0000 mg | ORAL_TABLET | Freq: Three times a day (TID) | ORAL | Status: DC | PRN

## 2020-04-17 MED ORDER — PROMETHAZINE HCL 25 MG/ML IJ SOLN: 6.2500 mg | INTRAMUSCULAR | Status: DC | PRN

## 2020-04-17 MED ORDER — TRANEXAMIC ACID-NACL 1000-0.7 MG/100ML-% IV SOLN
1000.0000 mg | INTRAVENOUS | Status: AC
  Administered 2020-04-17: 1000 mg via INTRAVENOUS
  Filled 2020-04-17: qty 100

## 2020-04-17 MED ORDER — PROPOFOL 500 MG/50ML IV EMUL
INTRAVENOUS | Status: AC
  Filled 2020-04-17: qty 50

## 2020-04-17 MED ORDER — LIDOCAINE 2% (20 MG/ML) 5 ML SYRINGE
INTRAMUSCULAR | Status: DC | PRN
  Administered 2020-04-17: 80 mg via INTRAVENOUS

## 2020-04-17 MED ORDER — CEFAZOLIN SODIUM-DEXTROSE 1-4 GM/50ML-% IV SOLN
1.0000 g | Freq: Four times a day (QID) | INTRAVENOUS | Status: AC
  Administered 2020-04-17 – 2020-04-18 (×2): 1 g via INTRAVENOUS
  Filled 2020-04-17 (×2): qty 50

## 2020-04-17 MED ORDER — METOCLOPRAMIDE HCL 5 MG/ML IJ SOLN
5.0000 mg | Freq: Three times a day (TID) | INTRAMUSCULAR | Status: DC | PRN
Start: 2020-04-17 — End: 2020-04-19

## 2020-04-17 MED ORDER — APIXABAN 5 MG PO TABS
5.0000 mg | ORAL_TABLET | Freq: Two times a day (BID) | ORAL | Status: DC
  Administered 2020-04-18 – 2020-04-19 (×3): 5 mg via ORAL
  Filled 2020-04-17 (×3): qty 1

## 2020-04-17 MED ORDER — PHENOL 1.4 % MT LIQD: 1.0000 | OROMUCOSAL | Status: DC | PRN

## 2020-04-17 MED ORDER — OXYCODONE HCL 5 MG PO TABS
10.0000 mg | ORAL_TABLET | ORAL | Status: DC | PRN
Start: 2020-04-17 — End: 2020-04-19
  Administered 2020-04-17: 10 mg via ORAL
  Administered 2020-04-18 – 2020-04-19 (×2): 15 mg via ORAL
  Filled 2020-04-17: qty 3
  Filled 2020-04-17: qty 2
  Filled 2020-04-17: qty 3

## 2020-04-17 MED ORDER — DOCUSATE SODIUM 100 MG PO CAPS
100.0000 mg | ORAL_CAPSULE | Freq: Two times a day (BID) | ORAL | Status: DC
  Administered 2020-04-17 – 2020-04-19 (×4): 100 mg via ORAL
  Filled 2020-04-17 (×4): qty 1

## 2020-04-17 MED ORDER — HYDROMORPHONE HCL 1 MG/ML IJ SOLN
0.2500 mg | INTRAMUSCULAR | Status: DC | PRN
Start: 2020-04-17 — End: 2020-04-17

## 2020-04-17 MED ORDER — DIPHENHYDRAMINE HCL 12.5 MG/5ML PO ELIX: 12.5000 mg | ORAL_SOLUTION | ORAL | Status: DC | PRN

## 2020-04-17 MED ORDER — ALUM & MAG HYDROXIDE-SIMETH 200-200-20 MG/5ML PO SUSP
30.0000 mL | ORAL | Status: DC | PRN
Start: 2020-04-17 — End: 2020-04-19

## 2020-04-17 MED ORDER — EPHEDRINE 5 MG/ML INJ
INTRAVENOUS | Status: AC
  Filled 2020-04-17: qty 10

## 2020-04-17 SURGICAL SUPPLY — 44 items
ACETAB CUP W/GRIPTION 54 (Plate) ×2 IMPLANT
APL SKNCLS STERI-STRIP NONHPOA (GAUZE/BANDAGES/DRESSINGS)
BAG SPEC THK2 15X12 ZIP CLS (MISCELLANEOUS)
BAG ZIPLOCK 12X15 (MISCELLANEOUS) IMPLANT
BENZOIN TINCTURE PRP APPL 2/3 (GAUZE/BANDAGES/DRESSINGS) IMPLANT
BLADE SAW SGTL 18X1.27X75 (BLADE) ×2 IMPLANT
COVER PERINEAL POST (MISCELLANEOUS) ×2 IMPLANT
COVER SURGICAL LIGHT HANDLE (MISCELLANEOUS) ×2 IMPLANT
COVER WAND RF STERILE (DRAPES) ×1 IMPLANT
CUP ACETAB W/GRIPTION 54 (Plate) IMPLANT
DRAPE STERI IOBAN 125X83 (DRAPES) ×2 IMPLANT
DRAPE U-SHAPE 47X51 STRL (DRAPES) ×4 IMPLANT
DRSG AQUACEL AG ADV 3.5X10 (GAUZE/BANDAGES/DRESSINGS) ×2 IMPLANT
DURAPREP 26ML APPLICATOR (WOUND CARE) ×2 IMPLANT
ELECT REM PT RETURN 15FT ADLT (MISCELLANEOUS) ×2 IMPLANT
GAUZE XEROFORM 1X8 LF (GAUZE/BANDAGES/DRESSINGS) ×2 IMPLANT
GLOVE SRG 8 PF TXTR STRL LF DI (GLOVE) ×2 IMPLANT
GLOVE SURG ENC MOIS LTX SZ7.5 (GLOVE) ×2 IMPLANT
GLOVE SURG LTX SZ8 (GLOVE) ×2 IMPLANT
GLOVE SURG UNDER POLY LF SZ8 (GLOVE) ×4
GOWN STRL REUS W/TWL XL LVL3 (GOWN DISPOSABLE) ×4 IMPLANT
HANDPIECE INTERPULSE COAX TIP (DISPOSABLE) ×2
HEAD M SROM 36MM 2 (Hips) IMPLANT
HOLDER FOLEY CATH W/STRAP (MISCELLANEOUS) ×2 IMPLANT
KIT TURNOVER KIT A (KITS) ×2 IMPLANT
LINER NEUTRAL 54X36MM PLUS 4 (Hips) ×1 IMPLANT
PACK ANTERIOR HIP CUSTOM (KITS) ×2 IMPLANT
PENCIL SMOKE EVACUATOR (MISCELLANEOUS) ×1 IMPLANT
PIN SECTOR W/GRIP ACE CUP 52MM (Hips) ×1 IMPLANT
SCREW 6.5MMX25MM (Screw) ×1 IMPLANT
SET HNDPC FAN SPRY TIP SCT (DISPOSABLE) ×1 IMPLANT
SROM M HEAD 36MM 2 (Hips) ×2 IMPLANT
STAPLER VISISTAT 35W (STAPLE) ×1 IMPLANT
STEM CORAIL HC SZ14 135 (Stem) ×1 IMPLANT
STRIP CLOSURE SKIN 1/2X4 (GAUZE/BANDAGES/DRESSINGS) IMPLANT
SUT ETHIBOND NAB CT1 #1 30IN (SUTURE) ×2 IMPLANT
SUT ETHILON 2 0 PS N (SUTURE) IMPLANT
SUT MNCRL AB 4-0 PS2 18 (SUTURE) IMPLANT
SUT VIC AB 0 CT1 36 (SUTURE) ×2 IMPLANT
SUT VIC AB 1 CT1 36 (SUTURE) ×2 IMPLANT
SUT VIC AB 2-0 CT1 27 (SUTURE) ×4
SUT VIC AB 2-0 CT1 TAPERPNT 27 (SUTURE) ×2 IMPLANT
TRAY FOLEY MTR SLVR 16FR STAT (SET/KITS/TRAYS/PACK) ×1 IMPLANT
YANKAUER SUCT BULB TIP NO VENT (SUCTIONS) ×1 IMPLANT

## 2020-04-17 NOTE — Op Note (Signed)
NAMEHERSHEL, Carl [name] MEDICAL RECORD NO: [id number] ACCOUNT NO: [id number] DATE OF BIRTH: [birth year] FACILITY: [facility] LOCATION: [facility] PHYSICIAN: Lind Guest. Ninfa Linden, MD  Operative Report   DATE OF PROCEDURE: 04/17/2020  PREOPERATIVE DIAGNOSIS:  Osteoarthritis with superimposed osteonecrosis/AVN, right hip.  POSTOPERATIVE DIAGNOSIS:  Osteoarthritis with superimposed osteonecrosis/AVN, right hip.  PROCEDURE:  Right total hip arthroplasty, direct anterior approach.  IMPLANTS:  DePuy sector Gription acetabular component, size 54, size 36+4 neutral polyethylene liner, a single screw in the acetabulum, size 14 Corail femoral component with high offset, size 36-2 metal hip ball.  SURGEON:  Lind Guest. Ninfa Linden, MD  ASSISTANT:  Erskine Emery, PA-C.  ANESTHESIA:  Spinal.  ANTIBIOTICS:  2 g IV Ancef.  ESTIMATED BLOOD LOSS:  818 mL  COMPLICATIONS:  None.  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  He had likely some osteoarthritis of that hip, but then he developed a rapid pain over the  last several months, which was rapidly progressing.  An MRI was obtained of that right hip showing subchondral edema with subchondral fracture and cartilage loss consistent with avascular necrosis.  Given the severity of his pain and these findings, I  recommended he undergo total hip arthroplasty.  He is walking with a significant limp and does wish to proceed with surgery given the pain that he is having and the decreased mobility and detrimental affect this has had on his quality of life and his  activities of daily living.  We had a long and thorough discussion about the risks and benefits of surgery including the risk of acute blood loss anemia, nerve or vessel injury, fracture, infection, DVT, implant failure, dislocation and skin and soft  tissue issues.  We talked about her goals  being to decrease pain, improve mobility and overall improve quality of life.  DESCRIPTION OF PROCEDURE:  After informed consent was obtained, appropriate right hip was marked.  He was brought to the operating room and sat up on a stretcher where spinal anesthesia was obtained.  He was laid in supine position on a stretcher.  Foley  catheter was placed and traction boots were placed on both his feet.  Next, he was placed supine on the Hana fracture table, the perineal post in place and both legs in line skeletal traction device and no traction applied.  His right operative hip was  prepped and draped with DuraPrep and sterile drapes.  A timeout was called and he was identified correct patient, correct right hip.  I then made an incision just inferior and posterior to the anterior superior iliac spine and carried this obliquely down  the leg.  We dissected down tensor fascia lata muscle.  Tensor fascia was then divided longitudinally to proceed with direct anterior approach to the hip.  We identified and cauterized circumflex vessels.  We then identified the hip capsule and opened  the hip capsule in L-type format finding moderate joint effusion.  We placed a Cobra retractor on the medial and lateral femoral neck and made our femoral neck cut with an oscillating saw proximal to the lesser trochanter and completed this with an  osteotome.  We placed a corkscrew guide in the femoral head and removed the femoral head in its entirety and found cartilage, which was flaking off consistent with avascular necrosis.  There is some osteoarthritis as well.  I then placed a bent Hohmann  to the medial  acetabular rim and removed the remnants of acetabular labrum and other debris.  We then began reaming in a stepwise increments from a size 44 reamer going up to a size 51 with all reamers under direct visualization.  Last reamer was placed  under direct fluoroscopy to obtain our depth in reaming, our inclination and  anteversion.  I then placed a 52 acetabular component, but we just could not get it to grab.  I reamed up one size to a 52 reamer and I still could not get that to fit, so we  decided to ream one more size to 53 and then at that point, we were able to place a size 54 acetabular component, which had a good bite to it.  We still placed a single screw.  The last reamer again was placed under direct fluoroscopy before I placed the  final acetabular component in, so I could obtain my depth of reaming, our inclination and anteversion.  Again, I placed the 54 acetabular component with single screw in it with a 36+4 polyethylene liner.  I then medialized it some.  We then went to the  femur.  Next, the leg was externally rotated to 120 degrees and extended and adducted.  We were able to place a Mueller retractor medially and a Hohmann retractor behind the greater trochanter.  We used a box cutting osteotome to enter femoral canal and  a rongeur to lateralize, then began broaching using the Corail broaching system from a size 8 going all the way up to a size 14.  With a size 14 in place, we trialed a high offset femoral neck based on this anatomy, we went with a 36-2 hip ball due to  higher neck cut.  We reduced this and acetabulum and we were pleased with leg length, offset, range of motion and stability assessed radiographically and mechanically.  We then dislocated the hip, removed the trial components.  I placed the real Corail  femoral component with high offset of size 14 and the real 36-2 metal hip ball and again reduced this in the acetabulum and we appreciated the stability.  We then irrigated the soft tissue with normal saline solution using pulsatile lavage.  We were able  to close the joint capsule with interrupted #1 Ethibond suture followed by #1 Vicryl to close the tensor fascia.  0 Vicryl to close the deep tissue and 2-0 Vicryl to close the subcutaneous tissue.  The skin was reapproximated with  staples.  An Aquacel  dressing was applied.  He was taken off Hana table and taken to recovery room in stable condition with all final counts being correct and no complications noted.  Of note, Benita Stabile, PA-C assisted during the entire case and assistance was crucial for  facilitating all aspects of this case.   PUS D: 04/17/2020 2:48:11 pm T: 04/17/2020 6:41:00 pm  JOB: [id number]/ [id number]

## 2020-04-17 NOTE — Anesthesia Procedure Notes (Signed)
Spinal  Patient location during procedure: OR End time: 04/17/2020 1:34 PM Staffing Performed: anesthesiologist and resident/CRNA  Resident/CRNA: Sherlon Nied D, CRNA Preanesthetic Checklist Completed: patient identified, IV checked, site marked, risks and benefits discussed, surgical consent, monitors and equipment checked, pre-op evaluation and timeout performed Spinal Block Patient position: sitting Prep: DuraPrep Patient monitoring: heart rate, continuous pulse ox and blood pressure Approach: midline Location: L2-3 Injection technique: single-shot Needle Needle type: Sprotte  Needle gauge: 24 G Needle length: 9 cm Assessment Sensory level: T6 Additional Notes Expiration date of kit checked and confirmed. Patient tolerated procedure well, without complications.

## 2020-04-17 NOTE — Brief Op Note (Signed)
04/17/2020  2:51 PM  PATIENT:  Carl [name]  75 y.o. male  PRE-OPERATIVE DIAGNOSIS:  right hip avascular necrosis with femoral head collapse  POST-OPERATIVE DIAGNOSIS:  right hip avascular necrosis with femoral head collapse  PROCEDURE:  Procedure(s): RIGHT TOTAL HIP ARTHROPLASTY ANTERIOR APPROACH (Right)  SURGEON:  Surgeon(s) and Role:    Mcarthur Rossetti, MD - Primary  PHYSICIAN ASSISTANT:  Benita Stabile, PA-C  ANESTHESIA:   spinal  EBL:  150 mL   COUNTS:  YES  DICTATION: .Other Dictation: Dictation Number [id number]  PLAN OF CARE: Admit for overnight observation  PATIENT DISPOSITION:  PACU - hemodynamically stable.   Delay start of Pharmacological VTE agent (>24hrs) due to surgical blood loss or risk of bleeding: no

## 2020-04-17 NOTE — Evaluation (Addendum)
Physical Therapy Evaluation Patient Details Name: Carl [name] MRN: [id number] DOB: May 12, 1945 Today's Date: 04/17/2020   History of Present Illness  Patient is 75 y.o. male s/p Rt THA on 04/17/20 with PMH significant for HLD, GERD, OA, depression, anxiety, AVN of Rt hip, Lt TKA, CABG in 2019.  Clinical Impression  Pt is a 75yo male s/p Rt THA POD 0. Pt reports that he is modified independent with mobility at baseline. Pt required MIN assist for power up to stand with cues for safe hand placement. Pt required MOD assist +2 for stability with stand pivot to recliner 2/2 increased muscle weakness, suspect spinal block. PT reviewed therapeutic intervention for promotion of DVT prevention, pt demonstrated understanding. Pt will have assistance from his sister upon discharge. Pt will benefit from skilled PT to increase independence and safety with mobility. Acute therapy to follow up during stay to progress functional mobility as able to ensure safe dischare home.       Follow Up Recommendations Home health PT;Follow surgeon's recommendation for DC plan and follow-up therapies    Equipment Recommendations  Rolling walker with 5" wheels    Recommendations for Other Services       Precautions / Restrictions Precautions Precautions: Fall Restrictions Weight Bearing Restrictions: No RLE Weight Bearing: Weight bearing as tolerated      Mobility  Bed Mobility Overal bed mobility: Needs Assistance Bed Mobility: Supine to Sit     Supine to sit: Supervision;HOB elevated     General bed mobility comments: use of B UEs to scoot to EOB with supervision for safety    Transfers Overall transfer level: Needs assistance Equipment used: Rolling walker (2 wheeled) Transfers: Sit to/from [facility] Sit to Stand: Min assist Stand pivot transfers: Mod assist;+2 physical assistance;+2 safety/equipment       General transfer comment: MIN assist for stability and power up to stand  with cues for safe hand placement. Pt demonstrating lateral lean when WB Rt>Lt LE with standing marches and use of B UEs on RW. Pt required MOD assist +2 for stand pivot to recliner 2/2 increased muscle weakness, suspect spinal block.  Ambulation/Gait                Stairs            Wheelchair Mobility    Modified Rankin (Stroke Patients Only)       Balance Overall balance assessment: Needs assistance Sitting-balance support: Feet supported Sitting balance-Leahy Scale: Good     Standing balance support: Bilateral upper extremity supported;During functional activity Standing balance-Leahy Scale: Poor Standing balance comment: use of RW and assist from therapist to maintain standing balance                             Pertinent Vitals/Pain Pain Assessment: No/denies pain    Home Living Family/patient expects to be discharged to:: Private residence Living Arrangements: Alone Available Help at Discharge: Family Type of Home: Apartment Home Access: Level entry     Home Layout: One level Home Equipment: Cane - single point Additional Comments: pt will have assist from his sister at home    Prior Function Level of Independence: Independent with assistive device(s)               Hand Dominance   Dominant Hand: Right    Extremity/Trunk Assessment   Upper Extremity Assessment Upper Extremity Assessment: Overall WFL for tasks assessed    Lower Extremity  Assessment Lower Extremity Assessment: RLE deficits/detail RLE Deficits / Details: pt with good Rt quad set and 4-/5 B ankle dorsi/plantar flexion strength RLE Sensation: WNL RLE Coordination: WNL    Cervical / Trunk Assessment Cervical / Trunk Assessment: Normal  Communication   Communication: No difficulties  Cognition Arousal/Alertness: Awake/alert Behavior During Therapy: WFL for tasks assessed/performed Overall Cognitive Status: Within Functional Limits for tasks assessed                                         General Comments      Exercises Total Joint Exercises Ankle Circles/Pumps: AROM;Both;20 reps;Seated   Assessment/Plan    PT Assessment Patient needs continued PT services  PT Problem List Decreased strength;Decreased range of motion;Decreased activity tolerance;Decreased balance;Decreased mobility;Decreased knowledge of use of DME;Pain       PT Treatment Interventions DME instruction;Gait training;Functional mobility training;Therapeutic activities;Therapeutic exercise;Balance training;Patient/family education    PT Goals (Current goals can be found in the Care Plan section)  Acute Rehab PT Goals Patient Stated Goal: start golfing and get back to walking PT Goal Formulation: With patient Time For Goal Achievement: 04/24/20 Potential to Achieve Goals: Good    Frequency 7X/week   Barriers to discharge        Co-evaluation               AM-PAC PT "6 Clicks" Mobility  Outcome Measure Help needed turning from your back to your side while in a flat bed without using bedrails?: None Help needed moving from lying on your back to sitting on the side of a flat bed without using bedrails?: None Help needed moving to and from a bed to a chair (including a wheelchair)?: A Lot Help needed standing up from a chair using your arms (e.g., wheelchair or bedside chair)?: A Little Help needed to walk in hospital room?: Total Help needed climbing 3-5 steps with a railing? : Total 6 Click Score: 15    End of Session Equipment Utilized During Treatment: Gait belt Activity Tolerance: Patient tolerated treatment well;Treatment limited secondary to medical complications (Comment) Patient left: in chair;with chair alarm set;with call bell/phone within reach Nurse Communication: Mobility status (+2 in case of LE weakness from spinal block) PT Visit Diagnosis: Unsteadiness on feet (R26.81);Muscle weakness (generalized) (M62.81);Pain Pain -  Right/Left: Right Pain - part of body: Hip    Time: 1660-6004 PT Time Calculation (min) (ACUTE ONLY): 20 min   Charges:              Elna Breslow, SPT  Acute rehab    Elna Breslow 04/17/2020, 7:03 PM

## 2020-04-17 NOTE — Anesthesia Postprocedure Evaluation (Signed)
Anesthesia Post Note  Patient: Carl [name]  Procedure(s) Performed: RIGHT TOTAL HIP ARTHROPLASTY ANTERIOR APPROACH (Right Hip)     Patient location during evaluation: PACU Anesthesia Type: Spinal Level of consciousness: oriented and awake and alert Pain management: pain level controlled Vital Signs Assessment: post-procedure vital signs reviewed and stable Respiratory status: spontaneous breathing, respiratory function stable and patient connected to nasal cannula oxygen Cardiovascular status: blood pressure returned to baseline and stable Postop Assessment: no headache, no backache and no apparent nausea or vomiting Anesthetic complications: no   No complications documented.  Last Vitals:  Vitals:   04/17/20 1740 04/17/20 1940  BP: (!) 152/71 (!) 141/75  Pulse: 61 82  Resp: 18 18  Temp: 36.5 C 36.6 C  SpO2: 100% 98%    Last Pain:  Vitals:   04/17/20 1940  TempSrc: Oral  PainSc:                  Nolon Nations

## 2020-04-17 NOTE — Plan of Care (Signed)
  Problem: Education: Goal: Knowledge of the prescribed therapeutic regimen will improve Outcome: Progressing Goal: Understanding of discharge needs will improve Outcome: Progressing   Problem: Activity: Goal: Ability to avoid complications of mobility impairment will improve Outcome: Progressing Goal: Ability to tolerate increased activity will improve Outcome: Progressing   Problem: Clinical Measurements: Goal: Postoperative complications will be avoided or minimized Outcome: Progressing   Problem: Pain Management: Goal: Pain level will decrease with appropriate interventions Outcome: Progressing   Problem: Skin Integrity: Goal: Will show signs of wound healing Outcome: Progressing   

## 2020-04-17 NOTE — Transfer of Care (Signed)
Immediate Anesthesia Transfer of Care Note  Patient: Carl [name]  Procedure(s) Performed: RIGHT TOTAL HIP ARTHROPLASTY ANTERIOR APPROACH (Right Hip)  Patient Location: PACU  Anesthesia Type:Spinal  Level of Consciousness: awake, alert  and oriented  Airway & Oxygen Therapy: Patient Spontanous Breathing and Patient connected to face mask oxygen  Post-op Assessment: Report given to RN and Post -op Vital signs reviewed and stable  Post vital signs: Reviewed and stable  Last Vitals:  Vitals Value Taken Time  BP 156/79 04/17/20 1509  Temp    Pulse 52 04/17/20 1511  Resp 9 04/17/20 1511  SpO2 100 % 04/17/20 1511  Vitals shown include unvalidated device data.  Last Pain:  Vitals:   04/17/20 1209  TempSrc:   PainSc: 0-No pain      Patients Stated Pain Goal: 4 (90/21/11 5520)  Complications: No complications documented.

## 2020-04-17 NOTE — Interval H&P Note (Signed)
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  This was an acute onset.  There is been no other acute change in medical status.  See recent H&P.  The risks and benefits of surgery been discussed in detail and informed consent is obtained.  The right hip has been marked.  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  The various methods of treatment have been discussed with the patient and family. After consideration of risks, benefits and other options for treatment, the patient has consented to  Procedure(s): RIGHT TOTAL HIP ARTHROPLASTY ANTERIOR APPROACH (Right) as a surgical intervention.  The patient's history has been reviewed, patient examined, no change in status, stable for surgery.  I have reviewed the patient's chart and labs.  Questions were answered to the patient's satisfaction.     Mcarthur Rossetti

## 2020-04-17 NOTE — Plan of Care (Signed)
  Problem: Pain Management: Goal: Pain level will decrease with appropriate interventions Outcome: Progressing   Problem: Education: Goal: Knowledge of General Education information will improve Description: Including pain rating scale, medication(s)/side effects and non-pharmacologic comfort measures Outcome: Progressing   

## 2020-04-18 DIAGNOSIS — M1611 Unilateral primary osteoarthritis, right hip: Secondary | ICD-10-CM | POA: Diagnosis not present

## 2020-04-18 DIAGNOSIS — M87051 Idiopathic aseptic necrosis of right femur: Secondary | ICD-10-CM | POA: Diagnosis not present

## 2020-04-18 LAB — BASIC METABOLIC PANEL
Anion gap: 10 (ref 5–15)
BUN: 12 mg/dL (ref 8–23)
CO2: 25 mmol/L (ref 22–32)
Calcium: 8.8 mg/dL — ABNORMAL LOW (ref 8.9–10.3)
Chloride: 100 mmol/L (ref 98–111)
Creatinine, Ser: 0.87 mg/dL (ref 0.61–1.24)
GFR, Estimated: >60 mL/min (ref >60)
Glucose, Bld: 153 mg/dL — ABNORMAL HIGH (ref 70–99)
Potassium: 4.4 mmol/L (ref 3.5–5.1)
Sodium: 135 mmol/L (ref 135–145)

## 2020-04-18 LAB — CBC
HCT: 37.3 % — ABNORMAL LOW (ref 39.0–52.0)
Hemoglobin: 12.8 g/dL — ABNORMAL LOW (ref 13.0–17.0)
MCH: 33.2 pg (ref 26.0–34.0)
MCHC: 34.3 g/dL (ref 30.0–36.0)
MCV: 96.9 fL (ref 80.0–100.0)
Platelets: 211 10*3/uL (ref 150–400)
RBC: 3.85 MIL/uL — ABNORMAL LOW (ref 4.22–5.81)
RDW: 12.5 % (ref 11.5–15.5)
WBC: 14.1 10*3/uL — ABNORMAL HIGH (ref 4.0–10.5)
nRBC: 0 % (ref 0.0–0.2)

## 2020-04-18 MED ORDER — METHOCARBAMOL 500 MG PO TABS: 500.0000 mg | ORAL_TABLET | Freq: Four times a day (QID) | ORAL | 1 refills | Status: DC | PRN

## 2020-04-18 MED ORDER — OXYCODONE HCL 5 MG PO TABS: 5.0000 mg | ORAL_TABLET | ORAL | 0 refills | Status: DC | PRN

## 2020-04-18 NOTE — Progress Notes (Signed)
Physical Therapy Treatment Patient Details Name: Carl [name] MRN: [id number] DOB: 07-16-1945 Today's Date: 04/18/2020    History of Present Illness Patient is 75 y.o. male s/p Rt THA on 04/17/20 with PMH significant for HLD, GERD, OA, depression, anxiety, AVN of Rt hip, Lt TKA, CABG in 2019.    PT Comments    Pt is progressing with mobility. Plan was for d/c home on today however pt still exhibits some impaired balance and decreased safety when mobilizing. Pt's caregiver will be his sister who was present during session. She appears to have some mobility issues of her own. Discussed pt remaining overnight to continue in house PT and continue working towards his PT goals-pt was agreeable (RN made aware as well).     Follow Up Recommendations  Follow surgeon's recommendation for DC plan and follow-up therapies;Supervision/Assistance - 24 hour     Equipment Recommendations  Rolling walker with 5" wheels    Recommendations for Other Services       Precautions / Restrictions Precautions Precautions: Fall Restrictions Weight Bearing Restrictions: No RLE Weight Bearing: Weight bearing as tolerated    Mobility  Bed Mobility               General bed mobility comments: oob in recliner    Transfers Overall transfer level: Needs assistance Equipment used: Rolling walker (2 wheeled) Transfers: Sit to/from Stand Sit to Stand: Min assist         General transfer comment: Unsteady with a posterior bias. Min assistance to prevent LOB. Cues for safety, technique, hand placement.  Ambulation/Gait Ambulation/Gait assistance: Min assist Gait Distance (Feet): 135 Feet Assistive device: Rolling walker (2 wheeled) Gait Pattern/deviations: Step-to pattern;Step-through pattern;Decreased stride length     General Gait Details: Cues for safety, technique, sequence, RW proximity. Assist to stabilize pt intermittently.   Stairs             Wheelchair Mobility    Modified  Rankin (Stroke Patients Only)       Balance Overall balance assessment: Needs assistance         Standing balance support: Bilateral upper extremity supported Standing balance-Leahy Scale: Poor                              Cognition Arousal/Alertness: Awake/alert Behavior During Therapy: WFL for tasks assessed/performed Overall Cognitive Status: Within Functional Limits for tasks assessed                                 General Comments: HOH      Exercises      General Comments        Pertinent Vitals/Pain Pain Assessment: Faces Faces Pain Scale: Hurts little more Pain Location: R hip Pain Descriptors / Indicators: Aching;Discomfort;Sore Pain Intervention(s): Limited activity within patient's tolerance;Monitored during session;Repositioned    Home Living                      Prior Function            PT Goals (current goals can now be found in the care plan section) Progress towards PT goals: Progressing toward goals    Frequency    7X/week      PT Plan Current plan remains appropriate    Co-evaluation              AM-PAC PT "6 Clicks"  Mobility   Outcome Measure  Help needed turning from your back to your side while in a flat bed without using bedrails?: A Little Help needed moving from lying on your back to sitting on the side of a flat bed without using bedrails?: A Little Help needed moving to and from a bed to a chair (including a wheelchair)?: A Little Help needed standing up from a chair using your arms (e.g., wheelchair or bedside chair)?: A Little Help needed to walk in hospital room?: A Little Help needed climbing 3-5 steps with a railing? : A Lot 6 Click Score: 17    End of Session Equipment Utilized During Treatment: Gait belt Activity Tolerance: Patient tolerated treatment well Patient left: in chair;with call bell/phone within reach;with chair alarm set;with family/visitor present   PT  Visit Diagnosis: Unsteadiness on feet (R26.81);Other abnormalities of gait and mobility (R26.89) Pain - Right/Left: Right Pain - part of body: Hip     Time: 1595-3967 PT Time Calculation (min) (ACUTE ONLY): 21 min  Charges:  $Gait Training: 8-22 mins                        Doreatha Massed, PT Acute Rehabilitation  Office: [phone] Pager: [phone]

## 2020-04-18 NOTE — Plan of Care (Signed)
  Problem: Education: Goal: Knowledge of General Education information will improve Description: Including pain rating scale, medication(s)/side effects and non-pharmacologic comfort measures Outcome: Progressing   Problem: Health Behavior/Discharge Planning: Goal: Ability to manage health-related needs will improve Outcome: Progressing   Problem: Activity: Goal: Risk for activity intolerance will decrease Outcome: Progressing   

## 2020-04-18 NOTE — Progress Notes (Signed)
Physical Therapy Treatment Patient Details Name: Carl [name] MRN: [id number] DOB: 01-Oct-1945 Today's Date: 04/18/2020    History of Present Illness Patient is 75 y.o. male s/p Rt THA on 04/17/20 with PMH significant for HLD, GERD, OA, depression, anxiety, AVN of Rt hip, Lt TKA, CABG in 2019.    PT Comments    Progressing slowly. Required Mod assist for standing on today. LOB multiple times while standing statically in front of the recliner. High fall risk. Pt was eventually able to ambulate in the hallway but he continued to require assistance for balance. Will plan to have a 2nd session to continue to work towards meeting therapy goals.    Follow Up Recommendations  Follow surgeon's recommendation for DC plan and follow-up therapies;Supervision/Assistance - 24 hour     Equipment Recommendations  Rolling walker with 5" wheels    Recommendations for Other Services       Precautions / Restrictions Precautions Precautions: Fall Restrictions Weight Bearing Restrictions: No RLE Weight Bearing: Weight bearing as tolerated    Mobility  Bed Mobility               General bed mobility comments: oob in recliner    Transfers Overall transfer level: Needs assistance Equipment used: Rolling walker (2 wheeled) Transfers: Sit to/from Stand Sit to Stand: Mod assist         General transfer comment: Unsteady with a posterior bias. LOB multiple times requiring assistance to prevent fall. Cues for safety, technique, hand placement.  Ambulation/Gait Ambulation/Gait assistance: Min assist;+2 safety/equipment Gait Distance (Feet): 100 Feet Assistive device: Rolling walker (2 wheeled) Gait Pattern/deviations: Step-to pattern;Step-through pattern;Decreased stride length     General Gait Details: Cues for safety, technique, sequence, RW proximity. Assist to stabilize pt throughout distance 2* unsteadiness.   Stairs             Wheelchair Mobility    Modified Rankin  (Stroke Patients Only)       Balance Overall balance assessment: Needs assistance         Standing balance support: Bilateral upper extremity supported Standing balance-Leahy Scale: Poor                              Cognition Arousal/Alertness: Awake/alert Behavior During Therapy: WFL for tasks assessed/performed Overall Cognitive Status: Within Functional Limits for tasks assessed                                        Exercises      General Comments        Pertinent Vitals/Pain Pain Assessment: Faces Faces Pain Scale: Hurts even more Pain Location: R hip Pain Descriptors / Indicators: Aching;Discomfort;Sore Pain Intervention(s): Monitored during session    Home Living                      Prior Function            PT Goals (current goals can now be found in the care plan section) Progress towards PT goals: Progressing toward goals    Frequency    7X/week      PT Plan Current plan remains appropriate    Co-evaluation              AM-PAC PT "6 Clicks" Mobility   Outcome Measure  Help needed turning from your back  to your side while in a flat bed without using bedrails?: A Little Help needed moving from lying on your back to sitting on the side of a flat bed without using bedrails?: A Little Help needed moving to and from a bed to a chair (including a wheelchair)?: A Little Help needed standing up from a chair using your arms (e.g., wheelchair or bedside chair)?: A Lot Help needed to walk in hospital room?: A Lot Help needed climbing 3-5 steps with a railing? : A Lot 6 Click Score: 15    End of Session Equipment Utilized During Treatment: Gait belt Activity Tolerance: Patient tolerated treatment well Patient left: in chair;with call bell/phone within reach;with chair alarm set   PT Visit Diagnosis: Unsteadiness on feet (R26.81);Muscle weakness (generalized) (M62.81);Pain Pain - Right/Left: Right Pain -  part of body: Hip     Time: 7445-1460 PT Time Calculation (min) (ACUTE ONLY): 15 min  Charges:  $Gait Training: 8-22 mins                        Doreatha Massed, PT Acute Rehabilitation  Office: [phone] Pager: [phone]

## 2020-04-18 NOTE — Discharge Instructions (Signed)

## 2020-04-18 NOTE — Progress Notes (Signed)
Subjective: 1 Day Post-Op Procedure(s) (LRB): RIGHT TOTAL HIP ARTHROPLASTY ANTERIOR APPROACH (Right) Patient reports pain as moderate.    Objective: Vital signs in last 24 hours: Temp:  [97.3 F (36.3 C)-98.7 F (37.1 C)] 98.1 F (36.7 C) (03/12 0603) Pulse Rate:  [55-82] 70 (03/12 0603) Resp:  [10-24] 20 (03/12 0603) BP: (132-171)/(62-87) 159/67 (03/12 0603) SpO2:  [94 %-100 %] 99 % (03/12 0603) Weight:  [87.1 kg] 87.1 kg (03/11 1209)  Intake/Output from previous day: 03/11 0701 - 03/12 0700 In: 4113 [P.O.:1380; I.V.:2583; IV Piggyback:150] Out: 3850 [Urine:3700; Blood:150] Intake/Output this shift: Total I/O In: 240 [P.O.:240] Out: -   Recent Labs    04/18/20 0315  HGB 12.8*   Recent Labs    04/18/20 0315  WBC 14.1*  RBC 3.85*  HCT 37.3*  PLT 211   Recent Labs    04/18/20 0315  NA 135  K 4.4  CL 100  CO2 25  BUN 12  CREATININE 0.87  GLUCOSE 153*  CALCIUM 8.8*   No results for input(s): LABPT, INR in the last 72 hours.  Sensation intact distally Intact pulses distally Dorsiflexion/Plantar flexion intact Incision: dressing C/D/I   Assessment/Plan: 1 Day Post-Op Procedure(s) (LRB): RIGHT TOTAL HIP ARTHROPLASTY ANTERIOR APPROACH (Right) Up with therapy Discharge home with home health this afternoon.    Patient's anticipated LOS is less than 2 midnights, meeting these requirements: - Younger than 76 - Lives within 1 hour of care - Has a competent adult at home to recover with post-op recover - NO history of  - Chronic pain requiring opiods  - Diabetes  - Coronary Artery Disease  - Heart failure  - Heart attack  - Stroke  - DVT/VTE  - Cardiac arrhythmia  - Respiratory Failure/COPD  - Renal failure  - Anemia  - Advanced Liver disease       Mcarthur Rossetti 04/18/2020, 8:39 AM

## 2020-04-18 NOTE — TOC Progression Note (Signed)
Transition of Care [facility]) - Progression Note    Patient Details  Name: Carl [name] MRN: [id number] Date of Birth: [birth year]  [facility] CM/SW Contact  Joaquin Courts, RN Phone Number: 04/18/2020, 10:49 AM  Clinical Narrative:    CM spoke with patient, [employer] to deliver rolling walker and 3in1.  [facility] to provide home health physical therapy, anticipate delay in start of care due to staffing, provider on call,  [facility], made aware of this.  Anticipate start of care Monday or Tuesday.    Expected Discharge Plan: [place] Barriers to Discharge: No Barriers Identified  Expected Discharge Plan and Services Expected Discharge Plan: [place]   Discharge Planning Services: CM Consult Post Acute Care Choice: [place] arrangements for the past 2 months: Single Family Home Expected Discharge Date: 04/18/20               DME Arranged: Gilford Rile rolling,3-N-1 DME Agency: [employer] Date DME Agency Contacted: 04/18/20 Time DME Agency Contacted: [id number] Representative spoke with at DME Agency: [place]: PT [place]: [place] Date [place]: 04/18/20 Time [place]: [place] Representative spoke with at [place]: [place] ([place]) Interventions    Readmission Risk Interventions No flowsheet data found.

## 2020-04-18 NOTE — Discharge Summary (Signed)
Patient ID: Carl [name] MRN: [id number] DOB/AGE: November 22, 1945 75 y.o.  Admit date: 04/17/2020 Discharge date: 04/18/2020  Admission Diagnoses:  Principal Problem:   Avascular necrosis of bone of right hip [facility]) Active Problems:   Status post total replacement of right hip   Discharge Diagnoses:  Same  Past Medical History:  Diagnosis Date  . Anxiety    takes Xanax daily as needed  . Arthritis   . Cataract    removed both eyes  . Depression    takes Celexa daily  . Diverticulitis   . Diverticulosis   . Enlarged prostate    sightly  . Esophagitis   . Gastric ulcer   . GERD (gastroesophageal reflux disease)    takes Protonix daily  . Hx of adenomatous colonic polyps    benign  . Hyperlipidemia    takes Zocor daily  . Insomnia    takes Ambien nightly  . Joint pain   . Joint swelling     Surgeries: Procedure(s): RIGHT TOTAL HIP ARTHROPLASTY ANTERIOR APPROACH on 04/17/2020   Consultants:   Discharged Condition: Improved  Hospital Course: Carl [name] is an 75 y.o. male who was admitted 04/17/2020 for operative treatment ofAvascular necrosis of bone of right hip ([place]). Patient has severe unremitting pain that affects sleep, daily activities, and work/hobbies. After pre-op clearance the patient was taken to the operating room on 04/17/2020 and underwent  Procedure(s): RIGHT TOTAL HIP ARTHROPLASTY ANTERIOR APPROACH.    Patient was given perioperative antibiotics:  Anti-infectives (From admission, onward)   Start     Dose/Rate Route Frequency Ordered Stop   04/17/20 2200  acyclovir (ZOVIRAX) tablet 400 mg        400 mg Oral 2 times daily 04/17/20 1733     04/17/20 2000  ceFAZolin (ANCEF) IVPB 1 g/50 mL premix        1 g 100 mL/hr over 30 Minutes Intravenous Every 6 hours 04/17/20 1733 04/18/20 0318   04/17/20 1200  ceFAZolin (ANCEF) IVPB 2g/100 mL premix        2 g 200 mL/hr over 30 Minutes Intravenous On call to O.R. 04/17/20 1145 04/17/20 1336       Patient was given  sequential compression devices, early ambulation, and chemoprophylaxis to prevent DVT.  Patient benefited maximally from hospital stay and there were no complications.    Recent vital signs:  Patient Vitals for the past 24 hrs:  BP Temp Temp src Pulse Resp SpO2 Height Weight  04/18/20 0603 (!) 159/67 98.1 F (36.7 C) Oral 70 20 99 % - -  04/18/20 0153 139/65 98.7 F (37.1 C) Oral 67 18 96 % - -  04/17/20 2140 132/87 98.4 F (36.9 C) Oral 72 16 94 % - -  04/17/20 1940 (!) 141/75 97.8 F (36.6 C) Oral 82 18 98 % - -  04/17/20 1740 (!) 152/71 97.7 F (36.5 C) Oral 61 18 100 % - -  04/17/20 1715 (!) 153/74 97.7 F (36.5 C) - 63 17 100 % - -  04/17/20 1700 (!) 165/76 - - 61 12 100 % - -  04/17/20 1645 (!) 166/73 - - 60 13 100 % - -  04/17/20 1630 (!) 166/74 - - (!) 57 15 100 % - -  04/17/20 1615 (!) 171/75 (!) 97.5 F (36.4 C) - (!) 57 12 100 % - -  04/17/20 1600 (!) 160/86 - - 60 13 100 % - -  04/17/20 1545 (!) 158/78 - - (!) 56 13  100 % - -  04/17/20 1530 (!) 168/78 - - (!) 55 16 100 % - -  04/17/20 1515 (!) 150/77 - - (!) 58 (!) 24 100 % - -  04/17/20 1509 (!) 156/79 (!) 97.3 F (36.3 C) - (!) 59 10 100 % - -  04/17/20 1209 - - - - - - 5' 8.5" (1.74 m) 87.1 kg  04/17/20 1141 140/62 97.8 F (36.6 C) Oral (!) 58 18 98 % - -     Recent laboratory studies:  Recent Labs    04/18/20 0315  WBC 14.1*  HGB 12.8*  HCT 37.3*  PLT 211  NA 135  K 4.4  CL 100  CO2 25  BUN 12  CREATININE 0.87  GLUCOSE 153*  CALCIUM 8.8*     Discharge Medications:   Allergies as of 04/18/2020   No Known Allergies     Medication List    STOP taking these medications   HYDROcodone-acetaminophen 5-325 MG tablet Commonly known as: NORCO/VICODIN     TAKE these medications   acetaminophen 500 MG tablet Commonly known as: TYLENOL Take 1,000 mg by mouth every 6 (six) hours as needed for moderate pain or headache.   acyclovir 400 MG tablet Commonly known as: ZOVIRAX Take 400 mg by mouth 2  (two) times daily.   aspirin EC 81 MG tablet Take 1 tablet (81 mg total) by mouth daily.   citalopram 20 MG tablet Commonly known as: CELEXA Take 20 mg by mouth daily.   Eliquis 5 MG Tabs tablet Generic drug: apixaban TAKE 1 TABLET(5 MG) BY MOUTH TWICE DAILY What changed: See the new instructions.   ferrous sulfate 325 (65 FE) MG tablet Take 325 mg by mouth daily.   levothyroxine 150 MCG tablet Commonly known as: SYNTHROID TAKE 1 TABLET(150 MCG) BY MOUTH DAILY What changed: See the new instructions.   loratadine 10 MG tablet Commonly known as: CLARITIN Take 10 mg by mouth daily as needed for allergies.   methocarbamol 500 MG tablet Commonly known as: ROBAXIN Take 1 tablet (500 mg total) by mouth every 6 (six) hours as needed for muscle spasms.   metoprolol tartrate 50 MG tablet Commonly known as: LOPRESSOR Take 1 tablet (50 mg total) by mouth 2 (two) times daily.   nitroGLYCERIN 0.4 MG SL tablet Commonly known as: NITROSTAT DISSOLVE 1 TABLET UNDER THE TONGUE EVERY 5 MINUTES AS NEEDED FOR CHEST PAIN   oxyCODONE 5 MG immediate release tablet Commonly known as: Oxy IR/ROXICODONE Take 1-2 tablets (5-10 mg total) by mouth every 4 (four) hours as needed for moderate pain (pain score 4-6).   oxymetazoline 0.05 % nasal spray Commonly known as: AFRIN Place 1 spray into both nostrils 2 (two) times daily as needed for congestion.   pantoprazole 20 MG tablet Commonly known as: PROTONIX TAKE 2 TABLETS(40 MG) BY MOUTH DAILY What changed: See the new instructions.   PRESCRIPTION MEDICATION Apply 1 application topically daily as needed (dark spots). FLUOROURACIL 5% + CALCIPOTRIENE 0.005%   PRESERVISION AREDS 2 PO Take 1 tablet by mouth 2 (two) times daily.   rosuvastatin 20 MG tablet Commonly known as: CRESTOR Take 1 tablet (20 mg total) by mouth daily.   traMADol 50 MG tablet Commonly known as: ULTRAM Take 1 tablet (50 mg total) by mouth every 12 (twelve) hours as  needed. What changed: reasons to take this   Vascepa 1 g capsule Generic drug: icosapent Ethyl TAKE 2 CAPSULES(2 GRAMS) BY MOUTH TWICE DAILY What changed: See  the new instructions.   zolpidem 10 MG tablet Commonly known as: AMBIEN Take 10 mg by mouth at bedtime.            Durable Medical Equipment  (From admission, onward)         Start     Ordered   04/17/20 1734  DME 3 n 1  Once        04/17/20 1733   04/17/20 1734  DME Walker rolling  Once       Question Answer Comment  Walker: With 5 Inch Wheels   Patient needs a walker to treat with the following condition Status post total replacement of right hip      04/17/20 1733          Diagnostic Studies: DG Pelvis Portable  Result Date: 04/17/2020 CLINICAL DATA:  Status post right hip replacement. EXAM: PORTABLE PELVIS 1-2 VIEWS COMPARISON:  Preoperative radiograph 03/05/2020 FINDINGS: Right hip arthroplasty in expected alignment. Femoral stem is midline. No periprosthetic lucency or fracture. Recent postsurgical change includes air and edema in the joint space and soft tissues. IMPRESSION: Right hip arthroplasty without immediate postoperative complication. Electronically Signed   By: Keith Rake M.D.   On: 04/17/2020 15:51   MR Hip Right w/o contrast  Result Date: 04/02/2020 CLINICAL DATA:  Right hip pain radiating to the knee for approximately 6 weeks. No known injury. EXAM: MR OF THE RIGHT HIP WITHOUT CONTRAST TECHNIQUE: Multiplanar, multisequence MR imaging was performed. No intravenous contrast was administered. COMPARISON:  None. FINDINGS: Bones: There is avascular necrosis of the superior femoral head measuring approximately 2 cm AP by 2.2 cm transverse. Fluid undermines cortical bone in this location. There is intense marrow edema in the femoral head and neck. Also seen is intense edema in the right acetabular roof with an incomplete T1 and T2 hypointense line in subchondral bone consistent with an insufficiency  or stress fracture. No avascular necrosis of the left femoral head. Mild osteophytosis is seen about both hips. Lower lumbar degenerative disc disease with loss of disc space height and endplate spurring also noted. No fracture. Articular cartilage and labrum Articular cartilage: Severely degenerated on the right with bone-on-bone joint space narrowing. Labrum:  Degenerated and torn. Joint or bursal effusion Joint effusion:  Small joint effusion on the right. Bursae: There is some fluid in the right trochanteric bursa. Muscles and tendons Muscles and tendons: The right gluteus minimus tendon is almost completely torn with mild retraction of approximately 1 cm. There is edema and atrophy of the right gluteus minimus. Other findings Miscellaneous: Imaged intrapelvic contents demonstrate mild prostatomegaly. IMPRESSION: Avascular necrosis of the femoral head with fluid undermining cortical bone worrisome for fragment instability. There is marked marrow edema in the right femoral head and neck consistent with secondary stress change. Intense edema in the right acetabulum due to a nondisplaced insufficiency or stress fracture of the acetabular roof just superior to subchondral bone. Advanced right hip osteoarthritis with associated tearing of the right labrum. Near complete gluteus minimus tear with edema and atrophy in the muscle belly Electronically Signed   By: Inge Rise M.D.   On: 04/02/2020 13:52   DG C-Arm 1-60 Min-No Report  Result Date: 04/17/2020 Fluoroscopy was utilized by the requesting physician.  No radiographic interpretation.   DG HIP OPERATIVE UNILAT W OR W/O PELVIS RIGHT  Result Date: 04/17/2020 CLINICAL DATA:  Right hip replacement. EXAM: OPERATIVE RIGHT HIP (WITH PELVIS IF PERFORMED) TECHNIQUE: Fluoroscopic spot image(s) were submitted for interpretation  post-operatively. COMPARISON:  Radiograph 03/05/2020 FINDINGS: Three fluoroscopic spot views of the right hip obtained in the operating  room. Right hip arthroplasty in expected alignment. Total fluoroscopy time 33 seconds. Total dose 4.7 mGy. IMPRESSION: Procedural fluoroscopy post right hip arthroplasty. Electronically Signed   By: Keith Rake M.D.   On: 04/17/2020 15:50    Disposition: Discharge disposition: 01-Home or [place]    Mcarthur Rossetti, MD Follow up in 2 week(s).   Specialty: Orthopedic Surgery Contact information: [address] [place] [address] [phone]                Signed: Mcarthur Rossetti 04/18/2020, 8:45 AM

## 2020-04-19 DIAGNOSIS — M1611 Unilateral primary osteoarthritis, right hip: Secondary | ICD-10-CM | POA: Diagnosis not present

## 2020-04-19 NOTE — Progress Notes (Signed)
Pt d/c home with family. No needs at time of d/c. No questions on d/c instructions and education. Rn will continue to monitor.

## 2020-04-19 NOTE — Progress Notes (Signed)
Physical Therapy Treatment Patient Details Name: Carl [name] MRN: [id number] DOB: May 02, 1945 Today's Date: 04/19/2020    History of Present Illness Patient is 75 y.o. male s/p Rt THA on 04/17/20 with PMH significant for HLD, GERD, OA, depression, anxiety, AVN of Rt hip, Lt TKA, CABG in 2019.    PT Comments    Pt continues to participate well. Sister was present during session. Issued gait belt for use for safety. All questions answered. Okay to d/c from PT standpoint.    Follow Up Recommendations  Follow surgeon's recommendation for DC plan and follow-up therapies;Supervision/Assistance - 24 hour     Equipment Recommendations  Rolling walker with 5" wheels    Recommendations for Other Services       Precautions / Restrictions Precautions Precautions: Fall Restrictions Weight Bearing Restrictions: No RLE Weight Bearing: Weight bearing as tolerated    Mobility  Bed Mobility               General bed mobility comments: oob in recliner    Transfers Overall transfer level: Needs assistance Equipment used: Rolling walker (2 wheeled) Transfers: Sit to/from Stand Sit to Stand: Min guard         General transfer comment: Min guard for safety. Cues for safety, technique  Ambulation/Gait Ambulation/Gait assistance: Min guard Gait Distance (Feet): 135 Feet Assistive device: Rolling walker (2 wheeled) Gait Pattern/deviations: Step-to pattern;Step-through pattern;Decreased stride length     General Gait Details: Cues for safety, step length. Min guard for Barrister[employer]    Modified Rankin (Stroke Patients Only)       Balance Overall balance assessment: Needs assistance         Standing balance support: Bilateral upper extremity supported Standing balance-Leahy Scale: Poor                              Cognition Arousal/Alertness: Awake/alert Behavior During Therapy: WFL for tasks  assessed/performed Overall Cognitive Status: Within Functional Limits for tasks assessed                                 General Comments: HOH      Exercises     General Comments        Pertinent Vitals/Pain Pain Assessment: 0-10 Pain Score: 5  Pain Location: R hip Pain Descriptors / Indicators: Aching;Discomfort;Sore Pain Intervention(s): Monitored during session;Repositioned    Home Living                      Prior Function            PT Goals (current goals can now be found in the care plan section) Progress towards PT goals: Progressing toward goals    Frequency    7X/week      PT Plan Current plan remains appropriate    Co-evaluation              AM-PAC PT "6 Clicks" Mobility   Outcome Measure  Help needed turning from your back to your side while in a flat bed without using bedrails?: A Little Help needed moving from lying on your back to sitting on the side of a flat bed without using bedrails?: A Little Help needed moving to and from a bed to a chair (including a wheelchair)?:  A Little Help needed standing up from a chair using your arms (e.g., wheelchair or bedside chair)?: A Little Help needed to walk in hospital room?: A Little Help needed climbing 3-5 steps with a railing? : A Little 6 Click Score: 18    End of Session Equipment Utilized During Treatment: Gait belt Activity Tolerance: Patient tolerated treatment well Patient left: in chair;with call bell/phone within reach;with family/visitor present;with chair alarm set   PT Visit Diagnosis: Unsteadiness on feet (R26.81);Other abnormalities of gait and mobility (R26.89) Pain - Right/Left: Right Pain - part of body: Hip     Time: 3074-6002 PT Time Calculation (min) (ACUTE ONLY): 18 min  Charges:  $Gait Training: 8-22 mins              Doreatha Massed, PT Acute Rehabilitation  Office: [phone] Pager: [phone]

## 2020-04-19 NOTE — Progress Notes (Signed)
  Subjective: Patient doing well following right hip replacement.  Patient has no stairs at home.  He is here with his sister   Objective: Vital signs in last 24 hours: Temp:  [97.7 F (36.5 C)-98.4 F (36.9 C)] 97.7 F (36.5 C) (03/13 1340) Pulse Rate:  [64-67] 64 (03/13 1340) Resp:  [16-22] 18 (03/13 1340) BP: (116-153)/(61-84) 116/84 (03/13 1340) SpO2:  [94 %-97 %] 97 % (03/13 1340)  Intake/Output from previous day: 03/12 0701 - 03/13 0700 In: 360 [P.O.:360] Out: 1475 [Urine:1475] Intake/Output this shift: Total I/O In: 840 [P.O.:840] Out: 300 [Urine:300]  Exam:  Dorsiflexion/Plantar flexion intact  Labs: Recent Labs    04/18/20 0315  HGB 12.8*   Recent Labs    04/18/20 0315  WBC 14.1*  RBC 3.85*  HCT 37.3*  PLT 211   Recent Labs    04/18/20 0315  NA 135  K 4.4  CL 100  CO2 25  BUN 12  CREATININE 0.87  GLUCOSE 153*  CALCIUM 8.8*   No results for input(s): LABPT, INR in the last 72 hours.  Assessment/Plan: Plan at this time is discharged to home.  He may have someone help him out of the car when he gets home.  He did well with therapy.   Carl [name] 04/19/2020, 1:46 PM

## 2020-04-19 NOTE — Plan of Care (Signed)
Pt stable at this time. Pt do d/c home with family. Pt has dme at bedside to d/c with.

## 2020-04-19 NOTE — Progress Notes (Signed)
Physical Therapy Treatment Patient Details Name: Carl [name] MRN: [id number] DOB: April 06, 1945 Today's Date: 04/19/2020    History of Present Illness Patient is 75 y.o. male s/p Rt THA on 04/17/20 with PMH significant for HLD, GERD, OA, depression, anxiety, AVN of Rt hip, Lt TKA, CABG in 2019.    PT Comments    Progressing with mobility. Will plan to have one more session prior to d/c home later today.    Follow Up Recommendations  Follow surgeon's recommendation for DC plan and follow-up therapies;Supervision/Assistance - 24 hour     Equipment Recommendations  Rolling walker with 5" wheels    Recommendations for Other Services       Precautions / Restrictions Precautions Precautions: Fall Restrictions Weight Bearing Restrictions: No RLE Weight Bearing: Weight bearing as tolerated    Mobility  Bed Mobility               General bed mobility comments: oob in recliner    Transfers Overall transfer level: Needs assistance Equipment used: Rolling walker (2 wheeled) Transfers: Sit to/from Stand Sit to Stand: Min assist         General transfer comment: Improved sit to stand on today. x 2 practice. Cues for safety, technique, hand placement. Small amount of assist to steady once standing.  Ambulation/Gait Ambulation/Gait assistance: Min assist Gait Distance (Feet): 135 Feet Assistive device: Rolling walker (2 wheeled) Gait Pattern/deviations: Step-to pattern;Step-through pattern;Decreased stride length     General Gait Details: Cues for safety, technique, sequence, RW proximity. Assist to stabilize pt intermittently.   Stairs             Wheelchair Mobility    Modified Rankin (Stroke Patients Only)       Balance Overall balance assessment: Needs assistance         Standing balance support: Bilateral upper extremity supported Standing balance-Leahy Scale: Poor                              Cognition Arousal/Alertness:  Awake/alert Behavior During Therapy: WFL for tasks assessed/performed Overall Cognitive Status: Within Functional Limits for tasks assessed                                 General Comments: HOH      Exercises Total Joint Exercises Ankle Circles/Pumps: AROM;Both;10 reps;Seated Quad Sets: AROM;Both;10 reps;Seated Heel Slides: AAROM;Right;10 reps;Supine Hip ABduction/ADduction: AAROM;Right;10 reps;Seated    General Comments        Pertinent Vitals/Pain Pain Assessment: 0-10 Pain Score: 5  Pain Location: R hip Pain Descriptors / Indicators: Aching;Discomfort;Sore Pain Intervention(s): Monitored during session;Repositioned    Home Living                      Prior Function            PT Goals (current goals can now be found in the care plan section) Progress towards PT goals: Progressing toward goals    Frequency    7X/week      PT Plan Current plan remains appropriate    Co-evaluation              AM-PAC PT "6 Clicks" Mobility   Outcome Measure  Help needed turning from your back to your side while in a flat bed without using bedrails?: A Little Help needed moving from lying on your back to sitting  on the side of a flat bed without using bedrails?: A Little Help needed moving to and from a bed to a chair (including a wheelchair)?: A Little Help needed standing up from a chair using your arms (e.g., wheelchair or bedside chair)?: A Little Help needed to walk in hospital room?: A Little Help needed climbing 3-5 steps with a railing? : A Little 6 Click Score: 18    End of Session Equipment Utilized During Treatment: Gait belt Activity Tolerance: Patient tolerated treatment well Patient left: in chair;with call bell/phone within reach;with chair alarm set   PT Visit Diagnosis: Unsteadiness on feet (R26.81);Other abnormalities of gait and mobility (R26.89) Pain - Right/Left: Right Pain - part of body: Hip     Time: 4961-1643 PT  Time Calculation (min) (ACUTE ONLY): 18 min  Charges:  $Gait Training: 8-22 mins                        Doreatha Massed, PT Acute Rehabilitation  Office: [phone] Pager: [phone]

## 2020-04-19 NOTE — Plan of Care (Signed)
  Problem: Activity: Goal: Ability to tolerate increased activity will improve Outcome: Progressing   Problem: Clinical Measurements: Goal: Postoperative complications will be avoided or minimized Outcome: Progressing   Problem: Pain Management: Goal: Pain level will decrease with appropriate interventions Outcome: Progressing   Problem: Health Behavior/Discharge Planning: Goal: Ability to manage health-related needs will improve Outcome: Progressing   Problem: Clinical Measurements: Goal: Ability to maintain clinical measurements within normal limits will improve Outcome: Progressing

## 2020-04-20 ENCOUNTER — Encounter (HOSPITAL_COMMUNITY): Payer: Self-pay | Admitting: Orthopaedic Surgery

## 2020-04-20 ENCOUNTER — Other Ambulatory Visit: Payer: Self-pay

## 2020-04-20 ENCOUNTER — Ambulatory Visit (INDEPENDENT_AMBULATORY_CARE_PROVIDER_SITE_OTHER): Payer: PPO | Admitting: Physician Assistant

## 2020-04-20 ENCOUNTER — Telehealth: Payer: Self-pay

## 2020-04-20 DIAGNOSIS — Z96641 Presence of right artificial hip joint: Secondary | ICD-10-CM

## 2020-04-20 NOTE — Telephone Encounter (Signed)
Patient coming in this afternoon

## 2020-04-20 NOTE — Telephone Encounter (Signed)
Bring him im

## 2020-04-20 NOTE — Progress Notes (Signed)
HPI: [name] is status post right total hip arthroplasty on 04/17/2020.  Comes in today due to bleeding through the bandage.  He has had no real dizziness lightheadedness.  He has had no falls since being discharged.  Physical exam: Right hip significant amount of ecchymosis.  Some fullness to the thigh consistent with a hematoma.Right calf supple non tender.  Staples well approximate the incision.  Bandage has moderate amount of blood accumulation.  Once bandage removed no evidence of active bleed.  No expressible purulence.  No signs of infection.  Is able to get on and off the exam table with minimal assistance.  Right hip attempted aspiration yields 5 cc of bloody aspirate.  Patient tolerates well.   Impression: Status post right total hip arthroplasty 04/17/2020  Plan: We will have him hold his Eliquis this evening and tomorrow morning and go back on his Eliquis twice daily.  Compression bandage with an Ace bandage was applied to the right thigh.  We will see him back on Wednesday sooner if there is any questions concerns.  Extra dressing supplies were given discussed with him how to care for the incision.

## 2020-04-20 NOTE — Telephone Encounter (Signed)
Patient's sister Carl [name] called stating that patient's incision is bleeding when he gets up to move and his dressing is saturated.  Patient had right THA on Friday, 04/17/2020. CB# [phone] for Carl [name] [phone] for patient.  Please advise.  Thank you.

## 2020-04-22 ENCOUNTER — Encounter: Payer: Self-pay | Admitting: Physician Assistant

## 2020-04-22 ENCOUNTER — Other Ambulatory Visit: Payer: Self-pay

## 2020-04-22 ENCOUNTER — Ambulatory Visit (INDEPENDENT_AMBULATORY_CARE_PROVIDER_SITE_OTHER): Payer: PPO | Admitting: Physician Assistant

## 2020-04-22 DIAGNOSIS — K5790 Diverticulosis of intestine, part unspecified, without perforation or abscess without bleeding: Secondary | ICD-10-CM | POA: Diagnosis not present

## 2020-04-22 DIAGNOSIS — Z96641 Presence of right artificial hip joint: Secondary | ICD-10-CM | POA: Diagnosis not present

## 2020-04-22 DIAGNOSIS — Z9181 History of falling: Secondary | ICD-10-CM | POA: Diagnosis not present

## 2020-04-22 DIAGNOSIS — M25451 Effusion, right hip: Secondary | ICD-10-CM | POA: Diagnosis not present

## 2020-04-22 DIAGNOSIS — M5136 Other intervertebral disc degeneration, lumbar region: Secondary | ICD-10-CM | POA: Diagnosis not present

## 2020-04-22 DIAGNOSIS — N400 Enlarged prostate without lower urinary tract symptoms: Secondary | ICD-10-CM | POA: Diagnosis not present

## 2020-04-22 DIAGNOSIS — Z7982 Long term (current) use of aspirin: Secondary | ICD-10-CM | POA: Diagnosis not present

## 2020-04-22 DIAGNOSIS — F32A Depression, unspecified: Secondary | ICD-10-CM | POA: Diagnosis not present

## 2020-04-22 DIAGNOSIS — Z86010 Personal history of colonic polyps: Secondary | ICD-10-CM | POA: Diagnosis not present

## 2020-04-22 DIAGNOSIS — G4700 Insomnia, unspecified: Secondary | ICD-10-CM | POA: Diagnosis not present

## 2020-04-22 DIAGNOSIS — Z79891 Long term (current) use of opiate analgesic: Secondary | ICD-10-CM | POA: Diagnosis not present

## 2020-04-22 DIAGNOSIS — Z7901 Long term (current) use of anticoagulants: Secondary | ICD-10-CM | POA: Diagnosis not present

## 2020-04-22 DIAGNOSIS — E785 Hyperlipidemia, unspecified: Secondary | ICD-10-CM | POA: Diagnosis not present

## 2020-04-22 DIAGNOSIS — Z8711 Personal history of peptic ulcer disease: Secondary | ICD-10-CM | POA: Diagnosis not present

## 2020-04-22 DIAGNOSIS — M170 Bilateral primary osteoarthritis of knee: Secondary | ICD-10-CM | POA: Diagnosis not present

## 2020-04-22 DIAGNOSIS — I119 Hypertensive heart disease without heart failure: Secondary | ICD-10-CM | POA: Diagnosis not present

## 2020-04-22 DIAGNOSIS — Z471 Aftercare following joint replacement surgery: Secondary | ICD-10-CM | POA: Diagnosis not present

## 2020-04-22 DIAGNOSIS — F419 Anxiety disorder, unspecified: Secondary | ICD-10-CM | POA: Diagnosis not present

## 2020-04-22 DIAGNOSIS — K219 Gastro-esophageal reflux disease without esophagitis: Secondary | ICD-10-CM | POA: Diagnosis not present

## 2020-04-22 MED ORDER — OXYCODONE HCL 5 MG PO TABS: 5.0000 mg | ORAL_TABLET | ORAL | 0 refills | Status: DC | PRN

## 2020-04-22 MED ORDER — DOXYCYCLINE HYCLATE 100 MG PO TABS: 100.0000 mg | ORAL_TABLET | Freq: Two times a day (BID) | ORAL | 0 refills | Status: AC

## 2020-04-22 NOTE — Progress Notes (Signed)
HPI: Mr. Cherubini returns today for wound check right hip.  He said no further bleeding.  He is taking oxycodone.  He feels that the overall things are improving.  He has no complaints.  Right hip incision in firmness about the thigh is resolving.  Ecchymosis is beginning to dissipate.  There is no active bleeding.  Staples well approximated skin no dehiscence of the wound.  No signs of infection.  Impression: Status post right total hip arthroplasty in 04/17/2020 Right hip hematoma  Plan: We will having follow-up at his regularly scheduled appointment.  Did put him on empirical doxycycline.  Refilled his pain medication.  He will follow up sooner if there is any questions concerns.

## 2020-04-24 DIAGNOSIS — Z8711 Personal history of peptic ulcer disease: Secondary | ICD-10-CM | POA: Diagnosis not present

## 2020-04-24 DIAGNOSIS — Z7901 Long term (current) use of anticoagulants: Secondary | ICD-10-CM | POA: Diagnosis not present

## 2020-04-24 DIAGNOSIS — G4700 Insomnia, unspecified: Secondary | ICD-10-CM | POA: Diagnosis not present

## 2020-04-24 DIAGNOSIS — K5790 Diverticulosis of intestine, part unspecified, without perforation or abscess without bleeding: Secondary | ICD-10-CM | POA: Diagnosis not present

## 2020-04-24 DIAGNOSIS — E785 Hyperlipidemia, unspecified: Secondary | ICD-10-CM | POA: Diagnosis not present

## 2020-04-24 DIAGNOSIS — Z96641 Presence of right artificial hip joint: Secondary | ICD-10-CM | POA: Diagnosis not present

## 2020-04-24 DIAGNOSIS — Z7982 Long term (current) use of aspirin: Secondary | ICD-10-CM | POA: Diagnosis not present

## 2020-04-24 DIAGNOSIS — M5136 Other intervertebral disc degeneration, lumbar region: Secondary | ICD-10-CM | POA: Diagnosis not present

## 2020-04-24 DIAGNOSIS — M25451 Effusion, right hip: Secondary | ICD-10-CM | POA: Diagnosis not present

## 2020-04-24 DIAGNOSIS — F419 Anxiety disorder, unspecified: Secondary | ICD-10-CM | POA: Diagnosis not present

## 2020-04-24 DIAGNOSIS — M170 Bilateral primary osteoarthritis of knee: Secondary | ICD-10-CM | POA: Diagnosis not present

## 2020-04-24 DIAGNOSIS — Z471 Aftercare following joint replacement surgery: Secondary | ICD-10-CM | POA: Diagnosis not present

## 2020-04-24 DIAGNOSIS — Z9181 History of falling: Secondary | ICD-10-CM | POA: Diagnosis not present

## 2020-04-24 DIAGNOSIS — Z86010 Personal history of colonic polyps: Secondary | ICD-10-CM | POA: Diagnosis not present

## 2020-04-24 DIAGNOSIS — N400 Enlarged prostate without lower urinary tract symptoms: Secondary | ICD-10-CM | POA: Diagnosis not present

## 2020-04-24 DIAGNOSIS — F32A Depression, unspecified: Secondary | ICD-10-CM | POA: Diagnosis not present

## 2020-04-24 DIAGNOSIS — K219 Gastro-esophageal reflux disease without esophagitis: Secondary | ICD-10-CM | POA: Diagnosis not present

## 2020-04-24 DIAGNOSIS — I119 Hypertensive heart disease without heart failure: Secondary | ICD-10-CM | POA: Diagnosis not present

## 2020-04-24 DIAGNOSIS — Z79891 Long term (current) use of opiate analgesic: Secondary | ICD-10-CM | POA: Diagnosis not present

## 2020-04-27 ENCOUNTER — Other Ambulatory Visit: Payer: Self-pay | Admitting: Cardiovascular Disease

## 2020-04-30 ENCOUNTER — Ambulatory Visit (INDEPENDENT_AMBULATORY_CARE_PROVIDER_SITE_OTHER): Payer: PPO | Admitting: Orthopaedic Surgery

## 2020-04-30 ENCOUNTER — Encounter: Payer: Self-pay | Admitting: Orthopaedic Surgery

## 2020-04-30 DIAGNOSIS — Z96641 Presence of right artificial hip joint: Secondary | ICD-10-CM

## 2020-04-30 MED ORDER — OXYCODONE HCL 5 MG PO TABS
5.0000 mg | ORAL_TABLET | Freq: Four times a day (QID) | ORAL | 0 refills | Status: DC | PRN
Start: 2020-04-30 — End: 2020-05-15

## 2020-04-30 NOTE — Progress Notes (Signed)
Carl [name] is 2 weeks tomorrow status post a right total hip arthroplasty secondary to avascular necrosis.  He is doing well overall.  He is ambulate with a walker but does not use it much.  He feels like he is ready to transition to a cane.  The incision looks good on his right hip.  There is no seroma but there is a hematoma.  The staples were removed and Steri-Strips applied.  His leg lengths are equal.  He will continue to slowly increase his activities as comfort allows.  I will send in some more pain medication for him.  All questions and concerns were answered addressed.  I would like to see him back in 4 weeks to see how he is doing overall.  No x-rays are needed.

## 2020-05-04 DIAGNOSIS — Z7982 Long term (current) use of aspirin: Secondary | ICD-10-CM | POA: Diagnosis not present

## 2020-05-04 DIAGNOSIS — E785 Hyperlipidemia, unspecified: Secondary | ICD-10-CM | POA: Diagnosis not present

## 2020-05-04 DIAGNOSIS — Z471 Aftercare following joint replacement surgery: Secondary | ICD-10-CM | POA: Diagnosis not present

## 2020-05-04 DIAGNOSIS — Z96641 Presence of right artificial hip joint: Secondary | ICD-10-CM | POA: Diagnosis not present

## 2020-05-04 DIAGNOSIS — K219 Gastro-esophageal reflux disease without esophagitis: Secondary | ICD-10-CM | POA: Diagnosis not present

## 2020-05-04 DIAGNOSIS — Z86010 Personal history of colonic polyps: Secondary | ICD-10-CM | POA: Diagnosis not present

## 2020-05-04 DIAGNOSIS — Z79891 Long term (current) use of opiate analgesic: Secondary | ICD-10-CM | POA: Diagnosis not present

## 2020-05-04 DIAGNOSIS — M170 Bilateral primary osteoarthritis of knee: Secondary | ICD-10-CM | POA: Diagnosis not present

## 2020-05-04 DIAGNOSIS — G4700 Insomnia, unspecified: Secondary | ICD-10-CM | POA: Diagnosis not present

## 2020-05-04 DIAGNOSIS — Z8711 Personal history of peptic ulcer disease: Secondary | ICD-10-CM | POA: Diagnosis not present

## 2020-05-04 DIAGNOSIS — K5790 Diverticulosis of intestine, part unspecified, without perforation or abscess without bleeding: Secondary | ICD-10-CM | POA: Diagnosis not present

## 2020-05-04 DIAGNOSIS — F32A Depression, unspecified: Secondary | ICD-10-CM | POA: Diagnosis not present

## 2020-05-04 DIAGNOSIS — M5136 Other intervertebral disc degeneration, lumbar region: Secondary | ICD-10-CM | POA: Diagnosis not present

## 2020-05-04 DIAGNOSIS — N400 Enlarged prostate without lower urinary tract symptoms: Secondary | ICD-10-CM | POA: Diagnosis not present

## 2020-05-04 DIAGNOSIS — M25451 Effusion, right hip: Secondary | ICD-10-CM | POA: Diagnosis not present

## 2020-05-04 DIAGNOSIS — F419 Anxiety disorder, unspecified: Secondary | ICD-10-CM | POA: Diagnosis not present

## 2020-05-04 DIAGNOSIS — Z9181 History of falling: Secondary | ICD-10-CM | POA: Diagnosis not present

## 2020-05-04 DIAGNOSIS — I119 Hypertensive heart disease without heart failure: Secondary | ICD-10-CM | POA: Diagnosis not present

## 2020-05-04 DIAGNOSIS — Z7901 Long term (current) use of anticoagulants: Secondary | ICD-10-CM | POA: Diagnosis not present

## 2020-05-05 ENCOUNTER — Other Ambulatory Visit: Payer: Self-pay | Admitting: Internal Medicine

## 2020-05-05 ENCOUNTER — Other Ambulatory Visit: Payer: Self-pay | Admitting: Cardiovascular Disease

## 2020-05-06 ENCOUNTER — Other Ambulatory Visit: Payer: Self-pay

## 2020-05-07 DIAGNOSIS — H6123 Impacted cerumen, bilateral: Secondary | ICD-10-CM | POA: Diagnosis not present

## 2020-05-07 MED ORDER — APIXABAN 5 MG PO TABS: ORAL_TABLET | ORAL | 1 refills | Status: DC

## 2020-05-07 NOTE — Telephone Encounter (Signed)
55m, 92.5kg, scr 0.87(04/18/20), lovw/kelly(03/12/20).

## 2020-05-14 ENCOUNTER — Encounter: Payer: Self-pay | Admitting: Internal Medicine

## 2020-05-14 ENCOUNTER — Inpatient Hospital Stay: Payer: PPO | Admitting: Orthopaedic Surgery

## 2020-05-15 ENCOUNTER — Telehealth: Payer: Self-pay | Admitting: Orthopaedic Surgery

## 2020-05-15 ENCOUNTER — Other Ambulatory Visit: Payer: Self-pay | Admitting: Orthopaedic Surgery

## 2020-05-15 DIAGNOSIS — Z7982 Long term (current) use of aspirin: Secondary | ICD-10-CM | POA: Diagnosis not present

## 2020-05-15 DIAGNOSIS — Z471 Aftercare following joint replacement surgery: Secondary | ICD-10-CM | POA: Diagnosis not present

## 2020-05-15 DIAGNOSIS — Z86010 Personal history of colonic polyps: Secondary | ICD-10-CM | POA: Diagnosis not present

## 2020-05-15 DIAGNOSIS — M170 Bilateral primary osteoarthritis of knee: Secondary | ICD-10-CM | POA: Diagnosis not present

## 2020-05-15 DIAGNOSIS — K219 Gastro-esophageal reflux disease without esophagitis: Secondary | ICD-10-CM | POA: Diagnosis not present

## 2020-05-15 DIAGNOSIS — E785 Hyperlipidemia, unspecified: Secondary | ICD-10-CM | POA: Diagnosis not present

## 2020-05-15 DIAGNOSIS — M25451 Effusion, right hip: Secondary | ICD-10-CM | POA: Diagnosis not present

## 2020-05-15 DIAGNOSIS — M5136 Other intervertebral disc degeneration, lumbar region: Secondary | ICD-10-CM | POA: Diagnosis not present

## 2020-05-15 DIAGNOSIS — F419 Anxiety disorder, unspecified: Secondary | ICD-10-CM | POA: Diagnosis not present

## 2020-05-15 DIAGNOSIS — Z9181 History of falling: Secondary | ICD-10-CM | POA: Diagnosis not present

## 2020-05-15 DIAGNOSIS — G4700 Insomnia, unspecified: Secondary | ICD-10-CM | POA: Diagnosis not present

## 2020-05-15 DIAGNOSIS — Z8711 Personal history of peptic ulcer disease: Secondary | ICD-10-CM | POA: Diagnosis not present

## 2020-05-15 DIAGNOSIS — K5790 Diverticulosis of intestine, part unspecified, without perforation or abscess without bleeding: Secondary | ICD-10-CM | POA: Diagnosis not present

## 2020-05-15 DIAGNOSIS — N400 Enlarged prostate without lower urinary tract symptoms: Secondary | ICD-10-CM | POA: Diagnosis not present

## 2020-05-15 DIAGNOSIS — Z7901 Long term (current) use of anticoagulants: Secondary | ICD-10-CM | POA: Diagnosis not present

## 2020-05-15 DIAGNOSIS — Z96641 Presence of right artificial hip joint: Secondary | ICD-10-CM | POA: Diagnosis not present

## 2020-05-15 DIAGNOSIS — I119 Hypertensive heart disease without heart failure: Secondary | ICD-10-CM | POA: Diagnosis not present

## 2020-05-15 DIAGNOSIS — F32A Depression, unspecified: Secondary | ICD-10-CM | POA: Diagnosis not present

## 2020-05-15 DIAGNOSIS — Z79891 Long term (current) use of opiate analgesic: Secondary | ICD-10-CM | POA: Diagnosis not present

## 2020-05-15 MED ORDER — OXYCODONE HCL 5 MG PO TABS: 5.0000 mg | ORAL_TABLET | Freq: Three times a day (TID) | ORAL | 0 refills | Status: DC | PRN

## 2020-05-15 NOTE — Telephone Encounter (Signed)
Pt would like a refill on his oxycodone!

## 2020-05-16 ENCOUNTER — Other Ambulatory Visit: Payer: Self-pay | Admitting: Cardiovascular Disease

## 2020-05-27 ENCOUNTER — Encounter: Payer: Self-pay | Admitting: Orthopaedic Surgery

## 2020-05-27 ENCOUNTER — Ambulatory Visit (INDEPENDENT_AMBULATORY_CARE_PROVIDER_SITE_OTHER): Payer: PPO | Admitting: Orthopaedic Surgery

## 2020-05-27 DIAGNOSIS — Z96641 Presence of right artificial hip joint: Secondary | ICD-10-CM

## 2020-05-27 NOTE — Progress Notes (Signed)
The patient is now close to 6 weeks status post a right total hip arthroplasty through direct anterior approach.  Postoperatively we did aspirate a seroma from his right hip area at the last visit.  He reports that his mobility is improved quite a bit since his last visit.  He is walking without an assistive device.  He is also been to the beach.  He is 76 years old and very active.  His right hip moves smoothly and.  His incisions healed nicely.  There is no significant seroma or swelling.  He will continue to increase his activities as comfort allows.  I would like to see him back in 6 months.  If there is issues before then he will let us know.  At that next visit I would like a standing low AP pelvis and lateral of his right hip.  All questions and concerns were answered and addressed.

## 2020-06-05 DIAGNOSIS — F419 Anxiety disorder, unspecified: Secondary | ICD-10-CM | POA: Diagnosis not present

## 2020-06-05 DIAGNOSIS — G4700 Insomnia, unspecified: Secondary | ICD-10-CM | POA: Diagnosis not present

## 2020-06-05 DIAGNOSIS — I480 Paroxysmal atrial fibrillation: Secondary | ICD-10-CM | POA: Diagnosis not present

## 2020-06-05 DIAGNOSIS — I2510 Atherosclerotic heart disease of native coronary artery without angina pectoris: Secondary | ICD-10-CM | POA: Diagnosis not present

## 2020-06-05 DIAGNOSIS — I10 Essential (primary) hypertension: Secondary | ICD-10-CM | POA: Diagnosis not present

## 2020-06-05 DIAGNOSIS — E032 Hypothyroidism due to medicaments and other exogenous substances: Secondary | ICD-10-CM | POA: Diagnosis not present

## 2020-06-06 ENCOUNTER — Other Ambulatory Visit: Payer: Self-pay | Admitting: Cardiovascular Disease

## 2020-06-11 ENCOUNTER — Telehealth: Payer: Self-pay

## 2020-06-11 NOTE — Telephone Encounter (Signed)
Patient called he wants to know if he can take golf lessons call [phone]

## 2020-06-11 NOTE — Telephone Encounter (Signed)
Please advise 

## 2020-06-11 NOTE — Telephone Encounter (Signed)
I am fine with him taking golf lessons.

## 2020-06-11 NOTE — Telephone Encounter (Signed)
Called pt and informed

## 2020-06-28 ENCOUNTER — Other Ambulatory Visit: Payer: Self-pay | Admitting: Cardiovascular Disease

## 2020-06-29 NOTE — Telephone Encounter (Signed)
Rx(s) sent to pharmacy electronically.  

## 2020-07-10 ENCOUNTER — Other Ambulatory Visit: Payer: Self-pay | Admitting: Orthopaedic Surgery

## 2020-07-10 ENCOUNTER — Telehealth: Payer: Self-pay | Admitting: Orthopaedic Surgery

## 2020-07-10 MED ORDER — HYDROCODONE-ACETAMINOPHEN 5-325 MG PO TABS: 1.0000 | ORAL_TABLET | Freq: Four times a day (QID) | ORAL | 0 refills | Status: DC | PRN

## 2020-07-10 NOTE — Telephone Encounter (Signed)
Patient called. He would like a refill on oxycodone. His call back number is [phone]

## 2020-07-10 NOTE — Telephone Encounter (Signed)
Patient aware.

## 2020-07-10 NOTE — Telephone Encounter (Signed)
I sent in some Norco instead since he is almost 3 months out from surgery

## 2020-07-16 ENCOUNTER — Ambulatory Visit: Payer: PPO | Admitting: Orthopaedic Surgery

## 2020-07-16 ENCOUNTER — Encounter: Payer: Self-pay | Admitting: Orthopaedic Surgery

## 2020-07-16 ENCOUNTER — Ambulatory Visit: Payer: Self-pay

## 2020-07-16 DIAGNOSIS — Z96641 Presence of right artificial hip joint: Secondary | ICD-10-CM

## 2020-07-16 MED ORDER — OXYCODONE HCL 5 MG PO TABS: 5.0000 mg | ORAL_TABLET | Freq: Two times a day (BID) | ORAL | 0 refills | Status: DC | PRN

## 2020-07-16 NOTE — Progress Notes (Signed)
The patient is a 75 year old gentleman well-known to me.  I replaced his right hip just over 3 months ago.  He was getting off a 3 wheeled motorcycle ride on the back [place] he feels like he pulled muscle or injured something with his right hip.  When he made his appointment he was hurting bad but he said he is getting better.  He says he would like at least 1 more refill of oxycodone and if that would be okay to have on hand.  He is walking without assistive device.  He does not have a limp to his gait today.  Internal and external rotation of the right hip is fluid and pain-free.  An AP pelvis and lateral right hip shows a well-seated implant with no complicating features.  There is otherwise no acute findings on the plain films.  At this point he will slowly increase his activities.  I will have him avoid extremes of rotation and abduction for the next 2 to 4 weeks.  I will send in a one-time prescription for oxycodone to use sparingly.  From my standpoint, I do not need to see him back for 6 months unless there is issues.  We will just have a one-time AP pelvis at that visit.

## 2020-07-22 ENCOUNTER — Encounter: Payer: Self-pay | Admitting: Internal Medicine

## 2020-07-22 ENCOUNTER — Ambulatory Visit (INDEPENDENT_AMBULATORY_CARE_PROVIDER_SITE_OTHER): Payer: PPO | Admitting: Internal Medicine

## 2020-07-22 ENCOUNTER — Telehealth: Payer: Self-pay | Admitting: Cardiovascular Disease

## 2020-07-22 ENCOUNTER — Telehealth: Payer: Self-pay

## 2020-07-22 VITALS — BP 126/58 | HR 54 | Ht 67.0 in | Wt 206.0 lb

## 2020-07-22 DIAGNOSIS — K5730 Diverticulosis of large intestine without perforation or abscess without bleeding: Secondary | ICD-10-CM

## 2020-07-22 DIAGNOSIS — R899 Unspecified abnormal finding in specimens from other organs, systems and tissues: Secondary | ICD-10-CM | POA: Insufficient documentation

## 2020-07-22 DIAGNOSIS — G459 Transient cerebral ischemic attack, unspecified: Secondary | ICD-10-CM | POA: Insufficient documentation

## 2020-07-22 DIAGNOSIS — Z7901 Long term (current) use of anticoagulants: Secondary | ICD-10-CM | POA: Diagnosis not present

## 2020-07-22 DIAGNOSIS — R7301 Impaired fasting glucose: Secondary | ICD-10-CM | POA: Insufficient documentation

## 2020-07-22 DIAGNOSIS — R3129 Other microscopic hematuria: Secondary | ICD-10-CM | POA: Insufficient documentation

## 2020-07-22 DIAGNOSIS — K5732 Diverticulitis of large intestine without perforation or abscess without bleeding: Secondary | ICD-10-CM | POA: Diagnosis not present

## 2020-07-22 DIAGNOSIS — Z86010 Personal history of colonic polyps: Secondary | ICD-10-CM | POA: Diagnosis not present

## 2020-07-22 DIAGNOSIS — I10 Essential (primary) hypertension: Secondary | ICD-10-CM | POA: Insufficient documentation

## 2020-07-22 DIAGNOSIS — E032 Hypothyroidism due to medicaments and other exogenous substances: Secondary | ICD-10-CM | POA: Insufficient documentation

## 2020-07-22 DIAGNOSIS — G4733 Obstructive sleep apnea (adult) (pediatric): Secondary | ICD-10-CM | POA: Insufficient documentation

## 2020-07-22 DIAGNOSIS — G4700 Insomnia, unspecified: Secondary | ICD-10-CM | POA: Insufficient documentation

## 2020-07-22 DIAGNOSIS — D7589 Other specified diseases of blood and blood-forming organs: Secondary | ICD-10-CM | POA: Insufficient documentation

## 2020-07-22 DIAGNOSIS — H409 Unspecified glaucoma: Secondary | ICD-10-CM | POA: Insufficient documentation

## 2020-07-22 DIAGNOSIS — Z8673 Personal history of transient ischemic attack (TIA), and cerebral infarction without residual deficits: Secondary | ICD-10-CM | POA: Insufficient documentation

## 2020-07-22 DIAGNOSIS — Z951 Presence of aortocoronary bypass graft: Secondary | ICD-10-CM | POA: Insufficient documentation

## 2020-07-22 MED ORDER — SUTAB 1479-225-188 MG PO TABS: 1.0000 | ORAL_TABLET | Freq: Once | ORAL | 0 refills | Status: AC

## 2020-07-22 NOTE — Patient Instructions (Signed)
If you are age 76 or older, your body mass index should be between 23-30. Your Body mass index is 32.26 kg/m. If this is out of the aforementioned range listed, please consider follow up with your Primary Care Provider.  If you are age 6 or younger, your body mass index should be between 19-25. Your Body mass index is 32.26 kg/m. If this is out of the aformentioned range listed, please consider follow up with your Primary Care Provider.   __________________________________________________________  The [facility] providers would like to encourage you to use [facility] to communicate with providers for non-urgent requests or questions.  Due to long hold times on the telephone, sending your provider a message by [facility] may be a faster and more efficient way to get a response.  Please allow 48 business hours for a response.  Please remember that this is for non-urgent requests.   You have been scheduled for a colonoscopy. Please follow written instructions given to you at your visit today.  Please pick up your prep supplies at the pharmacy within the next 1-3 days. If you use inhalers (even only as needed), please bring them with you on the day of your procedure.

## 2020-07-22 NOTE — Telephone Encounter (Signed)
Patient scheduled 6/27 at 10:40 AM, patient aware.

## 2020-07-22 NOTE — Progress Notes (Signed)
HISTORY OF PRESENT ILLNESS:  Carl [name] is a 75 y.o. male with multiple medical problems as listed below including atrial fibrillation for which he is on chronic Eliquis therapy.  Patient has been seen in this office for problems with recurrent acute diverticulitis as well as colon polyp surveillance for history of multiple adenomatous colon polyps.  He was last seen in the office May 23, 2019 after responding to antibiotic therapy for a bout of acute diverticulitis.  He presents today regarding surveillance colonoscopy.  No further problems with his diverticular disease.  He also has a history of GERD and peptic ulcer disease for which he was maintained on PPI therapy.  His cardiologist is Dr. Ellouise Newer.  Last upper endoscopy June 2019.  Last complete colonoscopy December 2016.  Review of blood work from March 2022 shows hemoglobin 12.8.  Last CT scan demonstrated mild sigmoid diverticulitis with May 2019 (reviewed).  He is pleased to report a 62-month-old granddaughter and the upcoming wedding of his daughter.  REVIEW OF SYSTEMS:  All non-GI ROS negative unless otherwise stated in the HPI except for fatigue, hearing problems  Past Medical History:  Diagnosis Date   Anxiety    takes Xanax daily as needed   Arthritis    Cataract    removed both eyes   Depression    takes Celexa daily   Diverticulitis    Diverticulosis    Enlarged prostate    sightly   Esophagitis    Gastric ulcer    GERD (gastroesophageal reflux disease)    takes Protonix daily   Hx of adenomatous colonic polyps    benign   Hyperlipidemia    takes Zocor daily   Insomnia    takes Ambien nightly   Joint pain    Joint swelling     Past Surgical History:  Procedure Laterality Date   A-FLUTTER ABLATION N/A 01/11/2019   Procedure: A-FLUTTER ABLATION;  Surgeon: Evans Lance, MD;  Location: [facility];  Service: Cardiovascular;  Laterality: N/A;   CARDIAC CATHETERIZATION     CARDIOVERSION N/A 11/20/2018    Procedure: CARDIOVERSION;  Surgeon: Troy Sine, MD;  Location: [facility];  Service: Cardiovascular;  Laterality: N/A;   cartliage removed from nose  57yrs ago   cataract surgery Bilateral    CERVICAL SPINE SURGERY     COLONOSCOPY     CORONARY ARTERY BYPASS GRAFT N/A 11/16/2017   Procedure: CORONARY ARTERY BYPASS GRAFTING (CABG) x4. LEFT ENDOSCOPIC SAPHENOUS VEIN HARVEST AND MAMMARY ARTERY TAKE DOWN. LIMA TO LAD, SVG TO PDA, SVG TO DISTAL CIRC & OMI.;  Surgeon: [name], MD;  Location: [facility];  Service: Open Heart Surgery;  Laterality: N/A;   EYE SURGERY     FOREIGN BODY REMOVAL ESOPHAGEAL     KNEE SURGERY Left    couple of times   LEFT HEART CATH AND CORONARY ANGIOGRAPHY N/A 11/15/2017   Procedure: LEFT HEART CATH AND CORONARY ANGIOGRAPHY;  Surgeon: Troy Sine, MD;  Location: [facility];  Service: Cardiovascular;  Laterality: N/A;   NASAL SINUS SURGERY     POLYPECTOMY     TEE WITHOUT CARDIOVERSION N/A 11/16/2017   Procedure: TRANSESOPHAGEAL ECHOCARDIOGRAM (TEE);  Surgeon: [name], MD;  Location: [facility];  Service: Open Heart Surgery;  Laterality: N/A;   TONSILLECTOMY     TOTAL HIP ARTHROPLASTY Right 04/17/2020   Procedure: RIGHT TOTAL HIP ARTHROPLASTY ANTERIOR APPROACH;  Surgeon: Mcarthur Rossetti, MD;  Location: [facility];  Service: Orthopedics;  Laterality: Right;   TOTAL KNEE ARTHROPLASTY Left 06/17/2014   Procedure: TOTAL KNEE ARTHROPLASTY;  Surgeon: Garald Balding, MD;  Location: [facility];  Service: Orthopedics;  Laterality: Left;    Social History Carl [name]  reports that he has never smoked. He has never used smokeless tobacco. He reports current alcohol use of about 10.0 standard drinks of alcohol per week. He reports that he does not use drugs.  family history includes Colon cancer in his paternal grandfather.  No Known Allergies     PHYSICAL EXAMINATION: Vital signs: BP (!) 126/58   Pulse (!) 54   Ht 5\' 7"  (1.702 m)   Wt 206 lb  (93.4 kg)   SpO2 97%   BMI 32.26 kg/m   Constitutional: generally well-appearing, no acute distress Psychiatric: alert and oriented x3, cooperative Eyes: extraocular movements intact, anicteric, conjunctiva pink Ears: Hearing aid Mouth: oral pharynx moist, no lesions Neck: supple no lymphadenopathy Cardiovascular: heart regular rate and rhythm, no murmur Lungs: clear to auscultation bilaterally Abdomen: soft, nontender, nondistended, no obvious ascites, no peritoneal signs, normal bowel sounds, no organomegaly Rectal: Deferred until colonoscopy Extremities: no clubbing, cyanosis, or lower extremity edema bilaterally Skin: no lesions on visible extremities Neuro: No focal deficits.  Cranial nerves intact  ASSESSMENT:  1.  History of multiple adenomatous colon polyps.  Due for surveillance 2.  History of GERD and peptic ulcer disease.  Asymptomatic on chronic PPI 3.  History of recurrent acute diverticulitis.  Currently asymptomatic 4.  Multiple significant medical problems including history of atrial fibrillation for which he is on chronic Eliquis therapy.   PLAN:  1.  Schedule colonoscopy.  PEDIATRIC SCOPE.  The patient is high risk given his comorbidities and the need to address chronic anticoagulation.The nature of the procedure, as well as the risks, benefits, and alternatives were carefully and thoroughly reviewed with the patient. Ample time for discussion and questions allowed. The patient understood, was satisfied, and agreed to proceed.  2.  Hold Eliquis for 2 days prior to the procedure.  Check with [name] to see if this is acceptable.  Patient did hold anticoagulation therapy for his hip surgery in March 3.  Reflux precautions 4.  Continue daily PPI in the form of pantoprazole 5.  Ongoing general medical care with PCP and other specialists

## 2020-07-22 NOTE — Telephone Encounter (Signed)
[facility] Pre-operative Risk Assessment     Request for surgical clearance:     Endoscopy Procedure  What type of surgery is being performed?     Colonoscopy  When is this surgery scheduled?     08/26/2020  What type of clearance is required ?   Pharmacy  Are there any medications that need to be held prior to surgery and how long? Eliquis - 2 days  Practice name and name of physician performing surgery?      [facility]  What is your office phone and fax number?      Phone- [phone]  [phone]  Anesthesia type (None, local, MAC, general) ?       MAC

## 2020-07-22 NOTE — Telephone Encounter (Signed)
Patient with diagnosis of a fib on eliquis for anticoagulation.    Procedure: endoscopy Date of procedure: 08/26/20   CHA2DS2-VASc Score = 6  This indicates a 9.7% annual risk of stroke. The patient's score is based upon: CHF History: No HTN History: Yes Diabetes History: No Stroke History: Yes Vascular Disease History: Yes Age Score: 2 Gender Score: 0   CrCl 95 mL/min Platelet count 211  Due to patient's history of possible TIA, will send to Dr Claiborne Billings for input

## 2020-07-22 NOTE — Telephone Encounter (Signed)
Got a message from Mariann Laster that this pt is a friend of Dr. Evette Georges and would like to schedule an appt. I called pt and he stated that he has been feeling really tired and fatigued lately and not sure if he needs to be seen or have some blood work done. Patient denies any other symptoms. Dr. Evette Georges next available is the end of October, he would like to be seen sooner. Please advise.

## 2020-07-24 NOTE — Telephone Encounter (Signed)
Reviewed chart with [name].  Patient okay to hold Eliquis x 2 days for colonoscopy

## 2020-07-24 NOTE — Telephone Encounter (Signed)
Spoke with patient and told him he could hold his Eliquis for 2 days prior to his procedure.  Patient agreed.

## 2020-07-28 DIAGNOSIS — R21 Rash and other nonspecific skin eruption: Secondary | ICD-10-CM | POA: Diagnosis not present

## 2020-08-03 ENCOUNTER — Ambulatory Visit: Payer: PPO | Admitting: Cardiovascular Disease

## 2020-08-03 ENCOUNTER — Encounter: Payer: Self-pay | Admitting: Cardiovascular Disease

## 2020-08-03 ENCOUNTER — Other Ambulatory Visit: Payer: Self-pay

## 2020-08-03 VITALS — BP 130/70 | HR 47 | Resp 18 | Ht 67.0 in | Wt 202.6 lb

## 2020-08-03 DIAGNOSIS — Z8679 Personal history of other diseases of the circulatory system: Secondary | ICD-10-CM

## 2020-08-03 DIAGNOSIS — E785 Hyperlipidemia, unspecified: Secondary | ICD-10-CM | POA: Diagnosis not present

## 2020-08-03 DIAGNOSIS — I2510 Atherosclerotic heart disease of native coronary artery without angina pectoris: Secondary | ICD-10-CM

## 2020-08-03 DIAGNOSIS — G4733 Obstructive sleep apnea (adult) (pediatric): Secondary | ICD-10-CM | POA: Diagnosis not present

## 2020-08-03 DIAGNOSIS — Z951 Presence of aortocoronary bypass graft: Secondary | ICD-10-CM

## 2020-08-03 DIAGNOSIS — Z9889 Other specified postprocedural states: Secondary | ICD-10-CM | POA: Diagnosis not present

## 2020-08-03 DIAGNOSIS — I483 Typical atrial flutter: Secondary | ICD-10-CM | POA: Diagnosis not present

## 2020-08-03 DIAGNOSIS — E039 Hypothyroidism, unspecified: Secondary | ICD-10-CM

## 2020-08-03 NOTE — Progress Notes (Signed)
Cardiology Office Note    Date:  08/03/2020   ID:  Carl [name], DOB Jul 05, 1945, MRN [id number]  PCP:  Carl Fess, MD  Cardiologist:  Carl Majestic, MD    Follow-up evaluation   History of Present Illness:  Carl [name] is a 75 y.o. male who presents to the office today for an 41/2 month follow-up evaluation.  He had called the office a week and a half ago complaining of significant increase in fatigability and was worked into my schedule.  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  In 2009 he developed transient numbness of his right face which lasted for several minutes and at that time apparently underwent evaluation and had a normal MRI, MRA, and carotid duplex studies.  He was found to have only mild to moderate plaque in the proximal right internal carotid artery without stenosis.  I had seen him in March 2016 for preoperative cardiology clearance prior to undergoing left knee replacement surgery by Dr. Durward [name].  Preoperative ECG suggested possible junctional rhythm which was new from an ECG of 2012 which previously had only shown sinus bradycardia.  He was asymptomatic.  When I saw him, his ECG showed sinus rhythm at 61 bpm with normal intervals and on that ECG P waves were normal and upright inferiorly.  Carl [name] has remained fairly active. In 2019  he had not been exercising as much as he had in the past still working out with a trainer and denied any associated chest pain or palpitations.  He in September 201 19 he began to notice more shortness of breath with walking up steps.  He was  at a friend's house and was told that he appeared to be more short of breath than previously.  I saw him for evaluation of his exertional shortness of breath on October 24, 2017.  At that time, I recommended that he undergo a 2D echo Doppler study and scheduled him for coronary CT angiography.  His echo Doppler study demonstrated  hyperdynamic LV function with an EF of 65 to 70%.  Doppler parameters suggest grade 2 diastolic dysfunction and elevated ventricular end-diastolic filling pressure.  He had mild MR, mild TR, and mild dilation of his left atrium.  Pulmonary pressures were normal.  CT coronary angiography was performed on November 09, 2017.  This was abnormal and demonstrated an elevated calcium score a 25 which is 78th percentile for age and sex.  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  There was greater than 75% calcific plaque in his proximal and mid diagonal 1 vessel.  The circumflex had 50% proximal plaque.  There was 50 to 75% mixed plaque in the OM 2 vessel less than 50% in the OM1 vessel.  His RCA had 50% calcified plaque proximally, 50 to 75% calcified plaque in the mid vessel.  There was mild aortic root dilatation at 4.1 cm.  His CT images were referred for Carl [name] analysis which were positive in the mid RCA 0.78, the mid LAD 0.78 and in the AV groove circumflex at 0.71.   I saw him in follow-up of the above studies and recommended cardiac catheterization.  Catheterization was done in November 15, 2017 which showed severe multivessel CAD with 85% ostial LAD stenosis, diffuse 75% mid LAD stenosis, total occlusion of the mid AV  groove circumflex after the takeoff of the second obtuse marginal vessel with 50% diffuse stenosis in the first large marginal branch, 80 and 90% stenoses in the second obtuse marginal vessel and extensive collateralization to the distal circumflex and third marginal via the LAD.  He had 1650% mid RCA stenoses and a dominant RCA.  Of note, he had probable high right radial artery takeoff with spasm/coarse stenosis above the elbow and there was retrograde filling of the brachial artery and the catheterization was transition to the femoral approach.  He underwent successful CABG  revascularization surgery the following day by Dr. Roxy [name] on November 16, 2017 with a LIMA graft to his LAD, SVG to first marginal and distal circumflex, and SVG to the PDA of his RCA with left thigh and calf endoscopic vein harvest.  He developed atrial fibrillation with RVR on postop day 2 and was started on IV amiodarone.  He ultimately converted back to sinus rhythm but developed recurrent AF on postop day 4 on metoprolol and amiodarone and ultimately converted back to sinus rhythm.  He was started on Eliquis on day 5 for anticoagulation.  He was discharged on November 22, 2017.  Approximately 10 days later he started to develop left lower extremity swelling and redness with pain in the back of his calf.  He was evaluated by Carl Handler, PA-C at [facility] on December 04, 2017.  There was no sign of infection.  Venous duplex imaging did not reveal any DVT.  He saw Dr. Servando [name] for initial follow-up evaluation on December 14, 2017 at which time he was maintaining sinus rhythm.  He will be participating in cardiac rehab and his orientation is scheduled for February 15, 2018.  He denied any recurrent chest pain.  His breathing has improved with walking.  He went to the beach and was walking at least a mile per day.  He is sleeping better and snoring is less.    Since his November 2019 and prior to his February 2020 evaluation he was doing well and was  participating in cardiac rehabilitation and also had joined a gym. He was exercising regularly.  He denied chest pain, PND, or orthopnea.  Subsequent laboratory showed further TSH elevation.  His levothyroxine was increased to 75 mcg.  Amiodarone was discontinued due to LFT elevation and his dose of rosuvastatin was reduced to one half of the current dose.  I  saw him in February 2020 at that time he had not had follow-up laboratory.    He had had follow-up laboratory which continued to show hypothyroidism and his dose of levothyroxine has gradually been increased.  Recent  laboratory done by Dr. Rex [name] several months ago continues to show a TSH was elevated and his levothyroxine dose was increased from 100 to 125 mcg.  During the COVID-19 pandemic, he admits to weight gain.  He has not been exercising as much as he had in the past.  Although he felt well, when recently seen by Dr. Durward [name] he was felt to potentially be more short of breath.  He was advised to see me back for follow-up evaluation.  I scheduled him to undergo repeat laboratory which was done on October 05, 2018.  Hemoglobin 15.4, hematocrit 44.2.  Lipid studies were significantly improved from 6 months previously with total cholesterol now 143, triglycerides 142, HDL 48, LDL 67.  Brain natruretic peptide was 131.  TSH had significantly improved but was still elevated at 15.9.  He had normal renal function with a  BUN of 15 creatinine 0.96.  LFTs were significantly improved but minimally increased with an AST of 46 and ALT at 49.  He denied any chest pain or awareness of palpitations.  I saw him for reevaluation on October 08, 2018.  During that evaluation, I reviewed his laboratory and felt he was euvolemic.  His ECG showed normal sinus rhythm at 60 bpm with an isolated PVC.  His QTc interval was 434 ms.  With his continued TSH elevation, levothyroxine was further titrated to 150 mcg.  Subsequently, Carl [name] has continued to feel well.  He denied any awareness of palpitations.  Last week, in October 29, 2018 when checking his Apple watch he saw report that his heart rate was increased in the upper 130s.  He was unaware of this irregularity.  Upon further questioning, he had to go out of town over the last several days and believes he may have missed taking his morning metoprolol dose on Friday and Saturday and he could not remember if he had taken it yesterday morning.  He was supposed to be taken metoprolol tartrate 25 mg twice a day, HCTZ on a as needed basis which he has not needed.  He continues to be on Eliquis 5 mg  twice a day and is tolerated the levothyroxine 150 mcg dose.  He is now on the vascepa which was just renewed last week 2 capsules twice a day, rosuvastatin 20 mg daily for mixed hyperlipidemia.  After receiving his phone call, I advised that he come to the office to check an EKG.  With his ECG showing atrial flutter at a rate of 130 he was added onto my schedule for further evaluation.  During that evaluation, he was asymptomatic and felt well.  His ECG showed atrial flutter with variable block at 130 bpm with nonspecific ST-T changes.  He was on Eliquis for anticoagulation.  I recommended titration of metoprolol tartrate to 50 mg twice a day.  Laboratory drawn that day revealed a TSH 5.95, magnesium 2.3, potassium 4.7, BUN 14 creatinine 0.9.   I saw him on September 28 for follow-up evaluation.  He was feeling well and continue to be unaware of his heart rate irregularity.  However according to his Apple Watch, his heart rate was running in the 115-120 range   He denied chest pain.  He denied tremors.  He was sleeping well and denied any awareness of sleep apnea.  During that evaluation we discussed gradually him for DC cardioversion but this would require COVID-19 testing with quarantine for 3 days prior to having the procedure done.  His daughter was coming into town that weekend.  As result we further titrated his metoprolol to 75 mg twice a day which he did for 4 days and then increase to 200 mg twice a day.  He continues to be on Eliquis for anticoagulation.  He admits to increased fatigue but denies chest pain.   I saw Carl [name] in follow-up on November 15, 2018.  At that time he had titrated the metoprolol up to 100 mg twice a day.  He underwent successful DC cardioversion on November 20, 2018 with restoration of sinus rhythm.  Of note, he developed hypotension prior to awakening from propofol and received normal saline and Neo-Synephrine.  Blood pressure normalized loud snoring was noted, and it was advised to  consider sleep study evaluation.  Over the month after his cardioversion Carl [name] initially did well but he inadvertently reduced his metoprolol from I suggested dose reduction  to 75 mg twice a day and apparently has only been taking 25 mg twice a day.  In addition, he has had difficulty with sinus congestion and was taking medication with pseudoephedrine.  He called the office on 12/20/18  stating that his Apple Watch had noted that his heart rate had been increased for almost a week and when I spoke with him his resting pulse was 135.  At that time I recommended he increase metoprolol to 50 mg twice a day and see me on the following day on December 21, 2018.  During that evaluation, he remained in atrial flutter with variable block at a rate of 115 bpm.  Recommended titration of metoprolol tartrate to 100 mg twice a day.  I discussed EP evaluation with consideration for atrial flutter ablation.  I again will had a lengthy discussion regarding the need for a sleep study due to high likelihood of obstructive sleep apnea.  He initially preferred trying the metoprolol increased dose to see if this would benefit him prior to an immediate EP evaluation.   I saw him in follow-up on the increase metoprolol 100 mg grams twice a day dosing at that time he was in 2-1 flutter with ventricular rate at 130.  I strongly recommended EP evaluation with atrial flutter ablation and discussed the case with Dr. Lovena Le who agreed to see the patient in the office on December 3 and plan to do atrial flutter ablation on December 4.  Since his sleep study was tentatively scheduled for December 2 I recommended that this be deferred until after his ablation.   He underwent successful ablation of his atrial flutter on January 11, 2019 which was isthmus-dependent right atrial flutter upon presentation to the EP lab.  He was able to be successfully treated and was discharged later that evening in sinus rhythm.  I saw him on January 16, 2019  after he called the office the day previously complaining of some shortness of breath.  He again called the office this morning stating that he was more short of breath, very fatigued and having no energy.  As result I added him onto my schedule  for further evaluation.  He denied any chest pain and was unaware of any palpitations; per his apple watch monitoring his resting pulse yesterday was 56 and today 57.  During that evaluation, I recommended that he undergo a chest x-ray, follow-up 2D echo Doppler study, and scheduled him for [place] study to make certain his dyspnea was not ischemia mediated.  His echo Doppler study showed EF at 60 to 65% with mild LVH.  There was abnormal septal motion consistent with his postoperative state.  There was grade 2 diastolic dysfunction.  He had severe biatrial enlargement.  A chest x-ray did not reveal any active disease.  A Lexiscan Myoview study was low risk without ischemia with nuclear stress EF at 60%.  He underwent his sleep study on January 25, 2019.  Apparently several days prior to his sleep study he apparently again ran out of his metoprolol.  Of note during the sleep study his ECG revealed that he was in atrial fibrillation.  He was found to have severe obstructive sleep apnea in a split-night protocol with an AHI of 63.5/h.  CPAP was initiated but due to continued events was transitioned to BiPAP therapy and he was ultimately titrated to 18/14.  AHI at this level was 3.6 with oxygen nadir 93%.    I saw him for follow-up on  February 14, 2019.  At that time he had resumed metoprolol.  His set up date for BiPAP was February 04, 2019.  Over the past week and a half on therapy he has noticed significant improvement in his previous fatigability.  A download was obtained since his set up date and he was 100% compliant since December 28.  He had a mask leak.  At 18/14 BiPAP pressure AHI was improved but still increased at 6.3.  There were no central events.     Cleto was reevaluated by Dr. Lovena Le on February 19, 2019.  He was doing well and maintaining sinus rhythm.  Apparently was recommended that he reduce his metoprolol to 50 twice a day but apparently this has not been done.  I saw him on March 18 for his initial initial sleep clinic evaluation.  Of note, a download from January 8 through March 05, 2019 shows that he is meeting compliance standards with 84% of usage days and averaging 6 hours and 20 minutes.  AHI was 7.9 and his BiPAP device was set at a minimum EPAP of 12 with maximum IPAP of 22.  Apparently over the past month, Carl [name] states that he has lost 10 pounds by essentially giving up bread and reducing carbohydrates.  He believes with his weight loss he was not needing CPAP.  He had issues with mask leak and apparently went to get a new mask which was a ResMed air fit F 30i from [facility].  Apparently, he never adjusted the straps to fit his head and as result this resulted in significant leakage and consequently for the last 30 days he has only used CPAP for 5 days and only for 2 hours and 6 minutes on average.  AHI was 9.6 /h.  During that evaluation I had a lengthy discussion with him again stressing the importance of increased incidence of recurrent atrial fibrillation with his severe sleep apnea if left untreated.  He was not wearing his mask appropriately and we spent significant time education and instruction on how to place the mask on to fit well without significant leak.  At that time I reduced his metoprolol down to 75 mg in the morning and 50 mg at night.  I saw him in June 2021 during the prior several months he had felt well.  Over the past several months he has felt well.  Again there were times where he was not typically compliant with BiPAP use but over the past several weeks prior to his evaluation he had been using it more regularly and has noticed more energy.  Several weeks ago when he was not using it he did experience  significant fatigability.  He is unaware of any breakthrough atrial fibrillation or flutter.  He denies recurrent anginal symptoms.  I obtained a new download  from May 25 through July 31, 2019.  This revealed reduced compliance with 19 out of 30 days but over the past several weeks he has been much more consistent with CPAP use and only missed 2 days.  At his minimum EPAP pressure of 10 and maximum pressure of 25 AHI was increased at 11.  His 95th percentile pressure was 17.1/13.1 with a maximum average pressure 18.1/14.1.  When I last saw Carl [name] on March 12, 2020 he was no longer dating his prior girlfriend.  When they were together she was playing a major role in his BiPAP compliance .  Apparently, Carl [name] stopped using BiPAP therapy and since September 2021 has  only used on several occasions with approximately 8 days in October 1 day November and 1 day in December.  He was unaware of any breakthrough palpitations or atrial flutter.  He denies chest pain or shortness of breath.  He denies any bleeding on Eliquis.  He admits to weight gain.  He is resuming exercise.  He would still like to undergo surgery to his right eyelid which continuously droops and inhibits his breathing capabilities.   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  Surgery was done by Dr. Ninfa Linden.  Reice tolerated surgery well without cardiovascular issues.  Several weeks ago, he began to notice significantly increased fatigability.  He was no longer using BiPAP.  He went to [name] to his beach house for a week and ultimately has felt significantly improved.  He also is now dating another woman and he believes this has significantly invigorated him.  He is unaware of any recurrent anginal symptoms.  He is now been on over-the-counter omega-3 fatty acid instead of his previous Vascepa.  He continues to  be on Eliquis for anticoagulation without bleeding and continues to take low-dose aspirin with his CAD.  He is on levothyroxine 150 mcg for hypothyroidism, metoprolol 50 mg twice a day and is unaware of any recurrent atrial flutter or fibrillation.  He is on rosuvastatin 20 mg daily.  He presents for evaluation.  Past Medical History:  Diagnosis Date   Anxiety    takes Xanax daily as needed   Arthritis    Cataract    removed both eyes   Depression    takes Celexa daily   Diverticulitis    Diverticulosis    Enlarged prostate    sightly   Esophagitis    Gastric ulcer    GERD (gastroesophageal reflux disease)    takes Protonix daily   Hx of adenomatous colonic polyps    benign   Hyperlipidemia    takes Zocor daily   Insomnia    takes Ambien nightly   Joint pain    Joint swelling     Past Surgical History:  Procedure Laterality Date   A-FLUTTER ABLATION N/A 01/11/2019   Procedure: A-FLUTTER ABLATION;  Surgeon: Evans Lance, MD;  Location: [place] [facility];  Service: Cardiovascular;  Laterality: N/A;   CARDIAC CATHETERIZATION     CARDIOVERSION N/A 11/20/2018   Procedure: CARDIOVERSION;  Surgeon: Troy Sine, MD;  Location: [name];  Service: Cardiovascular;  Laterality: N/A;   cartliage removed from nose  34yr ago   cataract surgery Bilateral    CERVICAL SPINE SURGERY     COLONOSCOPY     CORONARY ARTERY BYPASS GRAFT N/A 11/16/2017   Procedure: CORONARY ARTERY BYPASS GRAFTING (CABG) x4. LEFT ENDOSCOPIC SAPHENOUS VEIN HARVEST AND MAMMARY ARTERY TAKE DOWN. LIMA TO LAD, SVG TO PDA, SVG TO DISTAL CIRC & OMI.;  Surgeon: GGrace Isaac MD;  [facility]  Service: Open Heart Surgery;  Laterality: N/A;   EYE SURGERY     FOREIGN BODY REMOVAL ESOPHAGEAL     KNEE SURGERY Left    couple of times   LEFT HEART CATH AND CORONARY ANGIOGRAPHY N/A 11/15/2017   Procedure: LEFT HEART CATH AND CORONARY ANGIOGRAPHY;  Surgeon: KTroy Sine MD;  [facility];  Service: Cardiovascular;  Laterality: N/A;   NASAL SINUS SURGERY  POLYPECTOMY     TEE WITHOUT CARDIOVERSION N/A 11/16/2017   Procedure: TRANSESOPHAGEAL ECHOCARDIOGRAM (TEE);  Surgeon: [name], MD;  Location: [facility];  Service: Open Heart Surgery;  Laterality: N/A;   TONSILLECTOMY     TOTAL HIP ARTHROPLASTY Right 04/17/2020   Procedure: RIGHT TOTAL HIP ARTHROPLASTY ANTERIOR APPROACH;  Surgeon: Mcarthur Rossetti, MD;  Location: [facility];  Service: Orthopedics;  Laterality: Right;   TOTAL KNEE ARTHROPLASTY Left 06/17/2014   Procedure: TOTAL KNEE ARTHROPLASTY;  Surgeon: Garald Balding, MD;  Location: [facility];  Service: Orthopedics;  Laterality: Left;    Current Medications: Outpatient Medications Prior to Visit  Medication Sig Dispense Refill   acetaminophen (TYLENOL) 500 MG tablet Take 1,000 mg by mouth every 6 (six) hours as needed for moderate pain or headache.     acyclovir (ZOVIRAX) 400 MG tablet Take 400 mg by mouth 2 (two) times daily.     apixaban (ELIQUIS) 5 MG TABS tablet TAKE 1 TABLET(5 MG) BY MOUTH TWICE DAILY 180 tablet 1   aspirin EC 81 MG tablet Take 1 tablet (81 mg total) by mouth daily. 90 tablet 3   citalopram (CELEXA) 20 MG tablet Take 20 mg by mouth daily.     ferrous sulfate 325 (65 FE) MG tablet Take 325 mg by mouth daily.     icosapent Ethyl (VASCEPA) 1 g capsule TAKE 2 CAPSULES(2 GRAMS) BY MOUTH TWICE DAILY 360 capsule 1   levothyroxine (SYNTHROID) 150 MCG tablet TAKE 1 TABLET(150 MCG) BY MOUTH DAILY 90 tablet 2   methocarbamol (ROBAXIN) 500 MG tablet Take 1 tablet (500 mg total) by mouth every 6 (six) hours as needed for muscle spasms. 40 tablet 1   metoprolol tartrate (LOPRESSOR) 50 MG tablet Take 1 tablet (50 mg total) by mouth 2 (two) times daily. 180 tablet 3   Multiple Vitamins-Minerals (PRESERVISION AREDS 2 PO) Take 1 tablet by mouth 2 (two) times daily.     nitroGLYCERIN (NITROSTAT) 0.4 MG SL tablet DISSOLVE 1 TABLET UNDER THE TONGUE EVERY 5  MINUTES AS NEEDED FOR CHEST PAIN 25 tablet 2   oxyCODONE (ROXICODONE) 5 MG immediate release tablet Take 1 tablet (5 mg total) by mouth 2 (two) times daily as needed for severe pain. 30 tablet 0   oxymetazoline (AFRIN) 0.05 % nasal spray Place 1 spray into both nostrils 2 (two) times daily as needed for congestion.     pantoprazole (PROTONIX) 20 MG tablet TAKE 2 TABLETS(40 MG) BY MOUTH DAILY 180 tablet 1   PRESCRIPTION MEDICATION Apply 1 application topically daily as needed (dark spots). FLUOROURACIL 5% + CALCIPOTRIENE 0.005%     rosuvastatin (CRESTOR) 20 MG tablet Take 1 tablet (20 mg total) by mouth daily. 90 tablet 2   zolpidem (AMBIEN) 10 MG tablet Take 10 mg by mouth at bedtime.     No facility-administered medications prior to visit.     Allergies:   Patient has no known allergies.   Social History   Socioeconomic History   Marital status: Single    Spouse name: Not on file   Number of children: 3   Years of education: Not on file   Highest education level: Not on file  Occupational History   Occupation: Owner  Tobacco Use   Smoking status: Never   Smokeless tobacco: Never  Vaping Use   Vaping Use: Never used  Substance and Sexual Activity   Alcohol use: Yes    Alcohol/week: 10.0 standard drinks    Types: 10 Standard drinks  or equivalent per week    Comment: 2 drinks 5 nights per week   Drug use: No   Sexual activity: Never  Other Topics Concern   Not on file  Social History Narrative   Not on file   Social Determinants of Health   Financial Resource Strain: Not on file  Food Insecurity: Not on file  Transportation Needs: Not on file  Physical Activity: Not on file  Stress: Not on file  Social Connections: Not on file    Additional social history is notable in that he is divorced x2.  He has 3 children and his oldest son lives in [place].   His second son  had lived [place] and was married in [place] and [place] he moved back to the [place] area.  His  daughter works in [place] as a Government social research officer in a Web designer.  There is no tobacco use.  He drinks alcohol.  Family History:  The patient's family history includes Colon cancer in his paternal grandfather.  Mother died at age 72 with emphysema.  His father died at 18.  He has 2 sisters ages 18 and 73.  ROS General: Negative; No fevers, chills, or night sweats; positive for fatigue HEENT: Decreased hearing with hearing aid in right ear, no sinus congestion, difficulty swallowing; right eyelid droop Pulmonary: Negative; No cough, wheezing, shortness of breath, hemoptysis Cardiovascular: See HPI GI: History of diverticular disease and colonic polyps. GU: Remote history of genital herpes; erectile dysfunction Musculoskeletal: Negative; no myalgias, joint pain, or weakness Hematologic/Oncology: Negative; no easy bruising, bleeding Endocrine: Positive for hypothyroidism Neuro: Negative; no changes in balance, headaches Skin: Negative; No rashes or skin lesions Psychiatric:  Sleep: Positive snoring snoring, previous fatigue; documented to have severe OSA with recommended treatment with BiPAP therapy; no bruxism, restless legs, hypnogognic hallucinations, no cataplexy Other comprehensive 14 point system review is negative.   PHYSICAL EXAM:   VS:  BP 130/70 (BP Location: Left Arm, Patient Position: Sitting, Cuff Size: Normal)   Pulse (!) 47   Resp 18   Ht 5' 7" (1.702 m)   Wt 202 lb 9.6 oz (91.9 kg)   SpO2 97%   BMI 31.73 kg/m     Repeat blood pressure by me was 136/70 , Wt Readings from Last 3 Encounters:  08/03/20 202 lb 9.6 oz (91.9 kg)  07/22/20 206 lb (93.4 kg)  04/17/20 192 lb (87.1 kg)    General: Alert, oriented, no distress.  Skin: normal turgor, no rashes, warm and dry HEENT: Normocephalic, atraumatic. Pupils equal round and reactive to light; sclera anicteric; extraocular muscles intact;  Nose without nasal septal hypertrophy Mouth/Parynx benign; Mallinpatti  scale 3 Neck: No JVD, no carotid bruits; normal carotid upstroke Lungs: clear to ausculatation and percussion; no wheezing or rales Chest wall: without tenderness to palpitation Heart: PMI not displaced, RRR, s1 s2 normal, 1/6 systolic murmur, no diastolic murmur, no rubs, gallops, thrills, or heaves Abdomen: soft, nontender; no hepatosplenomehaly, BS+; abdominal aorta nontender and not dilated by palpation. Back: no CVA tenderness Pulses 2+ Musculoskeletal: full range of motion, normal strength, no joint deformities Extremities: no clubbing cyanosis or edema, Homan's sign negative  Neurologic: grossly nonfocal; Cranial nerves grossly wnl Psychologic: Normal mood and affect    Studies/Labs Reviewed:   ECG (independently read by me):  Sinus bradycadia at 47; no ectopy    March 12, 2020 ECG (independently read by me): Sinus bradycardia 51 bpm. Possible left atrial enlargement. VH. No ectopy.  Normal intervals.  June 2021 ECG (independently read by me):Sinus bradycardia at 53; QTc 437 msec  March 18, 2021ECG (independently read by me):Sinus bradycardia at 48; QTc 441 msec; No ST changes; no ectopy  January 7, 2021ECG (independently read by me): Sinus Bradycardia at 51; no STT changes; QTc 442 msec; no ectopy  January 16, 2019 ECG (independently read by me): Sinus bradycardia at 57 bpm with possible left atrial enlargement.  PR interval 176 ms, QTc interval 476 ms.  No ST segment changes.  January 02, 2019 ECG (independently read by me): Atrial flutter with 2-1 AV conduction with a ventricular rate of 130 bpm.  December 21, 2018 ECG (independently read by me): Atrial flutter with variable block at 115, QT 402,QTC 556 msec  Cardioversion November 20, 2018 with restoration of sinus rhythm.  November 15, 2018 ECG (independently read by me): Atrial Flutter with variable block at 97 bpm  September 2020 ECG (independently read by me): Atrial flutter with variable block with an average heart  rate at 115 bpm  October 29, 2018 ECG (independently read by me): Atrial flutter with variable block 130 bpm.  No specific ST-T changes  October 08, 2018 ECG (independently read by me): NSR at 60; isolated PVC; QTc 434 msec.  April 02, 2018 ECG (independently read by me): NSR 62; Nonspecific T change; QTc 452 msec  December 29, 2017 ECG (independently read by me): Sinus bradycardia at 49 bpm.  Nonspecific ST changes.  QTc interval 464 ms.  No ectopy.  ECG (independently read by me): Sinus rhythm at 64 bpm.  Right bundle branch block with repolarization changes.  No ectopy.  October 24, 2017 ECG (independently read by me): Normal sinus rhythm at 62 bpm.  Right bundle branch block with repolarization changes.   ECG  April 18, 2014 revealed normal sinus rhythm.  Right bundle branch block was not present.  Recent Labs: BMP Latest Ref Rng & Units 04/18/2020 04/14/2020 01/16/2019  Glucose 70 - 99 mg/dL 153(H) 112(H) 79  BUN 8 - 23 mg/dL _0 Creatinine 0.61 - 1.24 mg/dL 0.87 0.91 0.75(L)  BUN/Creat Ratio 10 - 24 - - 24  Sodium 135 - 145 mmol/L 135 138 139  Potassium 3.5 - 5.1 mmol/L 4.4 4.5 4.4  Chloride 98 - 111 mmol/L 100 104 102  CO2 22 - 32 mmol/L _1 Calcium 8.9 - 10.3 mg/dL 8.8(L) 9.2 8.7     Hepatic Function Latest Ref Rng & Units 11/16/2018 10/05/2018 04/03/2018  Total Protein 6.0 - 8.5 g/dL 6.6 6.7 6.8  Albumin 3.7 - 4.7 g/dL 4.1 4.6 4.3  AST 0 - 40 IU/L 37 46(H) 32  ALT 0 - 44 IU/L 41 49(H) 32  Alk Phosphatase 39 - 117 IU/L 124(H) 91 183(H)  Total Bilirubin 0.0 - 1.2 mg/dL 0.4 0.4 0.4  Bilirubin, Direct 0.00 - 0.40 mg/dL - - -    CBC Latest Ref Rng & Units 04/18/2020 04/14/2020 01/16/2019  WBC 4.0 - 10.5 K/uL 14.1(H) 8.1 6.4  Hemoglobin 13.0 - 17.0 g/dL 12.8(L) 14.8 14.3  Hematocrit 39.0 - 52.0 % 37.3(L) 42.9 41.9  Platelets 150 - 400 K/uL 211 212 159   Lab Results  Component Value Date   MCV 96.9 04/18/2020   MCV 99.3 04/14/2020   MCV 95 01/16/2019   Lab  Results  Component Value Date   TSH 4.050 01/16/2019   Lab Results  Component Value Date   HGBA1C 5.4 11/15/2017  BNP    Component Value Date/Time   BNP 131.4 (H) 10/05/2018 1010    ProBNP No results found for: PROBNP   Lipid Panel     Component Value Date/Time   CHOL 143 10/05/2018 1010   TRIG 142 10/05/2018 1010   HDL 48 10/05/2018 1010   CHOLHDL 3.0 10/05/2018 1010   CHOLHDL 3.4 01/29/2007 0410   VLDL 17 01/29/2007 0410   LDLCALC 67 10/05/2018 1010     RADIOLOGY: XR HIP UNILAT W OR W/O PELVIS 2-3 VIEWS RIGHT  Result Date: 07/16/2020 An AP pelvis and lateral of the right hip shows a well-seated total hip arthroplasty with no complicating features or acute findings.     Additional studies/ records that were reviewed today include:  I reviewed his prior office visits and most recent DC cardioversion.  CONCLUSIONS:  1. Isthmus-dependent right atrial flutter upon presentation.  2. Successful radiofrequency ablation of atrial flutter along the cavotricuspid isthmus with complete bidirectional isthmus block achieved.  3. No inducible arrhythmias following ablation.  4. No early apparent complications.    Carl Peru, MD  12:37 PM 01/11/2019    SPLIT NIGHT SLEEP STUDY: 01/25/2019 SLEEP STUDY TECHNIQUE As per the AASM Manual for the Scoring of Sleep and Associated Events v2.3 (April 2016) with a hypopnea requiring 4% desaturations.   The channels recorded and monitored were frontal, central and occipital EEG, electrooculogram (EOG), submentalis EMG (chin), nasal and oral airflow, thoracic and abdominal wall motion, anterior tibialis EMG, snore microphone, electrocardiogram, and pulse oximetry. Bi-level positive airway pressure (BiPAP) was initiated when the patient met split night criteria and was titrated according to treat sleep-disordered breathing.   RESPIRATORY PARAMETERS Diagnostic Total AHI (/hr):            63.5     RDI (/hr):         65.8     OA  Index (/hr):            17.7     CA Index (/hr):      5.9 REM AHI (/hr):            N/A      NREM AHI (/hr):          63.5     Supine AHI (/hr):         63.5     Non-supine AHI (/hr):        N/A Min O2 Sat (%):          81.0     Mean O2 (%):  93.0     Time below 88% (min):           18.7        Titration Optimal IPAP Pressure (cm): 18        Optimal EPAP Pressure (cm):            14        AHI at Optimal Pressure (/hr):          3.6       Min O2 at Optimal Pressure (%):       93.0 Sleep % at Optimal (%):         97        Supine % at Optimal (%):       7             SLEEP ARCHITECTURE The study was initiated at 10:10:34 PM and terminated at 4:34:09 AM. The total recorded time was 383.6 minutes.  EEG confirmed total sleep time was 333.3 minutes yielding a sleep efficiency of 86.9%%. Sleep onset after lights out was 0.8 minutes with a REM latency of 195.0 minutes. The patient spent 1.5%% of the night in stage N1 sleep, 80.9%% in stage N2 sleep, 0.0%% in stage N3 and 17.6% in REM. Wake after sleep onset (WASO) was 49.5 minutes. The Arousal Index was 32.0/hour.   LEG MOVEMENT DATA The total Periodic Limb Movements of Sleep (PLMS) were 0. The PLMS index was 0.0 .   CARDIAC DATA The 2 lead EKG demonstrated sinus rhythm. The mean heart rate was 100.0 beats per minute. Other EKG findings include: None.   IMPRESSIONS - Severe obstructive sleep apnea occurred during the diagnostic portion of the study (AHI 63.5 /h; RDI 65.8/h). CPAP was initiated at 5 cm and was titrated to 9 cm . Due to frequent central events, BiPAP was implemented at 11/7 and was titrated to 18/14 cm of water: AHI 3.6/h, O2 nadir 93%. 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 - Moderate oxygen desaturation was noted during the diagnostic portion of the study (Min O2 = 81.0%). - The patient snored with loud snoring volume during the diagnostic portion of the study. - It appears that  the patient was in atrial fibrillation throughtout the study. - Clinically significant periodic limb movements of sleep did not occur during the study.   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 A Small size Fisher[employer]Paykel Full Face Mask Simplus mask was used for the titration. If patient continues to experience central events, ASV titration may be necessary. - Effort should be made to optimize nasal and oropharyngeal patency. - Avoid alcohol, sedatives and other CNS depressants that may worsen sleep apnea and disrupt normal sleep architecture. - Sleep hygiene should be reviewed to assess factors that may improve sleep quality. - Weight management and regular exercise should be initiated or continued. - Recoomend f/u cardiology evalution for probable recurrent atrial fibrillation.  - Recommend a download in 30 days and sleep clinic evaluation after 30 days of therapy.   [Electronically signed] 01/27/2019 04:13 PM   Carl Majestic MD, [facility], [place], American Board of Sleep Medicine    ASSESSMENT:    1. CAD in native artery   2. S/P CABG x 4   3. Hyperlipidemia LDL goal <70   4. Typical atrial flutter ([place])   5. S/P ablation of atrial flutter   6. OSA (obstructive sleep apnea)   7. 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  He has just retired at the end of December 2021.  He has a history of mild hyperlipidemia and remotely had been on simvastatin 40 mg daily and had been followed by Dr. Hulan [name].  In the fall 2019 he began to notice mild shortness of breath particularly with walking up steps.  An echo Doppler study revealed hyperdynamic LV function with an EF of 65 to 70% but with grade 2 diastolic  dysfunction and abnormal tissue Doppler.  A CT coronary angiogram was suggestive of significant multivessel CAD and was FFR positive.  He was found to have significant multivessel CAD at catheterization leading to urgent  CABG revascularization surgery the following day which was successfully done by Dr. Servando [name].  His postoperative course was complicated by paroxysmal atrial fibrillation for which he was started on amiodarone and ultimately was discharged on Eliquis.  When I saw him for initial post hospital evaluation he was still on amiodarone 200 mg twice a day, metoprolol tartrate 25 mg twice a day.  His ECG showed sinus bradycardia 5 weeks status post CABG revascularization; amiodarone was decreased to 200 mg daily and subsequently discontinued when subsequent laboratory showed LFT elevation and further increase in his TSH level.  With his hypothyroidism he required increasing doses of levothyroxine, ultimately titrated to his present dose of 150 mcg daily.  He had developed episodes of atrial flutter and underwent initial cardioversion in October 2020.  Due to recurrent atrial flutter he was ultimately referred to Dr. Lovena Le and underwent successful a flutter ablation on January 12, 2019.  Due to concerns for obstructive sleep apnea he ultimately was able to have his sleep study which confirmed my suspicion.  He was found to have severe sleep apnea with an AHI of over 60 times per hour with significant oxygen desaturation to a nadir of 80%.  When I saw him for sleep and follow-up of his ablation in early January,  he has been on BiPAP therapy for 9 days and felt marked improvement in his previous fatigability and energy.   Of note he had run out of his metoprolol 2 days prior to his sleep study and he was back in atrial fibrillation during his sleep evaluation.  His ECG at his January visit on metoprolol 75 mg twice a day revealed  sinus rhythm with asymptomatic sinus bradycardia 51 bpm.  He underwent  successful right hip replacement surgery without cardiovascular compromise.  A week and a half ago he had called our office complaining that he was much more fatigued.  At that time I recommended that he come to the office the following day but he was leaving to [place] and therefore could not come at that time but presents today for evaluation.  In the interim, he has noticed improvement and he is no longer fatigued.  He is now dating and new girlfriend regularly and feels invigorated.  His ECG continues to show sinus rhythm.  He is bradycardic.  If continued fatigability recurs, depending upon his thyroid status it may be necessary to slightly reduce his beta-blocker regimen.  He is unaware of recurrent arrhythmia.  I have recommending repeat fasting laboratory today with a comprehensive metabolic panel, CBC, TSH, fasting lipid studies and hemoglobin A1c.  Adjustments will be made to his medical regimen depending upon the results.  He had held his Eliquis for his surgery.  He again reiterated that he is sleeping well and denies any daytime sleepiness.  He is unaware of snoring.  I will see him in 6 months for reevaluation     Medication Adjustments/Labs and Tests Ordered: Current medicines are reviewed at length with the patient today.  Concerns regarding medicines are outlined above.  Medication changes, Labs and Tests ordered today are listed in the Patient Instructions below. Patient Instructions  Medication Instructions:  Your physician recommends that you continue on your current medications as directed. Please refer to the Current Medication list given to you today.  *If you need a refill on your cardiac medications before your next appointment, please call your pharmacy*   Lab Work: Cmet, CBC, TSH, Lipid, A1C If you have labs (blood work) drawn  today and your tests are completely normal, you will receive your results only by: MyChart Message (if you have MyChart) OR A paper copy in  the mail If you have any lab test that is abnormal or we need to change your treatment, we will call you to review the results.   Testing/Procedures: None ordered.    Follow-Up: At [facility], you and your health needs are our priority.  As part of our continuing mission to provide you with exceptional heart care, we have created designated Provider Care Teams.  These Care Teams include your primary Cardiologist (physician) and Advanced Practice Providers (APPs -  Physician Assistants and Nurse Practitioners) who all work together to provide you with the care you need, when you need it.  We recommend signing up for the patient portal called "MyChart".  Sign up information is provided on this After Visit Summary.  MyChart is used to connect with patients for Virtual Visits (Telemedicine).  Patients are able to view lab/test results, encounter notes, upcoming appointments, etc.  Non-urgent messages can be sent to your provider as well.   To learn more about what you can do with MyChart, go to [email or web address].    Your next appointment:   6 month(s)  The format for your next appointment:   In Person  Provider:   Shelva Majestic, MD      Signed, Carl Majestic, MD  08/03/2020 3:28 PM    [facility] [address], [place], [place], [place]  [address] Phone: [phone]

## 2020-08-03 NOTE — Patient Instructions (Signed)
Medication Instructions:  Your physician recommends that you continue on your current medications as directed. Please refer to the Current Medication list given to you today.  *If you need a refill on your cardiac medications before your next appointment, please call your pharmacy*   Lab Work: Cmet, CBC, TSH, Lipid, A1C If you have labs (blood work) drawn today and your tests are completely normal, you will receive your results only by: [place] (if you have MyChart) OR A paper copy in the mail If you have any lab test that is abnormal or we need to change your treatment, we will call you to review the results.   Testing/Procedures: None ordered.    Follow-Up: At [facility], you and your health needs are our priority.  As part of our continuing mission to provide you with exceptional heart care, we have created designated Provider Care Teams.  These Care Teams include your primary Cardiologist (physician) and Advanced Practice Providers (APPs -  Physician Assistants and Nurse Practitioners) who all work together to provide you with the care you need, when you need it.  We recommend signing up for the patient portal called "MyChart".  Sign up information is provided on this After Visit Summary.  MyChart is used to connect with patients for Virtual Visits (Telemedicine).  Patients are able to view lab/test results, encounter notes, upcoming appointments, etc.  Non-urgent messages can be sent to your provider as well.   To learn more about what you can do with MyChart, go to [email or web address].    Your next appointment:   6 month(s)  The format for your next appointment:   In Person  Provider:   Shelva Majestic, MD

## 2020-08-04 LAB — HEMOGLOBIN A1C
Est. average glucose Bld gHb Est-mCnc: 111 mg/dL
Hgb A1c MFr Bld: 5.5 % (ref 4.8–5.6)

## 2020-08-04 LAB — COMPREHENSIVE METABOLIC PANEL
ALT: 36 IU/L (ref 0–44)
AST: 29 IU/L (ref 0–40)
Albumin/Globulin Ratio: 2.2 (ref 1.2–2.2)
Albumin: 4.7 g/dL (ref 3.7–4.7)
Alkaline Phosphatase: 127 IU/L — ABNORMAL HIGH (ref 44–121)
BUN/Creatinine Ratio: 12 (ref 10–24)
BUN: 11 mg/dL (ref 8–27)
Bilirubin Total: 0.4 mg/dL (ref 0.0–1.2)
CO2: 23 mmol/L (ref 20–29)
Calcium: 9.6 mg/dL (ref 8.6–10.2)
Chloride: 98 mmol/L (ref 96–106)
Creatinine, Ser: 0.92 mg/dL (ref 0.76–1.27)
Globulin, Total: 2.1 g/dL (ref 1.5–4.5)
Glucose: 104 mg/dL — ABNORMAL HIGH (ref 65–99)
Potassium: 4.9 mmol/L (ref 3.5–5.2)
Sodium: 138 mmol/L (ref 134–144)
Total Protein: 6.8 g/dL (ref 6.0–8.5)
eGFR: 86 mL/min/{1.73_m2} (ref >59)

## 2020-08-04 LAB — CBC
Hematocrit: 47.4 % (ref 37.5–51.0)
Hemoglobin: 16.1 g/dL (ref 13.0–17.7)
MCH: 32.1 pg (ref 26.6–33.0)
MCHC: 34 g/dL (ref 31.5–35.7)
MCV: 94 fL (ref 79–97)
Platelets: 240 10*3/uL (ref 150–450)
RBC: 5.02 x10E6/uL (ref 4.14–5.80)
RDW: 12.5 % (ref 11.6–15.4)
WBC: 7.6 10*3/uL (ref 3.4–10.8)

## 2020-08-04 LAB — TSH: TSH: 2.52 u[IU]/mL (ref 0.450–4.500)

## 2020-08-04 LAB — LIPID PANEL
Chol/HDL Ratio: 3.4 ratio (ref 0.0–5.0)
Cholesterol, Total: 158 mg/dL (ref 100–199)
HDL: 46 mg/dL (ref >39)
LDL Chol Calc (NIH): 83 mg/dL (ref 0–99)
Triglycerides: 172 mg/dL — ABNORMAL HIGH (ref 0–149)
VLDL Cholesterol Cal: 29 mg/dL (ref 5–40)

## 2020-08-24 ENCOUNTER — Other Ambulatory Visit: Payer: Self-pay

## 2020-08-24 MED ORDER — ROSUVASTATIN CALCIUM 40 MG PO TABS: 40.0000 mg | ORAL_TABLET | Freq: Every day | ORAL | 3 refills | Status: DC

## 2020-08-25 ENCOUNTER — Telehealth: Payer: Self-pay | Admitting: Internal Medicine

## 2020-08-25 NOTE — Telephone Encounter (Signed)
Inbound call from patient stating he took his Eliquis medication last night and is wanting to know if he will need to reschedule.  Please advise.

## 2020-08-25 NOTE — Telephone Encounter (Addendum)
Called and spoke with pt. Pt actually took his Eliquis this morning. Pt stated that he takes it twice a day, and took it last night and at 8am this morning. Spoke with MD, and MD recommended to reschedule procedure due to patient having a higher risk of bleeding. Informed pt of MD recommendation. Pt rescheduled to 08/28/20 at 0930, and new prep times were given to pt. Pt verbalized understanding.

## 2020-08-26 ENCOUNTER — Encounter: Payer: PPO | Admitting: Internal Medicine

## 2020-08-28 ENCOUNTER — Encounter: Payer: Self-pay | Admitting: Internal Medicine

## 2020-08-28 ENCOUNTER — Other Ambulatory Visit: Payer: Self-pay

## 2020-08-28 ENCOUNTER — Ambulatory Visit (AMBULATORY_SURGERY_CENTER): Payer: PPO | Admitting: Internal Medicine

## 2020-08-28 VITALS — BP 158/69 | HR 49 | Temp 98.0°F | Resp 13 | Ht 67.0 in | Wt 206.0 lb

## 2020-08-28 DIAGNOSIS — D120 Benign neoplasm of cecum: Secondary | ICD-10-CM

## 2020-08-28 DIAGNOSIS — K5732 Diverticulitis of large intestine without perforation or abscess without bleeding: Secondary | ICD-10-CM

## 2020-08-28 DIAGNOSIS — K5730 Diverticulosis of large intestine without perforation or abscess without bleeding: Secondary | ICD-10-CM

## 2020-08-28 DIAGNOSIS — D123 Benign neoplasm of transverse colon: Secondary | ICD-10-CM | POA: Diagnosis not present

## 2020-08-28 DIAGNOSIS — Z860100 Personal history of colon polyps, unspecified: Secondary | ICD-10-CM

## 2020-08-28 DIAGNOSIS — Z1211 Encounter for screening for malignant neoplasm of colon: Secondary | ICD-10-CM | POA: Diagnosis not present

## 2020-08-28 DIAGNOSIS — Z7901 Long term (current) use of anticoagulants: Secondary | ICD-10-CM

## 2020-08-28 MED ORDER — SODIUM CHLORIDE 0.9 % IV SOLN: 500.0000 mL | Freq: Once | INTRAVENOUS | Status: DC

## 2020-08-28 NOTE — Op Note (Signed)
[facility] Patient Name: Carl [name] Procedure Date: 08/28/2020 9:43 AM MRN: [id number] Endoscopist: Docia Chuck. Henrene Pastor , MD Age: 75 Referring MD:  Date of Birth: Jan 07, 1946 Gender: Male Account #: [id number] Procedure:                Colonoscopy with cold snare polypectomy x 2 Indications:              High risk colon cancer surveillance: Personal                            history of multiple (3 or more) adenomas. Previous                            examinations 1997, 2000, 2004, 2008, 2011, 2016 Medicines:                Monitored Anesthesia Care Procedure:                Pre-Anesthesia Assessment:                           - Prior to the procedure, a History and Physical                            was performed, and patient medications and                            allergies were reviewed. The patient's tolerance of                            previous anesthesia was also reviewed. The risks                            and benefits of the procedure and the sedation                            options and risks were discussed with the patient.                            All questions were answered, and informed consent                            was obtained. Prior Anticoagulants: The patient has                            taken Eliquis (apixaban), last dose was 3 days                            prior to procedure. ASA Grade Assessment: III - A                            patient with severe systemic disease. After                            reviewing the risks and benefits, the patient was  deemed in satisfactory condition to undergo the                            procedure.                           After obtaining informed consent, the colonoscope                            was passed under direct vision. Throughout the                            procedure, the patient's blood pressure, pulse, and                            oxygen saturations were  monitored continuously. The                            [id number]-HQ190L Colonoscope was introduced through the                            anus and advanced to the the cecum, identified by                            appendiceal orifice and ileocecal valve. The                            ileocecal valve, appendiceal orifice, and rectum                            were photographed. The quality of the bowel                            preparation was good. The colonoscopy was performed                            without difficulty. The patient tolerated the                            procedure well. The bowel preparation used was                            SUPREP/tablets via split dose instruction. Scope In: 9:57:16 AM Scope Out: 10:15:09 AM Scope Withdrawal Time: 0 hours 14 minutes 43 seconds  Total Procedure Duration: 0 hours 17 minutes 53 seconds  Findings:                 Two polyps were found in the transverse colon and                            cecum. The polyps were 2 mm in size. These polyps                            were removed with a cold snare. Resection and  retrieval were complete.                           Many small and large-mouthed diverticula were found                            in the left colon. There was sigmoid stenosis.                           The exam was otherwise without abnormality on                            direct and retroflexion views. Complications:            No immediate complications. Estimated blood loss:                            None. Estimated Blood Loss:     Estimated blood loss: none. Impression:               - Two 2 mm polyps in the transverse colon and in                            the cecum, removed with a cold snare. Resected and                            retrieved.                           - Diverticulosis in the left colon.                           - The examination was otherwise normal on direct                             and retroflexion views. Recommendation:           - Repeat colonoscopy is not recommended for                            surveillance.                           - Resume Eliquis (apixaban) today at prior dose.                           - Patient has a contact number available for                            emergencies. The signs and symptoms of potential                            delayed complications were discussed with the                            patient. Return to normal activities tomorrow.  Written discharge instructions were provided to the                            patient.                           - Resume previous diet.                           - Continue present medications.                           - Await pathology results. Docia Chuck. Henrene Pastor, MD 08/28/2020 10:22:11 AM This report has been signed electronically.

## 2020-08-28 NOTE — Progress Notes (Signed)
Medical history reviewed with no changes noted. VS assessed by C.W 

## 2020-08-28 NOTE — Progress Notes (Signed)
A/ox3, pleased with MAC, report to RN 

## 2020-08-28 NOTE — Patient Instructions (Signed)
YOU HAD AN ENDOSCOPIC PROCEDURE TODAY AT [facility]:   Refer to the procedure report that was given to you for any specific questions about what was found during the examination.  If the procedure report does not answer your questions, please call your gastroenterologist to clarify.  If you requested that your care partner not be given the details of your procedure findings, then the procedure report has been included in a sealed envelope for you to review at your convenience later.  YOU SHOULD EXPECT: Some feelings of bloating in the abdomen. Passage of more gas than usual.  Walking can help get rid of the air that was put into your GI tract during the procedure and reduce the bloating. If you had a lower endoscopy (such as a colonoscopy or flexible sigmoidoscopy) you may notice spotting of blood in your stool or on the toilet paper. If you underwent a bowel prep for your procedure, you may not have a normal bowel movement for a few days.  Please Note:  You might notice some irritation and congestion in your nose or some drainage.  This is from the oxygen used during your procedure.  There is no need for concern and it should clear up in a day or so.  SYMPTOMS TO REPORT IMMEDIATELY:  Following lower endoscopy (colonoscopy or flexible sigmoidoscopy):  Excessive amounts of blood in the stool  Significant tenderness or worsening of abdominal pains  Swelling of the abdomen that is new, acute  Fever of 100F or higher   For urgent or emergent issues, a gastroenterologist can be reached at any hour by calling [phone]. Do not use MyChart messaging for urgent concerns.    DIET:  We do recommend a small meal at first, but then you may proceed to your regular diet.  Drink plenty of fluids but you should avoid alcoholic beverages for 24 hours.  MEDICATIONS: Continue present medications. Resume Eliquis (apixaban) today at prior dose.  Please see handouts given to you by your  recovery nurse.  Thank you for allowing [place] to provide for your healthcare needs today.  ACTIVITY:  You should plan to take it easy for the rest of today and you should NOT DRIVE or use heavy machinery until tomorrow (because of the sedation medicines used during the test).    FOLLOW UP: Our staff will call the number listed on your records 48-72 hours following your procedure to check on you and address any questions or concerns that you may have regarding the information given to you following your procedure. If we do not reach you, we will leave a message.  We will attempt to reach you two times.  During this call, we will ask if you have developed any symptoms of COVID 19. If you develop any symptoms (ie: fever, flu-like symptoms, shortness of breath, cough etc.) before then, please call [phone].  If you test positive for Covid 19 in the 2 weeks post procedure, please call and report this information to [place].    If any biopsies were taken you will be contacted by phone or by letter within the next 1-3 weeks.  Please call us at [phone] if you have not heard about the biopsies in 3 weeks.    SIGNATURES/CONFIDENTIALITY: You and/or your care partner have signed paperwork which will be entered into your electronic medical record.  These signatures attest to the fact that that the information above on your After Visit Summary has been reviewed and is  understood.  Full responsibility of the confidentiality of this discharge information lies with you and/or your care-partner.

## 2020-08-28 NOTE — Progress Notes (Signed)
Called to room to assist during endoscopic procedure.  Patient ID and intended procedure confirmed with present staff. Received instructions for my participation in the procedure from the performing physician.  

## 2020-09-01 ENCOUNTER — Telehealth: Payer: Self-pay | Admitting: *Deleted

## 2020-09-01 NOTE — Telephone Encounter (Signed)
  Follow up Call-  Call back number 08/28/2020  Post procedure Call Back phone  # [phone]  Permission to leave phone message Yes  Some recent data might be hidden     Patient questions:  Do you have a fever, pain , or abdominal swelling? No. Pain Score  0 *  Have you tolerated food without any problems? Yes.    Have you been able to return to your normal activities? Yes.    Do you have any questions about your discharge instructions: Diet   No. Medications  No. Follow up visit  No.  Do you have questions or concerns about your Care? No.  Actions: * If pain score is 4 or above: No action needed, pain <4.Have you developed a fever since your procedure? no  2.   Have you had an respiratory symptoms (SOB or cough) since your procedure? no  3.   Have you tested positive for COVID 19 since your procedure no  4.   Have you had any family members/close contacts diagnosed with the COVID 19 since your procedure?  no   If yes to any of these questions please route to Joylene John, RN and Joella Prince, RN

## 2020-09-03 ENCOUNTER — Encounter: Payer: Self-pay | Admitting: Internal Medicine

## 2020-09-21 DIAGNOSIS — T8579XS Infection and inflammatory reaction due to other internal prosthetic devices, implants and grafts, sequela: Secondary | ICD-10-CM | POA: Diagnosis not present

## 2020-09-21 DIAGNOSIS — H353132 Nonexudative age-related macular degeneration, bilateral, intermediate dry stage: Secondary | ICD-10-CM | POA: Diagnosis not present

## 2020-09-29 DIAGNOSIS — L600 Ingrowing nail: Secondary | ICD-10-CM | POA: Diagnosis not present

## 2020-10-02 ENCOUNTER — Telehealth: Payer: Self-pay | Admitting: Internal Medicine

## 2020-10-02 MED ORDER — AMOXICILLIN-POT CLAVULANATE 875-125 MG PO TABS: 1.0000 | ORAL_TABLET | Freq: Two times a day (BID) | ORAL | 1 refills | Status: DC

## 2020-10-02 NOTE — Telephone Encounter (Signed)
Inbound call from patient. States he is experiencing a flare up of diverticulitis and would like the medication to help. Does not know the name of the medication.   [facility] on [address] in [place]

## 2020-10-02 NOTE — Telephone Encounter (Signed)
Prescribe Augmentin 875 mg twice daily for 1 week.  1 refill

## 2020-10-02 NOTE — Telephone Encounter (Signed)
See note below and advise. 

## 2020-10-02 NOTE — Addendum Note (Signed)
Addended by: Rosanne Sack R on: 10/02/2020 04:14 PM   Modules accepted: Orders

## 2020-10-02 NOTE — Telephone Encounter (Signed)
Detailed message left for pt and script sent to the pharmacy.

## 2020-10-26 ENCOUNTER — Telehealth: Payer: Self-pay | Admitting: *Deleted

## 2020-10-26 ENCOUNTER — Other Ambulatory Visit: Payer: Self-pay | Admitting: Cardiovascular Disease

## 2020-10-26 MED ORDER — NITROGLYCERIN 0.4 MG SL SUBL: SUBLINGUAL_TABLET | SUBLINGUAL | 1 refills | Status: DC

## 2020-10-26 NOTE — Telephone Encounter (Signed)
Patient called in to request a refill on his NTG. He states that he lost his keys that has the bottle on it. He also requested two bottles. One to keep at home and one to keep on his key chain. NTG refilled to [facility] on [address].

## 2020-10-27 ENCOUNTER — Observation Stay (HOSPITAL_COMMUNITY)
Admission: EM | Admit: 2020-10-27 | Discharge: 2020-10-29 | Disposition: A | Discharge status: Home / Self Care | Payer: PPO | Attending: Internal Medicine | Admitting: Internal Medicine

## 2020-10-27 ENCOUNTER — Emergency Department (HOSPITAL_COMMUNITY): Payer: PPO

## 2020-10-27 ENCOUNTER — Encounter (HOSPITAL_COMMUNITY): Payer: Self-pay | Admitting: Internal Medicine

## 2020-10-27 ENCOUNTER — Other Ambulatory Visit: Payer: Self-pay

## 2020-10-27 ENCOUNTER — Encounter: Payer: Self-pay | Admitting: Internal Medicine

## 2020-10-27 DIAGNOSIS — Z7901 Long term (current) use of anticoagulants: Secondary | ICD-10-CM | POA: Diagnosis not present

## 2020-10-27 DIAGNOSIS — E039 Hypothyroidism, unspecified: Secondary | ICD-10-CM | POA: Diagnosis present

## 2020-10-27 DIAGNOSIS — Z951 Presence of aortocoronary bypass graft: Secondary | ICD-10-CM | POA: Diagnosis not present

## 2020-10-27 DIAGNOSIS — Z96641 Presence of right artificial hip joint: Secondary | ICD-10-CM | POA: Diagnosis not present

## 2020-10-27 DIAGNOSIS — G4733 Obstructive sleep apnea (adult) (pediatric): Secondary | ICD-10-CM | POA: Diagnosis present

## 2020-10-27 DIAGNOSIS — I480 Paroxysmal atrial fibrillation: Secondary | ICD-10-CM | POA: Insufficient documentation

## 2020-10-27 DIAGNOSIS — Z79899 Other long term (current) drug therapy: Secondary | ICD-10-CM | POA: Diagnosis not present

## 2020-10-27 DIAGNOSIS — I110 Hypertensive heart disease with heart failure: Secondary | ICD-10-CM | POA: Diagnosis not present

## 2020-10-27 DIAGNOSIS — J90 Pleural effusion, not elsewhere classified: Secondary | ICD-10-CM | POA: Diagnosis not present

## 2020-10-27 DIAGNOSIS — I2510 Atherosclerotic heart disease of native coronary artery without angina pectoris: Secondary | ICD-10-CM | POA: Insufficient documentation

## 2020-10-27 DIAGNOSIS — Z96652 Presence of left artificial knee joint: Secondary | ICD-10-CM | POA: Insufficient documentation

## 2020-10-27 DIAGNOSIS — I5033 Acute on chronic diastolic (congestive) heart failure: Principal | ICD-10-CM | POA: Diagnosis present

## 2020-10-27 DIAGNOSIS — I517 Cardiomegaly: Secondary | ICD-10-CM | POA: Diagnosis not present

## 2020-10-27 DIAGNOSIS — E785 Hyperlipidemia, unspecified: Secondary | ICD-10-CM | POA: Diagnosis present

## 2020-10-27 DIAGNOSIS — Z20822 Contact with and (suspected) exposure to covid-19: Secondary | ICD-10-CM | POA: Insufficient documentation

## 2020-10-27 DIAGNOSIS — E782 Mixed hyperlipidemia: Secondary | ICD-10-CM | POA: Diagnosis present

## 2020-10-27 DIAGNOSIS — I509 Heart failure, unspecified: Secondary | ICD-10-CM | POA: Diagnosis not present

## 2020-10-27 DIAGNOSIS — Z7982 Long term (current) use of aspirin: Secondary | ICD-10-CM | POA: Diagnosis not present

## 2020-10-27 DIAGNOSIS — I5032 Chronic diastolic (congestive) heart failure: Secondary | ICD-10-CM | POA: Diagnosis present

## 2020-10-27 DIAGNOSIS — I25119 Atherosclerotic heart disease of native coronary artery with unspecified angina pectoris: Secondary | ICD-10-CM | POA: Insufficient documentation

## 2020-10-27 DIAGNOSIS — R0602 Shortness of breath: Secondary | ICD-10-CM | POA: Diagnosis not present

## 2020-10-27 HISTORY — DX: Hypothyroidism, unspecified: E03.9

## 2020-10-27 HISTORY — DX: Atherosclerotic heart disease of native coronary artery without angina pectoris: I25.10

## 2020-10-27 HISTORY — DX: Obstructive sleep apnea (adult) (pediatric): G47.33

## 2020-10-27 HISTORY — DX: Paroxysmal atrial fibrillation: I48.0

## 2020-10-27 HISTORY — DX: Chronic diastolic (congestive) heart failure: I50.32

## 2020-10-27 LAB — CBC WITH DIFFERENTIAL/PLATELET
Abs Immature Granulocytes: 0.03 10*3/uL (ref 0.00–0.07)
Basophils Absolute: 0.1 10*3/uL (ref 0.0–0.1)
Basophils Relative: 1 %
Eosinophils Absolute: 0.2 10*3/uL (ref 0.0–0.5)
Eosinophils Relative: 2 %
HCT: 42.9 % (ref 39.0–52.0)
Hemoglobin: 14.5 g/dL (ref 13.0–17.0)
Immature Granulocytes: 0 %
Lymphocytes Relative: 15 %
Lymphs Abs: 1.3 10*3/uL (ref 0.7–4.0)
MCH: 33.1 pg (ref 26.0–34.0)
MCHC: 33.8 g/dL (ref 30.0–36.0)
MCV: 97.9 fL (ref 80.0–100.0)
Monocytes Absolute: 1.1 10*3/uL — ABNORMAL HIGH (ref 0.1–1.0)
Monocytes Relative: 13 %
Neutro Abs: 5.7 10*3/uL (ref 1.7–7.7)
Neutrophils Relative %: 69 %
Platelets: 198 10*3/uL (ref 150–400)
RBC: 4.38 MIL/uL (ref 4.22–5.81)
RDW: 13.6 % (ref 11.5–15.5)
WBC: 8.4 10*3/uL (ref 4.0–10.5)
nRBC: 0 % (ref 0.0–0.2)

## 2020-10-27 LAB — COMPREHENSIVE METABOLIC PANEL
ALT: 25 U/L (ref 0–44)
AST: 24 U/L (ref 15–41)
Albumin: 3.9 g/dL (ref 3.5–5.0)
Alkaline Phosphatase: 113 U/L (ref 38–126)
Anion gap: 12 (ref 5–15)
BUN: 12 mg/dL (ref 8–23)
CO2: 21 mmol/L — ABNORMAL LOW (ref 22–32)
Calcium: 9.2 mg/dL (ref 8.9–10.3)
Chloride: 103 mmol/L (ref 98–111)
Creatinine, Ser: 0.89 mg/dL (ref 0.61–1.24)
GFR, Estimated: >60 mL/min (ref >60)
Glucose, Bld: 103 mg/dL — ABNORMAL HIGH (ref 70–99)
Potassium: 4.3 mmol/L (ref 3.5–5.1)
Sodium: 136 mmol/L (ref 135–145)
Total Bilirubin: 0.9 mg/dL (ref 0.3–1.2)
Total Protein: 6.7 g/dL (ref 6.5–8.1)

## 2020-10-27 LAB — RESP PANEL BY RT-PCR (FLU A&B, COVID) ARPGX2
Influenza A by PCR: NEGATIVE
Influenza B by PCR: NEGATIVE
SARS Coronavirus 2 by RT PCR: NEGATIVE

## 2020-10-27 LAB — BRAIN NATRIURETIC PEPTIDE: B Natriuretic Peptide: 667.7 pg/mL — ABNORMAL HIGH (ref 0.0–100.0)

## 2020-10-27 LAB — TROPONIN I (HIGH SENSITIVITY): Troponin I (High Sensitivity): 10 ng/L (ref <18)

## 2020-10-27 MED ORDER — FUROSEMIDE 10 MG/ML IJ SOLN
40.0000 mg | Freq: Once | INTRAMUSCULAR | Status: AC
  Administered 2020-10-27: 40 mg via INTRAVENOUS
  Filled 2020-10-27: qty 4

## 2020-10-27 NOTE — ED Provider Notes (Signed)
Emergency Medicine Provider Triage Evaluation Note  KELVON GIANNINI , a 75 y.o. male  was evaluated in triage.  Pt complains of SOB for several weeks. Sx have been getting progressively worse, worse with walking. Has felt swollen in his abdomen. Hx of quadruple bypass.   Review of Systems  Positive: SOB, abdominal distention Negative: CP, leg swelling, N/V, abdominal pain  Physical Exam  BP (!) 189/77 (BP Location: Right Arm)   Pulse (!) 52   Temp 98.7 F (37.1 C) (Oral)   Resp 18   SpO2 97%  Gen:   Awake, no distress   Resp:  Normal effort  MSK:   Moves extremities without difficulty  Other:  Abdomen distended, nontender  Medical Decision Making  Medically screening exam initiated at 5:08 PM.  Appropriate orders placed.  Denice Bors was informed that the remainder of the evaluation will be completed by another provider, this initial triage assessment does not replace that evaluation, and the importance of remaining in the ED until their evaluation is complete.     Estill Cotta 10/27/20 1732    Tegeler, Gwenyth Allegra, MD 10/27/20 1859

## 2020-10-27 NOTE — ED Provider Notes (Signed)
[facility] EMERGENCY DEPARTMENT Provider Note   CSN: [id number] Arrival date & time: 10/27/20  1650     History Chief Complaint  Patient presents with  . Chest Pain  . Shortness of Breath    Carl [name] is a 75 y.o. male.  75 year old male with history of CABG x 4, hyperlipidemia, HTN, TIA, on Eliquis for a flutter, additional history as listed below, presents with complaint of worsening DOE over the past month, improves with rest. Woke up today with worsening symptoms, called his cardiologist and was advised to go to PCP who advised him to go to the ER. CABG 3 years ago, only symptom at that time was [facility] and states his [facility] today is much worse than [facility] he was having at that time. Has not been to his cards since this started, last seen by card in 06/2020. Denies CP. Feels like his  abdomen is "full" with 7-10lbs weight gain this month although also reports going to a wedding and diet hasn't been great lately. Denies orthopnea. Reports 1 month ago was able to walk without any SHOB, 1 month ago noticed DOE after walking 75 feet, can barely walk through his yard currently.       Past Medical History:  Diagnosis Date  . Anxiety    takes Xanax daily as needed  . Arthritis   . CAD (coronary artery disease)    s/p CABG  . Cataract    removed both eyes  . Chronic diastolic CHF (congestive heart failure) ([place])   . Depression    takes Celexa daily  . Diverticulitis   . Diverticulosis   . Enlarged prostate    sightly  . Esophagitis   . Gastric ulcer   . GERD (gastroesophageal reflux disease)    takes Protonix daily  . Hx of adenomatous colonic polyps    benign  . Hyperlipidemia    takes Zocor daily  . Hypothyroidism   . Insomnia    takes Ambien nightly  . Joint pain   . Joint swelling   . OSA (obstructive sleep apnea)   . Paroxysmal atrial fibrillation (H[facility])    On Eliquis    Patient Active Problem List   Diagnosis Date Noted  . Acute on chronic  diastolic CHF (congestive heart failure) ([place]) 10/27/2020  . CAD (coronary artery disease) 10/27/2020  . Hypothyroidism 10/27/2020  . Paroxysmal atrial fibrillation ([place]) 10/27/2020  . Benign hypertension 07/22/2020  . Drug-induced hypothyroidism 07/22/2020  . Glaucoma 07/22/2020  . Impaired fasting glucose 07/22/2020  . Insomnia 07/22/2020  . Macrocytosis 07/22/2020  . Microscopic hematuria 07/22/2020  . OSA (obstructive sleep apnea) 07/22/2020  . Personal history of transient ischemic attack (TIA), and cerebral infarction without residual deficits 07/22/2020  . Presence of aortocoronary bypass graft 07/22/2020  . Transient ischemic attack 07/22/2020  . Unspecified abnormal finding in specimens from other organs, systems and tissues 07/22/2020  . Status post total replacement of right hip 04/17/2020  . Avascular necrosis of bone of right hip ([place]) 04/07/2020  . Pain in right hip 02/11/2020  . Low back pain 02/11/2020  . Typical atrial flutter ([place])   . Impacted cerumen of both ears 07/16/2018  . TSH elevation 11/21/2017  . Atrial fibrillation with rapid ventricular response ([place])   . 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 Atherosclerotic heart disease of native coronary artery without angina pectoris 11/15/2017  . Abnormal cardiac CT angiography   . RLQ abdominal pain 07/07/2015  .  Primary osteoarthritis of left knee 06/17/2014  . 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 Preoperative cardiovascular examination 04/19/2014  . Mixed hyperlipidemia 04/19/2014  . AP (abdominal pain) 01/16/2014  . History of diverticulitis 01/16/2014  . GERD 01/14/2009  . ULCER-GASTRIC 01/14/2009  . GASTRITIS 11/14/2008  . DIVERTICULITIS OF COLON 11/13/2008  . BLOOD IN STOOL 11/13/2008  . ABDOMINAL PAIN 11/13/2008  . PERSONAL HISTORY OF COLONIC POLYPS 11/13/2008    Past Surgical History:  Procedure Laterality Date  . A-FLUTTER ABLATION N/A 01/11/2019   Procedure: A-FLUTTER ABLATION;  Surgeon:  Evans Lance, MD;  Location: [place] [facility] [place] [facility];  Service: Cardiovascular;  Laterality: N/A;  . CARDIAC CATHETERIZATION    . CARDIOVERSION N/A 11/20/2018   Procedure: CARDIOVERSION;  Surgeon: Troy Sine, MD;  Location: [facility];  Service: Cardiovascular;  Laterality: N/A;  . cartliage removed from nose  13yrs ago  . cataract surgery Bilateral   . CERVICAL SPINE SURGERY    . COLONOSCOPY    . CORONARY ARTERY BYPASS GRAFT N/A 11/16/2017   Procedure: CORONARY ARTERY BYPASS GRAFTING (CABG) x4. LEFT ENDOSCOPIC SAPHENOUS VEIN HARVEST AND MAMMARY ARTERY TAKE DOWN. LIMA TO LAD, SVG TO PDA, SVG TO DISTAL CIRC & OMI.;  Surgeon: [name], MD;  Location: [facility];  Service: Open Heart Surgery;  Laterality: N/A;  . EYE SURGERY    . FOREIGN BODY REMOVAL ESOPHAGEAL    . KNEE SURGERY Left    couple of times  . LEFT HEART CATH AND CORONARY ANGIOGRAPHY N/A 11/15/2017   Procedure: LEFT HEART CATH AND CORONARY ANGIOGRAPHY;  Surgeon: Troy Sine, MD;  Location: [facility];  Service: Cardiovascular;  Laterality: N/A;  . NASAL SINUS SURGERY    . POLYPECTOMY    . TEE WITHOUT CARDIOVERSION N/A 11/16/2017   Procedure: TRANSESOPHAGEAL ECHOCARDIOGRAM (TEE);  Surgeon: [name], MD;  Location: [facility];  Service: Open Heart Surgery;  Laterality: N/A;  . TONSILLECTOMY    . TOTAL HIP ARTHROPLASTY Right 04/17/2020   Procedure: RIGHT TOTAL HIP ARTHROPLASTY ANTERIOR APPROACH;  Surgeon: Mcarthur Rossetti, MD;  Location: [facility];  Service: Orthopedics;  Laterality: Right;  . TOTAL KNEE ARTHROPLASTY Left 06/17/2014   Procedure: TOTAL KNEE ARTHROPLASTY;  Surgeon: Garald Balding, MD;  Location: [facility];  Service: Orthopedics;  Laterality: Left;       Family History  Problem Relation Age of Onset  . Colon cancer Paternal Grandfather        questionable  . Colon polyps Neg Hx   . Esophageal cancer Neg Hx   . Rectal cancer Neg Hx   . Stomach cancer Neg Hx     Social History    Tobacco Use  . Smoking status: Never  . Smokeless tobacco: Never  Vaping Use  . Vaping Use: Never used  Substance Use Topics  . Alcohol use: Yes    Alcohol/week: 10.0 standard drinks    Types: 10 Standard drinks or equivalent per week    Comment: 2 drinks 5 nights per week  . Drug use: No    Home Medications Prior to Admission medications   Medication Sig Start Date End Date Taking? Authorizing Provider  acetaminophen (TYLENOL) 500 MG tablet Take 1,000 mg by mouth every 6 (six) hours as needed for moderate pain or headache.    [provider]  acyclovir (ZOVIRAX) 400 MG tablet Take 400 mg by mouth 2 (two) times daily.    [provider]  amoxicillin-clavulanate (AUGMENTIN)  875-125 MG tablet Take 1 tablet by mouth 2 (two) times daily. 10/02/20   Irene Shipper, MD  apixaban (ELIQUIS) 5 MG TABS tablet TAKE 1 TABLET(5 MG) BY MOUTH TWICE DAILY 05/07/20   Troy Sine, MD  aspirin EC 81 MG tablet Take 1 tablet (81 mg total) by mouth daily. 11/13/17   Troy Sine, MD  citalopram (CELEXA) 20 MG tablet Take 20 mg by mouth daily.    [provider]  ferrous sulfate 325 (65 FE) MG tablet Take 325 mg by mouth daily.    [provider]  icosapent Ethyl (VASCEPA) 1 g capsule TAKE 2 CAPSULES(2 GRAMS) BY MOUTH TWICE DAILY 05/06/20   Troy Sine, MD  levothyroxine (SYNTHROID) 150 MCG tablet TAKE 1 TABLET(150 MCG) BY MOUTH DAILY 06/29/20   Troy Sine, MD  methocarbamol (ROBAXIN) 500 MG tablet Take 1 tablet (500 mg total) by mouth every 6 (six) hours as needed for muscle spasms. Patient not taking: Reported on 08/28/2020 04/18/20   Mcarthur Rossetti, MD  metoprolol tartrate (LOPRESSOR) 50 MG tablet Take 1 tablet (50 mg total) by mouth 2 (two) times daily. 06/08/20   Troy Sine, MD  Multiple Vitamins-Minerals (PRESERVISION AREDS 2 PO) Take 1 tablet by mouth 2 (two) times daily.    [provider]  nitroGLYCERIN (NITROSTAT) 0.4 MG SL tablet  DISSOLVE 1 TABLET UNDER THE TONGUE EVERY 5 MINUTES AS NEEDED FOR CHEST PAIN 10/26/20   Troy Sine, MD  oxyCODONE (ROXICODONE) 5 MG immediate release tablet Take 1 tablet (5 mg total) by mouth 2 (two) times daily as needed for severe pain. Patient not taking: Reported on 08/28/2020 07/16/20   Mcarthur Rossetti, MD  oxymetazoline (AFRIN) 0.05 % nasal spray Place 1 spray into both nostrils 2 (two) times daily as needed for congestion. Patient not taking: Reported on 08/28/2020    [provider]  pantoprazole (PROTONIX) 20 MG tablet TAKE 2 TABLETS(40 MG) BY MOUTH DAILY 05/07/20   Irene Shipper, MD  PRESCRIPTION MEDICATION Apply 1 application topically daily as needed (dark spots). FLUOROURACIL 5% + CALCIPOTRIENE 0.005% 02/17/20   [provider]  rosuvastatin (CRESTOR) 40 MG tablet Take 1 tablet (40 mg total) by mouth daily. 08/24/20   Troy Sine, MD  zolpidem (AMBIEN) 10 MG tablet Take 10 mg by mouth at bedtime.    [provider]    Allergies    Patient has no known allergies.  Review of Systems   Review of Systems  Constitutional:  Negative for chills, diaphoresis and fever.  Respiratory:  Positive for shortness of breath. Negative for cough and chest tightness.   Cardiovascular:  Negative for chest pain, palpitations and leg swelling.  Gastrointestinal:  Negative for abdominal pain, nausea and vomiting.  Musculoskeletal:  Negative for arthralgias and myalgias.  Skin:  Negative for rash and wound.  Neurological:  Negative for weakness.  Hematological:  Negative for adenopathy. Bruises/bleeds easily.  Psychiatric/Behavioral:  Negative for confusion.   All other systems reviewed and are negative.  Physical Exam Updated Vital Signs BP (!) 186/86   Pulse (!) 48   Temp 98.7 F (37.1 C) (Oral)   Resp (!) 21   Ht 5\' 7"  (1.702 m)   Wt 94.8 kg   SpO2 99%   BMI 32.73 kg/m   Physical Exam Vitals and nursing note reviewed.  Constitutional:       General: He is not in acute distress.    Appearance: He is  well-developed. He is not diaphoretic.  HENT:     Head: Normocephalic and atraumatic.  Cardiovascular:     Rate and Rhythm: Regular rhythm. Bradycardia present.     Heart sounds: Normal heart sounds.  Pulmonary:     Effort: Pulmonary effort is normal.     Breath sounds: Normal breath sounds.  Chest:     Chest wall: No tenderness.  Abdominal:     Palpations: Abdomen is soft.     Tenderness: There is no abdominal tenderness.  Musculoskeletal:     Right lower leg: Edema present.     Left lower leg: Edema present.     Comments: Slight pitting edema to bilateral lower legs  Skin:    General: Skin is warm and dry.     Findings: No erythema or rash.  Neurological:     Mental Status: He is alert and oriented to person, place, and time.  Psychiatric:        Behavior: Behavior normal.    ED Results / Procedures / Treatments   Labs (all labs ordered are listed, but only abnormal results are displayed) Labs Reviewed  CBC WITH DIFFERENTIAL/PLATELET - Abnormal; Notable for the following components:      Result Value   Monocytes Absolute 1.1 (*)    All other components within normal limits  COMPREHENSIVE METABOLIC PANEL - Abnormal; Notable for the following components:   CO2 21 (*)    Glucose, Bld 103 (*)    All other components within normal limits  BRAIN NATRIURETIC PEPTIDE - Abnormal; Notable for the following components:   B Natriuretic Peptide 667.7 (*)    All other components within normal limits  RESP PANEL BY RT-PCR (FLU A&B, COVID) ARPGX2  TROPONIN I (HIGH SENSITIVITY)  TROPONIN I (HIGH SENSITIVITY)    EKG EKG Interpretation  Date/Time:  Tuesday October 27 2020 17:01:18 EDT Ventricular Rate:  55 PR Interval:  176 QRS Duration: 84 QT Interval:  484 QTC Calculation: 463 R Axis:   30 Text Interpretation: Sinus bradycardia Possible Left atrial enlargement Borderline ECG Confirmed by Dene Gentry [phone]) on  10/27/2020 8:43:26 PM  Radiology DG Chest 2 View  Result Date: 10/27/2020 CLINICAL DATA:  Shortness of breath. Shortness of breath for several weeks. EXAM: CHEST - 2 VIEW COMPARISON:  01/21/2019 FINDINGS: Post median sternotomy. Stable mild cardiomegaly. Unchanged mediastinal contours. There is a small right pleural effusion. Mild vascular congestion. No pneumothorax or confluent airspace disease. Chronic left rib deformities. Surgical hardware in the lower cervical spine is partially included. IMPRESSION: Small right pleural effusion and mild vascular congestion. Stable mild cardiomegaly post CABG. Electronically Signed   By: Keith Rake M.D.   On: 10/27/2020 18:13    Procedures Procedures   Medications Ordered in ED Medications  furosemide (LASIX) injection 40 mg (40 mg Intravenous Given 10/27/20 2158)    ED Course  I have reviewed the triage vital signs and the nursing notes.  Pertinent labs & imaging results that were available during my care of the patient were reviewed by me and considered in my medical decision making (see chart for details).  Clinical Course as of 10/27/20 2314  Tue Sep 20, 11060  [id number] 75 year old male with complaint of DOE, progressively worsening over the past month, significantly worse today. No CP but is concerned because he was having SHOB only when he had CABG x 4 three years ago. Found to have mild pitting edema to bilateral lower extremities, CXR with mild vascular congestion. EKG sinus  brady. CBC WNL. CMP without significant electrolyte abnormality. BNP elevated at 667.7. Troponin 10. Covid/flu negative. O2 sat 97-100% on room air. Discussed with Dr. Francia Greaves, ER attending, plan is to admit for CHF, given lasix in the ER. Patient agreeable with plan. Discussed with Dr. Posey Pronto with [facility] who will consult for admission.  [LM]  0109 Prior echo from 01/2019 reviewed: Normal LV function with EF 60 to 65%.  Mild LVH, abnormal septal  motion consistent with postoperative state.  Grade 2 diastolic dysfunction.  Severe biatrial enlargement.  Mild to moderate aortic valve sclerosis without stenosis.  Mildly elevated pulmonary artery systolic pressure [LM]    Clinical Course User Index [LM] Roque Lias   MDM Rules/Calculators/A&P                            Final Clinical Impression(s) / ED Diagnoses Final diagnoses:  Acute congestive heart failure, unspecified heart failure type [facility])    Rx / DC Orders ED Discharge Orders     None        Tacy Learn, PA-C 10/27/20 2314    Valarie Merino, MD 10/29/20 1335

## 2020-10-27 NOTE — H&P (Signed)
History and Physical    ZETHAN ALFIERI [id number]:[id number] DOB: 1945-05-14 DOA: 10/27/2020  PCP: Hulan Fess, MD  Patient coming from: Home  I have personally briefly reviewed patient's old medical records in [place]  Chief Complaint: Shortness of breath  HPI: KIYAAN HAQ is a 75 y.o. male with medical history significant for CAD s/p CABG, chronic diastolic CHF (EF 73-41%, G2 DD by TTE 01/23/2019), paroxysmal atrial fibrillation on Eliquis, hypothyroidism, hyperlipidemia, and OSA who presented to the ED for evaluation of progressive shortness of breath.  Patient reports about 2 weeks of progressive shortness of breath occurring with exertion such as walking across his yard or up an incline driveway.  He does well at rest and denies any orthopnea or PND.  He has had some mild swelling above his ankles.  He reports good urine output.  He has not had any lightheadedness, dizziness, nausea, vomiting, cough, abdominal pain, dysuria.  He denies any chest pain although does note that at the time of his CABG he did not have any chest pain then either.  Patient does state that his daughter recently got married so there were a lot of celebrations of these past 2 weeks and he thinks he may have been drinking too much alcohol and eating too much rich food.  He has been otherwise taking all his medications regularly including his Eliquis.  He has not seen any obvious bleeding.  ED Course:  Initial vitals showed BP 189/77, pulse 52, RR 18, temp 98.7 F, SPO2 97% on room air.  Labs show sodium 136, potassium 4.3, bicarb 21, BUN 12, creatinine 0.89, serum glucose 103, LFTs within normal limits, WBC 8.4, hemoglobin 14.5, platelets 198,000, BNP 667.7.  High-sensitivity troponin 10.  SARS-CoV-2 PCR and influenza are negative.  2 view chest x-ray shows prior sternotomy changes with stable mild cardiomegaly, small right pleural effusion and mild vascular congestion.  Patient was given IV Lasix 40 mg once  and the hospitalist service was consulted to admit for further evaluation and management.  Review of Systems: All systems reviewed and are negative except as documented in history of present illness above.   Past Medical History:  Diagnosis Date   Anxiety    takes Xanax daily as needed   Arthritis    CAD (coronary artery disease)    s/p CABG   Cataract    removed both eyes   Chronic diastolic CHF (congestive heart failure) ([place])    Depression    takes Celexa daily   Diverticulitis    Diverticulosis    Enlarged prostate    sightly   Esophagitis    Gastric ulcer    GERD (gastroesophageal reflux disease)    takes Protonix daily   Hx of adenomatous colonic polyps    benign   Hyperlipidemia    takes Zocor daily   Hypothyroidism    Insomnia    takes Ambien nightly   Joint pain    Joint swelling    OSA (obstructive sleep apnea)    Paroxysmal atrial fibrillation ([place])    On Eliquis    Past Surgical History:  Procedure Laterality Date   A-FLUTTER ABLATION N/A 01/11/2019   Procedure: A-FLUTTER ABLATION;  Surgeon: Evans Lance, MD;  Location: [facility];  Service: Cardiovascular;  Laterality: N/A;   CARDIAC CATHETERIZATION     CARDIOVERSION N/A 11/20/2018   Procedure: CARDIOVERSION;  Surgeon: Troy Sine, MD;  Location: [facility];  Service: Cardiovascular;  Laterality: N/A;  cartliage removed from nose  61yrs ago   cataract surgery Bilateral    CERVICAL SPINE SURGERY     COLONOSCOPY     CORONARY ARTERY BYPASS GRAFT N/A 11/16/2017   Procedure: CORONARY ARTERY BYPASS GRAFTING (CABG) x4. LEFT ENDOSCOPIC SAPHENOUS VEIN HARVEST AND MAMMARY ARTERY TAKE DOWN. LIMA TO LAD, SVG TO PDA, SVG TO DISTAL CIRC & OMI.;  Surgeon: [name], MD;  Location: [facility];  Service: Open Heart Surgery;  Laterality: N/A;   EYE SURGERY     FOREIGN BODY REMOVAL ESOPHAGEAL     KNEE SURGERY Left    couple of times   LEFT HEART CATH AND CORONARY ANGIOGRAPHY N/A 11/15/2017    Procedure: LEFT HEART CATH AND CORONARY ANGIOGRAPHY;  Surgeon: Troy Sine, MD;  Location: [facility];  Service: Cardiovascular;  Laterality: N/A;   NASAL SINUS SURGERY     POLYPECTOMY     TEE WITHOUT CARDIOVERSION N/A 11/16/2017   Procedure: TRANSESOPHAGEAL ECHOCARDIOGRAM (TEE);  Surgeon: [name], MD;  Location: [facility];  Service: Open Heart Surgery;  Laterality: N/A;   TONSILLECTOMY     TOTAL HIP ARTHROPLASTY Right 04/17/2020   Procedure: RIGHT TOTAL HIP ARTHROPLASTY ANTERIOR APPROACH;  Surgeon: Mcarthur Rossetti, MD;  Location: [facility];  Service: Orthopedics;  Laterality: Right;   TOTAL KNEE ARTHROPLASTY Left 06/17/2014   Procedure: TOTAL KNEE ARTHROPLASTY;  Surgeon: Garald Balding, MD;  Location: [facility];  Service: Orthopedics;  Laterality: Left;    Social History:  reports that he has never smoked. He has never used smokeless tobacco. He reports current alcohol use of about 10.0 standard drinks per week. He reports that he does not use drugs.  No Known Allergies  Family History  Problem Relation Age of Onset   Colon cancer Paternal Grandfather        questionable   Colon polyps Neg Hx    Esophageal cancer Neg Hx    Rectal cancer Neg Hx    Stomach cancer Neg Hx      Prior to Admission medications   Medication Sig Start Date End Date Taking? Authorizing Provider  acetaminophen (TYLENOL) 500 MG tablet Take 1,000 mg by mouth every 6 (six) hours as needed for moderate pain or headache.    [provider]  acyclovir (ZOVIRAX) 400 MG tablet Take 400 mg by mouth 2 (two) times daily.    [provider]  amoxicillin-clavulanate (AUGMENTIN) 875-125 MG tablet Take 1 tablet by mouth 2 (two) times daily. 10/02/20   Irene Shipper, MD  apixaban (ELIQUIS) 5 MG TABS tablet TAKE 1 TABLET(5 MG) BY MOUTH TWICE DAILY 05/07/20   Troy Sine, MD  aspirin EC 81 MG tablet Take 1 tablet (81 mg total) by mouth daily. 11/13/17   Troy Sine, MD  citalopram  (CELEXA) 20 MG tablet Take 20 mg by mouth daily.    [provider]  ferrous sulfate 325 (65 FE) MG tablet Take 325 mg by mouth daily.    [provider]  icosapent Ethyl (VASCEPA) 1 g capsule TAKE 2 CAPSULES(2 GRAMS) BY MOUTH TWICE DAILY 05/06/20   Troy Sine, MD  levothyroxine (SYNTHROID) 150 MCG tablet TAKE 1 TABLET(150 MCG) BY MOUTH DAILY 06/29/20   Troy Sine, MD  methocarbamol (ROBAXIN) 500 MG tablet Take 1 tablet (500 mg total) by mouth every 6 (six) hours as needed for muscle spasms. Patient not taking: Reported on 08/28/2020 04/18/20   Mcarthur Rossetti, MD  metoprolol  tartrate (LOPRESSOR) 50 MG tablet Take 1 tablet (50 mg total) by mouth 2 (two) times daily. 06/08/20   Troy Sine, MD  Multiple Vitamins-Minerals (PRESERVISION AREDS 2 PO) Take 1 tablet by mouth 2 (two) times daily.    [provider]  nitroGLYCERIN (NITROSTAT) 0.4 MG SL tablet DISSOLVE 1 TABLET UNDER THE TONGUE EVERY 5 MINUTES AS NEEDED FOR CHEST PAIN 10/26/20   Troy Sine, MD  oxyCODONE (ROXICODONE) 5 MG immediate release tablet Take 1 tablet (5 mg total) by mouth 2 (two) times daily as needed for severe pain. Patient not taking: Reported on 08/28/2020 07/16/20   Mcarthur Rossetti, MD  oxymetazoline (AFRIN) 0.05 % nasal spray Place 1 spray into both nostrils 2 (two) times daily as needed for congestion. Patient not taking: Reported on 08/28/2020    [provider]  pantoprazole (PROTONIX) 20 MG tablet TAKE 2 TABLETS(40 MG) BY MOUTH DAILY 05/07/20   Irene Shipper, MD  PRESCRIPTION MEDICATION Apply 1 application topically daily as needed (dark spots). FLUOROURACIL 5% + CALCIPOTRIENE 0.005% 02/17/20   [provider]  rosuvastatin (CRESTOR) 40 MG tablet Take 1 tablet (40 mg total) by mouth daily. 08/24/20   Troy Sine, MD  zolpidem (AMBIEN) 10 MG tablet Take 10 mg by mouth at bedtime.    [provider]    Physical Exam: Vitals:   10/27/20 2032  10/27/20 2115 10/27/20 2200 10/27/20 2245  BP: (!) 179/85 (!) 197/87  (!) 186/86  Pulse: (!) 50 (!) 53  (!) 48  Resp: 14 (!) 27  (!) 21  Temp:      TempSrc:      SpO2: 100% 96%  99%  Weight:   94.8 kg   Height:   5\' 7"  (1.702 m)    Constitutional: Resting in bed with head elevated, NAD, calm, comfortable Eyes: PERRL, lids and conjunctivae normal ENMT: Mucous membranes are moist. Posterior pharynx clear of any exudate or lesions.Normal dentition.  Hard of hearing. Neck: normal, supple, no masses. Respiratory: clear to auscultation bilaterally, no wheezing, no crackles. Normal respiratory effort. No accessory muscle use.  Cardiovascular: bradycardic, no murmurs / rubs / gallops.  Trace edema above both ankles. 2+ pedal pulses. Abdomen: no tenderness, no masses palpated. No hepatosplenomegaly. Bowel sounds positive.  Musculoskeletal: no clubbing / cyanosis. No joint deformity upper and lower extremities. Good ROM, no contractures. Normal muscle tone.  Skin: no rashes, lesions, ulcers. No induration Neurologic: CN 2-12 grossly intact. Sensation intact. Strength 5/5 in all 4.  Psychiatric: Normal judgment and insight. Alert and oriented x 3. Normal mood.   Labs on Admission: I have personally reviewed following labs and imaging studies  CBC: Recent Labs  Lab 10/27/20 1708  WBC 8.4  NEUTROABS 5.7  HGB 14.5  HCT 42.9  MCV 97.9  PLT 277   Basic Metabolic Panel: Recent Labs  Lab 10/27/20 1708  NA 136  K 4.3  CL 103  CO2 21*  GLUCOSE 103*  BUN 12  CREATININE 0.89  CALCIUM 9.2   GFR: Estimated Creatinine Clearance: 77.5 mL/min (by C-G formula based on SCr of 0.89 mg/dL). Liver Function Tests: Recent Labs  Lab 10/27/20 1708  AST 24  ALT 25  ALKPHOS 113  BILITOT 0.9  PROT 6.7  ALBUMIN 3.9   No results for input(s): LIPASE, AMYLASE in the last 168 hours. No results for input(s): AMMONIA in the last 168 hours. Coagulation Profile: No results for input(s): INR, PROTIME  in the last 168  hours. Cardiac Enzymes: No results for input(s): CKTOTAL, CKMB, CKMBINDEX, TROPONINI in the last 168 hours. BNP (last 3 results) No results for input(s): PROBNP in the last 8760 hours. HbA1C: No results for input(s): HGBA1C in the last 72 hours. CBG: No results for input(s): GLUCAP in the last 168 hours. Lipid Profile: No results for input(s): CHOL, HDL, LDLCALC, TRIG, CHOLHDL, LDLDIRECT in the last 72 hours. Thyroid Function Tests: No results for input(s): TSH, T4TOTAL, FREET4, T3FREE, THYROIDAB in the last 72 hours. Anemia Panel: No results for input(s): VITAMINB12, FOLATE, FERRITIN, TIBC, IRON, RETICCTPCT in the last 72 hours. Urine analysis:    Component Value Date/Time   COLORURINE YELLOW 11/15/2017 1952   APPEARANCEUR CLEAR 11/15/2017 1952   LABSPEC 1.020 11/15/2017 1952   PHURINE 6.0 11/15/2017 1952   GLUCOSEU NEGATIVE 11/15/2017 1952   GLUCOSEU NEGATIVE 06/28/2017 0959   HGBUR NEGATIVE 11/15/2017 1952   BILIRUBINUR NEGATIVE 11/15/2017 1952   KETONESUR NEGATIVE 11/15/2017 1952   PROTEINUR NEGATIVE 11/15/2017 1952   UROBILINOGEN 0.2 06/28/2017 0959   NITRITE NEGATIVE 11/15/2017 1952   LEUKOCYTESUR NEGATIVE 11/15/2017 1952    Radiological Exams on Admission: DG Chest 2 View  Result Date: 10/27/2020 CLINICAL DATA:  Shortness of breath. Shortness of breath for several weeks. EXAM: CHEST - 2 VIEW COMPARISON:  01/21/2019 FINDINGS: Post median sternotomy. Stable mild cardiomegaly. Unchanged mediastinal contours. There is a small right pleural effusion. Mild vascular congestion. No pneumothorax or confluent airspace disease. Chronic left rib deformities. Surgical hardware in the lower cervical spine is partially included. IMPRESSION: Small right pleural effusion and mild vascular congestion. Stable mild cardiomegaly post CABG. Electronically Signed   By: Keith Rake M.D.   On: 10/27/2020 18:13    EKG: Personally reviewed. Sinus bradycardia, rate 55, without  acute ischemic changes.  Rate is faster when compared to prior.  Assessment/Plan Principal Problem:   Acute on chronic diastolic CHF (congestive heart failure) ([facility]) Active Problems:   Mixed hyperlipidemia   S/P CABG x 4   OSA (obstructive sleep apnea)   CAD (coronary artery disease)   Hypothyroidism   Paroxysmal atrial fibrillation ([facility])   UZIEL COVAULT is a 75 y.o. male with medical history significant for CAD s/p CABG, chronic diastolic CHF (EF 29-79%, G2 DD by TTE 01/23/2019), paroxysmal atrial fibrillation on Eliquis, hypothyroidism, hyperlipidemia, and OSA who is admitted with acute on chronic diastolic CHF.  Acute on chronic diastolic CHF exacerbation: Presenting with her exertional dyspnea, elevated BNP, slight peripheral edema, and mild edema on CXR.  Denies any chest pain.    He is currently saturating well on room air. -Continue IV Lasix 40 mg twice daily -Monitor strict I/O's and daily weights -Update echocardiogram  Paroxysmal atrial fibrillation with bradycardia: Sinus bradycardia with rates in the 40s on admission, appears chronic.  Will place on lower dose Lopressor 25 mg twice daily (has been on 50 mg twice daily) and monitor response.  Continue Eliquis.  CAD s/p CABG: Appears stable.  Denies any chest pain. Troponin negative x2.  EKG shows sinus bradycardia without acute ischemic changes.  Continue aspirin and rosuvastatin.  Hypothyroidism: Continue Synthroid.  Hyperlipidemia: Continue rosuvastatin.  OSA: States he is no longer using BiPAP due to intolerance.  Use supplemental oxygen if needed during sleep.  DVT prophylaxis: Eliquis Code Status: Full code, confirmed with patient Family Communication: Discussed with patient, he has discussed with family Disposition Plan: From home and likely discharged home pending clinical progress Consults called: None Level of care: Telemetry Cardiac Admission  status:   Status is: Observation  The patient remains OBS  appropriate and will d/c before 2 midnights.  Dispo: The patient is from: Home              Anticipated d/c is to: Home              Patient currently is not medically stable to d/c.   Difficult to place patient No  Zada Finders MD [facility]  If 7PM-7AM, please contact night-coverage [email or web address]  10/27/2020, 11:05 PM

## 2020-10-27 NOTE — ED Triage Notes (Signed)
Pt arrived c/o shortness of breath, several weeks. Pt states it has been getting progressively worse and he can barely walk now  Denies chest pain at this time   Hx of quadruple by pass. Did not have chest pain before that event 3 yrs ago

## 2020-10-28 ENCOUNTER — Observation Stay (HOSPITAL_BASED_OUTPATIENT_CLINIC_OR_DEPARTMENT_OTHER): Payer: PPO

## 2020-10-28 DIAGNOSIS — Z7901 Long term (current) use of anticoagulants: Secondary | ICD-10-CM | POA: Diagnosis not present

## 2020-10-28 DIAGNOSIS — Z7982 Long term (current) use of aspirin: Secondary | ICD-10-CM | POA: Diagnosis not present

## 2020-10-28 DIAGNOSIS — I2510 Atherosclerotic heart disease of native coronary artery without angina pectoris: Secondary | ICD-10-CM | POA: Diagnosis not present

## 2020-10-28 DIAGNOSIS — I5033 Acute on chronic diastolic (congestive) heart failure: Secondary | ICD-10-CM | POA: Diagnosis not present

## 2020-10-28 DIAGNOSIS — I480 Paroxysmal atrial fibrillation: Secondary | ICD-10-CM | POA: Diagnosis not present

## 2020-10-28 DIAGNOSIS — I110 Hypertensive heart disease with heart failure: Secondary | ICD-10-CM | POA: Diagnosis not present

## 2020-10-28 DIAGNOSIS — Z96641 Presence of right artificial hip joint: Secondary | ICD-10-CM | POA: Diagnosis not present

## 2020-10-28 DIAGNOSIS — Z20822 Contact with and (suspected) exposure to covid-19: Secondary | ICD-10-CM | POA: Diagnosis not present

## 2020-10-28 DIAGNOSIS — E039 Hypothyroidism, unspecified: Secondary | ICD-10-CM | POA: Diagnosis not present

## 2020-10-28 DIAGNOSIS — Z96652 Presence of left artificial knee joint: Secondary | ICD-10-CM | POA: Diagnosis not present

## 2020-10-28 DIAGNOSIS — Z951 Presence of aortocoronary bypass graft: Secondary | ICD-10-CM | POA: Diagnosis not present

## 2020-10-28 DIAGNOSIS — Z79899 Other long term (current) drug therapy: Secondary | ICD-10-CM | POA: Diagnosis not present

## 2020-10-28 LAB — BASIC METABOLIC PANEL
Anion gap: 11 (ref 5–15)
BUN: 12 mg/dL (ref 8–23)
CO2: 24 mmol/L (ref 22–32)
Calcium: 9.2 mg/dL (ref 8.9–10.3)
Chloride: 102 mmol/L (ref 98–111)
Creatinine, Ser: 0.91 mg/dL (ref 0.61–1.24)
GFR, Estimated: >60 mL/min (ref >60)
Glucose, Bld: 114 mg/dL — ABNORMAL HIGH (ref 70–99)
Potassium: 3.5 mmol/L (ref 3.5–5.1)
Sodium: 137 mmol/L (ref 135–145)

## 2020-10-28 LAB — ECHOCARDIOGRAM COMPLETE
AR max vel: 2.21 cm2
AV Area VTI: 2.18 cm2
AV Area mean vel: 2.09 cm2
AV Mean grad: 5 mmHg
AV Peak grad: 10.5 mmHg
Ao pk vel: 1.62 m/s
Area-P 1/2: 4.68 cm2
Height: 67 in
MV M vel: 3.21 m/s
MV Peak grad: 41.2 mmHg
P 1/2 time: 481 msec
S' Lateral: 3 cm
Single Plane A4C EF: 69.1 %
Weight: 3344 oz

## 2020-10-28 LAB — CBC
HCT: 42.9 % (ref 39.0–52.0)
Hemoglobin: 14.5 g/dL (ref 13.0–17.0)
MCH: 33.3 pg (ref 26.0–34.0)
MCHC: 33.8 g/dL (ref 30.0–36.0)
MCV: 98.4 fL (ref 80.0–100.0)
Platelets: 218 10*3/uL (ref 150–400)
RBC: 4.36 MIL/uL (ref 4.22–5.81)
RDW: 13.6 % (ref 11.5–15.5)
WBC: 9.5 10*3/uL (ref 4.0–10.5)
nRBC: 0 % (ref 0.0–0.2)

## 2020-10-28 LAB — TROPONIN I (HIGH SENSITIVITY): Troponin I (High Sensitivity): 10 ng/L (ref <18)

## 2020-10-28 LAB — MAGNESIUM: Magnesium: 2.3 mg/dL (ref 1.7–2.4)

## 2020-10-28 MED ORDER — METOPROLOL TARTRATE 25 MG PO TABS
25.0000 mg | ORAL_TABLET | Freq: Two times a day (BID) | ORAL | Status: DC
  Filled 2020-10-28: qty 1

## 2020-10-28 MED ORDER — APIXABAN 5 MG PO TABS
5.0000 mg | ORAL_TABLET | Freq: Two times a day (BID) | ORAL | Status: DC
  Administered 2020-10-28 – 2020-10-29 (×4): 5 mg via ORAL
  Filled 2020-10-28 (×4): qty 1

## 2020-10-28 MED ORDER — ZOLPIDEM TARTRATE 5 MG PO TABS
5.0000 mg | ORAL_TABLET | Freq: Once | ORAL | Status: AC
  Administered 2020-10-29: 5 mg via ORAL
  Filled 2020-10-28: qty 1

## 2020-10-28 MED ORDER — CITALOPRAM HYDROBROMIDE 20 MG PO TABS
20.0000 mg | ORAL_TABLET | Freq: Every day | ORAL | Status: DC
  Administered 2020-10-28 – 2020-10-29 (×2): 20 mg via ORAL
  Filled 2020-10-28: qty 2
  Filled 2020-10-28: qty 1

## 2020-10-28 MED ORDER — FUROSEMIDE 10 MG/ML IJ SOLN
40.0000 mg | Freq: Two times a day (BID) | INTRAMUSCULAR | Status: DC
  Administered 2020-10-28 – 2020-10-29 (×3): 40 mg via INTRAVENOUS
  Filled 2020-10-28 (×3): qty 4

## 2020-10-28 MED ORDER — ONDANSETRON HCL 4 MG PO TABS: 4.0000 mg | ORAL_TABLET | Freq: Four times a day (QID) | ORAL | Status: DC | PRN

## 2020-10-28 MED ORDER — SENNOSIDES-DOCUSATE SODIUM 8.6-50 MG PO TABS: 1.0000 | ORAL_TABLET | Freq: Every evening | ORAL | Status: DC | PRN

## 2020-10-28 MED ORDER — ROSUVASTATIN CALCIUM 20 MG PO TABS
40.0000 mg | ORAL_TABLET | Freq: Every day | ORAL | Status: DC
  Administered 2020-10-28 – 2020-10-29 (×2): 40 mg via ORAL
  Filled 2020-10-28 (×2): qty 2

## 2020-10-28 MED ORDER — ACETAMINOPHEN 325 MG PO TABS: 650.0000 mg | ORAL_TABLET | Freq: Four times a day (QID) | ORAL | Status: DC | PRN

## 2020-10-28 MED ORDER — LEVOTHYROXINE SODIUM 75 MCG PO TABS
150.0000 ug | ORAL_TABLET | Freq: Every day | ORAL | Status: DC
  Administered 2020-10-28 – 2020-10-29 (×2): 150 ug via ORAL
  Filled 2020-10-28 (×2): qty 2

## 2020-10-28 MED ORDER — ASPIRIN EC 81 MG PO TBEC
81.0000 mg | DELAYED_RELEASE_TABLET | Freq: Every day | ORAL | Status: DC
  Administered 2020-10-28 – 2020-10-29 (×2): 81 mg via ORAL
  Filled 2020-10-28 (×2): qty 1

## 2020-10-28 MED ORDER — ACETAMINOPHEN 650 MG RE SUPP: 650.0000 mg | Freq: Four times a day (QID) | RECTAL | Status: DC | PRN

## 2020-10-28 MED ORDER — POTASSIUM CHLORIDE 20 MEQ PO PACK
20.0000 meq | PACK | Freq: Every day | ORAL | Status: DC
  Administered 2020-10-28 – 2020-10-29 (×2): 20 meq via ORAL
  Filled 2020-10-28 (×2): qty 1

## 2020-10-28 MED ORDER — ONDANSETRON HCL 4 MG/2ML IJ SOLN: 4.0000 mg | Freq: Four times a day (QID) | INTRAMUSCULAR | Status: DC | PRN

## 2020-10-28 MED ORDER — SODIUM CHLORIDE 0.9% FLUSH
3.0000 mL | Freq: Two times a day (BID) | INTRAVENOUS | Status: DC
  Administered 2020-10-28 – 2020-10-29 (×3): 3 mL via INTRAVENOUS

## 2020-10-28 NOTE — ED Notes (Signed)
Walked patient to the bathroom patient did well 

## 2020-10-28 NOTE — ED Notes (Signed)
Pt ambulated with steady gait to the bathroom.

## 2020-10-28 NOTE — ED Notes (Signed)
Pt is sitting in recliner chair

## 2020-10-28 NOTE — ED Notes (Signed)
Metoprolol held due to low heart rate 51-55. Dr. Benny Lennert notified and he will order parameters.

## 2020-10-28 NOTE — ED Notes (Signed)
Placed Breakfast order 

## 2020-10-28 NOTE — Progress Notes (Signed)
PROGRESS NOTE  Carl [name] VZD:[id number] DOB: Feb 16, 1945 DOA: 10/27/2020 PCP: Hulan Fess, MD  Brief History   The patient is a 75 yr old man who presented to [facility] ED on 10/27/2020 with complaints of progressive SOB. He carries a past medical history of  CAD s/p CABG, chronic diastolic CHF (EF 29-51%, G2 DD by TTE 01/23/2019), paroxysmal atrial fibrillation on Eliquis, hypothyroidism, hyperlipidemia, and OSA.  In the ED Acute on chronic diastolic CHF exacerbation. The patient was admitted to a telemetry bed, although he is currently being roomed in the ED pending availability of a bed. Beta blockers have been held. He is being monitored on telemetry and is being diuresed.  Consultants  Cardiology  Procedures  None  Antibiotics  None  Subjective  The patient states that he is feeling better. Heart rate remains low, but he is no longer short of breath.  Objective   Vitals:  Vitals:   10/28/20 1334 10/28/20 1335  BP: (!) 184/69   Pulse: 60   Resp: (!) 25   Temp:  98.2 F (36.8 C)  SpO2: 96%     Exam:  Constitutional:  Awake, alert, and oriented x 3. No acute distress. Respiratory:  CTA bilaterally, no w/r/r.  Respiratory effort normal. No retractions or accessory muscle use Cardiovascular:  RRR, no m/r/g Mild b/l lower extremity edema Normal pedal pulses Abdomen:  Abdomen appears normal; no tenderness or masses No hernias No HSM Musculoskeletal:  Digits/nails BUE: no clubbing, cyanosis, petechiae, infection exam of joints, bones, muscles of at least one of following: head/neck, RUE, LUE, RLE, LLE   strength and tone normal, no atrophy, no abnormal movements Skin:  No rashes, lesions, ulcers palpation of skin: no induration or nodules Neurologic:  CN 2-12 intact Sensation all 4 extremities intact Psychiatric:  Mental status Mood, affect appropriate Orientation to person, place, time  judgment and insight appear intact    I have personally reviewed the  following:   Today's Data  Vitals  Lab Data  CBC, BMP  Micro Data  COVID and influenza negative.  Imaging  CXR  Cardiology Data  Echocardiogram EKG  Scheduled Meds:  apixaban  5 mg Oral BID   aspirin EC  81 mg Oral Daily   citalopram  20 mg Oral Daily   furosemide  40 mg Intravenous Q12H   levothyroxine  150 mcg Oral Q0600   metoprolol tartrate  25 mg Oral BID   potassium chloride  20 mEq Oral Daily   rosuvastatin  40 mg Oral Daily   sodium chloride flush  3 mL Intravenous Q12H   Continuous Infusions:  sodium chloride      Principal Problem:   Acute on chronic diastolic CHF (congestive heart failure) ([facility]) Active Problems:   Mixed hyperlipidemia   S/P CABG x 4   OSA (obstructive sleep apnea)   CAD (coronary artery disease)   Hypothyroidism   Paroxysmal atrial fibrillation ([facility])   LOS: 0 days   A & P  Principal Problem:   Acute on chronic diastolic CHF (congestive heart failure) ([facility]) Active Problems:   Mixed hyperlipidemia   S/P CABG x 4   OSA (obstructive sleep apnea)   CAD (coronary artery disease)   Hypothyroidism   Paroxysmal atrial fibrillation ([facility])   [name] is a 75 y.o. male with medical history significant for CAD s/p CABG, chronic diastolic CHF (EF 88-41%, G2 DD by TTE 01/23/2019), paroxysmal atrial fibrillation on Eliquis, hypothyroidism, hyperlipidemia, and OSA who is admitted with  acute on chronic diastolic CHF.   Acute on chronic diastolic CHF exacerbation: Presenting with her exertional dyspnea, elevated BNP, slight peripheral edema, and mild edema on CXR.  Denies any chest pain.    He is currently saturating well on room air. Echocardiogram performed on 10/28/2020 has demonstrated EF of 55-60% with normal LV function and moderate left ventricular hypertrophy. There is evidence for grade II diastolic dysfunction. Regional wall motion cannot be evaluated on this [place]. RV systolic function was mildly reduced  -Continue IV Lasix 40 mg twice  daily -Monitor strict I/O's and daily weights   Paroxysmal atrial fibrillation with bradycardia: Sinus bradycardia with rates in the 40s on admission, appears chronic. Initially, the patient was placed on lower dose Lopressor 25 mg twice daily (has been on 50 mg twice daily). However the patient continued to be bradycardic this morning, although asymptomatic. Lopressor has been stopped. Continue Eliquis. Consult cardiology in the am if bradycardia does not resolved with holding beta blockers.   CAD s/p CABG: Appears stable.  Denies any chest pain. Troponin negative x2.  EKG shows sinus bradycardia without acute ischemic changes.  Continue aspirin and rosuvastatin.   Hypothyroidism: Continue Synthroid.   Hyperlipidemia: Continue rosuvastatin.   OSA: States he is no longer using BiPAP due to intolerance.  Use supplemental oxygen if needed during sleep.  I have seen and examined this patient myself. I have spent 34 minutes in his evaluation and care.   DVT prophylaxis: Eliquis Code Status: Full code, confirmed with patient Family Communication: Discussed with patient, he has discussed with family Disposition Plan: From home and likely discharged home pending clinical progress   Aylana Hirschfeld, DO [facility] Direct contact: see [email or web address]  7PM-7AM contact night coverage as above 10/28/2020, 3:04 PM  LOS: 0 days

## 2020-10-29 ENCOUNTER — Encounter (HOSPITAL_COMMUNITY): Payer: Self-pay | Admitting: Internal Medicine

## 2020-10-29 DIAGNOSIS — I2510 Atherosclerotic heart disease of native coronary artery without angina pectoris: Secondary | ICD-10-CM

## 2020-10-29 DIAGNOSIS — G4733 Obstructive sleep apnea (adult) (pediatric): Secondary | ICD-10-CM | POA: Diagnosis not present

## 2020-10-29 DIAGNOSIS — I5033 Acute on chronic diastolic (congestive) heart failure: Secondary | ICD-10-CM | POA: Diagnosis not present

## 2020-10-29 DIAGNOSIS — Z951 Presence of aortocoronary bypass graft: Secondary | ICD-10-CM

## 2020-10-29 DIAGNOSIS — I480 Paroxysmal atrial fibrillation: Secondary | ICD-10-CM

## 2020-10-29 LAB — CBC WITH DIFFERENTIAL/PLATELET
Abs Immature Granulocytes: 0.03 10*3/uL (ref 0.00–0.07)
Basophils Absolute: 0.1 10*3/uL (ref 0.0–0.1)
Basophils Relative: 1 %
Eosinophils Absolute: 0.2 10*3/uL (ref 0.0–0.5)
Eosinophils Relative: 3 %
HCT: 47.9 % (ref 39.0–52.0)
Hemoglobin: 16.5 g/dL (ref 13.0–17.0)
Immature Granulocytes: 0 %
Lymphocytes Relative: 18 %
Lymphs Abs: 1.3 10*3/uL (ref 0.7–4.0)
MCH: 32.7 pg (ref 26.0–34.0)
MCHC: 34.4 g/dL (ref 30.0–36.0)
MCV: 95 fL (ref 80.0–100.0)
Monocytes Absolute: 1.3 10*3/uL — ABNORMAL HIGH (ref 0.1–1.0)
Monocytes Relative: 18 %
Neutro Abs: 4.4 10*3/uL (ref 1.7–7.7)
Neutrophils Relative %: 60 %
Platelets: 218 10*3/uL (ref 150–400)
RBC: 5.04 MIL/uL (ref 4.22–5.81)
RDW: 13.7 % (ref 11.5–15.5)
WBC: 7.3 10*3/uL (ref 4.0–10.5)
nRBC: 0 % (ref 0.0–0.2)

## 2020-10-29 LAB — BASIC METABOLIC PANEL
Anion gap: 12 (ref 5–15)
BUN: 17 mg/dL (ref 8–23)
CO2: 24 mmol/L (ref 22–32)
Calcium: 9.6 mg/dL (ref 8.9–10.3)
Chloride: 99 mmol/L (ref 98–111)
Creatinine, Ser: 1.1 mg/dL (ref 0.61–1.24)
GFR, Estimated: >60 mL/min (ref >60)
Glucose, Bld: 110 mg/dL — ABNORMAL HIGH (ref 70–99)
Potassium: 3.4 mmol/L — ABNORMAL LOW (ref 3.5–5.1)
Sodium: 135 mmol/L (ref 135–145)

## 2020-10-29 MED ORDER — HYDRALAZINE HCL 20 MG/ML IJ SOLN: 10.0000 mg | Freq: Four times a day (QID) | INTRAMUSCULAR | Status: DC | PRN

## 2020-10-29 MED ORDER — FUROSEMIDE 40 MG PO TABS: 40.0000 mg | ORAL_TABLET | Freq: Every day | ORAL | 0 refills | Status: DC

## 2020-10-29 MED ORDER — POTASSIUM CHLORIDE CRYS ER 20 MEQ PO TBCR
40.0000 meq | EXTENDED_RELEASE_TABLET | Freq: Once | ORAL | Status: AC
  Administered 2020-10-29: 40 meq via ORAL
  Filled 2020-10-29: qty 2

## 2020-10-29 MED ORDER — METOPROLOL TARTRATE 50 MG PO TABS: 25.0000 mg | ORAL_TABLET | Freq: Two times a day (BID) | ORAL | 3 refills | Status: DC

## 2020-10-29 MED ORDER — LISINOPRIL 10 MG PO TABS: 10.0000 mg | ORAL_TABLET | Freq: Every day | ORAL | 0 refills | Status: AC

## 2020-10-29 MED ORDER — POTASSIUM CHLORIDE 20 MEQ PO PACK: 20.0000 meq | PACK | Freq: Every day | ORAL | 0 refills | Status: AC

## 2020-10-29 NOTE — Discharge Summary (Signed)
Physician Discharge Summary  Carl [name] EZM:[id number] DOB: [birth year] DOA: 10/27/2020  PCP: Hulan Fess, MD  Admit date: 10/27/2020 Discharge date: 10/29/2020  Recommendations for Outpatient Follow-up:  Follow up with PCP in 7-10 days. Pt to have chemistry drawn at this visit to be reported to PCP. Follow up with [name] in 2 weeks. 2 gm Na diet.  Discharge Diagnoses: Principal diagnosis is #1 Acute exacerbation of chronic diastolic heart failure  Atrial fibrillation with slow rate. Pulmonary vascular congestion Obstructive sleep apnea Hypothyroidism Hypertension.  Discharge Condition: Fair Disposition: Hme  Diet recommendation: 2 gm Na  Filed Weights   10/27/20 2200 10/28/20 1853 10/29/20 0427  Weight: 94.8 kg 92.1 kg 92.3 kg    History of present illness:  Carl [name] is a 75 y.o. male with medical history significant for CAD s/p CABG, chronic diastolic CHF (EF 50-35%, G2 DD by TTE 01/23/2019), paroxysmal atrial fibrillation on Eliquis, hypothyroidism, hyperlipidemia, and OSA who presented to the ED for evaluation of progressive shortness of breath.  Patient reports about 2 weeks of progressive shortness of breath occurring with exertion such as walking across his yard or up an incline driveway.  He does well at rest and denies any orthopnea or PND.  He has had some mild swelling above his ankles.  He reports good urine output.  He has not had any lightheadedness, dizziness, nausea, vomiting, cough, abdominal pain, dysuria.  He denies any chest pain although does note that at the time of his CABG he did not have any chest pain then either.  Patient does state that his daughter recently got married so there were a lot of celebrations of these past 2 weeks and he thinks he may have been drinking too much alcohol and eating too much rich food.  He has been otherwise taking all his medications regularly including his Eliquis.  He has not seen any obvious bleeding.     Hospital Course:  The patient was admitted to a telemetry bed. His beta blocker was held. He was diuresed with 40 mg lasix IV bid. He is 3L to the negative in terms of fluid balance since admission. Despite being off of metoprolol, his heart rate remains in the mid-upper 50's to low 60's today. Echocardiogram was performed and demonstrated EF 55-60% with normal function, wall motion could not be evaluated, and moderate LVH. There is grade II diastolic dysfunction. RV systolic function is mildly reduced. There is normal pulmonary artery systolic pressure. Moderately dilated left atrium.   Cardiology was consulted. Dr. Debara Pickett recommends putting the patient back on metoprolol at 25 mg bid, continuing lasix, and discharging the patient to home. The patient will follow up with [name], his cardiologist in 2-4 weeks.  The patient will be discharged to home in fair condition today.  Today's assessment: S: The patient is sitting up on the side of the bed. He states that he is feeling well. No new complaints.  O: Vitals:  Vitals:   10/29/20 0918 10/29/20 1100  BP: (!) 152/74   Pulse: 64 69  Resp: 18   Temp:    SpO2: 98% 95%    Constitutional:   The patient is awake, alert, and oriented x 3. No acute distress. Neck:  neck appears normal, no masses, normal ROM, supple no thyromegaly Respiratory:  CTA bilaterally, no w/r/r.  Respiratory effort normal. No retractions or accessory muscle use No tactile fremitus Cardiovascular:  RRR, no m/r/g No LE extremity edema   Normal pedal pulses  Abdomen:  Abdomen appears normal; no tenderness or masses No hernias No HSM Musculoskeletal:  Digits/nails: no clubbing, cyanosis, petechiae, infection exam of joints, bones, muscles of at least one of following: head/neck, RUE, LUE, RLE, LLE   strength and tone normal, no atrophy, no abnormal movements Skin:  No rashes, lesions, ulcers palpation of skin: no induration or nodules Neurologic:  CN 2-12  intact Sensation all 4 extremities intact Psychiatric:  judgement and insight appear normal Mental status Mood, affect appropriate Orientation to person, place, time   Discharge Instructions  Discharge Instructions     (HEART FAILURE PATIENTS) Call MD:  Anytime you have any of the following symptoms: 1) 3 pound weight gain in 24 hours or 5 pounds in 1 week 2) shortness of breath, with or without a dry hacking cough 3) swelling in the hands, feet or stomach 4) if you have to sleep on extra pillows at night in order to breathe.   Complete by: As directed    Call MD for:  difficulty breathing, headache or visual disturbances   Complete by: As directed    Call MD for:  extreme fatigue   Complete by: As directed    Call MD for:  persistant dizziness or light-headedness   Complete by: As directed    Diet - low sodium heart healthy   Complete by: As directed    Discharge instructions   Complete by: As directed    Discharge to home.  2 gm Na diet. Follow up with PCP in 7-10 days. Have chemistry drawn at that visit. Follow up with cardiology as directed.  Weigh yourself daily in the same place, at the same time. Write down weights and call cardiology for weight gain of more than 3 lbs in one day or 5 lbs in one week.   Increase activity slowly   Complete by: As directed       Allergies as of 10/29/2020   No Known Allergies      Medication List     STOP taking these medications    acyclovir 400 MG tablet Commonly known as: ZOVIRAX   amoxicillin-clavulanate 875-125 MG tablet Commonly known as: AUGMENTIN   methocarbamol 500 MG tablet Commonly known as: ROBAXIN   oxyCODONE 5 MG immediate release tablet Commonly known as: Roxicodone   oxymetazoline 0.05 % nasal spray Commonly known as: AFRIN   PRESCRIPTION MEDICATION   zolpidem 10 MG tablet Commonly known as: AMBIEN       TAKE these medications    acetaminophen 500 MG tablet Commonly known as: TYLENOL Take  1,000 mg by mouth every 6 (six) hours as needed for moderate pain or headache.   apixaban 5 MG Tabs tablet Commonly known as: Eliquis TAKE 1 TABLET(5 MG) BY MOUTH TWICE DAILY What changed:  how much to take how to take this when to take this additional instructions   aspirin EC 81 MG tablet Take 1 tablet (81 mg total) by mouth daily.   citalopram 20 MG tablet Commonly known as: CELEXA Take 20 mg by mouth daily.   ferrous sulfate 325 (65 FE) MG tablet Take 325 mg by mouth at bedtime.   furosemide 40 MG tablet Commonly known as: Lasix Take 1 tablet (40 mg total) by mouth daily.   icosapent Ethyl 1 g capsule Commonly known as: VASCEPA TAKE 2 CAPSULES(2 GRAMS) BY MOUTH TWICE DAILY What changed: See the new instructions.   levothyroxine 150 MCG tablet Commonly known as: SYNTHROID TAKE 1 TABLET(150 MCG) BY MOUTH  DAILY What changed: See the new instructions.   lisinopril 10 MG tablet Commonly known as: ZESTRIL Take 1 tablet (10 mg total) by mouth daily.   metoprolol tartrate 50 MG tablet Commonly known as: LOPRESSOR Take 0.5 tablets (25 mg total) by mouth 2 (two) times daily. What changed: how much to take   nitroGLYCERIN 0.4 MG SL tablet Commonly known as: NITROSTAT DISSOLVE 1 TABLET UNDER THE TONGUE EVERY 5 MINUTES AS NEEDED FOR CHEST PAIN What changed:  how much to take how to take this when to take this reasons to take this additional instructions   pantoprazole 20 MG tablet Commonly known as: PROTONIX TAKE 2 TABLETS(40 MG) BY MOUTH DAILY What changed: See the new instructions.   potassium chloride 20 MEQ packet Commonly known as: KLOR-CON Take 20 mEq by mouth daily. Start taking on: October 30, 2020   PRESERVISION AREDS 2 PO Take 1 tablet by mouth 2 (two) times daily.   rosuvastatin 40 MG tablet Commonly known as: CRESTOR Take 1 tablet (40 mg total) by mouth daily.   sildenafil 100 MG tablet Commonly known as: VIAGRA Take 100 mg by mouth daily  as needed for erectile dysfunction.       No Known Allergies  The results of significant diagnostics from this hospitalization (including imaging, microbiology, ancillary and laboratory) are listed below for reference.    Significant Diagnostic Studies: DG Chest 2 View  Result Date: 10/27/2020 CLINICAL DATA:  Shortness of breath. Shortness of breath for several weeks. EXAM: CHEST - 2 VIEW COMPARISON:  01/21/2019 FINDINGS: Post median sternotomy. Stable mild cardiomegaly. Unchanged mediastinal contours. There is a small right pleural effusion. Mild vascular congestion. No pneumothorax or confluent airspace disease. Chronic left rib deformities. Surgical hardware in the lower cervical spine is partially included. IMPRESSION: Small right pleural effusion and mild vascular congestion. Stable mild cardiomegaly post CABG. Electronically Signed   By: Keith Rake M.D.   On: 10/27/2020 18:13   ECHOCARDIOGRAM COMPLETE  Result Date: 10/28/2020    ECHOCARDIOGRAM REPORT   Patient Name:   TREVYON SWOR Date of Exam: 10/28/2020 Medical Rec #:  [id number]   Height:       67.0 in Accession #:    [id number]  Weight:       209.0 lb Date of Birth:  07/21/1945    BSA:          2.060 m Patient Age:    69 years    BP:           186/66 mmHg Patient Gender: M           HR:           40 bpm. Exam Location:  Inpatient Procedure: 2D Echo, Color Doppler and Cardiac Doppler Indications:    I50.30* Unspecified diastolic (congestive) heart failure  History:        Patient has prior history of Echocardiogram examinations, most                 recent 01/23/2019. CHF, CAD; Prior CABG.  Sonographer:    Tawnya Crook Referring Phys: [id number] VISHAL R PATEL  Sonographer Comments: Technically difficult study due to poor echo windows. Definity contrast not attempted. IMPRESSIONS  1. Left ventricular ejection fraction, by estimation, is 55 to 60%. The left ventricle has normal function. Left ventricular endocardial border not optimally  defined to evaluate regional wall motion. There is moderate left ventricular hypertrophy. Left ventricular diastolic parameters are consistent with Grade II diastolic dysfunction (pseudonormalization).  2. Right ventricular systolic function is mildly reduced. The right ventricular size is normal. There is normal pulmonary artery systolic pressure. The estimated right ventricular systolic pressure is 32.2 mmHg.  3. Left atrial size was moderately dilated.  4. The mitral valve is grossly normal. Trivial mitral valve regurgitation.  5. The aortic valve is grossly normal. There is mild calcification of the aortic valve. Aortic valve regurgitation is mild. No aortic stenosis is present.  6. The inferior vena cava is normal in size with greater than 50% respiratory variability, suggesting right atrial pressure of 3 mmHg. FINDINGS  Left Ventricle: Left ventricular ejection fraction, by estimation, is 55 to 60%. The left ventricle has normal function. Left ventricular endocardial border not optimally defined to evaluate regional wall motion. The left ventricular internal cavity size was normal in size. There is moderate left ventricular hypertrophy. Abnormal (paradoxical) septal motion consistent with post-operative status. Left ventricular diastolic parameters are consistent with Grade II diastolic dysfunction (pseudonormalization). Right Ventricle: The right ventricular size is normal. No increase in right ventricular wall thickness. Right ventricular systolic function is mildly reduced. There is normal pulmonary artery systolic pressure. The tricuspid regurgitant velocity is 2.65 m/s, and with an assumed right atrial pressure of 3 mmHg, the estimated right ventricular systolic pressure is 02.5 mmHg. Left Atrium: Left atrial size was moderately dilated. Right Atrium: Right atrial size was normal in size. Pericardium: There is no evidence of pericardial effusion. Mitral Valve: The mitral valve is grossly normal. Trivial  mitral valve regurgitation. Tricuspid Valve: The tricuspid valve is grossly normal. Tricuspid valve regurgitation is trivial. Aortic Valve: The aortic valve is grossly normal. There is mild calcification of the aortic valve. Aortic valve regurgitation is mild. Aortic regurgitation PHT measures 481 msec. No aortic stenosis is present. Aortic valve mean gradient measures 5.0 mmHg. Aortic valve peak gradient measures 10.5 mmHg. Aortic valve area, by VTI measures 2.18 cm. Pulmonic Valve: The pulmonic valve was not well visualized. Pulmonic valve regurgitation is trivial. Aorta: The aortic root is normal in size and structure and the ascending aorta was not well visualized. Venous: The inferior vena cava is normal in size with greater than 50% respiratory variability, suggesting right atrial pressure of 3 mmHg. IAS/Shunts: No atrial level shunt detected by color flow Doppler.  LEFT VENTRICLE PLAX 2D LVIDd:         4.40 cm     Diastology LVIDs:         3.00 cm     LV e' medial:    10.60 cm/s LV PW:         1.40 cm     LV E/e' medial:  9.5 LV IVS:        1.40 cm     LV e' lateral:   9.90 cm/s LVOT diam:     1.90 cm     LV E/e' lateral: 10.2 LV SV:         79 LV SV Index:   38 LVOT Area:     2.84 cm  LV Volumes (MOD) LV vol d, MOD A4C: 75.7 ml LV vol s, MOD A4C: 23.4 ml LV SV MOD A4C:     75.7 ml RIGHT VENTRICLE             IVC RV S prime:     11.20 cm/s  IVC diam: 1.90 cm TAPSE (M-mode): 2.2 cm LEFT ATRIUM              Index  RIGHT ATRIUM           Index LA diam:        4.80 cm  2.33 cm/m  RA Area:     22.10 cm LA Vol (A2C):   106.0 ml 51.45 ml/m RA Volume:   63.70 ml  30.92 ml/m LA Vol (A4C):   71.6 ml  34.75 ml/m LA Biplane Vol: 92.3 ml  44.80 ml/m  AORTIC VALVE                    PULMONIC VALVE AV Area (Vmax):    2.21 cm     PV Vmax:       0.98 m/s AV Area (Vmean):   2.09 cm     PV Peak grad:  3.9 mmHg AV Area (VTI):     2.18 cm AV Vmax:           162.00 cm/s AV Vmean:          106.000 cm/s AV VTI:             0.363 m AV Peak Grad:      10.5 mmHg AV Mean Grad:      5.0 mmHg LVOT Vmax:         126.00 cm/s LVOT Vmean:        78.300 cm/s LVOT VTI:          0.279 m LVOT/AV VTI ratio: 0.77 AI PHT:            481 msec  AORTA Ao Root diam: 3.00 cm MITRAL VALVE                TRICUSPID VALVE MV Area (PHT): 4.68 cm     TR Peak grad:   28.1 mmHg MR Peak grad: 41.2 mmHg     TR Vmax:        265.00 cm/s MR Vmax:      321.00 cm/s MV E velocity: 101.00 cm/s  SHUNTS MV A velocity: 50.80 cm/s   Systemic VTI:  0.28 m MV E/A ratio:  1.99         Systemic Diam: 1.90 cm Cherlynn Kaiser MD Electronically signed by Cherlynn Kaiser MD Signature Date/Time: 10/28/2020/11:33:06 AM    Final     Microbiology: Recent Results (from the past 240 hour(s))  Resp Panel by RT-PCR (Flu A&B, Covid) Nasopharyngeal Swab     Status: None   Collection Time: 10/27/20 10:00 PM   Specimen: Nasopharyngeal Swab; Nasopharyngeal(NP) swabs in vial transport medium  Result Value Ref Range Status   SARS Coronavirus 2 by RT PCR NEGATIVE NEGATIVE Final    Comment: (NOTE) SARS-CoV-2 target nucleic acids are NOT DETECTED.  The SARS-CoV-2 RNA is generally detectable in upper respiratory specimens during the acute phase of infection. The lowest concentration of SARS-CoV-2 viral copies this assay can detect is 138 copies/mL. A negative result does not preclude SARS-Cov-2 infection and should not be used as the sole basis for treatment or other patient management decisions. A negative result may occur with  improper specimen collection/handling, submission of specimen other than nasopharyngeal swab, presence of viral mutation(s) within the areas targeted by this assay, and inadequate number of viral copies(<138 copies/mL). A negative result must be combined with clinical observations, patient history, and epidemiological information. The expected result is Negative.  Fact Sheet for Patients:  EntrepreneurPulse.com.au  Fact Sheet  for Healthcare Providers:  IncredibleEmployment.be  This test is no t yet approved or cleared by the [place]  FDA and  has been authorized for detection and/or diagnosis of SARS-CoV-2 by FDA under an Emergency Use Authorization (EUA). This EUA will remain  in effect (meaning this test can be used) for the duration of the COVID-19 declaration under Section 564(b)(1) of the Act, 21 U.S.C.section 360bbb-3(b)(1), unless the authorization is terminated  or revoked sooner.       Influenza A by PCR NEGATIVE NEGATIVE Final   Influenza B by PCR NEGATIVE NEGATIVE Final    Comment: (NOTE) The Xpert Xpress SARS-CoV-2/FLU/RSV plus assay is intended as an aid in the diagnosis of influenza from Nasopharyngeal swab specimens and should not be used as a sole basis for treatment. Nasal washings and aspirates are unacceptable for Xpert Xpress SARS-CoV-2/FLU/RSV testing.  Fact Sheet for Patients: EntrepreneurPulse.com.au  Fact Sheet for Healthcare Providers: IncredibleEmployment.be  This test is not yet approved or cleared by the [place] FDA and has been authorized for detection and/or diagnosis of SARS-CoV-2 by FDA under an Emergency Use Authorization (EUA). This EUA will remain in effect (meaning this test can be used) for the duration of the COVID-19 declaration under Section 564(b)(1) of the Act, 21 U.S.C. section 360bbb-3(b)(1), unless the authorization is terminated or revoked.  Performed at [facility], [place] [address] [place], [place] [address]      Labs: Basic Metabolic Panel: Recent Labs  Lab 10/27/20 1708 10/28/20 0331 10/29/20 0347  NA 136 137 135  K 4.3 3.5 3.4*  CL 103 102 99  CO2 21* 24 24  GLUCOSE 103* 114* 110*  BUN 12 12 17   CREATININE 0.89 0.91 1.10  CALCIUM 9.2 9.2 9.6  MG  --  2.3  --    Liver Function Tests: Recent Labs  Lab 10/27/20 1708  AST 24  ALT 25  ALKPHOS 113  BILITOT 0.9   PROT 6.7  ALBUMIN 3.9   No results for input(s): LIPASE, AMYLASE in the last 168 hours. No results for input(s): AMMONIA in the last 168 hours. CBC: Recent Labs  Lab 10/27/20 1708 10/28/20 0331 10/29/20 0347  WBC 8.4 9.5 7.3  NEUTROABS 5.7  --  4.4  HGB 14.5 14.5 16.5  HCT 42.9 42.9 47.9  MCV 97.9 98.4 95.0  PLT 198 218 218   Cardiac Enzymes: No results for input(s): CKTOTAL, CKMB, CKMBINDEX, TROPONINI in the last 168 hours. BNP: BNP (last 3 results) Recent Labs    10/27/20 1708  BNP 667.7*    ProBNP (last 3 results) No results for input(s): PROBNP in the last 8760 hours.  CBG: No results for input(s): GLUCAP in the last 168 hours.  Principal Problem:   Acute on chronic diastolic CHF (congestive heart failure) (HCC) Active Problems:   Mixed hyperlipidemia   S/P CABG x 4   OSA (obstructive sleep apnea)   CAD (coronary artery disease)   Hypothyroidism   Paroxysmal atrial fibrillation ([place])   Time coordinating discharge: 38 minutes  Signed:        Dawanda Mapel, DO [facility]  10/29/2020, 4:40 PM

## 2020-10-29 NOTE — Progress Notes (Signed)
Mobility Specialist Progress Note:   10/29/20 1100  Therapy Vitals  Pulse Rate 69  Oxygen Therapy  SpO2 95 %  Mobility  Activity Ambulated in hall  Range of Motion/Exercises Active;All extremities  Level of Assistance Standby assist, set-up cues, supervision of patient - no hands on  Minutes Ambulated 5 minutes  Distance Ambulated (ft) 520 ft  Mobility Ambulated independently in hallway  Mobility Response Tolerated well  Mobility performed by Mobility specialist  Bed Position Chair  Transport method Ambulatory  $Mobility charge 1 Mobility   Pre Mobility:O2 95%, HR 69 bpm During Mobility: O2 96%, HR 80 bpm Post Mobility: O2 96%, HR 62 bpm  Pt was received in bed eager for mobility. Ambulated in hall 520' with no AD and supervision. Very fast and steady gait. Pt was left in chair with all needs met.   Nelta Numbers Mobility Specialist  Phone [phone]

## 2020-10-29 NOTE — Progress Notes (Signed)
Heart Failure Nurse Navigator Progress Note  PCP: Hulan Fess, MD PCP-Cardiologist: Corky Downs., MD Admission Diagnosis: A/C dCHF Admitted from: home alone  Presentation:   Carl [name] presented 9/20 with increased SOB x 3 weeks. Pt interactive with interview process. Sitting on side of bed with glasses on, room air. Pt speaking in full sentences without difficulty/no SOB noted. Pt has hearing aids at bedside, but needs charger, HOH. Pt states he has had increased SOB x3 weeks, so much so that he has stopped working out with his trainer at the gym. Noticed increased work of breathing when walking up inclined driveway. Pt states his daughter was married this past weekend in [place] and he partook in many party festivities/foods and drink. On average drinks 2 alcoholic beverages daily. Stated most of his fluid was built up around the mid section as his stomach is flat after IV lasix.  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 Pt has significant other and has good relationships with previous spouse. Pt states he is good friends/neighbors with his cardiologist, [name]. Explained plan for potential HV TOC appt if unable to get quick cardiology appt, pt agreeable.   ECHO/ LVEF: 55-60%, mod LV hypertrophy, G2DD. RV mildly reduced, LA mod dilated.   Clinical Course:  Past Medical History:  Diagnosis Date   Anxiety    takes Xanax daily as needed   Arthritis    CAD (coronary artery disease)    s/p CABG   Cataract    removed both eyes   Chronic diastolic CHF (congestive heart failure) (HCC)    Depression    takes Celexa daily   Diverticulitis    Diverticulosis    Enlarged prostate    sightly   Esophagitis    Gastric ulcer    GERD (gastroesophageal reflux disease)    takes Protonix daily   Hx of adenomatous colonic polyps    benign   Hyperlipidemia    takes Zocor daily   Hypothyroidism    Insomnia    takes Ambien nightly   Joint pain     Joint swelling    OSA (obstructive sleep apnea)    Paroxysmal atrial fibrillation (HCC)    On Eliquis     Social History   Socioeconomic History   Marital status: Single    Spouse name: Not on file   Number of children: 3   Years of education: Not on file   Highest education level: Some college, no degree  Occupational History   Occupation: Financial controller  Tobacco Use   Smoking status: Never   Smokeless tobacco: Never  Vaping Use   Vaping Use: Never used  Substance and Sexual Activity   Alcohol use: Yes    Alcohol/week: 10.0 standard drinks    Types: 10 Standard drinks or equivalent per week    Comment: 2 drinks 5 nights per week   Drug use: No   Sexual activity: Never  Other Topics Concern   Not on file  Social History Narrative   Not on file   Social Determinants of Health   Financial Resource Strain: Low Risk    Difficulty of Paying Living Expenses: Not hard at all  Food Insecurity: No Food Insecurity   Worried About Charity fundraiser in the Last Year: Never true   [place] in the Last Year: Never true  Transportation Needs: No Transportation Needs   Lack of Transportation (Medical): No  Lack of Transportation (Non-Medical): No  Physical Activity: Sufficiently Active   Days of Exercise per Week: 3 days   Minutes of Exercise per Session: 60 min  Stress: Not on file  Social Connections: Not on file    High Risk Criteria for Readmission and/or Poor Patient Outcomes: Heart failure hospital admissions (last 6 months): 1  No Show rate: 1% Difficult social situation: no Demonstrates medication adherence: yes Primary Language: English Literacy level: able to read/write and comprehend. Wears glasses.  Education Assessment and Provision:  Detailed education and instructions provided on heart failure disease management including the following:  Signs and symptoms of Heart Failure When to call the physician Importance of daily weights Low sodium diet Fluid  restriction Medication management Anticipated future follow-up appointments  Patient education given on each of the above topics.  Patient acknowledges understanding via teach back method and acceptance of all instructions.  Education Materials:  "Living Better With Heart Failure" Booklet, HF zone tool, & Daily Weight Tracker Tool.  Patient has scale at home: yes Patient has pill box at home: yes   Barriers of Care:   -none  Considerations/Referrals:   Referral made to Heart Failure Pharmacist Stewardship: no Referral made to Heart Failure CSW/NCM TOC: no Referral made to Heart & Vascular TOC clinic: pending.  Items for Follow-up on DC/TOC: -optimize -diet/fluid modifications   Pricilla Holm, MSN, RN Heart Failure Nurse Navigator [phone]

## 2020-10-29 NOTE — Consult Note (Addendum)
Cardiology Consultation:   Patient ID: Carl [name] MRN: [id number]; DOB: [birth year]  Admit date: 10/27/2020 Date of Consult: 10/29/2020  PCP:  Hulan Fess, MD   [facility] Providers Cardiologist:  Shelva Majestic, MD     Patient Profile:   [name] is a 75 y.o. male with a hx of CAD s/p 4v CABG (LIMA-LAD, SVG-OM, SVG-dLCx, SVG-PDA), HTN, HLD, hypothyroidism, Paroxysmal AFib on Eliquis, OSA who is being seen 10/29/2020 for the evaluation of bradycardia/pulmonary congestion at the request of Dr. Benny Lennert.  History of Present Illness:   Mr. [name] is a 75 yo male noted above.  He has been followed by [name] as an outpatient.  In fall 2019 he began having shortness of breath, underwent echo which showed an EF of 65 to 70% but grade 2 diastolic dysfunction.  CT coronary angiogram was suggestive of significant multivessel CAD with positive FFR.  He was found to have significant multivessel CAD on cardiac catheterization which led to urgent 4v CABG revascularization done by Dr. Servando Snare.  His postoperative course was complicated by paroxysmal atrial fibrillation and was started on amiodarone along with Eliquis.  He was seen back in the office and found to have sinus bradycardia with plans to decrease his amiodarone to 200 mg daily and was ultimately stopped when he was found to have elevated LFTs and elevated TSH levels.  He was eventually placed on thyroid supplement. He developed episodes of atrial flutter and underwent cardioversion in October 2020.  Continue to have recurrent episodes of atrial flutter and was referred to Dr. Lovena Le with successful atrial flutter ablation 01/2019.  Due to concerns for obstructive sleep apnea he underwent sleep study which was positive.  He has been followed by [name] for this as well.  He was last seen in the office on 08/03/2020 after calling in reporting increased fatigue the week prior. In the office he was noted to be bradycardic. He reported that his  symptoms actually had resolved by the time of his appointment.  It was discussed that his beta-blocker may need to be further reduced if symptoms reoccurred.  Presented to the ED on with progressive shortness of breath. Reported progressive dyspnea about 2 days prior to admission. Says he was having trouble with simple task around his home, getting "winded" very easily. In talking with him, his daughter got married about a week ago. Says he his diet was poor and drank pretty heavily. Noted he just didn't "feel well" since that time. No chest pain.   Labs in the ED showed Na+ 136, K+ 4.3, Cr 0.89, BNP 667, WBC 8.4, Hgb 14.5, hsTn 10>>10. EKG showed SB 55 bpm. CXR showed small right pleural effusion, mild vascular congestion. He was admitted to [facility], placed on IV lasix. Echo this admission showed EF of 55-60%, G2DD, moderately dilated left atrium.   Past Medical History:  Diagnosis Date   Anxiety    takes Xanax daily as needed   Arthritis    CAD (coronary artery disease)    s/p CABG   Cataract    removed both eyes   Chronic diastolic CHF (congestive heart failure) ([place])    Depression    takes Celexa daily   Diverticulitis    Diverticulosis    Enlarged prostate    sightly   Esophagitis    Gastric ulcer    GERD (gastroesophageal reflux disease)    takes Protonix daily   Hx of adenomatous colonic polyps    benign  Hyperlipidemia    takes Zocor daily   Hypothyroidism    Insomnia    takes Ambien nightly   Joint pain    Joint swelling    OSA (obstructive sleep apnea)    Paroxysmal atrial fibrillation (HCC)    On Eliquis    Past Surgical History:  Procedure Laterality Date   A-FLUTTER ABLATION N/A 01/11/2019   Procedure: A-FLUTTER ABLATION;  Surgeon: Evans Lance, MD;  Location: [place] [facility];  Service: Cardiovascular;  Laterality: N/A;   CARDIAC CATHETERIZATION     CARDIOVERSION N/A 11/20/2018   Procedure: CARDIOVERSION;  Surgeon: Troy Sine, MD;  Location: [facility]  ENDOSCOPY;  Service: Cardiovascular;  Laterality: N/A;   cartliage removed from nose  60yrs ago   cataract surgery Bilateral    CERVICAL SPINE SURGERY     COLONOSCOPY     CORONARY ARTERY BYPASS GRAFT N/A 11/16/2017   Procedure: CORONARY ARTERY BYPASS GRAFTING (CABG) x4. LEFT ENDOSCOPIC SAPHENOUS VEIN HARVEST AND MAMMARY ARTERY TAKE DOWN. LIMA TO LAD, SVG TO PDA, SVG TO DISTAL CIRC & OMI.;  Surgeon: [name], MD;  Location: [place];  Service: Open Heart Surgery;  Laterality: N/A;   EYE SURGERY     FOREIGN BODY REMOVAL ESOPHAGEAL     KNEE SURGERY Left    couple of times   LEFT HEART CATH AND CORONARY ANGIOGRAPHY N/A 11/15/2017   Procedure: LEFT HEART CATH AND CORONARY ANGIOGRAPHY;  Surgeon: Troy Sine, MD;  Location: [place] [facility];  Service: Cardiovascular;  Laterality: N/A;   NASAL SINUS SURGERY     POLYPECTOMY     TEE WITHOUT CARDIOVERSION N/A 11/16/2017   Procedure: TRANSESOPHAGEAL ECHOCARDIOGRAM (TEE);  Surgeon: [name], MD;  Location: [place];  Service: Open Heart Surgery;  Laterality: N/A;   TONSILLECTOMY     TOTAL HIP ARTHROPLASTY Right 04/17/2020   Procedure: RIGHT TOTAL HIP ARTHROPLASTY ANTERIOR APPROACH;  Surgeon: Mcarthur Rossetti, MD;  Location: [facility];  Service: Orthopedics;  Laterality: Right;   TOTAL KNEE ARTHROPLASTY Left 06/17/2014   Procedure: TOTAL KNEE ARTHROPLASTY;  Surgeon: Garald Balding, MD;  Location: [place];  Service: Orthopedics;  Laterality: Left;     Home Medications:  Prior to Admission medications   Medication Sig Start Date End Date Taking? Authorizing Provider  acetaminophen (TYLENOL) 500 MG tablet Take 1,000 mg by mouth every 6 (six) hours as needed for moderate pain or headache.   Yes [provider]  acyclovir (ZOVIRAX) 400 MG tablet Take 400 mg by mouth 2 (two) times daily.   Yes [provider]  apixaban (ELIQUIS) 5 MG TABS tablet TAKE 1 TABLET(5 MG) BY MOUTH TWICE DAILY Patient taking differently:  Take 5 mg by mouth 2 (two) times daily. 05/07/20  Yes Troy Sine, MD  aspirin EC 81 MG tablet Take 1 tablet (81 mg total) by mouth daily. 11/13/17  Yes Troy Sine, MD  citalopram (CELEXA) 20 MG tablet Take 20 mg by mouth daily.   Yes [provider]  ferrous sulfate 325 (65 FE) MG tablet Take 325 mg by mouth at bedtime.   Yes [provider]  icosapent Ethyl (VASCEPA) 1 g capsule TAKE 2 CAPSULES(2 GRAMS) BY MOUTH TWICE DAILY Patient taking differently: Take 2 g by mouth 2 (two) times daily. 05/06/20  Yes Troy Sine, MD  levothyroxine (SYNTHROID) 150 MCG tablet TAKE 1 TABLET(150 MCG) BY MOUTH DAILY Patient taking differently: Take 150 mcg by mouth daily before breakfast. 06/29/20  Yes Troy Sine, MD  metoprolol tartrate (LOPRESSOR) 50 MG tablet Take 1 tablet (50 mg total) by mouth 2 (two) times daily. 06/08/20  Yes Troy Sine, MD  Multiple Vitamins-Minerals (PRESERVISION AREDS 2 PO) Take 1 tablet by mouth 2 (two) times daily.   Yes [provider]  nitroGLYCERIN (NITROSTAT) 0.4 MG SL tablet DISSOLVE 1 TABLET UNDER THE TONGUE EVERY 5 MINUTES AS NEEDED FOR CHEST PAIN Patient taking differently: Place 0.4 mg under the tongue every 5 (five) minutes as needed for chest pain. 10/26/20  Yes Troy Sine, MD  oxyCODONE (ROXICODONE) 5 MG immediate release tablet Take 1 tablet (5 mg total) by mouth 2 (two) times daily as needed for severe pain. 07/16/20  Yes Mcarthur Rossetti, MD  oxymetazoline (AFRIN) 0.05 % nasal spray Place 1 spray into both nostrils 2 (two) times daily as needed for congestion.   Yes [provider]  pantoprazole (PROTONIX) 20 MG tablet TAKE 2 TABLETS(40 MG) BY MOUTH DAILY Patient taking differently: Take 40 mg by mouth daily. 05/07/20  Yes Irene Shipper, MD  PRESCRIPTION MEDICATION Apply 1 application topically daily as needed (dark spots). FLUOROURACIL 5% + CALCIPOTRIENE 0.005% 02/17/20  Yes [provider]  rosuvastatin  (CRESTOR) 40 MG tablet Take 1 tablet (40 mg total) by mouth daily. 08/24/20  Yes Troy Sine, MD  sildenafil (VIAGRA) 100 MG tablet Take 100 mg by mouth daily as needed for erectile dysfunction. 06/25/20  Yes [provider]  zolpidem (AMBIEN) 10 MG tablet Take 10 mg by mouth at bedtime.   Yes [provider]  amoxicillin-clavulanate (AUGMENTIN) 875-125 MG tablet Take 1 tablet by mouth 2 (two) times daily. 10/02/20   Irene Shipper, MD  methocarbamol (ROBAXIN) 500 MG tablet Take 1 tablet (500 mg total) by mouth every 6 (six) hours as needed for muscle spasms. Patient not taking: No sig reported 04/18/20   Mcarthur Rossetti, MD    Inpatient Medications: Scheduled Meds:  apixaban  5 mg Oral BID   aspirin EC  81 mg Oral Daily   citalopram  20 mg Oral Daily   furosemide  40 mg Intravenous Q12H   levothyroxine  150 mcg Oral Q0600   potassium chloride  20 mEq Oral Daily   rosuvastatin  40 mg Oral Daily   sodium chloride flush  3 mL Intravenous Q12H   Continuous Infusions:  PRN Meds: acetaminophen **OR** acetaminophen, hydrALAZINE, ondansetron **OR** ondansetron (ZOFRAN) IV, senna-docusate  Allergies:   No Known Allergies  Social History:   Social History   Socioeconomic History   Marital status: Single    Spouse name: Not on file   Number of children: 3   Years of education: Not on file   Highest education level: Some college, no degree  Occupational History   Occupation: Financial controller  Tobacco Use   Smoking status: Never   Smokeless tobacco: Never  Vaping Use   Vaping Use: Never used  Substance and Sexual Activity   Alcohol use: Yes    Alcohol/week: 10.0 standard drinks    Types: 10 Standard drinks or equivalent per week    Comment: 2 drinks 5 nights per week   Drug use: No   Sexual activity: Never  Other Topics Concern   Not on file  Social History Narrative   Not on file   Social Determinants of Health   Financial Resource Strain: Low Risk     Difficulty of Paying Living Expenses: Not hard at all  Food Insecurity: No Food Insecurity   Worried About Charity fundraiser in the Last Year: Never true   Ran Out of Food in the Last Year: Never true  Transportation Needs: No Transportation Needs   Lack of Transportation (Medical): No   Lack of Transportation (Non-Medical): No  Physical Activity: Sufficiently Active   Days of Exercise per Week: 3 days   Minutes of Exercise per Session: 60 min  Stress: Not on file  Social Connections: Not on file  Intimate Partner Violence: Not on file    Family History:    Family History  Problem Relation Age of Onset   Colon cancer Paternal Grandfather        questionable   Colon polyps Neg Hx    Esophageal cancer Neg Hx    Rectal cancer Neg Hx    Stomach cancer Neg Hx      ROS:  Please see the history of present illness.   All other ROS reviewed and negative.     Physical Exam/Data:   Vitals:   10/29/20 0017 10/29/20 0427 10/29/20 0918 10/29/20 1100  BP: (!) 162/61 136/63 (!) 152/74   Pulse: 60 (!) 56 64 69  Resp: 18 18 18    Temp: 98 F (36.7 C) 98 F (36.7 C)    TempSrc: Oral Oral    SpO2: 98% 96% 98% 95%  Weight:  92.3 kg    Height:        Intake/Output Summary (Last 24 hours) at 10/29/2020 1331 Last data filed at 10/29/2020 0600 Gross per 24 hour  Intake 483 ml  Output 500 ml  Net -17 ml   Last 3 Weights 10/29/2020 10/28/2020 10/27/2020  Weight (lbs) 203 lb 6.4 oz 203 lb 209 lb  Weight (kg) 92.262 kg 92.08 kg 94.802 kg     Body mass index is 31.86 kg/m.  General:  Well nourished, well developed, in no acute distress HEENT: normal Neck: no JVD Vascular: No carotid bruits; Distal pulses 2+ bilaterally Cardiac:  normal S1, S2; RRR; no murmur  Lungs:  clear to auscultation bilaterally, no wheezing, rhonchi or rales  Abd: soft, nontender, no hepatomegaly  Ext: no edema Musculoskeletal:  No deformities, BUE and BLE strength normal and equal Skin: warm and dry  Neuro:   CNs 2-12 intact, no focal abnormalities noted Psych:  Normal affect   EKG:  The EKG was personally reviewed and demonstrates:  SB 55 bpm  Telemetry:  Telemetry was personally reviewed and demonstrates:  SB-SR rates in the 50-60 range  Relevant CV Studies:  Echo: 10/28/20  IMPRESSIONS     1. Left ventricular ejection fraction, by estimation, is 55 to 60%. The  left ventricle has normal function. Left ventricular endocardial border  not optimally defined to evaluate regional wall motion. There is moderate  left ventricular hypertrophy. Left  ventricular diastolic parameters are consistent with Grade II diastolic  dysfunction (pseudonormalization).   2. Right ventricular systolic function is mildly reduced. The right  ventricular size is normal. There is normal pulmonary artery systolic  pressure. The estimated right ventricular systolic pressure is 70.1 mmHg.   3. Left atrial size was moderately dilated.   4. The mitral valve is grossly normal. Trivial mitral valve  regurgitation.   5. The aortic valve is grossly normal. There is mild calcification of the  aortic valve. Aortic valve regurgitation is mild. No aortic stenosis is  present.   6. The inferior vena cava is normal in size with greater than 50%  respiratory variability, suggesting right atrial pressure of 3 mmHg.   FINDINGS   Left Ventricle: Left ventricular ejection fraction, by estimation, is 55  to 60%. The left ventricle has normal function. Left ventricular  endocardial border not optimally defined to evaluate regional wall motion.  The left ventricular internal cavity  size was normal in size. There is moderate left ventricular hypertrophy.  Abnormal (paradoxical) septal motion consistent with post-operative  status. Left ventricular diastolic parameters are consistent with Grade II  diastolic dysfunction  (pseudonormalization).   Right Ventricle: The right ventricular size is normal. No increase in  right  ventricular wall thickness. Right ventricular systolic function is  mildly reduced. There is normal pulmonary artery systolic pressure. The  tricuspid regurgitant velocity is 2.65  m/s, and with an assumed right atrial pressure of 3 mmHg, the estimated  right ventricular systolic pressure is 99.2 mmHg.   Left Atrium: Left atrial size was moderately dilated.   Right Atrium: Right atrial size was normal in size.   Pericardium: There is no evidence of pericardial effusion.   Mitral Valve: The mitral valve is grossly normal. Trivial mitral valve  regurgitation.   Tricuspid Valve: The tricuspid valve is grossly normal. Tricuspid valve  regurgitation is trivial.   Aortic Valve: The aortic valve is grossly normal. There is mild  calcification of the aortic valve. Aortic valve regurgitation is mild.  Aortic regurgitation PHT measures 481 msec. No aortic stenosis is present.  Aortic valve mean gradient measures 5.0  mmHg. Aortic valve peak gradient measures 10.5 mmHg. Aortic valve area, by  VTI measures 2.18 cm.   Pulmonic Valve: The pulmonic valve was not well visualized. Pulmonic valve  regurgitation is trivial.   Aorta: The aortic root is normal in size and structure and the ascending  aorta was not well visualized.   Venous: The inferior vena cava is normal in size with greater than 50%  respiratory variability, suggesting right atrial pressure of 3 mmHg.   IAS/Shunts: No atrial level shunt detected by color flow Doppler.   Laboratory Data:  High Sensitivity Troponin:   Recent Labs  Lab 10/27/20 2101 10/27/20 2259  TROPONINIHS 10 10     Chemistry Recent Labs  Lab 10/27/20 1708 10/28/20 0331 10/29/20 0347  NA 136 137 135  K 4.3 3.5 3.4*  CL 103 102 99  CO2 21* 24 24  GLUCOSE 103* 114* 110*  BUN 12 12 17   CREATININE 0.89 0.91 1.10  CALCIUM 9.2 9.2 9.6  MG  --  2.3  --   GFRNONAA >60 >60 >60  ANIONGAP 12 11 12     Recent Labs  Lab 10/27/20 1708  PROT 6.7   ALBUMIN 3.9  AST 24  ALT 25  ALKPHOS 113  BILITOT 0.9   Lipids No results for input(s): CHOL, TRIG, HDL, LABVLDL, LDLCALC, CHOLHDL in the last 168 hours.  Hematology Recent Labs  Lab 10/27/20 1708 10/28/20 0331 10/29/20 0347  WBC 8.4 9.5 7.3  RBC 4.38 4.36 5.04  HGB 14.5 14.5 16.5  HCT 42.9 42.9 47.9  MCV 97.9 98.4 95.0  MCH 33.1 33.3 32.7  MCHC 33.8 33.8 34.4  RDW 13.6 13.6 13.7  PLT 198 218 218   Thyroid No results for input(s): TSH, FREET4 in the last 168 hours.  BNP Recent Labs  Lab 10/27/20 1708  BNP 667.7*    DDimer No results for input(s): DDIMER in the last 168 hours.   Radiology/Studies:  DG Chest 2 View  Result Date: 10/27/2020 CLINICAL  DATA:  Shortness of breath. Shortness of breath for several weeks. EXAM: CHEST - 2 VIEW COMPARISON:  01/21/2019 FINDINGS: Post median sternotomy. Stable mild cardiomegaly. Unchanged mediastinal contours. There is a small right pleural effusion. Mild vascular congestion. No pneumothorax or confluent airspace disease. Chronic left rib deformities. Surgical hardware in the lower cervical spine is partially included. IMPRESSION: Small right pleural effusion and mild vascular congestion. Stable mild cardiomegaly post CABG. Electronically Signed   By: Keith Rake M.D.   On: 10/27/2020 18:13   ECHOCARDIOGRAM COMPLETE  Result Date: 10/28/2020    ECHOCARDIOGRAM REPORT   Patient Name:   Carl [name] Date of Exam: 10/28/2020 Medical Rec #:  [id number]   Height:       67.0 in Accession #:    [id number]  Weight:       209.0 lb Date of Birth:  Feb 28, 1945    BSA:          2.060 m Patient Age:    9 years    BP:           186/66 mmHg Patient Gender: M           HR:           40 bpm. Exam Location:  Inpatient Procedure: 2D Echo, Color Doppler and Cardiac Doppler Indications:    I50.30* Unspecified diastolic (congestive) heart failure  History:        Patient has prior history of Echocardiogram examinations, most                 recent 01/23/2019.  CHF, CAD; Prior CABG.  Sonographer:    Tawnya Crook Referring Phys: [id number] VISHAL R PATEL  Sonographer Comments: Technically difficult study due to poor echo windows. Definity contrast not attempted. IMPRESSIONS  1. Left ventricular ejection fraction, by estimation, is 55 to 60%. The left ventricle has normal function. Left ventricular endocardial border not optimally defined to evaluate regional wall motion. There is moderate left ventricular hypertrophy. Left ventricular diastolic parameters are consistent with Grade II diastolic dysfunction (pseudonormalization).  2. Right ventricular systolic function is mildly reduced. The right ventricular size is normal. There is normal pulmonary artery systolic pressure. The estimated right ventricular systolic pressure is 35.4 mmHg.  3. Left atrial size was moderately dilated.  4. The mitral valve is grossly normal. Trivial mitral valve regurgitation.  5. The aortic valve is grossly normal. There is mild calcification of the aortic valve. Aortic valve regurgitation is mild. No aortic stenosis is present.  6. The inferior vena cava is normal in size with greater than 50% respiratory variability, suggesting right atrial pressure of 3 mmHg. FINDINGS  Left Ventricle: Left ventricular ejection fraction, by estimation, is 55 to 60%. The left ventricle has normal function. Left ventricular endocardial border not optimally defined to evaluate regional wall motion. The left ventricular internal cavity size was normal in size. There is moderate left ventricular hypertrophy. Abnormal (paradoxical) septal motion consistent with post-operative status. Left ventricular diastolic parameters are consistent with Grade II diastolic dysfunction (pseudonormalization). Right Ventricle: The right ventricular size is normal. No increase in right ventricular wall thickness. Right ventricular systolic function is mildly reduced. There is normal pulmonary artery systolic pressure. The tricuspid  regurgitant velocity is 2.65 m/s, and with an assumed right atrial pressure of 3 mmHg, the estimated right ventricular systolic pressure is 65.6 mmHg. Left Atrium: Left atrial size was moderately dilated. Right Atrium: Right atrial size was normal in size. Pericardium: There is no evidence of  pericardial effusion. Mitral Valve: The mitral valve is grossly normal. Trivial mitral valve regurgitation. Tricuspid Valve: The tricuspid valve is grossly normal. Tricuspid valve regurgitation is trivial. Aortic Valve: The aortic valve is grossly normal. There is mild calcification of the aortic valve. Aortic valve regurgitation is mild. Aortic regurgitation PHT measures 481 msec. No aortic stenosis is present. Aortic valve mean gradient measures 5.0 mmHg. Aortic valve peak gradient measures 10.5 mmHg. Aortic valve area, by VTI measures 2.18 cm. Pulmonic Valve: The pulmonic valve was not well visualized. Pulmonic valve regurgitation is trivial. Aorta: The aortic root is normal in size and structure and the ascending aorta was not well visualized. Venous: The inferior vena cava is normal in size with greater than 50% respiratory variability, suggesting right atrial pressure of 3 mmHg. IAS/Shunts: No atrial level shunt detected by color flow Doppler.  LEFT VENTRICLE PLAX 2D LVIDd:         4.40 cm     Diastology LVIDs:         3.00 cm     LV e' medial:    10.60 cm/s LV PW:         1.40 cm     LV E/e' medial:  9.5 LV IVS:        1.40 cm     LV e' lateral:   9.90 cm/s LVOT diam:     1.90 cm     LV E/e' lateral: 10.2 LV SV:         79 LV SV Index:   38 LVOT Area:     2.84 cm  LV Volumes (MOD) LV vol d, MOD A4C: 75.7 ml LV vol s, MOD A4C: 23.4 ml LV SV MOD A4C:     75.7 ml RIGHT VENTRICLE             IVC RV S prime:     11.20 cm/s  IVC diam: 1.90 cm TAPSE (M-mode): 2.2 cm LEFT ATRIUM              Index       RIGHT ATRIUM           Index LA diam:        4.80 cm  2.33 cm/m  RA Area:     22.10 cm LA Vol (A2C):   106.0 ml 51.45 ml/m  RA Volume:   63.70 ml  30.92 ml/m LA Vol (A4C):   71.6 ml  34.75 ml/m LA Biplane Vol: 92.3 ml  44.80 ml/m  AORTIC VALVE                    PULMONIC VALVE AV Area (Vmax):    2.21 cm     PV Vmax:       0.98 m/s AV Area (Vmean):   2.09 cm     PV Peak grad:  3.9 mmHg AV Area (VTI):     2.18 cm AV Vmax:           162.00 cm/s AV Vmean:          106.000 cm/s AV VTI:            0.363 m AV Peak Grad:      10.5 mmHg AV Mean Grad:      5.0 mmHg LVOT Vmax:         126.00 cm/s LVOT Vmean:        78.300 cm/s LVOT VTI:          0.279 m LVOT/AV  VTI ratio: 0.77 AI PHT:            481 msec  AORTA Ao Root diam: 3.00 cm MITRAL VALVE                TRICUSPID VALVE MV Area (PHT): 4.68 cm     TR Peak grad:   28.1 mmHg MR Peak grad: 41.2 mmHg     TR Vmax:        265.00 cm/s MR Vmax:      321.00 cm/s MV E velocity: 101.00 cm/s  SHUNTS MV A velocity: 50.80 cm/s   Systemic VTI:  0.28 m MV E/A ratio:  1.99         Systemic Diam: 1.90 cm Cherlynn Kaiser MD Electronically signed by Cherlynn Kaiser MD Signature Date/Time: 10/28/2020/11:33:06 AM    Final      Assessment and Plan:   Carl [name] is a 75 y.o. male with a hx of CAD s/p 4v CABG (LIMA-LAD, SVG-OM, SVG-dLCx, SVG-PDA), HTN, HLD, hypothyroidism, Paroxysmal AFib on Eliquis, OSA who is being seen 10/29/2020 for the evaluation of bradycardia/pulmonary congestion at the request of Dr. Benny Lennert.  Acute on Chronic diastolic HF: BNP 030, and CXR with small right sided pleural effusion. Echo with normal EF but G2DD. Given IV lasix, now net - 3.3L. Feeling much better. Reports poor dietary discretion PTA as his daughter was recently married and they had several large social events. Lots of food and drinking. He was not on a diuretic PTA.  -- discussed low Na+ diet and monitoring fluid intake -- would plan for daily oral lasix 20mg  at the time of discharge with K+ supplement and arrange of follow up in the office  Paroxysmal Afib/Aflutter/bradycardia: post op Afib from CABG. Also with  aflutter ablation in 2020. Noted to have sinus bradycardia on EKGs in the office historically. Was on amiodarone for period of time but stopped in the setting of elevated LFTs and TSH.  -- He has not had any syncope, denies lightheadedness or dizziness. Seems to have been tolerating BB therapy as an outpatient. No concerning arrhythmias or high degree AVB on telemetry -- metoprolol held on admission, would plan to resume at lower dose of 25mg  BID given his Afib/flutter hx -- continue Eliquis 5mg  BID  CAD s/p 4v CABG '19: No anginal symptoms -- on ASA 81mg  daily, Crestor 40mg  daily. Restart metoprolol at 25mg  BID  Hypothyroidism: on synthroid   OSA: follows with [name]. Has trouble with Cpap mask in the past, uses O2 at night.  Arranged for office follow up on 9/30 with Jory Sims   Risk Assessment/Risk Scores:    New York Heart Association (NYHA) Functional Class NYHA Class II  CHA2DS2-VASc Score = 6  This indicates a 9.7% annual risk of stroke. The patient's score is based upon: CHF History: 0 HTN History: 1 Diabetes History: 0 Stroke History: 2 Vascular Disease History: 1 Age Score: 2 Gender Score: 0   For questions or updates, please contact [facility] Please consult [email or web address] for contact info under    Signed, Reino Bellis, NP  10/29/2020 1:31 PM

## 2020-10-30 NOTE — Progress Notes (Signed)
Heart Failure Nurse Navigator Progress Note  No HV TOC appt placed as patient has f/u at [facility] office on Friday 9/30.   Pricilla Holm, MSN, RN Heart Failure Nurse Navigator [phone]

## 2020-11-02 DIAGNOSIS — E032 Hypothyroidism due to medicaments and other exogenous substances: Secondary | ICD-10-CM | POA: Diagnosis not present

## 2020-11-02 DIAGNOSIS — L299 Pruritus, unspecified: Secondary | ICD-10-CM | POA: Diagnosis not present

## 2020-11-02 DIAGNOSIS — G4700 Insomnia, unspecified: Secondary | ICD-10-CM | POA: Diagnosis not present

## 2020-11-02 DIAGNOSIS — I10 Essential (primary) hypertension: Secondary | ICD-10-CM | POA: Diagnosis not present

## 2020-11-02 DIAGNOSIS — M720 Palmar fascial fibromatosis [Dupuytren]: Secondary | ICD-10-CM | POA: Diagnosis not present

## 2020-11-02 DIAGNOSIS — I480 Paroxysmal atrial fibrillation: Secondary | ICD-10-CM | POA: Diagnosis not present

## 2020-11-02 DIAGNOSIS — I509 Heart failure, unspecified: Secondary | ICD-10-CM | POA: Diagnosis not present

## 2020-11-02 DIAGNOSIS — Z6832 Body mass index (BMI) 32.0-32.9, adult: Secondary | ICD-10-CM | POA: Diagnosis not present

## 2020-11-05 NOTE — Progress Notes (Signed)
Cardiology Office Note   Date:  11/06/2020   ID:  Carl [name], DOB December 17, 1945, MRN [id number]  PCP:  Carl Fess, MD  Cardiologist:  [name] CC: Follow Up Hospitalization CHF   History of Present Illness: Carl [name] is a 75 y.o. male who presents for ongoing assessment and management of coronary artery disease status post four-vessel CABG (LIMA to LAD, SVG to OM, SVG to diagonal circumflex, and SVG to PDA in 2019), with other history to include hypertension, hyperlipidemia, hypothyroidism, paroxysmal atrial fibrillation on Eliquis, and OSA.  The patient was admitted on 10/29/2020 for evaluation of bradycardia and pulmonary congestion at the request of Dr. Benny Lennert.  I  t was noted in the past, that the patient had been on amiodarone in the past for control of atrial fibrillation but this was ultimately stopped when he was found to have elevated LFTs and elevated TSH levels.  He did undergo cardioversion October 2020 and had continued to have recurrent episodes of atrial flutter.He was referred to Dr. Lovena Le and had a successful atrial flutter ablation in December 2020.  Due to concerns for obstructive sleep apnea he did undergo a sleep study and was found to be positive for OSA followed by [name].  On arrival to the ED the patient was found to have slightly elevated BNP of 667, elevated white blood cells of 8.4 hemoglobin 14.5 with his troponin greater than 10 with sinus bradycardia 55 bpm.  Chest x-ray showed a small right pleural effusion with mild vascular congestion.  Echocardiogram during admission revealed an EF of 55 to 60% with grade 2 diastolic dysfunction and moderately dilated left atrium.  Cardiology was consulted.  It was noted that his weight was 209 pounds.  He was diagnosed with acute on chronic diastolic heart failure placed on IV Lasix and diuresed greater than 3 L.  It was noted that this may have been exacerbated by dietary indiscretion as his daughter had recently been married  and he was attending celebrations for this.  He was discharged on 20 mg of Lasix daily with potassium supplement.  He was continued on beta-blocker with reduced dose to 25 mg twice daily and continued on Eliquis 5 mg twice daily.  He was also continues statin therapy.  He was to follow-up with [name] concerning CPAP titration as he was having trouble with the CPAP mask in the past.  He comes today feeling "great" breathing much better has lost about 12 pounds.  He followed up with primary care who doubled his Lasix dose from 40 mg daily to 40 mg twice daily.  She did do follow-up labs which we are requesting.  He denies any cramping, palpitations, or edema.  He is medically compliant and is avoiding salt.  Past Medical History:  Diagnosis Date   Anxiety    takes Xanax daily as needed   Arthritis    CAD (coronary artery disease)    s/p CABG   Cataract    removed both eyes   Chronic diastolic CHF (congestive heart failure) ([place])    Depression    takes Celexa daily   Diverticulitis    Diverticulosis    Enlarged prostate    sightly   Esophagitis    Gastric ulcer    GERD (gastroesophageal reflux disease)    takes Protonix daily   Hx of adenomatous colonic polyps    benign   Hyperlipidemia    takes Zocor daily   Hypothyroidism    Insomnia  takes Ambien nightly   Joint pain    Joint swelling    OSA (obstructive sleep apnea)    Paroxysmal atrial fibrillation ([place])    On Eliquis    Past Surgical History:  Procedure Laterality Date   A-FLUTTER ABLATION N/A 01/11/2019   Procedure: A-FLUTTER ABLATION;  Surgeon: Evans Lance, MD;  Location: [place] [facility];  Service: Cardiovascular;  Laterality: N/A;   CARDIAC CATHETERIZATION     CARDIOVERSION N/A 11/20/2018   Procedure: CARDIOVERSION;  Surgeon: Troy Sine, MD;  Location: [facility];  Service: Cardiovascular;  Laterality: N/A;   cartliage removed from nose  29yrs ago   cataract surgery Bilateral    CERVICAL SPINE  SURGERY     COLONOSCOPY     CORONARY ARTERY BYPASS GRAFT N/A 11/16/2017   Procedure: CORONARY ARTERY BYPASS GRAFTING (CABG) x4. LEFT ENDOSCOPIC SAPHENOUS VEIN HARVEST AND MAMMARY ARTERY TAKE DOWN. LIMA TO LAD, SVG TO PDA, SVG TO DISTAL CIRC & OMI.;  Surgeon: [name], MD;  Location: [place];  Service: Open Heart Surgery;  Laterality: N/A;   EYE SURGERY     FOREIGN BODY REMOVAL ESOPHAGEAL     KNEE SURGERY Left    couple of times   LEFT HEART CATH AND CORONARY ANGIOGRAPHY N/A 11/15/2017   Procedure: LEFT HEART CATH AND CORONARY ANGIOGRAPHY;  Surgeon: Troy Sine, MD;  Location: [place] [facility];  Service: Cardiovascular;  Laterality: N/A;   NASAL SINUS SURGERY     POLYPECTOMY     TEE WITHOUT CARDIOVERSION N/A 11/16/2017   Procedure: TRANSESOPHAGEAL ECHOCARDIOGRAM (TEE);  Surgeon: [name], MD;  Location: [place];  Service: Open Heart Surgery;  Laterality: N/A;   TONSILLECTOMY     TOTAL HIP ARTHROPLASTY Right 04/17/2020   Procedure: RIGHT TOTAL HIP ARTHROPLASTY ANTERIOR APPROACH;  Surgeon: Mcarthur Rossetti, MD;  Location: [facility];  Service: Orthopedics;  Laterality: Right;   TOTAL KNEE ARTHROPLASTY Left 06/17/2014   Procedure: TOTAL KNEE ARTHROPLASTY;  Surgeon: Garald Balding, MD;  Location: [place];  Service: Orthopedics;  Laterality: Left;     Current Outpatient Medications  Medication Sig Dispense Refill   acetaminophen (TYLENOL) 500 MG tablet Take 1,000 mg by mouth every 6 (six) hours as needed for moderate pain or headache.     apixaban (ELIQUIS) 5 MG TABS tablet TAKE 1 TABLET(5 MG) BY MOUTH TWICE DAILY (Patient taking differently: Take 5 mg by mouth 2 (two) times daily.) 180 tablet 1   aspirin EC 81 MG tablet Take 1 tablet (81 mg total) by mouth daily. 90 tablet 3   citalopram (CELEXA) 20 MG tablet Take 20 mg by mouth daily.     ferrous sulfate 325 (65 FE) MG tablet Take 325 mg by mouth at bedtime.     icosapent Ethyl (VASCEPA) 1 g capsule TAKE 2 CAPSULES(2  GRAMS) BY MOUTH TWICE DAILY (Patient taking differently: Take 2 g by mouth 2 (two) times daily.) 360 capsule 1   levothyroxine (SYNTHROID) 150 MCG tablet TAKE 1 TABLET(150 MCG) BY MOUTH DAILY (Patient taking differently: Take 150 mcg by mouth daily before breakfast.) 90 tablet 2   lisinopril (ZESTRIL) 10 MG tablet Take 1 tablet (10 mg total) by mouth daily. 30 tablet 0   metoprolol tartrate (LOPRESSOR) 50 MG tablet Take 0.5 tablets (25 mg total) by mouth 2 (two) times daily. 15 tablet 3   Multiple Vitamins-Minerals (PRESERVISION AREDS 2 PO) Take 1 tablet by mouth 2 (two) times daily.  nitroGLYCERIN (NITROSTAT) 0.4 MG SL tablet DISSOLVE 1 TABLET UNDER THE TONGUE EVERY 5 MINUTES AS NEEDED FOR CHEST PAIN (Patient taking differently: Place 0.4 mg under the tongue every 5 (five) minutes as needed for chest pain.) 50 tablet 1   pantoprazole (PROTONIX) 20 MG tablet TAKE 2 TABLETS(40 MG) BY MOUTH DAILY (Patient taking differently: Take 40 mg by mouth daily.) 180 tablet 1   potassium chloride (KLOR-CON) 20 MEQ packet Take 20 mEq by mouth daily. 30 each 0   rosuvastatin (CRESTOR) 40 MG tablet Take 1 tablet (40 mg total) by mouth daily. 90 tablet 3   sildenafil (VIAGRA) 100 MG tablet Take 100 mg by mouth daily as needed for erectile dysfunction.     furosemide (LASIX) 40 MG tablet Take 1 tablet (40 mg total) by mouth 2 (two) times daily. 60 tablet 6   No current facility-administered medications for this visit.    Allergies:   Patient has no known allergies.    Social History:  The patient  reports that he has never smoked. He has never used smokeless tobacco. He reports current alcohol use of about 10.0 standard drinks per week. He reports that he does not use drugs.   Family History:  The patient's family history includes Colon cancer in his paternal grandfather.    ROS: All other systems are reviewed and negative. Unless otherwise mentioned in H&P    PHYSICAL EXAM: VS:  BP 124/60 (BP Location:  Left Arm, Patient Position: Sitting, Cuff Size: Normal)   Pulse (!) 54   Ht 5\' 7"  (1.702 m)   Wt 204 lb 3.2 oz (92.6 kg)   SpO2 100%   BMI 31.98 kg/m  , BMI Body mass index is 31.98 kg/m. GEN: Well nourished, well developed, in no acute distress HEENT: normal Neck: no JVD, carotid bruits, or masses Cardiac: RRR; soft 1/6 systolic murmurs, rubs, or gallops,no edema  Respiratory:  Clear to auscultation bilaterally, normal work of breathing GI: soft, nontender, nondistended, + BS MS: no deformity or atrophy Skin: warm and dry, no rash Neuro:  Strength and sensation are intact Psych: euthymic mood, full affect   EKG: Not completed this office visit  Recent Labs: 08/03/2020: TSH 2.520 10/27/2020: ALT 25; B Natriuretic Peptide 667.7 10/28/2020: Magnesium 2.3 November 11, 2020: BUN 17; Creatinine, Ser 1.10; Hemoglobin 16.5; Platelets 218; Potassium 3.4; Sodium 135    Lipid Panel    Component Value Date/Time   CHOL 158 08/03/2020 1444   TRIG 172 (H) 08/03/2020 1444   HDL 46 08/03/2020 1444   CHOLHDL 3.4 08/03/2020 1444   CHOLHDL 3.4 01/29/2007 0410   VLDL 17 01/29/2007 0410   LDLCALC 83 08/03/2020 1444      Wt Readings from Last 3 Encounters:  11/06/20 204 lb 3.2 oz (92.6 kg)  11-11-20 203 lb 6.4 oz (92.3 kg)  08/28/20 206 lb (93.4 kg)      Other studies Reviewed: Echocardiogram 11/11/2020  1. Left ventricular ejection fraction, by estimation, is 55 to 60%. The  left ventricle has normal function. Left ventricular endocardial border  not optimally defined to evaluate regional wall motion. There is moderate  left ventricular hypertrophy. Left  ventricular diastolic parameters are consistent with Grade II diastolic  dysfunction (pseudonormalization).   2. Right ventricular systolic function is mildly reduced. The right  ventricular size is normal. There is normal pulmonary artery systolic  pressure. The estimated right ventricular systolic pressure is 18.2 mmHg.   3. Left atrial  size was moderately dilated.   4.  The mitral valve is grossly normal. Trivial mitral valve  regurgitation.   5. The aortic valve is grossly normal. There is mild calcification of the  aortic valve. Aortic valve regurgitation is mild. No aortic stenosis is  present.   6. The inferior vena cava is normal in size with greater than 50%  respiratory variability, suggesting right atrial pressure of 3 mmHg.    Atrial Flutter Ablation 01/11/2019 1. Isthmus-dependent right atrial flutter upon presentation.  2. Successful radiofrequency ablation of atrial flutter along the cavotricuspid isthmus with complete bidirectional isthmus block achieved.  3. No inducible arrhythmias following ablation.  4. No early apparent complications.    Cristopher Peru, MD  12:37 PM 01/11/2019   Left Heart Cath 11/15/2017 Prox RCA to Mid RCA lesion is 60% stenosed. Mid RCA lesion is 50% stenosed. Prox Cx to Mid Cx lesion is 100% stenosed. Ost 2nd Mrg lesion is 80% stenosed. 2nd Mrg lesion is 90% stenosed. Ost 1st Mrg to 1st Mrg lesion is 50% stenosed. Dist LM to Ost LAD lesion is 85% stenosed. Prox LAD to Mid LAD lesion is 75% stenosed.   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   LVEDP 12 mm.  Previous echo documentation of hyperdynamic LV function with an EF of 65 to 70%.   Suspect high right radial artery take-off with stenosis/spasm above the elbow and retrograde filling of brachial artery  ASSESSMENT AND PLAN:  1.  Coronary artery disease: Most recent cardiac catheterization as discussed above.  Continue medical  management.  Secondary prevention with statin therapy, low-cholesterol diet and exercise.  2.  Acute on chronic diastolic heart failure: Patient was diuresed during recent hospitalization with IV Lasix.  He was sent home on 20 mg of Lasix daily but this is been increased to 40 mg twice daily by PCP.  I would recommend follow-up BMET in 3 months to evaluate his current kidney status especially since he is on an ACE inhibitor.  He has seen primary care who was from recent labs and we will request those copies to review.  He is to continue daily weights and salt avoidance  3.  Paroxysmal atrial fibrillation: Regular rhythm on auscultation.  He remains on apixaban 5 mg twice daily and metoprolol for heart rate control.  No changes in his regimen.  4.  Hypothyroidism: Remains on Synthroid replacement followed by PCP.  Current medicines are reviewed at length with the patient today.  I have spent 25 minutes  dedicated to the care of this patient on the date of this encounter to include pre-visit review of records, assessment, management and diagnostic testing,with shared decision making.  Labs/ tests ordered today include: Repeat BMET in 3 months.   Phill Myron. West Pugh, ANP, [facility]   11/06/2020 11:34 AM    [facility] [place] [address] Office [phone] Fax [phone]  Notice: This dictation was prepared with Dragon dictation along with smaller phrase technology. Any transcriptional errors that result from this process are unintentional and may not be corrected upon review.

## 2020-11-06 ENCOUNTER — Other Ambulatory Visit: Payer: Self-pay

## 2020-11-06 ENCOUNTER — Encounter: Payer: Self-pay | Admitting: Adult Health

## 2020-11-06 ENCOUNTER — Ambulatory Visit: Payer: PPO | Admitting: Adult Health

## 2020-11-06 VITALS — BP 124/60 | HR 54 | Ht 67.0 in | Wt 204.2 lb

## 2020-11-06 DIAGNOSIS — I480 Paroxysmal atrial fibrillation: Secondary | ICD-10-CM

## 2020-11-06 DIAGNOSIS — I5033 Acute on chronic diastolic (congestive) heart failure: Secondary | ICD-10-CM | POA: Diagnosis not present

## 2020-11-06 DIAGNOSIS — E039 Hypothyroidism, unspecified: Secondary | ICD-10-CM | POA: Diagnosis not present

## 2020-11-06 DIAGNOSIS — Z951 Presence of aortocoronary bypass graft: Secondary | ICD-10-CM | POA: Diagnosis not present

## 2020-11-06 DIAGNOSIS — I10 Essential (primary) hypertension: Secondary | ICD-10-CM

## 2020-11-06 DIAGNOSIS — E7800 Pure hypercholesterolemia, unspecified: Secondary | ICD-10-CM | POA: Diagnosis not present

## 2020-11-06 MED ORDER — FUROSEMIDE 40 MG PO TABS: 40.0000 mg | ORAL_TABLET | Freq: Two times a day (BID) | ORAL | 6 refills | Status: DC

## 2020-11-06 NOTE — Patient Instructions (Signed)
Medication Instructions:  The current medical regimen is effective;  continue present plan and medications as directed. Please refer to the Current Medication list given to you today.   *If you need a refill on your cardiac medications before your next appointment, please call your pharmacy*  Lab Work:   Testing/Procedures:  NONE    NONE  Follow-Up: Your next appointment:  4 month(s) In Person with You may see Shelva Majestic, MDKATHRYN Purcell Nails, DNP or one of the following Advanced Practice Providers on your designated Care Team:   Almyra Deforest, PA-C Fabian Sharp, [place] or  Roby Lofts, PA-C   At [facility], you and your health needs are our priority.  As part of our continuing mission to provide you with exceptional heart care, we have created designated Provider Care Teams.  These Care Teams include your primary Cardiologist (physician) and Advanced Practice Providers (APPs -  Physician Assistants and Nurse Practitioners) who all work together to provide you with the care you need, when you need it.

## 2020-11-10 DIAGNOSIS — Z0000 Encounter for general adult medical examination without abnormal findings: Secondary | ICD-10-CM | POA: Diagnosis not present

## 2020-11-16 ENCOUNTER — Other Ambulatory Visit: Payer: Self-pay | Admitting: Cardiovascular Disease

## 2020-11-16 ENCOUNTER — Other Ambulatory Visit: Payer: Self-pay | Admitting: Internal Medicine

## 2020-11-16 NOTE — Telephone Encounter (Signed)
Prescription refill request for Eliquis received. Indication:atrial fib Last office visit:9/22 Scr:1.1 Age: 75 Weight:92.6 kg  Prescription refilled

## 2020-11-18 DIAGNOSIS — M720 Palmar fascial fibromatosis [Dupuytren]: Secondary | ICD-10-CM | POA: Diagnosis not present

## 2020-11-18 DIAGNOSIS — G5603 Carpal tunnel syndrome, bilateral upper limbs: Secondary | ICD-10-CM | POA: Diagnosis not present

## 2020-11-18 DIAGNOSIS — G5601 Carpal tunnel syndrome, right upper limb: Secondary | ICD-10-CM | POA: Diagnosis not present

## 2020-11-21 DIAGNOSIS — G5603 Carpal tunnel syndrome, bilateral upper limbs: Secondary | ICD-10-CM | POA: Diagnosis not present

## 2020-11-24 DIAGNOSIS — G5603 Carpal tunnel syndrome, bilateral upper limbs: Secondary | ICD-10-CM | POA: Diagnosis not present

## 2020-11-24 DIAGNOSIS — M720 Palmar fascial fibromatosis [Dupuytren]: Secondary | ICD-10-CM | POA: Diagnosis not present

## 2020-11-26 ENCOUNTER — Ambulatory Visit: Payer: Self-pay

## 2020-11-26 ENCOUNTER — Encounter: Payer: Self-pay | Admitting: Orthopaedic Surgery

## 2020-11-26 ENCOUNTER — Ambulatory Visit (INDEPENDENT_AMBULATORY_CARE_PROVIDER_SITE_OTHER): Payer: PPO | Admitting: Orthopaedic Surgery

## 2020-11-26 DIAGNOSIS — Z96641 Presence of right artificial hip joint: Secondary | ICD-10-CM | POA: Diagnosis not present

## 2020-11-26 NOTE — Progress Notes (Signed)
The patient is now 7 months status post a right total hip arthroplasty.  He says his right hip is doing very well and he has no issues with that at all and sometimes does not feel that he has had a hip replacement.  He walks without a limp.  His right operative hip and his left hip move the same and are smooth with no blocks to rotation and full rotation.  An AP pelvis and lateral of the right hip shows a well-seated total hip arthroplasty with no complicating features.  NuPrep at this point follow-up can be as needed since he is doing so well.  All questions and concerns were answered and addressed.

## 2020-12-02 ENCOUNTER — Telehealth: Payer: Self-pay

## 2020-12-02 ENCOUNTER — Other Ambulatory Visit: Payer: Self-pay

## 2020-12-02 ENCOUNTER — Emergency Department (HOSPITAL_BASED_OUTPATIENT_CLINIC_OR_DEPARTMENT_OTHER): Payer: PPO

## 2020-12-02 ENCOUNTER — Emergency Department (HOSPITAL_BASED_OUTPATIENT_CLINIC_OR_DEPARTMENT_OTHER)
Admission: EM | Admit: 2020-12-02 | Discharge: 2020-12-03 | Disposition: A | Discharge status: Home / Self Care | Payer: PPO | Attending: Emergency Medicine | Admitting: Emergency Medicine

## 2020-12-02 ENCOUNTER — Encounter (HOSPITAL_BASED_OUTPATIENT_CLINIC_OR_DEPARTMENT_OTHER): Payer: Self-pay

## 2020-12-02 DIAGNOSIS — S301XXA Contusion of abdominal wall, initial encounter: Secondary | ICD-10-CM | POA: Diagnosis not present

## 2020-12-02 DIAGNOSIS — Z79899 Other long term (current) drug therapy: Secondary | ICD-10-CM | POA: Insufficient documentation

## 2020-12-02 DIAGNOSIS — I5033 Acute on chronic diastolic (congestive) heart failure: Secondary | ICD-10-CM | POA: Insufficient documentation

## 2020-12-02 DIAGNOSIS — M7981 Nontraumatic hematoma of soft tissue: Secondary | ICD-10-CM | POA: Insufficient documentation

## 2020-12-02 DIAGNOSIS — Z7901 Long term (current) use of anticoagulants: Secondary | ICD-10-CM | POA: Insufficient documentation

## 2020-12-02 DIAGNOSIS — I2510 Atherosclerotic heart disease of native coronary artery without angina pectoris: Secondary | ICD-10-CM | POA: Insufficient documentation

## 2020-12-02 DIAGNOSIS — I110 Hypertensive heart disease with heart failure: Secondary | ICD-10-CM | POA: Diagnosis not present

## 2020-12-02 DIAGNOSIS — Z9229 Personal history of other drug therapy: Secondary | ICD-10-CM

## 2020-12-02 DIAGNOSIS — R102 Pelvic and perineal pain: Secondary | ICD-10-CM | POA: Diagnosis present

## 2020-12-02 DIAGNOSIS — T148XXA Other injury of unspecified body region, initial encounter: Secondary | ICD-10-CM

## 2020-12-02 DIAGNOSIS — G4733 Obstructive sleep apnea (adult) (pediatric): Secondary | ICD-10-CM | POA: Diagnosis not present

## 2020-12-02 DIAGNOSIS — K5730 Diverticulosis of large intestine without perforation or abscess without bleeding: Secondary | ICD-10-CM | POA: Diagnosis not present

## 2020-12-02 LAB — COMPREHENSIVE METABOLIC PANEL
ALT: 51 U/L — ABNORMAL HIGH (ref 0–44)
AST: 57 U/L — ABNORMAL HIGH (ref 15–41)
Albumin: 4.2 g/dL (ref 3.5–5.0)
Alkaline Phosphatase: 103 U/L (ref 38–126)
Anion gap: 11 (ref 5–15)
BUN: 17 mg/dL (ref 8–23)
CO2: 26 mmol/L (ref 22–32)
Calcium: 9 mg/dL (ref 8.9–10.3)
Chloride: 98 mmol/L (ref 98–111)
Creatinine, Ser: 0.96 mg/dL (ref 0.61–1.24)
GFR, Estimated: >60 mL/min (ref >60)
Glucose, Bld: 94 mg/dL (ref 70–99)
Potassium: 3.9 mmol/L (ref 3.5–5.1)
Sodium: 135 mmol/L (ref 135–145)
Total Bilirubin: 0.7 mg/dL (ref 0.3–1.2)
Total Protein: 7 g/dL (ref 6.5–8.1)

## 2020-12-02 LAB — PROTIME-INR
INR: 1.1 (ref 0.8–1.2)
Prothrombin Time: 13.7 seconds (ref 11.4–15.2)

## 2020-12-02 LAB — CBC WITH DIFFERENTIAL/PLATELET
Abs Immature Granulocytes: 0.06 10*3/uL (ref 0.00–0.07)
Basophils Absolute: 0.1 10*3/uL (ref 0.0–0.1)
Basophils Relative: 1 %
Eosinophils Absolute: 0.3 10*3/uL (ref 0.0–0.5)
Eosinophils Relative: 2 %
HCT: 41.3 % (ref 39.0–52.0)
Hemoglobin: 14.2 g/dL (ref 13.0–17.0)
Immature Granulocytes: 1 %
Lymphocytes Relative: 15 %
Lymphs Abs: 1.8 10*3/uL (ref 0.7–4.0)
MCH: 32 pg (ref 26.0–34.0)
MCHC: 34.4 g/dL (ref 30.0–36.0)
MCV: 93 fL (ref 80.0–100.0)
Monocytes Absolute: 1.9 10*3/uL — ABNORMAL HIGH (ref 0.1–1.0)
Monocytes Relative: 16 %
Neutro Abs: 7.9 10*3/uL — ABNORMAL HIGH (ref 1.7–7.7)
Neutrophils Relative %: 65 %
Platelets: 238 10*3/uL (ref 150–400)
RBC: 4.44 MIL/uL (ref 4.22–5.81)
RDW: 13.1 % (ref 11.5–15.5)
WBC: 12 10*3/uL — ABNORMAL HIGH (ref 4.0–10.5)
nRBC: 0 % (ref 0.0–0.2)

## 2020-12-02 MED ORDER — HYDROCODONE-ACETAMINOPHEN 5-325 MG PO TABS
1.0000 | ORAL_TABLET | Freq: Once | ORAL | Status: AC
  Administered 2020-12-02: 1 via ORAL
  Filled 2020-12-02: qty 1

## 2020-12-02 MED ORDER — MORPHINE SULFATE (PF) 4 MG/ML IV SOLN
4.0000 mg | INTRAVENOUS | Status: DC | PRN
  Filled 2020-12-02: qty 1

## 2020-12-02 MED ORDER — LACTATED RINGERS IV BOLUS
500.0000 mL | Freq: Once | INTRAVENOUS | Status: AC
  Administered 2020-12-02: 500 mL via INTRAVENOUS

## 2020-12-02 MED ORDER — IOHEXOL 350 MG/ML SOLN
100.0000 mL | Freq: Once | INTRAVENOUS | Status: AC | PRN
  Administered 2020-12-02: 100 mL via INTRAVENOUS

## 2020-12-02 NOTE — Telephone Encounter (Signed)
   [facility] Pre-operative Risk Assessment    Patient Name: OAK DOREY  DOB: 24-Sep-1945 MRN: [id number]   Request for surgical clearance:  What type of surgery is being performed? Dupuytrens release / right CTR  When is this surgery scheduled? 12/07/2020  What type of clearance is required (medical clearance vs. Pharmacy clearance to hold med vs. Both)? Medical - Pharmacy   Are there any medications that need to be held prior to surgery and how long? None listed but pt on Aspirin and Eliquis  Practice name and name of physician performing surgery? [facility] - Dr Orene Desanctis  What is the office phone number? [phone]   7.   What is the office fax number? [place]  8.   Anesthesia type (None, local, MAC, general) ? Regional block with MAC   Davit Vassar 12/02/2020, 1:13 PM  _________________________________________________________________   (provider comments below)

## 2020-12-02 NOTE — ED Triage Notes (Signed)
Pt presents with Right groin pain x2 days. He has an appointment tomorrow morning for the same. Two different friends that are both in the medical field advised pt to come to [facility] for further evaluation

## 2020-12-02 NOTE — Telephone Encounter (Signed)
Left message for surgery scheduler Adline Potter to please confirm if pt needs to hold ASA and Eliquis, see notes that it was not stated on clearance form from requesting office to hold these medications. Cardiologists will need to confirm if meds need to be held before proceeding with clearance. I will fax over an [place] note as well to please call back ASAP AS surgery is 12/07/20.

## 2020-12-02 NOTE — Telephone Encounter (Signed)
Patient with diagnosis of A Fib on Eliquis for anticoagulation.    Procedure: Dupuytrens release / right CTR Date of procedure: 12/07/20   CHA2DS2-VASc Score = 7  This indicates a 11.2% annual risk of stroke. The patient's score is based upon: CHF History: 1 HTN History: 1 Diabetes History: 0 Stroke History: 2 Vascular Disease History: 1 Age Score: 2 Gender Score: 0   CrCl 75 mL/min Platelet count 218K   Per office protocol, patient can hold Eliquis for 2 days prior to procedure.

## 2020-12-02 NOTE — ED Provider Notes (Signed)
[facility]ERGENC[facility] DEPT Provider Note   CSN: [id number] Arrival date & time: 12/02/20  1758     History Chief Complaint  Patient presents with   Groin Pain    Carl [name] is a 75 y.o. male.   Groin Pain Pertinent negatives include no chest pain, no abdominal pain, no headaches and no shortness of breath. Patient presents for pain and swelling in the area of his right groin.  Yesterday afternoon, he took walked off of a large step.  He planted his right leg and felt a pain in the area of his right groin.  This pain has been persistent since yesterday and is worse with weightbearing on his right leg.  Additionally, since that episode, he has developed swelling and bruising in this area.  He does take Eliquis for history of CAD.  His last dose was this morning.  Additionally, he takes a baby aspirin every day, the last of which was last night.  He did schedule a PCP appointment for tomorrow.  Following discussions with some of his friends who are medical doctors, they did encourage him to come to the emergency department for evaluation.     Past Medical History:  Diagnosis Date   Anxiety    takes Xanax daily as needed   Arthritis    CAD (coronary artery disease)    s/p CABG   Cataract    removed both eyes   Chronic diastolic CHF (congestive heart failure) ([place])    Depression    takes Celexa daily   Diverticulitis    Diverticulosis    Enlarged prostate    sightly   Esophagitis    Gastric ulcer    GERD (gastroesophageal reflux disease)    takes Protonix daily   Hx of adenomatous colonic polyps    benign   Hyperlipidemia    takes Zocor daily   Hypothyroidism    Insomnia    takes Ambien nightly   Joint pain    Joint swelling    OSA (obstructive sleep apnea)    Paroxysmal atrial fibrillation ([place])    On Eliquis    Patient Active Problem List   Diagnosis Date Noted   Acute on chronic diastolic CHF (congestive heart failure) ([place]) 10/27/2020   CAD  (coronary artery disease) 10/27/2020   Hypothyroidism 10/27/2020   Paroxysmal atrial fibrillation ([place]) 10/27/2020   Benign hypertension 07/22/2020   Drug-induced hypothyroidism 07/22/2020   Glaucoma 07/22/2020   Impaired fasting glucose 07/22/2020   Insomnia 07/22/2020   Macrocytosis 07/22/2020   Microscopic hematuria 07/22/2020   OSA (obstructive sleep apnea) 07/22/2020   Personal history of transient ischemic attack (TIA), and cerebral infarction without residual deficits 07/22/2020   Presence of aortocoronary bypass graft 07/22/2020   Transient ischemic attack 07/22/2020   Unspecified abnormal finding in specimens from other organs, systems and tissues 07/22/2020   Status post total replacement of right hip 04/17/2020   Avascular necrosis of bone of right hip ([place]) 04/07/2020   Pain in right hip 02/11/2020   Low back pain 02/11/2020   Typical atrial flutter ([facility])    Impacted cerumen of both ears 07/16/2018   TSH elevation 11/21/2017   Atrial fibrillation with rapid ventricular response ([facility])    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/16/2017   Atherosclerotic heart disease of native coronary artery without angina pectoris 11/15/2017   Abnormal cardiac CT angiography    RLQ abdominal pain 07/07/2015   Primary osteoarthritis of left knee 06/17/2014   S/P total knee replacement  using cement 06/17/2014   Preoperative cardiovascular examination 04/19/2014   Mixed hyperlipidemia 04/19/2014   AP (abdominal pain) 01/16/2014   History of diverticulitis 01/16/2014   GERD 01/14/2009   ULCER-GASTRIC 01/14/2009   GASTRITIS 11/14/2008   DIVERTICULITIS OF COLON 11/13/2008   BLOOD IN STOOL 11/13/2008   ABDOMINAL PAIN 11/13/2008   PERSONAL HISTORY OF COLONIC POLYPS 11/13/2008    Past Surgical History:  Procedure Laterality Date   A-FLUTTER ABLATION N/A 01/11/2019   Procedure: A-FLUTTER ABLATION;  Surgeon: Evans Lance, MD;  Location: [facility];  Service: Cardiovascular;  Laterality: N/A;    CARDIAC CATHETERIZATION     CARDIOVERSION N/A 11/20/2018   Procedure: CARDIOVERSION;  Surgeon: Troy Sine, MD;  Location: [facility];  Service: Cardiovascular;  Laterality: N/A;   cartliage removed from nose  18yrs ago   cataract surgery Bilateral    CERVICAL SPINE SURGERY     COLONOSCOPY     CORONARY ARTERY BYPASS GRAFT N/A 11/16/2017   Procedure: CORONARY ARTERY BYPASS GRAFTING (CABG) x4. LEFT ENDOSCOPIC SAPHENOUS VEIN HARVEST AND MAMMARY ARTERY TAKE DOWN. LIMA TO LAD, SVG TO PDA, SVG TO DISTAL CIRC & OMI.;  Surgeon: [name], MD;  Location: [facility];  Service: Open Heart Surgery;  Laterality: N/A;   EYE SURGERY     FOREIGN BODY REMOVAL ESOPHAGEAL     KNEE SURGERY Left    couple of times   LEFT HEART CATH AND CORONARY ANGIOGRAPHY N/A 11/15/2017   Procedure: LEFT HEART CATH AND CORONARY ANGIOGRAPHY;  Surgeon: Troy Sine, MD;  Location: [facility];  Service: Cardiovascular;  Laterality: N/A;   NASAL SINUS SURGERY     POLYPECTOMY     TEE WITHOUT CARDIOVERSION N/A 11/16/2017   Procedure: TRANSESOPHAGEAL ECHOCARDIOGRAM (TEE);  Surgeon: [name], MD;  Location: [facility];  Service: Open Heart Surgery;  Laterality: N/A;   TONSILLECTOMY     TOTAL HIP ARTHROPLASTY Right 04/17/2020   Procedure: RIGHT TOTAL HIP ARTHROPLASTY ANTERIOR APPROACH;  Surgeon: Mcarthur Rossetti, MD;  Location: [facility];  Service: Orthopedics;  Laterality: Right;   TOTAL KNEE ARTHROPLASTY Left 06/17/2014   Procedure: TOTAL KNEE ARTHROPLASTY;  Surgeon: Garald Balding, MD;  Location: [facility];  Service: Orthopedics;  Laterality: Left;       Family History  Problem Relation Age of Onset   Colon cancer Paternal Grandfather        questionable   Colon polyps Neg Hx    Esophageal cancer Neg Hx    Rectal cancer Neg Hx    Stomach cancer Neg Hx     Social History   Tobacco Use   Smoking status: Never   Smokeless tobacco: Never  Vaping Use   Vaping Use: Never used  Substance Use  Topics   Alcohol use: Yes    Alcohol/week: 10.0 standard drinks    Types: 10 Standard drinks or equivalent per week    Comment: 2 drinks 5 nights per week   Drug use: No    Home Medications Prior to Admission medications   Medication Sig Start Date End Date Taking? Authorizing Provider  oxyCODONE (ROXICODONE) 5 MG immediate release tablet Take 0.5-1 tablets (2.5-5 mg total) by mouth every 6 (six) hours as needed for up to 5 days for severe pain. 12/03/20 12/08/20 Yes Cardama, Grayce Sessions, MD  acetaminophen (TYLENOL) 500 MG tablet Take 1,000 mg by mouth every 6 (six) hours as needed for moderate pain or headache.    [provider]  aspirin EC 81 MG tablet Take 1 tablet (81 mg total) by mouth daily. 11/13/17   Troy Sine, MD  citalopram (CELEXA) 20 MG tablet Take 20 mg by mouth daily.    [provider]  ELIQUIS 5 MG TABS tablet TAKE 1 TABLET(5 MG) BY MOUTH TWICE DAILY 11/16/20   Troy Sine, MD  ferrous sulfate 325 (65 FE) MG tablet Take 325 mg by mouth at bedtime.    [provider]  furosemide (LASIX) 40 MG tablet Take 1 tablet (40 mg total) by mouth 2 (two) times daily. 11/06/20 11/06/21  Lendon Colonel, NP  icosapent Ethyl (VASCEPA) 1 g capsule TAKE 2 CAPSULES(2 GRAMS) BY MOUTH TWICE DAILY Patient taking differently: Take 2 g by mouth 2 (two) times daily. 05/06/20   Troy Sine, MD  levothyroxine (SYNTHROID) 150 MCG tablet TAKE 1 TABLET(150 MCG) BY MOUTH DAILY Patient taking differently: Take 150 mcg by mouth daily before breakfast. 06/29/20   Troy Sine, MD  lisinopril (ZESTRIL) 10 MG tablet Take 1 tablet (10 mg total) by mouth daily. 10/29/20 10/29/21  Swayze, Ava, DO  metoprolol tartrate (LOPRESSOR) 50 MG tablet Take 0.5 tablets (25 mg total) by mouth 2 (two) times daily. 10/29/20   Swayze, Ava, DO  Multiple Vitamins-Minerals (PRESERVISION AREDS 2 PO) Take 1 tablet by mouth 2 (two) times daily.    [provider]  nitroGLYCERIN  (NITROSTAT) 0.4 MG SL tablet DISSOLVE 1 TABLET UNDER THE TONGUE EVERY 5 MINUTES AS NEEDED FOR CHEST PAIN Patient taking differently: Place 0.4 mg under the tongue every 5 (five) minutes as needed for chest pain. 10/26/20   Troy Sine, MD  pantoprazole (PROTONIX) 20 MG tablet Take 2 tablets (40 mg total) by mouth daily. 11/16/20   Irene Shipper, MD  potassium chloride (KLOR-CON) 20 MEQ packet Take 20 mEq by mouth daily. 10/30/20   Swayze, Ava, DO  rosuvastatin (CRESTOR) 40 MG tablet Take 1 tablet (40 mg total) by mouth daily. 08/24/20   Troy Sine, MD  sildenafil (VIAGRA) 100 MG tablet Take 100 mg by mouth daily as needed for erectile dysfunction. 06/25/20   [provider]    Allergies    Patient has no known allergies.  Review of Systems   Review of Systems  Constitutional:  Negative for activity change, chills, fatigue and fever.  HENT:  Negative for congestion, ear pain and sore throat.   Eyes:  Negative for pain and visual disturbance.  Respiratory:  Negative for cough and shortness of breath.   Cardiovascular:  Negative for chest pain and palpitations.  Gastrointestinal:  Negative for abdominal pain, nausea and vomiting.  Genitourinary:  Negative for dysuria, flank pain and hematuria.  Musculoskeletal:  Positive for gait problem and myalgias. Negative for arthralgias, back pain, neck pain and neck stiffness.  Skin:  Positive for color change. Negative for rash.  Neurological:  Negative for dizziness, seizures, syncope, weakness, light-headedness, numbness and headaches.  Hematological:  Bruises/bleeds easily.  All other systems reviewed and are negative.  Physical Exam Updated Vital Signs BP (!) 166/78   Pulse 72   Temp 98.2 F (36.8 C) (Oral)   Resp 18   Ht 5\' 7"  (1.702 m)   Wt 91.6 kg   SpO2 98%   BMI 31.64 kg/m   Physical Exam Vitals and nursing note reviewed.  Constitutional:      General: He is not in acute distress.    Appearance: Normal  appearance. He is  well-developed and normal weight. He is not ill-appearing, toxic-appearing or diaphoretic.  HENT:     Head: Normocephalic and atraumatic.     Right Ear: External ear normal.     Left Ear: External ear normal.     Nose: Nose normal.  Eyes:     Extraocular Movements: Extraocular movements intact.     Conjunctiva/sclera: Conjunctivae normal.  Cardiovascular:     Rate and Rhythm: Normal rate and regular rhythm.     Heart sounds: No murmur heard. Pulmonary:     Effort: Pulmonary effort is normal. No respiratory distress.     Breath sounds: Normal breath sounds.  Abdominal:     Palpations: Abdomen is soft.     Tenderness: There is no abdominal tenderness.  Genitourinary:    Penis: Normal.      Testes: Normal.  Musculoskeletal:        General: Swelling, tenderness and signs of injury present.     Cervical back: Normal range of motion and neck supple.  Skin:    General: Skin is warm and dry.     Findings: Bruising present.  Neurological:     General: No focal deficit present.     Mental Status: He is alert and oriented to person, place, and time.     Cranial Nerves: No cranial nerve deficit.     Sensory: No sensory deficit.     Motor: No weakness.  Psychiatric:        Mood and Affect: Mood normal.        Behavior: Behavior normal.        Thought Content: Thought content normal.        Judgment: Judgment normal.    ED Results / Procedures / Treatments   Labs (all labs ordered are listed, but only abnormal results are displayed) Labs Reviewed  CBC WITH DIFFERENTIAL/PLATELET - Abnormal; Notable for the following components:      Result Value   WBC 12.0 (*)    Neutro Abs 7.9 (*)    Monocytes Absolute 1.9 (*)    All other components within normal limits  COMPREHENSIVE METABOLIC PANEL - Abnormal; Notable for the following components:   AST 57 (*)    ALT 51 (*)    All other components within normal limits  PROTIME-INR    EKG None  Radiology CT Angio  Aortobifemoral W and/or Wo Contrast  Result Date: 12/02/2020 CLINICAL DATA:  Right groin pain EXAM: CT ANGIOGRAPHY OF ABDOMINAL AORTA WITH ILIOFEMORAL RUNOFF TECHNIQUE: Multidetector CT imaging of the abdomen, pelvis and lower extremities was performed using the standard protocol during bolus administration of intravenous contrast. Multiplanar CT image reconstructions and MIPs were obtained to evaluate the vascular anatomy. CONTRAST:  152mL OMNIPAQUE IOHEXOL 350 MG/ML SOLN COMPARISON:  Radiograph 11/26/2020, CT 07/04/2017, 11/13/2018 FINDINGS: VASCULAR Aorta: Aorta is nonaneurysmal. No dissection seen. Mild atherosclerosis. No acute occlusive disease or significant stenosis Celiac: Slightly diminutive in caliber but patent. Direct origin of left gastric artery from the aorta. SMA: Patent without evidence of aneurysm, dissection, vasculitis or significant stenosis. Common hepatic artery arises from the SMA. Renals: Dominant superior renal arteries are patent. There are small lower pole accessory renal arteries bilaterally, 2 on the left and 1 on the right. Renal arteries are patent. No aneurysm or occlusive disease. IMA: Patent without evidence of aneurysm, dissection, vasculitis or significant stenosis. RIGHT Lower Extremity Inflow: Common, internal and external iliac arteries are patent without evidence of aneurysm, dissection, vasculitis or significant stenosis. Slightly limited evaluation of  right common femoral artery secondary to artifact from right hip hardware. Outflow: Common, superficial and profunda femoral arteries and the popliteal artery are patent without evidence of aneurysm, dissection, vasculitis or significant stenosis. Runoff: Diseased three-vessel runoff to the ankle. LEFT Lower Extremity Inflow: Common, internal and external iliac arteries are patent without evidence of aneurysm, dissection, vasculitis or significant stenosis. Outflow: Common, superficial and profunda femoral arteries and the  popliteal artery are patent without evidence of aneurysm, dissection, vasculitis or significant stenosis. Assessment of left popliteal artery is limited secondary to artifact from knee replacement. Runoff: Disease three-vessel runoff to the ankle. Veins: No obvious venous abnormality within the limitations of this arterial phase study. Review of the MIP images confirms the above findings. NON-VASCULAR Lower chest: Lung bases demonstrate no acute consolidation or effusion. There is mild cardiomegaly. Hepatobiliary: No focal liver abnormality is seen. No gallstones, gallbladder wall thickening, or biliary dilatation. Pancreas: Unremarkable. No pancreatic ductal dilatation or surrounding inflammatory changes. Spleen: Normal in size without focal abnormality. Adrenals/Urinary Tract: Adrenal glands are unremarkable. Kidneys are normal, without renal calculi, focal lesion, or hydronephrosis. Bladder is unremarkable. Stomach/Bowel: Stomach is within normal limits. Appendix appears normal. No evidence of bowel wall thickening, distention, or inflammatory changes. Diverticular disease of left colon without acute wall thickening. Lymphatic: No suspicious lymph nodes. Reproductive: Prostate is unremarkable. Other: Negative for pelvic effusion or free air. Fat containing left inguinal hernia Musculoskeletal: Right hip replacement with artifact. Asymmetric enlargement and edematous appearing right adductor muscles. Edema within the right rectus femoris as well. Moderate subcutaneous edema within the medial and anterior upper thigh. No rim enhancing fluid collection to suggest abscess. IMPRESSION: VASCULAR 1. Slightly limited assessment of the right common femoral and left popliteal vessels secondary to artifact from right hip replacement and left knee replacement. 2. No acute occlusive disease of the aortoiliac vessels. Grossly patent bilateral lower extremity vessels. Disease three-vessel runoff to the ankle. NON-VASCULAR 1.  Considerable asymmetric enlargement of the right adductor muscles and right rectus femoris muscles with ill-defined edematous appearance. Considerable subcutaneous edema within the anteromedial proximal right thigh. In the absence of any history of trauma to the region, favor that findings are secondary to musculoskeletal infection/cellulitis/myositis. No gross soft tissue abscess identified. Consider correlation with MRI. 2. No CT evidence for acute intra-abdominal or pelvic abnormality. 3. Diverticular disease of the colon without acute inflammatory process Electronically Signed   By: Donavan Foil M.D.   On: 12/02/2020 23:45    Procedures Procedures   Medications Ordered in ED Medications  morphine 4 MG/ML injection 4 mg (has no administration in time range)  lactated ringers bolus 500 mL (0 mLs Intravenous Stopped 12/02/20 2302)  HYDROcodone-acetaminophen (NORCO/VICODIN) 5-325 MG per tablet 1 tablet (1 tablet Oral Given 12/02/20 2150)  iohexol (OMNIPAQUE) 350 MG/ML injection 100 mL (100 mLs Intravenous Contrast Given 12/02/20 2243)    ED Course  I have reviewed the triage vital signs and the nursing notes.  Pertinent labs & imaging results that were available during my care of the patient were reviewed by me and considered in my medical decision making (see chart for details).    MDM Rules/Calculators/A&P                          Patient presents for pain, swelling, and bruising to the area of his proximal, medial right thigh.  Onset of pain was yesterday when he took a large step and placed his weight onto his right  leg.  Swelling and bruising followed this pain.  Pain has been persistent.  He is on Eliquis.  On arrival in the ED, vital signs are notable for hypertension.  He is overall well-appearing.  He does have significant swelling and bruising to area of concern.  No palpable thrill is appreciated.  Patient was given Norco for analgesia.  Labs and CT scan were ordered.  Lab work  showed mild leukocytosis with baseline hemoglobin.  At time of signout, results of CT scan were pending.  Care of patient was signed out to oncoming ED provider.  Final Clinical Impression(s) / ED Diagnoses Final diagnoses:  Hematoma  HX: anticoagulation    Rx / DC Orders ED Discharge Orders          Ordered    oxyCODONE (ROXICODONE) 5 MG immediate release tablet  Every 6 hours PRN        12/03/20 0127             Godfrey Pick, MD 12/03/20 [phone]

## 2020-12-02 NOTE — Telephone Encounter (Addendum)
   Name: Carl [name]  DOB: 02-Apr-1945  MRN: [id number]   Primary Cardiologist: Shelva Majestic, MD  Chart reviewed as part of pre-operative protocol coverage. Patient was contacted 12/02/2020 in reference to pre-operative risk assessment for pending surgery as outlined below.  Carl [name] was last seen on 10/2020, complex history reviewed. Hx CABG, PAF, diastolic CHF amongst other history. Last ischemic assessment by nuc 2020 was low risk. Echo 10/2020 showed normal EF. I reached out to patient for update on how he is doing. The patient affirms he has been doing well without any new cardiac symptoms. Therefore, based on ACC/AHA guidelines, the patient would be at acceptable risk for the planned procedure without further cardiovascular testing. The patient was advised that if he develops new symptoms prior to surgery to contact our office to arrange for a follow-up visit, and he verbalized understanding.  Patient states he called surgeon's office to ask about blood thinners and states the person on the line told him maybe to hold them a few days. It was not listed on our clearance that he needed to hold anything. He already started holding ASA yesterday. I will route to callback to clarify whether the patient actually has to hold his blood thinners for this procedure since it was not listed on clearance. I reviewed with [name] via secure chat who did feel OK to hold ASA if it is in fact needed - will also route to pharm.   Charlie Pitter, PA-C 12/02/2020, 1:33 PM

## 2020-12-02 NOTE — Telephone Encounter (Signed)
I called patient back to give update that we have not yet received word from surgeon's office about whether ASA or Eliquis need to be held for procedure.  Tentatively if it's confirmed Eliquis needs to be held, pharmacy has recommended to hold 2 days before procedure (10/29, 10/30). [name] did give permission to hold ASA if needed so the patient will continue to hold as he is doing, pending further instructions after our team hears back from the surgeon.  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 Janace Hoard, informed him that he may have a new hernia. He is going to seek care for this tonight which may impact above recommendations. Julaine Hua is covering preop callback team tomorrow and I've asked her to direct preop APP's attention to this chart so that they're in the loop.

## 2020-12-03 DIAGNOSIS — S8011XD Contusion of right lower leg, subsequent encounter: Secondary | ICD-10-CM | POA: Diagnosis not present

## 2020-12-03 DIAGNOSIS — Z6833 Body mass index (BMI) 33.0-33.9, adult: Secondary | ICD-10-CM | POA: Diagnosis not present

## 2020-12-03 MED ORDER — OXYCODONE HCL 5 MG PO TABS: 2.5000 mg | ORAL_TABLET | Freq: Four times a day (QID) | ORAL | 0 refills | Status: AC | PRN

## 2020-12-03 NOTE — Telephone Encounter (Signed)
    Patient Name: Carl [name]  DOB: 18-Jun-1945 MRN: [id number]  Primary Cardiologist: Shelva Majestic, MD  Chart reviewed as part of pre-operative protocol coverage. As documented below by Melina Copa, PA-C, based on ACC/AHA guidelines, [name] would be at acceptable risk for the planned procedure without further cardiovascular testing.   The patient was advised that if he develops new symptoms prior to surgery to contact our office to arrange for a follow-up visit, and he verbalized understanding.  Per pharmacy recommendations, patient can hold eliquis 2 days prior to his upcoming hand surgery with plans to restart when cleared to do so by his surgeon. In addition, if needed, patient can hold aspirin 5-7 days prior to his upcoming procedure which should be restarted as soon as bleeding risk if acceptable.   ED visit 12/02/20 reviewed to evaluate right groin swelling. Work-up c/w hematoma and he was recommended to undergo an eliquis holiday which is in line with above recommendations for upcoming surgery. This does not impact plans to above surgery.   I will route this recommendation to the requesting party via Epic fax function and remove from pre-op pool.  Please call with questions.  Abigail Butts, PA-C 12/03/2020, 8:32 AM

## 2020-12-03 NOTE — ED Provider Notes (Signed)
I assumed care of this patient.  Please see previous provider note for further details of Hx, PE.  Briefly patient is a 75 y.o. male who presented here for right groin pain noted to have large ecchymotic region currently pending CT angio.  Patient is anticoagulated on Eliquis.  CT notable for edema around the right abductor musculature in the right rectus femoris muscles.  Given the exam this is consistent with likely hematoma.  Patient has intact pulses.  Stable hemoglobin.  Recommended Eliquis vacation, which he was already going to be on due to an upcoming surgery.  Patient has follow-up with PCPs office in the morning.  Felt comfortable being discharged home.  He was given strict return precautions.       Fatima Blank, MD 12/03/20 [phone]

## 2020-12-03 NOTE — Discharge Instructions (Addendum)
For pain control you may take at 1000 mg of Tylenol every 8 hours scheduled.  In addition you can take 0.5 to 1 tablet of Oxycodone as needed every 6 hours for pain not controlled with the scheduled Tylenol.

## 2020-12-03 NOTE — Telephone Encounter (Signed)
Carl [name] with Dr. Greta Doom office called back and confirmed no need to hold ASA, just hold Eliquis. I did tell Carl [name] that [name] did give the ok to hold ASA 5-7 days if needed. Carl [name] confirmed that she did receive the clearance notes from today as well. Carl [name] thanked me for the help.

## 2020-12-07 ENCOUNTER — Other Ambulatory Visit: Payer: Self-pay | Admitting: Orthopedic Surgery

## 2020-12-07 DIAGNOSIS — M720 Palmar fascial fibromatosis [Dupuytren]: Secondary | ICD-10-CM | POA: Diagnosis not present

## 2020-12-07 DIAGNOSIS — G5601 Carpal tunnel syndrome, right upper limb: Secondary | ICD-10-CM | POA: Diagnosis not present

## 2020-12-09 DIAGNOSIS — M25641 Stiffness of right hand, not elsewhere classified: Secondary | ICD-10-CM | POA: Diagnosis not present

## 2020-12-16 DIAGNOSIS — M25641 Stiffness of right hand, not elsewhere classified: Secondary | ICD-10-CM | POA: Diagnosis not present

## 2020-12-21 DIAGNOSIS — M25641 Stiffness of right hand, not elsewhere classified: Secondary | ICD-10-CM | POA: Diagnosis not present

## 2020-12-28 DIAGNOSIS — M25641 Stiffness of right hand, not elsewhere classified: Secondary | ICD-10-CM | POA: Diagnosis not present

## 2021-01-04 DIAGNOSIS — M25641 Stiffness of right hand, not elsewhere classified: Secondary | ICD-10-CM | POA: Diagnosis not present

## 2021-01-06 ENCOUNTER — Other Ambulatory Visit: Payer: Self-pay

## 2021-01-06 ENCOUNTER — Encounter (INDEPENDENT_AMBULATORY_CARE_PROVIDER_SITE_OTHER): Payer: PPO | Admitting: Ophthalmology

## 2021-01-06 DIAGNOSIS — H35033 Hypertensive retinopathy, bilateral: Secondary | ICD-10-CM | POA: Diagnosis not present

## 2021-01-06 DIAGNOSIS — H353132 Nonexudative age-related macular degeneration, bilateral, intermediate dry stage: Secondary | ICD-10-CM | POA: Diagnosis not present

## 2021-01-06 DIAGNOSIS — I10 Essential (primary) hypertension: Secondary | ICD-10-CM | POA: Diagnosis not present

## 2021-01-06 DIAGNOSIS — H43813 Vitreous degeneration, bilateral: Secondary | ICD-10-CM | POA: Diagnosis not present

## 2021-01-14 ENCOUNTER — Ambulatory Visit: Payer: PPO | Admitting: Orthopaedic Surgery

## 2021-01-14 DIAGNOSIS — H6121 Impacted cerumen, right ear: Secondary | ICD-10-CM | POA: Diagnosis not present

## 2021-01-14 DIAGNOSIS — H6122 Impacted cerumen, left ear: Secondary | ICD-10-CM | POA: Diagnosis not present

## 2021-01-14 DIAGNOSIS — H6123 Impacted cerumen, bilateral: Secondary | ICD-10-CM | POA: Diagnosis not present

## 2021-03-08 NOTE — Progress Notes (Addendum)
error 

## 2021-03-09 ENCOUNTER — Ambulatory Visit (INDEPENDENT_AMBULATORY_CARE_PROVIDER_SITE_OTHER): Payer: PPO | Admitting: Physician Assistant

## 2021-03-09 ENCOUNTER — Encounter: Payer: Self-pay | Admitting: Physician Assistant

## 2021-03-09 ENCOUNTER — Other Ambulatory Visit: Payer: Self-pay

## 2021-03-09 VITALS — BP 138/70 | HR 65 | Ht 68.0 in | Wt 213.0 lb

## 2021-03-09 DIAGNOSIS — Z8679 Personal history of other diseases of the circulatory system: Secondary | ICD-10-CM

## 2021-03-09 DIAGNOSIS — Z951 Presence of aortocoronary bypass graft: Secondary | ICD-10-CM | POA: Diagnosis not present

## 2021-03-09 DIAGNOSIS — Z9889 Other specified postprocedural states: Secondary | ICD-10-CM

## 2021-03-09 DIAGNOSIS — E785 Hyperlipidemia, unspecified: Secondary | ICD-10-CM

## 2021-03-09 DIAGNOSIS — I5032 Chronic diastolic (congestive) heart failure: Secondary | ICD-10-CM | POA: Diagnosis not present

## 2021-03-09 DIAGNOSIS — E7800 Pure hypercholesterolemia, unspecified: Secondary | ICD-10-CM

## 2021-03-09 MED ORDER — EZETIMIBE 10 MG PO TABS: 10.0000 mg | ORAL_TABLET | Freq: Every day | ORAL | 3 refills | Status: DC

## 2021-03-09 NOTE — Progress Notes (Addendum)
Cardiology Office Note:    Date:  03/09/2021   ID:  SEITH AIKEY, DOB [birth year], MRN [id number]  PCP:  Hulan Fess, MD   [facility] Providers Cardiologist:  Shelva Majestic, MD Cardiology APP:  Ledora Bottcher, [place] {  Referring MD: Hulan Fess, MD   Chief Complaint  Patient presents with   Follow-up    CHF, CAD    History of Present Illness:    Carl [name] is a 76 y.o. male with a hx of typical atrial flutter, paroxysmal atrial fibrillation, chronic anticoagulation with Eliquis, CAD s/p CABG x4 (LIMA-LAD, SVG-OM, SVG-diagonal circumflex, SVG-PDA) 7673, chronic diastolic heart failure, hypertension, hyperlipidemia, hypothyroidism, and OSA.  He was taken off of amiodarone due to elevated LFTs and elevated TSH levels.  He underwent DCCV 11/2018 but continues to have recurrent episodes of atrial flutter.  Dr. Lovena Le performed successful atrial flutter ablation December 2020.  OSA followed by [name].  He was hospitalized in September 2022 with acute on chronic diastolic heart failure.  He was diuresed and discharged on daily Lasix.  He presents today for routine follow up. He continues to have dyspnea and feels like he has fluid. He is taking 40 mg lasix BID and wants to take an extra. He has not been taking supplemental potassium. He states he has noticed about a 13 lb weight gain but this has been over the past several months. Discharge weight in Sept 2022 92.3 kg. He does walk 1-2 miles and works with a trainer 3 days per week. He does have bendopnea. He does not report orthopnea. He does not appear extremely volume up on exam. He questions taking extra lasix since he felt so good after discharge from IV lasix. He has not been taking potassium, but was originally prescribed 40 mg lasix BID and 20 mEq potassium once daily.  No other cardiac complaints.     Past Medical History:  Diagnosis Date   Anxiety    takes Xanax daily as needed   Arthritis    CAD (coronary artery  disease)    s/p CABG   Cataract    removed both eyes   Chronic diastolic CHF (congestive heart failure) ([place])    Depression    takes Celexa daily   Diverticulitis    Diverticulosis    Enlarged prostate    sightly   Esophagitis    Gastric ulcer    GERD (gastroesophageal reflux disease)    takes Protonix daily   Hx of adenomatous colonic polyps    benign   Hyperlipidemia    takes Zocor daily   Hypothyroidism    Insomnia    takes Ambien nightly   Joint pain    Joint swelling    OSA (obstructive sleep apnea)    Paroxysmal atrial fibrillation ([place])    On Eliquis    Past Surgical History:  Procedure Laterality Date   A-FLUTTER ABLATION N/A 01/11/2019   Procedure: A-FLUTTER ABLATION;  Surgeon: Evans Lance, MD;  Location: [place] [facility];  Service: Cardiovascular;  Laterality: N/A;   CARDIAC CATHETERIZATION     CARDIOVERSION N/A 11/20/2018   Procedure: CARDIOVERSION;  Surgeon: Troy Sine, MD;  Location: [facility] ENDOSCOPY;  Service: Cardiovascular;  Laterality: N/A;   cartliage removed from nose  68yrs ago   cataract surgery Bilateral    CERVICAL SPINE SURGERY     COLONOSCOPY     CORONARY ARTERY BYPASS GRAFT N/A 11/16/2017   Procedure: CORONARY ARTERY BYPASS GRAFTING (CABG) x4.  LEFT ENDOSCOPIC SAPHENOUS VEIN HARVEST AND MAMMARY ARTERY TAKE DOWN. LIMA TO LAD, SVG TO PDA, SVG TO DISTAL CIRC & OMI.;  Surgeon: [name], MD;  Location: [place];  Service: Open Heart Surgery;  Laterality: N/A;   EYE SURGERY     FOREIGN BODY REMOVAL ESOPHAGEAL     KNEE SURGERY Left    couple of times   LEFT HEART CATH AND CORONARY ANGIOGRAPHY N/A 11/15/2017   Procedure: LEFT HEART CATH AND CORONARY ANGIOGRAPHY;  Surgeon: Troy Sine, MD;  Location: [place] [facility];  Service: Cardiovascular;  Laterality: N/A;   NASAL SINUS SURGERY     POLYPECTOMY     TEE WITHOUT CARDIOVERSION N/A 11/16/2017   Procedure: TRANSESOPHAGEAL ECHOCARDIOGRAM (TEE);  Surgeon: [name], MD;   Location: [place];  Service: Open Heart Surgery;  Laterality: N/A;   TONSILLECTOMY     TOTAL HIP ARTHROPLASTY Right 04/17/2020   Procedure: RIGHT TOTAL HIP ARTHROPLASTY ANTERIOR APPROACH;  Surgeon: Mcarthur Rossetti, MD;  Location: [facility];  Service: Orthopedics;  Laterality: Right;   TOTAL KNEE ARTHROPLASTY Left 06/17/2014   Procedure: TOTAL KNEE ARTHROPLASTY;  Surgeon: Garald Balding, MD;  Location: [place];  Service: Orthopedics;  Laterality: Left;    Current Medications: Current Meds  Medication Sig   acetaminophen (TYLENOL) 500 MG tablet Take 1,000 mg by mouth every 6 (six) hours as needed for moderate pain or headache.   aspirin EC 81 MG tablet Take 1 tablet (81 mg total) by mouth daily.   citalopram (CELEXA) 20 MG tablet Take 20 mg by mouth daily.   ELIQUIS 5 MG TABS tablet TAKE 1 TABLET(5 MG) BY MOUTH TWICE DAILY   ezetimibe (ZETIA) 10 MG tablet Take 1 tablet (10 mg total) by mouth daily.   ferrous sulfate 325 (65 FE) MG tablet Take 325 mg by mouth at bedtime.   furosemide (LASIX) 40 MG tablet Take 1 tablet (40 mg total) by mouth 2 (two) times daily.   icosapent Ethyl (VASCEPA) 1 g capsule TAKE 2 CAPSULES(2 GRAMS) BY MOUTH TWICE DAILY (Patient taking differently: Take 2 g by mouth 2 (two) times daily.)   levothyroxine (SYNTHROID) 150 MCG tablet TAKE 1 TABLET(150 MCG) BY MOUTH DAILY (Patient taking differently: Take 150 mcg by mouth daily before breakfast.)   lisinopril (ZESTRIL) 10 MG tablet Take 1 tablet (10 mg total) by mouth daily.   metoprolol tartrate (LOPRESSOR) 50 MG tablet Take 0.5 tablets (25 mg total) by mouth 2 (two) times daily.   Multiple Vitamins-Minerals (PRESERVISION AREDS 2 PO) Take 1 tablet by mouth 2 (two) times daily.   nitroGLYCERIN (NITROSTAT) 0.4 MG SL tablet DISSOLVE 1 TABLET UNDER THE TONGUE EVERY 5 MINUTES AS NEEDED FOR CHEST PAIN (Patient taking differently: Place 0.4 mg under the tongue every 5 (five) minutes as needed for chest pain.)   pantoprazole  (PROTONIX) 20 MG tablet Take 2 tablets (40 mg total) by mouth daily.   potassium chloride (KLOR-CON) 20 MEQ packet Take 20 mEq by mouth daily.   rosuvastatin (CRESTOR) 40 MG tablet Take 1 tablet (40 mg total) by mouth daily.   sildenafil (VIAGRA) 100 MG tablet Take 100 mg by mouth daily as needed for erectile dysfunction.     Allergies:   Patient has no known allergies.   Social History   Socioeconomic History   Marital status: Single    Spouse name: Not on file   Number of children: 3   Years of education: Not on file  Highest education level: Some college, no degree  Occupational History   Occupation: Owner  Tobacco Use   Smoking status: Never   Smokeless tobacco: Never  Vaping Use   Vaping Use: Never used  Substance and Sexual Activity   Alcohol use: Yes    Alcohol/week: 10.0 standard drinks    Types: 10 Standard drinks or equivalent per week    Comment: 2 drinks 5 nights per week   Drug use: No   Sexual activity: Never  Other Topics Concern   Not on file  Social History Narrative   Not on file   Social Determinants of Health   Financial Resource Strain: Low Risk    Difficulty of Paying Living Expenses: Not hard at all  Food Insecurity: No Food Insecurity   Worried About Charity fundraiser in the Last Year: Never true   [place] in the Last Year: Never true  Transportation Needs: No Transportation Needs   Lack of Transportation (Medical): No   Lack of Transportation (Non-Medical): No  Physical Activity: Sufficiently Active   Days of Exercise per Week: 3 days   Minutes of Exercise per Session: 60 min  Stress: Not on file  Social Connections: Not on file     Family History: The patient's family history includes Colon cancer in his paternal grandfather. There is no history of Colon polyps, Esophageal cancer, Rectal cancer, or Stomach cancer.  ROS:   Please see the history of present illness.     All other systems reviewed and are  negative.  EKGs/Labs/Other Studies Reviewed:    The following studies were reviewed today:  Echocardiogram 10/29/2020  1. Left ventricular ejection fraction, by estimation, is 55 to 60%. The  left ventricle has normal function. Left ventricular endocardial border  not optimally defined to evaluate regional wall motion. There is moderate  left ventricular hypertrophy. Left  ventricular diastolic parameters are consistent with Grade II diastolic  dysfunction (pseudonormalization).   2. Right ventricular systolic function is mildly reduced. The right  ventricular size is normal. There is normal pulmonary artery systolic  pressure. The estimated right ventricular systolic pressure is 63.1 mmHg.   3. Left atrial size was moderately dilated.   4. The mitral valve is grossly normal. Trivial mitral valve  regurgitation.   5. The aortic valve is grossly normal. There is mild calcification of the  aortic valve. Aortic valve regurgitation is mild. No aortic stenosis is  present.   6. The inferior vena cava is normal in size with greater than 50%  respiratory variability, suggesting right atrial pressure of 3 mmHg.     Atrial Flutter Ablation 01/11/2019 1. Isthmus-dependent right atrial flutter upon presentation.  2. Successful radiofrequency ablation of atrial flutter along the cavotricuspid isthmus with complete bidirectional isthmus block achieved.  3. No inducible arrhythmias following ablation.  4. No early apparent complications.    Left Heart Cath 11/15/2017 Prox RCA to Mid RCA lesion is 60% stenosed. Mid RCA lesion is 50% stenosed. Prox Cx to Mid Cx lesion is 100% stenosed. Ost 2nd Mrg lesion is 80% stenosed. 2nd Mrg lesion is 90% stenosed. Ost 1st Mrg to 1st Mrg lesion is 50% stenosed. Dist LM to Ost LAD lesion is 85% stenosed. Prox LAD to Mid LAD lesion is 75% stenosed.   Severe multivessel CAD with 85% ostial LAD stenosis, diffuse 75% mid LAD stenosis; total occlusion of the mid  AV groove circumflex after the takeoff of the second obtuse marginal vessel with 50%  diffuse stenosis in the first large marginal branch, 80 and 90% stenoses in the second obtuse marginal and extensive collateralization to the distal circumflex and third marginal via the LAD circulation; 60 and 50% mid RCA stenoses in a dominant RCA.   LVEDP 12 mm.  Previous echo documentation of hyperdynamic LV function with an EF of 65 to 70%.   Suspect high right radial artery take-off with stenosis/spasm above the elbow and retrograde filling of brachial artery  EKG:  EKG is not ordered today.    Recent Labs: 08/03/2020: TSH 2.520 10/27/2020: B Natriuretic Peptide 667.7 10/28/2020: Magnesium 2.3 12/02/2020: ALT 51; BUN 17; Creatinine, Ser 0.96; Hemoglobin 14.2; Platelets 238; Potassium 3.9; Sodium 135  Recent Lipid Panel    Component Value Date/Time   CHOL 158 08/03/2020 1444   TRIG 172 (H) 08/03/2020 1444   HDL 46 08/03/2020 1444   CHOLHDL 3.4 08/03/2020 1444   CHOLHDL 3.4 01/29/2007 0410   VLDL 17 01/29/2007 0410   LDLCALC 83 08/03/2020 1444     Risk Assessment/Calculations:    CHA2DS2-VASc Score = 7   This indicates a 11.2% annual risk of stroke. The patient's score is based upon: CHF History: 1 HTN History: 1 Diabetes History: 0 Stroke History: 2 Vascular Disease History: 1 Age Score: 2 Gender Score: 0        Physical Exam:    VS:  BP 138/70    Pulse 65    Ht 5\' 8"  (1.727 m)    Wt 213 lb (96.6 kg)    SpO2 98%    BMI 32.39 kg/m     Wt Readings from Last 3 Encounters:  03/09/21 213 lb (96.6 kg)  12/02/20 202 lb (91.6 kg)  11/06/20 204 lb 3.2 oz (92.6 kg)     GEN:  Well nourished, well developed in no acute distress HEENT: Normal NECK: No JVD; No carotid bruits LYMPHATICS: No lymphadenopathy CARDIAC: RRR, no murmurs, rubs, gallops RESPIRATORY:  Clear to auscultation without rales, wheezing or rhonchi  ABDOMEN: Soft, non-tender, non-distended MUSCULOSKELETAL:  No edema; No  deformity  SKIN: Warm and dry NEUROLOGIC:  Alert and oriented x 3 PSYCHIATRIC:  Normal affect   ASSESSMENT:    1. Hypercholesterolemia   2. S/P CABG x 4   3. Hyperlipidemia LDL goal <70   4. S/P ablation of atrial flutter   5. Chronic diastolic heart failure (HCC)    PLAN:    In order of problems listed above:  CAD s/p CABG x 4 2019 Hyperlipidemia with LDL goal < 70 Reassuring nuclear stress test in 2020 Continue ASA, statin, vascepa 08/03/2020: Cholesterol, Total 158; HDL 46; LDL Chol Calc (NIH) 83; Triglycerides 172 Not at goal - will add zetia - repeat labs at next visit - may need [id number]   Chronic diastolic heart failure Taking 40 mg lasix BID at home - will increase this to 80 mg BID x 4 days. He is not taking potassium - initially prescribed 20 mEq once daily. Will need to call him and instruct how to take potassium pending BMP today.   PAF Atrial flutter s/p flutter ablation Chronic anticoagulation Intolerant to amiodarone due to elevated LFTs and TSH.  Continue BB, eliquis   Hypertension Continue 10 mg lisinopril   Follow up in 6 months.     Medication Adjustments/Labs and Tests Ordered: Current medicines are reviewed at length with the patient today.  Concerns regarding medicines are outlined above.  Orders Placed This Encounter  Procedures   Basic metabolic panel  Meds ordered this encounter  Medications   ezetimibe (ZETIA) 10 MG tablet    Sig: Take 1 tablet (10 mg total) by mouth daily.    Dispense:  90 tablet    Refill:  3    Patient Instructions  Medication Instructions:  Start Zetia 10 mg (1 Tablet Daily). Increase Lasix to 80 mg for 4 Days. Then resume lasix 40 mg Daily. *If you need a refill on your cardiac medications before your next appointment, please call your pharmacy*   Lab Work: BMET  If you have labs (blood work) drawn today and your tests are completely normal, you will receive your results only by: [place] (if you  have MyChart) OR A paper copy in the mail If you have any lab test that is abnormal or we need to change your treatment, we will call you to review the results.   Testing/Procedures: No Testing   Follow-Up: At [facility], you and your health needs are our priority.  As part of our continuing mission to provide you with exceptional heart care, we have created designated Provider Care Teams.  These Care Teams include your primary Cardiologist (physician) and Advanced Practice Providers (APPs -  Physician Assistants and Nurse Practitioners) who all work together to provide you with the care you need, when you need it.  We recommend signing up for the patient portal called "MyChart".  Sign up information is provided on this After Visit Summary.  MyChart is used to connect with patients for Virtual Visits (Telemedicine).  Patients are able to view lab/test results, encounter notes, upcoming appointments, etc.  Non-urgent messages can be sent to your provider as well.   To learn more about what you can do with MyChart, go to [email or web address].    Your next appointment:   6 month(s)  The format for your next appointment:   In Person  Provider:   Fabian Sharp, PA-C    Then, Shelva Majestic, MD will plan to see you again in 6 month(s).    Other Instructions Log weight daily.   Signed, Ledora Bottcher, [place]  03/09/2021 3:31 PM    [facility]

## 2021-03-09 NOTE — Patient Instructions (Signed)
Medication Instructions:  Start Zetia 10 mg (1 Tablet Daily). Increase Lasix to 80 mg for 4 Days. Then resume lasix 40 mg Daily. *If you need a refill on your cardiac medications before your next appointment, please call your pharmacy*   Lab Work: BMET  If you have labs (blood work) drawn today and your tests are completely normal, you will receive your results only by: [place] (if you have MyChart) OR A paper copy in the mail If you have any lab test that is abnormal or we need to change your treatment, we will call you to review the results.   Testing/Procedures: No Testing   Follow-Up: At [facility], you and your health needs are our priority.  As part of our continuing mission to provide you with exceptional heart care, we have created designated Provider Care Teams.  These Care Teams include your primary Cardiologist (physician) and Advanced Practice Providers (APPs -  Physician Assistants and Nurse Practitioners) who all work together to provide you with the care you need, when you need it.  We recommend signing up for the patient portal called "MyChart".  Sign up information is provided on this After Visit Summary.  MyChart is used to connect with patients for Virtual Visits (Telemedicine).  Patients are able to view lab/test results, encounter notes, upcoming appointments, etc.  Non-urgent messages can be sent to your provider as well.   To learn more about what you can do with MyChart, go to [email or web address].    Your next appointment:   6 month(s)  The format for your next appointment:   In Person  Provider:   Fabian Sharp, PA-C    Then, Shelva Majestic, MD will plan to see you again in 6 month(s).    Other Instructions Log weight daily.

## 2021-03-10 LAB — BASIC METABOLIC PANEL
BUN/Creatinine Ratio: 21 (ref 10–24)
BUN: 19 mg/dL (ref 8–27)
CO2: 22 mmol/L (ref 20–29)
Calcium: 9.4 mg/dL (ref 8.6–10.2)
Chloride: 100 mmol/L (ref 96–106)
Creatinine, Ser: 0.9 mg/dL (ref 0.76–1.27)
Glucose: 110 mg/dL — ABNORMAL HIGH (ref 70–99)
Potassium: 4.7 mmol/L (ref 3.5–5.2)
Sodium: 140 mmol/L (ref 134–144)
eGFR: 89 mL/min/{1.73_m2} (ref >59)

## 2021-04-20 ENCOUNTER — Other Ambulatory Visit: Payer: Self-pay

## 2021-04-20 DIAGNOSIS — Z79899 Other long term (current) drug therapy: Secondary | ICD-10-CM

## 2021-04-22 ENCOUNTER — Other Ambulatory Visit: Payer: Self-pay | Admitting: Cardiovascular Disease

## 2021-04-22 DIAGNOSIS — Z79899 Other long term (current) drug therapy: Secondary | ICD-10-CM | POA: Diagnosis not present

## 2021-04-23 LAB — BASIC METABOLIC PANEL
BUN/Creatinine Ratio: 17 (ref 10–24)
BUN: 24 mg/dL (ref 8–27)
CO2: 20 mmol/L (ref 20–29)
Calcium: 9.2 mg/dL (ref 8.6–10.2)
Chloride: 102 mmol/L (ref 96–106)
Creatinine, Ser: 1.41 mg/dL — ABNORMAL HIGH (ref 0.76–1.27)
Glucose: 109 mg/dL — ABNORMAL HIGH (ref 70–99)
Potassium: 4.4 mmol/L (ref 3.5–5.2)
Sodium: 141 mmol/L (ref 134–144)
eGFR: 52 mL/min/{1.73_m2} — ABNORMAL LOW (ref >59)

## 2021-04-26 ENCOUNTER — Other Ambulatory Visit: Payer: Self-pay | Admitting: Internal Medicine

## 2021-04-26 ENCOUNTER — Other Ambulatory Visit: Payer: Self-pay | Admitting: Cardiovascular Disease

## 2021-04-26 NOTE — Telephone Encounter (Signed)
Prescription refill request for Eliquis received. ?Indication:Afib ?Last office visit:1/23 ?Scr:1.4 ?Age: 76 ?Weight:96.6 kg ? ?Prescription refilled ? ?

## 2021-04-29 ENCOUNTER — Telehealth: Payer: Self-pay | Admitting: Physician Assistant

## 2021-04-29 DIAGNOSIS — E032 Hypothyroidism due to medicaments and other exogenous substances: Secondary | ICD-10-CM | POA: Diagnosis not present

## 2021-04-29 DIAGNOSIS — E782 Mixed hyperlipidemia: Secondary | ICD-10-CM | POA: Diagnosis not present

## 2021-04-29 DIAGNOSIS — I10 Essential (primary) hypertension: Secondary | ICD-10-CM | POA: Diagnosis not present

## 2021-04-29 DIAGNOSIS — I509 Heart failure, unspecified: Secondary | ICD-10-CM | POA: Diagnosis not present

## 2021-04-29 NOTE — Telephone Encounter (Signed)
Left a message for the pharmacist to call back. ? ?

## 2021-04-29 NOTE — Telephone Encounter (Signed)
Pt c/o medication issue: ? ?1. Name of Medication: furosemide (LASIX) 40 MG tablet ? ?2. How are you currently taking this medication (dosage and times per day)? 80 mg twice daily ? ?3. Are you having a reaction (difficulty breathing--STAT)?  ? ?4. What is your medication issue? [facility] Pharmacist Assistant Wendelyn Breslow called to report a discrepancy in how the patient is taking the medication. She wanted to make our office aware  ?

## 2021-05-03 ENCOUNTER — Other Ambulatory Visit: Payer: Self-pay | Admitting: *Deleted

## 2021-05-03 ENCOUNTER — Other Ambulatory Visit: Payer: Self-pay

## 2021-05-03 DIAGNOSIS — I5032 Chronic diastolic (congestive) heart failure: Secondary | ICD-10-CM

## 2021-05-03 NOTE — Progress Notes (Signed)
p 

## 2021-05-04 LAB — BASIC METABOLIC PANEL
BUN/Creatinine Ratio: 17 (ref 10–24)
BUN: 17 mg/dL (ref 8–27)
CO2: 21 mmol/L (ref 20–29)
Calcium: 9.8 mg/dL (ref 8.6–10.2)
Chloride: 99 mmol/L (ref 96–106)
Creatinine, Ser: 1.02 mg/dL (ref 0.76–1.27)
Glucose: 74 mg/dL (ref 70–99)
Potassium: 4.7 mmol/L (ref 3.5–5.2)
Sodium: 141 mmol/L (ref 134–144)
eGFR: 76 mL/min/{1.73_m2} (ref >59)

## 2021-05-05 DIAGNOSIS — I509 Heart failure, unspecified: Secondary | ICD-10-CM | POA: Diagnosis not present

## 2021-05-05 DIAGNOSIS — E785 Hyperlipidemia, unspecified: Secondary | ICD-10-CM | POA: Diagnosis not present

## 2021-05-05 DIAGNOSIS — I2510 Atherosclerotic heart disease of native coronary artery without angina pectoris: Secondary | ICD-10-CM | POA: Diagnosis not present

## 2021-05-05 DIAGNOSIS — G4700 Insomnia, unspecified: Secondary | ICD-10-CM | POA: Diagnosis not present

## 2021-05-05 DIAGNOSIS — I480 Paroxysmal atrial fibrillation: Secondary | ICD-10-CM | POA: Diagnosis not present

## 2021-05-05 DIAGNOSIS — E032 Hypothyroidism due to medicaments and other exogenous substances: Secondary | ICD-10-CM | POA: Diagnosis not present

## 2021-05-05 DIAGNOSIS — I10 Essential (primary) hypertension: Secondary | ICD-10-CM | POA: Diagnosis not present

## 2021-05-05 NOTE — Telephone Encounter (Signed)
Medication list updated.

## 2021-06-28 DIAGNOSIS — D485 Neoplasm of uncertain behavior of skin: Secondary | ICD-10-CM | POA: Diagnosis not present

## 2021-06-28 DIAGNOSIS — L570 Actinic keratosis: Secondary | ICD-10-CM | POA: Diagnosis not present

## 2021-06-28 DIAGNOSIS — D044 Carcinoma in situ of skin of scalp and neck: Secondary | ICD-10-CM | POA: Diagnosis not present

## 2021-07-01 ENCOUNTER — Other Ambulatory Visit: Payer: Self-pay

## 2021-07-01 ENCOUNTER — Telehealth: Payer: Self-pay | Admitting: Physician Assistant

## 2021-07-01 DIAGNOSIS — I4891 Unspecified atrial fibrillation: Secondary | ICD-10-CM

## 2021-07-01 DIAGNOSIS — I480 Paroxysmal atrial fibrillation: Secondary | ICD-10-CM | POA: Diagnosis not present

## 2021-07-01 DIAGNOSIS — R0602 Shortness of breath: Secondary | ICD-10-CM | POA: Diagnosis not present

## 2021-07-01 DIAGNOSIS — I2510 Atherosclerotic heart disease of native coronary artery without angina pectoris: Secondary | ICD-10-CM | POA: Diagnosis not present

## 2021-07-01 MED ORDER — FUROSEMIDE 40 MG PO TABS: 80.0000 mg | ORAL_TABLET | Freq: Two times a day (BID) | ORAL | 6 refills | Status: DC

## 2021-07-01 NOTE — Telephone Encounter (Signed)
 *  STAT* If patient is at the pharmacy, call can be transferred to refill team.   1. Which medications need to be refilled? (please list name of each medication and dose if known) furosemide (LASIX) 40 MG tablet  2. Which pharmacy/location (including street and city if local pharmacy) is medication to be sent to? [place], [place]  3. Do they need a 30 day or 90 day supply? 90 days   Pt said, his dose was increased to take '60mg'$  in the morning and 40 mg at night. He needs refill

## 2021-07-01 NOTE — Progress Notes (Signed)
PER DR KELLY OK TO ORDER-TELEPHONE CALL

## 2021-07-08 HISTORY — PX: TRANSTHORACIC ECHOCARDIOGRAM: SHX275

## 2021-07-15 ENCOUNTER — Other Ambulatory Visit: Payer: Self-pay | Admitting: Cardiovascular Disease

## 2021-08-02 ENCOUNTER — Ambulatory Visit (HOSPITAL_COMMUNITY): Payer: PPO | Attending: Internal Medicine

## 2021-08-02 DIAGNOSIS — I4891 Unspecified atrial fibrillation: Secondary | ICD-10-CM | POA: Diagnosis not present

## 2021-08-02 DIAGNOSIS — I480 Paroxysmal atrial fibrillation: Secondary | ICD-10-CM | POA: Diagnosis not present

## 2021-08-02 LAB — ECHOCARDIOGRAM COMPLETE
Area-P 1/2: 3.89 cm2
P 1/2 time: 618 msec
S' Lateral: 2.9 cm

## 2021-08-02 MED ORDER — PERFLUTREN LIPID MICROSPHERE
1.0000 mL | INTRAVENOUS | Status: AC | PRN
  Administered 2021-08-02: 1 mL via INTRAVENOUS

## 2021-09-21 DIAGNOSIS — T8579XS Infection and inflammatory reaction due to other internal prosthetic devices, implants and grafts, sequela: Secondary | ICD-10-CM | POA: Diagnosis not present

## 2021-09-21 DIAGNOSIS — Z961 Presence of intraocular lens: Secondary | ICD-10-CM | POA: Diagnosis not present

## 2021-09-21 DIAGNOSIS — H353132 Nonexudative age-related macular degeneration, bilateral, intermediate dry stage: Secondary | ICD-10-CM | POA: Diagnosis not present

## 2021-09-21 DIAGNOSIS — H33311 Horseshoe tear of retina without detachment, right eye: Secondary | ICD-10-CM | POA: Diagnosis not present

## 2021-09-21 DIAGNOSIS — H524 Presbyopia: Secondary | ICD-10-CM | POA: Diagnosis not present

## 2021-10-14 ENCOUNTER — Other Ambulatory Visit: Payer: Self-pay | Admitting: Cardiovascular Disease

## 2021-10-14 ENCOUNTER — Other Ambulatory Visit: Payer: Self-pay | Admitting: Internal Medicine

## 2021-10-14 DIAGNOSIS — I4891 Unspecified atrial fibrillation: Secondary | ICD-10-CM

## 2021-10-14 NOTE — Telephone Encounter (Signed)
Prescription refill request for Eliquis received. Indication: Aflutter Last office visit: 03/09/21 ([facility])  Scr: 1.02 (05/03/21)  Age: 76 Weight: 96.6kg  Appropriate dose and refill sent to requested pharmacy.

## 2021-10-18 DIAGNOSIS — H6123 Impacted cerumen, bilateral: Secondary | ICD-10-CM | POA: Diagnosis not present

## 2021-11-09 DIAGNOSIS — R638 Other symptoms and signs concerning food and fluid intake: Secondary | ICD-10-CM | POA: Diagnosis not present

## 2021-11-09 DIAGNOSIS — G4700 Insomnia, unspecified: Secondary | ICD-10-CM | POA: Diagnosis not present

## 2021-11-09 DIAGNOSIS — F419 Anxiety disorder, unspecified: Secondary | ICD-10-CM | POA: Diagnosis not present

## 2021-11-09 DIAGNOSIS — Z6832 Body mass index (BMI) 32.0-32.9, adult: Secondary | ICD-10-CM | POA: Diagnosis not present

## 2021-11-19 DIAGNOSIS — R7301 Impaired fasting glucose: Secondary | ICD-10-CM | POA: Diagnosis not present

## 2021-11-19 DIAGNOSIS — E785 Hyperlipidemia, unspecified: Secondary | ICD-10-CM | POA: Diagnosis not present

## 2021-11-19 DIAGNOSIS — I10 Essential (primary) hypertension: Secondary | ICD-10-CM | POA: Diagnosis not present

## 2021-11-23 DIAGNOSIS — Z1389 Encounter for screening for other disorder: Secondary | ICD-10-CM | POA: Diagnosis not present

## 2021-11-23 DIAGNOSIS — Z23 Encounter for immunization: Secondary | ICD-10-CM | POA: Diagnosis not present

## 2021-11-23 DIAGNOSIS — Z6833 Body mass index (BMI) 33.0-33.9, adult: Secondary | ICD-10-CM | POA: Diagnosis not present

## 2021-11-23 DIAGNOSIS — Z0000 Encounter for general adult medical examination without abnormal findings: Secondary | ICD-10-CM | POA: Diagnosis not present

## 2021-11-30 DIAGNOSIS — E669 Obesity, unspecified: Secondary | ICD-10-CM | POA: Diagnosis not present

## 2021-11-30 DIAGNOSIS — E782 Mixed hyperlipidemia: Secondary | ICD-10-CM | POA: Diagnosis not present

## 2021-11-30 DIAGNOSIS — Z6832 Body mass index (BMI) 32.0-32.9, adult: Secondary | ICD-10-CM | POA: Diagnosis not present

## 2021-11-30 DIAGNOSIS — R7303 Prediabetes: Secondary | ICD-10-CM | POA: Diagnosis not present

## 2021-11-30 DIAGNOSIS — Z1331 Encounter for screening for depression: Secondary | ICD-10-CM | POA: Diagnosis not present

## 2021-11-30 DIAGNOSIS — I10 Essential (primary) hypertension: Secondary | ICD-10-CM | POA: Diagnosis not present

## 2021-11-30 DIAGNOSIS — E8889 Other specified metabolic disorders: Secondary | ICD-10-CM | POA: Diagnosis not present

## 2021-11-30 DIAGNOSIS — I2510 Atherosclerotic heart disease of native coronary artery without angina pectoris: Secondary | ICD-10-CM | POA: Diagnosis not present

## 2021-12-01 DIAGNOSIS — E032 Hypothyroidism due to medicaments and other exogenous substances: Secondary | ICD-10-CM | POA: Diagnosis not present

## 2021-12-01 DIAGNOSIS — I509 Heart failure, unspecified: Secondary | ICD-10-CM | POA: Diagnosis not present

## 2021-12-01 DIAGNOSIS — I10 Essential (primary) hypertension: Secondary | ICD-10-CM | POA: Diagnosis not present

## 2021-12-01 DIAGNOSIS — E782 Mixed hyperlipidemia: Secondary | ICD-10-CM | POA: Diagnosis not present

## 2021-12-06 ENCOUNTER — Encounter: Payer: Self-pay | Admitting: Cardiovascular Disease

## 2021-12-06 NOTE — Telephone Encounter (Signed)
Error

## 2021-12-14 DIAGNOSIS — E669 Obesity, unspecified: Secondary | ICD-10-CM | POA: Diagnosis not present

## 2021-12-14 DIAGNOSIS — I10 Essential (primary) hypertension: Secondary | ICD-10-CM | POA: Diagnosis not present

## 2021-12-14 DIAGNOSIS — R7303 Prediabetes: Secondary | ICD-10-CM | POA: Diagnosis not present

## 2021-12-14 DIAGNOSIS — Z6832 Body mass index (BMI) 32.0-32.9, adult: Secondary | ICD-10-CM | POA: Diagnosis not present

## 2021-12-29 ENCOUNTER — Other Ambulatory Visit: Payer: Self-pay | Admitting: Adult Health

## 2022-01-05 DIAGNOSIS — R7303 Prediabetes: Secondary | ICD-10-CM | POA: Diagnosis not present

## 2022-01-05 DIAGNOSIS — N5201 Erectile dysfunction due to arterial insufficiency: Secondary | ICD-10-CM | POA: Diagnosis not present

## 2022-01-05 DIAGNOSIS — E669 Obesity, unspecified: Secondary | ICD-10-CM | POA: Diagnosis not present

## 2022-01-05 DIAGNOSIS — Z6832 Body mass index (BMI) 32.0-32.9, adult: Secondary | ICD-10-CM | POA: Diagnosis not present

## 2022-01-05 DIAGNOSIS — I10 Essential (primary) hypertension: Secondary | ICD-10-CM | POA: Diagnosis not present

## 2022-01-07 ENCOUNTER — Encounter (INDEPENDENT_AMBULATORY_CARE_PROVIDER_SITE_OTHER): Payer: PPO | Admitting: Ophthalmology

## 2022-01-07 DIAGNOSIS — H33301 Unspecified retinal break, right eye: Secondary | ICD-10-CM | POA: Diagnosis not present

## 2022-01-07 DIAGNOSIS — H353132 Nonexudative age-related macular degeneration, bilateral, intermediate dry stage: Secondary | ICD-10-CM | POA: Diagnosis not present

## 2022-01-07 DIAGNOSIS — H43813 Vitreous degeneration, bilateral: Secondary | ICD-10-CM

## 2022-01-07 DIAGNOSIS — I10 Essential (primary) hypertension: Secondary | ICD-10-CM | POA: Diagnosis not present

## 2022-01-07 DIAGNOSIS — H35033 Hypertensive retinopathy, bilateral: Secondary | ICD-10-CM | POA: Diagnosis not present

## 2022-01-13 DIAGNOSIS — N5201 Erectile dysfunction due to arterial insufficiency: Secondary | ICD-10-CM | POA: Diagnosis not present

## 2022-01-25 ENCOUNTER — Telehealth: Payer: Self-pay | Admitting: Cardiovascular Disease

## 2022-01-25 ENCOUNTER — Other Ambulatory Visit: Payer: Self-pay | Admitting: Urology

## 2022-01-25 NOTE — Telephone Encounter (Signed)
   Pre-operative Risk Assessment    Patient Name: Carl [name]  DOB: January 02, 1946 MRN: [id number]      Request for Surgical Clearance    Procedure:   insertion of inflatable penile implant  Date of Surgery:  03-08-22                                  Surgeon:  Dr Terrilee Files Surgeon's Group or Practice Name:   Phone number:  [phone] Fax number:  [phone]   Type of Clearance Requested:   - Pharmacy:  Hold Apixaban (Eliquis)     Type of Anesthesia:  General    Additional requests/questions:    Lorin Glass   01/25/2022, 12:29 PM

## 2022-01-25 NOTE — Telephone Encounter (Signed)
Patient with diagnosis of atrial fibriliation on Eliquis for anticoagulation.    Procedure: insertion of inflatable penile implant  Date of procedure: 03/08/2022   CHA2DS2-VASc Score = 7   This indicates a 11.2% annual risk of stroke. The patient's score is based upon: CHF History: 1 HTN History: 1 Diabetes History: 0 Stroke History: 2 Vascular Disease History: 1 Age Score: 2 Gender Score: 0   CrCl 78 mL/min(SrCr. 0.88 11/19/2021) Platelet count 238 (Oct 2022    Per office protocol, patient can hold Eliquis for 2 days prior to procedure.     **This guidance is not considered finalized until pre-operative APP has relayed final recommendations.**

## 2022-01-25 NOTE — Telephone Encounter (Signed)
   Patient Name: Carl [name]  DOB: 1945/09/12 MRN: [id number]  Primary Cardiologist: Shelva Majestic, MD  Clinical pharmacists have reviewed the patient's past medical history, labs, and current medications as part of preoperative protocol coverage. The following recommendations have been made:  Patient with diagnosis of atrial fibriliation on Eliquis for anticoagulation.     Procedure: insertion of inflatable penile implant  Date of procedure: 03/08/2022     CHA2DS2-VASc Score = 7   This indicates a 11.2% annual risk of stroke. The patient's score is based upon: CHF History: 1 HTN History: 1 Diabetes History: 0 Stroke History: 2 Vascular Disease History: 1 Age Score: 2 Gender Score: 0     CrCl 78 mL/min(SrCr. 0.88 11/19/2021) Platelet count 238 (Oct 2022       Per office protocol, patient can hold Eliquis for 2 days prior to procedure. Please resume Eliquis as soon as possible postprocedure, at the discretion of the surgeon.    I will route this recommendation to the requesting party via Epic fax function and remove from pre-op pool.  Please call with questions.  Lenna Sciara, NP 01/25/2022, 4:53 PM

## 2022-01-27 DIAGNOSIS — T8579XS Infection and inflammatory reaction due to other internal prosthetic devices, implants and grafts, sequela: Secondary | ICD-10-CM | POA: Diagnosis not present

## 2022-01-27 DIAGNOSIS — H18231 Secondary corneal edema, right eye: Secondary | ICD-10-CM | POA: Diagnosis not present

## 2022-01-28 DIAGNOSIS — H40051 Ocular hypertension, right eye: Secondary | ICD-10-CM | POA: Diagnosis not present

## 2022-01-28 DIAGNOSIS — H18231 Secondary corneal edema, right eye: Secondary | ICD-10-CM | POA: Diagnosis not present

## 2022-01-28 DIAGNOSIS — T8579XS Infection and inflammatory reaction due to other internal prosthetic devices, implants and grafts, sequela: Secondary | ICD-10-CM | POA: Diagnosis not present

## 2022-02-08 DIAGNOSIS — R7303 Prediabetes: Secondary | ICD-10-CM | POA: Diagnosis not present

## 2022-02-08 DIAGNOSIS — I10 Essential (primary) hypertension: Secondary | ICD-10-CM | POA: Diagnosis not present

## 2022-02-08 DIAGNOSIS — E669 Obesity, unspecified: Secondary | ICD-10-CM | POA: Diagnosis not present

## 2022-02-08 DIAGNOSIS — Z6831 Body mass index (BMI) 31.0-31.9, adult: Secondary | ICD-10-CM | POA: Diagnosis not present

## 2022-02-10 DIAGNOSIS — H18231 Secondary corneal edema, right eye: Secondary | ICD-10-CM | POA: Diagnosis not present

## 2022-02-10 DIAGNOSIS — T8579XS Infection and inflammatory reaction due to other internal prosthetic devices, implants and grafts, sequela: Secondary | ICD-10-CM | POA: Diagnosis not present

## 2022-02-10 DIAGNOSIS — H43811 Vitreous degeneration, right eye: Secondary | ICD-10-CM | POA: Diagnosis not present

## 2022-02-10 DIAGNOSIS — H40051 Ocular hypertension, right eye: Secondary | ICD-10-CM | POA: Diagnosis not present

## 2022-02-24 NOTE — Progress Notes (Addendum)
COVID Vaccine Completed: yes  Date of COVID positive in last 90 days: no  PCP - [facility]  Cardiologist - Shelva Majestic, MD  Cardiac clearance 01/25/22 by Diona Browner in Epic  Chest x-ray - n/a EKG - 02/25/22 Epic/chart Stress Test - 01/24/19 epic ECHO - 08/02/21 Epic Cardiac Cath - 11/15/17 epic Pacemaker/ICD device last checked: n/a Spinal Cord Stimulator:n/a  Bowel Prep - no  Sleep Study - yes CPAP - no  Fasting Blood Sugar - pre DM per pt Checks Blood Sugar _____ times a day  Last dose of GLP1 agonist-  Ozempic GLP1 instructions:  hold 7 days, do not take 03/01/22   Last dose of SGLT-2 inhibitors-  N/A SGLT-2 instructions: N/A   Blood Thinner Instructions: Eliquis, hold 5 days per pt Aspirin Instructions: ASA 81, hold 5 days per pt Last Dose: 03/02/22 2000  Activity level: Can go up a flight of stairs and perform activities of daily living without stopping and without symptoms of chest pain or shortness of breath.   Anesthesia review: atherosclerosis, A fib, HTN, CAD, CHF, OSA, CABG x4  Patient denies shortness of breath, fever, cough and chest pain at PAT appointment  Patient verbalized understanding of instructions that were given to them at the PAT appointment. Patient was also instructed that they will need to review over the PAT instructions again at home before surgery.

## 2022-02-25 ENCOUNTER — Encounter (HOSPITAL_COMMUNITY): Payer: Self-pay

## 2022-02-25 ENCOUNTER — Encounter (HOSPITAL_COMMUNITY)
Admission: RE | Admit: 2022-02-25 | Discharge: 2022-02-25 | Disposition: A | Discharge status: Home / Self Care | Payer: PPO | Source: Ambulatory Visit | Attending: Urology | Admitting: Urology

## 2022-02-25 VITALS — BP 123/74 | HR 57 | Temp 97.6°F | Resp 18 | Ht 68.0 in | Wt 206.0 lb

## 2022-02-25 DIAGNOSIS — R8290 Unspecified abnormal findings in urine: Secondary | ICD-10-CM | POA: Diagnosis not present

## 2022-02-25 DIAGNOSIS — Z01818 Encounter for other preprocedural examination: Secondary | ICD-10-CM | POA: Insufficient documentation

## 2022-02-25 DIAGNOSIS — E119 Type 2 diabetes mellitus without complications: Secondary | ICD-10-CM | POA: Diagnosis not present

## 2022-02-25 DIAGNOSIS — G4733 Obstructive sleep apnea (adult) (pediatric): Secondary | ICD-10-CM | POA: Insufficient documentation

## 2022-02-25 DIAGNOSIS — I480 Paroxysmal atrial fibrillation: Secondary | ICD-10-CM | POA: Insufficient documentation

## 2022-02-25 DIAGNOSIS — Z951 Presence of aortocoronary bypass graft: Secondary | ICD-10-CM | POA: Diagnosis not present

## 2022-02-25 DIAGNOSIS — I110 Hypertensive heart disease with heart failure: Secondary | ICD-10-CM | POA: Insufficient documentation

## 2022-02-25 DIAGNOSIS — I5032 Chronic diastolic (congestive) heart failure: Secondary | ICD-10-CM | POA: Insufficient documentation

## 2022-02-25 DIAGNOSIS — I083 Combined rheumatic disorders of mitral, aortic and tricuspid valves: Secondary | ICD-10-CM | POA: Diagnosis not present

## 2022-02-25 DIAGNOSIS — N5201 Erectile dysfunction due to arterial insufficiency: Secondary | ICD-10-CM | POA: Insufficient documentation

## 2022-02-25 DIAGNOSIS — I2510 Atherosclerotic heart disease of native coronary artery without angina pectoris: Secondary | ICD-10-CM

## 2022-02-25 HISTORY — DX: Prediabetes: R73.03

## 2022-02-25 LAB — GLUCOSE, CAPILLARY: Glucose-Capillary: 140 mg/dL — ABNORMAL HIGH (ref 70–99)

## 2022-02-25 LAB — BASIC METABOLIC PANEL
Anion gap: 13 (ref 5–15)
BUN: 30 mg/dL — ABNORMAL HIGH (ref 8–23)
CO2: 24 mmol/L (ref 22–32)
Calcium: 9.2 mg/dL (ref 8.9–10.3)
Chloride: 100 mmol/L (ref 98–111)
Creatinine, Ser: 1.06 mg/dL (ref 0.61–1.24)
GFR, Estimated: >60 mL/min (ref >60)
Glucose, Bld: 127 mg/dL — ABNORMAL HIGH (ref 70–99)
Potassium: 4.1 mmol/L (ref 3.5–5.1)
Sodium: 137 mmol/L (ref 135–145)

## 2022-02-25 LAB — CBC
HCT: 44.8 % (ref 39.0–52.0)
Hemoglobin: 15.2 g/dL (ref 13.0–17.0)
MCH: 32.9 pg (ref 26.0–34.0)
MCHC: 33.9 g/dL (ref 30.0–36.0)
MCV: 97 fL (ref 80.0–100.0)
Platelets: 228 10*3/uL (ref 150–400)
RBC: 4.62 MIL/uL (ref 4.22–5.81)
RDW: 13.1 % (ref 11.5–15.5)
WBC: 8.4 10*3/uL (ref 4.0–10.5)
nRBC: 0 % (ref 0.0–0.2)

## 2022-02-25 LAB — HEMOGLOBIN A1C
Hgb A1c MFr Bld: 5.5 % (ref 4.8–5.6)
Mean Plasma Glucose: 111.15 mg/dL

## 2022-02-25 NOTE — Patient Instructions (Signed)
SURGICAL WAITING ROOM VISITATION  Patients having surgery or a procedure may have no more than 2 support people in the waiting area - these visitors may rotate.    Children under the age of 41 must have an adult with them who is not the patient.  Due to an increase in RSV and influenza rates and associated hospitalizations, children ages 20 and under may not visit patients in [place].  If the patient needs to stay at the hospital during part of their recovery, the visitor guidelines for inpatient rooms apply. Pre-op nurse will coordinate an appropriate time for 1 support person to accompany patient in pre-op.  This support person may not rotate.    Please refer to the [facility] website for the visitor guidelines for Inpatients (after your surgery is over and you are in a regular room).    Your procedure is scheduled on: 03/08/22   Report to [facility] Main Entrance    Report to admitting at 8:00 AM   Call this number if you have problems the morning of surgery [phone]   Do not eat food or drink liquids :After Midnight.          If you have questions, please contact your surgeon's office.   FOLLOW BOWEL PREP AND ANY ADDITIONAL PRE OP INSTRUCTIONS YOU RECEIVED FROM YOUR SURGEON'S OFFICE!!!     Oral Hygiene is also important to reduce your risk of infection.                                    Remember - BRUSH YOUR TEETH THE MORNING OF SURGERY WITH YOUR REGULAR TOOTHPASTE  DENTURES WILL BE REMOVED PRIOR TO SURGERY PLEASE DO NOT APPLY "Poly grip" OR ADHESIVES!!!   Take these medicines the morning of surgery with A SIP OF WATER: Tylenol, Citalopram, Zetia, Synthroid, Metoprolol ,Rosuvastatin  These are anesthesia recommendations for holding your anticoagulants.  Please contact your prescribing physician to confirm IF it is safe to hold your anticoagulants for this length of time.   Eliquis Apixaban   72 hours   Xarelto Rivaroxaban   72 hours  Plavix  Clopidogrel   120 hours  Pletal Cilostazol   120 hours    DO NOT TAKE ANY ORAL DIABETIC MEDICATIONS DAY OF YOUR SURGERY  How to Manage Your Diabetes Before and After Surgery  Why is it important to control my blood sugar before and after surgery? Improving blood sugar levels before and after surgery helps healing and can limit problems. A way of improving blood sugar control is eating a healthy diet by:  Eating less sugar and carbohydrates  Increasing activity/exercise  Talking with your doctor about reaching your blood sugar goals High blood sugars (greater than 180 mg/dL) can raise your risk of infections and slow your recovery, so you will need to focus on controlling your diabetes during the weeks before surgery. Make sure that the doctor who takes care of your diabetes knows about your planned surgery including the date and location.  How do I manage my blood sugar before surgery? Check your blood sugar at least 4 times a day, starting 2 days before surgery, to make sure that the level is not too high or low. Check your blood sugar the morning of your surgery when you wake up and every 2 hours until you get to the Short Stay unit. If your blood sugar is less than  70 mg/dL, you will need to treat for low blood sugar: Do not take insulin. Treat a low blood sugar (less than 70 mg/dL) with  cup of clear juice (cranberry or apple), 4 glucose tablets, OR glucose gel. Recheck blood sugar in 15 minutes after treatment (to make sure it is greater than 70 mg/dL). If your blood sugar is not greater than 70 mg/dL on recheck, call [phone] for further instructions. Report your blood sugar to the short stay nurse when you get to Short Stay.  If you are admitted to the hospital after surgery: Your blood sugar will be checked by the staff and you will probably be given insulin after surgery (instead of oral diabetes medicines) to make sure you have good blood sugar levels. The goal for blood  sugar control after surgery is 80-180 mg/dL.   WHAT DO I DO ABOUT MY DIABETES MEDICATION?  Do not take oral diabetes medicines (pills) the morning of surgery.  Hold Ozempic 7 days before surgery. Do not take Ozempic 03/01/22  THE DAY BEFORE SURGERY, take Metformin as prescribed.      THE MORNING OF SURGERY, do not take Metformin.   DO NOT TAKE THE FOLLOWING 7 DAYS PRIOR TO SURGERY: Ozempic, Wegovy, Rybelsus (Semaglutide), Byetta (exenatide), Bydureon (exenatide ER), Victoza, Saxenda (liraglutide), or Trulicity (dulaglutide) Mounjaro (Tirzepatide) Adlyxin (Lixisenatide), Polyethylene Glycol Loxenatide.  Reviewed and Endorsed by [facility] Patient Education Committee, August 2015  Bring CPAP mask and tubing day of surgery.                              You may not have any metal on your body including hair pins, jewelry, and body piercing             Do not wear make-up, lotions, powders, perfumes/cologne, or deodorant  Do not shave  48 hours prior to surgery.               Men may shave face and neck.   Do not bring valuables to the hospital. [place].   Contacts, glasses, dentures or bridgework may not be worn into surgery.   Bring small overnight bag day of surgery.   DO NOT [place]. PHARMACY WILL DISPENSE MEDICATIONS LISTED ON YOUR MEDICATION LIST TO YOU DURING YOUR ADMISSION [place]!   Special Instructions: Bring a copy of your healthcare power of attorney and living will documents the day of surgery if you haven't scanned them before.              Please read over the following fact sheets you were given: IF [place] [phone] Schneiders    If you received a COVID test during your pre-op visit  it is requested that you wear a mask when out in public, stay away from anyone that may not be feeling well and notify your surgeon if  you develop symptoms. If you test positive for Covid or have been in contact with anyone that has tested positive in the last 10 days please notify you surgeon.    [place] - Preparing for Surgery Before surgery, you can play an important role.  Because skin is not sterile, your skin needs to be as free of germs as possible.  You can reduce  the number of germs on your skin by washing with CHG (chlorahexidine gluconate) soap before surgery.  CHG is an antiseptic cleaner which kills germs and bonds with the skin to continue killing germs even after washing. Please DO NOT use if you have an allergy to CHG or antibacterial soaps.  If your skin becomes reddened/irritated stop using the CHG and inform your nurse when you arrive at Short Stay. Do not shave (including legs and underarms) for at least 48 hours prior to the first CHG shower.  You may shave your face/neck.  Please follow these instructions carefully:  1.  Shower with CHG Soap the night before surgery and the  morning of surgery.  2.  If you choose to wash your hair, wash your hair first as usual with your normal  shampoo.  3.  After you shampoo, rinse your hair and body thoroughly to remove the shampoo.                             4.  Use CHG as you would any other liquid soap.  You can apply chg directly to the skin and wash.  Gently with a scrungie or clean washcloth.  5.  Apply the CHG Soap to your body ONLY FROM THE NECK DOWN.   Do   not use on face/ open                           Wound or open sores. Avoid contact with eyes, ears mouth and   genitals (private parts).                       Wash face,  Genitals (private parts) with your normal soap.             6.  Wash thoroughly, paying special attention to the area where your    surgery  will be performed.  7.  Thoroughly rinse your body with warm water from the neck down.  8.  DO NOT shower/wash with your normal soap after using and rinsing off the CHG Soap.                9.  Pat  yourself dry with a clean towel.            10.  Wear clean pajamas.            11.  Place clean sheets on your bed the night of your first shower and do not  sleep with pets. Day of Surgery : Do not apply any lotions/deodorants the morning of surgery.  Please wear clean clothes to the hospital/surgery center.  FAILURE TO FOLLOW THESE INSTRUCTIONS MAY RESULT IN THE CANCELLATION OF YOUR SURGERY  PATIENT SIGNATURE_________________________________  NURSE SIGNATURE__________________________________  ________________________________________________________________________

## 2022-02-27 LAB — URINE CULTURE: Culture: NO GROWTH

## 2022-02-28 NOTE — Progress Notes (Signed)
Anesthesia Chart Review   Case: [id number] Date/Time: 03/08/22 1000   Procedure: INSERTION OF INFLATABLE PENILE IMPLANT - 20 MINUTES NEEDED FOR CASE   Anesthesia type: General   Pre-op diagnosis: ERECTILE DYSFUNCTION DUE TO ARTERIAL INSUFFICIENCY   [facility] ROOM 03 / [facility]   Surgeons: Vira Agar, MD       DISCUSSION:76 y.o. never smoker with h/o HTN, OSA, CAD (CABG 2019), PAF, CHF, erectile dysfunction scheduled fora bove procedure 03/08/2022 with Dr. Terrilee Files.   Per cardiology pt can hold Eliquis 2 days prior to procedure.   Pt last seen by cardiology 03/09/2021. Per OV note CABG 2019, reassuring stress test 2020.   Anticipate pt can proceed with planned procedure barring acute status change.   VS: BP 123/74   Pulse (!) 57   Temp 36.4 C (Oral)   Resp 18   Ht '5\' 8"'$  (1.727 m)   Wt 93.4 kg   SpO2 98%   BMI 31.32 kg/m   PROVIDERS: [facility] [employer] [facility]  Cardiologist - Shelva Majestic, MD  LABS: Labs reviewed: Acceptable for surgery. (all labs ordered are listed, but only abnormal results are displayed)  Labs Reviewed  BASIC METABOLIC PANEL - Abnormal; Notable for the following components:      Result Value   Glucose, Bld 127 (*)    BUN 30 (*)    All other components within normal limits  GLUCOSE, CAPILLARY - Abnormal; Notable for the following components:   Glucose-Capillary 140 (*)    All other components within normal limits  URINE CULTURE  CBC  HEMOGLOBIN A1C     IMAGES:   EKG:   CV: Echo 08/02/2021 1. Left ventricular ejection fraction, by estimation, is 65 to 70%. The  left ventricle has normal function. The left ventricle has no regional  wall motion abnormalities. Left ventricular diastolic parameters are  indeterminate.   2. Right ventricular systolic function is normal. The right ventricular  size is normal. There is mildly elevated pulmonary artery systolic  pressure.   3. Left atrial size was mildly dilated.   4. Right  atrial size was moderately dilated.   5. Mild mitral valve regurgitation.   6. Tricuspid valve regurgitation is mild to moderate.   7. The aortic valve is tricuspid. Aortic valve regurgitation is mild.  Aortic valve sclerosis/calcification is present, without any evidence of  aortic stenosis.   8. The inferior vena cava is dilated in size with <50% respiratory  variability, suggesting right atrial pressure of 15 mmHg.   Comparison(s): The left ventricular function is unchanged. 10/28/20 EF  55-60%.   Myocardial Perfusion 01/24/2019 1.  Normal myocardial perfusion imaging study without evidence of ischemia or infarction. 2.  Normal left ventricular ejection fraction, 60%. 3.  Abnormal septal motion consistent with a postoperative state. 4.  This is a low risk study. Past Medical History:  Diagnosis Date   Anxiety    takes Xanax daily as needed   Arthritis    CAD (coronary artery disease)    s/p CABG   Cataract    removed both eyes   Chronic diastolic CHF (congestive heart failure) ([place])    Depression    takes Celexa daily   Diverticulitis    Diverticulosis    Enlarged prostate    sightly   Esophagitis    Gastric ulcer    GERD (gastroesophageal reflux disease)    takes Protonix daily   Hx of adenomatous colonic polyps    benign   Hyperlipidemia  takes Zocor daily   Hypertension    Hypothyroidism    Insomnia    takes Ambien nightly   Joint pain    Joint swelling    OSA (obstructive sleep apnea)    Paroxysmal atrial fibrillation (HCC)    On Eliquis   Pre-diabetes     Past Surgical History:  Procedure Laterality Date   A-FLUTTER ABLATION N/A 01/11/2019   Procedure: A-FLUTTER ABLATION;  Surgeon: Evans Lance, MD;  Location: [place] CV LAB;  Service: Cardiovascular;  Laterality: N/A;   CARDIAC CATHETERIZATION     CARDIOVERSION N/A 11/20/2018   Procedure: CARDIOVERSION;  Surgeon: Troy Sine, MD;  Location: [facility] ENDOSCOPY;  Service: Cardiovascular;   Laterality: N/A;   cartliage removed from nose  72yr ago   cataract surgery Bilateral    CERVICAL SPINE SURGERY     COLONOSCOPY     CORONARY ARTERY BYPASS GRAFT N/A 11/16/2017   Procedure: CORONARY ARTERY BYPASS GRAFTING (CABG) x4. LEFT ENDOSCOPIC SAPHENOUS VEIN HARVEST AND MAMMARY ARTERY TAKE DOWN. LIMA TO LAD, SVG TO PDA, SVG TO DISTAL CIRC & OMI.;  Surgeon: GGrace Isaac MD;  [facility]  Service: Open Heart Surgery;  Laterality: N/A;   EYE SURGERY     FOREIGN BODY REMOVAL ESOPHAGEAL     KNEE SURGERY Left    couple of times   LEFT HEART CATH AND CORONARY ANGIOGRAPHY N/A 11/15/2017   Procedure: LEFT HEART CATH AND CORONARY ANGIOGRAPHY;  Surgeon: KTroy Sine MD;  [facility];  Service: Cardiovascular;  Laterality: N/A;   NASAL SINUS SURGERY     POLYPECTOMY     TEE WITHOUT CARDIOVERSION N/A 11/16/2017   Procedure: TRANSESOPHAGEAL ECHOCARDIOGRAM (TEE);  Surgeon: GGrace Isaac MD;  Location: [facility]  Service: Open Heart Surgery;  Laterality: N/A;   TONSILLECTOMY     TOTAL HIP ARTHROPLASTY Right 04/17/2020   Procedure: RIGHT TOTAL HIP ARTHROPLASTY ANTERIOR APPROACH;  Surgeon: BMcarthur Rossetti MD;  [facility];  Service: Orthopedics;  Laterality: Right;   TOTAL KNEE ARTHROPLASTY Left 06/17/2014   Procedure: TOTAL KNEE ARTHROPLASTY;  Surgeon: PGarald Balding MD;  [place] [facility]  Service: Orthopedics;  Laterality: Left;    MEDICATIONS:  acetaminophen (TYLENOL) 500 MG tablet   acyclovir (ZOVIRAX) 400 MG tablet   aspirin EC 81 MG tablet   citalopram (CELEXA) 20 MG tablet   ELIQUIS 5 MG TABS tablet   ezetimibe (ZETIA) 10 MG tablet   ferrous sulfate 325 (65 FE) MG tablet   furosemide (LASIX) 40 MG tablet   icosapent Ethyl (VASCEPA) 1 g capsule   levothyroxine (SYNTHROID) 150 MCG tablet   lisinopril (ZESTRIL) 10 MG tablet   metFORMIN (GLUCOPHAGE-XR) 500 MG 24 hr tablet   metoprolol tartrate (LOPRESSOR) 50 MG tablet   Misc Natural  Products (JOINT HEALTH PO)   Multiple Vitamins-Minerals (PRESERVISION AREDS 2 PO)   nitroGLYCERIN (NITROSTAT) 0.4 MG SL tablet   OZEMPIC, 1 MG/DOSE, 4 MG/3ML SOPN   pantoprazole (PROTONIX) 20 MG tablet   potassium chloride (KLOR-CON) 20 MEQ packet   Potassium Chloride ER 20 MEQ TBCR   rosuvastatin (CRESTOR) 40 MG tablet   sildenafil (VIAGRA) 100 MG tablet   timolol (TIMOPTIC) 0.5 % ophthalmic solution   zolpidem (AMBIEN) 10 MG tablet   No current facility-administered medications for this encounter.     JKonrad FelixWard, PA-C [facility]-Surgical Testing [phone]

## 2022-03-07 NOTE — Anesthesia Preprocedure Evaluation (Signed)
Anesthesia Evaluation  Patient identified by MRN, date of birth, ID band Patient awake    Reviewed: Allergy & Precautions, NPO status , Patient's Chart, lab work & pertinent test results  History of Anesthesia Complications Negative for: history of anesthetic complications  Airway Mallampati: III  TM Distance: >3 FB Neck ROM: Full   Comment: Previous grade I view with [place] 3, 2-person mask ventilation with OPA Dental  (+) Dental Advisory Given, Teeth Intact   Pulmonary neg shortness of breath, sleep apnea (patient denies) , neg COPD, neg recent URI   Pulmonary exam normal breath sounds clear to auscultation       Cardiovascular hypertension (furosemide, lisinopril, metoprolol), Pt. on medications and Pt. on home beta blockers (-) angina + CAD, + CABG (x4 11/16/2017) and +CHF  + dysrhythmias Atrial Fibrillation + Valvular Problems/Murmurs MR  Rhythm:Regular Rate:Normal  HLD  TTE 08/02/2021: IMPRESSIONS     1. Left ventricular ejection fraction, by estimation, is 65 to 70%. The  left ventricle has normal function. The left ventricle has no regional  wall motion abnormalities. Left ventricular diastolic parameters are  indeterminate.   2. Right ventricular systolic function is normal. The right ventricular  size is normal. There is mildly elevated pulmonary artery systolic  pressure.   3. Left atrial size was mildly dilated.   4. Right atrial size was moderately dilated.   5. Mild mitral valve regurgitation.   6. Tricuspid valve regurgitation is mild to moderate.   7. The aortic valve is tricuspid. Aortic valve regurgitation is mild.  Aortic valve sclerosis/calcification is present, without any evidence of  aortic stenosis.   8. The inferior vena cava is dilated in size with <50% respiratory  variability, suggesting right atrial pressure of 15 mmHg.   Stress Test 01/24/2019: low-risk    Neuro/Psych  PSYCHIATRIC  DISORDERS Anxiety Depression    TIA   GI/Hepatic Neg liver ROS, PUD,GERD  Medicated,,  Endo/Other  Hypothyroidism  Pre-diabetes  Renal/GU negative Renal ROS     Musculoskeletal  (+) Arthritis , Osteoarthritis,    Abdominal  (+) + obese  Peds  Hematology negative hematology ROS (+)   Anesthesia Other Findings Last Eliquis: 03/04/2022  Last Ozempic: 02/22/2022  Reproductive/Obstetrics                             Anesthesia Physical Anesthesia Plan  ASA: 3  Anesthesia Plan: General   Post-op Pain Management: Tylenol PO (pre-op)*   Induction: Intravenous  PONV Risk Score and Plan: 2 and Ondansetron, Dexamethasone and Treatment may vary due to age or medical condition  Airway Management Planned: Oral ETT  Additional Equipment:   Intra-op Plan:   Post-operative Plan: Extubation in OR  Informed Consent: I have reviewed the patients History and Physical, chart, labs and discussed the procedure including the risks, benefits and alternatives for the proposed anesthesia with the patient or authorized representative who has indicated his/her understanding and acceptance.     Dental advisory given  Plan Discussed with: CRNA and Anesthesiologist  Anesthesia Plan Comments: (Risks of general anesthesia discussed including, but not limited to, sore throat, hoarse voice, chipped/damaged teeth, injury to vocal cords, nausea and vomiting, allergic reactions, lung infection, heart attack, stroke, and death. All questions answered. )        Anesthesia Quick Evaluation

## 2022-03-08 ENCOUNTER — Encounter (HOSPITAL_COMMUNITY)
Admission: RE | Disposition: A | Discharge status: Home / Self Care | Payer: Self-pay | Source: Ambulatory Visit | Attending: Urology

## 2022-03-08 ENCOUNTER — Observation Stay (HOSPITAL_COMMUNITY)
Admission: RE | Admit: 2022-03-08 | Discharge: 2022-03-09 | Disposition: A | Discharge status: Home / Self Care | Payer: PPO | Source: Ambulatory Visit | Attending: Urology | Admitting: Urology

## 2022-03-08 ENCOUNTER — Other Ambulatory Visit: Payer: Self-pay

## 2022-03-08 ENCOUNTER — Ambulatory Visit (HOSPITAL_COMMUNITY): Payer: PPO | Admitting: Physician Assistant

## 2022-03-08 ENCOUNTER — Ambulatory Visit (HOSPITAL_BASED_OUTPATIENT_CLINIC_OR_DEPARTMENT_OTHER): Payer: PPO | Admitting: Anesthesiology

## 2022-03-08 ENCOUNTER — Encounter (HOSPITAL_COMMUNITY): Payer: Self-pay | Admitting: Urology

## 2022-03-08 DIAGNOSIS — N528 Other male erectile dysfunction: Secondary | ICD-10-CM | POA: Diagnosis not present

## 2022-03-08 DIAGNOSIS — I2510 Atherosclerotic heart disease of native coronary artery without angina pectoris: Secondary | ICD-10-CM | POA: Insufficient documentation

## 2022-03-08 DIAGNOSIS — I5032 Chronic diastolic (congestive) heart failure: Secondary | ICD-10-CM | POA: Diagnosis not present

## 2022-03-08 DIAGNOSIS — Z951 Presence of aortocoronary bypass graft: Secondary | ICD-10-CM | POA: Diagnosis not present

## 2022-03-08 DIAGNOSIS — Z7984 Long term (current) use of oral hypoglycemic drugs: Secondary | ICD-10-CM | POA: Insufficient documentation

## 2022-03-08 DIAGNOSIS — I771 Stricture of artery: Secondary | ICD-10-CM | POA: Diagnosis not present

## 2022-03-08 DIAGNOSIS — Z79899 Other long term (current) drug therapy: Secondary | ICD-10-CM | POA: Diagnosis not present

## 2022-03-08 DIAGNOSIS — Z960 Presence of urogenital implants: Secondary | ICD-10-CM

## 2022-03-08 DIAGNOSIS — E039 Hypothyroidism, unspecified: Secondary | ICD-10-CM | POA: Diagnosis not present

## 2022-03-08 DIAGNOSIS — Z7901 Long term (current) use of anticoagulants: Secondary | ICD-10-CM | POA: Diagnosis not present

## 2022-03-08 DIAGNOSIS — N529 Male erectile dysfunction, unspecified: Secondary | ICD-10-CM

## 2022-03-08 DIAGNOSIS — Z7982 Long term (current) use of aspirin: Secondary | ICD-10-CM | POA: Insufficient documentation

## 2022-03-08 DIAGNOSIS — N521 Erectile dysfunction due to diseases classified elsewhere: Secondary | ICD-10-CM | POA: Diagnosis not present

## 2022-03-08 DIAGNOSIS — Z96652 Presence of left artificial knee joint: Secondary | ICD-10-CM | POA: Diagnosis not present

## 2022-03-08 DIAGNOSIS — I110 Hypertensive heart disease with heart failure: Secondary | ICD-10-CM | POA: Insufficient documentation

## 2022-03-08 DIAGNOSIS — E785 Hyperlipidemia, unspecified: Secondary | ICD-10-CM | POA: Diagnosis not present

## 2022-03-08 DIAGNOSIS — Z96641 Presence of right artificial hip joint: Secondary | ICD-10-CM | POA: Insufficient documentation

## 2022-03-08 DIAGNOSIS — I5033 Acute on chronic diastolic (congestive) heart failure: Secondary | ICD-10-CM | POA: Diagnosis not present

## 2022-03-08 DIAGNOSIS — I480 Paroxysmal atrial fibrillation: Secondary | ICD-10-CM | POA: Diagnosis not present

## 2022-03-08 DIAGNOSIS — G4730 Sleep apnea, unspecified: Secondary | ICD-10-CM | POA: Diagnosis not present

## 2022-03-08 HISTORY — PX: PENILE PROSTHESIS IMPLANT: SHX240

## 2022-03-08 LAB — GLUCOSE, CAPILLARY
Glucose-Capillary: 104 mg/dL — ABNORMAL HIGH (ref 70–99)
Glucose-Capillary: 134 mg/dL — ABNORMAL HIGH (ref 70–99)
Glucose-Capillary: 148 mg/dL — ABNORMAL HIGH (ref 70–99)

## 2022-03-08 SURGERY — INSERTION, PENILE PROSTHESIS, INFLATABLE
Anesthesia: General | Site: Penis

## 2022-03-08 MED ORDER — METOPROLOL TARTRATE 50 MG PO TABS
50.0000 mg | ORAL_TABLET | Freq: Two times a day (BID) | ORAL | Status: DC
  Administered 2022-03-08 – 2022-03-09 (×2): 50 mg via ORAL
  Filled 2022-03-08 (×2): qty 1

## 2022-03-08 MED ORDER — LIDOCAINE HCL (PF) 2 % IJ SOLN
INTRAMUSCULAR | Status: AC
  Filled 2022-03-08: qty 5

## 2022-03-08 MED ORDER — ACETAMINOPHEN 500 MG PO TABS
1000.0000 mg | ORAL_TABLET | Freq: Four times a day (QID) | ORAL | Status: DC
  Administered 2022-03-08 – 2022-03-09 (×3): 1000 mg via ORAL
  Filled 2022-03-08 (×3): qty 2

## 2022-03-08 MED ORDER — CELECOXIB 200 MG PO CAPS: 200.0000 mg | ORAL_CAPSULE | Freq: Two times a day (BID) | ORAL | 1 refills | Status: DC

## 2022-03-08 MED ORDER — PANTOPRAZOLE SODIUM 40 MG PO TBEC
40.0000 mg | DELAYED_RELEASE_TABLET | Freq: Every day | ORAL | Status: DC
  Administered 2022-03-08: 40 mg via ORAL
  Filled 2022-03-08: qty 1

## 2022-03-08 MED ORDER — LIDOCAINE HCL 1 % IJ SOLN
INTRAMUSCULAR | Status: AC
  Filled 2022-03-08: qty 20

## 2022-03-08 MED ORDER — CELECOXIB 200 MG PO CAPS
200.0000 mg | ORAL_CAPSULE | Freq: Two times a day (BID) | ORAL | Status: DC
  Administered 2022-03-08 – 2022-03-09 (×2): 200 mg via ORAL
  Filled 2022-03-08 (×2): qty 1

## 2022-03-08 MED ORDER — CHLORHEXIDINE GLUCONATE 4 % EX LIQD: Freq: Once | CUTANEOUS | Status: DC

## 2022-03-08 MED ORDER — SODIUM CHLORIDE 0.45 % IV SOLN: INTRAVENOUS | Status: DC

## 2022-03-08 MED ORDER — ACETAMINOPHEN 500 MG PO TABS: 1000.0000 mg | ORAL_TABLET | Freq: Four times a day (QID) | ORAL | 1 refills | Status: AC

## 2022-03-08 MED ORDER — LACTATED RINGERS IV SOLN: INTRAVENOUS | Status: DC

## 2022-03-08 MED ORDER — FENTANYL CITRATE (PF) 100 MCG/2ML IJ SOLN
INTRAMUSCULAR | Status: DC | PRN
  Administered 2022-03-08 (×2): 50 ug via INTRAVENOUS
  Administered 2022-03-08: 100 ug via INTRAVENOUS

## 2022-03-08 MED ORDER — ACETAMINOPHEN 500 MG PO TABS
1000.0000 mg | ORAL_TABLET | Freq: Once | ORAL | Status: AC
  Administered 2022-03-08: 1000 mg via ORAL
  Filled 2022-03-08: qty 2

## 2022-03-08 MED ORDER — ONDANSETRON HCL 4 MG/2ML IJ SOLN: 4.0000 mg | INTRAMUSCULAR | Status: DC | PRN

## 2022-03-08 MED ORDER — AMISULPRIDE (ANTIEMETIC) 5 MG/2ML IV SOLN: 10.0000 mg | Freq: Once | INTRAVENOUS | Status: DC | PRN

## 2022-03-08 MED ORDER — INSULIN ASPART 100 UNIT/ML IJ SOLN: 0.0000 [IU] | Freq: Every day | INTRAMUSCULAR | Status: DC

## 2022-03-08 MED ORDER — HYDROMORPHONE HCL 1 MG/ML IJ SOLN: 0.5000 mg | INTRAMUSCULAR | Status: DC | PRN

## 2022-03-08 MED ORDER — ACYCLOVIR 400 MG PO TABS
400.0000 mg | ORAL_TABLET | Freq: Two times a day (BID) | ORAL | Status: DC
  Administered 2022-03-08 – 2022-03-09 (×2): 400 mg via ORAL
  Filled 2022-03-08 (×2): qty 1

## 2022-03-08 MED ORDER — FENTANYL CITRATE PF 50 MCG/ML IJ SOSY: 25.0000 ug | PREFILLED_SYRINGE | INTRAMUSCULAR | Status: DC | PRN

## 2022-03-08 MED ORDER — OXYCODONE HCL 5 MG/5ML PO SOLN: 5.0000 mg | Freq: Once | ORAL | Status: DC | PRN

## 2022-03-08 MED ORDER — EPHEDRINE 5 MG/ML INJ
INTRAVENOUS | Status: AC
  Filled 2022-03-08: qty 5

## 2022-03-08 MED ORDER — SUGAMMADEX SODIUM 200 MG/2ML IV SOLN
INTRAVENOUS | Status: DC | PRN
  Administered 2022-03-08: 200 mg via INTRAVENOUS

## 2022-03-08 MED ORDER — FENTANYL CITRATE (PF) 250 MCG/5ML IJ SOLN
INTRAMUSCULAR | Status: AC
  Filled 2022-03-08: qty 5

## 2022-03-08 MED ORDER — IRRISEPT - 450ML BOTTLE WITH 0.05% CHG IN STERILE WATER, USP 99.95% OPTIME
TOPICAL | Status: DC | PRN
  Administered 2022-03-08: 450 mL via TOPICAL

## 2022-03-08 MED ORDER — PROPOFOL 10 MG/ML IV BOLUS
INTRAVENOUS | Status: AC
  Filled 2022-03-08: qty 20

## 2022-03-08 MED ORDER — OXYCODONE HCL 5 MG PO TABS: 5.0000 mg | ORAL_TABLET | ORAL | Status: DC | PRN

## 2022-03-08 MED ORDER — ORAL CARE MOUTH RINSE: 15.0000 mL | Freq: Once | OROMUCOSAL | Status: AC

## 2022-03-08 MED ORDER — SODIUM CHLORIDE 0.9 % IR SOLN
Status: DC | PRN
  Administered 2022-03-08: 1000 mL

## 2022-03-08 MED ORDER — ONDANSETRON HCL 4 MG/2ML IJ SOLN
INTRAMUSCULAR | Status: AC
  Filled 2022-03-08: qty 2

## 2022-03-08 MED ORDER — VANCOMYCIN HCL 1500 MG/300ML IV SOLN
1500.0000 mg | INTRAVENOUS | Status: AC
  Administered 2022-03-08: 1500 mg via INTRAVENOUS
  Filled 2022-03-08: qty 300

## 2022-03-08 MED ORDER — BUPIVACAINE HCL (PF) 0.5 % IJ SOLN
INTRAMUSCULAR | Status: AC
  Filled 2022-03-08: qty 30

## 2022-03-08 MED ORDER — GENTAMICIN SULFATE 40 MG/ML IJ SOLN
390.0000 mg | INTRAVENOUS | Status: AC
  Administered 2022-03-08: 390 mg via INTRAVENOUS
  Filled 2022-03-08: qty 9.75

## 2022-03-08 MED ORDER — ROSUVASTATIN CALCIUM 20 MG PO TABS
20.0000 mg | ORAL_TABLET | Freq: Every day | ORAL | Status: DC
  Administered 2022-03-09: 20 mg via ORAL
  Filled 2022-03-08: qty 1

## 2022-03-08 MED ORDER — PHENYLEPHRINE HCL-NACL 20-0.9 MG/250ML-% IV SOLN
INTRAVENOUS | Status: DC | PRN
  Administered 2022-03-08: 35 ug/min via INTRAVENOUS

## 2022-03-08 MED ORDER — CHLORHEXIDINE GLUCONATE 0.12 % MT SOLN
15.0000 mL | Freq: Once | OROMUCOSAL | Status: AC
  Administered 2022-03-08: 15 mL via OROMUCOSAL

## 2022-03-08 MED ORDER — SULFAMETHOXAZOLE-TRIMETHOPRIM 800-160 MG PO TABS: 1.0000 | ORAL_TABLET | Freq: Two times a day (BID) | ORAL | 0 refills | Status: DC

## 2022-03-08 MED ORDER — LISINOPRIL 10 MG PO TABS
10.0000 mg | ORAL_TABLET | Freq: Every day | ORAL | Status: DC
  Administered 2022-03-08 – 2022-03-09 (×2): 10 mg via ORAL
  Filled 2022-03-08 (×2): qty 1

## 2022-03-08 MED ORDER — DEXAMETHASONE SODIUM PHOSPHATE 10 MG/ML IJ SOLN
INTRAMUSCULAR | Status: DC | PRN
  Administered 2022-03-08: 5 mg via INTRAVENOUS

## 2022-03-08 MED ORDER — ROCURONIUM BROMIDE 10 MG/ML (PF) SYRINGE
PREFILLED_SYRINGE | INTRAVENOUS | Status: AC
  Filled 2022-03-08: qty 10

## 2022-03-08 MED ORDER — STERILE WATER FOR IRRIGATION IR SOLN
Status: DC | PRN
  Administered 2022-03-08: 1000 mL

## 2022-03-08 MED ORDER — OXYCODONE HCL 5 MG PO TABS: 5.0000 mg | ORAL_TABLET | Freq: Four times a day (QID) | ORAL | 0 refills | Status: DC | PRN

## 2022-03-08 MED ORDER — LIDOCAINE HCL (PF) 1 % IJ SOLN
INTRAMUSCULAR | Status: DC | PRN
  Administered 2022-03-08: 20 mL

## 2022-03-08 MED ORDER — ONDANSETRON HCL 4 MG/2ML IJ SOLN
INTRAMUSCULAR | Status: DC | PRN
  Administered 2022-03-08: 4 mg via INTRAVENOUS

## 2022-03-08 MED ORDER — VANCOMYCIN HCL IN DEXTROSE 1-5 GM/200ML-% IV SOLN
1000.0000 mg | Freq: Two times a day (BID) | INTRAVENOUS | Status: AC
  Administered 2022-03-08: 1000 mg via INTRAVENOUS
  Filled 2022-03-08: qty 200

## 2022-03-08 MED ORDER — MUPIROCIN 2 % EX OINT
TOPICAL_OINTMENT | Freq: Once | CUTANEOUS | Status: AC
  Administered 2022-03-08: 1 via NASAL
  Filled 2022-03-08: qty 22

## 2022-03-08 MED ORDER — ROCURONIUM BROMIDE 10 MG/ML (PF) SYRINGE
PREFILLED_SYRINGE | INTRAVENOUS | Status: DC | PRN
  Administered 2022-03-08: 20 mg via INTRAVENOUS
  Administered 2022-03-08: 50 mg via INTRAVENOUS

## 2022-03-08 MED ORDER — PROPOFOL 10 MG/ML IV BOLUS
INTRAVENOUS | Status: DC | PRN
  Administered 2022-03-08: 150 mg via INTRAVENOUS

## 2022-03-08 MED ORDER — EPHEDRINE SULFATE-NACL 50-0.9 MG/10ML-% IV SOSY
PREFILLED_SYRINGE | INTRAVENOUS | Status: DC | PRN
  Administered 2022-03-08: 5 mg via INTRAVENOUS
  Administered 2022-03-08 (×2): 10 mg via INTRAVENOUS

## 2022-03-08 MED ORDER — ACETAMINOPHEN 500 MG PO TABS: 1000.0000 mg | ORAL_TABLET | Freq: Four times a day (QID) | ORAL | Status: DC | PRN

## 2022-03-08 MED ORDER — OXYCODONE HCL 5 MG PO TABS: 5.0000 mg | ORAL_TABLET | Freq: Once | ORAL | Status: DC | PRN

## 2022-03-08 MED ORDER — INSULIN ASPART 100 UNIT/ML IJ SOLN
0.0000 [IU] | Freq: Three times a day (TID) | INTRAMUSCULAR | Status: DC
  Administered 2022-03-08: 2 [IU] via SUBCUTANEOUS

## 2022-03-08 MED ORDER — LIDOCAINE 2% (20 MG/ML) 5 ML SYRINGE
INTRAMUSCULAR | Status: DC | PRN
  Administered 2022-03-08: 100 mg via INTRAVENOUS

## 2022-03-08 MED ORDER — FLUCONAZOLE IN SODIUM CHLORIDE 200-0.9 MG/100ML-% IV SOLN
200.0000 mg | Freq: Once | INTRAVENOUS | Status: AC
  Administered 2022-03-08: 200 mg via INTRAVENOUS
  Filled 2022-03-08: qty 100

## 2022-03-08 MED ORDER — DEXAMETHASONE SODIUM PHOSPHATE 10 MG/ML IJ SOLN
INTRAMUSCULAR | Status: AC
  Filled 2022-03-08: qty 1

## 2022-03-08 MED ORDER — CITALOPRAM HYDROBROMIDE 20 MG PO TABS
20.0000 mg | ORAL_TABLET | Freq: Every day | ORAL | Status: DC
  Administered 2022-03-09: 20 mg via ORAL
  Filled 2022-03-08: qty 1

## 2022-03-08 SURGICAL SUPPLY — 53 items
ADH SKN CLS APL DERMABOND .7 (GAUZE/BANDAGES/DRESSINGS) ×1
APL PRP STRL LF DISP 70% ISPRP (MISCELLANEOUS) ×2
BAG DECANTER FOR FLEXI CONT (MISCELLANEOUS) ×2 IMPLANT
BAG DRN RND TRDRP ANRFLXCHMBR (UROLOGICAL SUPPLIES) ×1
BAG URINE DRAIN 2000ML AR STRL (UROLOGICAL SUPPLIES) ×2 IMPLANT
BNDG GAUZE DERMACEA FLUFF 4 (GAUZE/BANDAGES/DRESSINGS) ×2 IMPLANT
BNDG GZE DERMACEA 4 6PLY (GAUZE/BANDAGES/DRESSINGS) ×1
BRIEF MESH DISP LRG (UNDERPADS AND DIAPERS) IMPLANT
CATH COUDE 5CC RIBBED (CATHETERS) ×2 IMPLANT
CATH RIBBED COUDE 5CC (CATHETERS) ×1
CHLORAPREP W/TINT 26 (MISCELLANEOUS) ×4 IMPLANT
COVER MAYO STAND STRL (DRAPES) ×2 IMPLANT
COVER SURGICAL LIGHT HANDLE (MISCELLANEOUS) ×2 IMPLANT
DERMABOND ADVANCED .7 DNX12 (GAUZE/BANDAGES/DRESSINGS) ×2 IMPLANT
DRAIN CHANNEL 10F 3/8 F FF (DRAIN) IMPLANT
DRAIN CHANNEL 15F RND FF 3/16 (WOUND CARE) IMPLANT
DRAPE INCISE IOBAN 66X45 STRL (DRAPES) ×2 IMPLANT
DRAPE LAPAROTOMY T 98X78 PEDS (DRAPES) ×2 IMPLANT
ELECT REM PT RETURN 15FT ADLT (MISCELLANEOUS) ×2 IMPLANT
EVACUATOR SILICONE 100CC (DRAIN) ×2 IMPLANT
GLOVE BIO SURGEON STRL SZ7 (GLOVE) ×2 IMPLANT
GLOVE BIOGEL PI IND STRL 7.5 (GLOVE) ×2 IMPLANT
GOWN STRL REUS W/ TWL LRG LVL3 (GOWN DISPOSABLE) ×2 IMPLANT
GOWN STRL REUS W/TWL LRG LVL3 (GOWN DISPOSABLE) ×1
HOLDER FOLEY CATH W/STRAP (MISCELLANEOUS) ×2 IMPLANT
IMPL RTE SNAPCONE CX 1.0 IMPLANT
IMPLANT RTE SNAPCONE CX 1.0 ×1 IMPLANT
JET LAVAGE IRRISEPT WOUND (IRRIGATION / IRRIGATOR) ×2
KIT ACCESSORY AMS 700 PUMP (Urological Implant) IMPLANT
KIT BASIN OR (CUSTOM PROCEDURE TRAY) ×2 IMPLANT
KIT TURNOVER KIT A (KITS) ×2 IMPLANT
LAVAGE JET IRRISEPT WOUND (IRRIGATION / IRRIGATOR) IMPLANT
NEEDLE HYPO 22GX1.5 SAFETY (NEEDLE) ×2 IMPLANT
NS IRRIG 1000ML POUR BTL (IV SOLUTION) ×2 IMPLANT
PACK GENERAL/GYN (CUSTOM PROCEDURE TRAY) ×2 IMPLANT
PLUG CATH AND CAP STER (CATHETERS) ×2 IMPLANT
PUMP PENILE AMS 700CX MS 12X21 (Erectile Restoration) IMPLANT
RESERVOIR FLAT IZ 100ML (Miscellaneous) IMPLANT
RETRACTOR DEEP SCROTAL PENILE (MISCELLANEOUS) IMPLANT
RETRACTOR WILSON SYSTEM (INSTRUMENTS) IMPLANT
SET COLLECT BLD 21X.75 12 PB G (NEEDLE) IMPLANT
SUPPORT SCROTAL LG STRP (MISCELLANEOUS) ×2 IMPLANT
SURGILUBE 2OZ TUBE FLIPTOP (MISCELLANEOUS) IMPLANT
SUT ETHILON 3 0 PS 1 (SUTURE) IMPLANT
SUT MNCRL AB 4-0 PS2 18 (SUTURE) ×2 IMPLANT
SUT VIC AB 2-0 UR6 27 (SUTURE) ×8 IMPLANT
SUT VIC AB 3-0 SH 27 (SUTURE) ×2
SUT VIC AB 3-0 SH 27X BRD (SUTURE) ×4 IMPLANT
SYR 10ML LL (SYRINGE) ×4 IMPLANT
SYR 50ML LL SCALE MARK (SYRINGE) ×6 IMPLANT
TOWEL OR 17X26 10 PK STRL BLUE (TOWEL DISPOSABLE) ×4 IMPLANT
WATER STERILE IRR 500ML POUR (IV SOLUTION) ×2 IMPLANT
YANKAUER SUCT BULB TIP 10FT TU (MISCELLANEOUS) IMPLANT

## 2022-03-08 NOTE — Op Note (Signed)
PATIENT:  Carl [name]  PRE-OPERATIVE DIAGNOSIS:  Organic erectile dysfunction  POST-OPERATIVE DIAGNOSIS:  Same  PROCEDURE:  Procedure(s): 3 piece inflatable penile prosthesis (BS/AMS)  SURGEON:  Donald Pore MD  ASST: H[place] Rao, MD  INDICATION: He has had long-standing organic erectile dysfunction and refractory to other modes of treatment. He has elected to proceed with prosthesis implantation.  ANESTHESIA:  General  EBL:  Minimal  Device: 3 piece AMS CX 700: 100 cc conceal reservoir, 21 cm cylinders and 1 cm rear-tip extenders on right and left sides  LOCAL MEDICATIONS USED:  None  SPECIMEN: None  DISPOSITION OF SPECIMEN:  N/A  Description of procedure: The patient was taken to the major operating room, placed on the table and administered general anesthesia in the supine position. His genitalia was then prepped with chlorhexidine x 2. He was draped in the usual sterile fashion, and I used [place] on the field. An official timeout was then performed.  A 14 French coude catheter was then placed in the bladder and the bladder was drained and the catheter was plugged. A midline penoscrotal incision was then made and the dissection was carried down to the corpora and urethra. The lonestar retractor was positioned so as to have excellent exposure. 2-0 Vicryl sutures were then placed proximally in each corpus cavernosum to serve as stay sutures. An incision was then made in the corpus cavernosum first on the left-hand side with the bovie. [place] were used to gently dilate the opening. I then dilated the corpus cavernosum with the a 11 Fr brooks dilator distally and proximally. Field goal post tests were performed and there was no evidence of perforations or crossover. I then irrigated the corpus cavernosum with antibiotic solution and measured the distance proximally and distally from the stay suture and was found to be 10 and 11.5 cm, respectively.I then turned my attention to the  contralateral corpus cavernosum and placed my stay sutures, made my corporotomy and dilated the corpus cavernosum in an identical fashion. This was measured and also was found to be 10.5 cm proximally and 11.5 distally. It was irrigated with anastomotic solution as was the scrotum. I then chose an 21 cm cylinder set with 1 cm rear-tip extenders and these were prepped while I prepared the site for reservoir placement.  I then digitally probed into the Left external inguinal ring. My finger was used to poke through the posterior wall of the ring. I used my finger to ensure I was in the appropriate space, and to clear room for the reservoir. I irrigated the space with anastomotic solution and then placed the reservoir in this location. I then filled the reservoir with 98 cc of sterile saline, and checked to confirm proper position. There was minimal backpressure with the reservoir max-filled.  Attention was redirected to the corporotomies where the cylinders were then placed by first fixing the suture to the distal aspect of the right cylinder to a straight needle. This was then loaded on the [facility] inserter and passed through the corporotomy and distally. I then advanced the straight needle with the [place] inserter out through the glans and this was grasped with a hemostat and pulled through the glans and the suture was secured with a hemostat. I then performed an identical maneuver on the contralateral side. After this was performed I irrigated both corpus cavernosum; there was no evidence of urethral perforation. I inserted the distal portion of the cylinder through the corporotomies and pulled this to the end of  the corpora with the suture. The proximal aspect with the rear-tip extender was then passed through the corporotomy and into the seated position on each side. I then connected reservoir tubing to a syringe filled with sterile saline and inflated the device. I noted a good straight erection with both  cylinders equidistant under the glans and no buckling of the cylinders. I therefore deflated the device and closed the corporotomies with used my previously placed stay sutures.   I then grasped the scrotal skin in the midline with a babcock, and used a hemostat to dissect down to the dependent-most portion of the scrotum. The nasal speculum was inserted into this space, and facilitated placement of the pump. The cylinder was then connected to the pump after excising the excess tubing with appropriate shodded hemostats in place and then I used the supplied connectors to make the connection. I then again cycled the device with the pump and it cycled properly. I deflated the device and pumped it up about three quarters of the way to aid with hemostasis. I irrigated the wound one last time with antibiotic irrigation and then closed the deep scrotal tissue over the tubing and pump with running 3-0 monocryl suture. I placed a 10 Fr JP drain over the corporotomies. A second layer was then closed over this first layer with running 3- 0 monocryl, and running skin suture w/ 4-0 monocryl performed. Incision dressed with dermabond.  A mummy wrap was applied. The catheter was connected to closed system drainage, and drain connected to suction bulb and the patient was awakened and taken recovery room in stable and satisfactory condition. He tolerated the procedure well and there were no intraoperative complications. Needle sponge and instrument counts were correct at the end of the operation.

## 2022-03-08 NOTE — Anesthesia Procedure Notes (Signed)
Procedure Name: Intubation Date/Time: 03/08/2022 11:32 AM  Performed by: Maxwell Caul, CRNAPre-anesthesia Checklist: Patient identified, Emergency Drugs available, Suction available and Patient being monitored Patient Re-evaluated:Patient Re-evaluated prior to induction Oxygen Delivery Method: Circle system utilized Preoxygenation: Pre-oxygenation with 100% oxygen Induction Type: IV induction Ventilation: Mask ventilation without difficulty Laryngoscope Size: Mac and 4 Grade View: Grade II Tube type: Oral Tube size: 7.5 mm Number of attempts: 1 Airway Equipment and Method: Stylet Placement Confirmation: ETT inserted through vocal cords under direct vision, positive ETCO2 and breath sounds checked- equal and bilateral Secured at: 21 cm Tube secured with: Tape Dental Injury: Teeth and Oropharynx as per pre-operative assessment

## 2022-03-08 NOTE — H&P (Signed)
H&P  History of Present Illness: Carl [name] is a 77 y.o. year old M who presents today for implantation of an inflatable penile prosthesis  No acute complaints today  Past Medical History:  Diagnosis Date   Anxiety    takes Xanax daily as needed   Arthritis    CAD (coronary artery disease)    s/p CABG   Cataract    removed both eyes   Chronic diastolic CHF (congestive heart failure) (HCC)    Depression    takes Celexa daily   Diverticulitis    Diverticulosis    Enlarged prostate    sightly   Esophagitis    Gastric ulcer    GERD (gastroesophageal reflux disease)    takes Protonix daily   Hx of adenomatous colonic polyps    benign   Hyperlipidemia    takes Zocor daily   Hypertension    Hypothyroidism    Insomnia    takes Ambien nightly   Joint pain    Joint swelling    OSA (obstructive sleep apnea)    Paroxysmal atrial fibrillation (HCC)    On Eliquis   Pre-diabetes     Past Surgical History:  Procedure Laterality Date   A-FLUTTER ABLATION N/A 01/11/2019   Procedure: A-FLUTTER ABLATION;  Surgeon: Evans Lance, MD;  Location: [facility];  Service: Cardiovascular;  Laterality: N/A;   CARDIAC CATHETERIZATION     CARDIOVERSION N/A 11/20/2018   Procedure: CARDIOVERSION;  Surgeon: Troy Sine, MD;  Location: [facility];  Service: Cardiovascular;  Laterality: N/A;   cartliage removed from nose  17yr ago   cataract surgery Bilateral    CERVICAL SPINE SURGERY     COLONOSCOPY     CORONARY ARTERY BYPASS GRAFT N/A 11/16/2017   Procedure: CORONARY ARTERY BYPASS GRAFTING (CABG) x4. LEFT ENDOSCOPIC SAPHENOUS VEIN HARVEST AND MAMMARY ARTERY TAKE DOWN. LIMA TO LAD, SVG TO PDA, SVG TO DISTAL CIRC & OMI.;  Surgeon: GGrace Isaac MD;  [facility]  Service: Open Heart Surgery;  Laterality: N/A;   EYE SURGERY     FOREIGN BODY REMOVAL ESOPHAGEAL     KNEE SURGERY Left    couple of times   LEFT HEART CATH AND CORONARY ANGIOGRAPHY N/A 11/15/2017    Procedure: LEFT HEART CATH AND CORONARY ANGIOGRAPHY;  Surgeon: KTroy Sine MD;  [facility];  Service: Cardiovascular;  Laterality: N/A;   NASAL SINUS SURGERY     POLYPECTOMY     TEE WITHOUT CARDIOVERSION N/A 11/16/2017   Procedure: TRANSESOPHAGEAL ECHOCARDIOGRAM (TEE);  Surgeon: GGrace Isaac MD;  [facility]  Service: Open Heart Surgery;  Laterality: N/A;   TONSILLECTOMY     TOTAL HIP ARTHROPLASTY Right 04/17/2020   Procedure: RIGHT TOTAL HIP ARTHROPLASTY ANTERIOR APPROACH;  Surgeon: BMcarthur Rossetti MD;  [facility];  Service: Orthopedics;  Laterality: Right;   TOTAL KNEE ARTHROPLASTY Left 06/17/2014   Procedure: TOTAL KNEE ARTHROPLASTY;  Surgeon: PGarald Balding MD;  [facility]  Service: Orthopedics;  Laterality: Left;    Home Medications:  Current Meds  Medication Sig   acyclovir (ZOVIRAX) 400 MG tablet Take 400 mg by mouth 2 (two) times daily.   aspirin EC 81 MG tablet Take 1 tablet (81 mg total) by mouth daily.   citalopram (CELEXA) 20 MG tablet Take 20 mg by mouth daily.   ELIQUIS 5 MG TABS tablet TAKE 1 TABLET(5 MG) BY MOUTH TWICE DAILY   ezetimibe (ZETIA) 10  MG tablet Take 1 tablet (10 mg total) by mouth daily.   ferrous sulfate 325 (65 FE) MG tablet Take 325 mg by mouth daily.   furosemide (LASIX) 40 MG tablet Take 1 tablet (40 mg total) by mouth 2 (two) times daily. Schedule an appointment for further refills (Patient taking differently: Take 80 mg by mouth 2 (two) times daily.)   levothyroxine (SYNTHROID) 150 MCG tablet TAKE 1 TABLET(150 MCG) BY MOUTH DAILY   lisinopril (ZESTRIL) 10 MG tablet Take 1 tablet (10 mg total) by mouth daily.   metFORMIN (GLUCOPHAGE-XR) 500 MG 24 hr tablet Take 500 mg by mouth in the morning and at bedtime.   metoprolol tartrate (LOPRESSOR) 50 MG tablet TAKE 1 TABLET(50 MG) BY MOUTH TWICE DAILY   Misc Natural Products (JOINT HEALTH PO) Take 1 tablet by mouth daily.   Multiple Vitamins-Minerals  (PRESERVISION AREDS 2 PO) Take 1 tablet by mouth 2 (two) times daily.   nitroGLYCERIN (NITROSTAT) 0.4 MG SL tablet DISSOLVE 1 TABLET UNDER THE TONGUE EVERY 5 MINUTES AS NEEDED FOR CHEST PAIN   OZEMPIC, 1 MG/DOSE, 4 MG/3ML SOPN Inject 1 mg into the skin every Tuesday.   pantoprazole (PROTONIX) 20 MG tablet TAKE 2 TABLETS(40 MG) BY MOUTH DAILY (Patient taking differently: Take 40 mg by mouth at bedtime.)   Potassium Chloride ER 20 MEQ TBCR Take 20 mEq by mouth daily.   rosuvastatin (CRESTOR) 40 MG tablet TAKE 1 TABLET(40 MG) BY MOUTH DAILY   timolol (TIMOPTIC) 0.5 % ophthalmic solution Place 1 drop into the right eye 2 (two) times daily.   zolpidem (AMBIEN) 10 MG tablet Take 10 mg by mouth at bedtime.    Allergies: No Known Allergies  Family History  Problem Relation Age of Onset   Colon cancer Paternal Grandfather        questionable   Colon polyps Neg Hx    Esophageal cancer Neg Hx    Rectal cancer Neg Hx    Stomach cancer Neg Hx     Social History:  reports that he has never smoked. He has never used smokeless tobacco. He reports current alcohol use of about 10.0 standard drinks of alcohol per week. He reports that he does not use drugs.  ROS: A complete review of systems was performed.  All systems are negative except for pertinent findings as noted.  Physical Exam:  Vital signs in last 24 hours: Temp:  [98.7 F (37.1 C)] 98.7 F (37.1 C) (01/30 0838) Pulse Rate:  [53] 53 (01/30 0838) Resp:  [17] 17 (01/30 0838) BP: (116)/(69) 116/69 (01/30 0838) SpO2:  [95 %] 95 % (01/30 1610) Constitutional:  Alert and oriented, No acute distress Cardiovascular: Regular rate and rhythm Respiratory: Normal respiratory effort, Lungs clear bilaterally GI: Abdomen is soft, nontender, nondistended, no abdominal masses Lymphatic: No lymphadenopathy Neurologic: Grossly intact, no focal deficits Psychiatric: Normal mood and affect   Laboratory Data:  No results for input(s): "WBC", "HGB",  "HCT", "PLT" in the last 72 hours.  No results for input(s): "NA", "K", "CL", "GLUCOSE", "BUN", "CALCIUM", "CREATININE" in the last 72 hours.  Invalid input(s): "CO3"   Results for orders placed or performed during the hospital encounter of 03/08/22 (from the past 24 hour(s))  Glucose, capillary     Status: Abnormal   Collection Time: 03/08/22  8:44 AM  Result Value Ref Range   Glucose-Capillary 104 (H) 70 - 99 mg/dL   No results found for this or any previous visit (from the past 240 hour(s)).  Renal Function: No results for input(s): "CREATININE" in the last 168 hours. Estimated Creatinine Clearance: 64.7 mL/min (by C-G formula based on SCr of 1.06 mg/dL).  Radiologic Imaging: No results found.  Assessment:  Carl [name] is a 77 y.o. year old with ED refractory to medical management  Plan:  To OR for penile implant. Procedure and risks reviewed (bleeding, infection, implant infection, implant malfunction, malplacement, damage to adjacent structures, pain, loss of erection length)  Donald Pore, MD 03/08/2022, 10:35 AM  [facility] Pager: [phone]

## 2022-03-08 NOTE — Anesthesia Postprocedure Evaluation (Signed)
Anesthesia Post Note  Patient: Carl [name]  Procedure(s) Performed: INSERTION OF INFLATABLE PENILE IMPLANT (Penis)     Patient location during evaluation: PACU Anesthesia Type: General Level of consciousness: awake Pain management: pain level controlled Vital Signs Assessment: post-procedure vital signs reviewed and stable Respiratory status: spontaneous breathing, nonlabored ventilation and respiratory function stable Cardiovascular status: blood pressure returned to baseline and stable Postop Assessment: no apparent nausea or vomiting Anesthetic complications: no   No notable events documented.  Last Vitals:  Vitals:   03/08/22 1400 03/08/22 1500  BP: (!) 158/77 (!) 161/76  Pulse: 60 60  Resp: 11 13  Temp: 36.7 C   SpO2: 95% 92%    Last Pain:  Vitals:   03/08/22 1500  TempSrc:   PainSc: 0-No pain                 Nilda Simmer

## 2022-03-08 NOTE — Discharge Instructions (Signed)
Penile prosthesis postoperative instructions  Wound:  In most cases your incision will have absorbable sutures that will dissolve within the first 10-20 days. Some will fall out even earlier. Expect some redness as the sutures dissolved but this should occur only around the sutures. If there is generalized redness, especially with increasing pain or swelling, let us know. The scrotum and penis will very likely get "black and blue" as the blood in the tissues spread. Sometimes the whole scrotum will turn colors. The black and blue is followed by a yellow and brown color. In time, all the discoloration will go away. In some cases some firm swelling in the area of the testicle and pump may persist for up to 4-6 weeks after the surgery and is considered normal in most cases.  Drain:   You may be discharged home with a drain in place. If so, you will be taught how to empty it and should keep track of the output. Additionally, you should call the office to arrange for an appointment to have it removed after a few days.   Diet:  You may return to your normal diet within 24 hours following your surgery. You may note some mild nausea and possibly vomiting the first 6-8 hours following surgery. This is usually due to the side effects of anesthesia, and will disappear quite soon. I would suggest clear liquids and a very light meal the first evening following your surgery.  Activity:  Your physical activity should be restricted the first 48 hours. During that time you should remain relatively inactive, moving about only when necessary. During the first 3 weeks following surgery you should avoid lifting any heavy objects (anything greater than 15 pounds), and avoid strenuous exercise. If you work, ask us specifically about your restrictions, both for work and home. We will write a note to your employer if needed.  Avoid using your penis until your follow up visit with Dr Reyce Lubeck, which will typically be around  3-4 weeks following the surgery. Most people are able to start cycling their device after that appointment, and can have intercourse soon thereafter.   You should plan to wear a tight pair of jockey shorts or an athletic supporter for the first 4-5 days, even to sleep. This will keep the scrotum immobilized to some degree and keep the swelling down.The position of your penis will determine what is most comfortable but I strongly urge you to keep the penis in the "up" position (toward your head). You should continue to tuck "up" your penis when possible for the first 3 months following surgery.  Ice packs should be placed on and off over the scrotum for the first 48 hours. Frozen peas or corn in a ZipLock bag can be frozen, used and re-frozen. Fifteen minutes on and 15 minutes off is a reasonable schedule. The ice is a good pain reliever and keeps the swelling down.  Hygiene:  You may shower 48 hours after your surgery. Tub bathing should be restricted until the wound is completely healed, typically around 2-3 weeks.  Medication:  You will be sent home with some type of pain medication. In many cases you will be sent home with a strong anti-inflammatory medication (Celebrex, Meloxicam) and a narcotic pain pill (hydrocodone or oxycodone). You can also supplement these medications with tylenol (acetaminophen). If the pain medication you are sent home with does not control the pain, please notify the office Problems you should report to us:  Fever of 101.0 degrees   Fahrenheit or greater. Moderate or severe swelling under the skin incision or involving the scrotum. Drug reaction such as hives, a rash, nausea or vomiting.  

## 2022-03-08 NOTE — Transfer of Care (Signed)
Immediate Anesthesia Transfer of Care Note  Patient: Carl [name]  Procedure(s) Performed: INSERTION OF INFLATABLE PENILE IMPLANT (Penis)  Patient Location: PACU  Anesthesia Type:General  Level of Consciousness: awake, alert , and patient cooperative  Airway & Oxygen Therapy: Patient Spontanous Breathing and Patient connected to face mask oxygen  Post-op Assessment: Report given to RN and Post -op Vital signs reviewed and stable  Post vital signs: Reviewed and stable  Last Vitals:  Vitals Value Taken Time  BP 161/96 03/08/22 1323  Temp    Pulse 62 03/08/22 1325  Resp 21 03/08/22 1325  SpO2 99 % 03/08/22 1325  Vitals shown include unvalidated device data.  Last Pain:  Vitals:   03/08/22 0853  TempSrc:   PainSc: 0-No pain         Complications: No notable events documented.

## 2022-03-09 ENCOUNTER — Encounter (HOSPITAL_COMMUNITY): Payer: Self-pay | Admitting: Urology

## 2022-03-09 ENCOUNTER — Other Ambulatory Visit: Payer: Self-pay | Admitting: *Deleted

## 2022-03-09 DIAGNOSIS — N528 Other male erectile dysfunction: Secondary | ICD-10-CM | POA: Diagnosis not present

## 2022-03-09 LAB — GLUCOSE, CAPILLARY: Glucose-Capillary: 149 mg/dL — ABNORMAL HIGH (ref 70–99)

## 2022-03-09 MED ORDER — FUROSEMIDE 40 MG PO TABS: 40.0000 mg | ORAL_TABLET | Freq: Two times a day (BID) | ORAL | 1 refills | Status: DC

## 2022-03-09 MED ORDER — ORAL CARE MOUTH RINSE: 15.0000 mL | OROMUCOSAL | Status: DC | PRN

## 2022-03-09 NOTE — TOC Transition Note (Signed)
Transition of Care [facility]) - CM/SW Discharge Note   Patient Details  Name: Carl [name] MRN: [id number] Date of Birth: 17-Aug-1945  [facility]) CM/SW Contact:  Leeroy Cha, RN Phone Number: 03/09/2022, 8:22 AM   Clinical Narrative:    Patient dcd to return home with self care.  No toc needs present.   Final next level of care: Home/Self Care Barriers to Discharge: Barriers Resolved   Patient Goals and CMS Choice CMS Medicare.gov Compare Post Acute Care list provided to:: Patient    Discharge Placement                         Discharge Plan and Services Additional resources added to the After Visit Summary for     Discharge Planning Services: CM Consult                                 Social Determinants of Health (SDOH) Interventions SDOH Screenings   Food Insecurity: No Food Insecurity (10/29/2020)  Housing: Low Risk  (10/29/2020)  Transportation Needs: No Transportation Needs (10/29/2020)  Alcohol Screen: Low Risk  (10/29/2020)  Depression (PHQ2-9): Low Risk  (09/05/2018)  Financial Resource Strain: Low Risk  (10/29/2020)  Physical Activity: Sufficiently Active (10/29/2020)  Stress: No Stress Concern Present (02/15/2018)  Tobacco Use: Low Risk  (03/08/2022)     Readmission Risk Interventions   No data to display

## 2022-03-09 NOTE — Plan of Care (Signed)

## 2022-03-09 NOTE — TOC Initial Note (Signed)
Transition of Care [facility]) - Initial/Assessment Note    Patient Details  Name: Carl [name] MRN: [id number] Date of Birth: 01/09/1946  Transition of Care [facility]) CM/SW Contact:    Leeroy Cha, RN Phone Number: 03/09/2022, 7:36 AM  Clinical Narrative:                  Transition of Care [facility]) Screening Note   Patient Details  Name: Carl [name] Date of Birth: 06-04-1945   Transition of Care [facility]) CM/SW Contact:    Leeroy Cha, RN Phone Number: 03/09/2022, 7:36 AM    Transition of Care Department [facility]) has reviewed patient and no TOC needs have been identified at this time. We will continue to monitor patient advancement through interdisciplinary progression rounds. If new patient transition needs arise, please place a TOC consult.    Expected Discharge Plan: Home/Self Care Barriers to Discharge: Continued Medical Work up   Patient Goals and CMS Choice Patient states their goals for this hospitalization and ongoing recovery are:: to go home CMS Medicare.gov Compare Post Acute Care list provided to:: Patient        Expected Discharge Plan and Services   Discharge Planning Services: CM Consult   Living arrangements for the past 2 months: [place]                                      Prior Living Arrangements/Services Living arrangements for the past 2 months: Single Family Home Lives with:: Self Patient language and need for interpreter reviewed:: Yes Do you feel safe going back to the place where you live?: Yes            Criminal Activity/Legal Involvement Pertinent to Current Situation/Hospitalization: No - Comment as needed  Activities of Daily Living      Permission Sought/Granted                  Emotional Assessment Appearance:: Appears stated age Attitude/Demeanor/Rapport: Engaged Affect (typically observed): Calm Orientation: : Oriented to Self, Oriented to Place, Oriented to  Time, Oriented to Situation Alcohol  / Substance Use: Not Applicable Psych Involvement: No (comment)  Admission diagnosis:  S/P insertion of penile implant [Z96.0] Patient Active Problem List   Diagnosis Date Noted   S/P insertion of penile implant 03/08/2022   Acute on chronic diastolic CHF (congestive heart failure) ([name]) 10/27/2020   CAD (coronary artery disease) 10/27/2020   Hypothyroidism 10/27/2020   Paroxysmal atrial fibrillation ([place]) 10/27/2020   Benign hypertension 07/22/2020   Drug-induced hypothyroidism 07/22/2020   Glaucoma 07/22/2020   Impaired fasting glucose 07/22/2020   Insomnia 07/22/2020   Macrocytosis 07/22/2020   Microscopic hematuria 07/22/2020   OSA (obstructive sleep apnea) 07/22/2020   Personal history of transient ischemic attack (TIA), and cerebral infarction without residual deficits 07/22/2020   Presence of aortocoronary bypass graft 07/22/2020   Transient ischemic attack 07/22/2020   Unspecified abnormal finding in specimens from other organs, systems and tissues 07/22/2020   Status post total replacement of right hip 04/17/2020   Avascular necrosis of bone of right hip ([place]) 04/07/2020   Pain in right hip 02/11/2020   Low back pain 02/11/2020   Typical atrial flutter ([place])    Impacted cerumen of both ears 07/16/2018   TSH elevation 11/21/2017   Atrial fibrillation with rapid ventricular response (HCC)    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/16/2017   Atherosclerotic  heart disease of native coronary artery without angina pectoris 11/15/2017   Abnormal cardiac CT angiography    RLQ abdominal pain 07/07/2015   Primary osteoarthritis of left knee 06/17/2014   S/P total knee replacement using cement 06/17/2014   Preoperative cardiovascular examination 04/19/2014   Mixed hyperlipidemia 04/19/2014   AP (abdominal pain) 01/16/2014   History of diverticulitis 01/16/2014   GERD 01/14/2009   ULCER-GASTRIC 01/14/2009   GASTRITIS 11/14/2008   DIVERTICULITIS OF COLON 11/13/2008   BLOOD IN STOOL 11/13/2008    ABDOMINAL PAIN 11/13/2008   PERSONAL HISTORY OF COLONIC POLYPS 11/13/2008   PCP:  [facility], [place]:   [facility] [address] [id number] [address] [place], [place] [address] Phone: [phone] Fax: [phone]  [facility] [phone] - [address] [place] [address] Phone: [phone] Fax: [place], [place] [address] [place] [address] Phone: [phone] Fax: [place], [place] [address] Phone: [phone] Fax: [phone]     Social Determinants of Health (SDOH) Social History: [place]: No Food Insecurity (10/29/2020)  Housing: Low Risk  (10/29/2020)  Transportation Needs: No Transportation Needs (10/29/2020)  Alcohol Screen: Low Risk  (10/29/2020)  Depression (PHQ2-9): Low Risk  (09/05/2018)  Financial Resource Strain: Low Risk  (10/29/2020)  Physical Activity: Sufficiently Active (10/29/2020)  Stress: No Stress Concern Present (02/15/2018)  Tobacco Use: Low Risk  (03/08/2022)   SDOH Interventions:     Readmission Risk Interventions   No data to display

## 2022-03-09 NOTE — Discharge Summary (Signed)
[facility] Discharge Summary  Admit date: 03/08/2022  Discharge date and time: 03/09/22   Discharge to: Home  Discharge Service: Urology  Discharge Attending Physician:  Dr. Cain Sieve   Discharge  Diagnoses: S/P insertion of penile implant  Secondary Diagnosis: Principal Problem:   S/P insertion of penile implant   OR Procedures: Procedure(s): INSERTION OF INFLATABLE PENILE IMPLANT 03/08/2022   Ancillary Procedures: None   Discharge Day Services: The patient was seen and examined by the Urology team both in the morning and immediately prior to discharge.  Vital signs and laboratory values were stable and within normal limits.  The physical exam was benign and unchanged and all surgical wounds were examined.  Discharge instructions were explained and all questions answered.  Subjective  No acute events overnight. Pain Controlled. No fever or chills.  Objective Patient Vitals for the past 8 hrs:  BP Temp Temp src Pulse Resp SpO2  03/09/22 0602 115/71 98 F (36.7 C) Oral (!) 59 16 95 %  03/09/22 0117 (!) 120/57 97.7 F (36.5 C) Oral 60 20 96 %   No intake/output data recorded.  General Appearance:        No acute distress Lungs:                       Normal work of breathing on room air Heart:                                Regular rate and rhythm Abdomen:                         Soft, non-tender, non-distended Extremities:                      Warm and well perfused GU:         Mild ecchymosis of scrotum and base of penis. No tenderness or swelling, no erythema. Drain minimal output   Hospital Course:  The patient underwent placement of penile prosthesis on 03/08/2022.  The patient tolerated the procedure well, was extubated in the OR, and afterwards was taken to the PACU for routine post-surgical care. When stable the patient was transferred to the floor.   The patient did well postoperatively.  The patient's diet was slowly advanced and at the time of discharge was  tolerating a regular diet.  The patient was discharged home 1 Day Post-Op, at which point was tolerating a regular solid diet, was able to void spontaneously, have adequate pain control with P.O. pain medication, and could ambulate without difficulty. The patient will follow up with [place] for post op check. He will remove his drain at home on 03/11/22.1  Condition at Discharge: Improved  Discharge Medications:  Allergies as of 03/09/2022   No Known Allergies      Medication List     STOP taking these medications    sildenafil 100 MG tablet Commonly known as: VIAGRA       TAKE these medications    acetaminophen 500 MG tablet Commonly known as: TYLENOL Take 2 tablets (1,000 mg total) by mouth every 6 (six) hours. What changed:  when to take this reasons to take this   acyclovir 400 MG tablet Commonly known as: ZOVIRAX Take 400 mg by mouth 2 (two) times daily.   aspirin EC 81 MG tablet Take 1 tablet (81 mg total) by mouth daily.  celecoxib 200 MG capsule Commonly known as: CELEBREX Take 1 capsule (200 mg total) by mouth 2 (two) times daily.   citalopram 20 MG tablet Commonly known as: CELEXA Take 20 mg by mouth daily.   Eliquis 5 MG Tabs tablet Generic drug: apixaban TAKE 1 TABLET(5 MG) BY MOUTH TWICE DAILY   ezetimibe 10 MG tablet Commonly known as: ZETIA Take 1 tablet (10 mg total) by mouth daily.   ferrous sulfate 325 (65 FE) MG tablet Take 325 mg by mouth daily.   furosemide 40 MG tablet Commonly known as: LASIX Take 1 tablet (40 mg total) by mouth 2 (two) times daily. Schedule an appointment for further refills What changed:  how much to take additional instructions   icosapent Ethyl 1 g capsule Commonly known as: VASCEPA TAKE 2 CAPSULES(2 GRAMS) BY MOUTH TWICE DAILY   JOINT HEALTH PO Take 1 tablet by mouth daily.   levothyroxine 150 MCG tablet Commonly known as: SYNTHROID TAKE 1 TABLET(150 MCG) BY MOUTH DAILY   lisinopril 10 MG tablet Commonly  known as: ZESTRIL Take 1 tablet (10 mg total) by mouth daily.   metFORMIN 500 MG 24 hr tablet Commonly known as: GLUCOPHAGE-XR Take 500 mg by mouth in the morning and at bedtime.   metoprolol tartrate 50 MG tablet Commonly known as: LOPRESSOR TAKE 1 TABLET(50 MG) BY MOUTH TWICE DAILY   nitroGLYCERIN 0.4 MG SL tablet Commonly known as: NITROSTAT DISSOLVE 1 TABLET UNDER THE TONGUE EVERY 5 MINUTES AS NEEDED FOR CHEST PAIN   oxyCODONE 5 MG immediate release tablet Commonly known as: Oxy IR/ROXICODONE Take 1 tablet (5 mg total) by mouth every 6 (six) hours as needed for severe pain.   Ozempic (1 MG/DOSE) 4 MG/3ML Sopn Generic drug: Semaglutide (1 MG/DOSE) Inject 1 mg into the skin every Tuesday.   pantoprazole 20 MG tablet Commonly known as: PROTONIX TAKE 2 TABLETS(40 MG) BY MOUTH DAILY What changed: See the new instructions.   potassium chloride 20 MEQ packet Commonly known as: KLOR-CON Take 20 mEq by mouth daily. What changed: Another medication with the same name was removed. Continue taking this medication, and follow the directions you see here.   PRESERVISION AREDS 2 PO Take 1 tablet by mouth 2 (two) times daily.   rosuvastatin 40 MG tablet Commonly known as: CRESTOR TAKE 1 TABLET(40 MG) BY MOUTH DAILY   sulfamethoxazole-trimethoprim 800-160 MG tablet Commonly known as: BACTRIM DS Take 1 tablet by mouth 2 (two) times daily.   timolol 0.5 % ophthalmic solution Commonly known as: TIMOPTIC Place 1 drop into the right eye 2 (two) times daily.   zolpidem 10 MG tablet Commonly known as: AMBIEN Take 10 mg by mouth at bedtime.

## 2022-03-10 ENCOUNTER — Telehealth: Payer: Self-pay

## 2022-03-10 NOTE — Telephone Encounter (Signed)
Was asked by Carl [name] sleep coordinator to call patient regarding medication; that he had a question. Spoke with pt who wanted to know if any of his medications were to be cut in half. Informed pt that none of his medications were to be cut in half. Last OV January 2023. Made appointment with Dr. Claiborne [name] for 2/12.

## 2022-03-16 DIAGNOSIS — M65332 Trigger finger, left middle finger: Secondary | ICD-10-CM | POA: Diagnosis not present

## 2022-03-17 DIAGNOSIS — H43811 Vitreous degeneration, right eye: Secondary | ICD-10-CM | POA: Diagnosis not present

## 2022-03-17 DIAGNOSIS — H40051 Ocular hypertension, right eye: Secondary | ICD-10-CM | POA: Diagnosis not present

## 2022-03-17 DIAGNOSIS — T8579XS Infection and inflammatory reaction due to other internal prosthetic devices, implants and grafts, sequela: Secondary | ICD-10-CM | POA: Diagnosis not present

## 2022-03-21 ENCOUNTER — Ambulatory Visit: Payer: PPO | Attending: Cardiovascular Disease | Admitting: Cardiovascular Disease

## 2022-03-21 ENCOUNTER — Encounter: Payer: Self-pay | Admitting: Cardiovascular Disease

## 2022-03-21 DIAGNOSIS — I2510 Atherosclerotic heart disease of native coronary artery without angina pectoris: Secondary | ICD-10-CM

## 2022-03-21 DIAGNOSIS — Z9889 Other specified postprocedural states: Secondary | ICD-10-CM | POA: Diagnosis not present

## 2022-03-21 DIAGNOSIS — E039 Hypothyroidism, unspecified: Secondary | ICD-10-CM | POA: Diagnosis not present

## 2022-03-21 DIAGNOSIS — I480 Paroxysmal atrial fibrillation: Secondary | ICD-10-CM | POA: Diagnosis not present

## 2022-03-21 DIAGNOSIS — Z8679 Personal history of other diseases of the circulatory system: Secondary | ICD-10-CM | POA: Diagnosis not present

## 2022-03-21 DIAGNOSIS — Z951 Presence of aortocoronary bypass graft: Secondary | ICD-10-CM | POA: Diagnosis not present

## 2022-03-21 DIAGNOSIS — I5032 Chronic diastolic (congestive) heart failure: Secondary | ICD-10-CM | POA: Diagnosis not present

## 2022-03-21 DIAGNOSIS — G4733 Obstructive sleep apnea (adult) (pediatric): Secondary | ICD-10-CM

## 2022-03-21 DIAGNOSIS — E782 Mixed hyperlipidemia: Secondary | ICD-10-CM | POA: Diagnosis not present

## 2022-03-21 DIAGNOSIS — E118 Type 2 diabetes mellitus with unspecified complications: Secondary | ICD-10-CM | POA: Diagnosis not present

## 2022-03-21 MED ORDER — METOPROLOL TARTRATE 25 MG PO TABS: 25.0000 mg | ORAL_TABLET | Freq: Two times a day (BID) | ORAL | 3 refills | Status: DC

## 2022-03-21 MED ORDER — FUROSEMIDE 40 MG PO TABS: ORAL_TABLET | ORAL | 3 refills | Status: DC

## 2022-03-21 NOTE — Patient Instructions (Signed)
Medication Instructions:  Your physician has recommended you make the following change in your medication:  DECREASE: Metoprolol 18m twice daily CHANGE: Lasix 453min the AM and 2055m0.5 tablet) in the PM  *If you need a refill on your cardiac medications before your next appointment, please call your pharmacy*   Lab Work: Your physician recommends that you return in April to have the following labs drawn: CMET, CBC, TSH, and LIPID  If you have labs (blood work) drawn today and your tests are completely normal, you will receive your results only by: MyCPulciferf you have MyChart) OR A paper copy in the mail If you have any lab test that is abnormal or we need to change your treatment, we will call you to review the results.   Testing/Procedures: NONE   Follow-Up: [facility].  As part of our continuing mission to provide you with exceptional heart care, we have created designated Provider Care Teams.  These Care Teams include your primary Cardiologist (physician) and Advanced Practice Providers (APPs -  Physician Assistants and Nurse Practitioners) who all work together to provide you with the care you need, when you need it.  We recommend signing up for the patient portal called "MyChart".  Sign up information is provided on this After Visit Summary.  MyChart is used to connect with patients for Virtual Visits (Telemedicine).  Patients are able to view lab/test results, encounter notes, upcoming appointments, etc.  Non-urgent messages can be sent to your provider as well.   To learn more about what you can do with MyChart, go to [email or web address]  Your next appointment:   3 month(s)  Provider:   ThoShelva MajesticD

## 2022-03-21 NOTE — Progress Notes (Signed)
u

## 2022-03-21 NOTE — Progress Notes (Signed)
Cardiology Office Note    Date:  03/26/2022   ID:  Carl [name], DOB 01/09/1946, MRN [id number]  PCP:  Amelia Jo, [place]  Cardiologist:  Shelva Majestic, MD    20 month follow-up evaluation   History of Present Illness:  Carl [name] is a 77 y.o. male who presents to the office today for 20 month follow-up evaluation.    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  In 2009 he developed transient numbness of his right face which lasted for several minutes and at that time apparently underwent evaluation and had a normal MRI, MRA, and carotid duplex studies.  He was found to have only mild to moderate plaque in the proximal right internal carotid artery without stenosis.  I had seen him in March 2016 for preoperative cardiology clearance prior to undergoing left knee replacement surgery by Dr. Durward Fortes.  Preoperative ECG suggested possible junctional rhythm which was new from an ECG of 2012 which previously had only shown sinus bradycardia.  He was asymptomatic.  When I saw him, his ECG showed sinus rhythm at 61 bpm with normal intervals and on that ECG P waves were normal and upright inferiorly.  Davier has remained fairly active. In 2019  he had not been exercising as much as he had in the past still working out with a trainer and denied any associated chest pain or palpitations.  He in September 201 19 he began to notice more shortness of breath with walking up steps.  He was  at a friend's house and was told that he appeared to be more short of breath than previously.  I saw him for evaluation of his exertional shortness of breath on October 24, 2017.  At that time, I recommended that he undergo a 2D echo Doppler study and scheduled him for coronary CT angiography.  His echo Doppler study demonstrated hyperdynamic LV function with an EF of 65 to 70%.  Doppler parameters suggest grade 2 diastolic dysfunction and elevated  ventricular end-diastolic filling pressure.  He had mild MR, mild TR, and mild dilation of his left atrium.  Pulmonary pressures were normal.  CT coronary angiography was performed on November 09, 2017.  This was abnormal and demonstrated an elevated calcium score a 25 which is 78th percentile for age and sex.  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  There was greater than 75% calcific plaque in his proximal and mid diagonal 1 vessel.  The circumflex had 50% proximal plaque.  There was 50 to 75% mixed plaque in the OM 2 vessel less than 50% in the OM1 vessel.  His RCA had 50% calcified plaque proximally, 50 to 75% calcified plaque in the mid vessel.  There was mild aortic root dilatation at 4.1 cm.  His CT images were referred for [facility] analysis which were positive in the mid RCA 0.78, the mid LAD 0.78 and in the AV groove circumflex at 0.71.   I saw him in follow-up of the above studies and recommended cardiac catheterization.  Catheterization was done in November 15, 2017 which showed severe multivessel CAD with 85% ostial LAD stenosis, diffuse 75% mid LAD stenosis, total occlusion of the mid AV groove circumflex after the takeoff of the second obtuse marginal vessel with 50% diffuse stenosis in the  first large marginal branch, 80 and 90% stenoses in the second obtuse marginal vessel and extensive collateralization to the distal circumflex and third marginal via the LAD.  He had 1650% mid RCA stenoses and a dominant RCA.  Of note, he had probable high right radial artery takeoff with spasm/coarse stenosis above the elbow and there was retrograde filling of the brachial artery and the catheterization was transition to the femoral approach.  He underwent successful CABG revascularization surgery the following day by Dr. Roxy Horseman on November 16, 2017 with a LIMA graft to his LAD, SVG to first  marginal and distal circumflex, and SVG to the PDA of his RCA with left thigh and calf endoscopic vein harvest.  He developed atrial fibrillation with RVR on postop day 2 and was started on IV amiodarone.  He ultimately converted back to sinus rhythm but developed recurrent AF on postop day 4 on metoprolol and amiodarone and ultimately converted back to sinus rhythm.  He was started on Eliquis on day 5 for anticoagulation.  He was discharged on November 22, 2017.  Approximately 10 days later he started to develop left lower extremity swelling and redness with pain in the back of his calf.  He was evaluated by Ellwood Handler, PA-C at [facility] on December 04, 2017.  There was no sign of infection.  Venous duplex imaging did not reveal any DVT.  He saw Dr. Servando Snare for initial follow-up evaluation on December 14, 2017 at which time he was maintaining sinus rhythm.  He will be participating in cardiac rehab and his orientation is scheduled for February 15, 2018.  He denied any recurrent chest pain.  His breathing has improved with walking.  He went to the beach and was walking at least a mile per day.  He is sleeping better and snoring is less.    Since his November 2019 and prior to his February 2020 evaluation he was doing well and was  participating in cardiac rehabilitation and also had joined a gym. He was exercising regularly.  He denied chest pain, PND, or orthopnea.  Subsequent laboratory showed further TSH elevation.  His levothyroxine was increased to 75 mcg.  Amiodarone was discontinued due to LFT elevation and his dose of rosuvastatin was reduced to one half of the current dose.  I  saw him in February 2020 at that time he had not had follow-up laboratory.    He had had follow-up laboratory which continued to show hypothyroidism and his dose of levothyroxine has gradually been increased.  Recent laboratory done by Dr. Rex Kras several months ago continues to show a TSH was elevated and his levothyroxine dose was  increased from 100 to 125 mcg.  During the COVID-19 pandemic, he admits to weight gain.  He has not been exercising as much as he had in the past.  Although he felt well, when recently seen by Dr. Durward Fortes he was felt to potentially be more short of breath.  He was advised to see me back for follow-up evaluation.  I scheduled him to undergo repeat laboratory which was done on October 05, 2018.  Hemoglobin 15.4, hematocrit 44.2.  Lipid studies were significantly improved from 6 months previously with total cholesterol now 143, triglycerides 142, HDL 48, LDL 67.  Brain natruretic peptide was 131.  TSH had significantly improved but was still elevated at 15.9.  He had normal renal function with a BUN of 15 creatinine 0.96.  LFTs were significantly improved but minimally increased with an AST of  46 and ALT at 49.  He denied any chest pain or awareness of palpitations.  I saw him for reevaluation on October 08, 2018.  During that evaluation, I reviewed his laboratory and felt he was euvolemic.  His ECG showed normal sinus rhythm at 60 bpm with an isolated PVC.  His QTc interval was 434 ms.  With his continued TSH elevation, levothyroxine was further titrated to 150 mcg.  Subsequently, Jarrod has continued to feel well.  He denied any awareness of palpitations.  Last week, in October 29, 2018 when checking his Apple watch he saw report that his heart rate was increased in the upper 130s.  He was unaware of this irregularity.  Upon further questioning, he had to go out of town over the last several days and believes he may have missed taking his morning metoprolol dose on Friday and Saturday and he could not remember if he had taken it yesterday morning.  He was supposed to be taken metoprolol tartrate 25 mg twice a day, HCTZ on a as needed basis which he has not needed.  He continues to be on Eliquis 5 mg twice a day and is tolerated the levothyroxine 150 mcg dose.  He is now on the vascepa which was just renewed last  week 2 capsules twice a day, rosuvastatin 20 mg daily for mixed hyperlipidemia.  After receiving his phone call, I advised that he come to the office to check an EKG.  With his ECG showing atrial flutter at a rate of 130 he was added onto my schedule for further evaluation.  During that evaluation, he was asymptomatic and felt well.  His ECG showed atrial flutter with variable block at 130 bpm with nonspecific ST-T changes.  He was on Eliquis for anticoagulation.  I recommended titration of metoprolol tartrate to 50 mg twice a day.  Laboratory drawn that day revealed a TSH 5.95, magnesium 2.3, potassium 4.7, BUN 14 creatinine 0.9.   I saw him on November 05, 2018 for follow-up evaluation.  He was feeling well and continue to be unaware of his heart rate irregularity.  However according to his Apple Watch, his heart rate was running in the 115-120 range   He denied chest pain.  He denied tremors.  He was sleeping well and denied any awareness of sleep apnea.  During that evaluation we discussed gradually him for DC cardioversion but this would require COVID-19 testing with quarantine for 3 days prior to having the procedure done.  His daughter was coming into town that weekend.  As result we further titrated his metoprolol to 75 mg twice a day which he did for 4 days and then increase to 200 mg twice a day.  He continues to be on Eliquis for anticoagulation.  He admits to increased fatigue but denies chest pain.   I saw Geremy in follow-up on November 15, 2018.  At that time he had titrated the metoprolol up to 100 mg twice a day.  He underwent successful DC cardioversion on November 20, 2018 with restoration of sinus rhythm.  Of note, he developed hypotension prior to awakening from propofol and received normal saline and Neo-Synephrine.  Blood pressure normalized loud snoring was noted, and it was advised to consider sleep study evaluation.  Over the month after his cardioversion Jekhi initially did well but he  inadvertently reduced his metoprolol from I suggested dose reduction to 75 mg twice a day and apparently has only been taking 25 mg twice a  day.  In addition, he has had difficulty with sinus congestion and was taking medication with pseudoephedrine.  He called the office on 12/20/18  stating that his Apple Watch had noted that his heart rate had been increased for almost a week and when I spoke with him his resting pulse was 135.  At that time I recommended he increase metoprolol to 50 mg twice a day and see me on the following day on December 21, 2018.  During that evaluation, he remained in atrial flutter with variable block at a rate of 115 bpm.  Recommended titration of metoprolol tartrate to 100 mg twice a day.  I discussed EP evaluation with consideration for atrial flutter ablation.  I again will had a lengthy discussion regarding the need for a sleep study due to high likelihood of obstructive sleep apnea.  He initially preferred trying the metoprolol increased dose to see if this would benefit him prior to an immediate EP evaluation.   I saw him in follow-up on the increase metoprolol 100 mg twice a day dosing at that time he was in 2-1 flutter with ventricular rate at 130.  I strongly recommended EP evaluation with atrial flutter ablation and discussed the case with Dr. Lovena Le who agreed to see the patient in the office on December 3 and plan to do atrial flutter ablation on December 4.  Since his sleep study was tentatively scheduled for December 2,  I recommended that this be deferred until after his ablation.   He underwent successful ablation of his atrial flutter on January 11, 2019 which was isthmus-dependent right atrial flutter upon presentation to the EP lab.  He was able to be successfully treated and was discharged later that evening in sinus rhythm.  I saw him on January 16, 2019 after he called the office the day previously complaining of some shortness of breath.  He again called the  office this morning stating that he was more short of breath, very fatigued and having no energy.  As result I added him onto my schedule  for further evaluation.  He denied any chest pain and was unaware of any palpitations; per his apple watch monitoring his resting pulse yesterday was 56 and today 57.  During that evaluation, I recommended that he undergo a chest x-ray, follow-up 2D echo Doppler study, and scheduled him for Wimer study to make certain his dyspnea was not ischemia mediated.  His echo Doppler study showed EF at 60 to 65% with mild LVH.  There was abnormal septal motion consistent with his postoperative state.  There was grade 2 diastolic dysfunction.  He had severe biatrial enlargement.  A chest x-ray did not reveal any active disease.  A Lexiscan Myoview study was low risk without ischemia with nuclear stress EF at 60%.  He underwent his sleep study on January 25, 2019.  Apparently several days prior to his sleep study he apparently again ran out of his metoprolol.  Of note during the sleep study his ECG revealed that he was in atrial fibrillation.  He was found to have severe obstructive sleep apnea in a split-night protocol with an AHI of 63.5/h.  CPAP was initiated but due to continued events was transitioned to BiPAP therapy and he was ultimately titrated to 18/14.  AHI at this level was 3.6 with oxygen nadir 93%.    I saw him for follow-up on February 14, 2019.  At that time he had resumed metoprolol.  His set up date  for BiPAP was February 04, 2019.  Over the past week and a half on therapy he has noticed significant improvement in his previous fatigability.  A download was obtained since his set up date and he was 100% compliant since December 28.  He had a mask leak.  At 18/14 BiPAP pressure AHI was improved but still increased at 6.3.  There were no central events.    Dontez was reevaluated by Dr. Lovena Le on February 19, 2019.  He was doing well and maintaining sinus rhythm.   Apparently was recommended that he reduce his metoprolol to 50 twice a day but apparently this has not been done.  I saw him on March 18 for his initial initial sleep clinic evaluation.  Of note, a download from January 8 through March 05, 2019 shows that he is meeting compliance standards with 84% of usage days and averaging 6 hours and 20 minutes.  AHI was 7.9 and his BiPAP device was set at a minimum EPAP of 12 with maximum IPAP of 22.  Apparently over the past month, Rodrico states that he has lost 10 pounds by essentially giving up bread and reducing carbohydrates.  He believes with his weight loss he was not needing CPAP.  He had issues with mask leak and apparently went to get a new mask which was a ResMed air fit F 30i from [facility].  Apparently, he never adjusted the straps to fit his head and as result this resulted in significant leakage and consequently for the last 30 days he has only used CPAP for 5 days and only for 2 hours and 6 minutes on average.  AHI was 9.6 /h.  During that evaluation I had a lengthy discussion with him again stressing the importance of increased incidence of recurrent atrial fibrillation with his severe sleep apnea if left untreated.  He was not wearing his mask appropriately and we spent significant time education and instruction on how to place the mask on to fit well without significant leak.  At that time I reduced his metoprolol down to 75 mg in the morning and 50 mg at night.  I saw him in June 2021 during the prior several months he had felt well.  Over the past several months he has felt well.  Again there were times where he was not typically compliant with BiPAP use but over the past several weeks prior to his evaluation he had been using it more regularly and has noticed more energy.  Several weeks ago when he was not using it he did experience significant fatigability.  He is unaware of any breakthrough atrial fibrillation or flutter.  He denies  recurrent anginal symptoms.  I obtained a new download  from May 25 through July 31, 2019.  This revealed reduced compliance with 19 out of 30 days but over the past several weeks he has been much more consistent with CPAP use and only missed 2 days.  At his minimum EPAP pressure of 10 and maximum pressure of 25 AHI was increased at 11.  His 95th percentile pressure was 17.1/13.1 with a maximum average pressure 18.1/14.1.  When I saw Bryceon on March 12, 2020 he was no longer dating his prior girlfriend.  When they were together she was playing a major role in his BiPAP compliance .  Apparently, Trashon stopped using BiPAP therapy and since September 2021 has only used on several occasions with approximately 8 days in October 1 day November and 1 day  in December.  He was unaware of any breakthrough palpitations or atrial flutter.  He denies chest pain or shortness of breath.  He denies any bleeding on Eliquis.  He admits to weight gain.  He is resuming exercise.  He would still like to undergo surgery to his right eyelid which continuously droops and inhibits his breathing capabilities.   I last saw him on August 03, 2020.  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  Surgery was done by Dr. Ninfa Linden.  Jaevin tolerated surgery well without cardiovascular issues.  Several weeks ago, he began to notice significantly increased fatigability.  He was no longer using BiPAP.  He went to [facility] to his beach house for a week and ultimately has felt significantly improved.  He also is now dating another woman and he believes this has significantly invigorated him.  He is unaware of any recurrent anginal symptoms.  He is now been on over-the-counter omega-3 fatty acid instead of his previous Vascepa.  He continues to be on Eliquis for anticoagulation without bleeding and continues to  take low-dose aspirin with his CAD.  He is on levothyroxine 150 mcg for hypothyroidism, metoprolol 50 mg twice a day and is unaware of any recurrent atrial flutter or fibrillation.  He is on rosuvastatin 20 mg daily.    Since I last saw Kassem, he was hospitalized in September 2022 with acute on chronic diastolic heart failure.  He was diuresed and discharged on daily Lasix.  He was subsequently seen by Jory Sims, NP in September 2022 and Fabian Sharp, [facility] in January 2023.  He was recently given clearance to undergo urologic surgery with insertion of inflatable penile implant which was done on March 08, 2022 by Dr.Graham Machen.  Eliquis was held for 2 days.  He tolerated surgery well.  Recently, Philliph has noticed some low blood pressure and heart rate.  Apparently, he had inadvertently gone back to taking metoprolol tartrate 50 mg twice a day instead of one half a pill or 25 mg twice daily.  He has continued to be on aspirin 81 mg in addition to Eliquis with his CAD.  He has been taking furosemide 40 mg twice a day for swelling, lisinopril 10 mg daily, supplemental potassium.  He continues to be on levothyroxine 150 mcg for hypothyroidism.  He continues to take rosuvastatin 40 mg in addition to Vascepa 2 capsules twice a day for hyperlipidemia.  He is diabetic on metformin and Ozempic weekly.  He is no longer using BiPAP.  He continues to be on Eliquis 5 mg twice a day.  He had recently called our office for a renewal of his furosemide and since I had not seen him since June 2022 he was scheduled for follow-up evaluation.   Past Medical History:  Diagnosis Date   Anxiety    takes Xanax daily as needed   Arthritis    CAD (coronary artery disease)    s/p CABG   Cataract    removed both eyes   Chronic diastolic CHF (congestive heart failure) ([place])    Depression    takes Celexa daily   Diverticulitis    Diverticulosis    Enlarged prostate    sightly   Esophagitis    Gastric ulcer    GERD  (gastroesophageal reflux disease)    takes Protonix daily   Hx of adenomatous  colonic polyps    benign   Hyperlipidemia    takes Zocor daily   Hypertension    Hypothyroidism    Insomnia    takes Ambien nightly   Joint pain    Joint swelling    OSA (obstructive sleep apnea)    Paroxysmal atrial fibrillation (HCC)    On Eliquis   Pre-diabetes     Past Surgical History:  Procedure Laterality Date   A-FLUTTER ABLATION N/A 01/11/2019   Procedure: A-FLUTTER ABLATION;  Surgeon: Evans Lance, MD;  Location: [place] [facility];  Service: Cardiovascular;  Laterality: N/A;   CARDIAC CATHETERIZATION     CARDIOVERSION N/A 11/20/2018   Procedure: CARDIOVERSION;  Surgeon: Troy Sine, MD;  Location: [facility] ENDOSCOPY;  Service: Cardiovascular;  Laterality: N/A;   cartliage removed from nose  27yr ago   cataract surgery Bilateral    CERVICAL SPINE SURGERY     COLONOSCOPY     CORONARY ARTERY BYPASS GRAFT N/A 11/16/2017   Procedure: CORONARY ARTERY BYPASS GRAFTING (CABG) x4. LEFT ENDOSCOPIC SAPHENOUS VEIN HARVEST AND MAMMARY ARTERY TAKE DOWN. LIMA TO LAD, SVG TO PDA, SVG TO DISTAL CIRC & OMI.;  Surgeon: GGrace Isaac MD;  [place]  Service: Open Heart Surgery;  Laterality: N/A;   EYE SURGERY     FOREIGN BODY REMOVAL ESOPHAGEAL     KNEE SURGERY Left    couple of times   LEFT HEART CATH AND CORONARY ANGIOGRAPHY N/A 11/15/2017   Procedure: LEFT HEART CATH AND CORONARY ANGIOGRAPHY;  Surgeon: KTroy Sine MD;  [place] [facility];  Service: Cardiovascular;  Laterality: N/A;   NASAL SINUS SURGERY     PENILE PROSTHESIS IMPLANT N/A 03/08/2022   Procedure: INSERTION OF INFLATABLE PENILE IMPLANT;  Surgeon: MVira Agar MD;  Location: [facility];  Service: Urology;  Laterality: N/A;  90 MINUTES NEEDED FOR CASE   POLYPECTOMY     TEE WITHOUT CARDIOVERSION N/A 11/16/2017   Procedure: TRANSESOPHAGEAL ECHOCARDIOGRAM (TEE);  Surgeon: GGrace Isaac MD;  [place] [facility] [place]   Service: Open Heart Surgery;  Laterality: N/A;   TONSILLECTOMY     TOTAL HIP ARTHROPLASTY Right 04/17/2020   Procedure: RIGHT TOTAL HIP ARTHROPLASTY ANTERIOR APPROACH;  Surgeon: BMcarthur Rossetti MD;  Location: [facility];  Service: Orthopedics;  Laterality: Right;   TOTAL KNEE ARTHROPLASTY Left 06/17/2014   Procedure: TOTAL KNEE ARTHROPLASTY;  Surgeon: PGarald Balding MD;  [place] [facility]  Service: Orthopedics;  Laterality: Left;    Current Medications: Outpatient Medications Prior to Visit  Medication Sig Dispense Refill   acyclovir (ZOVIRAX) 400 MG tablet Take 400 mg by mouth 2 (two) times daily.     aspirin EC 81 MG tablet Take 1 tablet (81 mg total) by mouth daily. 90 tablet 3   celecoxib (CELEBREX) 200 MG capsule Take 1 capsule (200 mg total) by mouth 2 (two) times daily. 28 capsule 1   cetirizine (ZYRTEC) 10 MG tablet Take 10 mg by mouth daily.     citalopram (CELEXA) 20 MG tablet Take 20 mg by mouth daily.     COSOPT 2-0.5 % ophthalmic solution Place 1 drop into the right eye 2 (two) times daily.     ELIQUIS 5 MG TABS tablet TAKE 1 TABLET(5 MG) BY MOUTH TWICE DAILY 180 tablet 1   ezetimibe (ZETIA) 10 MG tablet Take 1 tablet (10 mg total) by mouth daily. 90 tablet 3   levothyroxine (SYNTHROID) 150 MCG tablet TAKE 1 TABLET(150 MCG) BY  MOUTH DAILY 90 tablet 3   lisinopril (ZESTRIL) 10 MG tablet Take 1 tablet (10 mg total) by mouth daily. 30 tablet 0   metFORMIN (GLUCOPHAGE-XR) 500 MG 24 hr tablet Take 500 mg by mouth in the morning and at bedtime.     Misc Natural Products (JOINT HEALTH PO) Take 1 tablet by mouth daily.     Multiple Vitamins-Minerals (PRESERVISION AREDS 2 PO) Take 1 tablet by mouth 2 (two) times daily.     OZEMPIC, 1 MG/DOSE, 4 MG/3ML SOPN Inject 1 mg into the skin every Tuesday.     pantoprazole (PROTONIX) 20 MG tablet TAKE 2 TABLETS(40 MG) BY MOUTH DAILY (Patient taking differently: Take 40 mg by mouth at bedtime.) 180 tablet 1   potassium chloride (KLOR-CON) 20  MEQ packet Take 20 mEq by mouth daily. 30 each 0   prednisoLONE acetate (PRED FORTE) 1 % ophthalmic suspension Place 1 drop into the right eye 4 (four) times daily.     rosuvastatin (CRESTOR) 40 MG tablet TAKE 1 TABLET(40 MG) BY MOUTH DAILY 90 tablet 3   zolpidem (AMBIEN) 10 MG tablet Take 10 mg by mouth at bedtime.     furosemide (LASIX) 40 MG tablet Take 1 tablet (40 mg total) by mouth 2 (two) times daily. 180 tablet 1   metoprolol tartrate (LOPRESSOR) 50 MG tablet TAKE 1 TABLET(50 MG) BY MOUTH TWICE DAILY 180 tablet 2   acetaminophen (TYLENOL) 500 MG tablet Take 2 tablets (1,000 mg total) by mouth every 6 (six) hours. (Patient not taking: Reported on 03/21/2022) 30 tablet 1   ferrous sulfate 325 (65 FE) MG tablet Take 325 mg by mouth daily. (Patient not taking: Reported on 03/21/2022)     icosapent Ethyl (VASCEPA) 1 g capsule TAKE 2 CAPSULES(2 GRAMS) BY MOUTH TWICE DAILY (Patient not taking: Reported on 03/21/2022) 360 capsule 1   nitroGLYCERIN (NITROSTAT) 0.4 MG SL tablet DISSOLVE 1 TABLET UNDER THE TONGUE EVERY 5 MINUTES AS NEEDED FOR CHEST PAIN (Patient not taking: Reported on 03/21/2022) 50 tablet 1   oxyCODONE (OXY IR/ROXICODONE) 5 MG immediate release tablet Take 1 tablet (5 mg total) by mouth every 6 (six) hours as needed for severe pain. (Patient not taking: Reported on 03/21/2022) 16 tablet 0   sulfamethoxazole-trimethoprim (BACTRIM DS) 800-160 MG tablet Take 1 tablet by mouth 2 (two) times daily. (Patient not taking: Reported on 03/21/2022) 14 tablet 0   timolol (TIMOPTIC) 0.5 % ophthalmic solution Place 1 drop into the right eye 2 (two) times daily. (Patient not taking: Reported on 03/21/2022)     No facility-administered medications prior to visit.     Allergies:   Patient has no known allergies.   Social History   Socioeconomic History   Marital status: Single    Spouse name: Not on file   Number of children: 3   Years of education: Not on file   Highest education level: Some  college, no degree  Occupational History   Occupation: Owner  Tobacco Use   Smoking status: Never   Smokeless tobacco: Never  Vaping Use   Vaping Use: Never used  Substance and Sexual Activity   Alcohol use: Yes    Alcohol/week: 10.0 standard drinks of alcohol    Types: 10 Standard drinks or equivalent per week    Comment: 2 drinks 5 nights per week   Drug use: No   Sexual activity: Never  Other Topics Concern   Not on file  Social History Narrative   Not on file  Social Determinants of Health   Financial Resource Strain: Low Risk  (10/29/2020)   Overall Financial Resource Strain (CARDIA)    Difficulty of Paying Living Expenses: Not hard at all  Food Insecurity: No Food Insecurity (10/29/2020)   Hunger Vital Sign    Worried About Running Out of Food in the Last Year: Never true    Ran Out of Food in the Last Year: Never true  Transportation Needs: No Transportation Needs (10/29/2020)   PRAPARE - Hydrologist (Medical): No    Lack of Transportation (Non-Medical): No  Physical Activity: Sufficiently Active (10/29/2020)   Exercise Vital Sign    Days of Exercise per Week: 3 days    Minutes of Exercise per Session: 60 min  Stress: No Stress Concern Present (02/15/2018)   [place]    Feeling of Stress : Only a little  Social Connections: Not on file    Additional social history is notable in that he is divorced x2.  He has 3 children and his oldest son lives in [place], works for [employer] and will be relocating with his promotion to [place].   His second son  had lived [place] and was married in [place] and in June 2020 he moved back to the [place] area.  His daughter works in [place] as a Government social research officer in a Web designer.  There is no tobacco use.  He drinks alcohol.  Family History:  The patient's family history includes Colon cancer in his paternal grandfather.   Mother died at age 23 with emphysema.  His father died at 69.  He has 2 sisters ages 66 and 69.  ROS General: Negative; No fevers, chills, or night sweats; positive for fatigue HEENT: Decreased hearing with hearing aid in right ear, no sinus congestion, difficulty swallowing; right eyelid droop Pulmonary: Negative; No cough, wheezing, shortness of breath, hemoptysis Cardiovascular: See HPI GI: History of diverticular disease and colonic polyps. GU: Remote history of genital herpes; erectile dysfunction; status post inflatable penile implant March 08, 2022 Musculoskeletal: Negative; no myalgias, joint pain, or weakness Hematologic/Oncology: Negative; no easy bruising, bleeding Endocrine: Positive for hypothyroidism Neuro: Negative; no changes in balance, headaches Skin: Negative; No rashes or skin lesions Psychiatric:  Sleep: Positive snoring snoring, previous fatigue; documented to have severe OSA with recommended treatment with BiPAP therapy; no bruxism, restless legs, hypnogognic hallucinations, no cataplexy He has not used BiPAP since 2021. Other comprehensive 14 point system review is negative.   PHYSICAL EXAM:   VS:  BP (!) 100/54 (BP Location: Left Arm, Patient Position: Sitting, Cuff Size: Normal)   Pulse (!) 49   Ht 5' 7.5" (1.715 m)   Wt 210 lb 6.4 oz (95.4 kg)   SpO2 97%   BMI 32.47 kg/m     Repeat blood pressure by me was 100/58 supine and 106/60 standing , Wt Readings from Last 3 Encounters:  03/21/22 210 lb 6.4 oz (95.4 kg)  02/25/22 206 lb (93.4 kg)  03/09/21 213 lb (96.6 kg)     General: Alert, oriented, no distress.  Skin: normal turgor, no rashes, warm and dry HEENT: Normocephalic, atraumatic. Pupils equal round and reactive to light; sclera anicteric; extraocular muscles intact;  Nose without nasal septal hypertrophy Mouth/Parynx benign; Mallinpatti scale 3 Neck: No JVD, no carotid bruits; normal carotid upstroke Lungs: clear to ausculatation and  percussion; no wheezing or rales Chest wall: without tenderness to palpitation Heart: PMI  not displaced, RRR, s1 s2 normal, 1/6 systolic murmur, no diastolic murmur, no rubs, gallops, thrills, or heaves Abdomen: soft, nontender; no hepatosplenomehaly, BS+; abdominal aorta nontender and not dilated by palpation. Back: no CVA tenderness Pulses 2+ Musculoskeletal: full range of motion, normal strength, no joint deformities Extremities: no clubbing cyanosis or edema, Homan's sign negative  Neurologic: grossly nonfocal; Cranial nerves grossly wnl Psychologic: Normal mood and affect    Studies/Labs Reviewed:   March 21, 2022 ECG (independently read by me): Sinus bradycardia at 49, RBBB  August 03, 2020 ECG (independently read by me):  Sinus bradycadia at 47; no ectopy    March 12, 2020 ECG (independently read by me): Sinus bradycardia 51 bpm. Possible left atrial enlargement. VH. No ectopy. Normal intervals.  June 2021 ECG (independently read by me):Sinus bradycardia at 53; QTc 437 msec  March 18, 2021ECG (independently read by me):Sinus bradycardia at 48; QTc 441 msec; No ST changes; no ectopy  January 7, 2021ECG (independently read by me): Sinus Bradycardia at 51; no STT changes; QTc 442 msec; no ectopy  January 16, 2019 ECG (independently read by me): Sinus bradycardia at 57 bpm with possible left atrial enlargement.  PR interval 176 ms, QTc interval 476 ms.  No ST segment changes.  January 02, 2019 ECG (independently read by me): Atrial flutter with 2-1 AV conduction with a ventricular rate of 130 bpm.  December 21, 2018 ECG (independently read by me): Atrial flutter with variable block at 115, QT 402,QTC 556 msec  Cardioversion November 20, 2018 with restoration of sinus rhythm.  November 15, 2018 ECG (independently read by me): Atrial Flutter with variable block at 97 bpm  September 2020 ECG (independently read by me): Atrial flutter with variable block with an average heart rate  at 115 bpm  October 29, 2018 ECG (independently read by me): Atrial flutter with variable block 130 bpm.  No specific ST-T changes  October 08, 2018 ECG (independently read by me): NSR at 60; isolated PVC; QTc 434 msec.  April 02, 2018 ECG (independently read by me): NSR 62; Nonspecific T change; QTc 452 msec  December 29, 2017 ECG (independently read by me): Sinus bradycardia at 49 bpm.  Nonspecific ST changes.  QTc interval 464 ms.  No ectopy.  ECG (independently read by me): Sinus rhythm at 64 bpm.  Right bundle branch block with repolarization changes.  No ectopy.  October 24, 2017 ECG (independently read by me): Normal sinus rhythm at 62 bpm.  Right bundle branch block with repolarization changes.   ECG  April 18, 2014 revealed normal sinus rhythm.  Right bundle branch block was not present.  Recent Labs:    Latest Ref Rng & Units 02/25/2022    1:30 PM 05/03/2021    3:04 PM 04/22/2021    3:51 PM  BMP  Glucose 70 - 99 mg/dL 127  74  109   BUN 8 - 23 mg/dL 30  17  24   $ Creatinine 0.61 - 1.24 mg/dL 1.06  1.02  1.41   BUN/Creat Ratio 10 - 24  17  17   $ Sodium 135 - 145 mmol/L 137  141  141   Potassium 3.5 - 5.1 mmol/L 4.1  4.7  4.4   Chloride 98 - 111 mmol/L 100  99  102   CO2 22 - 32 mmol/L 24  21  20   $ Calcium 8.9 - 10.3 mg/dL 9.2  9.8  9.2         Latest Ref Rng &  Units 12/02/2020   10:02 PM 10/27/2020    5:08 PM 08/03/2020   12:36 PM  Hepatic Function  Total Protein 6.5 - 8.1 g/dL 7.0  6.7  6.8   Albumin 3.5 - 5.0 g/dL 4.2  3.9  4.7   AST 15 - 41 U/L 57  24  29   ALT 0 - 44 U/L 51  25  36   Alk Phosphatase 38 - 126 U/L 103  113  127   Total Bilirubin 0.3 - 1.2 mg/dL 0.7  0.9  0.4        Latest Ref Rng & Units 02/25/2022    1:30 PM 12/02/2020   10:02 PM 10/29/2020    3:47 AM  CBC  WBC 4.0 - 10.5 K/uL 8.4  12.0  7.3   Hemoglobin 13.0 - 17.0 g/dL 15.2  14.2  16.5   Hematocrit 39.0 - 52.0 % 44.8  41.3  47.9   Platelets 150 - 400 K/uL 228  238  218    Lab  Results  Component Value Date   MCV 97.0 02/25/2022   MCV 93.0 12/02/2020   MCV 95.0 10/29/2020   Lab Results  Component Value Date   TSH 2.520 08/03/2020   Lab Results  Component Value Date   HGBA1C 5.5 02/25/2022     BNP    Component Value Date/Time   BNP 667.7 (H) 10/27/2020 1708    ProBNP No results found for: "PROBNP"   Lipid Panel     Component Value Date/Time   CHOL 158 08/03/2020 1444   TRIG 172 (H) 08/03/2020 1444   HDL 46 08/03/2020 1444   CHOLHDL 3.4 08/03/2020 1444   CHOLHDL 3.4 01/29/2007 0410   VLDL 17 01/29/2007 0410   LDLCALC 83 08/03/2020 1444     RADIOLOGY: No results found.    Additional studies/ records that were reviewed today include:  I reviewed his prior office visits and most recent DC cardioversion.  CONCLUSIONS:  1. Isthmus-dependent right atrial flutter upon presentation.  2. Successful radiofrequency ablation of atrial flutter along the cavotricuspid isthmus with complete bidirectional isthmus block achieved.  3. No inducible arrhythmias following ablation.  4. No early apparent complications.    Cristopher Peru, MD  12:37 PM 01/11/2019    SPLIT NIGHT SLEEP STUDY: 01/25/2019 SLEEP STUDY TECHNIQUE As per the AASM Manual for the Scoring of Sleep and Associated Events v2.3 (April 2016) with a hypopnea requiring 4% desaturations.   The channels recorded and monitored were frontal, central and occipital EEG, electrooculogram (EOG), submentalis EMG (chin), nasal and oral airflow, thoracic and abdominal wall motion, anterior tibialis EMG, snore microphone, electrocardiogram, and pulse oximetry. Bi-level positive airway pressure (BiPAP) was initiated when the patient met split night criteria and was titrated according to treat sleep-disordered breathing.   RESPIRATORY PARAMETERS Diagnostic Total AHI (/hr):            63.5     RDI (/hr):         65.8     OA Index (/hr):            17.7     CA Index (/hr):      5.9 REM AHI (/hr):             N/A      NREM AHI (/hr):          63.5     Supine AHI (/hr):         63.5     Non-supine AHI (/  hr):        N/A Min O2 Sat (%):          81.0     Mean O2 (%):  93.0     Time below 88% (min):           18.7        Titration Optimal IPAP Pressure (cm): 18        Optimal EPAP Pressure (cm):            14        AHI at Optimal Pressure (/hr):          3.6       Min O2 at Optimal Pressure (%):       93.0 Sleep % at Optimal (%):         97        Supine % at Optimal (%):       7             SLEEP ARCHITECTURE The study was initiated at 10:10:34 PM and terminated at 4:34:09 AM. The total recorded time was 383.6 minutes. EEG confirmed total sleep time was 333.3 minutes yielding a sleep efficiency of 86.9%%. Sleep onset after lights out was 0.8 minutes with a REM latency of 195.0 minutes. The patient spent 1.5%% of the night in stage N1 sleep, 80.9%% in stage N2 sleep, 0.0%% in stage N3 and 17.6% in REM. Wake after sleep onset (WASO) was 49.5 minutes. The Arousal Index was 32.0/hour.   LEG MOVEMENT DATA The total Periodic Limb Movements of Sleep (PLMS) were 0. The PLMS index was 0.0 .   CARDIAC DATA The 2 lead EKG demonstrated sinus rhythm. The mean heart rate was 100.0 beats per minute. Other EKG findings include: None.   IMPRESSIONS - Severe obstructive sleep apnea occurred during the diagnostic portion of the study (AHI 63.5 /h; RDI 65.8/h). CPAP was initiated at 5 cm and was titrated to 9 cm . Due to frequent central events, BiPAP was implemented at 11/7 and was titrated to 18/14 cm of water: AHI 3.6/h, O2 nadir 93%. 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 - Moderate oxygen desaturation was noted during the diagnostic portion of the study (Min O2 = 81.0%). - The patient snored with loud snoring volume during the diagnostic portion of the study. - It appears that the patient was in atrial fibrillation throughtout the study. - Clinically  significant periodic limb movements of sleep did not occur during the study.   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 A Small size Fisher[employer]Paykel Full Face Mask Simplus mask was used for the titration. If patient continues to experience central events, ASV titration may be necessary. - Effort should be made to optimize nasal and oropharyngeal patency. - Avoid alcohol, sedatives and other CNS depressants that may worsen sleep apnea and disrupt normal sleep architecture. - Sleep hygiene should be reviewed to assess factors that may improve sleep quality. - Weight management and regular exercise should be initiated or continued. - Recoomend f/u cardiology evalution for probable recurrent atrial fibrillation.  - Recommend a download in 30 days and sleep clinic evaluation after 30 days of therapy.   [Electronically signed] 01/27/2019 04:13 PM   Shelva Majestic MD, [facility], ABSM Diplomate, American Board of  Sleep Medicine    ASSESSMENT:    1. Coronary artery disease involving native coronary artery of native heart without angina pectoris   2. Mixed hyperlipidemia   3. Paroxysmal atrial fibrillation (HCC)   4. Chronic diastolic heart failure ([place])   5. S/P CABG x 4   6. S/P ablation of atrial flutter   7. OSA (obstructive sleep apnea): Previously on BiPAP, currently on no therapy   8. Acquired hypothyroidism   9. Type 2 diabetes mellitus with complication, without long-term current use of insulin [facility])     PLAN:  Mr. 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  He retired at the end of December 2021.  He has a history of mild hyperlipidemia and remotely had been on simvastatin 40 mg daily and had been followed by Dr. Hulan Fess.  In the fall 2019 he began to  notice mild shortness of breath particularly with walking up steps.  An echo Doppler study revealed hyperdynamic LV function with an EF of 65 to 70% but with grade 2 diastolic dysfunction and abnormal tissue Doppler.  A CT coronary angiogram was suggestive of significant multivessel CAD and was FFR positive.  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 Servando Snare.  His postoperative course was complicated by paroxysmal atrial fibrillation for which he was started on amiodarone and ultimately was discharged on Eliquis.  When I saw him for initial post hospital evaluation he was still on amiodarone 200 mg twice a day, metoprolol tartrate 25 mg twice a day.  His ECG showed sinus bradycardia 5 weeks status post CABG revascularization; amiodarone was decreased to 200 mg daily and subsequently discontinued when subsequent laboratory showed LFT elevation and further increase in his TSH level.  With his hypothyroidism he required increasing doses of levothyroxine, ultimately titrated to his present dose of 150 mcg daily.  He had developed episodes of atrial flutter and underwent initial cardioversion in October 2020.  Due to recurrent atrial flutter he was ultimately referred to Dr. Lovena Le and underwent successful a flutter ablation on January 12, 2019.  Due to concerns for obstructive sleep apnea he ultimately was able to have his sleep study which confirmed my suspicion.  He was found to have severe sleep apnea with an AHI of over 60 times per hour with significant oxygen desaturation to a nadir of 80%.  When I saw him for sleep and follow-up of his ablation in early January,  he has been on BiPAP therapy for 9 days and felt marked improvement in his previous fatigability and energy.   Of note he had run out of his metoprolol 2 days prior to his sleep study and he was back in atrial fibrillation  during his sleep evaluation.  His ECG at his January visit on metoprolol 75 mg twice a day revealed  sinus rhythm with asymptomatic sinus bradycardia 51 bpm.  He underwent successful right hip replacement surgery without cardiovascular compromise.  I have not seen him in the office since June 2022.  He never reinstituted BiPAP therapy and has been off treatment since 2021.  His primary care provider retired and he is now followed by Amelia Jo, [place] at [place].  He has been on chronic Eliquis for anticoagulation with his history of atrial fibrillation.  On March 08, 2022 he underwent successful  urologic surgery by Dr. Phillip Heal mention with an inflatable penile implant.  He tolerated surgery without cardiovascular compromise.  Currently, he had recently not been taking the metoprolol at the read previously recommended reduced dose of 25 mg twice a day but apparently has been taking the whole pill twice a day.  He is recently noticed more fatigability.  Heart rate also is slow and today is 49 blood pressure by me was 100/58 supine and 106/60 standing.  I recommend that he decrease metoprolol to tartrate back down to 25 mg twice a day.  I am also decreasing his furosemide which she has been taking 40 mg twice a day and he will change this to 40 mg in the morning and 20 mg at night with the possibility of completely discontinuing the evening dose depending upon blood pressure and leg swelling.  He continues to be on lisinopril 10 mg.  He is on rosuvastatin 40 mg and Vascepa 2 capsules twice a day for mixed hyperlipidemia.  He is diabetic now on Ozempic in addition to metformin.  He is on levothyroxine 150 mcg for hypothyroidism.  He will monitor his blood pressure and heart rate.  He continues to be on anticoagulation.  I am recommending follow-up laboratory be obtained in April and I will see him in May for follow-up office evaluation.   A week and a half ago he had called our office complaining that he was much  more fatigued.  At that time I recommended that he come to the office the following day but he was leaving to [place] and therefore could not come at that time but presents today for evaluation.  In the interim, he has noticed improvement and he is no longer fatigued.  He is now dating and new girlfriend regularly and feels invigorated.  His ECG continues to show sinus rhythm.  He is bradycardic.  If continued fatigability recurs, depending upon his thyroid status it may be necessary to slightly reduce his beta-blocker regimen.  He is unaware of recurrent arrhythmia.  I have recommending repeat fasting laboratory today with a comprehensive metabolic panel, CBC, TSH, fasting lipid studies and hemoglobin A1c.  Adjustments will be made to his medical regimen depending upon the results.  He had held his Eliquis for his surgery.  He again reiterated that he is sleeping well and denies any daytime sleepiness.  He is unaware of snoring.  I will see him in 6 months for reevaluation     Medication Adjustments/Labs and Tests Ordered: Current medicines are reviewed at length with the patient today.  Concerns regarding medicines are outlined above.  Medication changes, Labs and Tests ordered today are listed in the Patient Instructions below. Patient Instructions  Medication Instructions:  Your physician has recommended you make the following change in your medication:  DECREASE: Metoprolol 17m twice daily CHANGE: Lasix 454min the AM and 2075m0.5 tablet) in the PM  *If you need a refill on your cardiac medications before your next appointment, please call your pharmacy*   Lab Work: Your physician recommends that you return in April to have the following labs drawn: CMET, CBC, TSH, and LIPID  If you have labs (blood work) drawn today and your tests are completely normal, you will receive your results only by: [place] you have MyChart) OR A paper copy in the mail If you have any lab test  that is abnormal or we need to change your treatment, we will call you to review  the results.   Testing/Procedures: NONE   Follow-Up: At [facility], you and your health needs are our priority.  As part of our continuing mission to provide you with exceptional heart care, we have created designated Provider Care Teams.  These Care Teams include your primary Cardiologist (physician) and Advanced Practice Providers (APPs -  Physician Assistants and Nurse Practitioners) who all work together to provide you with the care you need, when you need it.  We recommend signing up for the patient portal called "MyChart".  Sign up information is provided on this After Visit Summary.  MyChart is used to connect with patients for Virtual Visits (Telemedicine).  Patients are able to view lab/test results, encounter notes, upcoming appointments, etc.  Non-urgent messages can be sent to your provider as well.   To learn more about what you can do with MyChart, go to [email or web address].    Your next appointment:   3 month(s)  Provider:   Shelva Majestic, MD      Signed, Shelva Majestic, MD  03/26/2022 3:07 PM    [facility] [address], [place], [place], [place]  [address] Phone: [phone]

## 2022-03-25 DIAGNOSIS — T8579XS Infection and inflammatory reaction due to other internal prosthetic devices, implants and grafts, sequela: Secondary | ICD-10-CM | POA: Diagnosis not present

## 2022-03-25 DIAGNOSIS — H40051 Ocular hypertension, right eye: Secondary | ICD-10-CM | POA: Diagnosis not present

## 2022-03-26 ENCOUNTER — Encounter: Payer: Self-pay | Admitting: Cardiovascular Disease

## 2022-04-07 DIAGNOSIS — N5201 Erectile dysfunction due to arterial insufficiency: Secondary | ICD-10-CM | POA: Diagnosis not present

## 2022-04-14 ENCOUNTER — Other Ambulatory Visit: Payer: Self-pay | Admitting: Cardiovascular Disease

## 2022-04-14 ENCOUNTER — Other Ambulatory Visit: Payer: Self-pay | Admitting: Internal Medicine

## 2022-04-14 DIAGNOSIS — I4891 Unspecified atrial fibrillation: Secondary | ICD-10-CM

## 2022-04-14 NOTE — Telephone Encounter (Signed)
Prescription refill request for Eliquis received.  Indication: afib  Last office visit: Claiborne Billings 03/21/2022 Scr: 1.06, 02/25/2022 Age: 77 yo  Weight: 95.4 kg   Refill sent.

## 2022-04-15 ENCOUNTER — Other Ambulatory Visit: Payer: Self-pay

## 2022-04-18 ENCOUNTER — Ambulatory Visit (INDEPENDENT_AMBULATORY_CARE_PROVIDER_SITE_OTHER): Payer: PPO

## 2022-04-18 ENCOUNTER — Ambulatory Visit (INDEPENDENT_AMBULATORY_CARE_PROVIDER_SITE_OTHER): Payer: PPO | Admitting: Physician Assistant

## 2022-04-18 ENCOUNTER — Encounter: Payer: Self-pay | Admitting: Physician Assistant

## 2022-04-18 DIAGNOSIS — M25552 Pain in left hip: Secondary | ICD-10-CM

## 2022-04-18 MED ORDER — OXYCODONE HCL 5 MG PO TABS: 5.0000 mg | ORAL_TABLET | Freq: Four times a day (QID) | ORAL | 0 refills | Status: AC | PRN

## 2022-04-18 NOTE — Progress Notes (Signed)
Office Visit Note   Patient: Carl [name]           Date of Birth: 11/22/1945           MRN: [id number] Visit Date: 04/18/2022              Requested by: Amelia Jo, [place],  [place] [address] PCP: Amelia Jo, PA  Chief Complaint  Patient presents with   Left Hip - Pain      HPI: [name] is a pleasant 77 year old gentleman who is a patient of Dr. Trevor Mace.  He is status post right total hip arthroplasty in 2022 and is doing well.  He is concerned because he had a fall onto his left posterior buttock about a week ago.  He said at first it did not hurt but then he had significant pain.  He does [place] any groin pain but says most the pain was in his posterior buttock.  Denies any paresthesias or any weakness or loss of bowel or bladder control  Assessment & Plan: Visit Diagnoses:  1. Pain of left hip     Plan: No evidence of a fracture of the left hip and he has no tenderness in the groin or with manipulation of the groin.  He does have some arthritis in this hip but again no evidence of any acute fracture.  I think he does have an element of low back pain could have a small compression fracture.  Will treat this symptomatically.  He knows to use topical anti-inflammatory he is going to [facility] I will give him a few oxycodone he understands this is not going to be a long-term medication.  Would like to follow back with him in a week.  If he still having pain would get lumbar spine films  Follow-Up Instructions: No follow-ups on file.   Ortho Exam  Patient is alert, oriented, no adenopathy, well-dressed, normal affect, normal respiratory effort. Examination patient appears well he has no pain with manipulation internal or logrolling of his left hip.  No tenderness over the lateral hip.  No radiation of symptoms he has 5 out of 5 strength with dorsiflexion plantarflexion of his ankles.  Good extension and flexion of his legs.  He has no pain with forward  flexion or extension of his back.  Does have some tenderness to palpation over the lower spine and left buttock with probably just mild resolving ecchymosis.  He is neurovascular intact  Imaging: XR HIP UNILAT W OR W/O PELVIS 2-3 VIEWS LEFT  Result Date: 04/18/2022 2 view radiographs of his left hip demonstrate well-maintained alignment.  He does have some degenerative changes with sclerotic changes inferior osteophytes.  No evidence of dislocation very similar x-rays to those taken a year and a half ago.  He is status post right hip replacement.  No evidence of a pelvic fracture  No images are attached to the encounter.  Labs: Lab Results  Component Value Date   HGBA1C 5.5 02/25/2022   HGBA1C 5.5 08/03/2020   HGBA1C 5.4 11/15/2017   REPTSTATUS 02/27/2022 FINAL 02/25/2022   CULT  02/25/2022    NO GROWTH Performed at [facility], [place] [address], [place], [place] [address]      Lab Results  Component Value Date   ALBUMIN 4.2 12/02/2020   ALBUMIN 3.9 10/27/2020   ALBUMIN 4.7 08/03/2020    Lab Results  Component Value Date   MG 2.3 10/28/2020   MG 2.3  10/29/2018   MG 2.2 11/19/2017   No results found for: "VD25OH"  No results found for: "PREALBUMIN"    Latest Ref Rng & Units 02/25/2022    1:30 PM 12/02/2020   10:02 PM 10/29/2020    3:47 AM  CBC EXTENDED  WBC 4.0 - 10.5 K/uL 8.4  12.0  7.3   RBC 4.22 - 5.81 MIL/uL 4.62  4.44  5.04   Hemoglobin 13.0 - 17.0 g/dL 15.2  14.2  16.5   HCT 39.0 - 52.0 % 44.8  41.3  47.9   Platelets 150 - 400 K/uL 228  238  218   NEUT# 1.7 - 7.7 K/uL  7.9  4.4   Lymph# 0.7 - 4.0 K/uL  1.8  1.3      There is no height or weight on file to calculate BMI.  Orders:  Orders Placed This Encounter  Procedures   XR HIP UNILAT W OR W/O PELVIS 2-3 VIEWS LEFT   Meds ordered this encounter  Medications   oxyCODONE (OXY IR/ROXICODONE) 5 MG immediate release tablet    Sig: Take 1 tablet (5 mg total) by mouth every 6 (six) hours as needed for  severe pain.    Dispense:  16 tablet    Refill:  0     Procedures: No procedures performed  Clinical Data: No additional findings.  ROS:  All other systems negative, except as noted in the HPI. Review of Systems  Objective: Vital Signs: There were no vitals taken for this visit.  Specialty Comments:  No specialty comments available.  PMFS History: Patient Active Problem List   Diagnosis Date Noted   S/P insertion of penile implant 03/08/2022   Acute on chronic diastolic CHF (congestive heart failure) ([place]) 10/27/2020   CAD (coronary artery disease) 10/27/2020   Hypothyroidism 10/27/2020   Paroxysmal atrial fibrillation ([place]) 10/27/2020   Benign hypertension 07/22/2020   Drug-induced hypothyroidism 07/22/2020   Glaucoma 07/22/2020   Impaired fasting glucose 07/22/2020   Insomnia 07/22/2020   Macrocytosis 07/22/2020   Microscopic hematuria 07/22/2020   OSA (obstructive sleep apnea) 07/22/2020   Personal history of transient ischemic attack (TIA), and cerebral infarction without residual deficits 07/22/2020   Presence of aortocoronary bypass graft 07/22/2020   Transient ischemic attack 07/22/2020   Unspecified abnormal finding in specimens from other organs, systems and tissues 07/22/2020   Status post total replacement of right hip 04/17/2020   Avascular necrosis of bone of right hip ([place]) 04/07/2020   Pain in right hip 02/11/2020   Low back pain 02/11/2020   Typical atrial flutter ([place])    Impacted cerumen of both ears 07/16/2018   TSH elevation 11/21/2017   Atrial fibrillation with rapid ventricular response (HCC)    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/16/2017   Atherosclerotic heart disease of native coronary artery without angina pectoris 11/15/2017   Abnormal cardiac CT angiography    RLQ abdominal pain 07/07/2015   Primary osteoarthritis of left knee 06/17/2014   S/P total knee replacement using cement 06/17/2014   Preoperative cardiovascular examination 04/19/2014   Mixed  hyperlipidemia 04/19/2014   AP (abdominal pain) 01/16/2014   History of diverticulitis 01/16/2014   GERD 01/14/2009   ULCER-GASTRIC 01/14/2009   GASTRITIS 11/14/2008   DIVERTICULITIS OF COLON 11/13/2008   BLOOD IN STOOL 11/13/2008   ABDOMINAL PAIN 11/13/2008   PERSONAL HISTORY OF COLONIC POLYPS 11/13/2008   Past Medical History:  Diagnosis Date   Anxiety    takes Xanax daily as needed   Arthritis  CAD (coronary artery disease)    s/p CABG   Cataract    removed both eyes   Chronic diastolic CHF (congestive heart failure) ([place])    Depression    takes Celexa daily   Diverticulitis    Diverticulosis    Enlarged prostate    sightly   Esophagitis    Gastric ulcer    GERD (gastroesophageal reflux disease)    takes Protonix daily   Hx of adenomatous colonic polyps    benign   Hyperlipidemia    takes Zocor daily   Hypertension    Hypothyroidism    Insomnia    takes Ambien nightly   Joint pain    Joint swelling    OSA (obstructive sleep apnea)    Paroxysmal atrial fibrillation (HCC)    On Eliquis   Pre-diabetes     Family History  Problem Relation Age of Onset   Colon cancer Paternal Grandfather        questionable   Colon polyps Neg Hx    Esophageal cancer Neg Hx    Rectal cancer Neg Hx    Stomach cancer Neg Hx     Past Surgical History:  Procedure Laterality Date   A-FLUTTER ABLATION N/A 01/11/2019   Procedure: A-FLUTTER ABLATION;  Surgeon: Evans Lance, MD;  Location: [place] [facility];  Service: Cardiovascular;  Laterality: N/A;   CARDIAC CATHETERIZATION     CARDIOVERSION N/A 11/20/2018   Procedure: CARDIOVERSION;  Surgeon: Troy Sine, MD;  Location: [facility] ENDOSCOPY;  Service: Cardiovascular;  Laterality: N/A;   cartliage removed from nose  38yr ago   cataract surgery Bilateral    CERVICAL SPINE SURGERY     COLONOSCOPY     CORONARY ARTERY BYPASS GRAFT N/A 11/16/2017   Procedure: CORONARY ARTERY BYPASS GRAFTING (CABG) x4. LEFT ENDOSCOPIC SAPHENOUS  VEIN HARVEST AND MAMMARY ARTERY TAKE DOWN. LIMA TO LAD, SVG TO PDA, SVG TO DISTAL CIRC & OMI.;  Surgeon: GGrace Isaac MD;  [place]  Service: Open Heart Surgery;  Laterality: N/A;   EYE SURGERY     FOREIGN BODY REMOVAL ESOPHAGEAL     KNEE SURGERY Left    couple of times   LEFT HEART CATH AND CORONARY ANGIOGRAPHY N/A 11/15/2017   Procedure: LEFT HEART CATH AND CORONARY ANGIOGRAPHY;  Surgeon: KTroy Sine MD;  [facility];  Service: Cardiovascular;  Laterality: N/A;   NASAL SINUS SURGERY     PENILE PROSTHESIS IMPLANT N/A 03/08/2022   Procedure: INSERTION OF INFLATABLE PENILE IMPLANT;  Surgeon: MVira Agar MD;  [facility];  Service: Urology;  Laterality: N/A;  90 MINUTES NEEDED FOR CASE   POLYPECTOMY     TEE WITHOUT CARDIOVERSION N/A 11/16/2017   Procedure: TRANSESOPHAGEAL ECHOCARDIOGRAM (TEE);  Surgeon: GGrace Isaac MD;  [place] [facility]  Service: Open Heart Surgery;  Laterality: N/A;   TONSILLECTOMY     TOTAL HIP ARTHROPLASTY Right 04/17/2020   Procedure: RIGHT TOTAL HIP ARTHROPLASTY ANTERIOR APPROACH;  Surgeon: BMcarthur Rossetti MD;  [facility];  Service: Orthopedics;  Laterality: Right;   TOTAL KNEE ARTHROPLASTY Left 06/17/2014   Procedure: TOTAL KNEE ARTHROPLASTY;  Surgeon: PGarald Balding MD;  [place] [facility]  Service: Orthopedics;  Laterality: Left;   Social History   Occupational History   Occupation: Owner  Tobacco Use   Smoking status: Never   Smokeless tobacco: Never  Vaping Use   Vaping Use: Never used  Substance and Sexual Activity  Alcohol use: Yes    Alcohol/week: 10.0 standard drinks of alcohol    Types: 10 Standard drinks or equivalent per week    Comment: 2 drinks 5 nights per week   Drug use: No   Sexual activity: Never

## 2022-04-19 ENCOUNTER — Other Ambulatory Visit: Payer: Self-pay

## 2022-04-19 DIAGNOSIS — E782 Mixed hyperlipidemia: Secondary | ICD-10-CM

## 2022-04-19 DIAGNOSIS — I2510 Atherosclerotic heart disease of native coronary artery without angina pectoris: Secondary | ICD-10-CM | POA: Diagnosis not present

## 2022-04-19 LAB — COMPREHENSIVE METABOLIC PANEL
ALT: 29 IU/L (ref 0–44)
AST: 30 IU/L (ref 0–40)
Albumin/Globulin Ratio: 1.9 (ref 1.2–2.2)
Albumin: 4.4 g/dL (ref 3.8–4.8)
Alkaline Phosphatase: 110 IU/L (ref 44–121)
BUN/Creatinine Ratio: 16 (ref 10–24)
BUN: 21 mg/dL (ref 8–27)
Bilirubin Total: 0.6 mg/dL (ref 0.0–1.2)
CO2: 27 mmol/L (ref 20–29)
Calcium: 10 mg/dL (ref 8.6–10.2)
Chloride: 95 mmol/L — ABNORMAL LOW (ref 96–106)
Creatinine, Ser: 1.32 mg/dL — ABNORMAL HIGH (ref 0.76–1.27)
Globulin, Total: 2.3 g/dL (ref 1.5–4.5)
Glucose: 96 mg/dL (ref 70–99)
Potassium: 4.4 mmol/L (ref 3.5–5.2)
Sodium: 136 mmol/L (ref 134–144)
Total Protein: 6.7 g/dL (ref 6.0–8.5)
eGFR: 56 mL/min/{1.73_m2} — ABNORMAL LOW (ref >59)

## 2022-04-19 LAB — CBC

## 2022-04-20 LAB — CBC
Hematocrit: 41.5 % (ref 37.5–51.0)
Hemoglobin: 14.9 g/dL (ref 13.0–17.7)
MCH: 35.2 pg — ABNORMAL HIGH (ref 26.6–33.0)
MCHC: 35.9 g/dL — ABNORMAL HIGH (ref 31.5–35.7)
MCV: 98 fL — ABNORMAL HIGH (ref 79–97)
Platelets: 233 10*3/uL (ref 150–450)
RBC: 4.23 x10E6/uL (ref 4.14–5.80)
RDW: 12.9 % (ref 11.6–15.4)
WBC: 7.3 10*3/uL (ref 3.4–10.8)

## 2022-04-20 LAB — LIPID PANEL
Chol/HDL Ratio: 2.6 ratio (ref 0.0–5.0)
Cholesterol, Total: 100 mg/dL (ref 100–199)
HDL: 38 mg/dL — ABNORMAL LOW (ref >39)
LDL Chol Calc (NIH): 37 mg/dL (ref 0–99)
Triglycerides: 148 mg/dL (ref 0–149)
VLDL Cholesterol Cal: 25 mg/dL (ref 5–40)

## 2022-04-20 LAB — TSH: TSH: 2.64 u[IU]/mL (ref 0.450–4.500)

## 2022-04-26 ENCOUNTER — Other Ambulatory Visit (INDEPENDENT_AMBULATORY_CARE_PROVIDER_SITE_OTHER): Payer: PPO

## 2022-04-26 ENCOUNTER — Encounter: Payer: Self-pay | Admitting: Physician Assistant

## 2022-04-26 ENCOUNTER — Ambulatory Visit (INDEPENDENT_AMBULATORY_CARE_PROVIDER_SITE_OTHER): Payer: PPO | Admitting: Physician Assistant

## 2022-04-26 DIAGNOSIS — M25552 Pain in left hip: Secondary | ICD-10-CM

## 2022-04-26 NOTE — Progress Notes (Signed)
Office Visit Note   Patient: Carl [name]           Date of Birth: 06-08-1945           MRN: [id number] Visit Date: 04/26/2022              Requested by: Amelia Jo, [place],  [place] [id number] PCP: Amelia Jo, PA  Chief Complaint  Patient presents with   Left Hip - Follow-up   Lower Back - Follow-up      HPI: Sael comes in today to follow-up on his posterior buttock pain on the left.  He has said the pain has been up and down but actually feels like he is improving.  Pain is focally around the left buttock does not have any hip pain.  Has taken oxycodone only very occasionally  Assessment & Plan: Visit Diagnoses:  1. Pain of left hip     Plan: He does have quite a bit of degenerative changes in his lumbar spine.  I do think the etiology of his posterior buttock pain is from this.  He is neurovascularly intact.  And he is improving.  We talked about first trying to learn some self-directed exercises with PT that he can use for core strengthening and back strengthening he is willing to do this.  If he had a return of painful symptoms would call me and I will order an MRI of his lumbar spine and follow-up for possible epidural steroid injections  Follow-Up Instructions: No follow-ups on file.   Ortho Exam  Patient is alert, oriented, no adenopathy, well-dressed, normal affect, normal respiratory effort. Examination he has no pain in his hips.  He has some focal pain to palpation in the in the buttock and lower spine.  No redness no erythema no signs of infection pain is slightly reproduced with straight leg raising on the left.  His strength is intact  Imaging: XR Lumbar Spine 2-3 Views  Result Date: 04/26/2022 2 view radiographs of his lumbar spine were obtained today.  He does have a slight listhesis at L4-5.  He has degenerative changes throughout the lumbar spine most prominent at L3-4 L4-5 and L5-S1 with slight listhesis.  No images are attached  to the encounter.  Labs: Lab Results  Component Value Date   HGBA1C 5.5 02/25/2022   HGBA1C 5.5 08/03/2020   HGBA1C 5.4 11/15/2017   REPTSTATUS 02/27/2022 FINAL 02/25/2022   CULT  02/25/2022    NO GROWTH Performed at [facility], [place] [address] [place], [place] [address]      Lab Results  Component Value Date   ALBUMIN 4.4 04/19/2022   ALBUMIN 4.2 12/02/2020   ALBUMIN 3.9 10/27/2020    Lab Results  Component Value Date   MG 2.3 10/28/2020   MG 2.3 10/29/2018   MG 2.2 11/19/2017   No results found for: "VD25OH"  No results found for: "PREALBUMIN"    Latest Ref Rng & Units 04/19/2022   10:40 AM 02/25/2022    1:30 PM 12/02/2020   10:02 PM  CBC EXTENDED  WBC 3.4 - 10.8 x10E3/uL 7.3  8.4  12.0   RBC 4.14 - 5.80 x10E6/uL 4.23  4.62  4.44   Hemoglobin 13.0 - 17.7 g/dL 14.9  15.2  14.2   HCT 37.5 - 51.0 % 41.5  44.8  41.3   Platelets 150 - 450 x10E3/uL 233  228  238   NEUT# 1.7 - 7.7 K/uL  7.9   Lymph# 0.7 - 4.0 K/uL   1.8      There is no height or weight on file to calculate BMI.  Orders:  Orders Placed This Encounter  Procedures   XR Lumbar Spine 2-3 Views   Ambulatory referral to Physical Therapy   No orders of the defined types were placed in this encounter.    Procedures: No procedures performed  Clinical Data: No additional findings.  ROS:  All other systems negative, except as noted in the HPI. Review of Systems  Objective: Vital Signs: There were no vitals taken for this visit.  Specialty Comments:  No specialty comments available.  PMFS History: Patient Active Problem List   Diagnosis Date Noted   S/P insertion of penile implant 03/08/2022   Acute on chronic diastolic CHF (congestive heart failure) ([place]) 10/27/2020   CAD (coronary artery disease) 10/27/2020   Hypothyroidism 10/27/2020   Paroxysmal atrial fibrillation ([place]) 10/27/2020   Benign hypertension 07/22/2020   Drug-induced hypothyroidism 07/22/2020   Glaucoma  07/22/2020   Impaired fasting glucose 07/22/2020   Insomnia 07/22/2020   Macrocytosis 07/22/2020   Microscopic hematuria 07/22/2020   OSA (obstructive sleep apnea) 07/22/2020   Personal history of transient ischemic attack (TIA), and cerebral infarction without residual deficits 07/22/2020   Presence of aortocoronary bypass graft 07/22/2020   Transient ischemic attack 07/22/2020   Unspecified abnormal finding in specimens from other organs, systems and tissues 07/22/2020   Status post total replacement of right hip 04/17/2020   Avascular necrosis of bone of right hip ([place]) 04/07/2020   Pain in right hip 02/11/2020   Low back pain 02/11/2020   Typical atrial flutter ([place])    Impacted cerumen of both ears 07/16/2018   TSH elevation 11/21/2017   Atrial fibrillation with rapid ventricular response (HCC)    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/16/2017   Atherosclerotic heart disease of native coronary artery without angina pectoris 11/15/2017   Abnormal cardiac CT angiography    RLQ abdominal pain 07/07/2015   Primary osteoarthritis of left knee 06/17/2014   S/P total knee replacement using cement 06/17/2014   Preoperative cardiovascular examination 04/19/2014   Mixed hyperlipidemia 04/19/2014   AP (abdominal pain) 01/16/2014   History of diverticulitis 01/16/2014   GERD 01/14/2009   ULCER-GASTRIC 01/14/2009   GASTRITIS 11/14/2008   DIVERTICULITIS OF COLON 11/13/2008   BLOOD IN STOOL 11/13/2008   ABDOMINAL PAIN 11/13/2008   PERSONAL HISTORY OF COLONIC POLYPS 11/13/2008   Past Medical History:  Diagnosis Date   Anxiety    takes Xanax daily as needed   Arthritis    CAD (coronary artery disease)    s/p CABG   Cataract    removed both eyes   Chronic diastolic CHF (congestive heart failure) ([place])    Depression    takes Celexa daily   Diverticulitis    Diverticulosis    Enlarged prostate    sightly   Esophagitis    Gastric ulcer    GERD (gastroesophageal reflux disease)    takes Protonix daily    Hx of adenomatous colonic polyps    benign   Hyperlipidemia    takes Zocor daily   Hypertension    Hypothyroidism    Insomnia    takes Ambien nightly   Joint pain    Joint swelling    OSA (obstructive sleep apnea)    Paroxysmal atrial fibrillation (HCC)    On Eliquis   Pre-diabetes     Family History  Problem Relation Age of  Onset   Colon cancer Paternal Grandfather        questionable   Colon polyps Neg Hx    Esophageal cancer Neg Hx    Rectal cancer Neg Hx    Stomach cancer Neg Hx     Past Surgical History:  Procedure Laterality Date   A-FLUTTER ABLATION N/A 01/11/2019   Procedure: A-FLUTTER ABLATION;  Surgeon: Evans Lance, MD;  Location: [place] [facility];  Service: Cardiovascular;  Laterality: N/A;   CARDIAC CATHETERIZATION     CARDIOVERSION N/A 11/20/2018   Procedure: CARDIOVERSION;  Surgeon: Troy Sine, MD;  Location: [facility];  Service: Cardiovascular;  Laterality: N/A;   cartliage removed from nose  48yrs ago   cataract surgery Bilateral    CERVICAL SPINE SURGERY     COLONOSCOPY     CORONARY ARTERY BYPASS GRAFT N/A 11/16/2017   Procedure: CORONARY ARTERY BYPASS GRAFTING (CABG) x4. LEFT ENDOSCOPIC SAPHENOUS VEIN HARVEST AND MAMMARY ARTERY TAKE DOWN. LIMA TO LAD, SVG TO PDA, SVG TO DISTAL CIRC & OMI.;  Surgeon: [name], MD;  Location: [place];  Service: Open Heart Surgery;  Laterality: N/A;   EYE SURGERY     FOREIGN BODY REMOVAL ESOPHAGEAL     KNEE SURGERY Left    couple of times   LEFT HEART CATH AND CORONARY ANGIOGRAPHY N/A 11/15/2017   Procedure: LEFT HEART CATH AND CORONARY ANGIOGRAPHY;  Surgeon: Troy Sine, MD;  Location: [place] [facility];  Service: Cardiovascular;  Laterality: N/A;   NASAL SINUS SURGERY     PENILE PROSTHESIS IMPLANT N/A 03/08/2022   Procedure: INSERTION OF INFLATABLE PENILE IMPLANT;  Surgeon: Vira Agar, MD;  [facility];  Service: Urology;  Laterality: N/A;  90 MINUTES NEEDED FOR CASE   POLYPECTOMY      TEE WITHOUT CARDIOVERSION N/A 11/16/2017   Procedure: TRANSESOPHAGEAL ECHOCARDIOGRAM (TEE);  Surgeon: [name], MD;  Location: [place];  Service: Open Heart Surgery;  Laterality: N/A;   TONSILLECTOMY     TOTAL HIP ARTHROPLASTY Right 04/17/2020   Procedure: RIGHT TOTAL HIP ARTHROPLASTY ANTERIOR APPROACH;  Surgeon: Mcarthur Rossetti, MD;  [facility];  Service: Orthopedics;  Laterality: Right;   TOTAL KNEE ARTHROPLASTY Left 06/17/2014   Procedure: TOTAL KNEE ARTHROPLASTY;  Surgeon: Garald Balding, MD;  Location: [facility];  Service: Orthopedics;  Laterality: Left;   Social History   Occupational History   Occupation: Owner  Tobacco Use   Smoking status: Never   Smokeless tobacco: Never  Vaping Use   Vaping Use: Never used  Substance and Sexual Activity   Alcohol use: Yes    Alcohol/week: 10.0 standard drinks of alcohol    Types: 10 Standard drinks or equivalent per week    Comment: 2 drinks 5 nights per week   Drug use: No   Sexual activity: Never

## 2022-04-29 ENCOUNTER — Other Ambulatory Visit: Payer: Self-pay | Admitting: *Deleted

## 2022-04-29 MED ORDER — LEVOTHYROXINE SODIUM 150 MCG PO TABS: ORAL_TABLET | ORAL | 0 refills | Status: DC

## 2022-05-05 DIAGNOSIS — T8579XS Infection and inflammatory reaction due to other internal prosthetic devices, implants and grafts, sequela: Secondary | ICD-10-CM | POA: Diagnosis not present

## 2022-05-05 DIAGNOSIS — H40051 Ocular hypertension, right eye: Secondary | ICD-10-CM | POA: Diagnosis not present

## 2022-05-11 ENCOUNTER — Ambulatory Visit: Payer: PPO | Admitting: Physical Therapy

## 2022-05-30 ENCOUNTER — Ambulatory Visit: Payer: PPO | Admitting: Physical Therapy

## 2022-05-30 NOTE — Therapy (Deleted)
OUTPATIENT PHYSICAL THERAPY LOWER EXTREMITY EVALUATION   Patient Name: Carl [name] MRN: [id number] DOB:12/25/1945, 77 y.o., male Today's Date: 05/30/2022  END OF SESSION:   Past Medical History:  Diagnosis Date   Anxiety    takes Xanax daily as needed   Arthritis    CAD (coronary artery disease)    s/p CABG   Cataract    removed both eyes   Chronic diastolic CHF (congestive heart failure) (HCC)    Depression    takes Celexa daily   Diverticulitis    Diverticulosis    Enlarged prostate    sightly   Esophagitis    Gastric ulcer    GERD (gastroesophageal reflux disease)    takes Protonix daily   Hx of adenomatous colonic polyps    benign   Hyperlipidemia    takes Zocor daily   Hypertension    Hypothyroidism    Insomnia    takes Ambien nightly   Joint pain    Joint swelling    OSA (obstructive sleep apnea)    Paroxysmal atrial fibrillation (HCC)    On Eliquis   Pre-diabetes    Past Surgical History:  Procedure Laterality Date   A-FLUTTER ABLATION N/A 01/11/2019   Procedure: A-FLUTTER ABLATION;  Surgeon: Marinus Maw, MD;  Location: [facility];  Service: Cardiovascular;  Laterality: N/A;   CARDIAC CATHETERIZATION     CARDIOVERSION N/A 11/20/2018   Procedure: CARDIOVERSION;  Surgeon: Lennette Bihari, MD;  Location: [facility] ENDOSCOPY;  Service: Cardiovascular;  Laterality: N/A;   cartliage removed from nose  26yrs ago   cataract surgery Bilateral    CERVICAL SPINE SURGERY     COLONOSCOPY     CORONARY ARTERY BYPASS GRAFT N/A 11/16/2017   Procedure: CORONARY ARTERY BYPASS GRAFTING (CABG) x4. LEFT ENDOSCOPIC SAPHENOUS VEIN HARVEST AND MAMMARY ARTERY TAKE DOWN. LIMA TO LAD, SVG TO PDA, SVG TO DISTAL CIRC & OMI.;  Surgeon: Delight Ovens, MD;  Location: [facility] OR;  Service: Open Heart Surgery;  Laterality: N/A;   EYE SURGERY     FOREIGN BODY REMOVAL ESOPHAGEAL     KNEE SURGERY Left    couple of times   LEFT HEART CATH AND CORONARY ANGIOGRAPHY N/A 11/15/2017    Procedure: LEFT HEART CATH AND CORONARY ANGIOGRAPHY;  Surgeon: Lennette Bihari, MD;  Location: [facility];  Service: Cardiovascular;  Laterality: N/A;   NASAL SINUS SURGERY     PENILE PROSTHESIS IMPLANT N/A 03/08/2022   Procedure: INSERTION OF INFLATABLE PENILE IMPLANT;  Surgeon: Despina Arias, MD;  Location: [facility] ORS;  Service: Urology;  Laterality: N/A;  90 MINUTES NEEDED FOR CASE   POLYPECTOMY     TEE WITHOUT CARDIOVERSION N/A 11/16/2017   Procedure: TRANSESOPHAGEAL ECHOCARDIOGRAM (TEE);  Surgeon: Delight Ovens, MD;  Location: [facility] OR;  Service: Open Heart Surgery;  Laterality: N/A;   TONSILLECTOMY     TOTAL HIP ARTHROPLASTY Right 04/17/2020   Procedure: RIGHT TOTAL HIP ARTHROPLASTY ANTERIOR APPROACH;  Surgeon: Kathryne Hitch, MD;  Location: [facility] ORS;  Service: Orthopedics;  Laterality: Right;   TOTAL KNEE ARTHROPLASTY Left 06/17/2014   Procedure: TOTAL KNEE ARTHROPLASTY;  Surgeon: Valeria Batman, MD;  Location: [facility] OR;  Service: Orthopedics;  Laterality: Left;   Patient Active Problem List   Diagnosis Date Noted   S/P insertion of penile implant 03/08/2022   Acute on chronic diastolic CHF (congestive heart failure) 10/27/2020   CAD (coronary artery disease) 10/27/2020   Hypothyroidism 10/27/2020   Paroxysmal  atrial fibrillation 10/27/2020   Benign hypertension 07/22/2020   Drug-induced hypothyroidism 07/22/2020   Glaucoma 07/22/2020   Impaired fasting glucose 07/22/2020   Insomnia 07/22/2020   Macrocytosis 07/22/2020   Microscopic hematuria 07/22/2020   OSA (obstructive sleep apnea) 07/22/2020   Personal history of transient ischemic attack (TIA), and cerebral infarction without residual deficits 07/22/2020   Presence of aortocoronary bypass graft 07/22/2020   Transient ischemic attack 07/22/2020   Unspecified abnormal finding in specimens from other organs, systems and tissues 07/22/2020   Status post total replacement of right hip 04/17/2020   Avascular  necrosis of bone of right hip 04/07/2020   Pain in right hip 02/11/2020   Low back pain 02/11/2020   Typical atrial flutter    Impacted cerumen of both ears 07/16/2018   TSH elevation 11/21/2017   Atrial fibrillation with rapid ventricular response    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/16/2017   Atherosclerotic heart disease of native coronary artery without angina pectoris 11/15/2017   Abnormal cardiac CT angiography    RLQ abdominal pain 07/07/2015   Primary osteoarthritis of left knee 06/17/2014   S/P total knee replacement using cement 06/17/2014   Preoperative cardiovascular examination 04/19/2014   Mixed hyperlipidemia 04/19/2014   AP (abdominal pain) 01/16/2014   History of diverticulitis 01/16/2014   GERD 01/14/2009   ULCER-GASTRIC 01/14/2009   GASTRITIS 11/14/2008   DIVERTICULITIS OF COLON 11/13/2008   BLOOD IN STOOL 11/13/2008   ABDOMINAL PAIN 11/13/2008   PERSONAL HISTORY OF COLONIC POLYPS 11/13/2008    PCP: Carin Hock  REFERRING PROVIDER: Persons, [place], [place]  REFERRING DIAG: [phone] (ICD-10-CM) - Pain of left hip  THERAPY DIAG:  No diagnosis found.  Rationale for Evaluation and Treatment: Rehabilitation  ONSET DATE: ***  SUBJECTIVE:   SUBJECTIVE STATEMENT: ***  PERTINENT HISTORY: *** PAIN:  Are you having pain? {OPRCPAIN:27236}  PRECAUTIONS: {Therapy precautions:24002}  WEIGHT BEARING RESTRICTIONS: {Yes ***/No:24003}  FALLS:  Has patient fallen in last 6 months? {fallsyesno:27318}  LIVING ENVIRONMENT: Lives with: {OPRC lives with:25569::"lives with their family"} Lives in: {Lives in:25570} Stairs: {opstairs:27293} Has following equipment at home: {Assistive devices:23999}  OCCUPATION: ***  PLOF: {PLOF:24004}  PATIENT GOALS: ***  NEXT MD VISIT: ***  OBJECTIVE:   DIAGNOSTIC FINDINGS: 04/26/22 2 view radiographs of his lumbar spine were obtained today.  He does have  a slight listhesis at L4-5.  He has degenerative changes throughout the  lumbar  spine most prominent at L3-4 L4-5 and L5-S1 with slight listhesis.  2 view radiographs of his left hip demonstrate well-maintained alignment.   He does have some degenerative changes with sclerotic changes inferior  osteophytes.  No evidence of dislocation very similar x-rays to those  taken a year and a half ago.  He is status post right hip replacement.  No  evidence of a pelvic fracture   PATIENT SURVEYS:  FOTO ***  COGNITION: Overall cognitive status: {cognition:24006}     SENSATION: {sensation:27233}  EDEMA:  {edema:24020}  MUSCLE LENGTH: Hamstrings: Right *** deg; Left *** deg Maisie Fus test: Right *** deg; Left *** deg  POSTURE: {posture:25561}  PALPATION: ***  LOWER EXTREMITY ROM:  {AROM/PROM:27142} ROM Right eval Left eval  Hip flexion    Hip extension    Hip abduction    Hip adduction    Hip internal rotation    Hip external rotation    Knee flexion    Knee extension    Ankle dorsiflexion    Ankle plantarflexion    Ankle inversion  Ankle eversion     (Blank rows = not tested)  LOWER EXTREMITY MMT:  MMT Right eval Left eval  Hip flexion    Hip extension    Hip abduction    Hip adduction    Hip internal rotation    Hip external rotation    Knee flexion    Knee extension    Ankle dorsiflexion    Ankle plantarflexion    Ankle inversion    Ankle eversion     (Blank rows = not tested)  LOWER EXTREMITY SPECIAL TESTS:  {LEspecialtests:26242}  FUNCTIONAL TESTS:  {Functional tests:24029}  GAIT: Distance walked: *** Assistive device utilized: {Assistive devices:23999} Level of assistance: {Levels of assistance:24026} Comments: ***   TODAY'S TREATMENT:                                                                                                                              DATE: ***    PATIENT EDUCATION:  Education details: *** Person educated: {Person educated:25204} Education method: {Education Method:25205} Education  comprehension: {Education Comprehension:25206}  HOME EXERCISE PROGRAM: ***  ASSESSMENT:  CLINICAL IMPRESSION: Patient is a *** y.o. *** who was seen today for physical therapy evaluation and treatment for ***.   OBJECTIVE IMPAIRMENTS: {opptimpairments:25111}.   ACTIVITY LIMITATIONS: {activitylimitations:27494}  PARTICIPATION LIMITATIONS: {participationrestrictions:25113}  PERSONAL FACTORS: {Personal factors:25162} are also affecting patient's functional outcome.   REHAB POTENTIAL: {rehabpotential:25112}  CLINICAL DECISION MAKING: {clinical decision making:25114}  EVALUATION COMPLEXITY: {Evaluation complexity:25115}   GOALS: Goals reviewed with patient? {yes/no:20286}  SHORT TERM GOALS: Target date: *** *** Baseline: Goal status: {GOALSTATUS:25110}  2.  *** Baseline:  Goal status: {GOALSTATUS:25110}  3.  *** Baseline:  Goal status: {GOALSTATUS:25110}  4.  *** Baseline:  Goal status: {GOALSTATUS:25110}  5.  *** Baseline:  Goal status: {GOALSTATUS:25110}  6.  *** Baseline:  Goal status: {GOALSTATUS:25110}  LONG TERM GOALS: Target date: ***  *** Baseline:  Goal status: {GOALSTATUS:25110}  2.  *** Baseline:  Goal status: {GOALSTATUS:25110}  3.  *** Baseline:  Goal status: {GOALSTATUS:25110}  4.  *** Baseline:  Goal status: {GOALSTATUS:25110}  5.  *** Baseline:  Goal status: {GOALSTATUS:25110}  6.  *** Baseline:  Goal status: {GOALSTATUS:25110}   PLAN:  PT FREQUENCY: {rehab frequency:25116}  PT DURATION: {rehab duration:25117}  PLANNED INTERVENTIONS: {rehab planned interventions:25118::"Therapeutic exercises","Therapeutic activity","Neuromuscular re-education","Balance training","Gait training","Patient/Family education","Self Care","Joint mobilization"}  PLAN FOR NEXT SESSION: ***   Rhylen Shaheen April Ma L Kipton Skillen, PT 05/30/2022, 12:50 PM

## 2022-06-22 ENCOUNTER — Ambulatory Visit: Payer: PPO | Attending: Cardiovascular Disease | Admitting: Cardiovascular Disease

## 2022-06-22 ENCOUNTER — Encounter: Payer: Self-pay | Admitting: Cardiovascular Disease

## 2022-06-22 VITALS — BP 138/98 | HR 69 | Ht 67.5 in | Wt 200.0 lb

## 2022-06-22 DIAGNOSIS — I2510 Atherosclerotic heart disease of native coronary artery without angina pectoris: Secondary | ICD-10-CM | POA: Diagnosis not present

## 2022-06-22 DIAGNOSIS — Z951 Presence of aortocoronary bypass graft: Secondary | ICD-10-CM | POA: Diagnosis not present

## 2022-06-22 DIAGNOSIS — E782 Mixed hyperlipidemia: Secondary | ICD-10-CM | POA: Diagnosis not present

## 2022-06-22 DIAGNOSIS — Z8679 Personal history of other diseases of the circulatory system: Secondary | ICD-10-CM

## 2022-06-22 DIAGNOSIS — E039 Hypothyroidism, unspecified: Secondary | ICD-10-CM

## 2022-06-22 DIAGNOSIS — Z9889 Other specified postprocedural states: Secondary | ICD-10-CM

## 2022-06-22 DIAGNOSIS — Z7984 Long term (current) use of oral hypoglycemic drugs: Secondary | ICD-10-CM

## 2022-06-22 DIAGNOSIS — G4733 Obstructive sleep apnea (adult) (pediatric): Secondary | ICD-10-CM

## 2022-06-22 DIAGNOSIS — E118 Type 2 diabetes mellitus with unspecified complications: Secondary | ICD-10-CM

## 2022-06-22 MED ORDER — NITROGLYCERIN 0.4 MG SL SUBL: SUBLINGUAL_TABLET | SUBLINGUAL | 1 refills | Status: DC

## 2022-06-22 NOTE — Patient Instructions (Addendum)
Medication Instructions:  No changes *If you need a refill on your cardiac medications before your next appointment, please call your pharmacy*  Follow-Up: At [facility], you and your health needs are our priority.  As part of our continuing mission to provide you with exceptional heart care, we have created designated Provider Care Teams.  These Care Teams include your primary Cardiologist (physician) and Advanced Practice Providers (APPs -  Physician Assistants and Nurse Practitioners) who all work together to provide you with the care you need, when you need it.  We recommend signing up for the patient portal called "MyChart".  Sign up information is provided on this After Visit Summary.  MyChart is used to connect with patients for Virtual Visits (Telemedicine).  Patients are able to view lab/test results, encounter notes, upcoming appointments, etc.  Non-urgent messages can be sent to your provider as well.   To learn more about what you can do with MyChart, go to [email or web address].    Your next appointment:   6 month(s)= 12/23/22 at 1:20 pm  Provider:   Nicki Guadalajara, MD

## 2022-06-22 NOTE — Progress Notes (Signed)
Cardiology Office Note    Date:  06/22/2022   ID:  Carl [name], DOB Jun 12, 1945, MRN [id number]  PCP:  Carin Hock, PA  Cardiologist:  Nicki Guadalajara, MD   3 month follow-up evaluation   History of Present Illness:  Carl [name] is a 77 y.o. male who presents to the office today for 3 month follow-up evaluation.    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  In 2009 he developed transient numbness of his right face which lasted for several minutes and at that time apparently underwent evaluation and had a normal MRI, MRA, and carotid duplex studies.  He was found to have only mild to moderate plaque in the proximal right internal carotid artery without stenosis.  I had seen him in March 2016 for preoperative cardiology clearance prior to undergoing left knee replacement surgery by Dr. Cleophas Dunker.  Preoperative ECG suggested possible junctional rhythm which was new from an ECG of 2012 which previously had only shown sinus bradycardia.  He was asymptomatic.  When I saw him, his ECG showed sinus rhythm at 61 bpm with normal intervals and on that ECG P waves were normal and upright inferiorly.  Carl [name] has remained fairly active. In 2019  he had not been exercising as much as he had in the past still working out with a trainer and denied any associated chest pain or palpitations.  He in September 201 19 he began to notice more shortness of breath with walking up steps.  He was  at a friend's house and was told that he appeared to be more short of breath than previously.  I saw him for evaluation of his exertional shortness of breath on October 24, 2017.  At that time, I recommended that he undergo a 2D echo Doppler study and scheduled him for coronary CT angiography.  His echo Doppler study demonstrated hyperdynamic LV function with an EF of 65 to 70%.  Doppler parameters suggest grade 2 diastolic dysfunction and elevated  ventricular end-diastolic filling pressure.  He had mild MR, mild TR, and mild dilation of his left atrium.  Pulmonary pressures were normal.  CT coronary angiography was performed on November 09, 2017.  This was abnormal and demonstrated an elevated calcium score a 25 which is 78th percentile for age and sex.  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  There was greater than 75% calcific plaque in his proximal and mid diagonal 1 vessel.  The circumflex had 50% proximal plaque.  There was 50 to 75% mixed plaque in the OM 2 vessel less than 50% in the OM1 vessel.  His RCA had 50% calcified plaque proximally, 50 to 75% calcified plaque in the mid vessel.  There was mild aortic root dilatation at 4.1 cm.  His CT images were referred for [facility] analysis which were positive in the mid RCA 0.78, the mid LAD 0.78 and in the AV groove circumflex at 0.71.   I saw him in follow-up of the above studies and recommended cardiac catheterization.  Catheterization was done in November 15, 2017 which showed severe multivessel CAD with 85% ostial LAD stenosis, diffuse 75% mid LAD stenosis, total occlusion of the mid AV groove circumflex after the takeoff of the second obtuse marginal vessel with 50% diffuse stenosis in the first  large marginal branch, 80 and 90% stenoses in the second obtuse marginal vessel and extensive collateralization to the distal circumflex and third marginal via the LAD.  He had 1650% mid RCA stenoses and a dominant RCA.  Of note, he had probable high right radial artery takeoff with spasm/coarse stenosis above the elbow and there was retrograde filling of the brachial artery and the catheterization was transition to the femoral approach.  He underwent successful CABG revascularization surgery the following day by Dr. Lowella Fairy on November 16, 2017 with a LIMA graft to his LAD, SVG to first  marginal and distal circumflex, and SVG to the PDA of his RCA with left thigh and calf endoscopic vein harvest.  He developed atrial fibrillation with RVR on postop day 2 and was started on IV amiodarone.  He ultimately converted back to sinus rhythm but developed recurrent AF on postop day 4 on metoprolol and amiodarone and ultimately converted back to sinus rhythm.  He was started on Eliquis on day 5 for anticoagulation.  He was discharged on November 22, 2017.  Approximately 10 days later he started to develop left lower extremity swelling and redness with pain in the back of his calf.  He was evaluated by Lowella Dandy, PA-C at [facility] on December 04, 2017.  There was no sign of infection.  Venous duplex imaging did not reveal any DVT.  He saw Dr. Tyrone Sage for initial follow-up evaluation on December 14, 2017 at which time he was maintaining sinus rhythm.  He will be participating in cardiac rehab and his orientation is scheduled for February 15, 2018.  He denied any recurrent chest pain.  His breathing has improved with walking.  He went to the beach and was walking at least a mile per day.  He is sleeping better and snoring is less.    Since his November 2019 and prior to his February 2020 evaluation he was doing well and was  participating in cardiac rehabilitation and also had joined a gym. He was exercising regularly.  He denied chest pain, PND, or orthopnea.  Subsequent laboratory showed further TSH elevation.  His levothyroxine was increased to 75 mcg.  Amiodarone was discontinued due to LFT elevation and his dose of rosuvastatin was reduced to one half of the current dose.  I  saw him in February 2020 at that time he had not had follow-up laboratory.    He had had follow-up laboratory which continued to show hypothyroidism and his dose of levothyroxine has gradually been increased.  Recent laboratory done by Dr. Clarene Duke several months ago continues to show a TSH was elevated and his levothyroxine dose was  increased from 100 to 125 mcg.  During the COVID-19 pandemic, he admits to weight gain.  He has not been exercising as much as he had in the past.  Although he felt well, when recently seen by Dr. Cleophas Dunker he was felt to potentially be more short of breath.  He was advised to see me back for follow-up evaluation.  I scheduled him to undergo repeat laboratory which was done on October 05, 2018.  Hemoglobin 15.4, hematocrit 44.2.  Lipid studies were significantly improved from 6 months previously with total cholesterol now 143, triglycerides 142, HDL 48, LDL 67.  Brain natruretic peptide was 131.  TSH had significantly improved but was still elevated at 15.9.  He had normal renal function with a BUN of 15 creatinine 0.96.  LFTs were significantly improved but minimally increased with an AST of 46  and ALT at 49.  He denied any chest pain or awareness of palpitations.  I saw him for reevaluation on October 08, 2018.  During that evaluation, I reviewed his laboratory and felt he was euvolemic.  His ECG showed normal sinus rhythm at 60 bpm with an isolated PVC.  His QTc interval was 434 ms.  With his continued TSH elevation, levothyroxine was further titrated to 150 mcg.  Subsequently, Carl [name] has continued to feel well.  He denied any awareness of palpitations.  Last week, in October 29, 2018 when checking his Apple watch he saw report that his heart rate was increased in the upper 130s.  He was unaware of this irregularity.  Upon further questioning, he had to go out of town over the last several days and believes he may have missed taking his morning metoprolol dose on Friday and Saturday and he could not remember if he had taken it yesterday morning.  He was supposed to be taken metoprolol tartrate 25 mg twice a day, HCTZ on a as needed basis which he has not needed.  He continues to be on Eliquis 5 mg twice a day and is tolerated the levothyroxine 150 mcg dose.  He is now on the vascepa which was just renewed last  week 2 capsules twice a day, rosuvastatin 20 mg daily for mixed hyperlipidemia.  After receiving his phone call, I advised that he come to the office to check an EKG.  With his ECG showing atrial flutter at a rate of 130 he was added onto my schedule for further evaluation.  During that evaluation, he was asymptomatic and felt well.  His ECG showed atrial flutter with variable block at 130 bpm with nonspecific ST-T changes.  He was on Eliquis for anticoagulation.  I recommended titration of metoprolol tartrate to 50 mg twice a day.  Laboratory drawn that day revealed a TSH 5.95, magnesium 2.3, potassium 4.7, BUN 14 creatinine 0.9.   I saw him on November 05, 2018 for follow-up evaluation.  He was feeling well and continue to be unaware of his heart rate irregularity.  However according to his Apple Watch, his heart rate was running in the 115-120 range   He denied chest pain.  He denied tremors.  He was sleeping well and denied any awareness of sleep apnea.  During that evaluation we discussed gradually him for DC cardioversion but this would require COVID-19 testing with quarantine for 3 days prior to having the procedure done.  His daughter was coming into town that weekend.  As result we further titrated his metoprolol to 75 mg twice a day which he did for 4 days and then increase to 200 mg twice a day.  He continues to be on Eliquis for anticoagulation.  He admits to increased fatigue but denies chest pain.   I saw Carl [name] in follow-up on November 15, 2018.  At that time he had titrated the metoprolol up to 100 mg twice a day.  He underwent successful DC cardioversion on November 20, 2018 with restoration of sinus rhythm.  Of note, he developed hypotension prior to awakening from propofol and received normal saline and Neo-Synephrine.  Blood pressure normalized loud snoring was noted, and it was advised to consider sleep study evaluation.  Over the month after his cardioversion Carl [name] initially did well but he  inadvertently reduced his metoprolol from I suggested dose reduction to 75 mg twice a day and apparently has only been taking 25 mg twice a day.  In addition, he has had difficulty with sinus congestion and was taking medication with pseudoephedrine.  He called the office on 12/20/18  stating that his Apple Watch had noted that his heart rate had been increased for almost a week and when I spoke with him his resting pulse was 135.  At that time I recommended he increase metoprolol to 50 mg twice a day and see me on the following day on December 21, 2018.  During that evaluation, he remained in atrial flutter with variable block at a rate of 115 bpm.  Recommended titration of metoprolol tartrate to 100 mg twice a day.  I discussed EP evaluation with consideration for atrial flutter ablation.  I again will had a lengthy discussion regarding the need for a sleep study due to high likelihood of obstructive sleep apnea.  He initially preferred trying the metoprolol increased dose to see if this would benefit him prior to an immediate EP evaluation.   I saw him in follow-up on the increase metoprolol 100 mg twice a day dosing at that time he was in 2-1 flutter with ventricular rate at 130.  I strongly recommended EP evaluation with atrial flutter ablation and discussed the case with Dr. Ladona Ridgel who agreed to see the patient in the office on December 3 and plan to do atrial flutter ablation on December 4.  Since his sleep study was tentatively scheduled for December 2,  I recommended that this be deferred until after his ablation.   He underwent successful ablation of his atrial flutter on January 11, 2019 which was isthmus-dependent right atrial flutter upon presentation to the EP lab.  He was able to be successfully treated and was discharged later that evening in sinus rhythm.  I saw him on January 16, 2019 after he called the office the day previously complaining of some shortness of breath.  He again called the  office this morning stating that he was more short of breath, very fatigued and having no energy.  As result I added him onto my schedule  for further evaluation.  He denied any chest pain and was unaware of any palpitations; per his apple watch monitoring his resting pulse yesterday was 56 and today 57.  During that evaluation, I recommended that he undergo a chest x-ray, follow-up 2D echo Doppler study, and scheduled him for Lexiscan Myoview study to make certain his dyspnea was not ischemia mediated.  His echo Doppler study showed EF at 60 to 65% with mild LVH.  There was abnormal septal motion consistent with his postoperative state.  There was grade 2 diastolic dysfunction.  He had severe biatrial enlargement.  A chest x-ray did not reveal any active disease.  A Lexiscan Myoview study was low risk without ischemia with nuclear stress EF at 60%.  He underwent his sleep study on January 25, 2019.  Apparently several days prior to his sleep study he apparently again ran out of his metoprolol.  Of note during the sleep study his ECG revealed that he was in atrial fibrillation.  He was found to have severe obstructive sleep apnea in a split-night protocol with an AHI of 63.5/h.  CPAP was initiated but due to continued events was transitioned to BiPAP therapy and he was ultimately titrated to 18/14.  AHI at this level was 3.6 with oxygen nadir 93%.    I saw him for follow-up on February 14, 2019.  At that time he had resumed metoprolol.  His set up date for BiPAP  was February 04, 2019.  Over the past week and a half on therapy he has noticed significant improvement in his previous fatigability.  A download was obtained since his set up date and he was 100% compliant since December 28.  He had a mask leak.  At 18/14 BiPAP pressure AHI was improved but still increased at 6.3.  There were no central events.    Jshawn was reevaluated by Dr. Ladona Ridgel on February 19, 2019.  He was doing well and maintaining sinus rhythm.   Apparently was recommended that he reduce his metoprolol to 50 twice a day but apparently this has not been done.  I saw him on March 18 for his initial initial sleep clinic evaluation.  Of note, a download from January 8 through March 05, 2019 shows that he is meeting compliance standards with 84% of usage days and averaging 6 hours and 20 minutes.  AHI was 7.9 and his BiPAP device was set at a minimum EPAP of 12 with maximum IPAP of 22.  Apparently over the past month, Abi states that he has lost 10 pounds by essentially giving up bread and reducing carbohydrates.  He believes with his weight loss he was not needing CPAP.  He had issues with mask leak and apparently went to get a new mask which was a ResMed air fit F 30i from [facility].  Apparently, he never adjusted the straps to fit his head and as result this resulted in significant leakage and consequently for the last 30 days he has only used CPAP for 5 days and only for 2 hours and 6 minutes on average.  AHI was 9.6 /h.  During that evaluation I had a lengthy discussion with him again stressing the importance of increased incidence of recurrent atrial fibrillation with his severe sleep apnea if left untreated.  He was not wearing his mask appropriately and we spent significant time education and instruction on how to place the mask on to fit well without significant leak.  At that time I reduced his metoprolol down to 75 mg in the morning and 50 mg at night.  I saw him in June 2021 during the prior several months he had felt well.  Over the past several months he has felt well.  Again there were times where he was not typically compliant with BiPAP use but over the past several weeks prior to his evaluation he had been using it more regularly and has noticed more energy.  Several weeks ago when he was not using it he did experience significant fatigability.  He is unaware of any breakthrough atrial fibrillation or flutter.  He denies  recurrent anginal symptoms.  I obtained a new download  from May 25 through July 31, 2019.  This revealed reduced compliance with 19 out of 30 days but over the past several weeks he has been much more consistent with CPAP use and only missed 2 days.  At his minimum EPAP pressure of 10 and maximum pressure of 25 AHI was increased at 11.  His 95th percentile pressure was 17.1/13.1 with a maximum average pressure 18.1/14.1.  When I saw Carl [name] on March 12, 2020 he was no longer dating his prior girlfriend.  When they were together she was playing a major role in his BiPAP compliance .  Apparently, Aaryn stopped using BiPAP therapy and since September 2021 has only used on several occasions with approximately 8 days in October 1 day November and 1 day in December.  He was unaware of any breakthrough palpitations or atrial flutter.  He denies chest pain or shortness of breath.  He denies any bleeding on Eliquis.  He admits to weight gain.  He is resuming exercise.  He would still like to undergo surgery to his right eyelid which continuously droops and inhibits his breathing capabilities.   I last saw him on August 03, 2020.  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  Surgery was done by Dr. Magnus Ivan.  Carl [name] tolerated surgery well without cardiovascular issues.  Several weeks ago, he began to notice significantly increased fatigability.  He was no longer using BiPAP.  He went to [facility] to his beach house for a week and ultimately has felt significantly improved.  He also is now dating another woman and he believes this has significantly invigorated him.  He is unaware of any recurrent anginal symptoms.  He is now been on over-the-counter omega-3 fatty acid instead of his previous Vascepa.  He continues to be on Eliquis for anticoagulation without bleeding and continues to  take low-dose aspirin with his CAD.  He is on levothyroxine 150 mcg for hypothyroidism, metoprolol 50 mg twice a day and is unaware of any recurrent atrial flutter or fibrillation.  He is on rosuvastatin 20 mg daily.    Since I last saw Mad[place], he was hospitalized in September 2022 with acute on chronic diastolic heart failure.  He was diuresed and discharged on daily Lasix.  He was subsequently seen by Joni Reining, NP in September 2022 and Carl [name], [facility] in January 2023.  He was recently given clearance to undergo urologic surgery with insertion of inflatable penile implant which was done on March 08, 2022 by Dr.Graham Machen.  Eliquis was held for 2 days.  He tolerated surgery well.  I last saw him on March 21, 2022. Carl [name] had noticed some low blood pressure and heart rate.  Apparently, he had inadvertently gone back to taking metoprolol tartrate 50 mg twice a day instead of one half a pill or 25 mg twice daily.  He has continued to be on aspirin 81 mg in addition to Eliquis with his CAD.  He has been taking furosemide 40 mg twice a day for swelling, lisinopril 10 mg daily, supplemental potassium.  He continues to be on levothyroxine 150 mcg for hypothyroidism.  He continues to take rosuvastatin 40 mg in addition to Vascepa 2 capsules twice a day for hyperlipidemia.  He is diabetic on metformin and Ozempic weekly.  He is no longer using BiPAP.  He continues to be on Eliquis 5 mg twice a day.  He had recently called our office for a renewal of his furosemide and since I had not seen him since June 2022 he was scheduled for follow-up evaluation.  During that evaluation, his heart rate was slow at 49 and blood pressure was 100/58 supine and 100/60 standing.  I recommended he decrease metoprolol to tartrate back to 25 mg twice a day and also decrease his furosemide from 40 mg twice a day to 40 mg in the morning and 20 mg at night with the possibility of discontinuing the evening dose depending upon  blood pressure and leg swelling.    Since I last saw him, Carl [name] has remained relatively stable.  Apparently he had lost a bottle of sublingual nitroglycerin and will need renewal.  He also tells me he just ran out of one of his medications but he could not remember which 1 and he will need to go home and check and call our office so that this can be renewed.  He denies any chest pain or shortness of breath.  His resting pulse has been stable on the reduced dose of metoprolol.  Had undergone laboratory on April 19, 2022 which showed total cholesterol 100, triglycerides 148, HDL 38, and LDL cholesterol 37.  He be on rosuvastatin 40 mg and Zetia 10 mg daily in addition to Vascepa 2 capsules twice a day.  Denies significant leg swelling on furosemide 40 mg in morning and 20 mg at night and continues to be on lisinopril 10 mg daily metoprolol to tartrate 25 mg twice a day.  He is diabetic on metformin, Ozempic.  He presents for evaluation.    Past Medical History:  Diagnosis Date   Anxiety    takes Xanax daily as needed   Arthritis    CAD (coronary artery disease)    s/p CABG   Cataract    removed both eyes   Chronic diastolic CHF (congestive heart failure) (HCC)    Depression    takes Celexa daily   Diverticulitis    Diverticulosis    Enlarged prostate    sightly   Esophagitis    Gastric ulcer    GERD (gastroesophageal reflux disease)    takes Protonix daily   Hx of adenomatous colonic polyps    benign   Hyperlipidemia    takes Zocor daily   Hypertension    Hypothyroidism    Insomnia    takes Ambien nightly   Joint pain    Joint swelling    OSA (obstructive sleep apnea)    Paroxysmal atrial fibrillation (HCC)    On Eliquis   Pre-diabetes     Past Surgical History:  Procedure Laterality Date   A-FLUTTER ABLATION N/A 01/11/2019   Procedure: A-FLUTTER ABLATION;  Surgeon: Marinus Maw, MD;  Location: [facility] INVASIVE CV LAB;  Service: Cardiovascular;  Laterality: N/A;   CARDIAC  CATHETERIZATION     CARDIOVERSION N/A 11/20/2018   Procedure: CARDIOVERSION;  Surgeon: Lennette Bihari, MD;  Location: [facility] ENDOSCOPY;  Service: Cardiovascular;  Laterality: N/A;   cartliage removed from nose  21yrs ago   cataract surgery Bilateral    CERVICAL SPINE SURGERY     COLONOSCOPY     CORONARY ARTERY BYPASS GRAFT N/A 11/16/2017   Procedure: CORONARY ARTERY BYPASS GRAFTING (CABG) x4. LEFT ENDOSCOPIC SAPHENOUS VEIN HARVEST AND MAMMARY ARTERY TAKE DOWN. LIMA TO LAD, SVG TO PDA, SVG TO DISTAL CIRC & OMI.;  Surgeon: Delight Ovens, MD;  Location: [facility] OR;  Service: Open Heart Surgery;  Laterality: N/A;   EYE SURGERY     FOREIGN BODY REMOVAL ESOPHAGEAL     KNEE SURGERY Left    couple of times   LEFT HEART CATH AND CORONARY ANGIOGRAPHY N/A 11/15/2017   Procedure: LEFT HEART CATH AND CORONARY ANGIOGRAPHY;  Surgeon: Lennette Bihari, MD;  Location: [facility] INVASIVE CV LAB;  Service: Cardiovascular;  Laterality: N/A;   NASAL SINUS SURGERY     PENILE PROSTHESIS IMPLANT N/A 03/08/2022   Procedure: INSERTION OF INFLATABLE PENILE IMPLANT;  Surgeon: Despina Arias, MD;  Location: [facility] ORS;  Service: Urology;  Laterality: N/A;  90 MINUTES NEEDED FOR CASE   POLYPECTOMY     TEE WITHOUT CARDIOVERSION N/A 11/16/2017   Procedure: TRANSESOPHAGEAL ECHOCARDIOGRAM (TEE);  Surgeon: Tyrone Sage,  Gwenith Daily, MD;  Location: [facility] OR;  Service: Open Heart Surgery;  Laterality: N/A;   TONSILLECTOMY     TOTAL HIP ARTHROPLASTY Right 04/17/2020   Procedure: RIGHT TOTAL HIP ARTHROPLASTY ANTERIOR APPROACH;  Surgeon: Kathryne Hitch, MD;  Location: [facility] ORS;  Service: Orthopedics;  Laterality: Right;   TOTAL KNEE ARTHROPLASTY Left 06/17/2014   Procedure: TOTAL KNEE ARTHROPLASTY;  Surgeon: Valeria Batman, MD;  Location: [facility] OR;  Service: Orthopedics;  Laterality: Left;    Current Medications: Outpatient Medications Prior to Visit  Medication Sig Dispense Refill   acetaminophen (TYLENOL) 500 MG tablet Take 2 tablets (1,000  mg total) by mouth every 6 (six) hours. 30 tablet 1   acyclovir (ZOVIRAX) 400 MG tablet Take 400 mg by mouth 2 (two) times daily.     aspirin EC 81 MG tablet Take 1 tablet (81 mg total) by mouth daily. 90 tablet 3   cetirizine (ZYRTEC) 10 MG tablet Take 10 mg by mouth daily.     citalopram (CELEXA) 20 MG tablet Take 20 mg by mouth daily.     COSOPT 2-0.5 % ophthalmic solution Place 1 drop into the right eye 2 (two) times daily.     ELIQUIS 5 MG TABS tablet TAKE 1 TABLET(5 MG) BY MOUTH TWICE DAILY 180 tablet 1   ferrous sulfate 325 (65 FE) MG tablet Take 325 mg by mouth daily.     furosemide (LASIX) 40 MG tablet Take 1 tablet (40mg )  in the AM and 0.5 tablet (20mg ) in the evening. 135 tablet 3   levothyroxine (SYNTHROID) 150 MCG tablet TAKE 1 TABLET(150 MCG) BY MOUTH DAILY 90 tablet 0   lisinopril (ZESTRIL) 10 MG tablet Take 1 tablet (10 mg total) by mouth daily. 30 tablet 0   metFORMIN (GLUCOPHAGE-XR) 500 MG 24 hr tablet Take 500 mg by mouth in the morning and at bedtime.     metoprolol tartrate (LOPRESSOR) 25 MG tablet Take 1 tablet (25 mg total) by mouth 2 (two) times daily. 180 tablet 3   Misc Natural Products (JOINT HEALTH PO) Take 1 tablet by mouth daily.     Multiple Vitamins-Minerals (PRESERVISION AREDS 2 PO) Take 1 tablet by mouth 2 (two) times daily.     pantoprazole (PROTONIX) 20 MG tablet TAKE 2 TABLETS(40 MG) BY MOUTH DAILY 180 tablet 1   potassium chloride (KLOR-CON) 20 MEQ packet Take 20 mEq by mouth daily. 30 each 0   prednisoLONE acetate (PRED FORTE) 1 % ophthalmic suspension Place 1 drop into the right eye 4 (four) times daily.     rosuvastatin (CRESTOR) 40 MG tablet TAKE 1 TABLET(40 MG) BY MOUTH DAILY 90 tablet 3   zolpidem (AMBIEN) 10 MG tablet Take 10 mg by mouth at bedtime.     nitroGLYCERIN (NITROSTAT) 0.4 MG SL tablet DISSOLVE 1 TABLET UNDER THE TONGUE EVERY 5 MINUTES AS NEEDED FOR CHEST PAIN 50 tablet 1   celecoxib (CELEBREX) 200 MG capsule Take 1 capsule (200 mg total)  by mouth 2 (two) times daily. (Patient not taking: Reported on 06/22/2022) 28 capsule 1   ezetimibe (ZETIA) 10 MG tablet Take 1 tablet (10 mg total) by mouth daily. 90 tablet 3   icosapent Ethyl (VASCEPA) 1 g capsule TAKE 2 CAPSULES(2 GRAMS) BY MOUTH TWICE DAILY (Patient not taking: Reported on 03/21/2022) 360 capsule 1   oxyCODONE (OXY IR/ROXICODONE) 5 MG immediate release tablet Take 1 tablet (5 mg total) by mouth every 6 (six) hours as needed for severe pain. (Patient not taking:  Reported on 06/22/2022) 16 tablet 0   OZEMPIC, 1 MG/DOSE, 4 MG/3ML SOPN Inject 1 mg into the skin every Tuesday. (Patient not taking: Reported on 06/22/2022)     No facility-administered medications prior to visit.     Allergies:   Patient has no known allergies.   Social History   Socioeconomic History   Marital status: Single    Spouse name: Not on file   Number of children: 3   Years of education: Not on file   Highest education level: Some college, no degree  Occupational History   Occupation: Owner  Tobacco Use   Smoking status: Never   Smokeless tobacco: Never  Vaping Use   Vaping Use: Never used  Substance and Sexual Activity   Alcohol use: Yes    Alcohol/week: 10.0 standard drinks of alcohol    Types: 10 Standard drinks or equivalent per week    Comment: 2 drinks 5 nights per week   Drug use: No   Sexual activity: Never  Other Topics Concern   Not on file  Social History Narrative   Not on file   Social Determinants of Health   Financial Resource Strain: Low Risk  (10/29/2020)   Overall Financial Resource Strain (CARDIA)    Difficulty of Paying Living Expenses: Not hard at all  Food Insecurity: No Food Insecurity (10/29/2020)   Hunger Vital Sign    Worried About Running Out of Food in the Last Year: Never true    Ran Out of Food in the Last Year: Never true  Transportation Needs: No Transportation Needs (10/29/2020)   PRAPARE - Administrator, Civil Service (Medical): No     Lack of Transportation (Non-Medical): No  Physical Activity: Sufficiently Active (10/29/2020)   Exercise Vital Sign    Days of Exercise per Week: 3 days    Minutes of Exercise per Session: 60 min  Stress: No Stress Concern Present (02/15/2018)   [employer] of Occupational Health - Occupational Stress Questionnaire    Feeling of Stress : Only a little  Social Connections: Not on file    Additional social history is notable in that he is divorced x2.  He has 3 children and his oldest son lives in [place], works for [employer] and will be relocating with his promotion to [place].   His second son  had lived [place] and was married in [place] and in June 2020 he moved back to the [place] area.  His daughter works in [place] as a Emergency planning/management officer in a Education officer, environmental.  There is no tobacco use.  He drinks alcohol.  Family History:  The patient's family history includes Colon cancer in his paternal grandfather.  Mother died at age 39 with emphysema.  His father died at 73.  He has 2 sisters ages 73 and 7.  ROS General: Negative; No fevers, chills, or night sweats; positive for fatigue HEENT: Decreased hearing with hearing aid in right ear, no sinus congestion, difficulty swallowing; right eyelid droop Pulmonary: Negative; No cough, wheezing, shortness of breath, hemoptysis Cardiovascular: See HPI GI: History of diverticular disease and colonic polyps. GU: Remote history of genital herpes; erectile dysfunction; status post inflatable penile implant March 08, 2022 Musculoskeletal: Negative; no myalgias, joint pain, or weakness Hematologic/Oncology: Negative; no easy bruising, bleeding Endocrine: Positive for hypothyroidism Neuro: Negative; no changes in balance, headaches Skin: Negative; No rashes or skin lesions Psychiatric:  Sleep: Positive snoring snoring, previous fatigue; documented to have severe OSA with  recommended treatment with BiPAP therapy; no bruxism, restless  legs, hypnogognic hallucinations, no cataplexy He has not used BiPAP since 2021. Other comprehensive 14 point system review is negative.   PHYSICAL EXAM:   VS:  BP (!) 138/98   Pulse 69   Ht 5' 7.5" (1.715 m)   Wt 200 lb (90.7 kg)   SpO2 98%   BMI 30.86 kg/m     Repeat blood pressure by me was 124/70. , Wt Readings from Last 3 Encounters:  06/22/22 200 lb (90.7 kg)  03/21/22 210 lb 6.4 oz (95.4 kg)  02/25/22 206 lb (93.4 kg)    General: Alert, oriented, no distress.  Skin: normal turgor, no rashes, warm and dry HEENT: Normocephalic, atraumatic. Pupils equal round and reactive to light; sclera anicteric; extraocular muscles intact;  Nose without nasal septal hypertrophy Mouth/Parynx benign; Mallinpatti scale 3 Neck: No JVD, no carotid bruits; normal carotid upstroke Lungs: clear to ausculatation and percussion; no wheezing or rales Chest wall: without tenderness to palpitation Heart: PMI not displaced, RRR, s1 s2 normal, 1/6 systolic murmur, no diastolic murmur, no rubs, gallops, thrills, or heaves Abdomen: soft, nontender; no hepatosplenomehaly, BS+; abdominal aorta nontender and not dilated by palpation. Back: no CVA tenderness Pulses 2+ Musculoskeletal: full range of motion, normal strength, no joint deformities Extremities: no clubbing cyanosis or edema, Homan's sign negative  Neurologic: grossly nonfocal; Cranial nerves grossly wnl Psychologic: Normal mood and affect    Studies/Labs Reviewed:   Jun 22, 2022 ECG (independently read by me): Normal sinus rhythm at 69 bpm.  Normal intervals.  No significant ST-T change.  March 21, 2022 ECG (independently read by me): Sinus bradycardia at 49, RBBB  August 03, 2020 ECG (independently read by me):  Sinus bradycadia at 47; no ectopy    March 12, 2020 ECG (independently read by me): Sinus bradycardia 51 bpm. Possible left atrial enlargement. VH. No ectopy. Normal intervals.  June 2021 ECG (independently read by  me):Sinus bradycardia at 53; QTc 437 msec  March 18, 2021ECG (independently read by me):Sinus bradycardia at 48; QTc 441 msec; No ST changes; no ectopy  January 7, 2021ECG (independently read by me): Sinus Bradycardia at 51; no STT changes; QTc 442 msec; no ectopy  January 16, 2019 ECG (independently read by me): Sinus bradycardia at 57 bpm with possible left atrial enlargement.  PR interval 176 ms, QTc interval 476 ms.  No ST segment changes.  January 02, 2019 ECG (independently read by me): Atrial flutter with 2-1 AV conduction with a ventricular rate of 130 bpm.  December 21, 2018 ECG (independently read by me): Atrial flutter with variable block at 115, QT 402,QTC 556 msec  Cardioversion November 20, 2018 with restoration of sinus rhythm.  November 15, 2018 ECG (independently read by me): Atrial Flutter with variable block at 97 bpm  September 2020 ECG (independently read by me): Atrial flutter with variable block with an average heart rate at 115 bpm  October 29, 2018 ECG (independently read by me): Atrial flutter with variable block 130 bpm.  No specific ST-T changes  October 08, 2018 ECG (independently read by me): NSR at 60; isolated PVC; QTc 434 msec.  April 02, 2018 ECG (independently read by me): NSR 62; Nonspecific T change; QTc 452 msec  December 29, 2017 ECG (independently read by me): Sinus bradycardia at 49 bpm.  Nonspecific ST changes.  QTc interval 464 ms.  No ectopy.  ECG (independently read by me): Sinus rhythm at 64 bpm.  Right bundle  branch block with repolarization changes.  No ectopy.  October 24, 2017 ECG (independently read by me): Normal sinus rhythm at 62 bpm.  Right bundle branch block with repolarization changes.   ECG  April 18, 2014 revealed normal sinus rhythm.  Right bundle branch block was not present.  Recent Labs:    Latest Ref Rng & Units 04/19/2022   10:37 AM 02/25/2022    1:30 PM 05/03/2021    3:04 PM  BMP  Glucose 70 - 99 mg/dL 96  578   74   BUN 8 - 27 mg/dL 21  30  17    Creatinine 0.76 - 1.27 mg/dL 4.69  6.29  5.28   BUN/Creat Ratio 10 - 24 16   17    Sodium 134 - 144 mmol/L 136  137  141   Potassium 3.5 - 5.2 mmol/L 4.4  4.1  4.7   Chloride 96 - 106 mmol/L 95  100  99   CO2 20 - 29 mmol/L 27  24  21    Calcium 8.6 - 10.2 mg/dL 41.3  9.2  9.8         Latest Ref Rng & Units 04/19/2022   10:37 AM 12/02/2020   10:02 PM 10/27/2020    5:08 PM  Hepatic Function  Total Protein 6.0 - 8.5 g/dL 6.7  7.0  6.7   Albumin 3.8 - 4.8 g/dL 4.4  4.2  3.9   AST 0 - 40 IU/L 30  57  24   ALT 0 - 44 IU/L 29  51  25   Alk Phosphatase 44 - 121 IU/L 110  103  113   Total Bilirubin 0.0 - 1.2 mg/dL 0.6  0.7  0.9        Latest Ref Rng & Units 04/19/2022   10:40 AM 02/25/2022    1:30 PM 12/02/2020   10:02 PM  CBC  WBC 3.4 - 10.8 x10E3/uL 7.3  8.4  12.0   Hemoglobin 13.0 - 17.7 g/dL 24.4  01.0  27.2   Hematocrit 37.5 - 51.0 % 41.5  44.8  41.3   Platelets 150 - 450 x10E3/uL 233  228  238    Lab Results  Component Value Date   MCV 98 (H) 04/19/2022   MCV 97.0 02/25/2022   MCV 93.0 12/02/2020   Lab Results  Component Value Date   TSH 2.640 04/19/2022   Lab Results  Component Value Date   HGBA1C 5.5 02/25/2022     BNP    Component Value Date/Time   BNP 667.7 (H) 10/27/2020 1708    ProBNP No results found for: "PROBNP"   Lipid Panel     Component Value Date/Time   CHOL 100 04/19/2022 1040   TRIG 148 04/19/2022 1040   HDL 38 (L) 04/19/2022 1040   CHOLHDL 2.6 04/19/2022 1040   CHOLHDL 3.4 01/29/2007 0410   VLDL 17 01/29/2007 0410   LDLCALC 37 04/19/2022 1040     RADIOLOGY: No results found.    Additional studies/ records that were reviewed today include:  I reviewed his prior office visits and most recent DC cardioversion.  CONCLUSIONS:  1. Isthmus-dependent right atrial flutter upon presentation.  2. Successful radiofrequency ablation of atrial flutter along the cavotricuspid isthmus with complete  bidirectional isthmus block achieved.  3. No inducible arrhythmias following ablation.  4. No early apparent complications.    Lewayne Bunting, MD  12:37 PM 01/11/2019    SPLIT NIGHT SLEEP STUDY: 01/25/2019 SLEEP STUDY TECHNIQUE As per the AASM  Manual for the Scoring of Sleep and Associated Events v2.3 (April 2016) with a hypopnea requiring 4% desaturations.   The channels recorded and monitored were frontal, central and occipital EEG, electrooculogram (EOG), submentalis EMG (chin), nasal and oral airflow, thoracic and abdominal wall motion, anterior tibialis EMG, snore microphone, electrocardiogram, and pulse oximetry. Bi-level positive airway pressure (BiPAP) was initiated when the patient met split night criteria and was titrated according to treat sleep-disordered breathing.   RESPIRATORY PARAMETERS Diagnostic Total AHI (/hr):            63.5     RDI (/hr):         65.8     OA Index (/hr):            17.7     CA Index (/hr):      5.9 REM AHI (/hr):            N/A      NREM AHI (/hr):          63.5     Supine AHI (/hr):         63.5     Non-supine AHI (/hr):        N/A Min O2 Sat (%):          81.0     Mean O2 (%):  93.0     Time below 88% (min):           18.7        Titration Optimal IPAP Pressure (cm): 18        Optimal EPAP Pressure (cm):            14        AHI at Optimal Pressure (/hr):          3.6       Min O2 at Optimal Pressure (%):       93.0 Sleep % at Optimal (%):         97        Supine % at Optimal (%):       7             SLEEP ARCHITECTURE The study was initiated at 10:10:34 PM and terminated at 4:34:09 AM. The total recorded time was 383.6 minutes. EEG confirmed total sleep time was 333.3 minutes yielding a sleep efficiency of 86.9%%. Sleep onset after lights out was 0.8 minutes with a REM latency of 195.0 minutes. The patient spent 1.5%% of the night in stage N1 sleep, 80.9%% in stage N2 sleep, 0.0%% in stage N3 and 17.6% in REM. Wake after sleep onset (WASO) was 49.5  minutes. The Arousal Index was 32.0/hour.   LEG MOVEMENT DATA The total Periodic Limb Movements of Sleep (PLMS) were 0. The PLMS index was 0.0 .   CARDIAC DATA The 2 lead EKG demonstrated sinus rhythm. The mean heart rate was 100.0 beats per minute. Other EKG findings include: None.   IMPRESSIONS - Severe obstructive sleep apnea occurred during the diagnostic portion of the study (AHI 63.5 /h; RDI 65.8/h). CPAP was initiated at 5 cm and was titrated to 9 cm . Due to frequent central events, BiPAP was implemented at 11/7 and was titrated to 18/14 cm of water: AHI 3.6/h, O2 nadir 93%. 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 - Moderate oxygen desaturation was noted during the diagnostic portion of the study (Min O2 = 81.0%). - The patient snored with  loud snoring volume during the diagnostic portion of the study. - It appears that the patient was in atrial fibrillation throughtout the study. - Clinically significant periodic limb movements of sleep did not occur during the study.   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 A Small size Fisher[employer]Paykel Full Face Mask Simplus mask was used for the titration. If patient continues to experience central events, ASV titration may be necessary. - Effort should be made to optimize nasal and oropharyngeal patency. - Avoid alcohol, sedatives and other CNS depressants that may worsen sleep apnea and disrupt normal sleep architecture. - Sleep hygiene should be reviewed to assess factors that may improve sleep quality. - Weight management and regular exercise should be initiated or continued. - Recoomend f/u cardiology evalution for probable recurrent atrial fibrillation.  - Recommend a download in 30 days and sleep clinic evaluation after 30  days of therapy.   [Electronically signed] 01/27/2019 04:13 PM   Nicki Guadalajara MD, [facility], ABSM Diplomate, American Board of Sleep Medicine    ASSESSMENT:    1. Coronary artery disease involving native coronary artery of native heart without angina pectoris   2. S/P CABG x 4   3. Mixed hyperlipidemia   4. S/P ablation of atrial flutter   5. OSA (obstructive sleep apnea): Previously on BiPAP, currently on no therapy   6. Type 2 diabetes mellitus with complication, without long-term current use of insulin (HCC)   7. Acquired hypothyroidism     PLAN:  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  He retired at the end of December 2021.  He has a history of mild hyperlipidemia and remotely had been on simvastatin 40 mg daily and had been followed by Dr. Catha Gosselin.  In the fall 2019 he began to notice mild shortness of breath particularly with walking up steps.  An echo Doppler study revealed hyperdynamic LV function with an EF of 65 to 70% but with grade 2 diastolic dysfunction and abnormal tissue Doppler.  A CT coronary angiogram was suggestive of significant multivessel CAD and was FFR positive.  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 Tyrone Sage.  His postoperative course was complicated by paroxysmal atrial fibrillation for which he was started on amiodarone and ultimately was discharged on Eliquis.  When I saw him for initial post hospital evaluation he was still on amiodarone 200 mg twice a day, metoprolol tartrate 25 mg twice a day.  His ECG showed sinus bradycardia 5 weeks status post CABG revascularization; amiodarone was decreased to 200 mg daily and subsequently discontinued when subsequent laboratory showed LFT elevation and further increase in his TSH level.  With his hypothyroidism he  required increasing doses of levothyroxine, ultimately titrated to his present dose of 150 mcg daily.  He had developed episodes of atrial flutter and underwent initial cardioversion in October 2020.  Due to recurrent atrial flutter he was ultimately referred to Dr. Ladona Ridgel and underwent successful a flutter ablation on January 12, 2019.  Due to concerns for obstructive sleep apnea he ultimately was able to have his sleep study which confirmed my suspicion.  He was found to have severe sleep apnea  with an AHI of over 60 times per hour with significant oxygen desaturation to a nadir of 80%.  When I saw him for sleep and follow-up of his ablation in early January,  he has been on BiPAP therapy for 9 days and felt marked improvement in his previous fatigability and energy.   Of note he had run out of his metoprolol 2 days prior to his sleep study and he was back in atrial fibrillation during his sleep evaluation.  His ECG at his January visit on metoprolol 75 mg twice a day revealed  sinus rhythm with asymptomatic sinus bradycardia 51 bpm.  He underwent successful right hip replacement surgery without cardiovascular compromise. He never reinstituted BiPAP therapy and has been off treatment since 2021.  His primary care provider retired and he is now followed by Carin Hock, PA at [place].  He has been on chronic Eliquis for anticoagulation with his history of atrial fibrillation.   I had not seen him since June 2022 and saw him last on March 21, 2020.  On March 08, 2022 he underwent successful urologic surgery by Dr. Cheree Ditto mention with an inflatable penile implant.  He tolerated surgery without cardiovascular compromise.  At his February 2024 office visit, he was bradycardic and had low blood pressure secondary to inadvertently taking the wrong dose of metoprolol.  Time, I read recommended he resume his prior dose of 25 mg twice a day rather than what he was inadvertently taking 50 mg twice a day.  Over the past  several months, Sajan has felt well.  His blood pressure has been stable.  He denies chest pain or shortness of breath.  He tries to walk approximately 2 miles per day.  He denies any palpitations.  Today on repeat by me was stable at 124/70.  ECG shows sinus rhythm at 69 bpm without ectopy and with normal intervals.  Reviewed recent laboratory.  Lipid studies from April 19, 2022 showed total cholesterol 100, triglycerides 148, HDL 38 LDL 37 on his regimen of Zetia 10 mg, Vascepa 2 capsules twice a day in addition to rosuvastatin 40 mg daily.  Blood pressure today is stable on metoprolol tartrate 25 mg twice a day, lisinopril 10 mg daily and furosemide.  There is no significant edema.  I have renewed his sublingual nitroglycerin prescription.  He will contact our office to tell us what medicine he also had just run out of so that we can call in a renewal prescription.  He believes he is sleeping well.  He has not used BiPAP therapy since 2021.  I will see him in 6 months for reevaluation or sooner as needed.     Medication Adjustments/Labs and Tests Ordered: Current medicines are reviewed at length with the patient today.  Concerns regarding medicines are outlined above.  Medication changes, Labs and Tests ordered today are listed in the Patient Instructions below. Patient Instructions  Medication Instructions:  No changes *If you need a refill on your cardiac medications before your next appointment, please call your pharmacy*  Follow-Up: At [facility], you and your health needs are our priority.  As part of our continuing mission to provide you with exceptional heart care, we have created designated Provider Care Teams.  These Care Teams include your primary Cardiologist (physician) and Advanced Practice Providers (APPs -  Physician Assistants and Nurse Practitioners) who all work together to provide you with the care you need, when you need it.  We recommend signing up for the  patient  portal called "MyChart".  Sign up information is provided on this After Visit Summary.  MyChart is used to connect with patients for Virtual Visits (Telemedicine).  Patients are able to view lab/test results, encounter notes, upcoming appointments, etc.  Non-urgent messages can be sent to your provider as well.   To learn more about what you can do with MyChart, go to [email or web address].    Your next appointment:   6 month(s)= 12/23/22 at 1:20 pm  Provider:   Nicki Guadalajara, MD       Signed, Nicki Guadalajara, MD  06/22/2022 5:36 PM    [facility] [address], [place], [place]  [address] Phone: [phone]

## 2022-06-23 DIAGNOSIS — G4733 Obstructive sleep apnea (adult) (pediatric): Secondary | ICD-10-CM | POA: Diagnosis not present

## 2022-06-23 DIAGNOSIS — E785 Hyperlipidemia, unspecified: Secondary | ICD-10-CM | POA: Diagnosis not present

## 2022-06-23 DIAGNOSIS — E032 Hypothyroidism due to medicaments and other exogenous substances: Secondary | ICD-10-CM | POA: Diagnosis not present

## 2022-06-23 DIAGNOSIS — F419 Anxiety disorder, unspecified: Secondary | ICD-10-CM | POA: Diagnosis not present

## 2022-06-23 DIAGNOSIS — I509 Heart failure, unspecified: Secondary | ICD-10-CM | POA: Diagnosis not present

## 2022-06-23 DIAGNOSIS — E669 Obesity, unspecified: Secondary | ICD-10-CM | POA: Diagnosis not present

## 2022-06-23 DIAGNOSIS — Z7901 Long term (current) use of anticoagulants: Secondary | ICD-10-CM | POA: Diagnosis not present

## 2022-06-23 DIAGNOSIS — I25119 Atherosclerotic heart disease of native coronary artery with unspecified angina pectoris: Secondary | ICD-10-CM | POA: Diagnosis not present

## 2022-06-23 DIAGNOSIS — I110 Hypertensive heart disease with heart failure: Secondary | ICD-10-CM | POA: Diagnosis not present

## 2022-06-23 DIAGNOSIS — G4700 Insomnia, unspecified: Secondary | ICD-10-CM | POA: Diagnosis not present

## 2022-06-23 DIAGNOSIS — E1165 Type 2 diabetes mellitus with hyperglycemia: Secondary | ICD-10-CM | POA: Diagnosis not present

## 2022-06-23 DIAGNOSIS — I480 Paroxysmal atrial fibrillation: Secondary | ICD-10-CM | POA: Diagnosis not present

## 2022-06-24 DIAGNOSIS — E1165 Type 2 diabetes mellitus with hyperglycemia: Secondary | ICD-10-CM | POA: Diagnosis not present

## 2022-06-24 DIAGNOSIS — I10 Essential (primary) hypertension: Secondary | ICD-10-CM | POA: Diagnosis not present

## 2022-07-08 ENCOUNTER — Other Ambulatory Visit: Payer: Self-pay | Admitting: Cardiovascular Disease

## 2022-07-13 ENCOUNTER — Other Ambulatory Visit: Payer: Self-pay | Admitting: Cardiovascular Disease

## 2022-07-18 DIAGNOSIS — H6123 Impacted cerumen, bilateral: Secondary | ICD-10-CM | POA: Diagnosis not present

## 2022-08-03 DIAGNOSIS — H6122 Impacted cerumen, left ear: Secondary | ICD-10-CM | POA: Diagnosis not present

## 2022-09-23 DIAGNOSIS — H40051 Ocular hypertension, right eye: Secondary | ICD-10-CM | POA: Diagnosis not present

## 2022-09-23 DIAGNOSIS — T8579XS Infection and inflammatory reaction due to other internal prosthetic devices, implants and grafts, sequela: Secondary | ICD-10-CM | POA: Diagnosis not present

## 2022-09-23 DIAGNOSIS — H33311 Horseshoe tear of retina without detachment, right eye: Secondary | ICD-10-CM | POA: Diagnosis not present

## 2022-09-23 DIAGNOSIS — H524 Presbyopia: Secondary | ICD-10-CM | POA: Diagnosis not present

## 2022-09-23 DIAGNOSIS — H353132 Nonexudative age-related macular degeneration, bilateral, intermediate dry stage: Secondary | ICD-10-CM | POA: Diagnosis not present

## 2022-10-10 ENCOUNTER — Other Ambulatory Visit: Payer: Self-pay | Admitting: Internal Medicine

## 2022-10-10 ENCOUNTER — Other Ambulatory Visit: Payer: Self-pay | Admitting: Cardiovascular Disease

## 2022-10-12 DIAGNOSIS — G4700 Insomnia, unspecified: Secondary | ICD-10-CM | POA: Diagnosis not present

## 2022-10-12 DIAGNOSIS — E669 Obesity, unspecified: Secondary | ICD-10-CM | POA: Diagnosis not present

## 2022-10-12 DIAGNOSIS — H547 Unspecified visual loss: Secondary | ICD-10-CM | POA: Diagnosis not present

## 2022-10-12 DIAGNOSIS — E785 Hyperlipidemia, unspecified: Secondary | ICD-10-CM | POA: Diagnosis not present

## 2022-10-12 DIAGNOSIS — H3530 Unspecified macular degeneration: Secondary | ICD-10-CM | POA: Diagnosis not present

## 2022-10-12 DIAGNOSIS — I4891 Unspecified atrial fibrillation: Secondary | ICD-10-CM | POA: Diagnosis not present

## 2022-10-12 DIAGNOSIS — I25118 Atherosclerotic heart disease of native coronary artery with other forms of angina pectoris: Secondary | ICD-10-CM | POA: Diagnosis not present

## 2022-10-12 DIAGNOSIS — H409 Unspecified glaucoma: Secondary | ICD-10-CM | POA: Diagnosis not present

## 2022-10-12 DIAGNOSIS — B009 Herpesviral infection, unspecified: Secondary | ICD-10-CM | POA: Diagnosis not present

## 2022-10-12 DIAGNOSIS — E039 Hypothyroidism, unspecified: Secondary | ICD-10-CM | POA: Diagnosis not present

## 2022-10-12 DIAGNOSIS — H9193 Unspecified hearing loss, bilateral: Secondary | ICD-10-CM | POA: Diagnosis not present

## 2022-10-12 DIAGNOSIS — D6869 Other thrombophilia: Secondary | ICD-10-CM | POA: Diagnosis not present

## 2022-10-13 DIAGNOSIS — M65332 Trigger finger, left middle finger: Secondary | ICD-10-CM | POA: Diagnosis not present

## 2022-10-14 DIAGNOSIS — R2689 Other abnormalities of gait and mobility: Secondary | ICD-10-CM | POA: Diagnosis not present

## 2022-10-14 DIAGNOSIS — G4733 Obstructive sleep apnea (adult) (pediatric): Secondary | ICD-10-CM | POA: Diagnosis not present

## 2022-10-14 DIAGNOSIS — I509 Heart failure, unspecified: Secondary | ICD-10-CM | POA: Diagnosis not present

## 2022-10-14 DIAGNOSIS — E119 Type 2 diabetes mellitus without complications: Secondary | ICD-10-CM | POA: Diagnosis not present

## 2022-10-14 DIAGNOSIS — G4700 Insomnia, unspecified: Secondary | ICD-10-CM | POA: Diagnosis not present

## 2022-11-21 DIAGNOSIS — H6123 Impacted cerumen, bilateral: Secondary | ICD-10-CM | POA: Diagnosis not present

## 2022-12-02 ENCOUNTER — Other Ambulatory Visit (HOSPITAL_COMMUNITY): Payer: Self-pay

## 2022-12-07 DIAGNOSIS — Z0000 Encounter for general adult medical examination without abnormal findings: Secondary | ICD-10-CM | POA: Diagnosis not present

## 2022-12-07 DIAGNOSIS — Z23 Encounter for immunization: Secondary | ICD-10-CM | POA: Diagnosis not present

## 2022-12-07 DIAGNOSIS — E1122 Type 2 diabetes mellitus with diabetic chronic kidney disease: Secondary | ICD-10-CM | POA: Diagnosis not present

## 2022-12-07 DIAGNOSIS — E785 Hyperlipidemia, unspecified: Secondary | ICD-10-CM | POA: Diagnosis not present

## 2022-12-07 DIAGNOSIS — Z1331 Encounter for screening for depression: Secondary | ICD-10-CM | POA: Diagnosis not present

## 2022-12-07 DIAGNOSIS — Z9181 History of falling: Secondary | ICD-10-CM | POA: Diagnosis not present

## 2022-12-07 DIAGNOSIS — Z6836 Body mass index (BMI) 36.0-36.9, adult: Secondary | ICD-10-CM | POA: Diagnosis not present

## 2022-12-07 DIAGNOSIS — E1129 Type 2 diabetes mellitus with other diabetic kidney complication: Secondary | ICD-10-CM | POA: Diagnosis not present

## 2022-12-08 DIAGNOSIS — E1129 Type 2 diabetes mellitus with other diabetic kidney complication: Secondary | ICD-10-CM | POA: Diagnosis not present

## 2022-12-08 DIAGNOSIS — E785 Hyperlipidemia, unspecified: Secondary | ICD-10-CM | POA: Diagnosis not present

## 2022-12-22 ENCOUNTER — Other Ambulatory Visit: Payer: Self-pay | Admitting: Cardiovascular Disease

## 2022-12-22 DIAGNOSIS — I4891 Unspecified atrial fibrillation: Secondary | ICD-10-CM

## 2022-12-22 NOTE — Telephone Encounter (Signed)
Prescription refill request for Eliquis received. Indication: A Flutter Last office visit: 06/22/22  Bishop Limbo MD Scr: 1.32 on 04/19/22  Epic Age: 77 Weight: 90.7kg  Based on above findings Eliquis 5mg  twice daily is the appropriate dose.  Refill approved.

## 2022-12-23 ENCOUNTER — Ambulatory Visit: Payer: PPO | Attending: Cardiovascular Disease | Admitting: Cardiovascular Disease

## 2022-12-23 ENCOUNTER — Encounter: Payer: Self-pay | Admitting: Cardiovascular Disease

## 2022-12-23 DIAGNOSIS — E039 Hypothyroidism, unspecified: Secondary | ICD-10-CM | POA: Diagnosis not present

## 2022-12-23 DIAGNOSIS — I480 Paroxysmal atrial fibrillation: Secondary | ICD-10-CM | POA: Diagnosis not present

## 2022-12-23 DIAGNOSIS — Z951 Presence of aortocoronary bypass graft: Secondary | ICD-10-CM | POA: Diagnosis not present

## 2022-12-23 DIAGNOSIS — Z8679 Personal history of other diseases of the circulatory system: Secondary | ICD-10-CM

## 2022-12-23 DIAGNOSIS — E785 Hyperlipidemia, unspecified: Secondary | ICD-10-CM

## 2022-12-23 DIAGNOSIS — Z9889 Other specified postprocedural states: Secondary | ICD-10-CM | POA: Diagnosis not present

## 2022-12-23 DIAGNOSIS — E782 Mixed hyperlipidemia: Secondary | ICD-10-CM

## 2022-12-23 DIAGNOSIS — I2510 Atherosclerotic heart disease of native coronary artery without angina pectoris: Secondary | ICD-10-CM | POA: Diagnosis not present

## 2022-12-23 DIAGNOSIS — I4891 Unspecified atrial fibrillation: Secondary | ICD-10-CM

## 2022-12-23 NOTE — Progress Notes (Signed)
Cardiology Office Note    Date:  01/02/2023   ID:  Carl [name], DOB 09-14-1945, MRN [id number]  PCP:  Carin Hock, PA  Cardiologist:  Nicki Guadalajara, MD   6 month follow-up evaluation   History of Present Illness:  Carl [name] is a 77 y.o. male who presents to the office today for 6 month follow-up evaluation.    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  In 2009 he developed transient numbness of his right face which lasted for several minutes and at that time apparently underwent evaluation and had a normal MRI, MRA, and carotid duplex studies.  He was found to have only mild to moderate plaque in the proximal right internal carotid artery without stenosis.  I had seen him in March 2016 for preoperative cardiology clearance prior to undergoing left knee replacement surgery by Dr. Cleophas Dunker.  Preoperative ECG suggested possible junctional rhythm which was new from an ECG of 2012 which previously had only shown sinus bradycardia.  He was asymptomatic.  When I saw him, his ECG showed sinus rhythm at 61 bpm with normal intervals and on that ECG P waves were normal and upright inferiorly.  Carl [name] has remained fairly active. In 2019  he had not been exercising as much as he had in the past still working out with a trainer and denied any associated chest pain or palpitations.  He in September 201 19 he began to notice more shortness of breath with walking up steps.  He was  at a friend's house and was told that he appeared to be more short of breath than previously.  I saw him for evaluation of his exertional shortness of breath on October 24, 2017.  At that time, I recommended that he undergo a 2D echo Doppler study and scheduled him for coronary CT angiography.  His echo Doppler study demonstrated hyperdynamic LV function with an EF of 65 to 70%.  Doppler parameters suggest grade 2 diastolic dysfunction and elevated  ventricular end-diastolic filling pressure.  He had mild MR, mild TR, and mild dilation of his left atrium.  Pulmonary pressures were normal.  CT coronary angiography was performed on November 09, 2017.  This was abnormal and demonstrated an elevated calcium score a 25 which is 78th percentile for age and sex.  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  There was greater than 75% calcific plaque in his proximal and mid diagonal 1 vessel.  The circumflex had 50% proximal plaque.  There was 50 to 75% mixed plaque in the OM 2 vessel less than 50% in the OM1 vessel.  His RCA had 50% calcified plaque proximally, 50 to 75% calcified plaque in the mid vessel.  There was mild aortic root dilatation at 4.1 cm.  His CT images were referred for [facility] analysis which were positive in the mid RCA 0.78, the mid LAD 0.78 and in the AV groove circumflex at 0.71.   I saw him in follow-up of the above studies and recommended cardiac catheterization.  Catheterization was done in November 15, 2017 which showed severe multivessel CAD with 85% ostial LAD stenosis, diffuse 75% mid LAD stenosis, total occlusion of the mid AV groove circumflex after the takeoff of the second obtuse marginal vessel with 50% diffuse stenosis in the  first large marginal branch, 80 and 90% stenoses in the second obtuse marginal vessel and extensive collateralization to the distal circumflex and third marginal via the LAD.  He had 1650% mid RCA stenoses and a dominant RCA.  Of note, he had probable high right radial artery takeoff with spasm/coarse stenosis above the elbow and there was retrograde filling of the brachial artery and the catheterization was transition to the femoral approach.  He underwent successful CABG revascularization surgery the following day by Dr. Lowella Fairy on November 16, 2017 with a LIMA graft to his LAD, SVG to first  marginal and distal circumflex, and SVG to the PDA of his RCA with left thigh and calf endoscopic vein harvest.  He developed atrial fibrillation with RVR on postop day 2 and was started on IV amiodarone.  He ultimately converted back to sinus rhythm but developed recurrent AF on postop day 4 on metoprolol and amiodarone and ultimately converted back to sinus rhythm.  He was started on Eliquis on day 5 for anticoagulation.  He was discharged on November 22, 2017.  Approximately 10 days later he started to develop left lower extremity swelling and redness with pain in the back of his calf.  He was evaluated by Lowella Dandy, PA-C at [facility] on December 04, 2017.  There was no sign of infection.  Venous duplex imaging did not reveal any DVT.  He saw Dr. Tyrone Sage for initial follow-up evaluation on December 14, 2017 at which time he was maintaining sinus rhythm.  He will be participating in cardiac rehab and his orientation is scheduled for February 15, 2018.  He denied any recurrent chest pain.  His breathing has improved with walking.  He went to the beach and was walking at least a mile per day.  He is sleeping better and snoring is less.    Since his November 2019 and prior to his February 2020 evaluation he was doing well and was  participating in cardiac rehabilitation and also had joined a gym. He was exercising regularly.  He denied chest pain, PND, or orthopnea.  Subsequent laboratory showed further TSH elevation.  His levothyroxine was increased to 75 mcg.  Amiodarone was discontinued due to LFT elevation and his dose of rosuvastatin was reduced to one half of the current dose.  I  saw him in February 2020 at that time he had not had follow-up laboratory.    He had had follow-up laboratory which continued to show hypothyroidism and his dose of levothyroxine has gradually been increased.  Recent laboratory done by Dr. Clarene Duke several months ago continues to show a TSH was elevated and his levothyroxine dose was  increased from 100 to 125 mcg.  During the COVID-19 pandemic, he admits to weight gain.  He has not been exercising as much as he had in the past.  Although he felt well, when recently seen by Dr. Cleophas Dunker he was felt to potentially be more short of breath.  He was advised to see me back for follow-up evaluation.  I scheduled him to undergo repeat laboratory which was done on October 05, 2018.  Hemoglobin 15.4, hematocrit 44.2.  Lipid studies were significantly improved from 6 months previously with total cholesterol now 143, triglycerides 142, HDL 48, LDL 67.  Brain natruretic peptide was 131.  TSH had significantly improved but was still elevated at 15.9.  He had normal renal function with a BUN of 15 creatinine 0.96.  LFTs were significantly improved but minimally increased with an AST of  46 and ALT at 49.  He denied any chest pain or awareness of palpitations.  I saw him for reevaluation on October 08, 2018.  During that evaluation, I reviewed his laboratory and felt he was euvolemic.  His ECG showed normal sinus rhythm at 60 bpm with an isolated PVC.  His QTc interval was 434 ms.  With his continued TSH elevation, levothyroxine was further titrated to 150 mcg.  Subsequently, Markel has continued to feel well.  He denied any awareness of palpitations.  Last week, in October 29, 2018 when checking his Apple watch he saw report that his heart rate was increased in the upper 130s.  He was unaware of this irregularity.  Upon further questioning, he had to go out of town over the last several days and believes he may have missed taking his morning metoprolol dose on Friday and Saturday and he could not remember if he had taken it yesterday morning.  He was supposed to be taken metoprolol tartrate 25 mg twice a day, HCTZ on a as needed basis which he has not needed.  He continues to be on Eliquis 5 mg twice a day and is tolerated the levothyroxine 150 mcg dose.  He is now on the vascepa which was just renewed last  week 2 capsules twice a day, rosuvastatin 20 mg daily for mixed hyperlipidemia.  After receiving his phone call, I advised that he come to the office to check an EKG.  With his ECG showing atrial flutter at a rate of 130 he was added onto my schedule for further evaluation.  During that evaluation, he was asymptomatic and felt well.  His ECG showed atrial flutter with variable block at 130 bpm with nonspecific ST-T changes.  He was on Eliquis for anticoagulation.  I recommended titration of metoprolol tartrate to 50 mg twice a day.  Laboratory drawn that day revealed a TSH 5.95, magnesium 2.3, potassium 4.7, BUN 14 creatinine 0.9.   I saw him on November 05, 2018 for follow-up evaluation.  He was feeling well and continue to be unaware of his heart rate irregularity.  However according to his Apple Watch, his heart rate was running in the 115-120 range   He denied chest pain.  He denied tremors.  He was sleeping well and denied any awareness of sleep apnea.  During that evaluation we discussed gradually him for DC cardioversion but this would require COVID-19 testing with quarantine for 3 days prior to having the procedure done.  His daughter was coming into town that weekend.  As result we further titrated his metoprolol to 75 mg twice a day which he did for 4 days and then increase to 200 mg twice a day.  He continues to be on Eliquis for anticoagulation.  He admits to increased fatigue but denies chest pain.   I saw Carl [name] in follow-up on November 15, 2018.  At that time he had titrated the metoprolol up to 100 mg twice a day.  He underwent successful DC cardioversion on November 20, 2018 with restoration of sinus rhythm.  Of note, he developed hypotension prior to awakening from propofol and received normal saline and Neo-Synephrine.  Blood pressure normalized loud snoring was noted, and it was advised to consider sleep study evaluation.  Over the month after his cardioversion Carl [name] initially did well but he  inadvertently reduced his metoprolol from I suggested dose reduction to 75 mg twice a day and apparently has only been taking 25 mg twice a  day.  In addition, he has had difficulty with sinus congestion and was taking medication with pseudoephedrine.  He called the office on 12/20/18  stating that his Apple Watch had noted that his heart rate had been increased for almost a week and when I spoke with him his resting pulse was 135.  At that time I recommended he increase metoprolol to 50 mg twice a day and see me on the following day on December 21, 2018.  During that evaluation, he remained in atrial flutter with variable block at a rate of 115 bpm.  Recommended titration of metoprolol tartrate to 100 mg twice a day.  I discussed EP evaluation with consideration for atrial flutter ablation.  I again will had a lengthy discussion regarding the need for a sleep study due to high likelihood of obstructive sleep apnea.  He initially preferred trying the metoprolol increased dose to see if this would benefit him prior to an immediate EP evaluation.   I saw him in follow-up on the increase metoprolol 100 mg twice a day dosing at that time he was in 2-1 flutter with ventricular rate at 130.  I strongly recommended EP evaluation with atrial flutter ablation and discussed the case with Dr. Ladona Ridgel who agreed to see the patient in the office on December 3 and plan to do atrial flutter ablation on December 4.  Since his sleep study was tentatively scheduled for December 2,  I recommended that this be deferred until after his ablation.   He underwent successful ablation of his atrial flutter on January 11, 2019 which was isthmus-dependent right atrial flutter upon presentation to the EP lab.  He was able to be successfully treated and was discharged later that evening in sinus rhythm.  I saw him on January 16, 2019 after he called the office the day previously complaining of some shortness of breath.  He again called the  office this morning stating that he was more short of breath, very fatigued and having no energy.  As result I added him onto my schedule  for further evaluation.  He denied any chest pain and was unaware of any palpitations; per his apple watch monitoring his resting pulse yesterday was 56 and today 57.  During that evaluation, I recommended that he undergo a chest x-ray, follow-up 2D echo Doppler study, and scheduled him for Lexiscan Myoview study to make certain his dyspnea was not ischemia mediated.  His echo Doppler study showed EF at 60 to 65% with mild LVH.  There was abnormal septal motion consistent with his postoperative state.  There was grade 2 diastolic dysfunction.  He had severe biatrial enlargement.  A chest x-ray did not reveal any active disease.  A Lexiscan Myoview study was low risk without ischemia with nuclear stress EF at 60%.  He underwent his sleep study on January 25, 2019.  Apparently several days prior to his sleep study he apparently again ran out of his metoprolol.  Of note during the sleep study his ECG revealed that he was in atrial fibrillation.  He was found to have severe obstructive sleep apnea in a split-night protocol with an AHI of 63.5/h.  CPAP was initiated but due to continued events was transitioned to BiPAP therapy and he was ultimately titrated to 18/14.  AHI at this level was 3.6 with oxygen nadir 93%.    I saw him for follow-up on February 14, 2019.  At that time he had resumed metoprolol.  His set up date  for BiPAP was February 04, 2019.  Over the past week and a half on therapy he has noticed significant improvement in his previous fatigability.  A download was obtained since his set up date and he was 100% compliant since December 28.  He had a mask leak.  At 18/14 BiPAP pressure AHI was improved but still increased at 6.3.  There were no central events.    Carl [name] was reevaluated by Dr. Ladona Ridgel on February 19, 2019.  He was doing well and maintaining sinus rhythm.   Apparently was recommended that he reduce his metoprolol to 50 twice a day but apparently this has not been done.  I saw him on March 18 for his initial initial sleep clinic evaluation.  Of note, a download from January 8 through March 05, 2019 shows that he is meeting compliance standards with 84% of usage days and averaging 6 hours and 20 minutes.  AHI was 7.9 and his BiPAP device was set at a minimum EPAP of 12 with maximum IPAP of 22.  Apparently over the past month, Carl [name] states that he has lost 10 pounds by essentially giving up bread and reducing carbohydrates.  He believes with his weight loss he was not needing CPAP.  He had issues with mask leak and apparently went to get a new mask which was a ResMed air fit F 30i from [facility].  Apparently, he never adjusted the straps to fit his head and as result this resulted in significant leakage and consequently for the last 30 days he has only used CPAP for 5 days and only for 2 hours and 6 minutes on average.  AHI was 9.6 /h.  During that evaluation I had a lengthy discussion with him again stressing the importance of increased incidence of recurrent atrial fibrillation with his severe sleep apnea if left untreated.  He was not wearing his mask appropriately and we spent significant time education and instruction on how to place the mask on to fit well without significant leak.  At that time I reduced his metoprolol down to 75 mg in the morning and 50 mg at night.  I saw him in June 2021 during the prior several months he had felt well.  Over the past several months he has felt well.  Again there were times where he was not typically compliant with BiPAP use but over the past several weeks prior to his evaluation he had been using it more regularly and has noticed more energy.  Several weeks ago when he was not using it he did experience significant fatigability.  He is unaware of any breakthrough atrial fibrillation or flutter.  He denies  recurrent anginal symptoms.  I obtained a new download  from May 25 through July 31, 2019.  This revealed reduced compliance with 19 out of 30 days but over the past several weeks he has been much more consistent with CPAP use and only missed 2 days.  At his minimum EPAP pressure of 10 and maximum pressure of 25 AHI was increased at 11.  His 95th percentile pressure was 17.1/13.1 with a maximum average pressure 18.1/14.1.  When I saw Carl [name] on March 12, 2020 he was no longer dating his prior girlfriend.  When they were together she was playing a major role in his BiPAP compliance .  Apparently, Carl [name] stopped using BiPAP therapy and since September 2021 has only used on several occasions with approximately 8 days in October 1 day November and 1 day  in December.  He was unaware of any breakthrough palpitations or atrial flutter.  He denies chest pain or shortness of breath.  He denies any bleeding on Eliquis.  He admits to weight gain.  He is resuming exercise.  He would still like to undergo surgery to his right eyelid which continuously droops and inhibits his breathing capabilities.   I saw him on August 03, 2020.  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  Surgery was done by Dr. Magnus Ivan.  Rama tolerated surgery well without cardiovascular issues.  Several weeks ago, he began to notice significantly increased fatigability.  He was no longer using BiPAP.  He went to [facility] to his beach house for a week and ultimately has felt significantly improved.  He also is now dating another woman and he believes this has significantly invigorated him.  He is unaware of any recurrent anginal symptoms.  He is now been on over-the-counter omega-3 fatty acid instead of his previous Vascepa.  He continues to be on Eliquis for anticoagulation without bleeding and continues to take  low-dose aspirin with his CAD.  He is on levothyroxine 150 mcg for hypothyroidism, metoprolol 50 mg twice a day and is unaware of any recurrent atrial flutter or fibrillation.  He is on rosuvastatin 20 mg daily.    Keair was hospitalized in September 2022 with acute on chronic diastolic heart failure.  He was diuresed and discharged on daily Lasix.  He was subsequently seen by Joni Reining, NP in September 2022 and Micah Flesher, [facility] in January 2023.  He was  given clearance to undergo urologic surgery with insertion of inflatable penile implant which was done on March 08, 2022 by Dr.Graham Machen.  Eliquis was held for 2 days.  He tolerated surgery well.  I saw him on March 21, 2022. Carl [name] had noticed some low blood pressure and heart rate.  Apparently, he had inadvertently gone back to taking metoprolol tartrate 50 mg twice a day instead of one half a pill or 25 mg twice daily.  He has continued to be on aspirin 81 mg in addition to Eliquis with his CAD.  He has been taking furosemide 40 mg twice a day for swelling, lisinopril 10 mg daily, supplemental potassium.  He continues to be on levothyroxine 150 mcg for hypothyroidism.  He continues to take rosuvastatin 40 mg in addition to Vascepa 2 capsules twice a day for hyperlipidemia.  He is diabetic on metformin and Ozempic weekly.  He is no longer using BiPAP.  He continues to be on Eliquis 5 mg twice a day.  He had recently called our office for a renewal of his furosemide and since I had not seen him since June 2022 he was scheduled for follow-up evaluation.  During that evaluation, his heart rate was slow at 49 and blood pressure was 100/58 supine and 100/60 standing.  I recommended he decrease metoprolol to tartrate back to 25 mg twice a day and also decrease his furosemide from 40 mg twice a day to 40 mg in the morning and 20 mg at night with the possibility of discontinuing the evening dose depending upon blood pressure and leg swelling.    When  I last saw him on Jun 22, 2022 Carl [name] remained relatively stable.  Apparently he had lost a bottle of sublingual nitroglycerin and will need renewal.  He  also tells me he just ran out of one of his medications but he could not remember which 1 and he will need to go home and check and call our office so that this can be renewed.  He denies any chest pain or shortness of breath.  His resting pulse has been stable on the reduced dose of metoprolol.  Had undergone laboratory on April 19, 2022 which showed total cholesterol 100, triglycerides 148, HDL 38, and LDL cholesterol 37.  He be on rosuvastatin 40 mg and Zetia 10 mg daily in addition to Vascepa 2 capsules twice a day.  He denies significant leg swelling on furosemide 40 mg in morning and 20 mg at night and continues to be on lisinopril 10 mg daily metoprolol  Since I last saw him, he has felt well.  He had met a remote high school classmate who apparently is an excellent cook and over the past 6 months he admits to weight gain.  He has been exercising and goes to the gym 5 days/week.  He believes he is sleeping adequately.  He is unaware of recurrent atrial fibrillation or flutter and continues to be on Eliquis for anticoagulation.  He is on lisinopril 10 mg, metoprolol tartrate 25 mg twice a day for blood pressure.  He is on levothyroxine 150 for hypothyroidism.  He is diabetic on metformin and had a prescription for Ozempic but has not been taking this.  He is on rosuvastatin 40 mg for hyperlipidemia in addition to Zetia and Vascepa.  He presents for evaluation.    Past Medical History:  Diagnosis Date   Anxiety    takes Xanax daily as needed   Arthritis    CAD (coronary artery disease)    s/p CABG   Cataract    removed both eyes   Chronic diastolic CHF (congestive heart failure) (HCC)    Depression    takes Celexa daily   Diverticulitis    Diverticulosis    Enlarged prostate    sightly   Esophagitis    Gastric ulcer    GERD  (gastroesophageal reflux disease)    takes Protonix daily   Hx of adenomatous colonic polyps    benign   Hyperlipidemia    takes Zocor daily   Hypertension    Hypothyroidism    Insomnia    takes Ambien nightly   Joint pain    Joint swelling    OSA (obstructive sleep apnea)    Paroxysmal atrial fibrillation (HCC)    On Eliquis   Pre-diabetes     Past Surgical History:  Procedure Laterality Date   A-FLUTTER ABLATION N/A 01/11/2019   Procedure: A-FLUTTER ABLATION;  Surgeon: Marinus Maw, MD;  Location: [facility] INVASIVE CV LAB;  Service: Cardiovascular;  Laterality: N/A;   CARDIAC CATHETERIZATION     CARDIOVERSION N/A 11/20/2018   Procedure: CARDIOVERSION;  Surgeon: Lennette Bihari, MD;  Location: [facility] ENDOSCOPY;  Service: Cardiovascular;  Laterality: N/A;   cartliage removed from nose  66yrs ago   cataract surgery Bilateral    CERVICAL SPINE SURGERY     COLONOSCOPY     CORONARY ARTERY BYPASS GRAFT N/A 11/16/2017   Procedure: CORONARY ARTERY BYPASS GRAFTING (CABG) x4. LEFT ENDOSCOPIC SAPHENOUS VEIN HARVEST AND MAMMARY ARTERY TAKE DOWN. LIMA TO LAD, SVG TO PDA, SVG TO DISTAL CIRC & OMI.;  Surgeon: Delight Ovens, MD;  Location: [facility] OR;  Service: Open Heart Surgery;  Laterality: N/A;   EYE SURGERY     FOREIGN BODY  REMOVAL ESOPHAGEAL     KNEE SURGERY Left    couple of times   LEFT HEART CATH AND CORONARY ANGIOGRAPHY N/A 11/15/2017   Procedure: LEFT HEART CATH AND CORONARY ANGIOGRAPHY;  Surgeon: Lennette Bihari, MD;  Location: [facility] INVASIVE CV LAB;  Service: Cardiovascular;  Laterality: N/A;   NASAL SINUS SURGERY     PENILE PROSTHESIS IMPLANT N/A 03/08/2022   Procedure: INSERTION OF INFLATABLE PENILE IMPLANT;  Surgeon: Despina Arias, MD;  Location: [facility] ORS;  Service: Urology;  Laterality: N/A;  90 MINUTES NEEDED FOR CASE   POLYPECTOMY     TEE WITHOUT CARDIOVERSION N/A 11/16/2017   Procedure: TRANSESOPHAGEAL ECHOCARDIOGRAM (TEE);  Surgeon: Delight Ovens, MD;  Location: [facility] OR;   Service: Open Heart Surgery;  Laterality: N/A;   TONSILLECTOMY     TOTAL HIP ARTHROPLASTY Right 04/17/2020   Procedure: RIGHT TOTAL HIP ARTHROPLASTY ANTERIOR APPROACH;  Surgeon: Kathryne Hitch, MD;  Location: [facility] ORS;  Service: Orthopedics;  Laterality: Right;   TOTAL KNEE ARTHROPLASTY Left 06/17/2014   Procedure: TOTAL KNEE ARTHROPLASTY;  Surgeon: Valeria Batman, MD;  Location: [facility] OR;  Service: Orthopedics;  Laterality: Left;    Current Medications: Outpatient Medications Prior to Visit  Medication Sig Dispense Refill   acetaminophen (TYLENOL) 500 MG tablet Take 2 tablets (1,000 mg total) by mouth every 6 (six) hours. 30 tablet 1   acyclovir (ZOVIRAX) 400 MG tablet Take 400 mg by mouth 2 (two) times daily.     aspirin EC 81 MG tablet Take 1 tablet (81 mg total) by mouth daily. 90 tablet 3   cetirizine (ZYRTEC) 10 MG tablet Take 10 mg by mouth daily.     citalopram (CELEXA) 20 MG tablet Take 20 mg by mouth daily.     COSOPT 2-0.5 % ophthalmic solution Place 1 drop into the right eye 2 (two) times daily.     ELIQUIS 5 MG TABS tablet TAKE 1 TABLET(5 MG) BY MOUTH TWICE DAILY 180 tablet 1   ferrous sulfate 325 (65 FE) MG tablet Take 325 mg by mouth daily.     furosemide (LASIX) 40 MG tablet Take 1 tablet (40mg )  in the AM and 0.5 tablet (20mg ) in the evening. 135 tablet 3   icosapent Ethyl (VASCEPA) 1 g capsule TAKE 2 CAPSULES(2 GRAMS) BY MOUTH TWICE DAILY 360 capsule 1   levothyroxine (SYNTHROID) 150 MCG tablet TAKE 1 TABLET(150 MCG) BY MOUTH DAILY 90 tablet 0   metFORMIN (GLUCOPHAGE-XR) 500 MG 24 hr tablet Take 500 mg by mouth in the morning and at bedtime.     metoprolol tartrate (LOPRESSOR) 25 MG tablet Take 1 tablet (25 mg total) by mouth 2 (two) times daily. 180 tablet 3   Misc Natural Products (JOINT HEALTH PO) Take 1 tablet by mouth daily.     Multiple Vitamins-Minerals (PRESERVISION AREDS 2 PO) Take 1 tablet by mouth 2 (two) times daily.     nitroGLYCERIN (NITROSTAT) 0.4 MG SL  tablet DISSOLVE 1 TABLET UNDER THE TONGUE EVERY 5 MINUTES AS NEEDED FOR CHEST PAIN 50 tablet 1   oxyCODONE (OXY IR/ROXICODONE) 5 MG immediate release tablet Take 1 tablet (5 mg total) by mouth every 6 (six) hours as needed for severe pain. 16 tablet 0   OZEMPIC, 1 MG/DOSE, 4 MG/3ML SOPN Inject 1 mg into the skin every Tuesday.     pantoprazole (PROTONIX) 20 MG tablet TAKE 2 TABLETS(40 MG) BY MOUTH DAILY 180 tablet 0   potassium chloride (KLOR-CON) 20 MEQ packet Take 20  mEq by mouth daily. 30 each 0   prednisoLONE acetate (PRED FORTE) 1 % ophthalmic suspension Place 1 drop into the right eye 4 (four) times daily.     rosuvastatin (CRESTOR) 40 MG tablet TAKE 1 TABLET(40 MG) BY MOUTH DAILY 90 tablet 3   zolpidem (AMBIEN) 10 MG tablet Take 10 mg by mouth at bedtime.     celecoxib (CELEBREX) 200 MG capsule Take 1 capsule (200 mg total) by mouth 2 (two) times daily. (Patient not taking: Reported on 12/23/2022) 28 capsule 1   ezetimibe (ZETIA) 10 MG tablet Take 1 tablet (10 mg total) by mouth daily. 90 tablet 3   lisinopril (ZESTRIL) 10 MG tablet Take 1 tablet (10 mg total) by mouth daily. 30 tablet 0   No facility-administered medications prior to visit.     Allergies:   Patient has no known allergies.   Social History   Socioeconomic History   Marital status: Single    Spouse name: Not on file   Number of children: 3   Years of education: Not on file   Highest education level: Some college, no degree  Occupational History   Occupation: Owner  Tobacco Use   Smoking status: Never   Smokeless tobacco: Never  Vaping Use   Vaping status: Never Used  Substance and Sexual Activity   Alcohol use: Yes    Alcohol/week: 10.0 standard drinks of alcohol    Types: 10 Standard drinks or equivalent per week    Comment: 2 drinks 5 nights per week   Drug use: No   Sexual activity: Never  Other Topics Concern   Not on file  Social History Narrative   Not on file   Social Determinants of Health    Financial Resource Strain: Low Risk  (10/29/2020)   Overall Financial Resource Strain (CARDIA)    Difficulty of Paying Living Expenses: Not hard at all  Food Insecurity: No Food Insecurity (10/29/2020)   Hunger Vital Sign    Worried About Running Out of Food in the Last Year: Never true    Ran Out of Food in the Last Year: Never true  Transportation Needs: No Transportation Needs (10/29/2020)   PRAPARE - Administrator, Civil Service (Medical): No    Lack of Transportation (Non-Medical): No  Physical Activity: Sufficiently Active (10/29/2020)   Exercise Vital Sign    Days of Exercise per Week: 3 days    Minutes of Exercise per Session: 60 min  Stress: No Stress Concern Present (02/15/2018)   [employer] of Occupational Health - Occupational Stress Questionnaire    Feeling of Stress : Only a little  Social Connections: Not on file    Additional social history is notable in that he is divorced x2.  He has 3 children and his oldest son lives in [place], works for [employer] and will be relocating with his promotion to [place].   His second son  had lived [place] and was married in [place] and in June 2020 he moved back to the [place] area.  His daughter works in [place] as a Emergency planning/management officer in a Education officer, environmental.  There is no tobacco use.  He drinks alcohol.  Family History:  The patient's family history includes Colon cancer in his paternal grandfather.  Mother died at age 8 with emphysema.  His father died at 7.  He has 2 sisters ages 80 and 20.  ROS General: Negative; No fevers, chills, or night sweats; positive for fatigue  HEENT: Decreased hearing with hearing aid in right ear, no sinus congestion, difficulty swallowing; right eyelid droop Pulmonary: Negative; No cough, wheezing, shortness of breath, hemoptysis Cardiovascular: See HPI GI: History of diverticular disease and colonic polyps. GU: Remote history of genital herpes; erectile dysfunction;  status post inflatable penile implant March 08, 2022 Musculoskeletal: Negative; no myalgias, joint pain, or weakness Hematologic/Oncology: Negative; no easy bruising, bleeding Endocrine: Positive for hypothyroidism Neuro: Negative; no changes in balance, headaches Skin: Negative; No rashes or skin lesions Psychiatric:  Sleep: Positive snoring snoring, previous fatigue; documented to have severe OSA with recommended treatment with BiPAP therapy; no bruxism, restless legs, hypnogognic hallucinations, no cataplexy He has not used BiPAP since 2021. Other comprehensive 14 point system review is negative.   PHYSICAL EXAM:   VS:  BP 118/60 (BP Location: Left Arm, Patient Position: Sitting, Cuff Size: Large)   Pulse (!) 55   Ht 5\' 7"  (1.702 m)   Wt 232 lb (105.2 kg)   SpO2 97%   BMI 36.34 kg/m     Repeat blood pressure by me was 114/60 , Wt Readings from Last 3 Encounters:  12/23/22 232 lb (105.2 kg)  06/22/22 200 lb (90.7 kg)  03/21/22 210 lb 6.4 oz (95.4 kg)    Since prior evaluation on Jun 22, 2022, 32 pound weight gain  General: Alert, oriented, no distress.  Skin: normal turgor, no rashes, warm and dry HEENT: Normocephalic, atraumatic. Pupils equal round and reactive to light; sclera anicteric; extraocular muscles intact; Nose without nasal septal hypertrophy Mouth/Parynx benign; Mallinpatti scale 3 Neck: No JVD, no carotid bruits; normal carotid upstroke Lungs: clear to ausculatation and percussion; no wheezing or rales Chest wall: without tenderness to palpitation Heart: PMI not displaced, RRR, s1 s2 normal, 1/6 systolic murmur, no diastolic murmur, no rubs, gallops, thrills, or heaves Abdomen: soft, nontender; no hepatosplenomehaly, BS+; abdominal aorta nontender and not dilated by palpation. Back: no CVA tenderness Pulses 2+ Musculoskeletal: full range of motion, normal strength, no joint deformities Extremities: no clubbing cyanosis or edema, Homan's sign negative   Neurologic: grossly nonfocal; Cranial nerves grossly wnl Psychologic: Normal mood and affect     Studies/Labs Reviewed:   EKG Interpretation Date/Time:  Friday December 23 2022 13:41:44 EST Ventricular Rate:  55 PR Interval:  188 QRS Duration:  86 QT Interval:  468 QTC Calculation: 447 R Axis:   14  Text Interpretation: Sinus bradycardia When compared with ECG of 25-Feb-2022 13:43, Fusion complexes are no longer Present Confirmed by Nicki Guadalajara ([id number]) on 01/02/2023 7:25:04 PM    Jun 22, 2022 ECG (independently read by me): Normal sinus rhythm at 69 bpm.  Normal intervals.  No significant ST-T change.  March 21, 2022 ECG (independently read by me): Sinus bradycardia at 49, RBBB  August 03, 2020 ECG (independently read by me):  Sinus bradycadia at 47; no ectopy    March 12, 2020 ECG (independently read by me): Sinus bradycardia 51 bpm. Possible left atrial enlargement. VH. No ectopy. Normal intervals.  June 2021 ECG (independently read by me):Sinus bradycardia at 53; QTc 437 msec  March 18, 2021ECG (independently read by me):Sinus bradycardia at 48; QTc 441 msec; No ST changes; no ectopy  January 7, 2021ECG (independently read by me): Sinus Bradycardia at 51; no STT changes; QTc 442 msec; no ectopy  January 16, 2019 ECG (independently read by me): Sinus bradycardia at 57 bpm with possible left atrial enlargement.  PR interval 176 ms, QTc interval 476 ms.  No ST segment changes.  January 02, 2019 ECG (independently read by me): Atrial flutter with 2-1 AV conduction with a ventricular rate of 130 bpm.  December 21, 2018 ECG (independently read by me): Atrial flutter with variable block at 115, QT 402,QTC 556 msec  Cardioversion November 20, 2018 with restoration of sinus rhythm.  November 15, 2018 ECG (independently read by me): Atrial Flutter with variable block at 97 bpm  September 2020 ECG (independently read by me): Atrial flutter with variable block with an average  heart rate at 115 bpm  October 29, 2018 ECG (independently read by me): Atrial flutter with variable block 130 bpm.  No specific ST-T changes  October 08, 2018 ECG (independently read by me): NSR at 60; isolated PVC; QTc 434 msec.  April 02, 2018 ECG (independently read by me): NSR 62; Nonspecific T change; QTc 452 msec  December 29, 2017 ECG (independently read by me): Sinus bradycardia at 49 bpm.  Nonspecific ST changes.  QTc interval 464 ms.  No ectopy.  ECG (independently read by me): Sinus rhythm at 64 bpm.  Right bundle branch block with repolarization changes.  No ectopy.  October 24, 2017 ECG (independently read by me): Normal sinus rhythm at 62 bpm.  Right bundle branch block with repolarization changes.   ECG  April 18, 2014 revealed normal sinus rhythm.  Right bundle branch block was not present.  Recent Labs:    Latest Ref Rng & Units 04/19/2022   10:37 AM 02/25/2022    1:30 PM 05/03/2021    3:04 PM  BMP  Glucose 70 - 99 mg/dL 96  098  74   BUN 8 - 27 mg/dL 21  30  17    Creatinine 0.76 - 1.27 mg/dL 1.19  1.47  8.29   BUN/Creat Ratio 10 - 24 16   17    Sodium 134 - 144 mmol/L 136  137  141   Potassium 3.5 - 5.2 mmol/L 4.4  4.1  4.7   Chloride 96 - 106 mmol/L 95  100  99   CO2 20 - 29 mmol/L 27  24  21    Calcium 8.6 - 10.2 mg/dL 56.2  9.2  9.8         Latest Ref Rng & Units 04/19/2022   10:37 AM 12/02/2020   10:02 PM 10/27/2020    5:08 PM  Hepatic Function  Total Protein 6.0 - 8.5 g/dL 6.7  7.0  6.7   Albumin 3.8 - 4.8 g/dL 4.4  4.2  3.9   AST 0 - 40 IU/L 30  57  24   ALT 0 - 44 IU/L 29  51  25   Alk Phosphatase 44 - 121 IU/L 110  103  113   Total Bilirubin 0.0 - 1.2 mg/dL 0.6  0.7  0.9        Latest Ref Rng & Units 04/19/2022   10:40 AM 02/25/2022    1:30 PM 12/02/2020   10:02 PM  CBC  WBC 3.4 - 10.8 x10E3/uL 7.3  8.4  12.0   Hemoglobin 13.0 - 17.7 g/dL 13.0  86.5  78.4   Hematocrit 37.5 - 51.0 % 41.5  44.8  41.3   Platelets 150 - 450 x10E3/uL 233   228  238    Lab Results  Component Value Date   MCV 98 (H) 04/19/2022   MCV 97.0 02/25/2022   MCV 93.0 12/02/2020   Lab Results  Component Value Date   TSH 2.640 04/19/2022   Lab Results  Component Value  Date   HGBA1C 5.5 02/25/2022     BNP    Component Value Date/Time   BNP 667.7 (H) 10/27/2020 1708    ProBNP No results found for: "PROBNP"   Lipid Panel     Component Value Date/Time   CHOL 100 04/19/2022 1040   TRIG 148 04/19/2022 1040   HDL 38 (L) 04/19/2022 1040   CHOLHDL 2.6 04/19/2022 1040   CHOLHDL 3.4 01/29/2007 0410   VLDL 17 01/29/2007 0410   LDLCALC 37 04/19/2022 1040     RADIOLOGY: No results found.    Additional studies/ records that were reviewed today include:  I reviewed his prior office visits and most recent DC cardioversion.  CONCLUSIONS:  1. Isthmus-dependent right atrial flutter upon presentation.  2. Successful radiofrequency ablation of atrial flutter along the cavotricuspid isthmus with complete bidirectional isthmus block achieved.  3. No inducible arrhythmias following ablation.  4. No early apparent complications.    Lewayne Bunting, MD  12:37 PM 01/11/2019    SPLIT NIGHT SLEEP STUDY: 01/25/2019 SLEEP STUDY TECHNIQUE As per the AASM Manual for the Scoring of Sleep and Associated Events v2.3 (April 2016) with a hypopnea requiring 4% desaturations.   The channels recorded and monitored were frontal, central and occipital EEG, electrooculogram (EOG), submentalis EMG (chin), nasal and oral airflow, thoracic and abdominal wall motion, anterior tibialis EMG, snore microphone, electrocardiogram, and pulse oximetry. Bi-level positive airway pressure (BiPAP) was initiated when the patient met split night criteria and was titrated according to treat sleep-disordered breathing.   RESPIRATORY PARAMETERS Diagnostic Total AHI (/hr):            63.5     RDI (/hr):         65.8     OA Index (/hr):            17.7     CA Index (/hr):       5.9 REM AHI (/hr):            N/A      NREM AHI (/hr):          63.5     Supine AHI (/hr):         63.5     Non-supine AHI (/hr):        N/A Min O2 Sat (%):          81.0     Mean O2 (%):  93.0     Time below 88% (min):           18.7        Titration Optimal IPAP Pressure (cm): 18        Optimal EPAP Pressure (cm):            14        AHI at Optimal Pressure (/hr):          3.6       Min O2 at Optimal Pressure (%):       93.0 Sleep % at Optimal (%):         97        Supine % at Optimal (%):       7             SLEEP ARCHITECTURE The study was initiated at 10:10:34 PM and terminated at 4:34:09 AM. The total recorded time was 383.6 minutes. EEG confirmed total sleep time was 333.3 minutes yielding a sleep efficiency of 86.9%%. Sleep onset after lights out was 0.8 minutes with  a REM latency of 195.0 minutes. The patient spent 1.5%% of the night in stage N1 sleep, 80.9%% in stage N2 sleep, 0.0%% in stage N3 and 17.6% in REM. Wake after sleep onset (WASO) was 49.5 minutes. The Arousal Index was 32.0/hour.   LEG MOVEMENT DATA The total Periodic Limb Movements of Sleep (PLMS) were 0. The PLMS index was 0.0 .   CARDIAC DATA The 2 lead EKG demonstrated sinus rhythm. The mean heart rate was 100.0 beats per minute. Other EKG findings include: None.   IMPRESSIONS - Severe obstructive sleep apnea occurred during the diagnostic portion of the study (AHI 63.5 /h; RDI 65.8/h). CPAP was initiated at 5 cm and was titrated to 9 cm . Due to frequent central events, BiPAP was implemented at 11/7 and was titrated to 18/14 cm of water: AHI 3.6/h, O2 nadir 93%. 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 - Moderate oxygen desaturation was noted during the diagnostic portion of the study (Min O2 = 81.0%). - The patient snored with loud snoring volume during the diagnostic portion of the study. - It appears that the patient was in atrial fibrillation throughtout  the study. - Clinically significant periodic limb movements of sleep did not occur during the study.   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 A Small size Fisher[employer]Paykel Full Face Mask Simplus mask was used for the titration. If patient continues to experience central events, ASV titration may be necessary. - Effort should be made to optimize nasal and oropharyngeal patency. - Avoid alcohol, sedatives and other CNS depressants that may worsen sleep apnea and disrupt normal sleep architecture. - Sleep hygiene should be reviewed to assess factors that may improve sleep quality. - Weight management and regular exercise should be initiated or continued. - Recoomend f/u cardiology evalution for probable recurrent atrial fibrillation.  - Recommend a download in 30 days and sleep clinic evaluation after 30 days of therapy.   [Electronically signed] 01/27/2019 04:13 PM   Nicki Guadalajara MD, [facility], ABSM Diplomate, American Board of Sleep Medicine    ASSESSMENT:    1. Coronary artery disease involving native coronary artery of native heart without angina pectoris   2. S/P CABG x 4   3. S/P ablation of atrial flutter   4. PAF (paroxysmal atrial fibrillation) (HCC)   5. Mixed hyperlipidemia   6. Hyperlipidemia LDL goal <55   7. Acquired hypothyroidism     PLAN:  Mr. 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  He retired at the end of December 2021.  He has a history of mild hyperlipidemia and remotely had been on simvastatin 40 mg daily and had been followed by Dr. Catha Gosselin.  In the fall 2019 he began to notice mild shortness of breath particularly with walking up steps.  An echo Doppler study revealed hyperdynamic LV function with an EF of 65 to 70% but with  grade 2 diastolic dysfunction and abnormal tissue Doppler.  A CT coronary angiogram was suggestive of significant multivessel CAD and was FFR positive.  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 Tyrone Sage.  His postoperative course  was complicated by paroxysmal atrial fibrillation for which he was started on amiodarone and ultimately was discharged on Eliquis.  When I saw him for initial post hospital evaluation he was still on amiodarone 200 mg twice a day, metoprolol tartrate 25 mg twice a day.  His ECG showed sinus bradycardia 5 weeks status post CABG revascularization; amiodarone was decreased to 200 mg daily and subsequently discontinued when subsequent laboratory showed LFT elevation and further increase in his TSH level.  With his hypothyroidism he required increasing doses of levothyroxine, ultimately titrated to his present dose of 150 mcg daily.  He  developed episodes of atrial flutter and underwent initial cardioversion in October 2020.  Due to recurrent atrial flutter he was ultimately referred to Dr. Ladona Ridgel and underwent successful a flutter ablation on January 12, 2019.  Due to concerns for obstructive sleep apnea he ultimately was able to have his sleep study which confirmed my suspicion and was found to have severe sleep apnea with an AHI of over 60 times per hour with significant oxygen desaturation to a nadir of 80%.  When I saw him for sleep and follow-up of his ablation in early January,  he has been on BiPAP therapy for 9 days and felt marked improvement in his previous fatigability and energy.   Of note he had run out of his metoprolol 2 days prior to his sleep study and he was back in atrial fibrillation during his sleep evaluation.  His ECG at his January visit on metoprolol 75 mg twice a day revealed  sinus rhythm with asymptomatic sinus bradycardia 51 bpm.  He  underwent successful right hip replacement surgery without cardiovascular compromise. He never reinstituted BiPAP therapy and has been off treatment since 2021.  His primary care provider retired and he is now followed by Carin Hock, PA at [facility].  He has been on chronic Eliquis for anticoagulation with his history of atrial fibrillation.   I had not seen him since June 2022 and saw him last on March 21, 2020.  On March 08, 2022 he underwent successful urologic surgery by Dr. Cheree Ditto mention with an inflatable penile implant.  He tolerated surgery without cardiovascular compromise.  At his February 2024 office visit, he was bradycardic and had low blood pressure secondary to inadvertently taking the wrong dose of metoprolol.  Time, I read recommended he resume his prior dose of 25 mg twice a day rather than what he was inadvertently taking 50 mg twice a day.  Presently, his blood pressure is stable and he is now taking lisinopril 10 mg daily, metoprolol tartrate 25 mg twice a day, as well as furosemide twice a day.  He is on levothyroxine 150 mcg.  He is unaware of any recurrent atrial fibrillation or flutter.  ECG today confirms sinus bradycardia at 55 bpm and he continues to be anticoagulated on Eliquis.  Lipids are stable with excellent LDL of 37 on his regimen of ezetimibe 10 mg, Vascepa 2 capsules twice a day, and rosuvastatin 40 mg daily.  He had been given a prescription for Ozempic but had not started this.  He will be trying to start this particularly with his recent weight gain of 32 pounds over the past 6 months.  He is exercising 5 days/week at the gym.  He believes he is sleeping adequately and has no interest in pursuing BiPAP.  I will see him in 6 months for reevaluation or sooner as needed.      Medication Adjustments/Labs and Tests  Ordered: Current medicines are reviewed at length with the patient today.  Concerns regarding medicines are outlined above.  Medication changes, Labs and Tests  ordered today are listed in the Patient Instructions below. Patient Instructions  Medication Instructions:  No changes *If you need a refill on your cardiac medications before your next appointment, please call your pharmacy*  Follow-Up: At [facility], you and your health needs are our priority.  As part of our continuing mission to provide you with exceptional heart care, we have created designated Provider Care Teams.  These Care Teams include your primary Cardiologist (physician) and Advanced Practice Providers (APPs -  Physician Assistants and Nurse Practitioners) who all work together to provide you with the care you need, when you need it.  We recommend signing up for the patient portal called "MyChart".  Sign up information is provided on this After Visit Summary.  MyChart is used to connect with patients for Virtual Visits (Telemedicine).  Patients are able to view lab/test results, encounter notes, upcoming appointments, etc.  Non-urgent messages can be sent to your provider as well.   To learn more about what you can do with MyChart, go to [email or web address].    Your next appointment:   6 month(s)  Provider:   Nicki Guadalajara, MD        Signed, Nicki Guadalajara, MD  01/02/2023 7:33 PM    [facility] [address], [place], [place]  [address] Phone: [phone]

## 2022-12-23 NOTE — Patient Instructions (Signed)
Medication Instructions:  No changes *If you need a refill on your cardiac medications before your next appointment, please call your pharmacy*  Follow-Up: At [facility], you and your health needs are our priority.  As part of our continuing mission to provide you with exceptional heart care, we have created designated Provider Care Teams.  These Care Teams include your primary Cardiologist (physician) and Advanced Practice Providers (APPs -  Physician Assistants and Nurse Practitioners) who all work together to provide you with the care you need, when you need it.  We recommend signing up for the patient portal called "MyChart".  Sign up information is provided on this After Visit Summary.  MyChart is used to connect with patients for Virtual Visits (Telemedicine).  Patients are able to view lab/test results, encounter notes, upcoming appointments, etc.  Non-urgent messages can be sent to your provider as well.   To learn more about what you can do with MyChart, go to [email or web address].    Your next appointment:   6 month(s)  Provider:   Nicki Guadalajara, MD

## 2023-01-02 ENCOUNTER — Encounter: Payer: Self-pay | Admitting: Cardiovascular Disease

## 2023-01-05 ENCOUNTER — Other Ambulatory Visit: Payer: Self-pay | Admitting: Cardiovascular Disease

## 2023-01-05 ENCOUNTER — Other Ambulatory Visit: Payer: Self-pay | Admitting: Internal Medicine

## 2023-01-10 DIAGNOSIS — H6123 Impacted cerumen, bilateral: Secondary | ICD-10-CM | POA: Diagnosis not present

## 2023-01-11 DIAGNOSIS — E669 Obesity, unspecified: Secondary | ICD-10-CM | POA: Diagnosis not present

## 2023-01-11 DIAGNOSIS — E291 Testicular hypofunction: Secondary | ICD-10-CM | POA: Diagnosis not present

## 2023-01-11 DIAGNOSIS — M859 Disorder of bone density and structure, unspecified: Secondary | ICD-10-CM | POA: Diagnosis not present

## 2023-01-11 DIAGNOSIS — E60 Dietary zinc deficiency: Secondary | ICD-10-CM | POA: Diagnosis not present

## 2023-01-11 DIAGNOSIS — I10 Essential (primary) hypertension: Secondary | ICD-10-CM | POA: Diagnosis not present

## 2023-01-11 DIAGNOSIS — D8940 Mast cell activation, unspecified: Secondary | ICD-10-CM | POA: Diagnosis not present

## 2023-01-11 DIAGNOSIS — R5382 Chronic fatigue, unspecified: Secondary | ICD-10-CM | POA: Diagnosis not present

## 2023-01-11 DIAGNOSIS — E559 Vitamin D deficiency, unspecified: Secondary | ICD-10-CM | POA: Diagnosis not present

## 2023-01-16 ENCOUNTER — Encounter (INDEPENDENT_AMBULATORY_CARE_PROVIDER_SITE_OTHER): Payer: PPO | Admitting: Ophthalmology

## 2023-01-23 ENCOUNTER — Telehealth: Payer: Self-pay | Admitting: Internal Medicine

## 2023-01-23 ENCOUNTER — Other Ambulatory Visit: Payer: Self-pay | Admitting: Internal Medicine

## 2023-01-23 NOTE — Telephone Encounter (Signed)
PT is calling to get a refill on pantoprazole sent to [facility] on [address]

## 2023-01-24 MED ORDER — PANTOPRAZOLE SODIUM 20 MG PO TBEC: 20.0000 mg | DELAYED_RELEASE_TABLET | Freq: Two times a day (BID) | ORAL | 0 refills | Status: DC

## 2023-01-24 NOTE — Telephone Encounter (Signed)
Refilled pantoprazole.

## 2023-01-26 ENCOUNTER — Other Ambulatory Visit: Payer: Self-pay | Admitting: Cardiovascular Disease

## 2023-02-13 ENCOUNTER — Encounter (INDEPENDENT_AMBULATORY_CARE_PROVIDER_SITE_OTHER): Payer: PPO | Admitting: Ophthalmology

## 2023-02-20 ENCOUNTER — Encounter (INDEPENDENT_AMBULATORY_CARE_PROVIDER_SITE_OTHER): Payer: Self-pay

## 2023-02-20 ENCOUNTER — Encounter (INDEPENDENT_AMBULATORY_CARE_PROVIDER_SITE_OTHER): Payer: PPO | Admitting: Ophthalmology

## 2023-03-20 ENCOUNTER — Other Ambulatory Visit: Payer: Self-pay | Admitting: Internal Medicine

## 2023-03-22 ENCOUNTER — Other Ambulatory Visit: Payer: Self-pay | Admitting: Cardiovascular Disease

## 2023-03-28 ENCOUNTER — Encounter (INDEPENDENT_AMBULATORY_CARE_PROVIDER_SITE_OTHER): Payer: PPO | Admitting: Ophthalmology

## 2023-03-28 ENCOUNTER — Encounter (INDEPENDENT_AMBULATORY_CARE_PROVIDER_SITE_OTHER): Payer: Self-pay

## 2023-03-28 DIAGNOSIS — H35033 Hypertensive retinopathy, bilateral: Secondary | ICD-10-CM | POA: Diagnosis not present

## 2023-03-28 DIAGNOSIS — I10 Essential (primary) hypertension: Secondary | ICD-10-CM | POA: Diagnosis not present

## 2023-03-28 DIAGNOSIS — H353221 Exudative age-related macular degeneration, left eye, with active choroidal neovascularization: Secondary | ICD-10-CM | POA: Diagnosis not present

## 2023-03-28 DIAGNOSIS — H353112 Nonexudative age-related macular degeneration, right eye, intermediate dry stage: Secondary | ICD-10-CM | POA: Diagnosis not present

## 2023-03-28 DIAGNOSIS — H43813 Vitreous degeneration, bilateral: Secondary | ICD-10-CM

## 2023-04-11 ENCOUNTER — Other Ambulatory Visit: Payer: Self-pay | Admitting: Cardiovascular Disease

## 2023-04-13 ENCOUNTER — Ambulatory Visit: Payer: PPO | Admitting: Internal Medicine

## 2023-04-18 DIAGNOSIS — M6281 Muscle weakness (generalized): Secondary | ICD-10-CM | POA: Diagnosis not present

## 2023-04-18 DIAGNOSIS — R2689 Other abnormalities of gait and mobility: Secondary | ICD-10-CM | POA: Diagnosis not present

## 2023-04-18 DIAGNOSIS — M5451 Vertebrogenic low back pain: Secondary | ICD-10-CM | POA: Diagnosis not present

## 2023-04-25 ENCOUNTER — Encounter (INDEPENDENT_AMBULATORY_CARE_PROVIDER_SITE_OTHER): Payer: PPO | Admitting: Ophthalmology

## 2023-04-25 DIAGNOSIS — R2689 Other abnormalities of gait and mobility: Secondary | ICD-10-CM | POA: Diagnosis not present

## 2023-04-25 DIAGNOSIS — M5451 Vertebrogenic low back pain: Secondary | ICD-10-CM | POA: Diagnosis not present

## 2023-04-25 DIAGNOSIS — M6281 Muscle weakness (generalized): Secondary | ICD-10-CM | POA: Diagnosis not present

## 2023-04-27 DIAGNOSIS — M5451 Vertebrogenic low back pain: Secondary | ICD-10-CM | POA: Diagnosis not present

## 2023-04-27 DIAGNOSIS — M6281 Muscle weakness (generalized): Secondary | ICD-10-CM | POA: Diagnosis not present

## 2023-04-27 DIAGNOSIS — R2689 Other abnormalities of gait and mobility: Secondary | ICD-10-CM | POA: Diagnosis not present

## 2023-04-28 DIAGNOSIS — J3489 Other specified disorders of nose and nasal sinuses: Secondary | ICD-10-CM | POA: Diagnosis not present

## 2023-04-28 DIAGNOSIS — R0981 Nasal congestion: Secondary | ICD-10-CM | POA: Diagnosis not present

## 2023-04-28 DIAGNOSIS — H6123 Impacted cerumen, bilateral: Secondary | ICD-10-CM | POA: Diagnosis not present

## 2023-04-28 DIAGNOSIS — J029 Acute pharyngitis, unspecified: Secondary | ICD-10-CM | POA: Diagnosis not present

## 2023-05-03 DIAGNOSIS — L03116 Cellulitis of left lower limb: Secondary | ICD-10-CM | POA: Diagnosis not present

## 2023-05-08 DIAGNOSIS — L089 Local infection of the skin and subcutaneous tissue, unspecified: Secondary | ICD-10-CM | POA: Diagnosis not present

## 2023-05-08 DIAGNOSIS — L03116 Cellulitis of left lower limb: Secondary | ICD-10-CM | POA: Diagnosis not present

## 2023-05-09 DIAGNOSIS — J029 Acute pharyngitis, unspecified: Secondary | ICD-10-CM | POA: Diagnosis not present

## 2023-06-05 DIAGNOSIS — G4733 Obstructive sleep apnea (adult) (pediatric): Secondary | ICD-10-CM | POA: Diagnosis not present

## 2023-06-05 DIAGNOSIS — I25119 Atherosclerotic heart disease of native coronary artery with unspecified angina pectoris: Secondary | ICD-10-CM | POA: Diagnosis not present

## 2023-06-05 DIAGNOSIS — E1129 Type 2 diabetes mellitus with other diabetic kidney complication: Secondary | ICD-10-CM | POA: Diagnosis not present

## 2023-06-05 DIAGNOSIS — E782 Mixed hyperlipidemia: Secondary | ICD-10-CM | POA: Diagnosis not present

## 2023-06-05 DIAGNOSIS — I2510 Atherosclerotic heart disease of native coronary artery without angina pectoris: Secondary | ICD-10-CM | POA: Diagnosis not present

## 2023-06-05 DIAGNOSIS — Z6835 Body mass index (BMI) 35.0-35.9, adult: Secondary | ICD-10-CM | POA: Diagnosis not present

## 2023-06-05 DIAGNOSIS — R809 Proteinuria, unspecified: Secondary | ICD-10-CM | POA: Diagnosis not present

## 2023-06-05 DIAGNOSIS — I509 Heart failure, unspecified: Secondary | ICD-10-CM | POA: Diagnosis not present

## 2023-06-05 DIAGNOSIS — I480 Paroxysmal atrial fibrillation: Secondary | ICD-10-CM | POA: Diagnosis not present

## 2023-06-08 DIAGNOSIS — M20012 Mallet finger of left finger(s): Secondary | ICD-10-CM | POA: Diagnosis not present

## 2023-06-09 DIAGNOSIS — S62633K Displaced fracture of distal phalanx of left middle finger, subsequent encounter for fracture with nonunion: Secondary | ICD-10-CM | POA: Diagnosis not present

## 2023-06-09 DIAGNOSIS — X58XXXA Exposure to other specified factors, initial encounter: Secondary | ICD-10-CM | POA: Diagnosis not present

## 2023-06-09 DIAGNOSIS — Y999 Unspecified external cause status: Secondary | ICD-10-CM | POA: Diagnosis not present

## 2023-06-09 DIAGNOSIS — S66313A Strain of extensor muscle, fascia and tendon of left middle finger at wrist and hand level, initial encounter: Secondary | ICD-10-CM | POA: Diagnosis not present

## 2023-06-09 DIAGNOSIS — M20012 Mallet finger of left finger(s): Secondary | ICD-10-CM | POA: Diagnosis not present

## 2023-06-12 DIAGNOSIS — M79645 Pain in left finger(s): Secondary | ICD-10-CM | POA: Diagnosis not present

## 2023-06-22 DIAGNOSIS — M6789 Other specified disorders of synovium and tendon, multiple sites: Secondary | ICD-10-CM | POA: Diagnosis not present

## 2023-06-22 DIAGNOSIS — M20012 Mallet finger of left finger(s): Secondary | ICD-10-CM | POA: Diagnosis not present

## 2023-06-28 ENCOUNTER — Other Ambulatory Visit: Payer: Self-pay | Admitting: Internal Medicine

## 2023-06-29 ENCOUNTER — Ambulatory Visit: Payer: PPO | Admitting: Internal Medicine

## 2023-07-05 DIAGNOSIS — L570 Actinic keratosis: Secondary | ICD-10-CM | POA: Diagnosis not present

## 2023-07-05 DIAGNOSIS — L821 Other seborrheic keratosis: Secondary | ICD-10-CM | POA: Diagnosis not present

## 2023-07-05 DIAGNOSIS — D225 Melanocytic nevi of trunk: Secondary | ICD-10-CM | POA: Diagnosis not present

## 2023-07-06 ENCOUNTER — Other Ambulatory Visit: Payer: Self-pay | Admitting: Cardiovascular Disease

## 2023-07-08 DIAGNOSIS — E669 Obesity, unspecified: Secondary | ICD-10-CM | POA: Diagnosis not present

## 2023-07-08 DIAGNOSIS — I480 Paroxysmal atrial fibrillation: Secondary | ICD-10-CM | POA: Diagnosis not present

## 2023-07-08 DIAGNOSIS — E1159 Type 2 diabetes mellitus with other circulatory complications: Secondary | ICD-10-CM | POA: Diagnosis not present

## 2023-07-08 DIAGNOSIS — E785 Hyperlipidemia, unspecified: Secondary | ICD-10-CM | POA: Diagnosis not present

## 2023-07-13 DIAGNOSIS — M6789 Other specified disorders of synovium and tendon, multiple sites: Secondary | ICD-10-CM | POA: Diagnosis not present

## 2023-07-13 DIAGNOSIS — M20012 Mallet finger of left finger(s): Secondary | ICD-10-CM | POA: Diagnosis not present

## 2023-07-13 DIAGNOSIS — M65332 Trigger finger, left middle finger: Secondary | ICD-10-CM | POA: Diagnosis not present

## 2023-07-27 DIAGNOSIS — M20012 Mallet finger of left finger(s): Secondary | ICD-10-CM | POA: Diagnosis not present

## 2023-07-28 DIAGNOSIS — J3489 Other specified disorders of nose and nasal sinuses: Secondary | ICD-10-CM | POA: Diagnosis not present

## 2023-07-28 DIAGNOSIS — K219 Gastro-esophageal reflux disease without esophagitis: Secondary | ICD-10-CM | POA: Diagnosis not present

## 2023-07-28 DIAGNOSIS — H6123 Impacted cerumen, bilateral: Secondary | ICD-10-CM | POA: Diagnosis not present

## 2023-08-07 DIAGNOSIS — E1159 Type 2 diabetes mellitus with other circulatory complications: Secondary | ICD-10-CM | POA: Diagnosis not present

## 2023-08-07 DIAGNOSIS — I480 Paroxysmal atrial fibrillation: Secondary | ICD-10-CM | POA: Diagnosis not present

## 2023-08-07 DIAGNOSIS — E669 Obesity, unspecified: Secondary | ICD-10-CM | POA: Diagnosis not present

## 2023-08-07 DIAGNOSIS — E785 Hyperlipidemia, unspecified: Secondary | ICD-10-CM | POA: Diagnosis not present

## 2023-08-08 DIAGNOSIS — I509 Heart failure, unspecified: Secondary | ICD-10-CM | POA: Diagnosis not present

## 2023-08-08 DIAGNOSIS — Z6834 Body mass index (BMI) 34.0-34.9, adult: Secondary | ICD-10-CM | POA: Diagnosis not present

## 2023-08-08 DIAGNOSIS — E669 Obesity, unspecified: Secondary | ICD-10-CM | POA: Diagnosis not present

## 2023-08-08 DIAGNOSIS — E1129 Type 2 diabetes mellitus with other diabetic kidney complication: Secondary | ICD-10-CM | POA: Diagnosis not present

## 2023-08-30 ENCOUNTER — Encounter (HOSPITAL_COMMUNITY): Payer: Self-pay

## 2023-08-30 ENCOUNTER — Other Ambulatory Visit: Payer: Self-pay

## 2023-08-30 ENCOUNTER — Emergency Department (HOSPITAL_COMMUNITY)
Admission: EM | Admit: 2023-08-30 | Discharge: 2023-08-31 | Discharge status: Against Medical Advice | Source: Ambulatory Visit

## 2023-08-30 DIAGNOSIS — M20012 Mallet finger of left finger(s): Secondary | ICD-10-CM | POA: Diagnosis not present

## 2023-08-30 DIAGNOSIS — Z5321 Procedure and treatment not carried out due to patient leaving prior to being seen by health care provider: Secondary | ICD-10-CM | POA: Insufficient documentation

## 2023-08-30 DIAGNOSIS — I4891 Unspecified atrial fibrillation: Secondary | ICD-10-CM | POA: Diagnosis not present

## 2023-08-30 DIAGNOSIS — G8918 Other acute postprocedural pain: Secondary | ICD-10-CM | POA: Diagnosis not present

## 2023-08-30 LAB — CBC WITH DIFFERENTIAL/PLATELET
Abs Immature Granulocytes: 0.04 K/uL (ref 0.00–0.07)
Basophils Absolute: 0.1 K/uL (ref 0.0–0.1)
Basophils Relative: 1 %
Eosinophils Absolute: 0.2 K/uL (ref 0.0–0.5)
Eosinophils Relative: 2 %
HCT: 54.2 % — ABNORMAL HIGH (ref 39.0–52.0)
Hemoglobin: 19 g/dL — ABNORMAL HIGH (ref 13.0–17.0)
Immature Granulocytes: 0 %
Lymphocytes Relative: 19 %
Lymphs Abs: 1.8 K/uL (ref 0.7–4.0)
MCH: 33.7 pg (ref 26.0–34.0)
MCHC: 35.1 g/dL (ref 30.0–36.0)
MCV: 96.3 fL (ref 80.0–100.0)
Monocytes Absolute: 1.6 K/uL — ABNORMAL HIGH (ref 0.1–1.0)
Monocytes Relative: 17 %
Neutro Abs: 5.5 K/uL (ref 1.7–7.7)
Neutrophils Relative %: 61 %
Platelets: 222 K/uL (ref 150–400)
RBC: 5.63 MIL/uL (ref 4.22–5.81)
RDW: 12.9 % (ref 11.5–15.5)
WBC: 9.2 K/uL (ref 4.0–10.5)
nRBC: 0.2 % (ref 0.0–0.2)

## 2023-08-30 LAB — COMPREHENSIVE METABOLIC PANEL WITH GFR
ALT: 62 U/L — ABNORMAL HIGH (ref 0–44)
AST: 62 U/L — ABNORMAL HIGH (ref 15–41)
Albumin: 4.4 g/dL (ref 3.5–5.0)
Alkaline Phosphatase: 105 U/L (ref 38–126)
Anion gap: 15 (ref 5–15)
BUN: 21 mg/dL (ref 8–23)
CO2: 22 mmol/L (ref 22–32)
Calcium: 9.5 mg/dL (ref 8.9–10.3)
Chloride: 99 mmol/L (ref 98–111)
Creatinine, Ser: 1.42 mg/dL — ABNORMAL HIGH (ref 0.61–1.24)
GFR, Estimated: 50 mL/min — ABNORMAL LOW (ref >60)
Glucose, Bld: 136 mg/dL — ABNORMAL HIGH (ref 70–99)
Potassium: 3.6 mmol/L (ref 3.5–5.1)
Sodium: 136 mmol/L (ref 135–145)
Total Bilirubin: 1 mg/dL (ref 0.0–1.2)
Total Protein: 8.3 g/dL — ABNORMAL HIGH (ref 6.5–8.1)

## 2023-08-30 LAB — TSH: TSH: 2.385 u[IU]/mL (ref 0.350–4.500)

## 2023-08-30 NOTE — ED Triage Notes (Signed)
 Pt states that his watch told him that he had afib so he came in. Pt reports having shob for a while now.

## 2023-08-30 NOTE — ED Notes (Signed)
 Patient left AMA at this time. Patient stated I feel fine I want to leave I was only here because of my watch Provider Terrall, PA at nurses station when he decided to walk out.

## 2023-08-30 NOTE — ED Provider Triage Note (Cosign Needed)
 Emergency Medicine Provider Triage Evaluation Note  Carl [name] , a 78 y.o. male  was evaluated in triage.  Pt complains of AFIB. Has had heart surgery, multiple bypasses. Patient states he is currently on eliquis  due to previous bypasses. Patient denies any other acute complaint other than that his watch told him he was in AFIB. Patient states that this morning he did take some moundrops earlier today which is an OTC weight loss supplement. Patient states he is compliant with his eliquis .   Review of Systems  Positive: Irregular heart beat Negative: Fever, chills, shortness of breath, chest pain  Physical Exam  BP (!) 150/88   Pulse (!) 114   Resp 15   SpO2 91%  Gen:   Awake, no distress   Resp:  Normal effort  MSK:   Moves extremities without difficulty  Other:  Irregular heartbeat on auscultation, no obvious abnormalities with lung auscultation  Medical Decision Making  Medically screening exam initiated at 6:58 PM.  Appropriate orders placed.  Carl [name] was informed that the remainder of the evaluation will be completed by another provider, this initial triage assessment does not replace that evaluation, and the importance of remaining in the ED until their evaluation is complete.  Orders: CBC, BMP, EKG, TSH   Carl [name], [place] 08/30/23 1932

## 2023-09-07 DIAGNOSIS — E1159 Type 2 diabetes mellitus with other circulatory complications: Secondary | ICD-10-CM | POA: Diagnosis not present

## 2023-09-07 DIAGNOSIS — E669 Obesity, unspecified: Secondary | ICD-10-CM | POA: Diagnosis not present

## 2023-09-07 DIAGNOSIS — E785 Hyperlipidemia, unspecified: Secondary | ICD-10-CM | POA: Diagnosis not present

## 2023-09-07 DIAGNOSIS — I480 Paroxysmal atrial fibrillation: Secondary | ICD-10-CM | POA: Diagnosis not present

## 2023-09-25 ENCOUNTER — Other Ambulatory Visit: Payer: Self-pay | Admitting: *Deleted

## 2023-09-25 DIAGNOSIS — I4891 Unspecified atrial fibrillation: Secondary | ICD-10-CM

## 2023-09-25 MED ORDER — APIXABAN 5 MG PO TABS: 5.0000 mg | ORAL_TABLET | Freq: Two times a day (BID) | ORAL | 1 refills | Status: AC

## 2023-09-25 NOTE — Telephone Encounter (Signed)
 Eliquis  5mg  refill request received. Patient is 78 years old, weight-105.2kg, Crea-1.42 on 08/30/23, Diagnosis-Afib, and last seen by Dr. Burnard on 12/23/22 and has an appt with Dr. Anner on 10/17/23. Dose is appropriate based on dosing criteria. Will send in refill to requested pharmacy.

## 2023-10-03 DIAGNOSIS — H353221 Exudative age-related macular degeneration, left eye, with active choroidal neovascularization: Secondary | ICD-10-CM | POA: Diagnosis not present

## 2023-10-03 DIAGNOSIS — H40051 Ocular hypertension, right eye: Secondary | ICD-10-CM | POA: Diagnosis not present

## 2023-10-03 DIAGNOSIS — H353112 Nonexudative age-related macular degeneration, right eye, intermediate dry stage: Secondary | ICD-10-CM | POA: Diagnosis not present

## 2023-10-03 DIAGNOSIS — H524 Presbyopia: Secondary | ICD-10-CM | POA: Diagnosis not present

## 2023-10-03 DIAGNOSIS — T8579XS Infection and inflammatory reaction due to other internal prosthetic devices, implants and grafts, sequela: Secondary | ICD-10-CM | POA: Diagnosis not present

## 2023-10-07 NOTE — ED Provider Notes (Signed)
 Late entry from visit on 08/30/23.  Attestation: Medical screening examination/treatment/procedure(s) were performed by non-physician practitioner and as supervising physician I was immediately available for consultation/collaboration.    Towana Ozell BROCKS, MD 10/07/23 [phone]

## 2023-10-08 DIAGNOSIS — E1159 Type 2 diabetes mellitus with other circulatory complications: Secondary | ICD-10-CM | POA: Diagnosis not present

## 2023-10-08 DIAGNOSIS — E785 Hyperlipidemia, unspecified: Secondary | ICD-10-CM | POA: Diagnosis not present

## 2023-10-08 DIAGNOSIS — E669 Obesity, unspecified: Secondary | ICD-10-CM | POA: Diagnosis not present

## 2023-10-08 DIAGNOSIS — I480 Paroxysmal atrial fibrillation: Secondary | ICD-10-CM | POA: Diagnosis not present

## 2023-10-17 ENCOUNTER — Ambulatory Visit: Attending: Cardiology | Admitting: Cardiology

## 2023-10-17 ENCOUNTER — Encounter: Payer: Self-pay | Admitting: Cardiology

## 2023-10-17 VITALS — BP 138/82 | HR 56 | Ht 67.0 in | Wt 220.2 lb

## 2023-10-17 DIAGNOSIS — I200 Unstable angina: Secondary | ICD-10-CM

## 2023-10-17 DIAGNOSIS — I480 Paroxysmal atrial fibrillation: Secondary | ICD-10-CM

## 2023-10-17 DIAGNOSIS — I483 Typical atrial flutter: Secondary | ICD-10-CM

## 2023-10-17 DIAGNOSIS — E785 Hyperlipidemia, unspecified: Secondary | ICD-10-CM | POA: Diagnosis not present

## 2023-10-17 DIAGNOSIS — I25119 Atherosclerotic heart disease of native coronary artery with unspecified angina pectoris: Secondary | ICD-10-CM

## 2023-10-17 DIAGNOSIS — N1831 Chronic kidney disease, stage 3a: Secondary | ICD-10-CM | POA: Diagnosis not present

## 2023-10-17 DIAGNOSIS — G4733 Obstructive sleep apnea (adult) (pediatric): Secondary | ICD-10-CM | POA: Diagnosis not present

## 2023-10-17 DIAGNOSIS — I10 Essential (primary) hypertension: Secondary | ICD-10-CM

## 2023-10-17 DIAGNOSIS — Z951 Presence of aortocoronary bypass graft: Secondary | ICD-10-CM

## 2023-10-17 DIAGNOSIS — I5032 Chronic diastolic (congestive) heart failure: Secondary | ICD-10-CM | POA: Diagnosis not present

## 2023-10-17 NOTE — Assessment & Plan Note (Signed)
 CABG in 2019 with most recent Myoview  in December 2020.  Would be due for ischemic evaluation this year anyway, but with now worsening exertional dyspnea symptoms that were similar to what led to the initial evaluation, recommendation would be to proceed with invasive valuation cardiac catheterization over Myoview .  Would not necessarily believe a negative Myoview .

## 2023-10-17 NOTE — Patient Instructions (Addendum)
 Medication Instructions:   No changes  *If you need a refill on your cardiac medications before your next appointment, please call your pharmacy*   Lab Work: BMP CBC If you have labs (blood work) drawn today and your tests are completely normal, you will receive your results only by: MyChart Message (if you have MyChart) OR A paper copy in the mail If you have any lab test that is abnormal or we need to change your treatment, we will call you to review the results.   Testing/Procedures:  1) Your physician has requested that you have an echocardiogram. Echocardiography is a painless test that uses sound waves to create images of your heart. It provides your doctor with information about the size and shape of your heart and how well your heart's chambers and valves are working. This procedure takes approximately one hour. There are no restrictions for this procedure. Please do NOT wear cologne, perfume, aftershave, or lotions (deodorant is allowed). Please arrive 15 minutes prior to your appointment time.  Please note: We ask at that you not bring children with you during ultrasound (echo/ vascular) testing. Due to room size and safety concerns, children are not allowed in the ultrasound rooms during exams. Our front office staff cannot provide observation of children in our lobby area while testing is being conducted. An adult accompanying a patient to their appointment will only be allowed in the ultrasound room at the discretion of the ultrasound technician under special circumstances. We apologize for any inconvenience.  2) Your physician has requested that you have a  right and left cardiac catheterization. Cardiac catheterization is used to diagnose and/or treat various heart conditions. Doctors may recommend this procedure for a number of different reasons. The most common reason is to evaluate chest pain. Chest pain can be a symptom of coronary artery disease (CAD), and cardiac  catheterization can show whether plaque is narrowing or blocking your heart's arteries. This procedure is also used to evaluate the valves, as well as measure the blood flow and oxygen levels in different parts of your heart. Please follow instruction sheet, as given.    Follow-Up: At [facility], you and your health needs are our priority.  As part of our continuing mission to provide you with exceptional heart care, we have created designated Provider Care Teams.  These Care Teams include your primary Cardiologist (physician) and Advanced Practice Providers (APPs -  Physician Assistants and Nurse Practitioners) who all work together to provide you with the care you need, when you need it.     Your next appointment:    2 to 3 week(s)  The format for your next appointment:   In Person  Provider:   Alm Clay, MD   Other Instructions   [facility] A DEPT OF [place] [address] A DEPT OF THE [place]. [facility] [address] [place] [address] Dept: [phone] Loc: [phone]  Carl [name]  10/17/2023  You are scheduled for a Right and Left Cardiac Catheterization on Wednesday, September 17 with Dr. Alm [name].  1. Please arrive at the [facility] (Main Entrance A) at [facility]: [address] [place], [place] [address] at 6:30 AM (This time is 2 hour(s) before your procedure to ensure your preparation).   Free valet parking service is available. You will check in at ADMITTING. The support person will be asked to wait in the waiting room.  It is  OK to have someone drop you off and come back when you are ready to be discharged.    Special note: Every effort is made to have your procedure done on time. Please understand that emergencies sometimes delay scheduled procedures.  2. Diet: Nothing to eat after midnight.   3. Hydration: Nothing to eat and drink after midnight. (For TEE and Cath the same day)  4.  Labs: You will need to have blood drawn on  CBC, BMP,   at [facility] (1st Floor), [address] [place], [place] [address]. You do not need to be fasting.  5. Medication instructions in preparation for your procedure:   Contrast Allergy: No     Stop taking Eliquis  (Apixiban) on Sunday, September 14.  Stop taking, Lisinopril  (Zestril  or Prinivil ) Wednesday, September 17,,   Lasix  (Furosemide )  Wednesday, September 17,  DO not take Ozempic  after Wednesday  9/10 /25  Do not take Diabetes Med Glucophage (Metformin) on the day of the procedure and HOLD 48 HOURS AFTER THE PROCEDURE., [place] Sunday, September 14,  On the morning of your procedure, take your Aspirin  81 mg and any morning medicines NOT listed above.  You may use sips of water .  6. Plan to go home the same day, you will only stay overnight if medically necessary. 7. Bring a current list of your medications and current insurance cards. 8. You MUST have a responsible person to drive you home. 9. Someone MUST be with you the first 24 hours after you arrive home or your discharge will be delayed. 10. Please wear clothes that are easy to get on and off and wear slip-on shoes.  Thank you for allowing us  to care for you!   -- Carl [name] Invasive Cardi[facility] services

## 2023-10-17 NOTE — H&P (View-Only) (Signed)
 Cardiology Office Note:  .   Date:  10/17/2023  ID:  [name], DOB 1945-07-03, MRN [id number] PCP: Sheldon Netter, PA  [facility] Providers Cardiologist:  Alm Clay, MD Cardiology APP:  Madie Jon Garre, [place]     Chief Complaint  Patient presents with   Follow-up    Delayed 63-month follow-up. ->  Establish new cardiologist (former Dr. Burnard patient)   Coronary Artery Disease    Exertional dyspnea worse over the last couple months.  No chest pain   Cardiomyopathy    No PND orthopnea or edema   Atrial Fibrillation    Had a ER visit for A-fib RVR but did not stay.  Otherwise has not symptoms.    Patient Profile: SABRA     DEADRICK STIDD is a  78 y.o. male  with a PMH reviewed below who presents here for delayed 84-month follow-up and to establish care with new cardiologist after Dr.'s retirement.   He returned at the request of Sheldon Netter, [place].  CAD-CABG x 4 (October 2019-Dr. Army): LIMA-LAD, SVG-OM1-dCx, SVG-rPDA Postop A-fib PAF -> on Eliquis  5 mg twice daily Did not tolerate 50 mg twice daily metoprolol  tartrate No longer on amiodarone  Atrial flutter status post flutter ablation December 2020 HFpEF-hospitalized in September 2022 On furosemide  40 mg a.m. and 20 mg p.m. On lisinopril  10 mg daily for afterload reduction History of TIA HLD: Rosuvastatin  40 mg daily and Vascepa  2 g twice daily plus Zetia  10 mg daily DM-2-metformin and (had been on Ozempic) Severe OSA (December 2020) => transition to BiPAP Hypothyroidism-on Synthroid      ARVINE CLAYBURN was last seen on December 23, 2022 as a follow-up visit with Dr. Burnard.  Noted that he is unaware of any A-fib or flutter.  Apparently had been given a prescription for Ozempic but not yet started.  This was likely because of a 30 pound weight gain over 6 months.  Was still exercising 5 days a week at the gym.  Sleeping better with BiPAP.  He went to the emergency room on 08/30/2023 with concerns regarding A-fib but  left after not being seen by the MD.  He is EKG showed A-fib with a rate of 117 bpm with aberrantly conducted beats versus PVCs  Subjective  Discussed the use of AI scribe software for clinical note transcription with the patient, who gave verbal consent to proceed.  History of Present Illness EGAN BERKHEIMER is a 78 year old male with atrial fibrillation and coronary artery disease who presents with progressive shortness of breath.  He has been experiencing progressive shortness of breath since the summer, initially attributing it to hot weather. He becomes completely out of breath after walking short distances, such as from his car to the house or to the elevator. He denies chest pain but reports very mild, occasional indigestion-like symptoms when walking. He denies swelling in his legs. He does not report dizziness, though sometimes he needs to lean against a wall. He also denies waking up short of breath or experiencing shortness of breath when lying flat.  He has a history of atrial fibrillation, first noted after coronary artery bypass surgery in October 2019. He is currently taking Eliquis  5 mg twice a day and metoprolol  25 mg twice a day. His watch sometimes alerts him to episodes of atrial fibrillation, but he has not felt any recent episodes.  He underwent coronary artery bypass surgery in October 2019 after a coronary CT scan and heart catheterization  revealed multiple arterial blockages. He has not experienced significant chest pain or angina, but reports mild indigestion-like symptoms occasionally when walking.  He has a history of sleep apnea but has not used his BiPAP machine for over a week due to discomfort with the mask. He reports sleeping well at night, aided by Ambien  and Tylenol  PM, and feels rested upon waking.  He is on several medications for his heart and blood pressure, including lisinopril  10 mg daily and Zetia  10 mg. He also takes Ozempic and metformin for diabetes  management, having lost 12 pounds since starting Ozempic. His last A1c was 5.4, indicating good diabetes control.  He has not taken his furosemide  for two to three months due to urinary urgency issues. He has not noticed significant weight changes recently.  He recalls being hospitalized for heart failure in September 2022, where he was treated with diuretics. He does not remember the specific symptoms from that time but notes that his current shortness of breath is a new experience.   Cardiovascular ROS: positive for - dyspnea on exertion, irregular heartbeat, shortness of breath, and more prominent exertional dyspnea with exercise intolerance and fatigue. negative for - chest pain, edema, loss of consciousness, orthopnea, palpitations, paroxysmal nocturnal dyspnea, rapid heart rate, or lightheadedness, dizziness or wooziness, syncope or near syncope or TIA/CVA or amaurosis fugax  ROS:  Review of Systems - Negative except symptoms noted above.  No melena, hematochezia, hematuria or epistaxis.    Objective   Current Meds  CV medications Sig   apixaban  (ELIQUIS ) 5 MG TABS tablet Take 1 tablet (5 mg total) by mouth 2 (two) times daily.   aspirin  EC 81 MG tablet Take 1 tablet (81 mg total) by mouth daily.   ezetimibe  (ZETIA ) 10 MG tablet Take 1 tablet (10 mg total) by mouth daily.   FARXIGA 10 MG TABS tablet Take 10 mg by mouth daily.   ferrous sulfate  325 (65 FE) MG tablet Take 325 mg by mouth daily.   furosemide  (LASIX ) 40 MG tablet TAKE 1 TABLET BY MOUTH EVERY MORNING AND 1/2 TABLET EVERY EVENING   icosapent  Ethyl (VASCEPA ) 1 g capsule TAKE 2 CAPSULES(2 GRAMS) BY MOUTH TWICE DAILY   lisinopril  (ZESTRIL ) 10 MG tablet Take 1 tablet (10 mg total) by mouth daily.   metFORMIN (GLUCOPHAGE-XR) 500 MG 24 hr tablet Take 500 mg by mouth in the morning and at bedtime.   metoprolol  tartrate (LOPRESSOR ) 25 MG tablet Take 1 tablet (25 mg total) by mouth 2 (two) times daily.   OZEMPIC, 1 MG/DOSE, 4  MG/3ML SOPN Inject 1 mg into the skin every Tuesday.   potassium chloride  (KLOR-CON ) 20 MEQ packet Take 20 mEq by mouth daily.   rosuvastatin  (CRESTOR ) 40 MG tablet Take 1 tablet (40 mg total) by mouth daily. Please call [phone] to schedule a November appointment for future refills. Thank you.   Current Meds  Non- CV Medications Sig   acetaminophen  (TYLENOL ) 500 MG tablet Take 2 tablets (1,000 mg total) by mouth every 6 (six) hours.   acyclovir  (ZOVIRAX ) 400 MG tablet Take 400 mg by mouth 2 (two) times daily.   celecoxib  (CELEBREX ) 200 MG capsule Take 1 capsule (200 mg total) by mouth 2 (two) times daily.   cetirizine (ZYRTEC) 10 MG tablet Take 10 mg by mouth daily.   citalopram  (CELEXA ) 20 MG tablet Take 20 mg by mouth daily.   COSOPT 2-0.5 % ophthalmic solution Place 1 drop into the right eye 2 (two) times daily.  levothyroxine  (SYNTHROID ) 150 MCG tablet TAKE 1 TABLET(150 MCG) BY MOUTH DAILY   Misc Natural Products (JOINT HEALTH PO) Take 1 tablet by mouth daily.   Multiple Vitamins-Minerals (PRESERVISION AREDS 2 PO) Take 1 tablet by mouth 2 (two) times daily.   nitroGLYCERIN  (NITROSTAT ) 0.4 MG SL tablet DISSOLVE 1 TABLET UNDER THE TONGUE EVERY 5 MINUTES AS NEEDED FOR CHEST PAIN.   oxyCODONE  (OXY IR/ROXICODONE ) 5 MG immediate release tablet Take 1 tablet (5 mg total) by mouth every 6 (six) hours as needed for severe pain.   pantoprazole  (PROTONIX ) 20 MG tablet TAKE 1 TABLET(20 MG) BY MOUTH TWICE DAILY   potassium chloride  (KLOR-CON ) 20 MEQ packet Take 20 mEq by mouth daily.   zolpidem  (AMBIEN ) 10 MG tablet Take 10 mg by mouth at bedtime.    Studies Reviewed: SABRA   EKG Interpretation Date/Time:  Tuesday October 17 2023 09:12:31 EDT Ventricular Rate:  58 PR Interval:  174 QRS Duration:  84 QT Interval:  456 QTC Calculation: 447 R Axis:   26  Text Interpretation: Sinus bradycardia When compared with ECG of 30-Aug-2023 18:57, Sinus bradycardia has replaced Atrial fibrillation with  rapid ventricular response Confirmed by Anner Lenis ([id number]) on 10/17/2023 9:26:52 AM    Lab Results  Component Value Date   NA 136 08/30/2023   K 3.6 08/30/2023   CREATININE 1.42 (H) 08/30/2023   GFRNONAA 50 (L) 08/30/2023   GLUCOSE 136 (H) 08/30/2023   Lab Results  Component Value Date   WBC 9.2 08/30/2023   HGB 19.0 (H) 08/30/2023   HCT 54.2 (H) 08/30/2023   MCV 96.3 08/30/2023   PLT 222 08/30/2023   Lab Results  Component Value Date   CHOL 100 04/19/2022   HDL 38 (L) 04/19/2022   LDLCALC 37 04/19/2022   TRIG 148 04/19/2022   CHOLHDL 2.6 04/19/2022   Lab Results  Component Value Date   TSH 2.385 08/30/2023   Results LABS (12/08/2022):  Total cholesterol: 132; HDL: 42; LDL: 33; Triglycerides: 403 high (12/08/2022) (05/2023): A1c: 5.4 ;  Cr: 1.42 (05/2023); :  (11/2022 Cr 1.32)  RADIOLOGY Coronary CT Angiogram: Multiple arteries with blockage -ostial to mid LAD > 75%, < 50% distal; proximal-mid D1> 75% calcific plaque, normal D2; proximal LCx<50%, OM1 50%, OM2 <50-75% mixed plaque in AV groove; proximal RCA calcified 50%, 50-75% mid RCA calcified.  (11/2017)   DIAGNOSTIC Cardiac Cath 11/15/2017: Dominance: Right.  Proximal to mid RCA 60%, mid RCA 50%; distal LM-ostial LAD 85% with proximal to mid LAD 75%; proximal mid LCx 100%, ostial OM1 50%, OM2 80% and 90%;  Myoview  (01/24/2019): Normal myocardial perfusion imaging study without evidence of ischemia or infarction.  EF estimated 60%.  Abnormal septal motion consistent with postoperative state.  LOW RISK ECHO (08/02/2021): Normal LV size and function with EF of 65 to 70%.  No RWMA.  Indeterminate diastolic parameters-mildly dilated LA..  Mildly elevated PAP with moderate dilated RA and moderately elevated RAP estimated 15 mmHg..  Mild to moderate TR.  Mild AI with AoV sclerosis but no stenosis. Atrial flutter ablation 01/11/2019    Risk Assessment/Calculations:    CHA2DS2-VASc Score = 8   This indicates a 10.8%  annual risk of stroke. The patient's score is based upon: CHF History: 1 HTN History: 1 Diabetes History: 1 Stroke History: 2 Vascular Disease History: 1 Age Score: 2 Gender Score: 0            Physical Exam:   VS:  BP 138/82   Pulse ROLLEN)  56   Ht 5' 7 (1.702 m)   Wt 220 lb 3.2 oz (99.9 kg)   SpO2 95%   BMI 34.49 kg/m    Wt Readings from Last 3 Encounters:  10/17/23 220 lb 3.2 oz (99.9 kg)  12/23/22 232 lb (105.2 kg)  06/22/22 200 lb (90.7 kg)    Physical Exam PULMONARY: Lungs are clear to auscultation bilaterally. Nonlabored breathing. Good air movement.   GEN: Overall healthy appearing.  Well nourished, well groomed in no acute distress; moderately obese HEENT: No JVD; No carotid bruits; very hard of hearing CARDIAC: RRR; normal S1, S2; , 1/6 SEM.  Otherwise no murmurs, rubs, gallops RESPIRATORY:  Clear to auscultation without rales, wheezing or rhonchi ; nonlabored, good air movement. ABDOMEN: Soft, non-tender, non-distended EXTREMITIES:  No edema with mild venous stasis changes.; No deformity      ASSESSMENT AND PLAN: .    Problem List Items Addressed This Visit       Cardiology Problems   Chronic diastolic CHF (congestive heart failure) (HCC) (Chronic)   Chronic HFpEF, without current evidence of decompensation => no PND, orthopnea or edema.  Just exertional dyspnea. No current evidence of decompensation. Previous hospitalization for heart failure in 2022. - Reinitiate furosemide  milligram once daily to manage potential fluid retention - Will check LVEDP during cardiac catheterization.  If elevated will proceed with right heart cath - Check 2D echo      Coronary artery disease involving native coronary artery of native heart with angina pectoris (HCC) - Primary (Chronic)   Coronary artery disease status post coronary artery bypass grafting with progressive exertional dyspnea Progressive exertional dyspnea without chest pain, dizziness, or swelling.  Differential includes coronary artery disease progression or graft occlusion. Heart catheterization considered to assess graft patency and coronary artery status. - Proceed with Heart Catheterization to Assess Graft Patency and Coronary Artery Status. - Check kidney function prior to procedure to assess risk of contrast-induced nephropathy. - Ensure he is off anticoagulation two days prior to procedure. => Hold DOAC 2 days preop  Continue aspirin  81 mg daily Continue lisinopril  10 mg daily and Lopressor  25 mg twice daily for BP/heart rate Continue lipid management with combination of Zetia  10 mg daily, Vascepa  2 g twice daily and Crestor  40 daily.      Hyperlipidemia with target low density lipoprotein (LDL) cholesterol less than 55 mg/dL (Chronic)   Hyperlipidemia with elevated triglycerides, Well-controlled with most recent LDL 33 Managed with rosuvastatin  40 mg daily, Zetia  10 mg daily and Vascepa  2 mg twice daily. Last cholesterol check showed elevated triglycerides. - Order lipid panel to assess current cholesterol and triglyceride levels. -   Type 2 diabetes mellitus, well controlled Well controlled with an A1c of 5.4. Managed with Ozempic and metformin.      Relevant Orders   Basic metabolic panel with GFR   CBC   ECHOCARDIOGRAM COMPLETE   Paroxysmal atrial fibrillation (HCC) (Chronic)   The patient does not report symptoms associated with atrial fibrillation. AFib could be exacerbated by untreated sleep apnea. Currently managed with Eliquis  5 mg twice daily and rate control with Lopressor  25 mg twice daily => tolerating well.  No change.      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 Monitor kidney function, especially in light of upcoming procedures requiring contrast. - Monitor kidney function, especially prior to heart catheterization.      OSA (obstructive sleep apnea) (Chronic)   Significant sleep apnea previously treated with BiPAP, which is no longer used. May contribute to AFib and dyspnea. - Refer to sleep specialist for evaluation and management.  Following result catheterization, will need to have him scheduled to see a sleep medicine physician to discuss other options besides the facemask      S/P CABG x 4 (Chronic)   CABG in 2019 with most recent Myoview  in December 2020.  Would be due for ischemic evaluation this year anyway, but with now worsening exertional dyspnea symptoms that were similar to what led to the initial evaluation, recommendation would be to proceed with invasive valuation cardiac catheterization over Myoview .  Would not necessarily believe a negative Myoview .      Relevant Orders   EKG 12-Lead (Completed)   Basic metabolic panel with GFR   CBC   ECHOCARDIOGRAM COMPLETE   Other Visit Diagnoses       Progressive angina (HCC)       Relevant Orders   Basic metabolic panel with GFR   CBC   ECHOCARDIOGRAM COMPLETE       Assessment and Plan Assessment & Plan      Obstructive sleep apnea, untreated Significant sleep apnea previously treated with BiPAP, which is no longer used. May contribute to AFib and dyspnea. - Refer to sleep specialist for evaluation and management.  Chronic kidney disease, stage 2   Recording duration: 33 minutes       Informed Consent   Shared Decision Making/Informed Consent The risks [stroke (1 in 1000), death (1 in 1000), kidney failure [usually temporary] (1 in 500), bleeding (1 in 200), allergic reaction [possibly serious] (1 in 200)], benefits (diagnostic support  and management of coronary artery disease) and alternatives of a cardiac catheterization were discussed in detail with Mr. Boley and he is willing to proceed.      Follow-Up: Return in about 3 weeks (around 11/07/2023) for Post-cath Followup with me.  I spent 77 minutes in the care of OGLE HOEFFNER today including reviewing labs (1 minute), reviewing outside labs from scanned reports and [facility] (2 minute), reviewing studies (cardiac cath films reviewed along with Coronary CTA, Myoview  results and echocardiogram reviewed-15 minutes), face to face time discussing treatment options (31), reviewing records from Dr. Joesphine notes, hospitalizations, and ER visit (11 minutes), 17 minutes dictating, and documenting in the encounter.      Signed, Alm MICAEL Clay, MD, MS Alm Clay, M.D., M.S. Interventional Cardiologist  [facility] Pager # [phone]

## 2023-10-17 NOTE — Assessment & Plan Note (Addendum)
 Chronic HFpEF, without current evidence of decompensation => no PND, orthopnea or edema.  Just exertional dyspnea. No current evidence of decompensation. Previous hospitalization for heart failure in 2022. - Reinitiate furosemide  milligram once daily to manage potential fluid retention - Will check LVEDP during cardiac catheterization.  If elevated will proceed with right heart cath - Check 2D echo

## 2023-10-17 NOTE — Progress Notes (Signed)
 Cardiology Office Note:  .   Date:  10/17/2023  ID:  Carl [name], DOB 1945-07-03, MRN [id number] PCP: Sheldon Netter, PA  [facility] Providers Cardiologist:  Carl Clay, MD Cardiology APP:  Madie Jon Garre, [place]     Chief Complaint  Patient presents with   Follow-up    Delayed 63-month follow-up. ->  Establish new cardiologist (former Dr. Burnard patient)   Coronary Artery Disease    Exertional dyspnea worse over the last couple months.  No chest pain   Cardiomyopathy    No PND orthopnea or edema   Atrial Fibrillation    Had a ER visit for A-fib RVR but did not stay.  Otherwise has not symptoms.    Patient Profile: Carl     [name] is a  78 y.o. male  with a PMH reviewed below who presents here for delayed 84-month follow-up and to establish care with new cardiologist after Dr.'s retirement.   He returned at the request of Sheldon Netter, [place].  CAD-CABG x 4 (October 2019-Dr. Army): LIMA-LAD, SVG-OM1-dCx, SVG-rPDA Postop A-fib PAF -> on Eliquis  5 mg twice daily Did not tolerate 50 mg twice daily metoprolol  tartrate No longer on amiodarone  Atrial flutter status post flutter ablation December 2020 HFpEF-hospitalized in September 2022 On furosemide  40 mg a.m. and 20 mg p.m. On lisinopril  10 mg daily for afterload reduction History of TIA HLD: Rosuvastatin  40 mg daily and Vascepa  2 g twice daily plus Zetia  10 mg daily DM-2-metformin and (had been on Ozempic) Severe OSA (December 2020) => transition to BiPAP Hypothyroidism-on Synthroid      Carl [name] was last seen on December 23, 2022 as a follow-up visit with Dr. Burnard.  Noted that he is unaware of any A-fib or flutter.  Apparently had been given a prescription for Ozempic but not yet started.  This was likely because of a 30 pound weight gain over 6 months.  Was still exercising 5 days a week at the gym.  Sleeping better with BiPAP.  He went to the emergency room on 08/30/2023 with concerns regarding A-fib but  left after not being seen by the MD.  He is EKG showed A-fib with a rate of 117 bpm with aberrantly conducted beats versus PVCs  Subjective  Discussed the use of AI scribe software for clinical note transcription with the patient, who gave verbal consent to proceed.  History of Present Illness Carl [name] is a 78 year old male with atrial fibrillation and coronary artery disease who presents with progressive shortness of breath.  He has been experiencing progressive shortness of breath since the summer, initially attributing it to hot weather. He becomes completely out of breath after walking short distances, such as from his car to the house or to the elevator. He denies chest pain but reports very mild, occasional indigestion-like symptoms when walking. He denies swelling in his legs. He does not report dizziness, though sometimes he needs to lean against a wall. He also denies waking up short of breath or experiencing shortness of breath when lying flat.  He has a history of atrial fibrillation, first noted after coronary artery bypass surgery in October 2019. He is currently taking Eliquis  5 mg twice a day and metoprolol  25 mg twice a day. His watch sometimes alerts him to episodes of atrial fibrillation, but he has not felt any recent episodes.  He underwent coronary artery bypass surgery in October 2019 after a coronary CT scan and heart catheterization  revealed multiple arterial blockages. He has not experienced significant chest pain or angina, but reports mild indigestion-like symptoms occasionally when walking.  He has a history of sleep apnea but has not used his BiPAP machine for over a week due to discomfort with the mask. He reports sleeping well at night, aided by Ambien  and Tylenol  PM, and feels rested upon waking.  He is on several medications for his heart and blood pressure, including lisinopril  10 mg daily and Zetia  10 mg. He also takes Ozempic and metformin for diabetes  management, having lost 12 pounds since starting Ozempic. His last A1c was 5.4, indicating good diabetes control.  He has not taken his furosemide  for two to three months due to urinary urgency issues. He has not noticed significant weight changes recently.  He recalls being hospitalized for heart failure in September 2022, where he was treated with diuretics. He does not remember the specific symptoms from that time but notes that his current shortness of breath is a new experience.   Cardiovascular ROS: positive for - dyspnea on exertion, irregular heartbeat, shortness of breath, and more prominent exertional dyspnea with exercise intolerance and fatigue. negative for - chest pain, edema, loss of consciousness, orthopnea, palpitations, paroxysmal nocturnal dyspnea, rapid heart rate, or lightheadedness, dizziness or wooziness, syncope or near syncope or TIA/CVA or amaurosis fugax  ROS:  Review of Systems - Negative except symptoms noted above.  No melena, hematochezia, hematuria or epistaxis.    Objective   Current Meds  CV medications Sig   apixaban  (ELIQUIS ) 5 MG TABS tablet Take 1 tablet (5 mg total) by mouth 2 (two) times daily.   aspirin  EC 81 MG tablet Take 1 tablet (81 mg total) by mouth daily.   ezetimibe  (ZETIA ) 10 MG tablet Take 1 tablet (10 mg total) by mouth daily.   FARXIGA 10 MG TABS tablet Take 10 mg by mouth daily.   ferrous sulfate  325 (65 FE) MG tablet Take 325 mg by mouth daily.   furosemide  (LASIX ) 40 MG tablet TAKE 1 TABLET BY MOUTH EVERY MORNING AND 1/2 TABLET EVERY EVENING   icosapent  Ethyl (VASCEPA ) 1 g capsule TAKE 2 CAPSULES(2 GRAMS) BY MOUTH TWICE DAILY   lisinopril  (ZESTRIL ) 10 MG tablet Take 1 tablet (10 mg total) by mouth daily.   metFORMIN (GLUCOPHAGE-XR) 500 MG 24 hr tablet Take 500 mg by mouth in the morning and at bedtime.   metoprolol  tartrate (LOPRESSOR ) 25 MG tablet Take 1 tablet (25 mg total) by mouth 2 (two) times daily.   OZEMPIC, 1 MG/DOSE, 4  MG/3ML SOPN Inject 1 mg into the skin every Tuesday.   potassium chloride  (KLOR-CON ) 20 MEQ packet Take 20 mEq by mouth daily.   rosuvastatin  (CRESTOR ) 40 MG tablet Take 1 tablet (40 mg total) by mouth daily. Please call [phone] to schedule a November appointment for future refills. Thank you.   Current Meds  Non- CV Medications Sig   acetaminophen  (TYLENOL ) 500 MG tablet Take 2 tablets (1,000 mg total) by mouth every 6 (six) hours.   acyclovir  (ZOVIRAX ) 400 MG tablet Take 400 mg by mouth 2 (two) times daily.   celecoxib  (CELEBREX ) 200 MG capsule Take 1 capsule (200 mg total) by mouth 2 (two) times daily.   cetirizine (ZYRTEC) 10 MG tablet Take 10 mg by mouth daily.   citalopram  (CELEXA ) 20 MG tablet Take 20 mg by mouth daily.   COSOPT 2-0.5 % ophthalmic solution Place 1 drop into the right eye 2 (two) times daily.  levothyroxine  (SYNTHROID ) 150 MCG tablet TAKE 1 TABLET(150 MCG) BY MOUTH DAILY   Misc Natural Products (JOINT HEALTH PO) Take 1 tablet by mouth daily.   Multiple Vitamins-Minerals (PRESERVISION AREDS 2 PO) Take 1 tablet by mouth 2 (two) times daily.   nitroGLYCERIN  (NITROSTAT ) 0.4 MG SL tablet DISSOLVE 1 TABLET UNDER THE TONGUE EVERY 5 MINUTES AS NEEDED FOR CHEST PAIN.   oxyCODONE  (OXY IR/ROXICODONE ) 5 MG immediate release tablet Take 1 tablet (5 mg total) by mouth every 6 (six) hours as needed for severe pain.   pantoprazole  (PROTONIX ) 20 MG tablet TAKE 1 TABLET(20 MG) BY MOUTH TWICE DAILY   potassium chloride  (KLOR-CON ) 20 MEQ packet Take 20 mEq by mouth daily.   zolpidem  (AMBIEN ) 10 MG tablet Take 10 mg by mouth at bedtime.    Studies Reviewed: Carl   EKG Interpretation Date/Time:  Tuesday October 17 2023 09:12:31 EDT Ventricular Rate:  58 PR Interval:  174 QRS Duration:  84 QT Interval:  456 QTC Calculation: 447 R Axis:   26  Text Interpretation: Sinus bradycardia When compared with ECG of 30-Aug-2023 18:57, Sinus bradycardia has replaced Atrial fibrillation with  rapid ventricular response Confirmed by Anner Lenis ([id number]) on 10/17/2023 9:26:52 AM    Lab Results  Component Value Date   NA 136 08/30/2023   K 3.6 08/30/2023   CREATININE 1.42 (H) 08/30/2023   GFRNONAA 50 (L) 08/30/2023   GLUCOSE 136 (H) 08/30/2023   Lab Results  Component Value Date   WBC 9.2 08/30/2023   HGB 19.0 (H) 08/30/2023   HCT 54.2 (H) 08/30/2023   MCV 96.3 08/30/2023   PLT 222 08/30/2023   Lab Results  Component Value Date   CHOL 100 04/19/2022   HDL 38 (L) 04/19/2022   LDLCALC 37 04/19/2022   TRIG 148 04/19/2022   CHOLHDL 2.6 04/19/2022   Lab Results  Component Value Date   TSH 2.385 08/30/2023   Results LABS (12/08/2022):  Total cholesterol: 132; HDL: 42; LDL: 33; Triglycerides: 403 high (12/08/2022) (05/2023): A1c: 5.4 ;  Cr: 1.42 (05/2023); :  (11/2022 Cr 1.32)  RADIOLOGY Coronary CT Angiogram: Multiple arteries with blockage -ostial to mid LAD > 75%, < 50% distal; proximal-mid D1> 75% calcific plaque, normal D2; proximal LCx<50%, OM1 50%, OM2 <50-75% mixed plaque in AV groove; proximal RCA calcified 50%, 50-75% mid RCA calcified.  (11/2017)   DIAGNOSTIC Cardiac Cath 11/15/2017: Dominance: Right.  Proximal to mid RCA 60%, mid RCA 50%; distal LM-ostial LAD 85% with proximal to mid LAD 75%; proximal mid LCx 100%, ostial OM1 50%, OM2 80% and 90%;  Myoview  (01/24/2019): Normal myocardial perfusion imaging study without evidence of ischemia or infarction.  EF estimated 60%.  Abnormal septal motion consistent with postoperative state.  LOW RISK ECHO (08/02/2021): Normal LV size and function with EF of 65 to 70%.  No RWMA.  Indeterminate diastolic parameters-mildly dilated LA..  Mildly elevated PAP with moderate dilated RA and moderately elevated RAP estimated 15 mmHg..  Mild to moderate TR.  Mild AI with AoV sclerosis but no stenosis. Atrial flutter ablation 01/11/2019    Risk Assessment/Calculations:    CHA2DS2-VASc Score = 8   This indicates a 10.8%  annual risk of stroke. The patient's score is based upon: CHF History: 1 HTN History: 1 Diabetes History: 1 Stroke History: 2 Vascular Disease History: 1 Age Score: 2 Gender Score: 0            Physical Exam:   VS:  BP 138/82   Pulse ROLLEN)  56   Ht 5' 7 (1.702 m)   Wt 220 lb 3.2 oz (99.9 kg)   SpO2 95%   BMI 34.49 kg/m    Wt Readings from Last 3 Encounters:  10/17/23 220 lb 3.2 oz (99.9 kg)  12/23/22 232 lb (105.2 kg)  06/22/22 200 lb (90.7 kg)    Physical Exam PULMONARY: Lungs are clear to auscultation bilaterally. Nonlabored breathing. Good air movement.   GEN: Overall healthy appearing.  Well nourished, well groomed in no acute distress; moderately obese HEENT: No JVD; No carotid bruits; very hard of hearing CARDIAC: RRR; normal S1, S2; , 1/6 SEM.  Otherwise no murmurs, rubs, gallops RESPIRATORY:  Clear to auscultation without rales, wheezing or rhonchi ; nonlabored, good air movement. ABDOMEN: Soft, non-tender, non-distended EXTREMITIES:  No edema with mild venous stasis changes.; No deformity      ASSESSMENT AND PLAN: .    Problem List Items Addressed This Visit       Cardiology Problems   Chronic diastolic CHF (congestive heart failure) (HCC) (Chronic)   Chronic HFpEF, without current evidence of decompensation => no PND, orthopnea or edema.  Just exertional dyspnea. No current evidence of decompensation. Previous hospitalization for heart failure in 2022. - Reinitiate furosemide  milligram once daily to manage potential fluid retention - Will check LVEDP during cardiac catheterization.  If elevated will proceed with right heart cath - Check 2D echo      Coronary artery disease involving native coronary artery of native heart with angina pectoris (HCC) - Primary (Chronic)   Coronary artery disease status post coronary artery bypass grafting with progressive exertional dyspnea Progressive exertional dyspnea without chest pain, dizziness, or swelling.  Differential includes coronary artery disease progression or graft occlusion. Heart catheterization considered to assess graft patency and coronary artery status. - Proceed with Heart Catheterization to Assess Graft Patency and Coronary Artery Status. - Check kidney function prior to procedure to assess risk of contrast-induced nephropathy. - Ensure he is off anticoagulation two days prior to procedure. => Hold DOAC 2 days preop  Continue aspirin  81 mg daily Continue lisinopril  10 mg daily and Lopressor  25 mg twice daily for BP/heart rate Continue lipid management with combination of Zetia  10 mg daily, Vascepa  2 g twice daily and Crestor  40 daily.      Hyperlipidemia with target low density lipoprotein (LDL) cholesterol less than 55 mg/dL (Chronic)   Hyperlipidemia with elevated triglycerides, Well-controlled with most recent LDL 33 Managed with rosuvastatin  40 mg daily, Zetia  10 mg daily and Vascepa  2 mg twice daily. Last cholesterol check showed elevated triglycerides. - Order lipid panel to assess current cholesterol and triglyceride levels. -   Type 2 diabetes mellitus, well controlled Well controlled with an A1c of 5.4. Managed with Ozempic and metformin.      Relevant Orders   Basic metabolic panel with GFR   CBC   ECHOCARDIOGRAM COMPLETE   Paroxysmal atrial fibrillation (HCC) (Chronic)   The patient does not report symptoms associated with atrial fibrillation. AFib could be exacerbated by untreated sleep apnea. Currently managed with Eliquis  5 mg twice daily and rate control with Lopressor  25 mg twice daily => tolerating well.  No change.      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 Monitor kidney function, especially in light of upcoming procedures requiring contrast. - Monitor kidney function, especially prior to heart catheterization.      OSA (obstructive sleep apnea) (Chronic)   Significant sleep apnea previously treated with BiPAP, which is no longer used. May contribute to AFib and dyspnea. - Refer to sleep specialist for evaluation and management.  Following result catheterization, will need to have him scheduled to see a sleep medicine physician to discuss other options besides the facemask      S/P CABG x 4 (Chronic)   CABG in 2019 with most recent Myoview  in December 2020.  Would be due for ischemic evaluation this year anyway, but with now worsening exertional dyspnea symptoms that were similar to what led to the initial evaluation, recommendation would be to proceed with invasive valuation cardiac catheterization over Myoview .  Would not necessarily believe a negative Myoview .      Relevant Orders   EKG 12-Lead (Completed)   Basic metabolic panel with GFR   CBC   ECHOCARDIOGRAM COMPLETE   Other Visit Diagnoses       Progressive angina (HCC)       Relevant Orders   Basic metabolic panel with GFR   CBC   ECHOCARDIOGRAM COMPLETE       Assessment and Plan Assessment & Plan      Obstructive sleep apnea, untreated Significant sleep apnea previously treated with BiPAP, which is no longer used. May contribute to AFib and dyspnea. - Refer to sleep specialist for evaluation and management.  Chronic kidney disease, stage 2   Recording duration: 33 minutes       Informed Consent   Shared Decision Making/Informed Consent The risks [stroke (1 in 1000), death (1 in 1000), kidney failure [usually temporary] (1 in 500), bleeding (1 in 200), allergic reaction [possibly serious] (1 in 200)], benefits (diagnostic support  and management of coronary artery disease) and alternatives of a cardiac catheterization were discussed in detail with Carl [name] and he is willing to proceed.      Follow-Up: Return in about 3 weeks (around 11/07/2023) for Post-cath Followup with me.  I spent 77 minutes in the care of Carl [name] today including reviewing labs (1 minute), reviewing outside labs from scanned reports and KPN (2 minute), reviewing studies (cardiac cath films reviewed along with Coronary CTA, Myoview  results and echocardiogram reviewed-15 minutes), face to face time discussing treatment options (31), reviewing records from Dr. Joesphine notes, hospitalizations, and ER visit (11 minutes), 17 minutes dictating, and documenting in the encounter.      Signed, Carl MICAEL Clay, MD, MS Carl [name], M.D., M.S. Interventional Cardiologist  [facility] Pager # [phone]

## 2023-10-17 NOTE — Assessment & Plan Note (Signed)
 Hyperlipidemia with elevated triglycerides, Well-controlled with most recent LDL 33 Managed with rosuvastatin  40 mg daily, Zetia  10 mg daily and Vascepa  2 mg twice daily. Last cholesterol check showed elevated triglycerides. - Order lipid panel to assess current cholesterol and triglyceride levels. -   Type 2 diabetes mellitus, well controlled Well controlled with an A1c of 5.4. Managed with Ozempic and metformin.

## 2023-10-17 NOTE — Assessment & Plan Note (Addendum)
 Significant sleep apnea previously treated with BiPAP, which is no longer used. May contribute to AFib and dyspnea. - Refer to sleep specialist for evaluation and management.  Following result catheterization, will need to have him scheduled to see a sleep medicine physician to discuss other options besides the facemask

## 2023-10-17 NOTE — Assessment & Plan Note (Signed)
 Chronic kidney disease with creatinine at 1.42. Monitor kidney function, especially in light of upcoming procedures requiring contrast. - Monitor kidney function, especially prior to heart catheterization.

## 2023-10-17 NOTE — Assessment & Plan Note (Signed)
 The patient does not report symptoms associated with atrial fibrillation. AFib could be exacerbated by untreated sleep apnea. Currently managed with Eliquis  5 mg twice daily and rate control with Lopressor  25 mg twice daily => tolerating well.  No change.

## 2023-10-17 NOTE — Assessment & Plan Note (Signed)
 Coronary artery disease status post coronary artery bypass grafting with progressive exertional dyspnea Progressive exertional dyspnea without chest pain, dizziness, or swelling. Differential includes coronary artery disease progression or graft occlusion. Heart catheterization considered to assess graft patency and coronary artery status. - Proceed with Heart Catheterization to Assess Graft Patency and Coronary Artery Status. - Check kidney function prior to procedure to assess risk of contrast-induced nephropathy. - Ensure he is off anticoagulation two days prior to procedure. => Hold DOAC 2 days preop  Continue aspirin  81 mg daily Continue lisinopril  10 mg daily and Lopressor  25 mg twice daily for BP/heart rate Continue lipid management with combination of Zetia  10 mg daily, Vascepa  2 g twice daily and Crestor  40 daily.

## 2023-10-17 NOTE — Assessment & Plan Note (Signed)
 Status post a flutter ablation

## 2023-10-17 NOTE — Assessment & Plan Note (Signed)
 Borderline blood pressure today on combination of lisinopril  10 mg daily and Lopressor  25 mg twice daily Reassess blood pressure during cardiac catheterization, low threshold to increase ACE inhibitor

## 2023-10-18 LAB — BASIC METABOLIC PANEL WITH GFR
BUN/Creatinine Ratio: 13 (ref 10–24)
BUN: 12 mg/dL (ref 8–27)
CO2: 22 mmol/L (ref 20–29)
Calcium: 9.3 mg/dL (ref 8.6–10.2)
Chloride: 100 mmol/L (ref 96–106)
Creatinine, Ser: 0.89 mg/dL (ref 0.76–1.27)
Glucose: 93 mg/dL (ref 70–99)
Potassium: 4.5 mmol/L (ref 3.5–5.2)
Sodium: 137 mmol/L (ref 134–144)
eGFR: 87 mL/min/1.73 (ref >59)

## 2023-10-18 LAB — CBC
Hematocrit: 45.9 % (ref 37.5–51.0)
Hemoglobin: 15.9 g/dL (ref 13.0–17.7)
MCH: 34.3 pg — ABNORMAL HIGH (ref 26.6–33.0)
MCHC: 34.6 g/dL (ref 31.5–35.7)
MCV: 99 fL — ABNORMAL HIGH (ref 79–97)
Platelets: 216 x10E3/uL (ref 150–450)
RBC: 4.64 x10E6/uL (ref 4.14–5.80)
RDW: 12.8 % (ref 11.6–15.4)
WBC: 6.9 x10E3/uL (ref 3.4–10.8)

## 2023-10-20 ENCOUNTER — Other Ambulatory Visit (HOSPITAL_BASED_OUTPATIENT_CLINIC_OR_DEPARTMENT_OTHER): Payer: Self-pay | Admitting: *Deleted

## 2023-10-20 DIAGNOSIS — I200 Unstable angina: Secondary | ICD-10-CM

## 2023-10-21 ENCOUNTER — Ambulatory Visit: Payer: Self-pay | Admitting: Cardiology

## 2023-10-24 ENCOUNTER — Telehealth: Payer: Self-pay | Admitting: *Deleted

## 2023-10-24 NOTE — Telephone Encounter (Signed)
Should be fine.

## 2023-10-24 NOTE — Telephone Encounter (Signed)
Patient is returning call to review procedure instructions.

## 2023-10-24 NOTE — Telephone Encounter (Addendum)
 Cardiac Catheterization scheduled at [facility] for: Wednesday October 25, 2023 8:30 AM Arrival time [facility] Main Entrance A at: 6:30 AM  Diet: -Nothing to eat after midnight.  Hydration: -May drink clear liquids until 2 hours before the procedure.  Approved liquids: Water , clear tea, black coffee, fruit juices-non-citric and without pulp,Gatorade, plain Jello/popsicles.   -Please drink 16 oz of water  2 hours before procedure.   Medication instructions: -Hold:  Eliquis -none 10/23/23 until post procedure  Metformin-day of procedure and 48 hours post procedure  Farxiga/Lasix /KCl-AM of procedure  Ozempic-weekly-AM of procedure  -Other usual morning medications can be taken including aspirin  81 mg.  Plan to go home the same day, you will only stay overnight if medically necessary.  You must have responsible adult to drive you home.  Someone must be with you the first 24 hours after you arrive home.  Left message for patient to call back to review procedure instructions

## 2023-10-24 NOTE — Telephone Encounter (Signed)
 Reviewed procedure instructions with patient.

## 2023-10-24 NOTE — Telephone Encounter (Addendum)
 Patient tells me he forgot and took Eliquis  last night (10/23/23) about 9 PM. Patient tells me he has not taken Eliquis  today and knows not to take any more Eliquis  until after the procedure scheduled for tomorrow (10/25/23) at 8:30 AM.  Per Dr Arturo to proceed with cath 10/25/23.

## 2023-10-25 ENCOUNTER — Encounter (HOSPITAL_COMMUNITY)
Admission: RE | Disposition: A | Discharge status: Home / Self Care | Payer: Self-pay | Source: Home / Self Care | Attending: Cardiology

## 2023-10-25 ENCOUNTER — Other Ambulatory Visit (HOSPITAL_COMMUNITY): Payer: Self-pay

## 2023-10-25 ENCOUNTER — Ambulatory Visit (HOSPITAL_COMMUNITY)
Admission: RE | Admit: 2023-10-25 | Discharge: 2023-10-25 | Disposition: A | Discharge status: Home / Self Care | Attending: Cardiology | Admitting: Cardiology

## 2023-10-25 ENCOUNTER — Other Ambulatory Visit: Payer: Self-pay

## 2023-10-25 DIAGNOSIS — I272 Pulmonary hypertension, unspecified: Secondary | ICD-10-CM | POA: Diagnosis not present

## 2023-10-25 DIAGNOSIS — Z7985 Long-term (current) use of injectable non-insulin antidiabetic drugs: Secondary | ICD-10-CM | POA: Diagnosis not present

## 2023-10-25 DIAGNOSIS — Z7982 Long term (current) use of aspirin: Secondary | ICD-10-CM | POA: Diagnosis not present

## 2023-10-25 DIAGNOSIS — Z959 Presence of cardiac and vascular implant and graft, unspecified: Secondary | ICD-10-CM

## 2023-10-25 DIAGNOSIS — I209 Angina pectoris, unspecified: Secondary | ICD-10-CM | POA: Diagnosis present

## 2023-10-25 DIAGNOSIS — Z7984 Long term (current) use of oral hypoglycemic drugs: Secondary | ICD-10-CM | POA: Insufficient documentation

## 2023-10-25 DIAGNOSIS — I429 Cardiomyopathy, unspecified: Secondary | ICD-10-CM | POA: Insufficient documentation

## 2023-10-25 DIAGNOSIS — N1831 Chronic kidney disease, stage 3a: Secondary | ICD-10-CM | POA: Insufficient documentation

## 2023-10-25 DIAGNOSIS — I25119 Atherosclerotic heart disease of native coronary artery with unspecified angina pectoris: Secondary | ICD-10-CM | POA: Diagnosis present

## 2023-10-25 DIAGNOSIS — I2510 Atherosclerotic heart disease of native coronary artery without angina pectoris: Secondary | ICD-10-CM | POA: Diagnosis not present

## 2023-10-25 DIAGNOSIS — Z8673 Personal history of transient ischemic attack (TIA), and cerebral infarction without residual deficits: Secondary | ICD-10-CM | POA: Diagnosis not present

## 2023-10-25 DIAGNOSIS — Z951 Presence of aortocoronary bypass graft: Secondary | ICD-10-CM | POA: Diagnosis not present

## 2023-10-25 DIAGNOSIS — I130 Hypertensive heart and chronic kidney disease with heart failure and stage 1 through stage 4 chronic kidney disease, or unspecified chronic kidney disease: Secondary | ICD-10-CM | POA: Diagnosis not present

## 2023-10-25 DIAGNOSIS — I480 Paroxysmal atrial fibrillation: Secondary | ICD-10-CM | POA: Insufficient documentation

## 2023-10-25 DIAGNOSIS — E1122 Type 2 diabetes mellitus with diabetic chronic kidney disease: Secondary | ICD-10-CM | POA: Insufficient documentation

## 2023-10-25 DIAGNOSIS — E781 Pure hyperglyceridemia: Secondary | ICD-10-CM | POA: Diagnosis not present

## 2023-10-25 DIAGNOSIS — G4733 Obstructive sleep apnea (adult) (pediatric): Secondary | ICD-10-CM | POA: Diagnosis not present

## 2023-10-25 DIAGNOSIS — I447 Left bundle-branch block, unspecified: Secondary | ICD-10-CM | POA: Diagnosis not present

## 2023-10-25 DIAGNOSIS — Z79899 Other long term (current) drug therapy: Secondary | ICD-10-CM | POA: Diagnosis not present

## 2023-10-25 DIAGNOSIS — R0609 Other forms of dyspnea: Secondary | ICD-10-CM | POA: Diagnosis not present

## 2023-10-25 DIAGNOSIS — Z7901 Long term (current) use of anticoagulants: Secondary | ICD-10-CM | POA: Insufficient documentation

## 2023-10-25 DIAGNOSIS — I200 Unstable angina: Secondary | ICD-10-CM

## 2023-10-25 DIAGNOSIS — I5032 Chronic diastolic (congestive) heart failure: Secondary | ICD-10-CM | POA: Diagnosis not present

## 2023-10-25 DIAGNOSIS — I4891 Unspecified atrial fibrillation: Secondary | ICD-10-CM

## 2023-10-25 HISTORY — PX: RIGHT/LEFT HEART CATH AND CORONARY/GRAFT ANGIOGRAPHY: CATH118267

## 2023-10-25 HISTORY — PX: CORONARY STENT INTERVENTION: CATH118234

## 2023-10-25 HISTORY — PX: CORONARY IMAGING/OCT: CATH118326

## 2023-10-25 LAB — GLUCOSE, CAPILLARY
Glucose-Capillary: 86 mg/dL (ref 70–99)
Glucose-Capillary: 89 mg/dL (ref 70–99)

## 2023-10-25 LAB — POCT I-STAT 7, (LYTES, BLD GAS, ICA,H+H)
Acid-base deficit: 5 mmol/L — ABNORMAL HIGH (ref 0.0–2.0)
Bicarbonate: 21.6 mmol/L (ref 20.0–28.0)
Calcium, Ion: 1.19 mmol/L (ref 1.15–1.40)
HCT: 45 % (ref 39.0–52.0)
Hemoglobin: 15.3 g/dL (ref 13.0–17.0)
O2 Saturation: 93 %
Potassium: 4 mmol/L (ref 3.5–5.1)
Sodium: 139 mmol/L (ref 135–145)
TCO2: 23 mmol/L (ref 22–32)
pCO2 arterial: 43.2 mmHg (ref 32–48)
pH, Arterial: 7.307 — ABNORMAL LOW (ref 7.35–7.45)
pO2, Arterial: 74 mmHg — ABNORMAL LOW (ref 83–108)

## 2023-10-25 LAB — POCT I-STAT EG7
Acid-base deficit: 3 mmol/L — ABNORMAL HIGH (ref 0.0–2.0)
Acid-base deficit: 4 mmol/L — ABNORMAL HIGH (ref 0.0–2.0)
Bicarbonate: 23.7 mmol/L (ref 20.0–28.0)
Bicarbonate: 22.6 mmol/L (ref 20.0–28.0)
Calcium, Ion: 1.18 mmol/L (ref 1.15–1.40)
Calcium, Ion: 1.17 mmol/L (ref 1.15–1.40)
HCT: 46 % (ref 39.0–52.0)
HCT: 45 % (ref 39.0–52.0)
Hemoglobin: 15.6 g/dL (ref 13.0–17.0)
Hemoglobin: 15.3 g/dL (ref 13.0–17.0)
O2 Saturation: 73 %
O2 Saturation: 72 %
Potassium: 4.1 mmol/L (ref 3.5–5.1)
Potassium: 4 mmol/L (ref 3.5–5.1)
Sodium: 139 mmol/L (ref 135–145)
Sodium: 138 mmol/L (ref 135–145)
TCO2: 25 mmol/L (ref 22–32)
TCO2: 24 mmol/L (ref 22–32)
pCO2, Ven: 47.5 mmHg (ref 44–60)
pCO2, Ven: 47.4 mmHg (ref 44–60)
pH, Ven: 7.305 (ref 7.25–7.43)
pH, Ven: 7.286 (ref 7.25–7.43)
pO2, Ven: 43 mmHg (ref 32–45)
pO2, Ven: 43 mmHg (ref 32–45)

## 2023-10-25 LAB — POCT ACTIVATED CLOTTING TIME
Activated Clotting Time: 325 s
Activated Clotting Time: 291 s

## 2023-10-25 SURGERY — RIGHT/LEFT HEART CATH AND CORONARY/GRAFT ANGIOGRAPHY
Anesthesia: LOCAL

## 2023-10-25 MED ORDER — FENTANYL CITRATE (PF) 100 MCG/2ML IJ SOLN
INTRAMUSCULAR | Status: AC
  Filled 2023-10-25: qty 2

## 2023-10-25 MED ORDER — ONDANSETRON HCL 4 MG/2ML IJ SOLN: 4.0000 mg | Freq: Four times a day (QID) | INTRAMUSCULAR | Status: DC | PRN

## 2023-10-25 MED ORDER — MIDAZOLAM HCL 2 MG/2ML IJ SOLN
INTRAMUSCULAR | Status: DC | PRN
  Administered 2023-10-25: 1 mg via INTRAVENOUS

## 2023-10-25 MED ORDER — HEPARIN SODIUM (PORCINE) 1000 UNIT/ML IJ SOLN
INTRAMUSCULAR | Status: AC
  Filled 2023-10-25: qty 10

## 2023-10-25 MED ORDER — SODIUM CHLORIDE 0.9 % IV SOLN: 250.0000 mL | INTRAVENOUS | Status: DC | PRN

## 2023-10-25 MED ORDER — ACETAMINOPHEN 325 MG PO TABS
650.0000 mg | ORAL_TABLET | ORAL | Status: DC | PRN
  Administered 2023-10-25: 650 mg via ORAL
  Filled 2023-10-25: qty 2

## 2023-10-25 MED ORDER — ASPIRIN 81 MG PO CHEW
81.0000 mg | CHEWABLE_TABLET | Freq: Once | ORAL | Status: AC
  Administered 2023-10-25: 81 mg via ORAL

## 2023-10-25 MED ORDER — MIDAZOLAM HCL 2 MG/2ML IJ SOLN
INTRAMUSCULAR | Status: AC
  Filled 2023-10-25: qty 2

## 2023-10-25 MED ORDER — NITROGLYCERIN 1 MG/10 ML FOR IR/CATH LAB
INTRA_ARTERIAL | Status: AC
  Filled 2023-10-25: qty 10

## 2023-10-25 MED ORDER — LIDOCAINE HCL (PF) 1 % IJ SOLN
INTRAMUSCULAR | Status: DC | PRN
  Administered 2023-10-25 (×2): 2 mL

## 2023-10-25 MED ORDER — SODIUM CHLORIDE 0.9 % IV SOLN
INTRAVENOUS | Status: DC | PRN
  Administered 2023-10-25: 10 mL/h via INTRAVENOUS

## 2023-10-25 MED ORDER — SODIUM CHLORIDE 0.9% FLUSH: 3.0000 mL | INTRAVENOUS | Status: DC | PRN

## 2023-10-25 MED ORDER — HEPARIN SODIUM (PORCINE) 1000 UNIT/ML IJ SOLN
INTRAMUSCULAR | Status: DC | PRN
  Administered 2023-10-25: 6000 [IU] via INTRAVENOUS
  Administered 2023-10-25: 5000 [IU] via INTRAVENOUS

## 2023-10-25 MED ORDER — ASPIRIN 81 MG PO CHEW
CHEWABLE_TABLET | ORAL | Status: AC
  Filled 2023-10-25: qty 1

## 2023-10-25 MED ORDER — VERAPAMIL HCL 2.5 MG/ML IV SOLN
INTRAVENOUS | Status: DC | PRN
  Administered 2023-10-25: 10 mL via INTRA_ARTERIAL

## 2023-10-25 MED ORDER — SODIUM CHLORIDE 0.9% FLUSH: 3.0000 mL | Freq: Two times a day (BID) | INTRAVENOUS | Status: DC

## 2023-10-25 MED ORDER — ASPIRIN EC 81 MG PO TBEC
81.0000 mg | DELAYED_RELEASE_TABLET | Freq: Every day | ORAL | Status: DC
Start: 2023-10-25 — End: 2023-11-08

## 2023-10-25 MED ORDER — CLOPIDOGREL BISULFATE 75 MG PO TABS
75.0000 mg | ORAL_TABLET | Freq: Every day | ORAL | 3 refills | Status: DC
  Filled 2023-10-25: qty 90, 90d supply, fill #0

## 2023-10-25 MED ORDER — FREE WATER: 500.0000 mL | Freq: Once | Status: DC

## 2023-10-25 MED ORDER — HEPARIN (PORCINE) IN NACL 1000-0.9 UT/500ML-% IV SOLN
INTRAVENOUS | Status: DC | PRN
  Administered 2023-10-25: 500 mL
  Administered 2023-10-25: 1000 mL

## 2023-10-25 MED ORDER — ASPIRIN EC 81 MG PO TBEC
81.0000 mg | DELAYED_RELEASE_TABLET | Freq: Every day | ORAL | Status: DC
Start: 2023-10-25 — End: 2023-10-25

## 2023-10-25 MED ORDER — CLOPIDOGREL BISULFATE 300 MG PO TABS
ORAL_TABLET | ORAL | Status: DC | PRN
  Administered 2023-10-25: 600 mg via ORAL

## 2023-10-25 MED ORDER — LIDOCAINE HCL (PF) 1 % IJ SOLN
INTRAMUSCULAR | Status: AC
Start: 2023-10-25 — End: 2023-10-25
  Filled 2023-10-25: qty 30

## 2023-10-25 MED ORDER — VERAPAMIL HCL 2.5 MG/ML IV SOLN
INTRAVENOUS | Status: AC
  Filled 2023-10-25: qty 2

## 2023-10-25 MED ORDER — CLOPIDOGREL BISULFATE 75 MG PO TABS: 75.0000 mg | ORAL_TABLET | Freq: Every day | ORAL | Status: DC

## 2023-10-25 MED ORDER — HYDRALAZINE HCL 20 MG/ML IJ SOLN: 10.0000 mg | INTRAMUSCULAR | Status: DC | PRN

## 2023-10-25 MED ORDER — LABETALOL HCL 5 MG/ML IV SOLN
10.0000 mg | INTRAVENOUS | Status: DC | PRN
  Administered 2023-10-25: 10 mg via INTRAVENOUS
  Filled 2023-10-25: qty 4

## 2023-10-25 MED ORDER — FENTANYL CITRATE (PF) 100 MCG/2ML IJ SOLN
INTRAMUSCULAR | Status: DC | PRN
  Administered 2023-10-25: 25 ug via INTRAVENOUS

## 2023-10-25 MED ORDER — NITROGLYCERIN 1 MG/10 ML FOR IR/CATH LAB
INTRA_ARTERIAL | Status: DC | PRN
  Administered 2023-10-25 (×2): 200 ug via INTRACORONARY

## 2023-10-25 SURGICAL SUPPLY — 21 items
BALLOON EMERGE MR 3.0X15 (BALLOONS) IMPLANT
BALLOON SAPPHIRE NC24 4.0X18 (BALLOONS) IMPLANT
BALLOON ~~LOC~~ EMERGE MR 3.5X6 (BALLOONS) IMPLANT
CATH BALLN WEDGE 5F 110CM (CATHETERS) IMPLANT
CATH DRAGONFLY OPSTAR (CATHETERS) IMPLANT
CATH INFINITI 5FR AL1 (CATHETERS) IMPLANT
CATH INFINITI 5FR MULTPACK ANG (CATHETERS) IMPLANT
CATH VISTA GUIDE 6FR JR4 ECOPK (CATHETERS) IMPLANT
DEVICE RAD COMP TR BAND LRG (VASCULAR PRODUCTS) IMPLANT
ELECT DEFIB PAD ADLT CADENCE (PAD) IMPLANT
GLIDESHEATH SLEND SS 6F .021 (SHEATH) IMPLANT
GUIDEWIRE INQWIRE 1.5J.035X260 (WIRE) IMPLANT
KIT ENCORE 26 ADVANTAGE (KITS) IMPLANT
KIT SYRINGE INJ CVI SPIKEX1 (MISCELLANEOUS) IMPLANT
PACK CARDIAC CATHETERIZATION (CUSTOM PROCEDURE TRAY) ×2 IMPLANT
SET ATX-X65L (MISCELLANEOUS) IMPLANT
SHEATH GLIDE SLENDER 4/5FR (SHEATH) IMPLANT
SHEATH PROBE COVER 6X72 (BAG) IMPLANT
STENT SYNERGY XD 3.0X32 (Permanent Stent) IMPLANT
STENT SYNERGY XD 3.0X8 (Permanent Stent) IMPLANT
WIRE ASAHI PROWATER 180CM (WIRE) IMPLANT

## 2023-10-25 NOTE — Interval H&P Note (Signed)
 History and Physical Interval Note:  10/25/2023 8:42 AM  [name]  has presented today for surgery, with the diagnosis of angina.  The various methods of treatment have been discussed with the patient and family. After consideration of risks, benefits and other options for treatment, the patient has consented to  Procedure(s): RIGHT/LEFT HEART CATH AND CORONARY/GRAFT ANGIOGRAPHY (N/A)  PERCUTANEOUS CORONARY INTERVENTION   as a surgical intervention.  The patient's history has been reviewed, patient examined, no change in status, stable for surgery.  I have reviewed the patient's chart and labs.  Questions were answered to the patient's satisfaction.     Cath Lab Visit (complete for each Cath Lab visit)  Clinical Evaluation Leading to the Procedure:   ACS: No.  Non-ACS:    Anginal Classification: CCS III - Atypical Symptoms -- Mostly DOE  Anti-ischemic medical therapy: Maximal Therapy (2 or more classes of medications)  Non-Invasive Test Results: No non-invasive testing performed  Prior CABG: Previous CABG    Alm Clay

## 2023-10-25 NOTE — Progress Notes (Signed)
 CARDIAC REHAB PHASE I     Post stent education including site care, restrictions, risk factors, exercise guidelines, NTG use, antiplatelet therapy importance, heart healthy diabetic diet and CRP2 reviewed. All questions and concerns addressed. Will refer to [facility] for CRP2. Plan for home later today.    [id number] Fairy JONETTA Music, RN BSN 10/25/2023 3:55 PM

## 2023-10-25 NOTE — Discharge Summary (Signed)
 Discharge Summary for Same Day PCI   Patient ID: Carl [name] MRN: [id number]; DOB: 01-Mar-1945  Admit date: 10/25/2023 Discharge date: 10/25/2023  Primary Care Provider: Sheldon Netter, [place]  Primary Cardiologist: Alm Clay, MD  Primary Electrophysiologist:  None   Discharge Diagnoses    Active Problems:   S/P CABG x 4   Coronary artery disease involving native coronary artery of native heart with angina pectoris [facility])    Diagnostic Studies/Procedures    Cardiac Catheterization 10/25/2023:    CULPRIT LESION: Prox RCA to Mid RCA lesion is 70% stenosed.  Mid RCA-1 lesion is 80% stenosed. Mid RCA-2 lesion is 60% stenosed.  (SVG to RPDA is atretic)   A drug-eluting stent was successfully placed using a STENT SYNERGY XD 3.0X32.  The proximal 28 mm postdilated to 4.1 mm with the distal segment postdilated 3.5 mm.  Post intervention, there is a 0% residual stenosis.  TIMI-3 flow maintained   A 2ND stent was successfully placed overlapping the stent distally to ensure coverage of the final 60% stenosis, using a STENT SYNERGY XD 3.0X8.  This was postdilated at high atmospheres to 3.6 mm. Post intervention, there is a 0% residual stenosis.  TIMI-3 flow maintained   -------------------------------------------------------   Dist LM to Ost LAD lesion is 70% stenosed.  Prox LAD to Mid LAD lesion is 75% stenosed.   Ost 1st Mrg to 1st Mrg lesion is 75% stenosed.  Small caliber 2nd Mrg lesion is 80% stenosed. Ost 2nd Mrg lesion is 70% stenosed. Dist Cx lesion is 100% stenosed.   -------------------GRAFTS-----------------------------------   A stent was successfully placed.   Post intervention, there is a 0% residual stenosis.   Post intervention, there is a 0% residual stenosis.   LIMA-dLAD graft was visualized by angiography and is normal in caliber.  The graft exhibits no disease. There is competitive flow.   Seq SVG- SVG-OM1-OM3 graft was visualized by angiography and is large.  The graft  exhibits no disease.  The flow is not reversed.  There is no competitive flow   SVG-rPDA graft was visualized by angiography and is small.  The graft exhibits severe diffuse disease.  Origin lesion is 90% stenosed. Prox Graft to Insertion lesion is 60% stenosed.   ------------------------------------------------------   The left ventricular systolic function is normal.  The left ventricular ejection fraction is greater than 65% by visual estimate.  There is no aortic valve stenosis.   Hemodynamic findings consistent with mild pulmonary hypertension.  Mean PAP 25 mL of mercury with LVEDP of 24 mmHg and PCWP of 22 mm..         Severe three-vessel CAD: - Ostial LAD 70% stenosis with competitive flow from patent LIMA to LAD. - LCx has 3 OM branches with competitive flow in OM 1 and occlusion of the LCx after OM 2 with OM1 and OM 3 being filled via Seq SVG-OM1-OM3 that is widely patent. OM 2 was a relatively small caliber vessel with stable 70 and 80% stenoses and a roughly 2-2.25 mm vessel - not favorable for PCI. Large-caliber dominant RCA has progression of the mid stenosis to tandem 70% to 80% and 60% lesions (progressive disease). The SVG to RPDA is atretic with severe ostial 90% stenosis. ==> Successful DES PCI covering the 3 RCA lesions with a Synergy XD 32 mm and 3.0 x 8 mm distal overlapping stent postdilated and taper fashion from 4.1 down to 3.6 mm distally. 3/4 Patent Grafts: Patent LIMA to LAD & SeqSVG-OM1-OM3, and atretic  SVG-RPDA Normal hyperdynamic LVEF with no RWMA. Moderately elevated EDP of 24-26 mmHg. 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 Normal Fick Cardiac Output and Index: 6.48-3.08    RECOMMENDATIONS   In the absence of any other complications or medical issues, we expect the patient to be ready for discharge from an interventional  cardiology perspective on 10/25/2023.   Ensure that the patient is taking his Lasix  daily; With rate related left bundle branch block in the Cath Lab, will likely reduce beta-blocker dose in order to increase afterload reduction.  This can be done in the outpatient setting.   Recommend to resume Apixaban , at currently prescribed dose and frequency on 10/25/2023.   Recommend concurrent antiplatelet therapy of Aspirin  81 mg for 1 month and Clopidogrel  75mg  daily for 6 months .   Will actually stop aspirin  at 2 weeks, and continue Plavix  for 6 months along with Eliquis .3    Alm Clay, MD  Diagnostic Dominance: Right  Intervention   _____________   History of Present Illness     Carl [name] is a 78 y.o. male with past medical history of HFpEF, CAD status post 4v CABG, hyperlipidemia, paroxysmal atrial fibrillation, hypertension, typical atrial flutter, CKD stage III, OSA who was followed by Dr. Clay as an outpatient.  Recently seen in the office on 9/9 with progressive dyspnea on exertion.  Given symptoms he was set up for outpatient cardiac catheterization.  Hospital Course     The patient underwent cardiac cath as noted above with patent LIMA to LAD, sequential SVG to OM1-OM3 with atretic SVG to RPDA.  Successful PCI/DES x 1 covering 3 lesions in the RCA.  Plan for triple therapy with aspirin , Plavix  and Eliquis  x 2 weeks then stopping aspirin  with continuation of Plavix  and Eliquis  for at least 6 months. The patient was seen by cardiac rehab while in short stay. There were no observed complications post cath. Radial cath site was re-evaluated prior to discharge and found to be stable without any complications. Instructions/precautions regarding cath site care were given prior to discharge.  Carl [name] was seen by Dr. Clay and determined stable for discharge home. Follow up with our office has been arranged. Medications are listed below. Pertinent changes include addition of  plavix .  _____________  Cath/PCI Registry Performance & Quality Measures: Aspirin  prescribed? - Yes ADP Receptor Inhibitor (Plavix /Clopidogrel , Brilinta/Ticagrelor or Effient/Prasugrel) prescribed (includes medically managed patients)? - Yes High Intensity Statin (Lipitor 40-80mg  or Crestor  20-40mg ) prescribed? - Yes For EF <40%, was ACEI/ARB prescribed? - Not Applicable (EF >/= 40%) For EF <40%, Aldosterone Antagonist (Spironolactone or Eplerenone) prescribed? - Not Applicable (EF >/= 40%) Cardiac Rehab Phase II ordered (Included Medically managed Patients)? - Yes  _____________   Discharge Vitals Blood pressure (!) 161/79, pulse 63, temperature 98 F (36.7 C), temperature source Oral, resp. rate (!) 23, height 5' 8 (1.727 m), weight 99.8 kg, SpO2 97%.  Filed Weights   10/25/23 0810  Weight: 99.8 kg    Last Labs & Radiologic Studies    CBC No results for input(s): WBC, NEUTROABS, HGB, HCT, MCV, PLT in the last 72 hours. Basic Metabolic Panel No results for input(s): NA, K, CL, CO2, GLUCOSE, BUN, CREATININE, CALCIUM , MG, PHOS in the last 72 hours. Liver Function Tests No results for input(s): AST, ALT, ALKPHOS, BILITOT, PROT, ALBUMIN  in  the last 72 hours. No results for input(s): LIPASE, AMYLASE in the last 72 hours. High Sensitivity Troponin:   No results for input(s): TROPONINIHS in the last 720 hours.  BNP Invalid input(s): POCBNP D-Dimer No results for input(s): DDIMER in the last 72 hours. Hemoglobin A1C No results for input(s): HGBA1C in the last 72 hours. Fasting Lipid Panel No results for input(s): CHOL, HDL, LDLCALC, TRIG, CHOLHDL, LDLDIRECT in the last 72 hours. Thyroid  Function Tests No results for input(s): TSH, T4TOTAL, T3FREE, THYROIDAB in the last 72 hours.  Invalid input(s): FREET3 _____________  CARDIAC CATHETERIZATION Addendum Date: 10/25/2023   CULPRIT LESION: Prox RCA to  Mid RCA lesion is 70% stenosed.  Mid RCA-1 lesion is 80% stenosed. Mid RCA-2 lesion is 60% stenosed.  (SVG to RPDA is atretic)   A drug-eluting stent was successfully placed using a STENT SYNERGY XD 3.0X32.  The proximal 28 mm postdilated to 4.1 mm with the distal segment postdilated 3.5 mm.  Post intervention, there is a 0% residual stenosis.  TIMI-3 flow maintained   A 2ND stent was successfully placed overlapping the stent distally to ensure coverage of the final 60% stenosis, using a STENT SYNERGY XD 3.0X8.  This was postdilated at high atmospheres to 3.6 mm. Post intervention, there is a 0% residual stenosis.  TIMI-3 flow maintained   -------------------------------------------------------   Dist LM to Ost LAD lesion is 70% stenosed.  Prox LAD to Mid LAD lesion is 75% stenosed.   Ost 1st Mrg to 1st Mrg lesion is 75% stenosed.  Small caliber 2nd Mrg lesion is 80% stenosed. Ost 2nd Mrg lesion is 70% stenosed. Dist Cx lesion is 100% stenosed.   -----------------3/4 Patent GRAFTS-----------------------------------   LIMA-dLAD graft was visualized by angiography and is normal in caliber.  The graft exhibits no disease. There is competitive flow.   Seq SVG- SVG-OM1-OM3 graft was visualized by angiography and is large.  The graft exhibits no disease.  The flow is not reversed.  There is no competitive flow   SVG-rPDA graft was visualized by angiography and is small.  The graft exhibits severe diffuse disease.  Origin lesion is 90% stenosed. Prox Graft to Insertion lesion is 60% stenosed.   ----------------------- HEMODYNAMICS ---------------------------   The left ventricular systolic function is normal.  The left ventricular ejection fraction is greater than 65% by visual estimate.  There is no aortic valve stenosis.   Hemodynamic findings consistent with mild pulmonary hypertension.  Mean PAP 25 mL of mercury with LVEDP of 24 mmHg and PCWP of 22 mm..   Right Heart Cath: Mean PAP 25 mmHg is consistent with mild WHO  Class III Pulmonary Hypertension-PCWP 22 mmHg (LVEDP 2426 mm Q; with elevated RVEDP of 15 mL mercury and RAP of 12 mmHg.   Normal Fick Cardiac Output and Index: 6.48-3.08 Diagnostic Dominance: Right     Intervention  RECOMMENDATIONS   In the absence of any other complications or medical issues, we expect the patient to be ready for discharge from an interventional cardiology perspective on 10/25/2023.   Ensure that the patient is taking his Lasix  daily; With rate related left bundle branch block in the Cath Lab, will likely reduce beta-blocker dose in order to increase afterload reduction.  This can be done in the outpatient setting.   Recommend to resume Apixaban , at currently prescribed dose and frequency on 10/25/2023.   Recommend concurrent antiplatelet therapy of Aspirin  81 mg for 1 month and Clopidogrel  75mg  daily for 6 months .   Will  actually stop aspirin  at 2 weeks, and continue Plavix  for 6 months along with Eliquis .3 Alm Clay, MD   Result Date: 10/25/2023   CULPRIT LESION: Prox RCA to Mid RCA lesion is 70% stenosed.  Mid RCA-1 lesion is 80% stenosed. Mid RCA-2 lesion is 60% stenosed.  (SVG to RPDA is atretic)   A drug-eluting stent was successfully placed using a STENT SYNERGY XD 3.0X32.  The proximal 28 mm postdilated to 4.1 mm with the distal segment postdilated 3.5 mm.  Post intervention, there is a 0% residual stenosis.  TIMI-3 flow maintained   A 2ND stent was successfully placed overlapping the stent distally to ensure coverage of the final 60% stenosis, using a STENT SYNERGY XD 3.0X8.  This was postdilated at high atmospheres to 3.6 mm. Post intervention, there is a 0% residual stenosis.  TIMI-3 flow maintained   -------------------------------------------------------   Dist LM to Ost LAD lesion is 70% stenosed.  Prox LAD to Mid LAD lesion is 75% stenosed.   Ost 1st Mrg to 1st Mrg lesion is 75% stenosed.  Small caliber 2nd Mrg lesion is 80% stenosed. Ost 2nd Mrg lesion is 70% stenosed. Dist Cx  lesion is 100% stenosed.   -------------------GRAFTS-----------------------------------   A stent was successfully placed.   Post intervention, there is a 0% residual stenosis.   Post intervention, there is a 0% residual stenosis.   LIMA-dLAD graft was visualized by angiography and is normal in caliber.  The graft exhibits no disease. There is competitive flow.   Seq SVG- SVG-OM1-OM3 graft was visualized by angiography and is large.  The graft exhibits no disease.  The flow is not reversed.  There is no competitive flow   SVG-rPDA graft was visualized by angiography and is small.  The graft exhibits severe diffuse disease.  Origin lesion is 90% stenosed. Prox Graft to Insertion lesion is 60% stenosed.   ------------------------------------------------------   The left ventricular systolic function is normal.  The left ventricular ejection fraction is greater than 65% by visual estimate.  There is no aortic valve stenosis.   Hemodynamic findings consistent with mild pulmonary hypertension.  Mean PAP 25 mL of mercury with LVEDP of 24 mmHg and PCWP of 22 mm.. Severe three-vessel CAD: - Ostial LAD 70% stenosis with competitive flow from patent LIMA to LAD. - LCx has 3 OM branches with competitive flow in OM 1 and occlusion of the LCx after OM 2 with OM1 and OM 3 being filled via Seq SVG-OM1-OM3 that is widely patent. OM 2 was a relatively small caliber vessel with stable 70 and 80% stenoses and a roughly 2-2.25 mm vessel - not favorable for PCI. Large-caliber dominant RCA has progression of the mid stenosis to tandem 70% to 80% and 60% lesions (progressive disease). The SVG to RPDA is atretic with severe ostial 90% stenosis. ==> Successful DES PCI covering the 3 RCA lesions with a Synergy XD 32 mm and 3.0 x 8 mm distal overlapping stent postdilated and taper fashion from 4.1 down to 3.6 mm distally. 3/4 Patent Grafts: Patent LIMA to LAD & SeqSVG-OM1-OM3, and atretic SVG-RPDA Normal hyperdynamic LVEF with no RWMA.  Moderately elevated EDP of 24-26 mmHg. 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 Normal Fick Cardiac Output and Index: 6.48-3.08 RECOMMENDATIONS   In the absence of any other complications or medical issues, we expect the  patient to be ready for discharge from an interventional cardiology perspective on 10/25/2023.   Ensure that the patient is taking his Lasix  daily; With rate related left bundle branch block in the Cath Lab, will likely reduce beta-blocker dose in order to increase afterload reduction.  This can be done in the outpatient setting.   Recommend to resume Apixaban , at currently prescribed dose and frequency on 10/25/2023.   Recommend concurrent antiplatelet therapy of Aspirin  81 mg for 1 month and Clopidogrel  75mg  daily for 6 months .   Will actually stop aspirin  at 2 weeks, and continue Plavix  for 6 months along with Eliquis .3 Alm Clay, MD    Disposition   Pt is being discharged home today in good condition.  Follow-up Plans & Appointments     Discharge Instructions     AMB Referral to Cardiac Rehabilitation - Phase II   Complete by: As directed    Diagnosis:  Stable Angina Coronary Stents     After initial evaluation and assessments completed: Virtual Based Care may be provided alone or in conjunction with Phase 2 Cardiac Rehab based on patient barriers.: Yes   Intensive Cardiac Rehabilitation (ICR) [facility] location only OR Traditional Cardiac Rehabilitation (TCR) *If criteria for ICR are not met will enroll in TCR [facility] only): Yes        Discharge Medications   Allergies as of 10/25/2023   No Known Allergies      Medication List     PAUSE taking these medications    metFORMIN 500 MG 24 hr tablet Wait to take this until: October 28, 2023 Commonly known as: GLUCOPHAGE-XR Take 500 mg by mouth in the morning and at bedtime.        STOP taking these medications    celecoxib  200 MG capsule Commonly known as: CELEBREX        TAKE these medications    acetaminophen  500 MG tablet Commonly known as: TYLENOL  Take 2 tablets (1,000 mg total) by mouth every 6 (six) hours.   acyclovir  400 MG tablet Commonly known as: ZOVIRAX  Take 400 mg by mouth 2 (two) times daily.   apixaban  5 MG Tabs tablet Commonly known as: Eliquis  Take 1 tablet (5 mg total) by mouth 2 (two) times daily.   aspirin  EC 81 MG tablet Take 1 tablet (81 mg total) by mouth daily.   cetirizine 10 MG tablet Commonly known as: ZYRTEC Take 10 mg by mouth daily.   citalopram  20 MG tablet Commonly known as: CELEXA  Take 20 mg by mouth daily.   clopidogrel  75 MG tablet Commonly known as: Plavix  Take 1 tablet (75 mg total) by mouth daily.   Cosopt 2-0.5 % ophthalmic solution Generic drug: dorzolamide-timolol Place 1 drop into the right eye 2 (two) times daily.   ezetimibe  10 MG tablet Commonly known as: ZETIA  Take 1 tablet (10 mg total) by mouth daily.   Farxiga 10 MG Tabs tablet Generic drug: dapagliflozin propanediol Take 10 mg by mouth daily.   ferrous sulfate  325 (65 FE) MG tablet Take 325 mg by mouth daily.   furosemide  40 MG tablet Commonly known as: LASIX  TAKE 1 TABLET BY MOUTH EVERY MORNING AND 1/2 TABLET EVERY EVENING   icosapent  Ethyl 1 g capsule Commonly known as: VASCEPA  TAKE 2 CAPSULES(2 GRAMS) BY MOUTH TWICE DAILY   JOINT HEALTH PO Take 1 tablet by mouth daily.   levothyroxine  150 MCG tablet Commonly known as: SYNTHROID  TAKE 1 TABLET(150 MCG) BY MOUTH DAILY   lisinopril  10 MG tablet Commonly  known as: ZESTRIL  Take 1 tablet (10 mg total) by mouth daily.   metoprolol  tartrate 25 MG tablet Commonly known as: LOPRESSOR  Take 1 tablet (25 mg total) by mouth 2 (two) times daily.   nitroGLYCERIN  0.4 MG SL tablet Commonly known as: NITROSTAT  DISSOLVE 1 TABLET UNDER THE TONGUE EVERY 5 MINUTES AS NEEDED FOR  CHEST PAIN.   oxyCODONE  5 MG immediate release tablet Commonly known as: Oxy IR/ROXICODONE  Take 1 tablet (5 mg total) by mouth every 6 (six) hours as needed for severe pain.   Ozempic (1 MG/DOSE) 4 MG/3ML Sopn Generic drug: Semaglutide (1 MG/DOSE) Inject 1 mg into the skin every Tuesday.   pantoprazole  20 MG tablet Commonly known as: PROTONIX  TAKE 1 TABLET(20 MG) BY MOUTH TWICE DAILY   potassium chloride  20 MEQ packet Commonly known as: KLOR-CON  Take 20 mEq by mouth daily.   PRESERVISION AREDS 2 PO Take 1 tablet by mouth 2 (two) times daily.   rosuvastatin  40 MG tablet Commonly known as: CRESTOR  Take 1 tablet (40 mg total) by mouth daily. Please call [phone] to schedule a November appointment for future refills. Thank you.   zolpidem  10 MG tablet Commonly known as: AMBIEN  Take 10 mg by mouth at bedtime.        Allergies No Known Allergies  Outstanding Labs/Studies   N/a   Duration of Discharge Encounter   Greater than 30 minutes including physician time.  Signed, Manuelita Rummer, NP 10/25/2023, 1:22 PM

## 2023-10-26 ENCOUNTER — Encounter (HOSPITAL_COMMUNITY): Payer: Self-pay | Admitting: Cardiology

## 2023-10-27 ENCOUNTER — Telehealth: Payer: Self-pay | Admitting: Cardiology

## 2023-10-27 NOTE — Telephone Encounter (Signed)
 Received a call from patient he stated he had a cardiac cath this past Wed 9/17.Stated he wanted to make sure ok to remove bandage.Advised ok to remove bandage. Advised do not lift over 10 lbs for the next 5 to 7 days.Advised to keep post hospital appointment with Katlyn West NP 10/1 at 10:05 am.

## 2023-10-27 NOTE — Telephone Encounter (Signed)
 Pt is concerned about his procedure bandage. Please advise

## 2023-10-30 ENCOUNTER — Telehealth (HOSPITAL_COMMUNITY): Payer: Self-pay

## 2023-10-30 NOTE — Telephone Encounter (Signed)
 Attempted to call patient in regards to Cardiac Rehab - LM on VM

## 2023-10-31 DIAGNOSIS — H6123 Impacted cerumen, bilateral: Secondary | ICD-10-CM | POA: Diagnosis not present

## 2023-10-31 DIAGNOSIS — J3489 Other specified disorders of nose and nasal sinuses: Secondary | ICD-10-CM | POA: Diagnosis not present

## 2023-11-01 ENCOUNTER — Telehealth: Payer: Self-pay | Admitting: Cardiology

## 2023-11-01 NOTE — Telephone Encounter (Signed)
 Pt had stents done 10/25/23. He is not having any pain but is very fatigued. Pt is a bit concerned and would like a call back to discuss his symptoms. Please advise.

## 2023-11-01 NOTE — Telephone Encounter (Signed)
 Spoke to the patient and he reports that he had a cath on 10/25/23. Pt reports that he is still having occassional shortness of breath, weakness, and fatigue on exertion. Pt reports that he has been taking his medication as directed, but he has been off the use of Lasix . Pt declines weight gain and swelling. Pt does not check his blood pressure at home. Pt advised that it can take time post cath to start to feel better. Pt has post cath follow up on 10/1. Pt wants to know if he should be on lasix  and if we have any further recommendations?

## 2023-11-02 NOTE — Telephone Encounter (Signed)
 Called spoke to  patient -- per Dr Anner, restart Furosemide  40 mg  in the morning and  20 mg in the evening daily. For today only take 2 tablets and then starting tomorrow restart regular dosing.   Patient verbalized understanding  and states he will take medication once he hang the phone.

## 2023-11-03 DIAGNOSIS — T8579XS Infection and inflammatory reaction due to other internal prosthetic devices, implants and grafts, sequela: Secondary | ICD-10-CM | POA: Diagnosis not present

## 2023-11-05 NOTE — Progress Notes (Unsigned)
 Cardiology Office Note    Date:  11/05/2023  ID:  Carl [name], Carl [name] January 31, 1946, MRN [id number] PCP:  Carl Netter, PA  Cardiologist:  Carl Clay, MD  Electrophysiologist:  None   Chief Complaint: ***  History of Present Illness: Carl    Carl [name] is a 78 y.o. male with visit-pertinent history of typical atrial flutter, paroxysmal atrial fibrillation, chronic anticoagulation with Eliquis , CAD s/p CABG x 4 with LIMA to LAD, SVG to OM, SVG to diagonal circumflex, SVG to PDA in 2019, chronic diastolic heart failure, hypertension, hyperlipidemia, hypothyroidism and OSA.  Patient was previously followed by Dr. Burnard, has now transition care to Dr. Clay.  Patient's amiodarone  previously discontinued due to elevated LFTs and elevated TSH level. Patient underwent DCCV in 11/2018 however continued to have episodes of atrial flutter.  Patient underwent atrial flutter ablation with Dr. Waddell in December 2020.  Patient presented to the emergency room on 08/30/2023 with concerns regarding atrial fibrillation but left after not being seen by the MD.  His EKG showed A-fib with a rate of 117 bpm with aberrantly conducted beats versus PVCs.  Patient was seen by Dr. Clay on 10/17/2023 for progressive shortness of breath.  Patient reported that he noted since summer initially attributing it to the hot weather however would come completely out of breath after walking short distances.  He denies chest pain but reported very mild occasional indigestion-like symptoms when walking.  Cardiac catheterization was recommended.  On 10/25/2023 patient underwent cardiac catheterization, noted to have patent LIMA to LAD, sequential SVG to OM1-OM3 with atretic SVG to RPDA.  Patient had successful PCI/DES x 1 covering 3 lesions in the RCA with plan for triple therapy with aspirin , Plavix  and Eliquis  for 2 weeks then stopping aspirin  with continuation of Plavix  and Eliquis  for at least 6 months.  Is recommended that patient  take his Lasix  daily, noted to have a rate related left bundle branch block in the Cath Lab with recommendation to reduce beta-blocker dose in order to increase afterload reduction in outpatient setting.  On 11/01/2023 patient reported that he was having increased fatigue and shortness of breath, reported that he had not been taking any Lasix .  Patient was instructed to start Lasix  40 mg in the morning and 20 in the evening daily.  Today he presents for follow-up.  He reports that he     CAD: s/p CABG x 4 in 2019.  Patient with nuclear stress in 2020 that was reassuring.  Patient underwent cardiac catheterization in setting of progressive dyspnea on 10/25/2023 with Dr. Clay, noted to have patent LIMA to LAD, sequential SVG to OM1-OM3 with atretic SVG to RPDA.  Patient had successful PCI/DES x 1 covering 3 lesions in the RCA with plan for triple therapy with aspirin , Plavix  and Eliquis  for 2 weeks then stopping aspirin  with continuation of Plavix  and Eliquis  for at least 6 months.  Today he reports Continue Cardiac rehab  Chronic diastolic heart failure:  PAF/atrial flutter: S/p flutter ablation.  Patient previously intolerant to amiodarone  due to elevated LFTs and TSH.  Hypertension: Blood pressure today  OSA:  Labwork independently reviewed:   ROS: .   *** denies chest pain, shortness of breath, lower extremity edema, fatigue, palpitations, melena, hematuria, hemoptysis, diaphoresis, weakness, presyncope, syncope, orthopnea, and PND.  All other systems are reviewed and otherwise negative.  Studies Reviewed: Carl    EKG:  EKG is ordered today, personally reviewed, demonstrating ***     CV  Studies: Cardiac studies reviewed are outlined and summarized above. Otherwise please see EMR for full report. Cardiac Studies & Procedures   ______________________________________________________________________________________________ CARDIAC CATHETERIZATION  CARDIAC CATHETERIZATION  10/25/2023  Conclusion Table formatting from the original result was not included. Images from the original result were not included.    CULPRIT LESION: Prox RCA to Mid RCA lesion is 70% stenosed.  Mid RCA-1 lesion is 80% stenosed. Mid RCA-2 lesion is 60% stenosed.  (SVG to RPDA is atretic)   A drug-eluting stent was successfully placed using a STENT SYNERGY XD 3.0X32.  The proximal 28 mm postdilated to 4.1 mm with the distal segment postdilated 3.5 mm.  Post intervention, there is a 0% residual stenosis.  TIMI-3 flow maintained   A 2ND stent was successfully placed overlapping the stent distally to ensure coverage of the final 60% stenosis, using a STENT SYNERGY XD 3.0X8.  This was postdilated at high atmospheres to 3.6 mm. Post intervention, there is a 0% residual stenosis.  TIMI-3 flow maintained   -------------------------------------------------------   Dist LM to Ost LAD lesion is 70% stenosed.  Prox LAD to Mid LAD lesion is 75% stenosed.   Ost 1st Mrg to 1st Mrg lesion is 75% stenosed.  Small caliber 2nd Mrg lesion is 80% stenosed. Ost 2nd Mrg lesion is 70% stenosed. Dist Cx lesion is 100% stenosed.   -----------------3/4 Patent GRAFTS-----------------------------------   LIMA-dLAD graft was visualized by angiography and is normal in caliber.  The graft exhibits no disease. There is competitive flow.   Seq SVG- SVG-OM1-OM3 graft was visualized by angiography and is large.  The graft exhibits no disease.  The flow is not reversed.  There is no competitive flow   SVG-rPDA graft was visualized by angiography and is small.  The graft exhibits severe diffuse disease.  Origin lesion is 90% stenosed. Prox Graft to Insertion lesion is 60% stenosed.   ----------------------- HEMODYNAMICS ---------------------------   The left ventricular systolic function is normal.  The left ventricular ejection fraction is greater than 65% by visual estimate.  There is no aortic valve stenosis.   Hemodynamic  findings consistent with mild pulmonary hypertension.  Mean PAP 25 mL of mercury with LVEDP of 24 mmHg and PCWP of 22 mm..   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   Normal Fick Cardiac Output and Index: 6.48-3.08  Diagnostic Dominance: Right     Intervention   RECOMMENDATIONS   In the absence of any other complications or medical issues, we expect the patient to be ready for discharge from an interventional cardiology perspective on 10/25/2023.   Ensure that the patient is taking his Lasix  daily; With rate related left bundle branch block in the Cath Lab, will likely reduce beta-blocker dose in order to increase afterload reduction.  This can be done in the outpatient setting.   Recommend to resume Apixaban , at currently prescribed dose and frequency on 10/25/2023.   Recommend concurrent antiplatelet therapy of Aspirin  81 mg for 1 month and Clopidogrel  75mg  daily for 6 months .   Will actually stop aspirin  at 2 weeks, and continue Plavix  for 6 months along with Eliquis .3    Carl Clay, MD  Findings Coronary Findings Diagnostic  Dominance: Right  Left Main Dist LM to Ost LAD lesion is 70% stenosed.  Left Anterior Descending There is mild diffuse disease throughout the  vessel. Prox LAD to Mid LAD lesion is 75% stenosed.  Left Circumflex Dist Cx lesion is 100% stenosed.  First Obtuse Marginal Branch Ost 1st Mrg to 1st Mrg lesion is 75% stenosed.  Second Obtuse Marginal Branch Ost 2nd Mrg lesion is 70% stenosed. 2nd Mrg lesion is 80% stenosed.  Third Obtuse Marginal Branch Vessel is large in size.  Left Posterior Atrioventricular Artery Vessel is small in size.  Right Coronary Artery Prox RCA to Mid RCA lesion is 70% stenosed. The lesion is type B2, segmental and eccentric. The lesion is mildly calcified. Optical coherence  tomography (OCT) was performed. Minimum lumen area: 1.81 mm. Minimum stent area: 9.07 mm. Mid RCA-1 lesion is 80% stenosed. Mid RCA-2 lesion is 60% stenosed.  Acute Marginal Branch Vessel is small in size.  LIMA LIMA Graft To Mid LAD LIMA graft was visualized by angiography and is normal in caliber.  The graft exhibits no disease. There is competitive flow.  Sequential Jump Graft Graft To 1st Mrg, 3rd Mrg Seq SVG- SVG-OM1-OM3 graft was visualized by angiography and is large.  The graft exhibits no disease. The flow is not reversed. There is no competitive flow  Saphenous Graft To Ost RPDA SVG graft was visualized by angiography and is small.  The graft exhibits severe diffuse disease. The graft is atretic. Origin lesion is 90% stenosed. Prox Graft to Insertion lesion is 60% stenosed.  Intervention  Prox RCA to Mid RCA lesion Stent (Also treats lesions: Mid RCA-1) Lesion length:  34 mm. CATH VISTA GUIDE 6FR JR4 ECOPK guide catheter was inserted. Lesion crossed with guidewire using a WIRE ASAHI PROWATER 180CM. Pre-stent angioplasty was not performed. A drug-eluting stent was successfully placed using a STENT SYNERGY XD 3.0X32. Maximum pressure: 16 atm. Inflation time: 30 sec. Stent strut is well apposed. Post-stent angioplasty was performed using a BALLOON SAPPHIRE NC24 4.0X18. Maximum pressure:  20 atm. Inflation time:  20 sec. 2 inflations to cover the proximal 28 mm of the stent During present appointment, the patient took a deep breath in the catheter pullback bringing the stent back proximally roughly 4 mm.  This caused us  to not fully cover the distal 60% lesion.  A second stent will be placed. Stent (Also treats lesions: Mid RCA-1, and Mid RCA-2) A stent was successfully placed using a STENT SYNERGY XD 3.0X8. Maximum pressure: 16 atm. Inflation time: 20 sec. Stent strut is well apposed. Stent overlaps previously placed stent. Post-stent angioplasty was performed using a BALLOON [place]  EMERGE MR 3.5X6. Maximum pressure:  20 atm. Inflation time:  20 sec. 3 inflations to ensure the distal edge and adequate inflation in the overlap segment was achieved. Post-Intervention Lesion Assessment The intervention was successful. Pre-interventional TIMI flow is 3. Post-intervention TIMI flow is 3. Treated lesion length:  38 mm. No complications occurred at this lesion. Optical coherence tomography (OCT) was performed. Minimum stent area 9.07 mm. OCT supply: CATH DRAGONFLY OPSTAR. There is a 0% residual stenosis post intervention.  Mid RCA-1 lesion Stent (Also treats lesions: Prox RCA to Mid RCA) See details in Prox RCA to Mid RCA lesion. Stent (Also treats lesions: Prox RCA to Mid RCA, and Mid RCA-2) See details in Prox RCA to Mid RCA lesion. Post-Intervention Lesion Assessment The intervention was successful. Pre-interventional TIMI flow is 3. Post-intervention TIMI flow is 3. No complications occurred at this lesion. There is a 0% residual stenosis post intervention.  Mid RCA-2 lesion Stent A stent was successfully placed. Stent (Also treats lesions: Prox RCA  to Mid RCA, and Mid RCA-1) See details in Prox RCA to Mid RCA lesion. Post-Intervention Lesion Assessment The intervention was successful. Pre-interventional TIMI flow is 3. Post-intervention TIMI flow is 3. No complications occurred at this lesion. There is a 0% residual stenosis post intervention.   CARDIAC CATHETERIZATION  CARDIAC CATHETERIZATION 11/15/2017  Conclusion  Prox RCA to Mid RCA lesion is 60% stenosed.  Mid RCA lesion is 50% stenosed.  Prox Cx to Mid Cx lesion is 100% stenosed.  Ost 2nd Mrg lesion is 80% stenosed.  2nd Mrg lesion is 90% stenosed.  Ost 1st Mrg to 1st Mrg lesion is 50% stenosed.  Dist LM to Ost LAD lesion is 85% stenosed.  Prox LAD to Mid LAD lesion is 75% stenosed.  Severe multivessel CAD with 85% ostial LAD stenosis, diffuse 75% mid LAD stenosis; total occlusion of the mid AV  groove circumflex after the takeoff of the second obtuse marginal vessel with 50% diffuse stenosis in the first large marginal branch, 80 and 90% stenoses in the second obtuse marginal and extensive collateralization to the distal circumflex and third marginal via the LAD circulation; 60 and 50% mid RCA stenoses in a dominant RCA.  LVEDP 12 mm.  Previous echo documentation of hyperdynamic LV function with an EF of 65 to 70%.  Suspect high right radial artery take-off with stenosis/spasm above the elbow and retrograde filling of brachial artery  RECOMMENDATION: Patient will be admitted following his cardiac catheterization.  I have discussed the catheterization findings with Dr. Army who will proceed with CABG revascularization surgery tomorrow.  Findings Coronary Findings Diagnostic  Dominance: Right  Left Main Dist LM to Ost LAD lesion is 85% stenosed.  Left Anterior Descending Prox LAD to Mid LAD lesion is 75% stenosed.  Left Circumflex Prox Cx to Mid Cx lesion is 100% stenosed.  First Obtuse Marginal Branch Ost 1st Mrg to 1st Mrg lesion is 50% stenosed.  Second Obtuse Marginal Branch Ost 2nd Mrg lesion is 80% stenosed. 2nd Mrg lesion is 90% stenosed.  Third Obtuse Marginal Branch Collaterals 3rd Mrg filled by collaterals from 3rd Sept.  Right Coronary Artery Prox RCA to Mid RCA lesion is 60% stenosed. Mid RCA lesion is 50% stenosed.  Intervention  No interventions have been documented.   STRESS TESTS  MYOCARDIAL PERFUSION IMAGING 01/24/2019  Interpretation Summary  The left ventricular ejection fraction is normal (55-65%).  Nuclear stress EF: 60%.  There was no ST segment deviation noted during stress.  No T wave inversion was noted during stress.  The study is normal.  This is a low risk study.  1.  Normal myocardial perfusion imaging study without evidence of ischemia or infarction. 2.  Normal left ventricular ejection fraction, 60%. 3.  Abnormal  septal motion consistent with a postoperative state. 4.  This is a low risk study.  Darryle T. Barbaraann, MD [facility] [address], [address] [place], [place] [address] [phone] 4:50 PM   ECHOCARDIOGRAM  ECHOCARDIOGRAM COMPLETE 08/02/2021  Narrative ECHOCARDIOGRAM REPORT    Patient Name:   Carl [name]   Date of Exam: 08/02/2021 Medical Rec #:  [id number]     Height:       68.0 in Accession #:    [id number]    Weight:       213.0 lb Date of Birth:  August 06, 1945      BSA:          2.099 m Patient Age:    82 years  BP:           138/70 mmHg Patient Gender: M             HR:           55 bpm. Exam Location:  [address]  Procedure: 2D Echo, Cardiac Doppler, Color Doppler and Intracardiac Opacification Agent  Indications:    I48.91 Atrial fibrillation  History:        Patient has prior history of Echocardiogram examinations, most recent 10/28/2020. CAD, Prior CABG, TIA, Arrythmias:Atrial Fibrillation and Atrial Flutter; Risk Factors:Hypertension, Sleep Apnea and Dyslipidemia.  Sonographer:    Marshia Lawyer BS, RDCS Referring Phys: [phone] THOMAS A KELLY   Sonographer Comments: Technically difficult study due to poor echo windows and suboptimal apical window. IMPRESSIONS   1. Left ventricular ejection fraction, by estimation, is 65 to 70%. The left ventricle has normal function. The left ventricle has no regional wall motion abnormalities. Left ventricular diastolic parameters are indeterminate. 2. Right ventricular systolic function is normal. The right ventricular size is normal. There is mildly elevated pulmonary artery systolic pressure. 3. Left atrial size was mildly dilated. 4. Right atrial size was moderately dilated. 5. Mild mitral valve regurgitation. 6. Tricuspid valve regurgitation is mild to moderate. 7. The aortic valve is tricuspid. Aortic valve regurgitation is mild. Aortic valve sclerosis/calcification is present, without any evidence of  aortic stenosis. 8. The inferior vena cava is dilated in size with <50% respiratory variability, suggesting right atrial pressure of 15 mmHg.  Comparison(s): The left ventricular function is unchanged. 10/28/20 EF 55-60%.  FINDINGS Left Ventricle: Left ventricular ejection fraction, by estimation, is 65 to 70%. The left ventricle has normal function. The left ventricle has no regional wall motion abnormalities. Definity  contrast agent was given IV to delineate the left ventricular endocardial borders. The left ventricular internal cavity size was normal in size. There is no left ventricular hypertrophy. Left ventricular diastolic parameters are indeterminate.  Right Ventricle: The right ventricular size is normal. Right vetricular wall thickness was not assessed. Right ventricular systolic function is normal. There is mildly elevated pulmonary artery systolic pressure. The tricuspid regurgitant velocity is 2.68 m/s, and with an assumed right atrial pressure of 8 mmHg, the estimated right ventricular systolic pressure is 36.7 mmHg.  Left Atrium: Left atrial size was mildly dilated.  Right Atrium: Right atrial size was moderately dilated.  Pericardium: There is no evidence of pericardial effusion.  Mitral Valve: There is mild thickening of the mitral valve leaflet(s). Mild mitral valve regurgitation.  Tricuspid Valve: The tricuspid valve is normal in structure. Tricuspid valve regurgitation is mild to moderate.  Aortic Valve: The aortic valve is tricuspid. Aortic valve regurgitation is mild. Aortic regurgitation PHT measures 618 msec. Aortic valve sclerosis/calcification is present, without any evidence of aortic stenosis.  Pulmonic Valve: The pulmonic valve was not well visualized. Pulmonic valve regurgitation is mild.  Aorta: The aortic root and ascending aorta are structurally normal, with no evidence of dilitation.  Venous: The inferior vena cava is dilated in size with less than 50%  respiratory variability, suggesting right atrial pressure of 15 mmHg.  IAS/Shunts: No atrial level shunt detected by color flow Doppler.   LEFT VENTRICLE PLAX 2D LVIDd:         4.70 cm   Diastology LVIDs:         2.90 cm   LV e' medial:    8.55 cm/s LV PW:         1.00 cm   LV  E/e' medial:  12.2 LV IVS:        0.90 cm   LV e' lateral:   11.70 cm/s LVOT diam:     2.40 cm   LV E/e' lateral: 8.9 LV SV:         138 LV SV Index:   66 LVOT Area:     4.52 cm   RIGHT VENTRICLE             IVC RV Basal diam:  5.40 cm     IVC diam: 2.20 cm RV Mid diam:    3.80 cm RV S prime:     11.10 cm/s TAPSE (M-mode): 1.6 cm RVSP:           36.7 mmHg  LEFT ATRIUM             Index        RIGHT ATRIUM           Index LA diam:        4.80 cm 2.29 cm/m   RA Pressure: 8.00 mmHg LA Vol (A2C):   79.1 ml 37.68 ml/m  RA Area:     27.50 cm LA Vol (A4C):   67.3 ml 32.06 ml/m  RA Volume:   107.00 ml 50.97 ml/m LA Biplane Vol: 73.1 ml 34.82 ml/m AORTIC VALVE LVOT Vmax:   117.00 cm/s LVOT Vmean:  76.000 cm/s LVOT VTI:    0.306 m AI PHT:      618 msec  AORTA Ao Root diam: 3.50 cm Ao Asc diam:  3.70 cm  MITRAL VALVE                TRICUSPID VALVE TR Peak grad:   28.7 mmHg MV Decel Time: 195 msec     TR Vmax:        268.00 cm/s MV E velocity: 104.00 cm/s  Estimated RAP:  8.00 mmHg MV A velocity: 59.70 cm/s   RVSP:           36.7 mmHg MV E/A ratio:  1.74 SHUNTS Systemic VTI:  0.31 m Systemic Diam: 2.40 cm  Vina Gull MD Electronically signed by Vina Gull MD Signature Date/Time: 08/02/2021/8:14:06 PM    Final   TEE  ECHO TEE 11/16/2017  Interpretation Summary  Septum: No Patent Foramen Ovale present.  Left atrium: Patent foramen ovale not present.  Aortic valve: The valve is trileaflet. Mild valve thickening present. No stenosis. Trace regurgitation.  Tricuspid valve: Mild regurgitation. The tricuspid valve regurgitation jet is central.  Pulmonic valve: Trace regurgitation.   Mitral valve: Moderate leaflet thickening is present. Mild leaflet calcification is present. Mild mitral annular calcification. Mild regurgitation.  Aorta: The ascending aorta is dilated.  Right ventricle: Normal cavity size, wall thickness and ejection fraction.  Right ventricle: Normal cavity size and ejection fraction.    CT SCANS  CT CORONARY FRACTIONAL FLOW RESERVE DATA PREP 11/09/2017  Narrative CLINICAL DATA:  CAD  EXAM: FFR CT  TECHNIQUE: The best systolic and diastolic phases of the patients gated cardiac CT scan sent to Heart Flow for hemodynamic analysis  FINDINGS: FFR CT positive in mid RCA .78 mid LAD .78 and AV groove .71  IMPRESSION: Positive FFR CT in mid RCA and mid LAD AV groove branch small and not likely significant  Maude Emmer   Electronically Signed By: Maude Emmer M.D. On: 11/10/2017 09:23   CT CORONARY MORPH W/CTA COR W/SCORE 11/09/2017  Addendum 11/09/2017  4:12 PM ADDENDUM REPORT: 11/09/2017 16:10  CLINICAL  DATA:  Chest pain  EXAM: Cardiac CTA  MEDICATIONS: Sub lingual nitro. 4mg  and lopressor  5mg   TECHNIQUE: The patient was scanned on a [employer] scanner. Gantry rotation speed was 270 msecs. Collimation was.9 mm. A 100 kV prospective scan was triggered in the descending thoracic aorta at 111 HU's with 5% padding centered around 78% of the R-R interval. Average HR during the scan was 62 bpm. The 3D data set was interpreted on a dedicated work station using MPR, MIP and VRT modes. A total of 80 cc of contrast was used.  FINDINGS: Non-cardiac: See separate report from [facility]. No significant findings on limited lung and soft tissue windows.  Calcium  Score: Calcium  noted in all 3 major epicardial coronary vessels  Coronary Arteries: Right dominant with no anomalies  LM: Normal  LAD: > 75% ostial and mid vessel calcific plaque. Less than 50% distal calcific plaque  IM: Small vessel not well seen  D1:  Greater than 75% calcific plaque proximally and mid vessel  D2: Normal  Circumflex: Less than 50% proximal plaque  OM1: Less than 50% proximal plaque  OM2: 50-75% mixed plaque in AV groove  RCA: 50% calcified plaque proximally 50-75% calcified plaque in mid vessel  PDA: Normal  PLA: Normal  IMPRESSION: 1. Calcium  score 825 which is 78 th percentile for age and sex  2. Obstructive CAD see description above Study will be sent for FFR CT but patient will likely need heart catheterization  3.  Mild aortic root dilatation 4.1 cm  Maude Emmer   Electronically Signed By: Maude Emmer M.D. On: 11/09/2017 16:10  Narrative EXAM: OVER-READ INTERPRETATION  CT CHEST  The following report is an over-read performed by radiologist Dr. Franky Crease of [facility] on 11/09/2017. This over-read does not include interpretation of cardiac or coronary anatomy or pathology. The coronary CTA interpretation by the cardiologist is attached.  COMPARISON:  None.  FINDINGS: Vascular: Heart is normal size.  Visualized aorta is normal caliber.  Mediastinum/Nodes: No adenopathy in the lower mediastinum or hila.  Lungs/Pleura: Dependent atelectasis in the lower lobes. No effusions.  Upper Abdomen: Imaging into the upper abdomen shows no acute findings.  Musculoskeletal: Chest wall soft tissues are unremarkable. No acute bony abnormality.  IMPRESSION: No acute or significant extracardiac abnormality.  Electronically Signed: By: Franky Crease M.D. On: 11/09/2017 15:46     ______________________________________________________________________________________________       Current Reported Medications:.    No outpatient medications have been marked as taking for the 11/08/23 encounter (Appointment) with Alexio Sroka D, NP.    Physical Exam:    VS:  There were no vitals taken for this visit.   Wt Readings from Last 3 Encounters:  10/25/23 220 lb (99.8 kg)   10/17/23 220 lb 3.2 oz (99.9 kg)  12/23/22 232 lb (105.2 kg)    GEN: Well nourished, well developed in no acute distress NECK: No JVD; No carotid bruits CARDIAC: ***RRR, no murmurs, rubs, gallops RESPIRATORY:  Clear to auscultation without rales, wheezing or rhonchi  ABDOMEN: Soft, non-tender, non-distended EXTREMITIES:  No edema; No acute deformity     Asessement and Plan:.     ***  {The patient has an active order for outpatient cardiac rehabilitation.   Please indicate if the patient is ready to start. Do NOT delete this.  It will auto delete.  Refresh note, then sign.              Click here to document readiness and  see contraindications.  :1}  Cardiac Rehabilitation Eligibility Assessment      Disposition: F/u with ***  Signed, Mishelle Hassan D Demetric Parslow, NP

## 2023-11-06 DIAGNOSIS — T8579XS Infection and inflammatory reaction due to other internal prosthetic devices, implants and grafts, sequela: Secondary | ICD-10-CM | POA: Diagnosis not present

## 2023-11-07 DIAGNOSIS — I480 Paroxysmal atrial fibrillation: Secondary | ICD-10-CM | POA: Diagnosis not present

## 2023-11-07 DIAGNOSIS — E785 Hyperlipidemia, unspecified: Secondary | ICD-10-CM | POA: Diagnosis not present

## 2023-11-07 DIAGNOSIS — E669 Obesity, unspecified: Secondary | ICD-10-CM | POA: Diagnosis not present

## 2023-11-07 DIAGNOSIS — E1159 Type 2 diabetes mellitus with other circulatory complications: Secondary | ICD-10-CM | POA: Diagnosis not present

## 2023-11-08 ENCOUNTER — Encounter: Payer: Self-pay | Admitting: Cardiology

## 2023-11-08 ENCOUNTER — Ambulatory Visit: Attending: Internal Medicine | Admitting: Cardiology

## 2023-11-08 VITALS — BP 130/64 | HR 62 | Ht 68.0 in | Wt 218.2 lb

## 2023-11-08 DIAGNOSIS — I2510 Atherosclerotic heart disease of native coronary artery without angina pectoris: Secondary | ICD-10-CM

## 2023-11-08 DIAGNOSIS — I483 Typical atrial flutter: Secondary | ICD-10-CM | POA: Diagnosis not present

## 2023-11-08 DIAGNOSIS — I10 Essential (primary) hypertension: Secondary | ICD-10-CM | POA: Diagnosis not present

## 2023-11-08 DIAGNOSIS — Z951 Presence of aortocoronary bypass graft: Secondary | ICD-10-CM

## 2023-11-08 DIAGNOSIS — G4733 Obstructive sleep apnea (adult) (pediatric): Secondary | ICD-10-CM | POA: Diagnosis not present

## 2023-11-08 DIAGNOSIS — I5032 Chronic diastolic (congestive) heart failure: Secondary | ICD-10-CM

## 2023-11-08 DIAGNOSIS — I480 Paroxysmal atrial fibrillation: Secondary | ICD-10-CM

## 2023-11-08 DIAGNOSIS — I25119 Atherosclerotic heart disease of native coronary artery with unspecified angina pectoris: Secondary | ICD-10-CM

## 2023-11-08 MED ORDER — EZETIMIBE 10 MG PO TABS: 10.0000 mg | ORAL_TABLET | Freq: Every day | ORAL | 3 refills | Status: AC

## 2023-11-08 MED ORDER — CLOPIDOGREL BISULFATE 75 MG PO TABS: 75.0000 mg | ORAL_TABLET | Freq: Every day | ORAL | 3 refills | Status: AC

## 2023-11-08 MED ORDER — METOPROLOL TARTRATE 25 MG PO TABS
12.5000 mg | ORAL_TABLET | Freq: Two times a day (BID) | ORAL | 3 refills | Status: AC
Start: 2023-11-08 — End: ?

## 2023-11-08 NOTE — Patient Instructions (Signed)
 Medication Instructions:  STOP ASPIRIN   DECREASE METOPROLOL  TARTRATE TO 12.5 MG TAKE ONE HALF TABLET TWICE DAILY *If you need a refill on your cardiac medications before your next appointment, please call your pharmacy*  Lab Work: TODAY- CBC, BMET If you have labs (blood work) drawn today and your tests are completely normal, you will receive your results only by: MyChart Message (if you have MyChart) OR A paper copy in the mail If you have any lab test that is abnormal or we need to change your treatment, we will call you to review the results.   Follow-Up: At [facility], you and your health needs are our priority.  As part of our continuing mission to provide you with exceptional heart care, our providers are all part of one team.  This team includes your primary Cardiologist (physician) and Advanced Practice Providers or APPs (Physician Assistants and Nurse Practitioners) who all work together to provide you with the care you need, when you need it.  Your next appointment:   4 week(s)  Provider:   Katlyn West, NP          We recommend signing up for the patient portal called MyChart.  Sign up information is provided on this After Visit Summary.  MyChart is used to connect with patients for Virtual Visits (Telemedicine).  Patients are able to view lab/test results, encounter notes, upcoming appointments, etc.  Non-urgent messages can be sent to your provider as well.   To learn more about what you can do with MyChart, go to [email or web address].   Other Instructions Please let us  know if you have been taking your Lisinopril , Zetia , and Vascepa   Please bring all medications to your next visit

## 2023-11-09 ENCOUNTER — Ambulatory Visit: Payer: Self-pay | Admitting: Cardiology

## 2023-11-09 DIAGNOSIS — T8579XS Infection and inflammatory reaction due to other internal prosthetic devices, implants and grafts, sequela: Secondary | ICD-10-CM | POA: Diagnosis not present

## 2023-11-09 LAB — BASIC METABOLIC PANEL WITH GFR
BUN/Creatinine Ratio: 17 (ref 10–24)
BUN: 18 mg/dL (ref 8–27)
CO2: 23 mmol/L (ref 20–29)
Calcium: 9.5 mg/dL (ref 8.6–10.2)
Chloride: 94 mmol/L — ABNORMAL LOW (ref 96–106)
Creatinine, Ser: 1.08 mg/dL (ref 0.76–1.27)
Glucose: 115 mg/dL — ABNORMAL HIGH (ref 70–99)
Potassium: 4 mmol/L (ref 3.5–5.2)
Sodium: 137 mmol/L (ref 134–144)
eGFR: 70 mL/min/1.73 (ref >59)

## 2023-11-09 LAB — CBC
Hematocrit: 49.6 % (ref 37.5–51.0)
Hemoglobin: 17 g/dL (ref 13.0–17.7)
MCH: 34.4 pg — ABNORMAL HIGH (ref 26.6–33.0)
MCHC: 34.3 g/dL (ref 31.5–35.7)
MCV: 100 fL — ABNORMAL HIGH (ref 79–97)
Platelets: 238 x10E3/uL (ref 150–450)
RBC: 4.94 x10E6/uL (ref 4.14–5.80)
RDW: 12.3 % (ref 11.6–15.4)
WBC: 8.5 x10E3/uL (ref 3.4–10.8)

## 2023-11-09 NOTE — Progress Notes (Signed)
 Pt has been made aware of normal result and verbalized understanding.  jw

## 2023-11-13 DIAGNOSIS — T8579XS Infection and inflammatory reaction due to other internal prosthetic devices, implants and grafts, sequela: Secondary | ICD-10-CM | POA: Diagnosis not present

## 2023-11-15 ENCOUNTER — Telehealth (HOSPITAL_COMMUNITY): Payer: Self-pay

## 2023-11-15 NOTE — Telephone Encounter (Signed)
 Pt insurance is active and benefits verified through [employer]. Co-pay $15, DED $0/$0 met, out of pocket $3,400/$1,504.63 met, co-insurance 0%. No pre-authorization required. 11/15/2023 @ 3:13pm, spoke with Anushka, REF# [id number].  TCR/ICR? ICR Visit(date of service)limitation? No Can multiple codes be used on the same date of service/visit?(IF ITS A LIMIT) N/A  Is this a lifetime maximum or an annual maximum? Annual Has the member used any of these services to date? No Is there a time limit (weeks/months) on start of program and/or program completion? No

## 2023-11-15 NOTE — Telephone Encounter (Signed)
 Attempted to call patient regarding scheduling cardiac rehab- no answer, left message. Sent MyChart message.

## 2023-11-17 ENCOUNTER — Encounter (INDEPENDENT_AMBULATORY_CARE_PROVIDER_SITE_OTHER): Admitting: Ophthalmology

## 2023-11-17 ENCOUNTER — Ambulatory Visit (HOSPITAL_COMMUNITY): Attending: Cardiology

## 2023-11-17 DIAGNOSIS — H43813 Vitreous degeneration, bilateral: Secondary | ICD-10-CM | POA: Diagnosis not present

## 2023-11-17 DIAGNOSIS — Z7901 Long term (current) use of anticoagulants: Secondary | ICD-10-CM | POA: Diagnosis not present

## 2023-11-17 DIAGNOSIS — H4311 Vitreous hemorrhage, right eye: Secondary | ICD-10-CM

## 2023-11-17 DIAGNOSIS — M4312 Spondylolisthesis, cervical region: Secondary | ICD-10-CM | POA: Diagnosis not present

## 2023-11-17 DIAGNOSIS — R531 Weakness: Secondary | ICD-10-CM | POA: Diagnosis not present

## 2023-11-17 DIAGNOSIS — W19XXXA Unspecified fall, initial encounter: Secondary | ICD-10-CM | POA: Diagnosis not present

## 2023-11-17 DIAGNOSIS — H353221 Exudative age-related macular degeneration, left eye, with active choroidal neovascularization: Secondary | ICD-10-CM

## 2023-11-17 DIAGNOSIS — Z7982 Long term (current) use of aspirin: Secondary | ICD-10-CM | POA: Diagnosis not present

## 2023-11-17 DIAGNOSIS — I672 Cerebral atherosclerosis: Secondary | ICD-10-CM | POA: Diagnosis not present

## 2023-11-17 DIAGNOSIS — Z7902 Long term (current) use of antithrombotics/antiplatelets: Secondary | ICD-10-CM | POA: Diagnosis not present

## 2023-11-17 DIAGNOSIS — I498 Other specified cardiac arrhythmias: Secondary | ICD-10-CM | POA: Diagnosis not present

## 2023-11-17 DIAGNOSIS — Z79899 Other long term (current) drug therapy: Secondary | ICD-10-CM | POA: Diagnosis not present

## 2023-11-23 NOTE — Progress Notes (Signed)
 [facility] - Clinic Note  11/24/2023   CHIEF COMPLAINT Patient presents for Retina Evaluation  HISTORY OF PRESENT ILLNESS: Carl [name] is a 78 y.o. male who presents to the clinic today for:  HPI     Retina Evaluation   In right eye.        Comments   Patient here for Retina Evaluation. Referred by Dr Alvia. Patient states vision a bit little better. No eye pain. Ran out of red top drop yesterday AM. Still taking other drops.       Last edited by Orval Asberry RAMAN, COA on 11/24/2023 10:38 AM.     Pt states VA in OD is improving.   Referring physician: Sheldon Netter, PA [address] [place],  [place] [address]  HISTORICAL INFORMATION:  Selected notes from the MEDICAL RECORD NUMBER Referred by Dr. JAMA:  Ocular Hx- PMH-   CURRENT MEDICATIONS: Current Outpatient Medications (Ophthalmic Drugs)  Medication Sig   COSOPT 2-0.5 % ophthalmic solution Place 1 drop into the right eye 2 (two) times daily.   No current facility-administered medications for this visit. (Ophthalmic Drugs)   Current Outpatient Medications (Other)  Medication Sig   acyclovir  (ZOVIRAX ) 400 MG tablet Take 400 mg by mouth 2 (two) times daily.   apixaban  (ELIQUIS ) 5 MG TABS tablet Take 1 tablet (5 mg total) by mouth 2 (two) times daily.   cetirizine (ZYRTEC) 10 MG tablet Take 10 mg by mouth daily.   citalopram  (CELEXA ) 20 MG tablet Take 20 mg by mouth daily.   clopidogrel  (PLAVIX ) 75 MG tablet Take 1 tablet (75 mg total) by mouth daily.   ezetimibe  (ZETIA ) 10 MG tablet Take 1 tablet (10 mg total) by mouth daily.   FARXIGA 10 MG TABS tablet Take 10 mg by mouth daily.   ferrous sulfate  325 (65 FE) MG tablet Take 325 mg by mouth daily.   furosemide  (LASIX ) 40 MG tablet TAKE 1 TABLET BY MOUTH EVERY MORNING AND 1/2 TABLET EVERY EVENING   icosapent  Ethyl (VASCEPA ) 1 g capsule TAKE 2 CAPSULES(2 GRAMS) BY MOUTH TWICE DAILY   metFORMIN (GLUCOPHAGE-XR) 500 MG 24 hr tablet Take  500 mg by mouth in the morning and at bedtime.   metoprolol  tartrate (LOPRESSOR ) 25 MG tablet Take 0.5 tablets (12.5 mg total) by mouth 2 (two) times daily.   Misc Natural Products (JOINT HEALTH PO) Take 1 tablet by mouth daily.   Multiple Vitamins-Minerals (PRESERVISION AREDS 2 PO) Take 1 tablet by mouth 2 (two) times daily.   nitroGLYCERIN  (NITROSTAT ) 0.4 MG SL tablet DISSOLVE 1 TABLET UNDER THE TONGUE EVERY 5 MINUTES AS NEEDED FOR CHEST PAIN.   oxyCODONE  (OXY IR/ROXICODONE ) 5 MG immediate release tablet Take 1 tablet (5 mg total) by mouth every 6 (six) hours as needed for severe pain.   OZEMPIC, 1 MG/DOSE, 4 MG/3ML SOPN Inject 1 mg into the skin every Tuesday.   pantoprazole  (PROTONIX ) 20 MG tablet TAKE 1 TABLET(20 MG) BY MOUTH TWICE DAILY   potassium chloride  (KLOR-CON ) 20 MEQ packet Take 20 mEq by mouth daily.   rosuvastatin  (CRESTOR ) 40 MG tablet Take 1 tablet (40 mg total) by mouth daily. Please call [phone] to schedule a November appointment for future refills. Thank you.   zolpidem  (AMBIEN ) 10 MG tablet Take 10 mg by mouth at bedtime.   acetaminophen  (TYLENOL ) 500 MG tablet Take 2 tablets (1,000 mg total) by mouth every 6 (six) hours.   levothyroxine  (SYNTHROID ) 150 MCG tablet TAKE 1  TABLET(150 MCG) BY MOUTH DAILY (Patient not taking: Reported on 11/24/2023)   lisinopril  (ZESTRIL ) 10 MG tablet Take 1 tablet (10 mg total) by mouth daily.   No current facility-administered medications for this visit. (Other)   REVIEW OF SYSTEMS: ROS   Positive for: Gastrointestinal, Musculoskeletal, Cardiovascular, Eyes Last edited by Orval Asberry RAMAN, COA on 11/24/2023 10:41 AM.     ALLERGIES No Known Allergies PAST MEDICAL HISTORY Past Medical History:  Diagnosis Date   Anxiety    takes Xanax  daily as needed   Arthritis    CAD (coronary artery disease) 11/2017   Cath:  p-mRCA 60%, mRCA 50%; dLM-Ost LAD 85%, p-mLAD 75%; p-mLCx 100% after OM1 w/ ost 80% & OM2 w/ ost 80% & prox 90% => s/p  CABG x 4 (October 2019-DrSABRA Jude): LIMA-LAD, SVG-OM1-dCx, SVG-rPDA   Cataract    removed both eyes   Chronic diastolic CHF (congestive heart failure) (HCC) 01/2019   Normal LVEF of 65 to 70%.  Indeterminate diastolic parameters.  (Admitted September 2022)   Depression    takes Celexa  daily   Diverticulitis    Diverticulosis    Enlarged prostate    sightly   Esophagitis    Gastric ulcer    GERD (gastroesophageal reflux disease)    takes Protonix  daily   History of TIA (transient ischemic attack)    Hx of adenomatous colonic polyps    benign   Hyperlipidemia    Takes rosuvastatin  40 mg daily, Zetia  10 mg daily and Vascepa  2 g twice daily   Hypertension    Hypothyroidism    Insomnia    takes Ambien  nightly   Joint pain    Joint swelling    OSA (obstructive sleep apnea)    Paroxysmal atrial fibrillation (HCC)    On Eliquis    Pre-diabetes    Past Surgical History:  Procedure Laterality Date   A-FLUTTER ABLATION N/A 01/11/2019   Procedure: A-FLUTTER ABLATION;  Surgeon: Waddell Danelle ORN, MD;  Location: [facility] INVASIVE CV LAB;  Service: Cardiovascular;  Laterality: N/A;   CARDIOVERSION N/A 11/20/2018   Procedure: CARDIOVERSION;  Surgeon: Burnard Debby LABOR, MD;  Location: [facility] ENDOSCOPY;  Service: Cardiovascular;  Laterality: N/A;   cartliage removed from nose  7yrs ago   cataract surgery Bilateral    CERVICAL SPINE SURGERY     COLONOSCOPY     CORONARY ARTERY BYPASS GRAFT N/A 11/16/2017   Procedure: CORONARY ARTERY BYPASS GRAFTING (CABG) x4. LEFT ENDOSCOPIC SAPHENOUS VEIN HARVEST AND MAMMARY ARTERY TAKE DOWN. LIMA TO LAD, SVG TO PDA, SVG TO DISTAL CIRC & OMI.;  Surgeon: Jude Dallas NOVAK, MD;  Location: [facility] OR;  Service: Open Heart Surgery;  Laterality: N/A;   CORONARY IMAGING/OCT N/A 10/25/2023   Procedure: CORONARY IMAGING/OCT;  Surgeon: Anner Alm ORN, MD;  Location: [facility] INVASIVE CV LAB;  Service: Cardiovascular;  Laterality: N/A;   CORONARY STENT INTERVENTION N/A 10/25/2023    Procedure: CORONARY STENT INTERVENTION;  Surgeon: Anner Alm ORN, MD;  Location: [facility] INVASIVE CV LAB;  Service: Cardiovascular;  Laterality: N/A;   EYE SURGERY     FOREIGN BODY REMOVAL ESOPHAGEAL     KNEE SURGERY Left    couple of times   LEFT HEART CATH AND CORONARY ANGIOGRAPHY N/A 11/15/2017   Procedure: LEFT HEART CATH AND CORONARY ANGIOGRAPHY;  Surgeon: Burnard Debby LABOR, MD;  Location: [facility] INVASIVE CV LAB;  Service: Cardiovascular: Proximal to mid RCA 60%, mid RCA 50%; distal LM-ostial LAD 85% with proximal to mid LAD 75%; proximal mid LCx  100%, ostial OM1 50%, OM2 80% and 90%;   NASAL SINUS SURGERY     NM MYOVIEW  LTD  01/2019   Normal myocardial perfusion imaging study without evidence of ischemia or infarction.  EF estimated 60%.  Abnormal septal motion consistent with postoperative state.  LOW RISK   PENILE PROSTHESIS IMPLANT N/A 03/08/2022   Procedure: INSERTION OF INFLATABLE PENILE IMPLANT;  Surgeon: Lovie Arlyss CROME, MD;  Location: [facility] ORS;  Service: Urology;  Laterality: N/A;  90 MINUTES NEEDED FOR CASE   POLYPECTOMY     RIGHT/LEFT HEART CATH AND CORONARY/GRAFT ANGIOGRAPHY N/A 10/25/2023   Procedure: RIGHT/LEFT HEART CATH AND CORONARY/GRAFT ANGIOGRAPHY;  Surgeon: Anner Alm ORN, MD;  Location: [facility] INVASIVE CV LAB;  Service: Cardiovascular;  Laterality: N/A;   TEE WITHOUT CARDIOVERSION N/A 11/16/2017   Procedure: TRANSESOPHAGEAL ECHOCARDIOGRAM (TEE);  Surgeon: Army Dallas NOVAK, MD;  Location: [facility] OR;  Service: Open Heart Surgery;  Laterality: N/A;   TONSILLECTOMY     TOTAL HIP ARTHROPLASTY Right 04/17/2020   Procedure: RIGHT TOTAL HIP ARTHROPLASTY ANTERIOR APPROACH;  Surgeon: Vernetta Lonni GRADE, MD;  Location: [facility] ORS;  Service: Orthopedics;  Laterality: Right;   TOTAL KNEE ARTHROPLASTY Left 06/17/2014   Procedure: TOTAL KNEE ARTHROPLASTY;  Surgeon: Maude ORN Right, MD;  Location: [facility] OR;  Service: Orthopedics;  Laterality: Left;   TRANSTHORACIC ECHOCARDIOGRAM  07/2021   Normal  LV size and function with EF of 65 to 70%.  No RWMA.  Indeterminate diastolic parameters-mildly dilated LA..  Mildly elevated PAP with moderate dilated RA and moderately elevated RAP estimated 15 mmHg..  Mild to moderate TR.  Mild AI with AoV sclerosis but no stenosis.   FAMILY HISTORY Family History  Problem Relation Age of Onset   Colon cancer Paternal Grandfather        questionable   Colon polyps Neg Hx    Esophageal cancer Neg Hx    Rectal cancer Neg Hx    Stomach cancer Neg Hx    SOCIAL HISTORY Social History   Tobacco Use   Smoking status: Never   Smokeless tobacco: Never  Vaping Use   Vaping status: Never Used  Substance Use Topics   Alcohol use: Yes    Alcohol/week: 10.0 standard drinks of alcohol    Types: 10 Standard drinks or equivalent per week    Comment: 2 drinks 5 nights per week   Drug use: No       OPHTHALMIC EXAM:  Base Eye Exam     Visual Acuity (Snellen - Linear)       Right Left   Dist cc CF at 3' 20/40 -2   Dist ph cc NI NI    Correction: Glasses  Glasses had clear lenses glasses.         Tonometry (Tonopen, 10:33 AM)       Right Left   Pressure 20 16         Pupils       Dark Light Shape React APD   Right 5 5 Round NR None   Left 3 2 Round Brisk None         Visual Fields (Counting fingers)       Left Right    Full    Restrictions  Total superior temporal, inferior temporal, superior nasal, inferior nasal deficiencies         Extraocular Movement       Right Left    Full, Ortho Full, Ortho  Neuro/Psych     Oriented x3: Yes   Mood/Affect: Normal         Dilation     Both eyes: 1.0% Mydriacyl, 2.5% Phenylephrine  @ 10:33 AM           Slit Lamp and Fundus Exam     Slit Lamp Exam       Right Left   Lids/Lashes Dermatochalasis, mild MGD Dermatochalasis   Conjunctiva/Sclera White and quiet, temporal pinguecula White and quiet, nasal and temporal pinguecula   Cornea 1-2+ fine PEE, well healed  cataract wound 1+ fine PEE, well healed cataract woud   Anterior Chamber Deep, 2+ fine cell and pigment, ?micro hyphema Deep and clear   Iris Irregular dilation, +iridodenysis, scattered TIDs greatest 200 Round and dilated   Lens PC IOL in good position, ?pseudophakodenisis PC IOL w/ mild temporal displacement   Anterior Vitreous Vitreous syneresis, +RBC, diffuse VH, pre retinal heme settled inferiorly Vitreous syneresis, Posterior vitreous detachment         Fundus Exam       Right Left   Disc Hazy view, Pink and Sharp Pink and Sharp, +cupping   C/D Ratio 0.4 0.7   Macula Very hazy view, grossly attached Flat, blunted foveal reflex, cenral drusen, central PED, +CNV w/ punctate heme, +pigment clumping   Vessels Attenuated, Tortuous Attenuated, mild tortuosity   Periphery Very hazy view, grossly attached, laser scars visible superiorly, pre retinal heme settled inferiorly Attached, reticular degeneration, mild pavingstone inferiorly, no heme           Refraction     Wearing Rx       Sphere   Right +0.00   Left +0.00           IMAGING AND PROCEDURES  Imaging and Procedures for 11/24/2023  OCT, Retina - OU - Both Eyes       Right Eye Quality was good. Progression has no prior data.   Left Eye Quality was good. Progression has no prior data.   Notes *Images captured and stored on drive  Diagnosis / Impression:    Clinical management:  See below  Abbreviations: NFP - Normal foveal profile. CME - cystoid macular edema. PED - pigment epithelial detachment. IRF - intraretinal fluid. SRF - subretinal fluid. EZ - ellipsoid zone. ERM - epiretinal membrane. ORA - outer retinal atrophy. ORT - outer retinal tubulation. SRHM - subretinal hyper-reflective material. IRHM - intraretinal hyper-reflective material           ASSESSMENT/PLAN:   ICD-10-CM   1. Essential hypertension  I10     2. Uveitis-hyphema-glaucoma syndrome of right eye  T85.398A    H20.9    H40.41X0      3. Vitreous hemorrhage of right eye (HCC)  H43.11     4. Exudative age-related macular degeneration of left eye with active choroidal neovascularization (HCC)  H35.3221     5. Hypertensive retinopathy of both eyes  H35.033 OCT, Retina - OU - Both Eyes    6. Pseudophakia of both eyes  Z96.1      **JDM PT last seen on 10.10.25, received bilateral IVA**  1,2. UGH Syndrome/VH OD - VH precautions reviewed -- minimize activities, keep head elevated, avoid ASA/NSAIDs/blood thinners as able - Pt presented on 10.10.25 at appointment w/ Dr. Alvia, vitreous hemorrhage w/ BCVA OD 20/HM. Pt was given IVA OD - BCVA OD today is 20/CF @ 3', pt reports improvement since injection - Exam shows diffuse VH OD, retina is grossly attached -  f/u 1 week, DFE, OCT  3. Exudative age related macular degeneration, OS  - The incidence pathology and anatomy of wet AMD discussed   - discussed treatment options including observation vs intravitreal anti-VEGF agents such as Avastin , Lucentis, Eylea.    - Risks of endophthalmitis and vascular occlusive events and atrophic changes discussed with patient  - s/p IVA OS on 10.10.25 w/ Dr. Alvia  - OCT shows Stable PED w/ no overlying fluid  - f/u  as scheduled w/ Dr. Alvia    4,5. Hypertensive retinopathy OU - discussed importance of tight BP control - monitor   6. Pseudophakia OU  - s/p CE/IOL  - IOL in good position, doing well  - monitor   Ophthalmic Meds Ordered this visit:  No orders of the defined types were placed in this encounter.    No follow-ups on file.  There are no Patient Instructions on file for this visit.  Explained the diagnoses, plan, and follow up with the patient and they expressed understanding.  Patient expressed  understanding of the importance of proper follow up care.    This document serves as a record of services personally performed by Redell JUDITHANN Hans, MD, PhD. It was created on their behalf by Almetta Pesa,  an ophthalmic technician. The creation of this record is the provider's dictation and/or activities during the visit.    Electronically signed by: Almetta Pesa, OA, 11/24/23  12:51 PM    Redell JUDITHANN Hans, M.D., Ph.D. Diseases & Surgery of the Retina and Vitreous [facility] 11/24/2023  Abbreviations: M myopia (nearsighted); A astigmatism; H hyperopia (farsighted); P presbyopia; Mrx spectacle prescription;  CTL contact lenses; OD right eye; OS left eye; OU both eyes  XT exotropia; ET esotropia; PEK punctate epithelial keratitis; PEE punctate epithelial erosions; DES dry eye syndrome; MGD meibomian gland dysfunction; ATs artificial tears; PFAT's preservative free artificial tears; NSC nuclear sclerotic cataract; PSC posterior subcapsular cataract; ERM epi-retinal membrane; PVD posterior vitreous detachment; RD retinal detachment; DM diabetes mellitus; DR diabetic retinopathy; NPDR non-proliferative diabetic retinopathy; PDR proliferative diabetic retinopathy; CSME clinically significant macular edema; DME diabetic macular edema; dbh dot blot hemorrhages; CWS cotton wool spot; POAG primary open angle glaucoma; C/D cup-to-disc ratio; HVF humphrey visual field; GVF goldmann visual field; OCT optical coherence tomography; IOP intraocular pressure; BRVO Branch retinal vein occlusion; CRVO central retinal vein occlusion; CRAO central retinal artery occlusion; BRAO branch retinal artery occlusion; RT retinal tear; SB scleral buckle; PPV pars plana vitrectomy; VH Vitreous hemorrhage; PRP panretinal laser photocoagulation; IVK intravitreal kenalog ; VMT vitreomacular traction; MH Macular hole;  NVD neovascularization of the disc; NVE neovascularization elsewhere; AREDS age related eye disease study; ARMD age related macular degeneration; POAG primary open angle glaucoma; EBMD epithelial/anterior basement membrane dystrophy; ACIOL anterior chamber intraocular lens; IOL intraocular lens;  PCIOL posterior chamber intraocular lens; Phaco/IOL phacoemulsification with intraocular lens placement; PRK photorefractive keratectomy; LASIK laser assisted in situ keratomileusis; HTN hypertension; DM diabetes mellitus; COPD chronic obstructive pulmonary disease

## 2023-11-24 ENCOUNTER — Ambulatory Visit (INDEPENDENT_AMBULATORY_CARE_PROVIDER_SITE_OTHER): Admitting: Ophthalmology

## 2023-11-24 ENCOUNTER — Encounter (INDEPENDENT_AMBULATORY_CARE_PROVIDER_SITE_OTHER): Payer: Self-pay | Admitting: Ophthalmology

## 2023-11-24 DIAGNOSIS — I10 Essential (primary) hypertension: Secondary | ICD-10-CM

## 2023-11-24 DIAGNOSIS — H4041X0 Glaucoma secondary to eye inflammation, right eye, stage unspecified: Secondary | ICD-10-CM

## 2023-11-24 DIAGNOSIS — H35033 Hypertensive retinopathy, bilateral: Secondary | ICD-10-CM

## 2023-11-24 DIAGNOSIS — H353221 Exudative age-related macular degeneration, left eye, with active choroidal neovascularization: Secondary | ICD-10-CM

## 2023-11-24 DIAGNOSIS — H209 Unspecified iridocyclitis: Secondary | ICD-10-CM | POA: Diagnosis not present

## 2023-11-24 DIAGNOSIS — Z961 Presence of intraocular lens: Secondary | ICD-10-CM

## 2023-11-24 DIAGNOSIS — T85398A Other mechanical complication of other ocular prosthetic devices, implants and grafts, initial encounter: Secondary | ICD-10-CM

## 2023-11-24 DIAGNOSIS — H4311 Vitreous hemorrhage, right eye: Secondary | ICD-10-CM

## 2023-11-24 DIAGNOSIS — H3581 Retinal edema: Secondary | ICD-10-CM

## 2023-11-28 ENCOUNTER — Encounter (INDEPENDENT_AMBULATORY_CARE_PROVIDER_SITE_OTHER): Payer: Self-pay | Admitting: Ophthalmology

## 2023-11-29 ENCOUNTER — Telehealth (HOSPITAL_COMMUNITY): Payer: Self-pay

## 2023-11-29 NOTE — Progress Notes (Signed)
 [facility] - Clinic Note  12/01/2023   CHIEF COMPLAINT Patient presents for Retina Follow Up  HISTORY OF PRESENT ILLNESS: Carl [name] is a 78 y.o. 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  This started 2 weeks ago.  Severity is severe.  Duration of 1 week.  I, the attending physician,  performed the HPI with the patient and updated documentation appropriately.        Comments   Pt states he has been using all the drops but they don't seem to be doing any good. Pt states his equilibrium is off and has no depth perception. Pt wanted to know if the blood can be drained from his eye.      Last edited by Valdemar Rogue, MD on 12/07/2023  8:11 PM.    Pt states [facility] seems the same.   Referring physician: Sheldon Netter, PA [address] [place],  [place] [address]  HISTORICAL INFORMATION:  Selected notes from the MEDICAL RECORD NUMBER Referred by Dr. Alvia for [facility] 2/2 UGH syndrome LEE:  Ocular Hx- PMH-   CURRENT MEDICATIONS: Current Outpatient Medications (Ophthalmic Drugs)  Medication Sig   COSOPT 2-0.5 % ophthalmic solution Place 1 drop into the right eye 2 (two) times daily.   No current facility-administered medications for this visit. (Ophthalmic Drugs)   Current Outpatient Medications (Other)  Medication Sig   acetaminophen  (TYLENOL ) 500 MG tablet Take 2 tablets (1,000 mg total) by mouth every 6 (six) hours.   acyclovir  (ZOVIRAX ) 400 MG tablet Take 400 mg by mouth 2 (two) times daily.   apixaban  (ELIQUIS ) 5 MG TABS tablet Take 1 tablet (5 mg total) by mouth 2 (two) times daily.   cetirizine (ZYRTEC) 10 MG tablet Take 10 mg by mouth daily.   citalopram  (CELEXA ) 20 MG tablet Take 20 mg by mouth daily.   clopidogrel  (PLAVIX ) 75 MG tablet Take 1 tablet (75 mg total) by mouth daily.   ezetimibe  (ZETIA ) 10 MG tablet Take 1 tablet (10 mg total) by mouth daily.   FARXIGA 10 MG  TABS tablet Take 10 mg by mouth daily.   ferrous sulfate  325 (65 FE) MG tablet Take 325 mg by mouth daily.   furosemide  (LASIX ) 40 MG tablet TAKE 1 TABLET BY MOUTH EVERY MORNING AND 1/2 TABLET EVERY EVENING   icosapent  Ethyl (VASCEPA ) 1 g capsule TAKE 2 CAPSULES(2 GRAMS) BY MOUTH TWICE DAILY   levothyroxine  (SYNTHROID ) 150 MCG tablet TAKE 1 TABLET(150 MCG) BY MOUTH DAILY (Patient not taking: Reported on 11/24/2023)   lisinopril  (ZESTRIL ) 10 MG tablet Take 1 tablet (10 mg total) by mouth daily.   metFORMIN (GLUCOPHAGE-XR) 500 MG 24 hr tablet Take 500 mg by mouth in the morning and at bedtime.   metoprolol  tartrate (LOPRESSOR ) 25 MG tablet Take 0.5 tablets (12.5 mg total) by mouth 2 (two) times daily.   Misc Natural Products (JOINT HEALTH PO) Take 1 tablet by mouth daily.   Multiple Vitamins-Minerals (PRESERVISION AREDS 2 PO) Take 1 tablet by mouth 2 (two) times daily.   nitroGLYCERIN  (NITROSTAT ) 0.4 MG SL tablet DISSOLVE 1 TABLET UNDER THE TONGUE EVERY 5 MINUTES AS NEEDED FOR CHEST PAIN.   oxyCODONE  (OXY IR/ROXICODONE ) 5 MG immediate release tablet Take 1 tablet (5 mg total) by mouth every 6 (six) hours as needed for severe pain.   OZEMPIC, 1 MG/DOSE, 4 MG/3ML SOPN Inject 1  mg into the skin every Tuesday.   pantoprazole  (PROTONIX ) 20 MG tablet TAKE 1 TABLET(20 MG) BY MOUTH TWICE DAILY   potassium chloride  (KLOR-CON ) 20 MEQ packet Take 20 mEq by mouth daily.   rosuvastatin  (CRESTOR ) 40 MG tablet Take 1 tablet (40 mg total) by mouth daily. Please call [phone] to schedule a November appointment for future refills. Thank you.   zolpidem  (AMBIEN ) 10 MG tablet Take 10 mg by mouth at bedtime.   No current facility-administered medications for this visit. (Other)   REVIEW OF SYSTEMS: ROS   Positive for: Gastrointestinal, Musculoskeletal, Cardiovascular, Eyes Last edited by Elnor Avelina RAMAN, COT on 12/01/2023  2:06 PM.      ALLERGIES No Known Allergies PAST MEDICAL HISTORY Past Medical  History:  Diagnosis Date   Anxiety    takes Xanax  daily as needed   Arthritis    CAD (coronary artery disease) 11/2017   Cath:  p-mRCA 60%, mRCA 50%; dLM-Ost LAD 85%, p-mLAD 75%; p-mLCx 100% after OM1 w/ ost 80% & OM2 w/ ost 80% & prox 90% => s/p CABG x 4 (October 2019-DrSABRA Jude): LIMA-LAD, SVG-OM1-dCx, SVG-rPDA   Cataract    removed both eyes   Chronic diastolic CHF (congestive heart failure) (HCC) 01/2019   Normal LVEF of 65 to 70%.  Indeterminate diastolic parameters.  (Admitted September 2022)   Depression    takes Celexa  daily   Diverticulitis    Diverticulosis    Enlarged prostate    sightly   Esophagitis    Gastric ulcer    GERD (gastroesophageal reflux disease)    takes Protonix  daily   History of TIA (transient ischemic attack)    Hx of adenomatous colonic polyps    benign   Hyperlipidemia    Takes rosuvastatin  40 mg daily, Zetia  10 mg daily and Vascepa  2 g twice daily   Hypertension    Hypothyroidism    Insomnia    takes Ambien  nightly   Joint pain    Joint swelling    OSA (obstructive sleep apnea)    Paroxysmal atrial fibrillation (HCC)    On Eliquis    Pre-diabetes    Past Surgical History:  Procedure Laterality Date   A-FLUTTER ABLATION N/A 01/11/2019   Procedure: A-FLUTTER ABLATION;  Surgeon: Waddell Danelle ORN, MD;  Location: [facility] INVASIVE CV LAB;  Service: Cardiovascular;  Laterality: N/A;   CARDIOVERSION N/A 11/20/2018   Procedure: CARDIOVERSION;  Surgeon: Burnard Debby LABOR, MD;  Location: [facility] ENDOSCOPY;  Service: Cardiovascular;  Laterality: N/A;   cartliage removed from nose  25yrs ago   cataract surgery Bilateral    CERVICAL SPINE SURGERY     COLONOSCOPY     CORONARY ARTERY BYPASS GRAFT N/A 11/16/2017   Procedure: CORONARY ARTERY BYPASS GRAFTING (CABG) x4. LEFT ENDOSCOPIC SAPHENOUS VEIN HARVEST AND MAMMARY ARTERY TAKE DOWN. LIMA TO LAD, SVG TO PDA, SVG TO DISTAL CIRC & OMI.;  Surgeon: Jude Dallas NOVAK, MD;  Location: [facility] OR;  Service: Open Heart Surgery;   Laterality: N/A;   CORONARY IMAGING/OCT N/A 10/25/2023   Procedure: CORONARY IMAGING/OCT;  Surgeon: Anner Alm ORN, MD;  Location: [facility] INVASIVE CV LAB;  Service: Cardiovascular;  Laterality: N/A;   CORONARY STENT INTERVENTION N/A 10/25/2023   Procedure: CORONARY STENT INTERVENTION;  Surgeon: Anner Alm ORN, MD;  Location: [facility] INVASIVE CV LAB;  Service: Cardiovascular;  Laterality: N/A;   EYE SURGERY     FOREIGN BODY REMOVAL ESOPHAGEAL     KNEE SURGERY Left    couple of times  LEFT HEART CATH AND CORONARY ANGIOGRAPHY N/A 11/15/2017   Procedure: LEFT HEART CATH AND CORONARY ANGIOGRAPHY;  Surgeon: Burnard Debby LABOR, MD;  Location: [facility] INVASIVE CV LAB;  Service: Cardiovascular: Proximal to mid RCA 60%, mid RCA 50%; distal LM-ostial LAD 85% with proximal to mid LAD 75%; proximal mid LCx 100%, ostial OM1 50%, OM2 80% and 90%;   NASAL SINUS SURGERY     NM MYOVIEW  LTD  01/2019   Normal myocardial perfusion imaging study without evidence of ischemia or infarction.  EF estimated 60%.  Abnormal septal motion consistent with postoperative state.  LOW RISK   PENILE PROSTHESIS IMPLANT N/A 03/08/2022   Procedure: INSERTION OF INFLATABLE PENILE IMPLANT;  Surgeon: Lovie Arlyss CROME, MD;  Location: [facility] ORS;  Service: Urology;  Laterality: N/A;  90 MINUTES NEEDED FOR CASE   POLYPECTOMY     RIGHT/LEFT HEART CATH AND CORONARY/GRAFT ANGIOGRAPHY N/A 10/25/2023   Procedure: RIGHT/LEFT HEART CATH AND CORONARY/GRAFT ANGIOGRAPHY;  Surgeon: Anner Alm ORN, MD;  Location: [facility] INVASIVE CV LAB;  Service: Cardiovascular;  Laterality: N/A;   TEE WITHOUT CARDIOVERSION N/A 11/16/2017   Procedure: TRANSESOPHAGEAL ECHOCARDIOGRAM (TEE);  Surgeon: Army Dallas NOVAK, MD;  Location: [facility] OR;  Service: Open Heart Surgery;  Laterality: N/A;   TONSILLECTOMY     TOTAL HIP ARTHROPLASTY Right 04/17/2020   Procedure: RIGHT TOTAL HIP ARTHROPLASTY ANTERIOR APPROACH;  Surgeon: Vernetta Lonni GRADE, MD;  Location: [facility] ORS;  Service: Orthopedics;   Laterality: Right;   TOTAL KNEE ARTHROPLASTY Left 06/17/2014   Procedure: TOTAL KNEE ARTHROPLASTY;  Surgeon: Maude ORN Right, MD;  Location: [facility] OR;  Service: Orthopedics;  Laterality: Left;   TRANSTHORACIC ECHOCARDIOGRAM  07/2021   Normal LV size and function with EF of 65 to 70%.  No RWMA.  Indeterminate diastolic parameters-mildly dilated LA..  Mildly elevated PAP with moderate dilated RA and moderately elevated RAP estimated 15 mmHg..  Mild to moderate TR.  Mild AI with AoV sclerosis but no stenosis.   FAMILY HISTORY Family History  Problem Relation Age of Onset   Colon cancer Paternal Grandfather        questionable   Colon polyps Neg Hx    Esophageal cancer Neg Hx    Rectal cancer Neg Hx    Stomach cancer Neg Hx    SOCIAL HISTORY Social History   Tobacco Use   Smoking status: Never   Smokeless tobacco: Never  Vaping Use   Vaping status: Never Used  Substance Use Topics   Alcohol use: Yes    Alcohol/week: 10.0 standard drinks of alcohol    Types: 10 Standard drinks or equivalent per week    Comment: 2 drinks 5 nights per week   Drug use: No       OPHTHALMIC EXAM:  Base Eye Exam     Visual Acuity (Snellen - Linear)       Right Left   Dist [place] 20/800 20/30 -1   Dist ph [place] NI NI    Correction: Glasses         Tonometry (Tonopen, 2:09 PM)       Right Left   Pressure 23 23         Pupils       Pupils Dark Light Shape React APD   Right PERRL 4 4 Round Minimal None   Left PERRL 3 2 Round Brisk NR         Visual Fields       Left Right    Full  Restrictions  Total superior temporal, inferior temporal, superior nasal, inferior nasal deficiencies         Extraocular Movement       Right Left    Full, Ortho Full, Ortho         Neuro/Psych     Oriented x3: Yes   Mood/Affect: Normal         Dilation     Both eyes: 1.0% Mydriacyl, 2.5% Phenylephrine  @ 2:11 PM           Slit Lamp and Fundus Exam     Slit Lamp Exam        Right Left   Lids/Lashes Dermatochalasis, mild MGD Dermatochalasis   Conjunctiva/Sclera White and quiet, temporal pinguecula White and quiet, nasal and temporal pinguecula   Cornea 1+ fine PEE, well healed cataract wound 1+ fine PEE, well healed cataract woud   Anterior Chamber Deep, 2+ fine cell and pigment, ?micro hyphema Deep and clear   Iris Irregular dilation, +iridoddonesis, scattered TIDs greatest 200 Round and dilated   Lens PC IOL in good position, ?pseudophakodenisis PC IOL w/ mild temporal displacement   Anterior Vitreous Vitreous syneresis, +RBC, diffuse VH, pre retinal heme settled inferiorly Vitreous syneresis, Posterior vitreous detachment         Fundus Exam       Right Left   Disc Trace pallor, sharp rim Pink and Sharp, +cupping   C/D Ratio 0.6 0.7   Macula hazy view, grossly flat Flat, blunted foveal reflex, cenral drusen, central PED, +CNV w/ punctate heme, +pigment clumping   Vessels Attenuated, Tortuous Attenuated, mild tortuosity   Periphery Hazy view improving, superior retina attached, laser scars visible superiorly, inferior retina obscured by VH, pre retinal heme settled inferiorly Attached, reticular degeneration, mild pavingstone inferiorly, no heme           Refraction     Wearing Rx       Sphere Cylinder Axis Add   Right -1.75 +1.50 039 +2.75   Left -0.75 +1.00 132 +2.75    Age: >1 year   Type: Progressive           IMAGING AND PROCEDURES  Imaging and Procedures for 12/01/2023  OCT, Retina - OU - Both Eyes       Right Eye Quality was poor. Central Foveal Thickness: 402. Progression has improved. Findings include (Diffuse vitreous opacities, retina grossly attached, central PEDs, no IRF or SRF. ).   Left Eye Quality was good. Central Foveal Thickness: 379. Progression has been stable. Findings include no IRF, no SRF, abnormal foveal contour, intraretinal hyper-reflective material, choroidal neovascular membrane, pigment epithelial  detachment (Stable PEDs w/ no surrounding fluid).   Notes *Images captured and stored on drive  Diagnosis / Impression:  OD: Diffuse vitreous opacities, retina grossly attached, central PEDs, no IRF or SRF.  OS: abnormal foveal profile w/ central CNV/PED, but no IRF/SRF  Clinical management:  See below  Abbreviations: NFP - Normal foveal profile. CME - cystoid macular edema. PED - pigment epithelial detachment. IRF - intraretinal fluid. SRF - subretinal fluid. EZ - ellipsoid zone. ERM - epiretinal membrane. ORA - outer retinal atrophy. ORT - outer retinal tubulation. SRHM - subretinal hyper-reflective material. IRHM - intraretinal hyper-reflective material           ASSESSMENT/PLAN:   ICD-10-CM   1. Uveitis-hyphema-glaucoma syndrome of right eye  T85.398A OCT, Retina - OU - Both Eyes   H20.9    H40.41X0     2. Vitreous hemorrhage of  right eye (HCC)  H43.11     3. Exudative age-related macular degeneration of left eye with active choroidal neovascularization (HCC)  H35.3221     4. Essential hypertension  I10     5. Hypertensive retinopathy of both eyes  H35.033     6. Pseudophakia of both eyes  Z96.1       **JDM PT last seen on 10.10.25, received bilateral IVA**  1,2. UGH Syndrome/VH OD - Pt presented on 10.10.25 at appointment w/ Dr. Alvia, vitreous hemorrhage w/ BCVA OD 20/HM. Pt was given IVA OD - OCT OD shows Diffuse vitreous opacities, retina grossly attached, central PEDs, no IRF or SRF. - Drops currently taking - Atropine once daily OD - Brim and Timolol BID OD - Pred QID OD - BCVA OD today is improved to 20/800 from 20/CF @ 3' - Exam shows diffuse VH slightly improved, retina is grossly attached - VH precautions reviewed -- minimize activities, keep head elevated, avoid ASA/NSAIDs/blood thinners as able - f/u 11.7.25 , likely IVA OD , DFE, OCT  3. Exudative age related macular degeneration, OS  - The incidence pathology and anatomy of wet AMD discussed    - discussed treatment options including observation vs intravitreal anti-VEGF agents such as Avastin , Lucentis, Eylea.    - Risks of endophthalmitis and vascular occlusive events and atrophic changes discussed with patient  - s/p IVA OS on 10.10.25 w/ Dr. Alvia  - OCT shows Stable PED w/ no overlying fluid  - f/u 11.7.25   4,5. Hypertensive retinopathy OU - discussed importance of tight BP control - monitor   6. Pseudophakia OU  - s/p CE/IOL  - IOL in good position, doing well  - monitor   Ophthalmic Meds Ordered this visit:  No orders of the defined types were placed in this encounter.    Return in about 2 weeks (around 12/15/2023) for VH OD, DFE, OCT, likely inj OD.  There are no Patient Instructions on file for this visit.  This document serves as a record of services personally performed by Redell JUDITHANN Hans, MD, PhD. It was created on their behalf by Delon Newness COT, an ophthalmic technician. The creation of this record is the provider's dictation and/or activities during the visit.    Electronically signed by: Delon Newness COT 10.22.25  8:12 PM   This document serves as a record of services personally performed by Redell JUDITHANN Hans, MD, PhD. It was created on their behalf by Almetta Pesa, an ophthalmic technician. The creation of this record is the provider's dictation and/or activities during the visit.    Electronically signed by: Almetta Pesa, OA, 12/07/23  8:12 PM  Redell JUDITHANN Hans, M.D., Ph.D. Diseases & Surgery of the Retina and Vitreous [facility] 12/01/2023  I have reviewed the above documentation for accuracy and completeness, and I agree with the above. Redell JUDITHANN Hans, M.D., Ph.D. 12/07/23 8:13 PM   Abbreviations: M myopia (nearsighted); A astigmatism; H hyperopia (farsighted); P presbyopia; Mrx spectacle prescription;  CTL contact lenses; OD right eye; OS left eye; OU both eyes  XT exotropia; ET esotropia; PEK  punctate epithelial keratitis; PEE punctate epithelial erosions; DES dry eye syndrome; MGD meibomian gland dysfunction; ATs artificial tears; PFAT's preservative free artificial tears; NSC nuclear sclerotic cataract; PSC posterior subcapsular cataract; ERM epi-retinal membrane; PVD posterior vitreous detachment; RD retinal detachment; DM diabetes mellitus; DR diabetic retinopathy; NPDR non-proliferative diabetic retinopathy; PDR proliferative diabetic retinopathy; CSME clinically significant macular edema; DME diabetic macular  edema; dbh dot blot hemorrhages; CWS cotton wool spot; POAG primary open angle glaucoma; C/D cup-to-disc ratio; HVF humphrey visual field; GVF goldmann visual field; OCT optical coherence tomography; IOP intraocular pressure; BRVO Branch retinal vein occlusion; CRVO central retinal vein occlusion; CRAO central retinal artery occlusion; BRAO branch retinal artery occlusion; RT retinal tear; SB scleral buckle; PPV pars plana vitrectomy; VH Vitreous hemorrhage; PRP panretinal laser photocoagulation; IVK intravitreal kenalog ; VMT vitreomacular traction; MH Macular hole;  NVD neovascularization of the disc; NVE neovascularization elsewhere; AREDS age related eye disease study; ARMD age related macular degeneration; POAG primary open angle glaucoma; EBMD epithelial/anterior basement membrane dystrophy; ACIOL anterior chamber intraocular lens; IOL intraocular lens; PCIOL posterior chamber intraocular lens; Phaco/IOL phacoemulsification with intraocular lens placement; PRK photorefractive keratectomy; LASIK laser assisted in situ keratomileusis; HTN hypertension; DM diabetes mellitus; COPD chronic obstructive pulmonary disease

## 2023-11-29 NOTE — Telephone Encounter (Signed)
 Patient called back to get scheduled in the Cardiac Rehab Program. Patient will come in for orientation on 11/19 and will attend the 1:45 exercise class.  Pensions consultant.

## 2023-11-29 NOTE — Telephone Encounter (Signed)
 Attempted f/u call regarding cardiac rehab- no answer, left message. Patient read previous MyChart message sent on 10/15 and has not responded.  Closing referral.

## 2023-12-01 ENCOUNTER — Ambulatory Visit (INDEPENDENT_AMBULATORY_CARE_PROVIDER_SITE_OTHER): Admitting: Ophthalmology

## 2023-12-01 ENCOUNTER — Encounter (INDEPENDENT_AMBULATORY_CARE_PROVIDER_SITE_OTHER): Payer: Self-pay | Admitting: Ophthalmology

## 2023-12-01 DIAGNOSIS — H35033 Hypertensive retinopathy, bilateral: Secondary | ICD-10-CM | POA: Diagnosis not present

## 2023-12-01 DIAGNOSIS — H4311 Vitreous hemorrhage, right eye: Secondary | ICD-10-CM

## 2023-12-01 DIAGNOSIS — T85398A Other mechanical complication of other ocular prosthetic devices, implants and grafts, initial encounter: Secondary | ICD-10-CM

## 2023-12-01 DIAGNOSIS — I10 Essential (primary) hypertension: Secondary | ICD-10-CM | POA: Diagnosis not present

## 2023-12-01 DIAGNOSIS — H209 Unspecified iridocyclitis: Secondary | ICD-10-CM

## 2023-12-01 DIAGNOSIS — Z961 Presence of intraocular lens: Secondary | ICD-10-CM

## 2023-12-01 DIAGNOSIS — H4041X0 Glaucoma secondary to eye inflammation, right eye, stage unspecified: Secondary | ICD-10-CM | POA: Diagnosis not present

## 2023-12-01 DIAGNOSIS — H353221 Exudative age-related macular degeneration, left eye, with active choroidal neovascularization: Secondary | ICD-10-CM | POA: Diagnosis not present

## 2023-12-06 DIAGNOSIS — T8579XS Infection and inflammatory reaction due to other internal prosthetic devices, implants and grafts, sequela: Secondary | ICD-10-CM | POA: Diagnosis not present

## 2023-12-07 ENCOUNTER — Encounter (INDEPENDENT_AMBULATORY_CARE_PROVIDER_SITE_OTHER): Payer: Self-pay | Admitting: Ophthalmology

## 2023-12-08 DIAGNOSIS — E1159 Type 2 diabetes mellitus with other circulatory complications: Secondary | ICD-10-CM | POA: Diagnosis not present

## 2023-12-08 DIAGNOSIS — E785 Hyperlipidemia, unspecified: Secondary | ICD-10-CM | POA: Diagnosis not present

## 2023-12-08 DIAGNOSIS — E669 Obesity, unspecified: Secondary | ICD-10-CM | POA: Diagnosis not present

## 2023-12-08 DIAGNOSIS — I480 Paroxysmal atrial fibrillation: Secondary | ICD-10-CM | POA: Diagnosis not present

## 2023-12-11 ENCOUNTER — Other Ambulatory Visit: Payer: Self-pay | Admitting: Internal Medicine

## 2023-12-13 NOTE — Progress Notes (Shared)
 [facility] - Clinic Note  12/15/2023   CHIEF COMPLAINT Patient presents for No chief complaint on file.  HISTORY OF PRESENT ILLNESS: Carl [name] is a 78 y.o. male who presents to the clinic today for:   Pt states VA seems the same.   Referring physician: Sheldon Netter, PA [address] [place],  [place] [address]  HISTORICAL INFORMATION:  Selected notes from the MEDICAL RECORD NUMBER Referred by Dr. Alvia for [facility] 2/2 UGH syndrome LEE:  Ocular Hx- PMH-   CURRENT MEDICATIONS: Current Outpatient Medications (Ophthalmic Drugs)  Medication Sig   COSOPT 2-0.5 % ophthalmic solution Place 1 drop into the right eye 2 (two) times daily.   No current facility-administered medications for this visit. (Ophthalmic Drugs)   Current Outpatient Medications (Other)  Medication Sig   acetaminophen  (TYLENOL ) 500 MG tablet Take 2 tablets (1,000 mg total) by mouth every 6 (six) hours.   acyclovir  (ZOVIRAX ) 400 MG tablet Take 400 mg by mouth 2 (two) times daily.   apixaban  (ELIQUIS ) 5 MG TABS tablet Take 1 tablet (5 mg total) by mouth 2 (two) times daily.   cetirizine (ZYRTEC) 10 MG tablet Take 10 mg by mouth daily.   citalopram  (CELEXA ) 20 MG tablet Take 20 mg by mouth daily.   clopidogrel  (PLAVIX ) 75 MG tablet Take 1 tablet (75 mg total) by mouth daily.   ezetimibe  (ZETIA ) 10 MG tablet Take 1 tablet (10 mg total) by mouth daily.   FARXIGA 10 MG TABS tablet Take 10 mg by mouth daily.   ferrous sulfate  325 (65 FE) MG tablet Take 325 mg by mouth daily.   furosemide  (LASIX ) 40 MG tablet TAKE 1 TABLET BY MOUTH EVERY MORNING AND 1/2 TABLET EVERY EVENING   icosapent  Ethyl (VASCEPA ) 1 g capsule TAKE 2 CAPSULES(2 GRAMS) BY MOUTH TWICE DAILY   levothyroxine  (SYNTHROID ) 150 MCG tablet TAKE 1 TABLET(150 MCG) BY MOUTH DAILY (Patient not taking: Reported on 11/24/2023)   lisinopril  (ZESTRIL ) 10 MG tablet Take 1 tablet (10 mg total) by mouth daily.   metFORMIN (GLUCOPHAGE-XR) 500  MG 24 hr tablet Take 500 mg by mouth in the morning and at bedtime.   metoprolol  tartrate (LOPRESSOR ) 25 MG tablet Take 0.5 tablets (12.5 mg total) by mouth 2 (two) times daily.   Misc Natural Products (JOINT HEALTH PO) Take 1 tablet by mouth daily.   Multiple Vitamins-Minerals (PRESERVISION AREDS 2 PO) Take 1 tablet by mouth 2 (two) times daily.   nitroGLYCERIN  (NITROSTAT ) 0.4 MG SL tablet DISSOLVE 1 TABLET UNDER THE TONGUE EVERY 5 MINUTES AS NEEDED FOR CHEST PAIN.   oxyCODONE  (OXY IR/ROXICODONE ) 5 MG immediate release tablet Take 1 tablet (5 mg total) by mouth every 6 (six) hours as needed for severe pain.   OZEMPIC, 1 MG/DOSE, 4 MG/3ML SOPN Inject 1 mg into the skin every Tuesday.   pantoprazole  (PROTONIX ) 20 MG tablet Take 1 tablet (20 mg total) by mouth 2 (two) times daily. Office visit for further refills   potassium chloride  (KLOR-CON ) 20 MEQ packet Take 20 mEq by mouth daily.   rosuvastatin  (CRESTOR ) 40 MG tablet Take 1 tablet (40 mg total) by mouth daily. Please call [phone] to schedule a November appointment for future refills. Thank you.   zolpidem  (AMBIEN ) 10 MG tablet Take 10 mg by mouth at bedtime.   No current facility-administered medications for this visit. (Other)   REVIEW OF SYSTEMS:    ALLERGIES No Known Allergies PAST MEDICAL HISTORY Past Medical History:  Diagnosis Date   Anxiety    takes Xanax  daily as needed   Arthritis    CAD (coronary artery disease) 11/2017   Cath:  p-mRCA 60%, mRCA 50%; dLM-Ost LAD 85%, p-mLAD 75%; p-mLCx 100% after OM1 w/ ost 80% & OM2 w/ ost 80% & prox 90% => s/p CABG x 4 (October 2019-Dr. Gerhardt): LIMA-LAD, SVG-OM1-dCx, SVG-rPDA   Cataract    removed both eyes   Chronic diastolic CHF (congestive heart failure) (HCC) 01/2019   Normal LVEF of 65 to 70%.  Indeterminate diastolic parameters.  (Admitted September 2022)   Depression    takes Celexa  daily   Diverticulitis    Diverticulosis    Enlarged prostate    sightly    Esophagitis    Gastric ulcer    GERD (gastroesophageal reflux disease)    takes Protonix  daily   History of TIA (transient ischemic attack)    Hx of adenomatous colonic polyps    benign   Hyperlipidemia    Takes rosuvastatin  40 mg daily, Zetia  10 mg daily and Vascepa  2 g twice daily   Hypertension    Hypothyroidism    Insomnia    takes Ambien  nightly   Joint pain    Joint swelling    OSA (obstructive sleep apnea)    Paroxysmal atrial fibrillation (HCC)    On Eliquis    Pre-diabetes    Past Surgical History:  Procedure Laterality Date   A-FLUTTER ABLATION N/A 01/11/2019   Procedure: A-FLUTTER ABLATION;  Surgeon: Waddell Danelle ORN, MD;  Location: [facility] INVASIVE CV LAB;  Service: Cardiovascular;  Laterality: N/A;   CARDIOVERSION N/A 11/20/2018   Procedure: CARDIOVERSION;  Surgeon: Burnard Debby LABOR, MD;  Location: [facility] ENDOSCOPY;  Service: Cardiovascular;  Laterality: N/A;   cartliage removed from nose  75yrs ago   cataract surgery Bilateral    CERVICAL SPINE SURGERY     COLONOSCOPY     CORONARY ARTERY BYPASS GRAFT N/A 11/16/2017   Procedure: CORONARY ARTERY BYPASS GRAFTING (CABG) x4. LEFT ENDOSCOPIC SAPHENOUS VEIN HARVEST AND MAMMARY ARTERY TAKE DOWN. LIMA TO LAD, SVG TO PDA, SVG TO DISTAL CIRC & OMI.;  Surgeon: Army Dallas NOVAK, MD;  Location: [facility] OR;  Service: Open Heart Surgery;  Laterality: N/A;   CORONARY IMAGING/OCT N/A 10/25/2023   Procedure: CORONARY IMAGING/OCT;  Surgeon: Anner Alm ORN, MD;  [facility] INVASIVE CV LAB;  Service: Cardiovascular;  Laterality: N/A;   CORONARY STENT INTERVENTION N/A 10/25/2023   Procedure: CORONARY STENT INTERVENTION;  Surgeon: Anner Alm ORN, MD;  Location: [facility] INVASIVE CV LAB;  Service: Cardiovascular;  Laterality: N/A;   EYE SURGERY     FOREIGN BODY REMOVAL ESOPHAGEAL     KNEE SURGERY Left    couple of times   LEFT HEART CATH AND CORONARY ANGIOGRAPHY N/A 11/15/2017   Procedure: LEFT HEART CATH AND CORONARY ANGIOGRAPHY;  Surgeon: Burnard Debby LABOR, MD;  Location: [facility] INVASIVE CV LAB;  Service: Cardiovascular: Proximal to mid RCA 60%, mid RCA 50%; distal LM-ostial LAD 85% with proximal to mid LAD 75%; proximal mid LCx 100%, ostial OM1 50%, OM2 80% and 90%;   NASAL SINUS SURGERY     NM MYOVIEW  LTD  01/2019   Normal myocardial perfusion imaging study without evidence of ischemia or infarction.  EF estimated 60%.  Abnormal septal motion consistent with postoperative state.  LOW RISK   PENILE PROSTHESIS IMPLANT N/A 03/08/2022   Procedure: INSERTION OF INFLATABLE PENILE IMPLANT;  Surgeon: Lovie Arlyss CROME, MD;  Location: [facility] ORS;  Service:  Urology;  Laterality: N/A;  90 MINUTES NEEDED FOR CASE   POLYPECTOMY     RIGHT/LEFT HEART CATH AND CORONARY/GRAFT ANGIOGRAPHY N/A 10/25/2023   Procedure: RIGHT/LEFT HEART CATH AND CORONARY/GRAFT ANGIOGRAPHY;  Surgeon: Anner Alm ORN, MD;  Location[facility] INVASIVE CV LAB;  Service: Cardiovascular;  Laterality: N/A;   TEE WITHOUT CARDIOVERSION N/A 11/16/2017   Procedure: TRANSESOPHAGEAL ECHOCARDIOGRAM (TEE);  Surgeon: Army Dallas NOVAK, MD;  Location: [facility] OR;  Service: Open Heart Surgery;  Laterality: N/A;   TONSILLECTOMY     TOTAL HIP ARTHROPLASTY Right 04/17/2020   Procedure: RIGHT TOTAL HIP ARTHROPLASTY ANTERIOR APPROACH;  Surgeon: Vernetta Lonni GRADE, MD;  Location: [facility] ORS;  Service: Orthopedics;  Laterality: Right;   TOTAL KNEE ARTHROPLASTY Left 06/17/2014   Procedure: TOTAL KNEE ARTHROPLASTY;  Surgeon: Maude ORN Right, MD;  Location: [facility] OR;  Service: Orthopedics;  Laterality: Left;   TRANSTHORACIC ECHOCARDIOGRAM  07/2021   Normal LV size and function with EF of 65 to 70%.  No RWMA.  Indeterminate diastolic parameters-mildly dilated LA..  Mildly elevated PAP with moderate dilated RA and moderately elevated RAP estimated 15 mmHg..  Mild to moderate TR.  Mild AI with AoV sclerosis but no stenosis.   FAMILY HISTORY Family History  Problem Relation Age of Onset   Colon cancer Paternal Grandfather         questionable   Colon polyps Neg Hx    Esophageal cancer Neg Hx    Rectal cancer Neg Hx    Stomach cancer Neg Hx    SOCIAL HISTORY Social History   Tobacco Use   Smoking status: Never   Smokeless tobacco: Never  Vaping Use   Vaping status: Never Used  Substance Use Topics   Alcohol use: Yes    Alcohol/week: 10.0 standard drinks of alcohol    Types: 10 Standard drinks or equivalent per week    Comment: 2 drinks 5 nights per week   Drug use: No       OPHTHALMIC EXAM:  Not recorded    IMAGING AND PROCEDURES  Imaging and Procedures for 12/15/2023         ASSESSMENT/PLAN: No diagnosis found.   **JDM PT last seen on 10.10.25, received bilateral IVA**  1,2. UGH Syndrome/VH OD - Pt presented on 10.10.25 at appointment w/ Dr. Alvia, vitreous hemorrhage w/ BCVA OD 20/HM. Pt was given IVA OD - OCT OD shows Diffuse vitreous opacities, retina grossly attached, central PEDs, no IRF or SRF. 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    - Risks of endophthalmitis and vascular occlusive events and atrophic changes discussed with patient  - s/p IVA OS on 10.10.25 w/ Dr. Alvia  - OCT shows Stable PED w/ no overlying fluid  - f/u 11.7.25   4,5. Hypertensive retinopathy OU - discussed importance of tight BP control - monitor   6. Pseudophakia OU  - s/p CE/IOL  - IOL in good position, doing well  -  monitor    Ophthalmic Meds Ordered this visit:  No orders of the defined types were placed in this encounter.    No follow-ups on file.  There are no Patient Instructions on file for this visit.  This document serves as a record of services personally performed by Redell JUDITHANN Hans, MD, PhD. It was created on their behalf by Delon Newness COT, an ophthalmic technician. The creation of this record is the provider's dictation and/or activities during the visit.    Electronically signed by: Delon Newness COT 11.05.25 8:08 AM  Abbreviations: M myopia (nearsighted); A astigmatism; H hyperopia (farsighted); P presbyopia; Mrx spectacle prescription;  CTL contact lenses; OD right eye; OS left eye; OU both eyes  XT exotropia; ET esotropia; PEK punctate epithelial keratitis; PEE punctate epithelial erosions; DES dry eye syndrome; MGD meibomian gland dysfunction; ATs artificial tears; PFAT's preservative free artificial tears; NSC nuclear sclerotic cataract; PSC posterior subcapsular cataract; ERM epi-retinal membrane; PVD posterior vitreous detachment; RD retinal detachment; DM diabetes mellitus; DR diabetic retinopathy; NPDR non-proliferative diabetic retinopathy; PDR proliferative diabetic retinopathy; CSME clinically significant macular edema; DME diabetic macular edema; dbh dot blot hemorrhages; CWS cotton wool spot; POAG primary open angle glaucoma; C/D cup-to-disc ratio; HVF humphrey visual field; GVF goldmann visual field; OCT optical coherence tomography; IOP intraocular pressure; BRVO Branch retinal vein occlusion; CRVO central retinal vein occlusion; CRAO central retinal artery occlusion; BRAO branch retinal artery occlusion; RT retinal tear; SB scleral buckle; PPV pars plana vitrectomy; VH Vitreous hemorrhage; PRP panretinal laser photocoagulation; IVK intravitreal kenalog ; VMT vitreomacular traction; MH Macular hole;  NVD neovascularization of the disc; NVE neovascularization elsewhere; AREDS age  related eye disease study; ARMD age related macular degeneration; POAG primary open angle glaucoma; EBMD epithelial/anterior basement membrane dystrophy; ACIOL anterior chamber intraocular lens; IOL intraocular lens; PCIOL posterior chamber intraocular lens; Phaco/IOL phacoemulsification with intraocular lens placement; PRK photorefractive keratectomy; LASIK laser assisted in situ keratomileusis; HTN hypertension; DM diabetes mellitus; COPD chronic obstructive pulmonary disease

## 2023-12-13 NOTE — Progress Notes (Deleted)
 Cardiology Office Note    Date:  12/13/2023  ID:  Carl [name], DOB 1945-05-17, MRN [id number] PCP:  Sheldon Netter, PA  Cardiologist:  Alm Clay, MD  Electrophysiologist:  None   Chief Complaint: ***  History of Present Illness: Carl [name]    Carl [name] is a 78 y.o. male with visit-pertinent history of  typical atrial flutter, paroxysmal atrial fibrillation, chronic anticoagulation with Eliquis , CAD s/p CABG x 4 with LIMA to LAD, SVG to OM, SVG to diagonal circumflex, SVG to PDA in 2019, chronic diastolic heart failure, hypertension, hyperlipidemia, hypothyroidism and OSA.  Patient was previously followed by Dr. Burnard, has now transitioned care to Dr. Clay.   Patient's amiodarone  previously discontinued due to elevated LFTs and elevated TSH level. Patient underwent DCCV in 11/2018 however continued to have episodes of atrial flutter.  Patient underwent atrial flutter ablation with Dr. Waddell in December 2020.   Patient presented to the emergency room on 08/30/2023 with concerns regarding atrial fibrillation but left after not being seen by the MD.  His EKG showed A-fib with a rate of 117 bpm with aberrantly conducted beats versus PVCs.   Patient was seen by Dr. Clay on 10/17/2023 for progressive shortness of breath.  Patient reported that he noted since summer initially attributing it to the hot weather however would come completely out of breath after walking short distances.  He denies chest pain but reported very mild occasional indigestion-like symptoms when walking.  Cardiac catheterization was recommended.   On 10/25/2023 patient underwent cardiac catheterization, noted to have patent LIMA to LAD, sequential SVG to OM1-OM3 with atretic SVG to RPDA.  Patient had successful PCI/DES x 1 covering 3 lesions in the RCA with plan for triple therapy with aspirin , Plavix  and Eliquis  for 2 weeks then stopping aspirin  with continuation of Plavix  and Eliquis  for at least 6 months.  Is recommended that  patient take his Lasix  daily, noted to have a rate related left bundle branch block in the Cath Lab with recommendation to reduce beta-blocker dose in order to increase afterload reduction in outpatient setting.   On 11/01/2023 patient reported that he was having increased fatigue and shortness of breath, reported that he had not been taking any Lasix .  Patient was instructed to start Lasix  40 mg in the morning and 20 in the evening daily.  Patient was last in clinic on 11/08/2023 for follow-up.  He reportedly been doing well, denies chest pain.  He reported that following his catheterization he did have some worsening shortness of breath, reported he was not taking his Lasix , after resuming Lasix  he reported shortness breath at complete resolved.  He denied any increased lower extremity edema, thought or PND.  He denied any palpitations, racing presyncope.  He reported that he is getting back to his regular activity and was feeling about a centimeter significantly long time.  Patient noted multiple medications he was unsure if he was taking, requested that he notify the office following his office visit, does not appear this was completed.  On chart review patient presented to [facility] the ED via EMS after sliding off of a chair at home and landing on the floor, per notes he was unable to get himself up, his wife called EMS in the morning.  Per notes from ED workup overall reassuring and patient was ambulatory in the ED without issue.  Today he presents for follow-up.  He reports that he   CAD: s/p CABG x 4 in 2019.  Patient with nuclear stress in 2020 that was reassuring.  Patient underwent cardiac catheterization in setting of progressive dyspnea on 10/25/2023 with Dr. Anner, noted to have patent LIMA to LAD, sequential SVG to OM1-OM3 with atretic SVG to RPDA.  Patient had successful PCI/DES x 1 covering 3 lesions in the RCA with plan for triple therapy with aspirin , Plavix  and Eliquis  for 2 weeks then  stopping aspirin  with continuation of Plavix  and Eliquis  for at least 6 months.  Today he reports that he  Reviewed ED precautions. Continue Plavix  75 mg daily and Eliquis  5 mg twice daily.  Chronic diastolic heart failure: Echocardiogram on 08/02/2021 indicated LVEF 65 to 70%, no RWMA, diastolic parameters were indeterminate, RV systolic function and size was normal, mildly elevated pulmonary artery systolic pressures, LA was mildly dilated, RA was moderately dilated, mild mitral valve regurgitation.  Echo on  PAF/atrial flutter: S/p flutter ablation.  Patient previously intolerant to amiodarone  due to elevated LFTs and TSH.   Today he reports that he   Hypertension: Blood pressure today  Continue   Hyperlipidemia:   OSA:   Labwork independently reviewed:   ROS: .   *** denies chest pain, shortness of breath, lower extremity edema, fatigue, palpitations, melena, hematuria, hemoptysis, diaphoresis, weakness, presyncope, syncope, orthopnea, and PND.  All other systems are reviewed and otherwise negative.  Studies Reviewed: Carl [name]    EKG:  EKG is ordered today, personally reviewed, demonstrating ***     CV Studies: Cardiac studies reviewed are outlined and summarized above. Otherwise please see EMR for full report. Cardiac Studies & Procedures   ______________________________________________________________________________________________ CARDIAC CATHETERIZATION  CARDIAC CATHETERIZATION 10/25/2023  Conclusion Table formatting from the original result was not included. Images from the original result were not included.    CULPRIT LESION: Prox RCA to Mid RCA lesion is 70% stenosed.  Mid RCA-1 lesion is 80% stenosed. Mid RCA-2 lesion is 60% stenosed.  (SVG to RPDA is atretic)   A drug-eluting stent was successfully placed using a STENT SYNERGY XD 3.0X32.  The proximal 28 mm postdilated to 4.1 mm with the distal segment postdilated 3.5 mm.  Post intervention, there is a 0% residual  stenosis.  TIMI-3 flow maintained   A 2ND stent was successfully placed overlapping the stent distally to ensure coverage of the final 60% stenosis, using a STENT SYNERGY XD 3.0X8.  This was postdilated at high atmospheres to 3.6 mm. Post intervention, there is a 0% residual stenosis.  TIMI-3 flow maintained   -------------------------------------------------------   Dist LM to Ost LAD lesion is 70% stenosed.  Prox LAD to Mid LAD lesion is 75% stenosed.   Ost 1st Mrg to 1st Mrg lesion is 75% stenosed.  Small caliber 2nd Mrg lesion is 80% stenosed. Ost 2nd Mrg lesion is 70% stenosed. Dist Cx lesion is 100% stenosed.   -----------------3/4 Patent GRAFTS-----------------------------------   LIMA-dLAD graft was visualized by angiography and is normal in caliber.  The graft exhibits no disease. There is competitive flow.   Seq SVG- SVG-OM1-OM3 graft was visualized by angiography and is large.  The graft exhibits no disease.  The flow is not reversed.  There is no competitive flow   SVG-rPDA graft was visualized by angiography and is small.  The graft exhibits severe diffuse disease.  Origin lesion is 90% stenosed. Prox Graft to Insertion lesion is 60% stenosed.   ----------------------- HEMODYNAMICS ---------------------------   The left ventricular systolic function is normal.  The left ventricular ejection fraction is greater than 65% by visual  estimate.  There is no aortic valve stenosis.   Hemodynamic findings consistent with mild pulmonary hypertension.  Mean PAP 25 mL of mercury with LVEDP of 24 mmHg and PCWP of 22 mm..   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   Normal Fick Cardiac Output and Index: 6.48-3.08  Diagnostic Dominance: Right     Intervention   RECOMMENDATIONS   In the absence of any other complications or medical issues, we expect the  patient to be ready for discharge from an interventional cardiology perspective on 10/25/2023.   Ensure that the patient is taking his Lasix  daily; With rate related left bundle branch block in the Cath Lab, will likely reduce beta-blocker dose in order to increase afterload reduction.  This can be done in the outpatient setting.   Recommend to resume Apixaban , at currently prescribed dose and frequency on 10/25/2023.   Recommend concurrent antiplatelet therapy of Aspirin  81 mg for 1 month and Clopidogrel  75mg  daily for 6 months .   Will actually stop aspirin  at 2 weeks, and continue Plavix  for 6 months along with Eliquis .3    Alm Clay, MD  Findings Coronary Findings Diagnostic  Dominance: Right  Left Main Dist LM to Ost LAD lesion is 70% stenosed.  Left Anterior Descending There is mild diffuse disease throughout the vessel. Prox LAD to Mid LAD lesion is 75% stenosed.  Left Circumflex Dist Cx lesion is 100% stenosed.  First Obtuse Marginal Branch Ost 1st Mrg to 1st Mrg lesion is 75% stenosed.  Second Obtuse Marginal Branch Ost 2nd Mrg lesion is 70% stenosed. 2nd Mrg lesion is 80% stenosed.  Third Obtuse Marginal Branch Vessel is large in size.  Left Posterior Atrioventricular Artery Vessel is small in size.  Right Coronary Artery Prox RCA to Mid RCA lesion is 70% stenosed. The lesion is type B2, segmental and eccentric. The lesion is mildly calcified. Optical coherence tomography (OCT) was performed. Minimum lumen area: 1.81 mm. Minimum stent area: 9.07 mm. Mid RCA-1 lesion is 80% stenosed. Mid RCA-2 lesion is 60% stenosed.  Acute Marginal Branch Vessel is small in size.  LIMA LIMA Graft To Mid LAD LIMA graft was visualized by angiography and is normal in caliber.  The graft exhibits no disease. There is competitive flow.  Sequential Jump Graft Graft To 1st Mrg, 3rd Mrg Seq SVG- SVG-OM1-OM3 graft was visualized by angiography and is large.  The graft exhibits no  disease. The flow is not reversed. There is no competitive flow  Saphenous Graft To Ost RPDA SVG graft was visualized by angiography and is small.  The graft exhibits severe diffuse disease. The graft is atretic. Origin lesion is 90% stenosed. Prox Graft to Insertion lesion is 60% stenosed.  Intervention  Prox RCA to Mid RCA lesion Stent (Also treats lesions: Mid RCA-1) Lesion length:  34 mm. CATH VISTA GUIDE 6FR JR4 ECOPK guide catheter was inserted. Lesion crossed with guidewire using a WIRE ASAHI PROWATER 180CM. Pre-stent angioplasty was not performed. A drug-eluting stent was successfully placed using a STENT SYNERGY XD 3.0X32. Maximum pressure: 16 atm. Inflation time: 30 sec. Stent strut is well apposed. Post-stent angioplasty was performed using a BALLOON SAPPHIRE NC24 4.0X18. Maximum pressure:  20 atm. Inflation time:  20 sec. 2 inflations to cover the proximal 28 mm of the stent During present appointment, the patient took a  deep breath in the catheter pullback bringing the stent back proximally roughly 4 mm.  This caused us  to not fully cover the distal 60% lesion.  A second stent will be placed. Stent (Also treats lesions: Mid RCA-1, and Mid RCA-2) A stent was successfully placed using a STENT SYNERGY XD 3.0X8. Maximum pressure: 16 atm. Inflation time: 20 sec. Stent strut is well apposed. Stent overlaps previously placed stent. Post-stent angioplasty was performed using a BALLOON [place] EMERGE MR 3.5X6. Maximum pressure:  20 atm. Inflation time:  20 sec. 3 inflations to ensure the distal edge and adequate inflation in the overlap segment was achieved. Post-Intervention Lesion Assessment The intervention was successful. Pre-interventional TIMI flow is 3. Post-intervention TIMI flow is 3. Treated lesion length:  38 mm. No complications occurred at this lesion. Optical coherence tomography (OCT) was performed. Minimum stent area 9.07 mm. OCT supply: CATH DRAGONFLY OPSTAR. There is a 0% residual  stenosis post intervention.  Mid RCA-1 lesion Stent (Also treats lesions: Prox RCA to Mid RCA) See details in Prox RCA to Mid RCA lesion. Stent (Also treats lesions: Prox RCA to Mid RCA, and Mid RCA-2) See details in Prox RCA to Mid RCA lesion. Post-Intervention Lesion Assessment The intervention was successful. Pre-interventional TIMI flow is 3. Post-intervention TIMI flow is 3. No complications occurred at this lesion. There is a 0% residual stenosis post intervention.  Mid RCA-2 lesion Stent A stent was successfully placed. Stent (Also treats lesions: Prox RCA to Mid RCA, and Mid RCA-1) See details in Prox RCA to Mid RCA lesion. Post-Intervention Lesion Assessment The intervention was successful. Pre-interventional TIMI flow is 3. Post-intervention TIMI flow is 3. No complications occurred at this lesion. There is a 0% residual stenosis post intervention.   CARDIAC CATHETERIZATION  CARDIAC CATHETERIZATION 11/15/2017  Conclusion  Prox RCA to Mid RCA lesion is 60% stenosed.  Mid RCA lesion is 50% stenosed.  Prox Cx to Mid Cx lesion is 100% stenosed.  Ost 2nd Mrg lesion is 80% stenosed.  2nd Mrg lesion is 90% stenosed.  Ost 1st Mrg to 1st Mrg lesion is 50% stenosed.  Dist LM to Ost LAD lesion is 85% stenosed.  Prox LAD to Mid LAD lesion is 75% stenosed.  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  LVEDP 12 mm.  Previous echo documentation of hyperdynamic LV function with an EF of 65 to 70%.  Suspect high right radial artery take-off with stenosis/spasm above the elbow and  retrograde filling of brachial artery  RECOMMENDATION: Patient will be admitted following his cardiac catheterization.  I have discussed the catheterization findings with Dr. Army who will proceed with CABG revascularization surgery tomorrow.  Findings Coronary Findings Diagnostic  Dominance: Right  Left Main Dist LM to Ost LAD lesion is 85% stenosed.  Left Anterior Descending Prox LAD to Mid LAD lesion is 75% stenosed.  Left Circumflex Prox Cx to Mid Cx lesion is 100% stenosed.  First Obtuse Marginal Branch Ost 1st Mrg to 1st Mrg lesion is 50% stenosed.  Second Obtuse Marginal Branch Ost 2nd Mrg lesion is 80% stenosed. 2nd Mrg lesion is 90% stenosed.  Third Obtuse Marginal Branch Collaterals 3rd Mrg filled by collaterals from 3rd Sept.  Right Coronary Artery Prox RCA to Mid RCA lesion is 60% stenosed. Mid RCA lesion is 50% stenosed.  Intervention  No interventions have been documented.   STRESS TESTS  MYOCARDIAL PERFUSION IMAGING 01/24/2019  Interpretation Summary  The left ventricular ejection fraction is normal (55-65%).  Nuclear stress EF: 60%.  There was no ST segment deviation noted during stress.  No T wave inversion was noted during stress.  The study is normal.  This is a low risk study.  1.  Normal myocardial perfusion imaging study without evidence of ischemia or infarction. 2.  Normal left ventricular ejection fraction, 60%. 3.  Abnormal septal motion consistent with a postoperative state. 4.  This is a low risk study.  Darryle T. Barbaraann, MD [facility] [address] [place], [place] [address] [phone] 4:50 PM   ECHOCARDIOGRAM  ECHOCARDIOGRAM COMPLETE 08/02/2021  Narrative ECHOCARDIOGRAM REPORT    Patient Name:   Carl [name]   Date of Exam: 08/02/2021 Medical Rec #:  [id number]     Height:       68.0 in Accession #:    [id number]    Weight:       213.0 lb Date of Birth:  Jul 31, 1945      BSA:           2.099 m Patient Age:    76 years      BP:           138/70 mmHg Patient Gender: M             HR:           55 bpm. Exam Location:  [address]  Procedure: 2D Echo, Cardiac Doppler, Color Doppler and Intracardiac Opacification Agent  Indications:    I48.91 Atrial fibrillation  History:        Patient has prior history of Echocardiogram examinations, most recent 10/28/2020. CAD, Prior CABG, TIA, Arrythmias:Atrial Fibrillation and Atrial Flutter; Risk Factors:Hypertension, Sleep Apnea and Dyslipidemia.  Sonographer:    Marshia Lawyer BS, RDCS Referring Phys: [phone] THOMAS A KELLY   Sonographer Comments: Technically difficult study due to poor echo windows and suboptimal apical window. IMPRESSIONS   1. Left ventricular ejection fraction, by estimation, is 65 to 70%. The left ventricle has normal function. The left ventricle has no regional wall motion abnormalities. Left ventricular diastolic parameters are indeterminate. 2. Right ventricular systolic function is normal. The right ventricular size is normal. There is mildly elevated pulmonary artery systolic pressure. 3. Left atrial size was mildly dilated. 4. Right atrial size was moderately dilated. 5. Mild mitral valve regurgitation. 6. Tricuspid valve regurgitation is mild to moderate. 7. The aortic valve is tricuspid. Aortic valve regurgitation is mild. Aortic valve sclerosis/calcification is present, without any evidence of aortic stenosis. 8. The inferior vena cava is dilated in size with <50% respiratory variability, suggesting right atrial pressure of 15 mmHg.  Comparison(s): The left ventricular function is unchanged. 10/28/20 EF 55-60%.  FINDINGS Left Ventricle: Left ventricular ejection fraction, by estimation, is 65 to 70%. The left ventricle has normal function. The left ventricle has no regional wall motion abnormalities. Definity  contrast agent was given IV to delineate the left ventricular endocardial borders. The  left ventricular internal cavity size was normal in size. There is no left ventricular hypertrophy. Left ventricular diastolic parameters are indeterminate.  Right Ventricle: The right ventricular size is normal. Right vetricular  wall thickness was not assessed. Right ventricular systolic function is normal. There is mildly elevated pulmonary artery systolic pressure. The tricuspid regurgitant velocity is 2.68 m/s, and with an assumed right atrial pressure of 8 mmHg, the estimated right ventricular systolic pressure is 36.7 mmHg.  Left [facility]: Left atrial size was mildly dilated.  Right [facility]: Right atrial size was moderately dilated.  Pericardium: There is no evidence of pericardial effusion.  Mitral Valve: There is mild thickening of the mitral valve leaflet(s). Mild mitral valve regurgitation.  Tricuspid Valve: The tricuspid valve is normal in structure. Tricuspid valve regurgitation is mild to moderate.  Aortic Valve: The aortic valve is tricuspid. Aortic valve regurgitation is mild. Aortic regurgitation PHT measures 618 msec. Aortic valve sclerosis/calcification is present, without any evidence of aortic stenosis.  Pulmonic Valve: The pulmonic valve was not well visualized. Pulmonic valve regurgitation is mild.  Aorta: The aortic root and ascending aorta are structurally normal, with no evidence of dilitation.  Venous: The inferior vena cava is dilated in size with less than 50% respiratory variability, suggesting right atrial pressure of 15 mmHg.  IAS/Shunts: No atrial level shunt detected by color flow Doppler.   LEFT VENTRICLE PLAX 2D LVIDd:         4.70 cm   Diastology LVIDs:         2.90 cm   LV e' medial:    8.55 cm/s LV PW:         1.00 cm   LV E/e' medial:  12.2 LV IVS:        0.90 cm   LV e' lateral:   11.70 cm/s LVOT diam:     2.40 cm   LV E/e' lateral: 8.9 LV SV:         138 LV SV Index:   66 LVOT Area:     4.52 cm   RIGHT VENTRICLE             IVC RV Basal  diam:  5.40 cm     IVC diam: 2.20 cm RV Mid diam:    3.80 cm RV S prime:     11.10 cm/s TAPSE (M-mode): 1.6 cm RVSP:           36.7 mmHg  LEFT ATRIUM             Index        RIGHT ATRIUM           Index LA diam:        4.80 cm 2.29 cm/m   RA Pressure: 8.00 mmHg LA Vol (A2C):   79.1 ml 37.68 ml/m  RA Area:     27.50 cm LA Vol (A4C):   67.3 ml 32.06 ml/m  RA Volume:   107.00 ml 50.97 ml/m LA Biplane Vol: 73.1 ml 34.82 ml/m AORTIC VALVE LVOT Vmax:   117.00 cm/s LVOT Vmean:  76.000 cm/s LVOT VTI:    0.306 m AI PHT:      618 msec  AORTA Ao Root diam: 3.50 cm Ao Asc diam:  3.70 cm  MITRAL VALVE                TRICUSPID VALVE TR Peak grad:   28.7 mmHg MV Decel Time: 195 msec     TR Vmax:        268.00 cm/s MV E velocity: 104.00 cm/s  Estimated RAP:  8.00 mmHg MV A velocity: 59.70 cm/s   RVSP:  36.7 mmHg MV E/A ratio:  1.74 SHUNTS Systemic VTI:  0.31 m Systemic Diam: 2.40 cm  Vina Gull MD Electronically signed by Vina Gull MD Signature Date/Time: 08/02/2021/8:14:06 PM    Final   TEE  ECHO TEE 11/16/2017  Interpretation Summary  Septum: No Patent Foramen Ovale present.  Left [facility]rium: Patent foramen ovale not present.  Aortic valve: The valve is trileaflet. Mild valve thickening present. No stenosis. Trace regurgitation.  Tricuspid valve: Mild regurgitation. The tricuspid valve regurgitation jet is central.  Pulmonic valve: Trace regurgitation.  Mitral valve: Moderate leaflet thickening is present. Mild leaflet calcification is present. Mild mitral annular calcification. Mild regurgitation.  Aorta: The ascending aorta is dilated.  Right ventricle: Normal cavity size, wall thickness and ejection fraction.  Right ventricle: Normal cavity size and ejection fraction.    CT SCANS  CT CORONARY FRACTIONAL FLOW RESERVE DATA PREP 11/09/2017  Narrative CLINICAL DATA:  CAD  EXAM: FFR CT  TECHNIQUE: The best systolic and diastolic phases of the  patients gated cardiac CT scan sent to Heart Flow for hemodynamic analysis  FINDINGS: FFR CT positive in mid RCA .78 mid LAD .78 and AV groove .71  IMPRESSION: Positive FFR CT in mid RCA and mid LAD AV groove branch small and not likely significant  Maude Emmer   Electronically Signed By: Maude Emmer M.D. On: 11/10/2017 09:23   CT CORONARY MORPH W/CTA COR W/SCORE 11/09/2017  Addendum 11/09/2017  4:12 PM ADDENDUM REPORT: 11/09/2017 16:10  CLINICAL DATA:  Chest pain  EXAM: Cardiac CTA  MEDICATIONS: Sub lingual nitro. 4mg  and lopressor  5mg   TECHNIQUE: The patient was scanned on a [employer]. Gantry rotation speed was 270 msecs. Collimation was.9 mm. A 100 kV prospective scan was triggered in the descending thoracic aorta at 111 HU's with 5% padding centered around 78% of the R-R interval. Average HR during the scan was 62 bpm. The 3D data set was interpreted on a dedicated work station using MPR, MIP and VRT modes. A total of 80 cc of contrast was used.  FINDINGS: Non-cardiac: See separate report from [facility]. No significant findings on limited lung and soft tissue windows.  Calcium  Score: Calcium  noted in all 3 major epicardial coronary vessels  Coronary Arteries: Right dominant with no anomalies  LM: Normal  LAD: > 75% ostial and mid vessel calcific plaque. Less than 50% distal calcific plaque  IM: Small vessel not well seen  D1: Greater than 75% calcific plaque proximally and mid vessel  D2: Normal  Circumflex: Less than 50% proximal plaque  OM1: Less than 50% proximal plaque  OM2: 50-75% mixed plaque in AV groove  RCA: 50% calcified plaque proximally 50-75% calcified plaque in mid vessel  PDA: Normal  PLA: Normal  IMPRESSION: 1. Calcium  score 825 which is 78 th percentile for age and sex  2. Obstructive CAD see description above Study will be sent for FFR CT but patient will likely need heart catheterization  3.   Mild aortic root dilatation 4.1 cm  Maude Emmer   Electronically Signed By: Maude Emmer M.D. On: 11/09/2017 16:10  Narrative EXAM: OVER-READ INTERPRETATION  CT CHEST  The following report is an over-read performed by radiologist Dr. Franky Crease of [facility] on 11/09/2017. This over-read does not include interpretation of cardiac or coronary anatomy or pathology. The coronary CTA interpretation by the cardiologist is attached.  COMPARISON:  None.  FINDINGS: Vascular: Heart is normal size.  Visualized aorta is normal caliber.  Mediastinum/Nodes:  No adenopathy in the lower mediastinum or hila.  Lungs/Pleura: Dependent atelectasis in the lower lobes. No effusions.  Upper Abdomen: Imaging into the upper abdomen shows no acute findings.  Musculoskeletal: Chest wall soft tissues are unremarkable. No acute bony abnormality.  IMPRESSION: No acute or significant extracardiac abnormality.  Electronically Signed: By: Franky Crease M.D. On: 11/09/2017 15:46     ______________________________________________________________________________________________       Current Reported Medications:.    No outpatient medications have been marked as taking for the 12/14/23 encounter (Appointment) with Mekesha Solomon D, NP.    Physical Exam:    VS:  There were no vitals taken for this visit.   Wt Readings from Last 3 Encounters:  11/08/23 218 lb 3.2 oz (99 kg)  10/25/23 220 lb (99.8 kg)  10/17/23 220 lb 3.2 oz (99.9 kg)    GEN: Well nourished, well developed in no acute distress NECK: No JVD; No carotid bruits CARDIAC: ***RRR, no murmurs, rubs, gallops RESPIRATORY:  Clear to auscultation without rales, wheezing or rhonchi  ABDOMEN: Soft, non-tender, non-distended EXTREMITIES:  No edema; No acute deformity     Asessement and Plan:.     ***     Disposition: F/u with ***  Signed, Seher Schlagel D Jeziel Hoffmann, NP

## 2023-12-14 ENCOUNTER — Ambulatory Visit: Admitting: Cardiology

## 2023-12-15 ENCOUNTER — Encounter (INDEPENDENT_AMBULATORY_CARE_PROVIDER_SITE_OTHER): Admitting: Ophthalmology

## 2023-12-15 DIAGNOSIS — I10 Essential (primary) hypertension: Secondary | ICD-10-CM

## 2023-12-15 DIAGNOSIS — H353221 Exudative age-related macular degeneration, left eye, with active choroidal neovascularization: Secondary | ICD-10-CM

## 2023-12-15 DIAGNOSIS — Z961 Presence of intraocular lens: Secondary | ICD-10-CM

## 2023-12-15 DIAGNOSIS — T85398A Other mechanical complication of other ocular prosthetic devices, implants and grafts, initial encounter: Secondary | ICD-10-CM

## 2023-12-15 DIAGNOSIS — H35033 Hypertensive retinopathy, bilateral: Secondary | ICD-10-CM

## 2023-12-15 DIAGNOSIS — H4311 Vitreous hemorrhage, right eye: Secondary | ICD-10-CM

## 2023-12-18 NOTE — Progress Notes (Signed)
 [facility] - Clinic Note  12/22/2023   CHIEF COMPLAINT Patient presents for Retina Follow Up  HISTORY OF PRESENT ILLNESS: Carl [name] is a 78 y.o. male who presents to the clinic today for:  HPI     Retina Follow Up   Patient presents with  Other.  In right eye.  This started 3 weeks ago.  I, the attending physician,  performed the HPI with the patient and updated documentation appropriately.        Comments   Patient here for 3 weeks retina follow up for VH OD. Patient states vision doing good. No eye pain. using drops.      Last edited by Valdemar Rogue, MD on 12/30/2023  2:32 AM.       Referring physician: Sheldon Netter, PA [address] [place],  [place] [address]  HISTORICAL INFORMATION:  Selected notes from the MEDICAL RECORD NUMBER Referred by Dr. Alvia for [facility] 2/2 UGH syndrome LEE:  Ocular Hx- PMH-   CURRENT MEDICATIONS: Current Outpatient Medications (Ophthalmic Drugs)  Medication Sig   brimonidine  (ALPHAGAN ) 0.2 % ophthalmic solution Place 1 drop into the right eye every 8 (eight) hours.   COSOPT  2-0.5 % ophthalmic solution Place 1 drop into the right eye 2 (two) times daily.   dorzolamide -timolol  (COSOPT ) 2-0.5 % ophthalmic solution Place 1 drop into the right eye 2 (two) times daily.   prednisoLONE  acetate (PRED FORTE ) 1 % ophthalmic suspension Place 1 drop into the right eye 4 (four) times daily.   No current facility-administered medications for this visit. (Ophthalmic Drugs)   Current Outpatient Medications (Other)  Medication Sig   acetaminophen  (TYLENOL ) 500 MG tablet Take 2 tablets (1,000 mg total) by mouth every 6 (six) hours.   acyclovir  (ZOVIRAX ) 400 MG tablet Take 400 mg by mouth 2 (two) times daily.   apixaban  (ELIQUIS ) 5 MG TABS tablet Take 1 tablet (5 mg total) by mouth 2 (two) times daily.   cetirizine (ZYRTEC) 10 MG tablet Take 10 mg by mouth daily.   citalopram  (CELEXA ) 20 MG tablet Take 20 mg by mouth daily.    clopidogrel  (PLAVIX ) 75 MG tablet Take 1 tablet (75 mg total) by mouth daily.   ezetimibe  (ZETIA ) 10 MG tablet Take 1 tablet (10 mg total) by mouth daily.   FARXIGA 10 MG TABS tablet Take 10 mg by mouth daily.   ferrous sulfate  325 (65 FE) MG tablet Take 325 mg by mouth daily.   furosemide  (LASIX ) 40 MG tablet TAKE 1 TABLET BY MOUTH EVERY MORNING AND 1/2 TABLET EVERY EVENING   icosapent  Ethyl (VASCEPA ) 1 g capsule TAKE 2 CAPSULES(2 GRAMS) BY MOUTH TWICE DAILY   metFORMIN (GLUCOPHAGE-XR) 500 MG 24 hr tablet Take 500 mg by mouth in the morning and at bedtime.   metoprolol  tartrate (LOPRESSOR ) 25 MG tablet Take 0.5 tablets (12.5 mg total) by mouth 2 (two) times daily.   Misc Natural Products (JOINT HEALTH PO) Take 1 tablet by mouth daily.   Multiple Vitamins-Minerals (PRESERVISION AREDS 2 PO) Take 1 tablet by mouth 2 (two) times daily.   nitroGLYCERIN  (NITROSTAT ) 0.4 MG SL tablet DISSOLVE 1 TABLET UNDER THE TONGUE EVERY 5 MINUTES AS NEEDED FOR CHEST PAIN.   oxyCODONE  (OXY IR/ROXICODONE ) 5 MG immediate release tablet Take 1 tablet (5 mg total) by mouth every 6 (six) hours as needed for severe pain.   OZEMPIC, 1 MG/DOSE, 4 MG/3ML SOPN Inject 1 mg into the skin every Tuesday.   pantoprazole  (PROTONIX )  20 MG tablet Take 1 tablet (20 mg total) by mouth 2 (two) times daily. Office visit for further refills   potassium chloride  (KLOR-CON ) 20 MEQ packet Take 20 mEq by mouth daily.   rosuvastatin  (CRESTOR ) 40 MG tablet Take 1 tablet (40 mg total) by mouth daily. Please call [phone] to schedule a November appointment for future refills. Thank you.   levothyroxine  (SYNTHROID ) 150 MCG tablet TAKE 1 TABLET(150 MCG) BY MOUTH DAILY (Patient not taking: Reported on 12/22/2023)   lisinopril  (ZESTRIL ) 10 MG tablet Take 1 tablet (10 mg total) by mouth daily.   zolpidem  (AMBIEN ) 10 MG tablet Take 10 mg by mouth at bedtime.   No current facility-administered medications for this visit. (Other)   REVIEW OF  SYSTEMS: ROS   Positive for: Gastrointestinal, Musculoskeletal, Cardiovascular, Eyes Last edited by Orval Asberry RAMAN, COA on 12/22/2023  8:57 AM.       ALLERGIES No Known Allergies PAST MEDICAL HISTORY Past Medical History:  Diagnosis Date   Anxiety    takes Xanax  daily as needed   Arthritis    CAD (coronary artery disease) 11/2017   Cath:  p-mRCA 60%, mRCA 50%; dLM-Ost LAD 85%, p-mLAD 75%; p-mLCx 100% after OM1 w/ ost 80% & OM2 w/ ost 80% & prox 90% => s/p CABG x 4 (October 2019-DrSABRA Jude): LIMA-LAD, SVG-OM1-dCx, SVG-rPDA   Cataract    removed both eyes   Chronic diastolic CHF (congestive heart failure) (HCC) 01/2019   Normal LVEF of 65 to 70%.  Indeterminate diastolic parameters.  (Admitted September 2022)   Depression    takes Celexa  daily   Diverticulitis    Diverticulosis    Enlarged prostate    sightly   Esophagitis    Gastric ulcer    GERD (gastroesophageal reflux disease)    takes Protonix  daily   History of TIA (transient ischemic attack)    Hx of adenomatous colonic polyps    benign   Hyperlipidemia    Takes rosuvastatin  40 mg daily, Zetia  10 mg daily and Vascepa  2 g twice daily   Hypertension    Hypothyroidism    Insomnia    takes Ambien  nightly   Joint pain    Joint swelling    OSA (obstructive sleep apnea)    Paroxysmal atrial fibrillation (HCC)    On Eliquis    Pre-diabetes    Past Surgical History:  Procedure Laterality Date   A-FLUTTER ABLATION N/A 01/11/2019   Procedure: A-FLUTTER ABLATION;  Surgeon: Waddell Danelle ORN, MD;  Location: [facility] INVASIVE CV LAB;  Service: Cardiovascular;  Laterality: N/A;   CARDIOVERSION N/A 11/20/2018   Procedure: CARDIOVERSION;  Surgeon: Burnard Debby LABOR, MD;  Location: [facility] ENDOSCOPY;  Service: Cardiovascular;  Laterality: N/A;   cartliage removed from nose  60yrs ago   cataract surgery Bilateral    CERVICAL SPINE SURGERY     COLONOSCOPY     CORONARY ARTERY BYPASS GRAFT N/A 11/16/2017   Procedure: CORONARY ARTERY  BYPASS GRAFTING (CABG) x4. LEFT ENDOSCOPIC SAPHENOUS VEIN HARVEST AND MAMMARY ARTERY TAKE DOWN. LIMA TO LAD, SVG TO PDA, SVG TO DISTAL CIRC & OMI.;  Surgeon: Jude Dallas NOVAK, MD;  Location: [facility] OR;  Service: Open Heart Surgery;  Laterality: N/A;   CORONARY IMAGING/OCT N/A 10/25/2023   Procedure: CORONARY IMAGING/OCT;  Surgeon: Anner Alm ORN, MD;  Location[facility] INVASIVE CV LAB;  Service: Cardiovascular;  Laterality: N/A;   CORONARY STENT INTERVENTION N/A 10/25/2023   Procedure: CORONARY STENT INTERVENTION;  Surgeon: Anner Alm ORN, MD;  Location: [facility]  INVASIVE CV LAB;  Service: Cardiovascular;  Laterality: N/A;   EYE SURGERY     FOREIGN BODY REMOVAL ESOPHAGEAL     KNEE SURGERY Left    couple of times   LEFT HEART CATH AND CORONARY ANGIOGRAPHY N/A 11/15/2017   Procedure: LEFT HEART CATH AND CORONARY ANGIOGRAPHY;  Surgeon: Burnard Debby LABOR, MD;  Location: [facility] INVASIVE CV LAB;  Service: Cardiovascular: Proximal to mid RCA 60%, mid RCA 50%; distal LM-ostial LAD 85% with proximal to mid LAD 75%; proximal mid LCx 100%, ostial OM1 50%, OM2 80% and 90%;   NASAL SINUS SURGERY     NM MYOVIEW  LTD  01/2019   Normal myocardial perfusion imaging study without evidence of ischemia or infarction.  EF estimated 60%.  Abnormal septal motion consistent with postoperative state.  LOW RISK   PENILE PROSTHESIS IMPLANT N/A 03/08/2022   Procedure: INSERTION OF INFLATABLE PENILE IMPLANT;  Surgeon: Lovie Arlyss CROME, MD;  Location: [facility] ORS;  Service: Urology;  Laterality: N/A;  90 MINUTES NEEDED FOR CASE   POLYPECTOMY     RIGHT/LEFT HEART CATH AND CORONARY/GRAFT ANGIOGRAPHY N/A 10/25/2023   Procedure: RIGHT/LEFT HEART CATH AND CORONARY/GRAFT ANGIOGRAPHY;  Surgeon: Anner Alm ORN, MD;  Location: [facility] INVASIVE CV LAB;  Service: Cardiovascular;  Laterality: N/A;   TEE WITHOUT CARDIOVERSION N/A 11/16/2017   Procedure: TRANSESOPHAGEAL ECHOCARDIOGRAM (TEE);  Surgeon: Army Dallas NOVAK, MD;  Location: [facility] OR;  Service: Open Heart  Surgery;  Laterality: N/A;   TONSILLECTOMY     TOTAL HIP ARTHROPLASTY Right 04/17/2020   Procedure: RIGHT TOTAL HIP ARTHROPLASTY ANTERIOR APPROACH;  Surgeon: Vernetta Lonni GRADE, MD;  Location: [facility] ORS;  Service: Orthopedics;  Laterality: Right;   TOTAL KNEE ARTHROPLASTY Left 06/17/2014   Procedure: TOTAL KNEE ARTHROPLASTY;  Surgeon: Maude ORN Right, MD;  Location: [facility] OR;  Service: Orthopedics;  Laterality: Left;   TRANSTHORACIC ECHOCARDIOGRAM  07/2021   Normal LV size and function with EF of 65 to 70%.  No RWMA.  Indeterminate diastolic parameters-mildly dilated LA..  Mildly elevated PAP with moderate dilated RA and moderately elevated RAP estimated 15 mmHg..  Mild to moderate TR.  Mild AI with AoV sclerosis but no stenosis.   FAMILY HISTORY Family History  Problem Relation Age of Onset   Colon cancer Paternal Grandfather        questionable   Colon polyps Neg Hx    Esophageal cancer Neg Hx    Rectal cancer Neg Hx    Stomach cancer Neg Hx    SOCIAL HISTORY Social History   Tobacco Use   Smoking status: Never   Smokeless tobacco: Never  Vaping Use   Vaping status: Never Used  Substance Use Topics   Alcohol use: Yes    Alcohol/week: 10.0 standard drinks of alcohol    Types: 10 Standard drinks or equivalent per week    Comment: 2 drinks 5 nights per week   Drug use: No       OPHTHALMIC EXAM:  Base Eye Exam     Visual Acuity (Snellen - Linear)       Right Left   Dist cc 20/60 20/30 -2   Dist ph cc NI NI    Correction: Glasses         Tonometry (Tonopen, 8:54 AM)       Right Left   Pressure 15 13         Pupils       Dark Light Shape React APD   Right 4 4 Round Minimal  None   Left 3 2 Round Brisk None         Visual Fields (Counting fingers)       Left Right    Full Full         Extraocular Movement       Right Left    Full, Ortho Full, Ortho         Neuro/Psych     Oriented x3: Yes   Mood/Affect: Normal         Dilation      Both eyes: 1.0% Mydriacyl, 2.5% Phenylephrine  @ 8:54 AM           Slit Lamp and Fundus Exam     Slit Lamp Exam       Right Left   Lids/Lashes Dermatochalasis, mild MGD Dermatochalasis   Conjunctiva/Sclera White and quiet, temporal pinguecula White and quiet, nasal and temporal pinguecula   Cornea 1-2+ fine PEE, well healed cataract wound 1+ fine PEE, well healed cataract woud   Anterior Chamber Deep, 2+ fine cell and pigment, ?micro hyphema Deep and clear   Iris Irregular dilation, +iridoddonesis, scattered TIDs greatest 0200 Round and dilated   Lens PC IOL in good position, ?pseudophakodenisis PC IOL w/ mild temporal displacement   Anterior Vitreous Vitreous syneresis, + fine pigment, diffuse VH - improved, blood stained vit condensations settled inferiorly, Posterior vitreous detachment Vitreous syneresis, Posterior vitreous detachment         Fundus Exam       Right Left   Disc Trace pallor, sharp rim Pink and Sharp, +cupping   C/D Ratio 0.6 0.7   Macula Blunted foveal reflex, central drusen, PED's, pigment clumping Flat, blunted foveal reflex, cenral drusen, central PED, +CNV w/punctate heme and pigment clumping   Vessels Attenuated, Tortuous Attenuated, Tortuous   Periphery Retina attached, superior laser changes, operculated hole 1030 with good laser surrounding Attached, reticular degeneration, mild pavingstone inferiorly, no heme           IMAGING AND PROCEDURES  Imaging and Procedures for 12/22/2023  OCT, Retina - OU - Both Eyes       Right Eye Quality was good. Central Foveal Thickness: 380. Progression has improved. Findings include no IRF, no SRF, abnormal foveal contour, retinal drusen , intraretinal hyper-reflective material, pigment epithelial detachment, outer retinal atrophy (Interval improvement in diffuse vitreous opacities, central PEDs, no IRF, no SRF).   Left Eye Quality was good. Central Foveal Thickness: 348. Progression has been stable.  Findings include no IRF, no SRF, abnormal foveal contour, intraretinal hyper-reflective material, choroidal neovascular membrane, pigment epithelial detachment (Stable PEDs w/ no surrounding fluid).   Notes *Images captured and stored on drive  Diagnosis / Impression:  OD: Interval improvement in diffuse vitreous opacities, central PEDs, no IRF, no SRF OS: abnormal foveal profile w/ central CNV/PED, but no IRF/SRF  Clinical management:  See below  Abbreviations: NFP - Normal foveal profile. CME - cystoid macular edema. PED - pigment epithelial detachment. IRF - intraretinal fluid. SRF - subretinal fluid. EZ - ellipsoid zone. ERM - epiretinal membrane. ORA - outer retinal atrophy. ORT - outer retinal tubulation. SRHM - subretinal hyper-reflective material. IRHM - intraretinal hyper-reflective material            ASSESSMENT/PLAN:   ICD-10-CM   1. Uveitis-hyphema-glaucoma syndrome of right eye  T85.398A OCT, Retina - OU - Both Eyes   H20.9    H40.41X0     2. Vitreous hemorrhage of right eye (HCC)  H43.11  3. Exudative age-related macular degeneration of left eye with active choroidal neovascularization (HCC)  H35.3221 OCT, Retina - OU - Both Eyes    4. Essential hypertension  I10     5. Hypertensive retinopathy of both eyes  H35.033     6. Pseudophakia of both eyes  Z96.1      **JDM PT last seen on 10.10.25, received bilateral IVA**  1,2. UGH Syndrome/VH OD - Pt presented on 10.10.25 at appointment w/ Dr. Alvia, vitreous hemorrhage w/ BCVA OD 20/HM. Pt was given IVA OD - last visit prior to 10.10.25 was 02.2025 - OCT OD shows Diffuse vitreous opacities, retina grossly attached, central PEDs, no IRF or SRF. - Drops currently taking - Atropine once daily OD - Brim and Timolol  BID OD - Pred QID OD - BCVA OD today is improved 20/60 from 20/800 - Exam shows diffuse VH slightly improved, retina is grossly attached - VH precautions reviewed -- minimize activities, keep  head elevated, avoid ASA/NSAIDs/blood thinners as able - f/u 3-4 weeks, likely IVA OD , DFE, OCT  3. Exudative age related macular degeneration, OS  - The incidence pathology and anatomy of wet AMD discussed  - discussed treatment options including observation vs intravitreal anti-VEGF agents such as Avastin , Lucentis, Eylea.   - Risks of endophthalmitis and vascular occlusive events and atrophic changes discussed with patient  - s/p IVA OS on 10.10.25 w/ Dr. Alvia  - OCT shows Stable PED w/ no overlying fluid  - f/u 11.7.25   4,5. Hypertensive retinopathy OU - discussed importance of tight BP control - monitor   6. Pseudophakia OU  - s/p CE/IOL  - IOL in good position, doing well  - monitor   Ophthalmic Meds Ordered this visit:  Meds ordered this encounter  Medications   brimonidine  (ALPHAGAN ) 0.2 % ophthalmic solution    Sig: Place 1 drop into the right eye every 8 (eight) hours.    Dispense:  15 mL    Refill:  3   dorzolamide -timolol  (COSOPT ) 2-0.5 % ophthalmic solution    Sig: Place 1 drop into the right eye 2 (two) times daily.    Dispense:  10 mL    Refill:  3   prednisoLONE  acetate (PRED FORTE ) 1 % ophthalmic suspension    Sig: Place 1 drop into the right eye 4 (four) times daily.    Dispense:  15 mL    Refill:  3     Return in about 4 weeks (around 01/19/2024) for f/u, DFE, OCT.  There are no Patient Instructions on file for this visit.  This document serves as a record of services personally performed by Redell JUDITHANN Hans, MD, PhD. It was created on their behalf by Delon Newness COT, an ophthalmic technician. The creation of this record is the provider's dictation and/or activities during the visit.    Electronically signed by: Delon Newness COT 11.10.25  2:33 AM  This document serves as a record of services personally performed by Redell JUDITHANN Hans, MD, PhD. It was created on their behalf by Wanda GEANNIE Keens, COT an ophthalmic technician. The  creation of this record is the provider's dictation and/or activities during the visit.    Electronically signed by:  Wanda GEANNIE Keens, COT  12/30/23 2:33 AM  Redell JUDITHANN Hans, M.D., Ph.D. Diseases & Surgery of the Retina and Vitreous [facility] 12/22/2023   I have reviewed the above documentation for accuracy and completeness, and I agree with the above. Redell  JUDITHANN Hans, M.D., Ph.D. 12/30/23 2:34 AM   Abbreviations: M myopia (nearsighted); A astigmatism; H hyperopia (farsighted); P presbyopia; Mrx spectacle prescription;  CTL contact lenses; OD right eye; OS left eye; OU both eyes  XT exotropia; ET esotropia; PEK punctate epithelial keratitis; PEE punctate epithelial erosions; DES dry eye syndrome; MGD meibomian gland dysfunction; ATs artificial tears; PFAT's preservative free artificial tears; NSC nuclear sclerotic cataract; PSC posterior subcapsular cataract; ERM epi-retinal membrane; PVD posterior vitreous detachment; RD retinal detachment; DM diabetes mellitus; DR diabetic retinopathy; NPDR non-proliferative diabetic retinopathy; PDR proliferative diabetic retinopathy; CSME clinically significant macular edema; DME diabetic macular edema; dbh dot blot hemorrhages; CWS cotton wool spot; POAG primary open angle glaucoma; C/D cup-to-disc ratio; HVF humphrey visual field; GVF goldmann visual field; OCT optical coherence tomography; IOP intraocular pressure; BRVO Branch retinal vein occlusion; CRVO central retinal vein occlusion; CRAO central retinal artery occlusion; BRAO branch retinal artery occlusion; RT retinal tear; SB scleral buckle; PPV pars plana vitrectomy; VH Vitreous hemorrhage; PRP panretinal laser photocoagulation; IVK intravitreal kenalog ; VMT vitreomacular traction; MH Macular hole;  NVD neovascularization of the disc; NVE neovascularization elsewhere; AREDS age related eye disease study; ARMD age related macular degeneration; POAG primary open angle glaucoma;  EBMD epithelial/anterior basement membrane dystrophy; ACIOL anterior chamber intraocular lens; IOL intraocular lens; PCIOL posterior chamber intraocular lens; Phaco/IOL phacoemulsification with intraocular lens placement; PRK photorefractive keratectomy; LASIK laser assisted in situ keratomileusis; HTN hypertension; DM diabetes mellitus; COPD chronic obstructive pulmonary disease

## 2023-12-20 ENCOUNTER — Ambulatory Visit (HOSPITAL_COMMUNITY)

## 2023-12-22 ENCOUNTER — Encounter (INDEPENDENT_AMBULATORY_CARE_PROVIDER_SITE_OTHER): Payer: Self-pay | Admitting: Ophthalmology

## 2023-12-22 ENCOUNTER — Ambulatory Visit (INDEPENDENT_AMBULATORY_CARE_PROVIDER_SITE_OTHER): Admitting: Ophthalmology

## 2023-12-22 DIAGNOSIS — H353221 Exudative age-related macular degeneration, left eye, with active choroidal neovascularization: Secondary | ICD-10-CM

## 2023-12-22 DIAGNOSIS — H4311 Vitreous hemorrhage, right eye: Secondary | ICD-10-CM | POA: Diagnosis not present

## 2023-12-22 DIAGNOSIS — H35033 Hypertensive retinopathy, bilateral: Secondary | ICD-10-CM

## 2023-12-22 DIAGNOSIS — H209 Unspecified iridocyclitis: Secondary | ICD-10-CM

## 2023-12-22 DIAGNOSIS — H4041X0 Glaucoma secondary to eye inflammation, right eye, stage unspecified: Secondary | ICD-10-CM

## 2023-12-22 DIAGNOSIS — I10 Essential (primary) hypertension: Secondary | ICD-10-CM

## 2023-12-22 DIAGNOSIS — Z961 Presence of intraocular lens: Secondary | ICD-10-CM | POA: Diagnosis not present

## 2023-12-22 MED ORDER — PREDNISOLONE ACETATE 1 % OP SUSP: 1.0000 [drp] | Freq: Four times a day (QID) | OPHTHALMIC | 3 refills | Status: AC

## 2023-12-22 MED ORDER — BRIMONIDINE TARTRATE 0.2 % OP SOLN: 1.0000 [drp] | Freq: Three times a day (TID) | OPHTHALMIC | 3 refills | Status: AC

## 2023-12-22 MED ORDER — DORZOLAMIDE HCL-TIMOLOL MAL 2-0.5 % OP SOLN: 1.0000 [drp] | Freq: Two times a day (BID) | OPHTHALMIC | 3 refills | Status: AC

## 2023-12-26 ENCOUNTER — Other Ambulatory Visit: Payer: Self-pay | Admitting: Cardiology

## 2023-12-27 ENCOUNTER — Telehealth: Payer: Self-pay | Admitting: Cardiology

## 2023-12-27 ENCOUNTER — Encounter (HOSPITAL_COMMUNITY)

## 2023-12-27 ENCOUNTER — Telehealth (HOSPITAL_COMMUNITY): Payer: Self-pay

## 2023-12-27 NOTE — Telephone Encounter (Signed)
 Called pt regarding orientation appt today at 1315, no answer. Left message to call back. Will cancel current appts and return chart to schedulers for reschedule.   Con KATHEE Pereyra, MS, ACSM-CEP 12/27/2023 2:17 PM

## 2023-12-27 NOTE — Telephone Encounter (Signed)
 Returned call to pt.  Advised pt that he will need to contact his pcp for a refill for the Levothyroxine .  He verbalized understanding.

## 2023-12-27 NOTE — Telephone Encounter (Signed)
*  STAT* If patient is at the pharmacy, call can be transferred to refill team.   1. Which medications need to be refilled? (please list name of each medication and dose if known)   levothyroxine  (SYNTHROID ) 150 MCG tablet     4. Which pharmacy/location (including street and city if local pharmacy) is medication to be sent to?  [id number] - [place], [place] - [address] AT [facility] [address]     5. Do they need a 30 day or 90 day supply? 90   Pt states he is out

## 2023-12-30 ENCOUNTER — Encounter (INDEPENDENT_AMBULATORY_CARE_PROVIDER_SITE_OTHER): Payer: Self-pay | Admitting: Ophthalmology

## 2024-01-01 ENCOUNTER — Encounter (HOSPITAL_COMMUNITY)

## 2024-01-02 DIAGNOSIS — I509 Heart failure, unspecified: Secondary | ICD-10-CM | POA: Diagnosis not present

## 2024-01-02 DIAGNOSIS — Z6834 Body mass index (BMI) 34.0-34.9, adult: Secondary | ICD-10-CM | POA: Diagnosis not present

## 2024-01-02 DIAGNOSIS — I10 Essential (primary) hypertension: Secondary | ICD-10-CM | POA: Diagnosis not present

## 2024-01-02 DIAGNOSIS — Z1331 Encounter for screening for depression: Secondary | ICD-10-CM | POA: Diagnosis not present

## 2024-01-02 DIAGNOSIS — Z0000 Encounter for general adult medical examination without abnormal findings: Secondary | ICD-10-CM | POA: Diagnosis not present

## 2024-01-02 DIAGNOSIS — R809 Proteinuria, unspecified: Secondary | ICD-10-CM | POA: Diagnosis not present

## 2024-01-02 DIAGNOSIS — E1129 Type 2 diabetes mellitus with other diabetic kidney complication: Secondary | ICD-10-CM | POA: Diagnosis not present

## 2024-01-02 DIAGNOSIS — Z8673 Personal history of transient ischemic attack (TIA), and cerebral infarction without residual deficits: Secondary | ICD-10-CM | POA: Diagnosis not present

## 2024-01-02 DIAGNOSIS — I25119 Atherosclerotic heart disease of native coronary artery with unspecified angina pectoris: Secondary | ICD-10-CM | POA: Diagnosis not present

## 2024-01-03 ENCOUNTER — Encounter (HOSPITAL_COMMUNITY)

## 2024-01-05 ENCOUNTER — Encounter (HOSPITAL_COMMUNITY)

## 2024-01-07 DIAGNOSIS — E669 Obesity, unspecified: Secondary | ICD-10-CM | POA: Diagnosis not present

## 2024-01-07 DIAGNOSIS — I480 Paroxysmal atrial fibrillation: Secondary | ICD-10-CM | POA: Diagnosis not present

## 2024-01-07 DIAGNOSIS — E785 Hyperlipidemia, unspecified: Secondary | ICD-10-CM | POA: Diagnosis not present

## 2024-01-07 DIAGNOSIS — E1159 Type 2 diabetes mellitus with other circulatory complications: Secondary | ICD-10-CM | POA: Diagnosis not present

## 2024-01-08 ENCOUNTER — Encounter (HOSPITAL_COMMUNITY)

## 2024-01-10 ENCOUNTER — Telehealth (HOSPITAL_COMMUNITY): Payer: Self-pay

## 2024-01-10 ENCOUNTER — Encounter (HOSPITAL_COMMUNITY)

## 2024-01-10 NOTE — Telephone Encounter (Signed)
 Attempted f/u call to reschedule orientation- no answer, left message.  Closing referral.

## 2024-01-10 NOTE — Telephone Encounter (Signed)
 Patient called back stating he is interested in rescheduling cardiac rehab. Informed patient we will call him once our January schedule is open.

## 2024-01-12 ENCOUNTER — Encounter (HOSPITAL_COMMUNITY)

## 2024-01-15 ENCOUNTER — Encounter (HOSPITAL_COMMUNITY)

## 2024-01-15 ENCOUNTER — Other Ambulatory Visit: Payer: Self-pay

## 2024-01-16 ENCOUNTER — Other Ambulatory Visit: Payer: Self-pay | Admitting: Cardiology

## 2024-01-16 ENCOUNTER — Ambulatory Visit: Admitting: Cardiology

## 2024-01-17 ENCOUNTER — Encounter (HOSPITAL_COMMUNITY)

## 2024-01-17 NOTE — Progress Notes (Shared)
 [facility] - Clinic Note  01/19/2024   CHIEF COMPLAINT Patient presents for No chief complaint on file.  HISTORY OF PRESENT ILLNESS: Carl [name] is a 78 y.o. male who presents to the clinic today for:      Referring physician: Sheldon Netter, PA [address] [place],  [place] [address]  HISTORICAL INFORMATION:  Selected notes from the MEDICAL RECORD NUMBER Referred by Dr. Alvia for [facility] 2/2 UGH syndrome LEE:  Ocular Hx- PMH-   CURRENT MEDICATIONS: Current Outpatient Medications (Ophthalmic Drugs)  Medication Sig   brimonidine  (ALPHAGAN ) 0.2 % ophthalmic solution Place 1 drop into the right eye every 8 (eight) hours.   COSOPT  2-0.5 % ophthalmic solution Place 1 drop into the right eye 2 (two) times daily.   dorzolamide -timolol  (COSOPT ) 2-0.5 % ophthalmic solution Place 1 drop into the right eye 2 (two) times daily.   prednisoLONE  acetate (PRED FORTE ) 1 % ophthalmic suspension Place 1 drop into the right eye 4 (four) times daily.   No current facility-administered medications for this visit. (Ophthalmic Drugs)   Current Outpatient Medications (Other)  Medication Sig   acetaminophen  (TYLENOL ) 500 MG tablet Take 2 tablets (1,000 mg total) by mouth every 6 (six) hours.   acyclovir  (ZOVIRAX ) 400 MG tablet Take 400 mg by mouth 2 (two) times daily.   apixaban  (ELIQUIS ) 5 MG TABS tablet Take 1 tablet (5 mg total) by mouth 2 (two) times daily.   cetirizine (ZYRTEC) 10 MG tablet Take 10 mg by mouth daily.   citalopram  (CELEXA ) 20 MG tablet Take 20 mg by mouth daily.   clopidogrel  (PLAVIX ) 75 MG tablet Take 1 tablet (75 mg total) by mouth daily.   ezetimibe  (ZETIA ) 10 MG tablet Take 1 tablet (10 mg total) by mouth daily.   FARXIGA 10 MG TABS tablet Take 10 mg by mouth daily.   ferrous sulfate  325 (65 FE) MG tablet Take 325 mg by mouth daily.   furosemide  (LASIX ) 40 MG tablet TAKE 1 TABLET BY MOUTH EVERY MORNING AND 1/2 TABLET EVERY EVENING   icosapent  Ethyl  (VASCEPA ) 1 g capsule TAKE 2 CAPSULES(2 GRAMS) BY MOUTH TWICE DAILY   levothyroxine  (SYNTHROID ) 150 MCG tablet TAKE 1 TABLET(150 MCG) BY MOUTH DAILY (Patient not taking: Reported on 12/22/2023)   lisinopril  (ZESTRIL ) 10 MG tablet Take 1 tablet (10 mg total) by mouth daily.   metFORMIN (GLUCOPHAGE-XR) 500 MG 24 hr tablet Take 500 mg by mouth in the morning and at bedtime.   metoprolol  tartrate (LOPRESSOR ) 25 MG tablet Take 0.5 tablets (12.5 mg total) by mouth 2 (two) times daily.   Misc Natural Products (JOINT HEALTH PO) Take 1 tablet by mouth daily.   Multiple Vitamins-Minerals (PRESERVISION AREDS 2 PO) Take 1 tablet by mouth 2 (two) times daily.   nitroGLYCERIN  (NITROSTAT ) 0.4 MG SL tablet DISSOLVE 1 TABLET UNDER THE TONGUE EVERY 5 MINUTES AS NEEDED FOR CHEST PAIN.   oxyCODONE  (OXY IR/ROXICODONE ) 5 MG immediate release tablet Take 1 tablet (5 mg total) by mouth every 6 (six) hours as needed for severe pain.   OZEMPIC, 1 MG/DOSE, 4 MG/3ML SOPN Inject 1 mg into the skin every Tuesday.   pantoprazole  (PROTONIX ) 20 MG tablet Take 1 tablet (20 mg total) by mouth 2 (two) times daily. Office visit for further refills   potassium chloride  (KLOR-CON ) 20 MEQ packet Take 20 mEq by mouth daily.   rosuvastatin  (CRESTOR ) 40 MG tablet Take 1 tablet (40 mg total) by mouth daily. Please call  [phone] to schedule a November appointment for future refills. Thank you.   zolpidem  (AMBIEN ) 10 MG tablet Take 10 mg by mouth at bedtime.   No current facility-administered medications for this visit. (Other)   REVIEW OF SYSTEMS:     ALLERGIES No Known Allergies PAST MEDICAL HISTORY Past Medical History:  Diagnosis Date   Anxiety    takes Xanax  daily as needed   Arthritis    CAD (coronary artery disease) 11/2017   Cath:  p-mRCA 60%, mRCA 50%; dLM-Ost LAD 85%, p-mLAD 75%; p-mLCx 100% after OM1 w/ ost 80% & OM2 w/ ost 80% & prox 90% => s/p CABG x 4 (October 2019-Dr. Gerhardt): LIMA-LAD, SVG-OM1-dCx, SVG-rPDA    Cataract    removed both eyes   Chronic diastolic CHF (congestive heart failure) (HCC) 01/2019   Normal LVEF of 65 to 70%.  Indeterminate diastolic parameters.  (Admitted September 2022)   Depression    takes Celexa  daily   Diverticulitis    Diverticulosis    Enlarged prostate    sightly   Esophagitis    Gastric ulcer    GERD (gastroesophageal reflux disease)    takes Protonix  daily   History of TIA (transient ischemic attack)    Hx of adenomatous colonic polyps    benign   Hyperlipidemia    Takes rosuvastatin  40 mg daily, Zetia  10 mg daily and Vascepa  2 g twice daily   Hypertension    Hypothyroidism    Insomnia    takes Ambien  nightly   Joint pain    Joint swelling    OSA (obstructive sleep apnea)    Paroxysmal atrial fibrillation (HCC)    On Eliquis    Pre-diabetes    Past Surgical History:  Procedure Laterality Date   A-FLUTTER ABLATION N/A 01/11/2019   Procedure: A-FLUTTER ABLATION;  Surgeon: Waddell Danelle ORN, MD;  Location: [facility] INVASIVE CV LAB;  Service: Cardiovascular;  Laterality: N/A;   CARDIOVERSION N/A 11/20/2018   Procedure: CARDIOVERSION;  Surgeon: Burnard Debby LABOR, MD;  Location: [facility] ENDOSCOPY;  Service: Cardiovascular;  Laterality: N/A;   cartliage removed from nose  30yrs ago   cataract surgery Bilateral    CERVICAL SPINE SURGERY     COLONOSCOPY     CORONARY ARTERY BYPASS GRAFT N/A 11/16/2017   Procedure: CORONARY ARTERY BYPASS GRAFTING (CABG) x4. LEFT ENDOSCOPIC SAPHENOUS VEIN HARVEST AND MAMMARY ARTERY TAKE DOWN. LIMA TO LAD, SVG TO PDA, SVG TO DISTAL CIRC & OMI.;  Surgeon: Army Dallas NOVAK, MD;  Location: [facility] OR;  Service: Open Heart Surgery;  Laterality: N/A;   CORONARY IMAGING/OCT N/A 10/25/2023   Procedure: CORONARY IMAGING/OCT;  Surgeon: Anner Alm ORN, MD;  Location: [facility] INVASIVE CV LAB;  Service: Cardiovascular;  Laterality: N/A;   CORONARY STENT INTERVENTION N/A 10/25/2023   Procedure: CORONARY STENT INTERVENTION;  Surgeon: Anner Alm ORN, MD;   Location: [facility] INVASIVE CV LAB;  Service: Cardiovascular;  Laterality: N/A;   EYE SURGERY     FOREIGN BODY REMOVAL ESOPHAGEAL     KNEE SURGERY Left    couple of times   LEFT HEART CATH AND CORONARY ANGIOGRAPHY N/A 11/15/2017   Procedure: LEFT HEART CATH AND CORONARY ANGIOGRAPHY;  Surgeon: Burnard Debby LABOR, MD;  Location: [facility] INVASIVE CV LAB;  Service: Cardiovascular: Proximal to mid RCA 60%, mid RCA 50%; distal LM-ostial LAD 85% with proximal to mid LAD 75%; proximal mid LCx 100%, ostial OM1 50%, OM2 80% and 90%;   NASAL SINUS SURGERY     NM MYOVIEW  LTD  01/2019  Normal myocardial perfusion imaging study without evidence of ischemia or infarction.  EF estimated 60%.  Abnormal septal motion consistent with postoperative state.  LOW RISK   PENILE PROSTHESIS IMPLANT N/A 03/08/2022   Procedure: INSERTION OF INFLATABLE PENILE IMPLANT;  Surgeon: Lovie Arlyss CROME, MD;  Location: [facility] ORS;  Service: Urology;  Laterality: N/A;  90 MINUTES NEEDED FOR CASE   POLYPECTOMY     RIGHT/LEFT HEART CATH AND CORONARY/GRAFT ANGIOGRAPHY N/A 10/25/2023   Procedure: RIGHT/LEFT HEART CATH AND CORONARY/GRAFT ANGIOGRAPHY;  Surgeon: Anner Alm ORN, MD;  Location: [facility] INVASIVE CV LAB;  Service: Cardiovascular;  Laterality: N/A;   TEE WITHOUT CARDIOVERSION N/A 11/16/2017   Procedure: TRANSESOPHAGEAL ECHOCARDIOGRAM (TEE);  Surgeon: Army Dallas NOVAK, MD;  Location[facility] OR;  Service: Open Heart Surgery;  Laterality: N/A;   TONSILLECTOMY     TOTAL HIP ARTHROPLASTY Right 04/17/2020   Procedure: RIGHT TOTAL HIP ARTHROPLASTY ANTERIOR APPROACH;  Surgeon: Vernetta Lonni GRADE, MD;  Location: [facility] ORS;  Service: Orthopedics;  Laterality: Right;   TOTAL KNEE ARTHROPLASTY Left 06/17/2014   Procedure: TOTAL KNEE ARTHROPLASTY;  Surgeon: Maude ORN Right, MD;  Location: [facility] OR;  Service: Orthopedics;  Laterality: Left;   TRANSTHORACIC ECHOCARDIOGRAM  07/2021   Normal LV size and function with EF of 65 to 70%.  No RWMA.  Indeterminate  diastolic parameters-mildly dilated LA..  Mildly elevated PAP with moderate dilated RA and moderately elevated RAP estimated 15 mmHg..  Mild to moderate TR.  Mild AI with AoV sclerosis but no stenosis.   FAMILY HISTORY Family History  Problem Relation Age of Onset   Colon cancer Paternal Grandfather        questionable   Colon polyps Neg Hx    Esophageal cancer Neg Hx    Rectal cancer Neg Hx    Stomach cancer Neg Hx    SOCIAL HISTORY Social History   Tobacco Use   Smoking status: Never   Smokeless tobacco: Never  Vaping Use   Vaping status: Never Used  Substance Use Topics   Alcohol use: Yes    Alcohol/week: 10.0 standard drinks of alcohol    Types: 10 Standard drinks or equivalent per week    Comment: 2 drinks 5 nights per week   Drug use: No       OPHTHALMIC EXAM:  Not recorded    IMAGING AND PROCEDURES  Imaging and Procedures for 01/19/2024          ASSESSMENT/PLAN: No diagnosis found.  **JDM PT last seen on 10.10.25, received bilateral IVA**  1,2. UGH Syndrome/VH OD - Pt presented on 10.10.25 at appointment w/ Dr. Alvia, vitreous hemorrhage w/ BCVA OD 20/HM. Pt was given IVA OD - last visit prior to 10.10.25 was 02.2025 - OCT OD shows Diffuse vitreous opacities, retina grossly attached, central PEDs, no IRF or SRF. 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   - Risks of endophthalmitis and vascular occlusive  events and atrophic changes discussed with patient  -  s/p IVA OS on 10.10.25 w/ Dr. Alvia  - OCT shows Stable PED w/ no overlying fluid  - f/u 11.7.25   4,5. Hypertensive retinopathy OU - discussed importance of tight BP control - monitor   6. Pseudophakia OU  - s/p CE/IOL  - IOL in good position, doing well  - monitor   Ophthalmic Meds Ordered this visit:  No orders of the defined types were placed in this encounter.    No follow-ups on file.  There are no Patient Instructions on file for this visit. This document serves as a record of services personally performed by Redell JUDITHANN Hans, MD, PhD. It was created on their behalf by Delon Newness COT, an ophthalmic technician. The creation of this record is the provider's dictation and/or activities during the visit.    Electronically signed by: Delon Newness COT 12.10.25  8:45 AM   Abbreviations: M myopia (nearsighted); A astigmatism; H hyperopia (farsighted); P presbyopia; Mrx spectacle prescription;  CTL contact lenses; OD right eye; OS left eye; OU both eyes  XT exotropia; ET esotropia; PEK punctate epithelial keratitis; PEE punctate epithelial erosions; DES dry eye syndrome; MGD meibomian gland dysfunction; ATs artificial tears; PFAT's preservative free artificial tears; NSC nuclear sclerotic cataract; PSC posterior subcapsular cataract; ERM epi-retinal membrane; PVD posterior vitreous detachment; RD retinal detachment; DM diabetes mellitus; DR diabetic retinopathy; NPDR non-proliferative diabetic retinopathy; PDR proliferative diabetic retinopathy; CSME clinically significant macular edema; DME diabetic macular edema; dbh dot blot hemorrhages; CWS cotton wool spot; POAG primary open angle glaucoma; C/D cup-to-disc ratio; HVF humphrey visual field; GVF goldmann visual field; OCT optical coherence tomography; IOP intraocular pressure; BRVO Branch retinal vein occlusion; CRVO central retinal vein occlusion; CRAO central  retinal artery occlusion; BRAO branch retinal artery occlusion; RT retinal tear; SB scleral buckle; PPV pars plana vitrectomy; VH Vitreous hemorrhage; PRP panretinal laser photocoagulation; IVK intravitreal kenalog ; VMT vitreomacular traction; MH Macular hole;  NVD neovascularization of the disc; NVE neovascularization elsewhere; AREDS age related eye disease study; ARMD age related macular degeneration; POAG primary open angle glaucoma; EBMD epithelial/anterior basement membrane dystrophy; ACIOL anterior chamber intraocular lens; IOL intraocular lens; PCIOL posterior chamber intraocular lens; Phaco/IOL phacoemulsification with intraocular lens placement; PRK photorefractive keratectomy; LASIK laser assisted in situ keratomileusis; HTN hypertension; DM diabetes mellitus; COPD chronic obstructive pulmonary disease

## 2024-01-18 MED ORDER — ROSUVASTATIN CALCIUM 40 MG PO TABS: 40.0000 mg | ORAL_TABLET | Freq: Every day | ORAL | 3 refills | Status: AC

## 2024-01-18 MED ORDER — ROSUVASTATIN CALCIUM 40 MG PO TABS: 40.0000 mg | ORAL_TABLET | Freq: Every day | ORAL | 1 refills | Status: AC

## 2024-01-19 ENCOUNTER — Encounter (HOSPITAL_COMMUNITY)

## 2024-01-19 ENCOUNTER — Encounter (INDEPENDENT_AMBULATORY_CARE_PROVIDER_SITE_OTHER): Admitting: Ophthalmology

## 2024-01-22 ENCOUNTER — Other Ambulatory Visit: Payer: Self-pay

## 2024-01-22 ENCOUNTER — Encounter (HOSPITAL_COMMUNITY)

## 2024-01-22 NOTE — Progress Notes (Deleted)
 "  Cardiology Office Note    Date:  01/22/2024  ID:  WELLINGTON WINEGARDEN, DOB September 22, 1945, MRN [id number] PCP:  Sheldon Netter, PA  Cardiologist:  Alm Clay, MD  Electrophysiologist:  None   Chief Complaint: ***  History of Present Illness: SABRA    KAIMANI CLAYSON is a 78 y.o. male with visit-pertinent history of  typical atrial flutter, paroxysmal atrial fibrillation, chronic anticoagulation with Eliquis , CAD s/p CABG x 4 with LIMA to LAD, SVG to OM, SVG to diagonal circumflex, SVG to PDA in 2019, chronic diastolic heart failure, hypertension, hyperlipidemia, hypothyroidism and OSA.  Patient was previously followed by Dr. Burnard, has now transitioned care to Dr. Clay.   Patient's amiodarone  previously discontinued due to elevated LFTs and elevated TSH level. Patient underwent DCCV in 11/2018 however continued to have episodes of atrial flutter.  Patient underwent atrial flutter ablation with Dr. Waddell in December 2020.   Patient presented to the emergency room on 08/30/2023 with concerns regarding atrial fibrillation but left after not being seen by the MD.  His EKG showed A-fib with a rate of 117 bpm with aberrantly conducted beats versus PVCs.   Patient was seen by Dr. Clay on 10/17/2023 for progressive shortness of breath.  Patient reported that he noted since summer initially attributing it to the hot weather however would come completely out of breath after walking short distances.  He denies chest pain but reported very mild occasional indigestion-like symptoms when walking.  Cardiac catheterization was recommended.   On 10/25/2023 patient underwent cardiac catheterization, noted to have patent LIMA to LAD, sequential SVG to OM1-OM3 with atretic SVG to RPDA.  Patient had successful PCI/DES x 1 covering 3 lesions in the RCA with plan for triple therapy with aspirin , Plavix  and Eliquis  for 2 weeks then stopping aspirin  with continuation of Plavix  and Eliquis  for at least 6 months.  Is recommended that  patient take his Lasix  daily, noted to have a rate related left bundle branch block in the Cath Lab with recommendation to reduce beta-blocker dose in order to increase afterload reduction in outpatient setting.   On 11/01/2023 patient reported that he was having increased fatigue and shortness of breath, reported that he had not been taking any Lasix .  Patient was instructed to start Lasix  40 mg in the morning and 20 in the evening daily.   Patient was last in clinic on 11/08/2023 for follow-up.  He reportedly been doing well, denies chest pain.  He reported that following his catheterization he did have some worsening shortness of breath, reported he was not taking his Lasix , after resuming Lasix  he reported shortness breath at complete resolved.  He denied any increased lower extremity edema, thought or PND.  He denied any palpitations, racing presyncope.  He reported that he is getting back to his regular activity and was feeling about a centimeter significantly long time.  Patient noted multiple medications he was unsure if he was taking, requested that he notify the office following his office visit, does not appear this was completed.   On chart review patient presented to [facility] the ED via EMS after sliding off of a chair at home and landing on the floor, per notes he was unable to get himself up, his wife called EMS in the morning.  Per notes from ED workup overall reassuring and patient was ambulatory in the ED without issue.  Echocardiogram on 01/25/2024 indicated LVEF 55 to 60%, no RWMA, G1 DD, RV systolic function and size  normal, LA mildly dilated, trivial mitral regurgitation with no evidence of stenosis, aortic valve regurgitation mild to moderate, aortic valve sclerosis present without evidence of stenosis.   Today he presents for follow-up.  He reports that he     CAD: s/p CABG x 4 in 2019.  Patient with nuclear stress in 2020 that was reassuring.  Patient underwent cardiac  catheterization in setting of progressive dyspnea on 10/25/2023 with Dr. Anner, noted to have patent LIMA to LAD, sequential SVG to OM1-OM3 with atretic SVG to RPDA.  Patient had successful PCI/DES x 1 covering 3 lesions in the RCA with plan for triple therapy with aspirin , Plavix  and Eliquis  for 2 weeks then stopping aspirin  with continuation of Plavix  and Eliquis  for at least 6 months.  Today he reports that he  Reviewed ED precautions. Continue Plavix  75 mg daily and Eliquis  5 mg twice daily.   Chronic diastolic heart failure: Echocardiogram on 08/02/2021 indicated LVEF 65 to 70%, no RWMA, diastolic parameters were indeterminate, RV systolic function and size was normal, mildly elevated pulmonary artery systolic pressures, LA was mildly dilated, RA was moderately dilated, mild mitral valve regurgitation.  Echo on   PAF/atrial flutter: S/p flutter ablation.  Patient previously intolerant to amiodarone  due to elevated LFTs and TSH.   Today he reports that he    Hypertension: Blood pressure today  Continue    Hyperlipidemia: Last lipid profile on   OSA:   Labwork independently reviewed:   ROS: .   *** denies chest pain, shortness of breath, lower extremity edema, fatigue, palpitations, melena, hematuria, hemoptysis, diaphoresis, weakness, presyncope, syncope, orthopnea, and PND.  All other systems are reviewed and otherwise negative.  Studies Reviewed: SABRA    EKG:  EKG is ordered today, personally reviewed, demonstrating ***     CV Studies: Cardiac studies reviewed are outlined and summarized above. Otherwise please see EMR for full report. Cardiac Studies & Procedures   ______________________________________________________________________________________________ CARDIAC CATHETERIZATION  CARDIAC CATHETERIZATION 10/25/2023  Conclusion Table formatting from the original result was not included. Images from the original result were not included.    CULPRIT LESION: Prox RCA to Mid  RCA lesion is 70% stenosed.  Mid RCA-1 lesion is 80% stenosed. Mid RCA-2 lesion is 60% stenosed.  (SVG to RPDA is atretic)   A drug-eluting stent was successfully placed using a STENT SYNERGY XD 3.0X32.  The proximal 28 mm postdilated to 4.1 mm with the distal segment postdilated 3.5 mm.  Post intervention, there is a 0% residual stenosis.  TIMI-3 flow maintained   A 2ND stent was successfully placed overlapping the stent distally to ensure coverage of the final 60% stenosis, using a STENT SYNERGY XD 3.0X8.  This was postdilated at high atmospheres to 3.6 mm. Post intervention, there is a 0% residual stenosis.  TIMI-3 flow maintained   -------------------------------------------------------   Dist LM to Ost LAD lesion is 70% stenosed.  Prox LAD to Mid LAD lesion is 75% stenosed.   Ost 1st Mrg to 1st Mrg lesion is 75% stenosed.  Small caliber 2nd Mrg lesion is 80% stenosed. Ost 2nd Mrg lesion is 70% stenosed. Dist Cx lesion is 100% stenosed.   -----------------3/4 Patent GRAFTS-----------------------------------   LIMA-dLAD graft was visualized by angiography and is normal in caliber.  The graft exhibits no disease. There is competitive flow.   Seq SVG- SVG-OM1-OM3 graft was visualized by angiography and is large.  The graft exhibits no disease.  The flow is not reversed.  There is no competitive flow  SVG-rPDA graft was visualized by angiography and is small.  The graft exhibits severe diffuse disease.  Origin lesion is 90% stenosed. Prox Graft to Insertion lesion is 60% stenosed.   ----------------------- HEMODYNAMICS ---------------------------   The left ventricular systolic function is normal.  The left ventricular ejection fraction is greater than 65% by visual estimate.  There is no aortic valve stenosis.   Hemodynamic findings consistent with mild pulmonary hypertension.  Mean PAP 25 mL of mercury with LVEDP of 24 mmHg and PCWP of 22 mm..   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   Normal Fick Cardiac Output and Index: 6.48-3.08  Diagnostic Dominance: Right     Intervention   RECOMMENDATIONS   In the absence of any other complications or medical issues, we expect the patient to be ready for discharge from an interventional cardiology perspective on 10/25/2023.   Ensure that the patient is taking his Lasix  daily; With rate related left bundle branch block in the Cath Lab, will likely reduce beta-blocker dose in order to increase afterload reduction.  This can be done in the outpatient setting.   Recommend to resume Apixaban , at currently prescribed dose and frequency on 10/25/2023.   Recommend concurrent antiplatelet therapy of Aspirin  81 mg for 1 month and Clopidogrel  75mg  daily for 6 months .   Will actually stop aspirin  at 2 weeks, and continue Plavix  for 6 months along with Eliquis .3    Alm Clay, MD  Findings Coronary Findings Diagnostic  Dominance: Right  Left Main Dist LM to Ost LAD lesion is 70% stenosed.  Left Anterior Descending There is mild diffuse disease throughout the vessel. Prox LAD to Mid LAD lesion is 75% stenosed.  Left Circumflex Dist Cx lesion is 100% stenosed.  First Obtuse Marginal Branch Ost 1st Mrg to 1st Mrg lesion is 75% stenosed.  Second Obtuse Marginal Branch Ost 2nd Mrg lesion is 70% stenosed. 2nd Mrg lesion is 80% stenosed.  Third Obtuse Marginal Branch Vessel is large in size.  Left Posterior Atrioventricular Artery Vessel is small in size.  Right Coronary Artery Prox RCA to Mid RCA lesion is 70% stenosed. The lesion is type B2, segmental and eccentric. The lesion is mildly calcified. Optical coherence tomography (OCT) was performed. Minimum lumen area: 1.81 mm. Minimum stent area: 9.07 mm. Mid RCA-1 lesion is 80% stenosed. Mid RCA-2 lesion is 60% stenosed.  Acute Marginal  Branch Vessel is small in size.  LIMA LIMA Graft To Mid LAD LIMA graft was visualized by angiography and is normal in caliber.  The graft exhibits no disease. There is competitive flow.  Sequential Jump Graft Graft To 1st Mrg, 3rd Mrg Seq SVG- SVG-OM1-OM3 graft was visualized by angiography and is large.  The graft exhibits no disease. The flow is not reversed. There is no competitive flow  Saphenous Graft To Ost RPDA SVG graft was visualized by angiography and is small.  The graft exhibits severe diffuse disease. The graft is atretic. Origin lesion is 90% stenosed. Prox Graft to Insertion lesion is 60% stenosed.  Intervention  Prox RCA to Mid RCA lesion Stent (Also treats lesions: Mid RCA-1) Lesion length:  34 mm. CATH VISTA GUIDE 6FR JR4 ECOPK guide catheter was inserted. Lesion crossed with guidewire using a WIRE ASAHI PROWATER 180CM. Pre-stent angioplasty was not performed. A drug-eluting stent was successfully placed using  a STENT SYNERGY XD 3.0X32. Maximum pressure: 16 atm. Inflation time: 30 sec. Stent strut is well apposed. Post-stent angioplasty was performed using a BALLOON SAPPHIRE NC24 4.0X18. Maximum pressure:  20 atm. Inflation time:  20 sec. 2 inflations to cover the proximal 28 mm of the stent During present appointment, the patient took a deep breath in the catheter pullback bringing the stent back proximally roughly 4 mm.  This caused us  to not fully cover the distal 60% lesion.  A second stent will be placed. Stent (Also treats lesions: Mid RCA-1, and Mid RCA-2) A stent was successfully placed using a STENT SYNERGY XD 3.0X8. Maximum pressure: 16 atm. Inflation time: 20 sec. Stent strut is well apposed. Stent overlaps previously placed stent. Post-stent angioplasty was performed using a BALLOON Kline EMERGE MR 3.5X6. Maximum pressure:  20 atm. Inflation time:  20 sec. 3 inflations to ensure the distal edge and adequate inflation in the overlap segment was  achieved. Post-Intervention Lesion Assessment The intervention was successful. Pre-interventional TIMI flow is 3. Post-intervention TIMI flow is 3. Treated lesion length:  38 mm. No complications occurred at this lesion. Optical coherence tomography (OCT) was performed. Minimum stent area 9.07 mm. OCT supply: CATH DRAGONFLY OPSTAR. There is a 0% residual stenosis post intervention.  Mid RCA-1 lesion Stent (Also treats lesions: Prox RCA to Mid RCA) See details in Prox RCA to Mid RCA lesion. Stent (Also treats lesions: Prox RCA to Mid RCA, and Mid RCA-2) See details in Prox RCA to Mid RCA lesion. Post-Intervention Lesion Assessment The intervention was successful. Pre-interventional TIMI flow is 3. Post-intervention TIMI flow is 3. No complications occurred at this lesion. There is a 0% residual stenosis post intervention.  Mid RCA-2 lesion Stent A stent was successfully placed. Stent (Also treats lesions: Prox RCA to Mid RCA, and Mid RCA-1) See details in Prox RCA to Mid RCA lesion. Post-Intervention Lesion Assessment The intervention was successful. Pre-interventional TIMI flow is 3. Post-intervention TIMI flow is 3. No complications occurred at this lesion. There is a 0% residual stenosis post intervention.   CARDIAC CATHETERIZATION  CARDIAC CATHETERIZATION 11/15/2017  Conclusion  Prox RCA to Mid RCA lesion is 60% stenosed.  Mid RCA lesion is 50% stenosed.  Prox Cx to Mid Cx lesion is 100% stenosed.  Ost 2nd Mrg lesion is 80% stenosed.  2nd Mrg lesion is 90% stenosed.  Ost 1st Mrg to 1st Mrg lesion is 50% stenosed.  Dist LM to Ost LAD lesion is 85% stenosed.  Prox LAD to Mid LAD lesion is 75% stenosed.  Severe multivessel CAD with 85% ostial LAD stenosis, diffuse 75% mid LAD stenosis; total occlusion of the mid AV groove circumflex after the takeoff of the second obtuse marginal vessel with 50% diffuse stenosis in the first large marginal branch, 80 and 90% stenoses in  the second obtuse marginal and extensive collateralization to the distal circumflex and third marginal via the LAD circulation; 60 and 50% mid RCA stenoses in a dominant RCA.  LVEDP 12 mm.  Previous echo documentation of hyperdynamic LV function with an EF of 65 to 70%.  Suspect high right radial artery take-off with stenosis/spasm above the elbow and retrograde filling of brachial artery  RECOMMENDATION: Patient will be admitted following his cardiac catheterization.  I have discussed the catheterization findings with Dr. Army who will proceed with CABG revascularization surgery tomorrow.  Findings Coronary Findings Diagnostic  Dominance: Right  Left Main Dist LM to Ost LAD lesion is 85% stenosed.  Left Anterior  Descending Prox LAD to Mid LAD lesion is 75% stenosed.  Left Circumflex Prox Cx to Mid Cx lesion is 100% stenosed.  First Obtuse Marginal Branch Ost 1st Mrg to 1st Mrg lesion is 50% stenosed.  Second Obtuse Marginal Branch Ost 2nd Mrg lesion is 80% stenosed. 2nd Mrg lesion is 90% stenosed.  Third Obtuse Marginal Branch Collaterals 3rd Mrg filled by collaterals from 3rd Sept.  Right Coronary Artery Prox RCA to Mid RCA lesion is 60% stenosed. Mid RCA lesion is 50% stenosed.  Intervention  No interventions have been documented.   STRESS TESTS  MYOCARDIAL PERFUSION IMAGING 01/24/2019  Interpretation Summary  The left ventricular ejection fraction is normal (55-65%).  Nuclear stress EF: 60%.  There was no ST segment deviation noted during stress.  No T wave inversion was noted during stress.  The study is normal.  This is a low risk study.  1.  Normal myocardial perfusion imaging study without evidence of ischemia or infarction. 2.  Normal left ventricular ejection fraction, 60%. 3.  Abnormal septal motion consistent with a postoperative state. 4.  This is a low risk study.  Darryle T. Barbaraann, MD [facility] [address] [place], [place] [address] [phone] 4:50 PM   ECHOCARDIOGRAM  ECHOCARDIOGRAM COMPLETE 08/02/2021  Narrative ECHOCARDIOGRAM REPORT    Patient Name:   AIDIAN SALOMON   Date of Exam: 08/02/2021 Medical Rec #:  [id number]     Height:       68.0 in Accession #:    [id number]    Weight:       213.0 lb Date of Birth:  May 24, 1945      BSA:          2.099 m Patient Age:    76 years      BP:           138/70 mmHg Patient Gender: M             HR:           55 bpm. Exam Location:  [address]  Procedure: 2D Echo, Cardiac Doppler, Color Doppler and Intracardiac Opacification Agent  Indications:    I48.91 Atrial fibrillation  History:        Patient has prior history of Echocardiogram examinations, most recent 10/28/2020. CAD, Prior CABG, TIA, Arrythmias:Atrial Fibrillation and Atrial Flutter; Risk Factors:Hypertension, Sleep Apnea and Dyslipidemia.  Sonographer:    Marshia Lawyer BS, RDCS Referring Phys: [phone] THOMAS A KELLY   Sonographer Comments: Technically difficult study due to poor echo windows and suboptimal apical window. IMPRESSIONS   1. Left ventricular ejection fraction, by estimation, is 65 to 70%. The left ventricle has normal function. The left ventricle has no regional wall motion abnormalities. Left ventricular diastolic parameters are indeterminate. 2. Right ventricular systolic function is normal. The right ventricular size is normal. There is mildly elevated pulmonary artery systolic pressure. 3. Left atrial size was mildly dilated. 4. Right atrial size was moderately dilated. 5. Mild mitral valve regurgitation. 6. Tricuspid valve regurgitation is mild to moderate. 7. The aortic valve is tricuspid. Aortic valve regurgitation is mild. Aortic valve sclerosis/calcification is present, without any evidence of aortic stenosis. 8. The inferior vena cava is dilated in size with <50% respiratory variability, suggesting right atrial pressure of 15  mmHg.  Comparison(s): The left ventricular function is unchanged. 10/28/20 EF 55-60%.  FINDINGS Left Ventricle: Left ventricular ejection fraction, by estimation, is 65 to 70%. The left ventricle has normal  function. The left ventricle has no regional wall motion abnormalities. Definity  contrast agent was given IV to delineate the left ventricular endocardial borders. The left ventricular internal cavity size was normal in size. There is no left ventricular hypertrophy. Left ventricular diastolic parameters are indeterminate.  Right Ventricle: The right ventricular size is normal. Right vetricular wall thickness was not assessed. Right ventricular systolic function is normal. There is mildly elevated pulmonary artery systolic pressure. The tricuspid regurgitant velocity is 2.68 m/s, and with an assumed right atrial pressure of 8 mmHg, the estimated right ventricular systolic pressure is 36.7 mmHg.  Left [facility]: Left atrial size was mildly dilated.  Right [facility]: Right atrial size was moderately dilated.  Pericardium: There is no evidence of pericardial effusion.  Mitral Valve: There is mild thickening of the mitral valve leaflet(s). Mild mitral valve regurgitation.  Tricuspid Valve: The tricuspid valve is normal in structure. Tricuspid valve regurgitation is mild to moderate.  Aortic Valve: The aortic valve is tricuspid. Aortic valve regurgitation is mild. Aortic regurgitation PHT measures 618 msec. Aortic valve sclerosis/calcification is present, without any evidence of aortic stenosis.  Pulmonic Valve: The pulmonic valve was not well visualized. Pulmonic valve regurgitation is mild.  Aorta: The aortic root and ascending aorta are structurally normal, with no evidence of dilitation.  Venous: The inferior vena cava is dilated in size with less than 50% respiratory variability, suggesting right atrial pressure of 15 mmHg.  IAS/Shunts: No atrial level shunt detected by color flow  Doppler.   LEFT VENTRICLE PLAX 2D LVIDd:         4.70 cm   Diastology LVIDs:         2.90 cm   LV e' medial:    8.55 cm/s LV PW:         1.00 cm   LV E/e' medial:  12.2 LV IVS:        0.90 cm   LV e' lateral:   11.70 cm/s LVOT diam:     2.40 cm   LV E/e' lateral: 8.9 LV SV:         138 LV SV Index:   66 LVOT Area:     4.52 cm   RIGHT VENTRICLE             IVC RV Basal diam:  5.40 cm     IVC diam: 2.20 cm RV Mid diam:    3.80 cm RV S prime:     11.10 cm/s TAPSE (M-mode): 1.6 cm RVSP:           36.7 mmHg  LEFT ATRIUM             Index        RIGHT ATRIUM           Index LA diam:        4.80 cm 2.29 cm/m   RA Pressure: 8.00 mmHg LA Vol (A2C):   79.1 ml 37.68 ml/m  RA Area:     27.50 cm LA Vol (A4C):   67.3 ml 32.06 ml/m  RA Volume:   107.00 ml 50.97 ml/m LA Biplane Vol: 73.1 ml 34.82 ml/m AORTIC VALVE LVOT Vmax:   117.00 cm/s LVOT Vmean:  76.000 cm/s LVOT VTI:    0.306 m AI PHT:      618 msec  AORTA Ao Root diam: 3.50 cm Ao Asc diam:  3.70 cm  MITRAL VALVE                TRICUSPID  VALVE TR Peak grad:   28.7 mmHg MV Decel Time: 195 msec     TR Vmax:        268.00 cm/s MV E velocity: 104.00 cm/s  Estimated RAP:  8.00 mmHg MV A velocity: 59.70 cm/s   RVSP:           36.7 mmHg MV E/A ratio:  1.74 SHUNTS Systemic VTI:  0.31 m Systemic Diam: 2.40 cm  Vina Gull MD Electronically signed by Vina Gull MD Signature Date/Time: 08/02/2021/8:14:06 PM    Final   TEE  ECHO TEE 11/16/2017  Interpretation Summary  Septum: No Patent Foramen Ovale present.  Left [facility]rium: Patent foramen ovale not present.  Aortic valve: The valve is trileaflet. Mild valve thickening present. No stenosis. Trace regurgitation.  Tricuspid valve: Mild regurgitation. The tricuspid valve regurgitation jet is central.  Pulmonic valve: Trace regurgitation.  Mitral valve: Moderate leaflet thickening is present. Mild leaflet calcification is present. Mild mitral annular calcification. Mild  regurgitation.  Aorta: The ascending aorta is dilated.  Right ventricle: Normal cavity size, wall thickness and ejection fraction.  Right ventricle: Normal cavity size and ejection fraction.    CT SCANS  CT CORONARY FRACTIONAL FLOW RESERVE DATA PREP 11/09/2017  Narrative CLINICAL DATA:  CAD  EXAM: FFR CT  TECHNIQUE: The best systolic and diastolic phases of the patients gated cardiac CT scan sent to Heart Flow for hemodynamic analysis  FINDINGS: FFR CT positive in mid RCA .78 mid LAD .78 and AV groove .71  IMPRESSION: Positive FFR CT in mid RCA and mid LAD AV groove branch small and not likely significant  Maude Emmer   Electronically Signed By: Maude Emmer M.D. On: 11/10/2017 09:23   CT CORONARY MORPH W/CTA COR W/SCORE 11/09/2017  Addendum 11/09/2017  4:12 PM ADDENDUM REPORT: 11/09/2017 16:10  CLINICAL DATA:  Chest pain  EXAM: Cardiac CTA  MEDICATIONS: Sub lingual nitro. 4mg  and lopressor  5mg   TECHNIQUE: The patient was scanned on a [employer]. Gantry rotation speed was 270 msecs. Collimation was.9 mm. A 100 kV prospective scan was triggered in the descending thoracic aorta at 111 HU's with 5% padding centered around 78% of the R-R interval. Average HR during the scan was 62 bpm. The 3D data set was interpreted on a dedicated work station using MPR, MIP and VRT modes. A total of 80 cc of contrast was used.  FINDINGS: Non-cardiac: See separate report from [facility]. No significant findings on limited lung and soft tissue windows.  Calcium  Score: Calcium  noted in all 3 major epicardial coronary vessels  Coronary Arteries: Right dominant with no anomalies  LM: Normal  LAD: > 75% ostial and mid vessel calcific plaque. Less than 50% distal calcific plaque  IM: Small vessel not well seen  D1: Greater than 75% calcific plaque proximally and mid vessel  D2: Normal  Circumflex: Less than 50% proximal plaque  OM1: Less  than 50% proximal plaque  OM2: 50-75% mixed plaque in AV groove  RCA: 50% calcified plaque proximally 50-75% calcified plaque in mid vessel  PDA: Normal  PLA: Normal  IMPRESSION: 1. Calcium  score 825 which is 78 th percentile for age and sex  2. Obstructive CAD see description above Study will be sent for FFR CT but patient will likely need heart catheterization  3.  Mild aortic root dilatation 4.1 cm  Maude Emmer   Electronically Signed By: Maude Emmer M.D. On: 11/09/2017 16:10  Narrative EXAM: OVER-READ INTERPRETATION  CT CHEST  The following  report is an over-read performed by radiologist Dr. Franky Crease of [facility] on 11/09/2017. This over-read does not include interpretation of cardiac or coronary anatomy or pathology. The coronary CTA interpretation by the cardiologist is attached.  COMPARISON:  None.  FINDINGS: Vascular: Heart is normal size.  Visualized aorta is normal caliber.  Mediastinum/Nodes: No adenopathy in the lower mediastinum or hila.  Lungs/Pleura: Dependent atelectasis in the lower lobes. No effusions.  Upper Abdomen: Imaging into the upper abdomen shows no acute findings.  Musculoskeletal: Chest wall soft tissues are unremarkable. No acute bony abnormality.  IMPRESSION: No acute or significant extracardiac abnormality.  Electronically Signed: By: Franky Crease M.D. On: 11/09/2017 15:46     ______________________________________________________________________________________________       Current Reported Medications:.    Active Medications[1]  Physical Exam:    VS:  There were no vitals taken for this visit.   Wt Readings from Last 3 Encounters:  11/08/23 218 lb 3.2 oz (99 kg)  10/25/23 220 lb (99.8 kg)  10/17/23 220 lb 3.2 oz (99.9 kg)    GEN: Well nourished, well developed in no acute distress NECK: No JVD; No carotid bruits CARDIAC: ***RRR, no murmurs, rubs, gallops RESPIRATORY:  Clear to  auscultation without rales, wheezing or rhonchi  ABDOMEN: Soft, non-tender, non-distended EXTREMITIES:  No edema; No acute deformity     Asessement and Plan:.     ***  {The patient has an active order for outpatient cardiac rehabilitation.   Please indicate if the patient is ready to start. Do NOT delete this.  It will auto delete.  Refresh note, then sign.              Click here to document readiness and see contraindications.  :1}  Cardiac Rehabilitation Eligibility Assessment      Disposition: F/u with ***  Signed, Sharon Rubis D [place] Rabenold, NP       [1]  No outpatient medications have been marked as taking for the 01/26/24 encounter (Appointment) with Aarna Mihalko D, NP.   "

## 2024-01-24 ENCOUNTER — Encounter (HOSPITAL_COMMUNITY)

## 2024-01-24 MED ORDER — METOPROLOL TARTRATE 25 MG PO TABS: 12.5000 mg | ORAL_TABLET | Freq: Two times a day (BID) | ORAL | 3 refills | Status: AC

## 2024-01-25 ENCOUNTER — Ambulatory Visit (HOSPITAL_COMMUNITY): Admission: RE | Admit: 2024-01-25 | Discharge: 2024-01-25 | Attending: Cardiology | Admitting: Cardiology

## 2024-01-25 DIAGNOSIS — E785 Hyperlipidemia, unspecified: Secondary | ICD-10-CM | POA: Diagnosis present

## 2024-01-25 DIAGNOSIS — R079 Chest pain, unspecified: Secondary | ICD-10-CM | POA: Diagnosis not present

## 2024-01-25 DIAGNOSIS — I2510 Atherosclerotic heart disease of native coronary artery without angina pectoris: Secondary | ICD-10-CM | POA: Diagnosis not present

## 2024-01-25 DIAGNOSIS — I200 Unstable angina: Secondary | ICD-10-CM | POA: Insufficient documentation

## 2024-01-25 DIAGNOSIS — Z951 Presence of aortocoronary bypass graft: Secondary | ICD-10-CM | POA: Insufficient documentation

## 2024-01-25 LAB — ECHOCARDIOGRAM COMPLETE
Area-P 1/2: 3.63 cm2
S' Lateral: 3.15 cm

## 2024-01-26 ENCOUNTER — Ambulatory Visit: Attending: Cardiology | Admitting: Cardiology

## 2024-01-26 ENCOUNTER — Encounter (HOSPITAL_COMMUNITY)

## 2024-01-26 DIAGNOSIS — I483 Typical atrial flutter: Secondary | ICD-10-CM

## 2024-01-26 DIAGNOSIS — E782 Mixed hyperlipidemia: Secondary | ICD-10-CM

## 2024-01-26 DIAGNOSIS — I5032 Chronic diastolic (congestive) heart failure: Secondary | ICD-10-CM

## 2024-01-26 DIAGNOSIS — I2510 Atherosclerotic heart disease of native coronary artery without angina pectoris: Secondary | ICD-10-CM

## 2024-01-26 DIAGNOSIS — I480 Paroxysmal atrial fibrillation: Secondary | ICD-10-CM

## 2024-01-26 DIAGNOSIS — G4733 Obstructive sleep apnea (adult) (pediatric): Secondary | ICD-10-CM

## 2024-01-26 DIAGNOSIS — I10 Essential (primary) hypertension: Secondary | ICD-10-CM

## 2024-01-26 DIAGNOSIS — Z951 Presence of aortocoronary bypass graft: Secondary | ICD-10-CM

## 2024-01-29 ENCOUNTER — Encounter (HOSPITAL_COMMUNITY)

## 2024-01-31 ENCOUNTER — Encounter (HOSPITAL_COMMUNITY)

## 2024-02-02 ENCOUNTER — Encounter (HOSPITAL_COMMUNITY)

## 2024-02-05 ENCOUNTER — Encounter (HOSPITAL_COMMUNITY)

## 2024-02-07 ENCOUNTER — Encounter (HOSPITAL_COMMUNITY)

## 2024-02-09 ENCOUNTER — Encounter (HOSPITAL_COMMUNITY)

## 2024-02-12 ENCOUNTER — Encounter (HOSPITAL_COMMUNITY)

## 2024-02-14 ENCOUNTER — Encounter (HOSPITAL_COMMUNITY)

## 2024-02-16 ENCOUNTER — Encounter (HOSPITAL_COMMUNITY)

## 2024-02-19 ENCOUNTER — Encounter (HOSPITAL_COMMUNITY)

## 2024-02-21 ENCOUNTER — Encounter (HOSPITAL_COMMUNITY)

## 2024-02-21 NOTE — Progress Notes (Unsigned)
 "  Cardiology Office Note    Date:  02/21/2024  ID:  ALAIN DESCHENE, DOB May 27, 1945, MRN [id number] PCP:  Sheldon Netter, PA  Cardiologist:  Alm Clay, MD  Electrophysiologist:  None   Chief Complaint: ***  History of Present Illness: Carl [name] is a 79 y.o. male with visit-pertinent history of *** typical atrial flutter, paroxysmal atrial fibrillation, chronic anticoagulation with Eliquis , CAD s/p CABG x 4 with LIMA to LAD, SVG to OM, SVG to diagonal circumflex, SVG to PDA in 2019, chronic diastolic heart failure, hypertension, hyperlipidemia, hypothyroidism and OSA.  Patient was previously followed by Dr. Burnard, has now transitioned care to Dr. Clay.   Patient's amiodarone  previously discontinued due to elevated LFTs and elevated TSH level. Patient underwent DCCV in 11/2018 however continued to have episodes of atrial flutter.  Patient underwent atrial flutter ablation with Dr. Waddell in December 2020.   Patient presented to the emergency room on 08/30/2023 with concerns regarding atrial fibrillation but left after not being seen by the MD.  His EKG showed A-fib with a rate of 117 bpm with aberrantly conducted beats versus PVCs.   Patient was seen by Dr. Clay on 10/17/2023 for progressive shortness of breath.  Patient reported that he noted since summer initially attributing it to the hot weather however would come completely out of breath after walking short distances.  He denies chest pain but reported very mild occasional indigestion-like symptoms when walking.  Cardiac catheterization was recommended.   On 10/25/2023 patient underwent cardiac catheterization, noted to have patent LIMA to LAD, sequential SVG to OM1-OM3 with atretic SVG to RPDA.  Patient had successful PCI/DES x 1 covering 3 lesions in the RCA with plan for triple therapy with aspirin , Plavix  and Eliquis  for 2 weeks then stopping aspirin  with continuation of Plavix  and Eliquis  for at least 6 months.  Is recommended  that patient take his Lasix  daily, noted to have a rate related left bundle branch block in the Cath Lab with recommendation to reduce beta-blocker dose in order to increase afterload reduction in outpatient setting.   On 11/01/2023 patient reported that he was having increased fatigue and shortness of breath, reported that he had not been taking any Lasix .  Patient was instructed to start Lasix  40 mg in the morning and 20 in the evening daily.   Patient was last in clinic on 11/08/2023 for follow-up.  He reportedly been doing well, denies chest pain.  He reported that following his catheterization he did have some worsening shortness of breath, reported he was not taking his Lasix , after resuming Lasix  he reported shortness breath at complete resolved.  He denied any increased lower extremity edema, thought or PND.  He denied any palpitations, racing presyncope.  He reported that he is getting back to his regular activity and was feeling about a centimeter significantly long time.  Patient noted multiple medications he was unsure if he was taking, requested that he notify the office following his office visit, does not appear this was completed.   On chart review patient presented to [facility] the ED via EMS after sliding off of a chair at home and landing on the floor, per notes he was unable to get himself up, his wife called EMS in the morning.  Per notes from ED workup overall reassuring and patient was ambulatory in the ED without issue.  Echocardiogram on 01/25/2024 indicated LVEF 55 to 60%, no RWMA, G1 DD, RV systolic function and size  normal, LA mildly dilated, trivial mitral regurgitation with no evidence of stenosis, aortic valve regurgitation mild to moderate, aortic valve sclerosis present without evidence of stenosis.   Today he presents for follow-up.  He reports that he     CAD: s/p CABG x 4 in 2019.  Patient with nuclear stress in 2020 that was reassuring.  Patient underwent cardiac  catheterization in setting of progressive dyspnea on 10/25/2023 with Dr. Anner, noted to have patent LIMA to LAD, sequential SVG to OM1-OM3 with atretic SVG to RPDA.  Patient had successful PCI/DES x 1 covering 3 lesions in the RCA with plan for triple therapy with aspirin , Plavix  and Eliquis  for 2 weeks then stopping aspirin  with continuation of Plavix  and Eliquis  for at least 6 months.  Today he reports that he  Reviewed ED precautions. Continue Plavix  75 mg daily and Eliquis  5 mg twice daily.   Chronic diastolic heart failure: Echocardiogram on 08/02/2021 indicated LVEF 65 to 70%, no RWMA, diastolic parameters were indeterminate, RV systolic function and size was normal, mildly elevated pulmonary artery systolic pressures, LA was mildly dilated, RA was moderately dilated, mild mitral valve regurgitation.  Echo on   PAF/atrial flutter: S/p flutter ablation.  Patient previously intolerant to amiodarone  due to elevated LFTs and TSH.   Today he reports that he    Hypertension: Blood pressure today  Continue    Hyperlipidemia: Last lipid profile on   OSA:   Labwork independently reviewed:   ROS: .   *** denies chest pain, shortness of breath, lower extremity edema, fatigue, palpitations, melena, hematuria, hemoptysis, diaphoresis, weakness, presyncope, syncope, orthopnea, and PND.  All other systems are reviewed and otherwise negative.  Studies Reviewed: Carl    EKG:  EKG is ordered today, personally reviewed, demonstrating ***     CV Studies: Cardiac studies reviewed are outlined and summarized above. Otherwise please see EMR for full report. Cardiac Studies & Procedures   ______________________________________________________________________________________________ CARDIAC CATHETERIZATION  CARDIAC CATHETERIZATION 10/25/2023  Conclusion Table formatting from the original result was not included. Images from the original result were not included.    CULPRIT LESION: Prox RCA to Mid  RCA lesion is 70% stenosed.  Mid RCA-1 lesion is 80% stenosed. Mid RCA-2 lesion is 60% stenosed.  (SVG to RPDA is atretic)   A drug-eluting stent was successfully placed using a STENT SYNERGY XD 3.0X32.  The proximal 28 mm postdilated to 4.1 mm with the distal segment postdilated 3.5 mm.  Post intervention, there is a 0% residual stenosis.  TIMI-3 flow maintained   A 2ND stent was successfully placed overlapping the stent distally to ensure coverage of the final 60% stenosis, using a STENT SYNERGY XD 3.0X8.  This was postdilated at high atmospheres to 3.6 mm. Post intervention, there is a 0% residual stenosis.  TIMI-3 flow maintained   -------------------------------------------------------   Dist LM to Ost LAD lesion is 70% stenosed.  Prox LAD to Mid LAD lesion is 75% stenosed.   Ost 1st Mrg to 1st Mrg lesion is 75% stenosed.  Small caliber 2nd Mrg lesion is 80% stenosed. Ost 2nd Mrg lesion is 70% stenosed. Dist Cx lesion is 100% stenosed.   -----------------3/4 Patent GRAFTS-----------------------------------   LIMA-dLAD graft was visualized by angiography and is normal in caliber.  The graft exhibits no disease. There is competitive flow.   Seq SVG- SVG-OM1-OM3 graft was visualized by angiography and is large.  The graft exhibits no disease.  The flow is not reversed.  There is no competitive flow  SVG-rPDA graft was visualized by angiography and is small.  The graft exhibits severe diffuse disease.  Origin lesion is 90% stenosed. Prox Graft to Insertion lesion is 60% stenosed.   ----------------------- HEMODYNAMICS ---------------------------   The left ventricular systolic function is normal.  The left ventricular ejection fraction is greater than 65% by visual estimate.  There is no aortic valve stenosis.   Hemodynamic findings consistent with mild pulmonary hypertension.  Mean PAP 25 mL of mercury with LVEDP of 24 mmHg and PCWP of 22 mm..   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   Normal Fick Cardiac Output and Index: 6.48-3.08  Diagnostic Dominance: Right     Intervention   RECOMMENDATIONS   In the absence of any other complications or medical issues, we expect the patient to be ready for discharge from an interventional cardiology perspective on 10/25/2023.   Ensure that the patient is taking his Lasix  daily; With rate related left bundle branch block in the Cath Lab, will likely reduce beta-blocker dose in order to increase afterload reduction.  This can be done in the outpatient setting.   Recommend to resume Apixaban , at currently prescribed dose and frequency on 10/25/2023.   Recommend concurrent antiplatelet therapy of Aspirin  81 mg for 1 month and Clopidogrel  75mg  daily for 6 months .   Will actually stop aspirin  at 2 weeks, and continue Plavix  for 6 months along with Eliquis .3    Alm Clay, MD  Findings Coronary Findings Diagnostic  Dominance: Right  Left Main Dist LM to Ost LAD lesion is 70% stenosed.  Left Anterior Descending There is mild diffuse disease throughout the vessel. Prox LAD to Mid LAD lesion is 75% stenosed.  Left Circumflex Dist Cx lesion is 100% stenosed.  First Obtuse Marginal Branch Ost 1st Mrg to 1st Mrg lesion is 75% stenosed.  Second Obtuse Marginal Branch Ost 2nd Mrg lesion is 70% stenosed. 2nd Mrg lesion is 80% stenosed.  Third Obtuse Marginal Branch Vessel is large in size.  Left Posterior Atrioventricular Artery Vessel is small in size.  Right Coronary Artery Prox RCA to Mid RCA lesion is 70% stenosed. The lesion is type B2, segmental and eccentric. The lesion is mildly calcified. Optical coherence tomography (OCT) was performed. Minimum lumen area: 1.81 mm. Minimum stent area: 9.07 mm. Mid RCA-1 lesion is 80% stenosed. Mid RCA-2 lesion is 60% stenosed.  Acute Marginal  Branch Vessel is small in size.  LIMA LIMA Graft To Mid LAD LIMA graft was visualized by angiography and is normal in caliber.  The graft exhibits no disease. There is competitive flow.  Sequential Jump Graft Graft To 1st Mrg, 3rd Mrg Seq SVG- SVG-OM1-OM3 graft was visualized by angiography and is large.  The graft exhibits no disease. The flow is not reversed. There is no competitive flow  Saphenous Graft To Ost RPDA SVG graft was visualized by angiography and is small.  The graft exhibits severe diffuse disease. The graft is atretic. Origin lesion is 90% stenosed. Prox Graft to Insertion lesion is 60% stenosed.  Intervention  Prox RCA to Mid RCA lesion Stent (Also treats lesions: Mid RCA-1) Lesion length:  34 mm. CATH VISTA GUIDE 6FR JR4 ECOPK guide catheter was inserted. Lesion crossed with guidewire using a WIRE ASAHI PROWATER 180CM. Pre-stent angioplasty was not performed. A drug-eluting stent was successfully placed using  a STENT SYNERGY XD 3.0X32. Maximum pressure: 16 atm. Inflation time: 30 sec. Stent strut is well apposed. Post-stent angioplasty was performed using a BALLOON SAPPHIRE NC24 4.0X18. Maximum pressure:  20 atm. Inflation time:  20 sec. 2 inflations to cover the proximal 28 mm of the stent During present appointment, the patient took a deep breath in the catheter pullback bringing the stent back proximally roughly 4 mm.  This caused us  to not fully cover the distal 60% lesion.  A second stent will be placed. Stent (Also treats lesions: Mid RCA-1, and Mid RCA-2) A stent was successfully placed using a STENT SYNERGY XD 3.0X8. Maximum pressure: 16 atm. Inflation time: 20 sec. Stent strut is well apposed. Stent overlaps previously placed stent. Post-stent angioplasty was performed using a BALLOON [place] EMERGE MR 3.5X6. Maximum pressure:  20 atm. Inflation time:  20 sec. 3 inflations to ensure the distal edge and adequate inflation in the overlap segment was  achieved. Post-Intervention Lesion Assessment The intervention was successful. Pre-interventional TIMI flow is 3. Post-intervention TIMI flow is 3. Treated lesion length:  38 mm. No complications occurred at this lesion. Optical coherence tomography (OCT) was performed. Minimum stent area 9.07 mm. OCT supply: CATH DRAGONFLY OPSTAR. There is a 0% residual stenosis post intervention.  Mid RCA-1 lesion Stent (Also treats lesions: Prox RCA to Mid RCA) See details in Prox RCA to Mid RCA lesion. Stent (Also treats lesions: Prox RCA to Mid RCA, and Mid RCA-2) See details in Prox RCA to Mid RCA lesion. Post-Intervention Lesion Assessment The intervention was successful. Pre-interventional TIMI flow is 3. Post-intervention TIMI flow is 3. No complications occurred at this lesion. There is a 0% residual stenosis post intervention.  Mid RCA-2 lesion Stent A stent was successfully placed. Stent (Also treats lesions: Prox RCA to Mid RCA, and Mid RCA-1) See details in Prox RCA to Mid RCA lesion. Post-Intervention Lesion Assessment The intervention was successful. Pre-interventional TIMI flow is 3. Post-intervention TIMI flow is 3. No complications occurred at this lesion. There is a 0% residual stenosis post intervention.   CARDIAC CATHETERIZATION  CARDIAC CATHETERIZATION 11/15/2017  Conclusion  Prox RCA to Mid RCA lesion is 60% stenosed.  Mid RCA lesion is 50% stenosed.  Prox Cx to Mid Cx lesion is 100% stenosed.  Ost 2nd Mrg lesion is 80% stenosed.  2nd Mrg lesion is 90% stenosed.  Ost 1st Mrg to 1st Mrg lesion is 50% stenosed.  Dist LM to Ost LAD lesion is 85% stenosed.  Prox LAD to Mid LAD lesion is 75% stenosed.  Severe multivessel CAD with 85% ostial LAD stenosis, diffuse 75% mid LAD stenosis; total occlusion of the mid AV groove circumflex after the takeoff of the second obtuse marginal vessel with 50% diffuse stenosis in the first large marginal branch, 80 and 90% stenoses in  the second obtuse marginal and extensive collateralization to the distal circumflex and third marginal via the LAD circulation; 60 and 50% mid RCA stenoses in a dominant RCA.  LVEDP 12 mm.  Previous echo documentation of hyperdynamic LV function with an EF of 65 to 70%.  Suspect high right radial artery take-off with stenosis/spasm above the elbow and retrograde filling of brachial artery  RECOMMENDATION: Patient will be admitted following his cardiac catheterization.  I have discussed the catheterization findings with Dr. Army who will proceed with CABG revascularization surgery tomorrow.  Findings Coronary Findings Diagnostic  Dominance: Right  Left Main Dist LM to Ost LAD lesion is 85% stenosed.  Left Anterior  Descending Prox LAD to Mid LAD lesion is 75% stenosed.  Left Circumflex Prox Cx to Mid Cx lesion is 100% stenosed.  First Obtuse Marginal Branch Ost 1st Mrg to 1st Mrg lesion is 50% stenosed.  Second Obtuse Marginal Branch Ost 2nd Mrg lesion is 80% stenosed. 2nd Mrg lesion is 90% stenosed.  Third Obtuse Marginal Branch Collaterals 3rd Mrg filled by collaterals from 3rd Sept.  Right Coronary Artery Prox RCA to Mid RCA lesion is 60% stenosed. Mid RCA lesion is 50% stenosed.  Intervention  No interventions have been documented.   STRESS TESTS  MYOCARDIAL PERFUSION IMAGING 01/24/2019  Interpretation Summary  The left ventricular ejection fraction is normal (55-65%).  Nuclear stress EF: 60%.  There was no ST segment deviation noted during stress.  No T wave inversion was noted during stress.  The study is normal.  This is a low risk study.  1.  Normal myocardial perfusion imaging study without evidence of ischemia or infarction. 2.  Normal left ventricular ejection fraction, 60%. 3.  Abnormal septal motion consistent with a postoperative state. 4.  This is a low risk study.  Darryle T. Barbaraann, MD [facility] [address], [address] [place], [place] [address] [phone] 4:50 PM   ECHOCARDIOGRAM  ECHOCARDIOGRAM COMPLETE 01/25/2024  Narrative ECHOCARDIOGRAM REPORT    Patient Name:   Carl [name]   Date of Exam: 01/25/2024 Medical Rec #:  [id number]     Height:       68.0 in Accession #:    [id number]    Weight:       218.2 lb Date of Birth:  [birth year]      BSA:          2.121 m Patient Age:    78 years      BP:           114/68 mmHg Patient Gender: M             HR:           59 bpm. Exam Location:  [address]  Procedure: 2D Echo (Both Spectral and Color Flow Doppler were utilized during procedure).  Indications:    R07.9* Chest pain, unspecified; I25.10 Coronary artery disease - Native Vessel  History:        Patient has prior history of Echocardiogram examinations, most recent 08/02/2021. Cardiomyopathy, Prior CABG, TIA, Arrythmias:Atrial Fibrillation and Atrial Flutter; Risk Factors:Hypertension, Dyslipidemia and Sleep Apnea.  Sonographer:    Jon Hacker RCS Referring Phys: [phone] DAVID W HARDING  IMPRESSIONS   1. Left ventricular ejection fraction, by estimation, is 55 to 60%. The left ventricle has normal function. The left ventricle has no regional wall motion abnormalities. Left ventricular diastolic parameters are consistent with Grade I diastolic dysfunction (impaired relaxation). 2. Right ventricular systolic function is normal. The right ventricular size is normal. 3. Left atrial size was mildly dilated. 4. The mitral valve is normal in structure. Trivial mitral valve regurgitation. No evidence of mitral stenosis. 5. The aortic valve is tricuspid. Aortic valve regurgitation is mild to moderate. Aortic valve sclerosis is present, with no evidence of aortic valve stenosis. 6. The inferior vena cava is normal in size with greater than 50% respiratory variability, suggesting right atrial pressure of 3 mmHg.  FINDINGS Left Ventricle: Left ventricular ejection fraction, by  estimation, is 55 to 60%. The left ventricle has normal function. The left ventricle has no regional wall motion abnormalities. The left ventricular internal cavity size  was normal in size. There is no left ventricular hypertrophy. Left ventricular diastolic parameters are consistent with Grade I diastolic dysfunction (impaired relaxation).  Right Ventricle: The right ventricular size is normal. No increase in right ventricular wall thickness. Right ventricular systolic function is normal.  Left [facility]: Left atrial size was mildly dilated.  Right [facility]: Right atrial size was normal in size.  Pericardium: There is no evidence of pericardial effusion.  Mitral Valve: The mitral valve is normal in structure. Trivial mitral valve regurgitation. No evidence of mitral valve stenosis.  Tricuspid Valve: The tricuspid valve is normal in structure. Tricuspid valve regurgitation is mild . No evidence of tricuspid stenosis.  Aortic Valve: The aortic valve is tricuspid. Aortic valve regurgitation is mild to moderate. Aortic valve sclerosis is present, with no evidence of aortic valve stenosis.  Pulmonic Valve: The pulmonic valve was normal in structure. Pulmonic valve regurgitation is not visualized. No evidence of pulmonic stenosis.  Aorta: The aortic root is normal in size and structure.  Venous: The inferior vena cava is normal in size with greater than 50% respiratory variability, suggesting right atrial pressure of 3 mmHg.  IAS/Shunts: No atrial level shunt detected by color flow Doppler.   LEFT VENTRICLE PLAX 2D LVIDd:         4.53 cm   Diastology LVIDs:         3.15 cm   LV e' medial:    7.83 cm/s LV PW:         1.10 cm   LV E/e' medial:  13.3 LV IVS:        1.06 cm   LV e' lateral:   9.90 cm/s LVOT diam:     2.00 cm   LV E/e' lateral: 10.5 LV SV:         79 LV SV Index:   37 LVOT Area:     3.14 cm   RIGHT VENTRICLE RV S prime:     8.49 cm/s  PULMONARY VEINS TAPSE (M-mode): 1.5 cm      Diastolic Velocity: 33.60 cm/s S/D Velocity:       1.50 Systolic Velocity:  49.70 cm/s  LEFT ATRIUM             Index        RIGHT ATRIUM           Index LA diam:        4.40 cm 2.07 cm/m   RA Area:     20.80 cm LA Vol (A2C):   94.0 ml 44.32 ml/m  RA Volume:   58.30 ml  27.49 ml/m LA Vol (A4C):   56.3 ml 26.55 ml/m LA Biplane Vol: 74.7 ml 35.22 ml/m AORTIC VALVE LVOT Vmax:   115.00 cm/s LVOT Vmean:  75.500 cm/s LVOT VTI:    0.251 m  AORTA Ao Root diam: 3.20 cm Ao Asc diam:  3.40 cm  MITRAL VALVE                TRICUSPID VALVE MV Area (PHT): 3.63 cm     TR Peak grad:   27.2 mmHg MV Decel Time: 209 msec     TR Vmax:        261.00 cm/s MV E velocity: 104.00 cm/s MV A velocity: 81.20 cm/s   SHUNTS MV E/A ratio:  1.28         Systemic VTI:  0.25 m Systemic Diam: 2.00 cm  Morene Brownie Electronically signed by Morene Brownie Signature Date/Time: 01/25/2024/8:57:49 PM  Final   TEE  ECHO TEE 11/16/2017  Interpretation Summary  Septum: No Patent Foramen Ovale present.  Left atrium: Patent foramen ovale not present.  Aortic valve: The valve is trileaflet. Mild valve thickening present. No stenosis. Trace regurgitation.  Tricuspid valve: Mild regurgitation. The tricuspid valve regurgitation jet is central.  Pulmonic valve: Trace regurgitation.  Mitral valve: Moderate leaflet thickening is present. Mild leaflet calcification is present. Mild mitral annular calcification. Mild regurgitation.  Aorta: The ascending aorta is dilated.  Right ventricle: Normal cavity size, wall thickness and ejection fraction.  Right ventricle: Normal cavity size and ejection fraction.    CT SCANS  CT CORONARY FRACTIONAL FLOW RESERVE DATA PREP 11/09/2017  Narrative CLINICAL DATA:  CAD  EXAM: FFR CT  TECHNIQUE: The best systolic and diastolic phases of the patients gated cardiac CT scan sent to Heart Flow for hemodynamic analysis  FINDINGS: FFR CT positive in mid  RCA .78 mid LAD .78 and AV groove .71  IMPRESSION: Positive FFR CT in mid RCA and mid LAD AV groove branch small and not likely significant  Maude Emmer   Electronically Signed By: Maude Emmer M.D. On: 11/10/2017 09:23   CT CORONARY MORPH W/CTA COR W/SCORE 11/09/2017  Addendum 11/09/2017  4:12 PM ADDENDUM REPORT: 11/09/2017 16:10  CLINICAL DATA:  Chest pain  EXAM: Cardiac CTA  MEDICATIONS: Sub lingual nitro. 4mg  and lopressor  5mg   TECHNIQUE: The patient was scanned on a [employer]. Gantry rotation speed was 270 msecs. Collimation was.9 mm. A 100 kV prospective scan was triggered in the descending thoracic aorta at 111 HU's with 5% padding centered around 78% of the R-R interval. Average HR during the scan was 62 bpm. The 3D data set was interpreted on a dedicated work station using MPR, MIP and VRT modes. A total of 80 cc of contrast was used.  FINDINGS: Non-cardiac: See separate report from [facility]. No significant findings on limited lung and soft tissue windows.  Calcium  Score: Calcium  noted in all 3 major epicardial coronary vessels  Coronary Arteries: Right dominant with no anomalies  LM: Normal  LAD: > 75% ostial and mid vessel calcific plaque. Less than 50% distal calcific plaque  IM: Small vessel not well seen  D1: Greater than 75% calcific plaque proximally and mid vessel  D2: Normal  Circumflex: Less than 50% proximal plaque  OM1: Less than 50% proximal plaque  OM2: 50-75% mixed plaque in AV groove  RCA: 50% calcified plaque proximally 50-75% calcified plaque in mid vessel  PDA: Normal  PLA: Normal  IMPRESSION: 1. Calcium  score 825 which is 78 th percentile for age and sex  2. Obstructive CAD see description above Study will be sent for FFR CT but patient will likely need heart catheterization  3.  Mild aortic root dilatation 4.1 cm  Maude Emmer   Electronically Signed By: Maude Emmer M.D. On:  11/09/2017 16:10  Narrative EXAM: OVER-READ INTERPRETATION  CT CHEST  The following report is an over-read performed by radiologist Dr. Franky Crease of [facility] on 11/09/2017. This over-read does not include interpretation of cardiac or coronary anatomy or pathology. The coronary CTA interpretation by the cardiologist is attached.  COMPARISON:  None.  FINDINGS: Vascular: Heart is normal size.  Visualized aorta is normal caliber.  Mediastinum/Nodes: No adenopathy in the lower mediastinum or hila.  Lungs/Pleura: Dependent atelectasis in the lower lobes. No effusions.  Upper Abdomen: Imaging into the upper abdomen shows no acute findings.  Musculoskeletal: Chest wall  soft tissues are unremarkable. No acute bony abnormality.  IMPRESSION: No acute or significant extracardiac abnormality.  Electronically Signed: By: Franky Crease M.D. On: 11/09/2017 15:46     ______________________________________________________________________________________________       Current Reported Medications:.    Active Medications[1]  Physical Exam:    VS:  There were no vitals taken for this visit.   Wt Readings from Last 3 Encounters:  11/08/23 218 lb 3.2 oz (99 kg)  10/25/23 220 lb (99.8 kg)  10/17/23 220 lb 3.2 oz (99.9 kg)    GEN: Well nourished, well developed in no acute distress NECK: No JVD; No carotid bruits CARDIAC: ***RRR, no murmurs, rubs, gallops RESPIRATORY:  Clear to auscultation without rales, wheezing or rhonchi  ABDOMEN: Soft, non-tender, non-distended EXTREMITIES:  No edema; No acute deformity     Asessement and Plan:.     ***  {The patient has an active order for outpatient cardiac rehabilitation.   Please indicate if the patient is ready to start. Do NOT delete this.  It will auto delete.  Refresh note, then sign.              Click here to document readiness and see contraindications.  :1}  Cardiac Rehabilitation Eligibility Assessment       Disposition: F/u with ***  Signed, Samaad Hashem D Ameia Morency, NP      [1]  No outpatient medications have been marked as taking for the 02/22/24 encounter (Appointment) with Lamin Chandley D, NP.   "

## 2024-02-22 ENCOUNTER — Telehealth: Payer: Self-pay | Admitting: Cardiology

## 2024-02-22 ENCOUNTER — Ambulatory Visit: Admitting: Cardiology

## 2024-02-22 NOTE — Telephone Encounter (Signed)
 All of this patients rehab appointments have been canceled and he claims he did not cancel them and wants to know the reason behind that. He would like to speak with someone regarding the issue.

## 2024-02-23 ENCOUNTER — Encounter (HOSPITAL_COMMUNITY)

## 2024-02-26 ENCOUNTER — Encounter (HOSPITAL_COMMUNITY)

## 2024-02-28 ENCOUNTER — Encounter (HOSPITAL_COMMUNITY)

## 2024-03-01 ENCOUNTER — Encounter (HOSPITAL_COMMUNITY)

## 2024-03-04 ENCOUNTER — Encounter (HOSPITAL_COMMUNITY)

## 2024-03-06 ENCOUNTER — Encounter (HOSPITAL_COMMUNITY)

## 2024-03-08 ENCOUNTER — Encounter (HOSPITAL_COMMUNITY)

## 2024-03-11 ENCOUNTER — Encounter (HOSPITAL_COMMUNITY)

## 2024-03-13 ENCOUNTER — Encounter (HOSPITAL_COMMUNITY)

## 2024-03-13 ENCOUNTER — Telehealth (HOSPITAL_COMMUNITY): Payer: Self-pay

## 2024-03-13 NOTE — Telephone Encounter (Signed)
 Called patient to see if he was interested in rescheduling the Cardiac Rehab Program. Patient will come in for orientation on 2/12 and will attend the 1:45 exercise class.  Sent MyChart message.

## 2024-03-15 ENCOUNTER — Telehealth (HOSPITAL_COMMUNITY): Payer: Self-pay

## 2024-03-15 ENCOUNTER — Encounter (HOSPITAL_COMMUNITY)

## 2024-03-15 NOTE — Telephone Encounter (Signed)
 Pt insurance is active and benefits verified through [employer]. Co-pay $25, DED $0/$0 met, out of pocket $3,900/$0 met, co-insurance 0%. No pre-authorization required. 03/15/2024 @ 11:57am, spoke with Alyssa, REF# [id number].  TCR/ICR? ICR Visit(date of service)limitation? No Can multiple codes be used on the same date of service/visit?(IF ITS A LIMIT) N/A  Is this a lifetime maximum or an annual maximum? Annual Has the member used any of these services to date? No Is there a time limit (weeks/months) on start of program and/or program completion? No

## 2024-03-18 ENCOUNTER — Encounter (HOSPITAL_COMMUNITY)

## 2024-03-20 ENCOUNTER — Encounter (HOSPITAL_COMMUNITY)

## 2024-03-21 ENCOUNTER — Encounter (HOSPITAL_COMMUNITY)

## 2024-03-25 ENCOUNTER — Encounter (HOSPITAL_COMMUNITY)

## 2024-03-27 ENCOUNTER — Encounter (HOSPITAL_COMMUNITY)

## 2024-03-29 ENCOUNTER — Encounter (HOSPITAL_COMMUNITY)

## 2024-04-01 ENCOUNTER — Encounter (HOSPITAL_COMMUNITY)

## 2024-04-03 ENCOUNTER — Encounter (HOSPITAL_COMMUNITY)

## 2024-04-04 ENCOUNTER — Ambulatory Visit: Admitting: Cardiology

## 2024-04-05 ENCOUNTER — Encounter (HOSPITAL_COMMUNITY)

## 2024-04-08 ENCOUNTER — Encounter (HOSPITAL_COMMUNITY)

## 2024-04-10 ENCOUNTER — Encounter (HOSPITAL_COMMUNITY)

## 2024-04-12 ENCOUNTER — Encounter (HOSPITAL_COMMUNITY)

## 2024-04-15 ENCOUNTER — Encounter (HOSPITAL_COMMUNITY)

## 2024-04-17 ENCOUNTER — Encounter (HOSPITAL_COMMUNITY)

## 2024-04-19 ENCOUNTER — Encounter (HOSPITAL_COMMUNITY)

## 2024-04-22 ENCOUNTER — Encounter (HOSPITAL_COMMUNITY)

## 2024-04-24 ENCOUNTER — Encounter (HOSPITAL_COMMUNITY)

## 2024-04-26 ENCOUNTER — Encounter (HOSPITAL_COMMUNITY)

## 2024-04-29 ENCOUNTER — Encounter (HOSPITAL_COMMUNITY)

## 2024-05-01 ENCOUNTER — Encounter (HOSPITAL_COMMUNITY)

## 2024-05-03 ENCOUNTER — Encounter (HOSPITAL_COMMUNITY)

## 2024-05-06 ENCOUNTER — Encounter (HOSPITAL_COMMUNITY)

## 2024-05-08 ENCOUNTER — Encounter (HOSPITAL_COMMUNITY)

## 2024-05-10 ENCOUNTER — Encounter (HOSPITAL_COMMUNITY)

## 2024-05-13 ENCOUNTER — Encounter (HOSPITAL_COMMUNITY)

## 2024-05-15 ENCOUNTER — Encounter (HOSPITAL_COMMUNITY)

## 2024-05-17 ENCOUNTER — Encounter (HOSPITAL_COMMUNITY)

## 2024-05-20 ENCOUNTER — Encounter (HOSPITAL_COMMUNITY)

## 2024-05-22 ENCOUNTER — Encounter (HOSPITAL_COMMUNITY)

## 2024-05-24 ENCOUNTER — Encounter (HOSPITAL_COMMUNITY)

## 2024-05-27 ENCOUNTER — Encounter (HOSPITAL_COMMUNITY)

## 2024-05-29 ENCOUNTER — Encounter (HOSPITAL_COMMUNITY)

## 2024-05-31 ENCOUNTER — Encounter (HOSPITAL_COMMUNITY)

## 2024-06-03 ENCOUNTER — Encounter (HOSPITAL_COMMUNITY)

## 2024-06-05 ENCOUNTER — Encounter (HOSPITAL_COMMUNITY)

## 2024-06-07 ENCOUNTER — Encounter (HOSPITAL_COMMUNITY)

## 2024-06-10 ENCOUNTER — Encounter (HOSPITAL_COMMUNITY)

## 2024-06-12 ENCOUNTER — Encounter (HOSPITAL_COMMUNITY)
